# Patient Record
Sex: Female | Born: 1970 | Race: Black or African American | Hispanic: No | Marital: Married | Smoking: Never smoker
Health system: Southern US, Community
[De-identification: ages and names within clinical notes are randomized; demographics above are authoritative.]

## PROBLEM LIST (undated history)

## (undated) DIAGNOSIS — K589 Irritable bowel syndrome without diarrhea: Secondary | ICD-10-CM

## (undated) DIAGNOSIS — M329 Systemic lupus erythematosus, unspecified: Secondary | ICD-10-CM

## (undated) DIAGNOSIS — R51 Headache: Secondary | ICD-10-CM

## (undated) DIAGNOSIS — R531 Weakness: Secondary | ICD-10-CM

## (undated) DIAGNOSIS — F419 Anxiety disorder, unspecified: Secondary | ICD-10-CM

## (undated) DIAGNOSIS — Z973 Presence of spectacles and contact lenses: Secondary | ICD-10-CM

## (undated) DIAGNOSIS — M1990 Unspecified osteoarthritis, unspecified site: Secondary | ICD-10-CM

## (undated) DIAGNOSIS — K219 Gastro-esophageal reflux disease without esophagitis: Secondary | ICD-10-CM

## (undated) DIAGNOSIS — R7989 Other specified abnormal findings of blood chemistry: Secondary | ICD-10-CM

## (undated) DIAGNOSIS — R011 Cardiac murmur, unspecified: Secondary | ICD-10-CM

## (undated) DIAGNOSIS — M2550 Pain in unspecified joint: Secondary | ICD-10-CM

## (undated) DIAGNOSIS — D509 Iron deficiency anemia, unspecified: Secondary | ICD-10-CM

## (undated) DIAGNOSIS — I493 Ventricular premature depolarization: Secondary | ICD-10-CM

## (undated) DIAGNOSIS — I10 Essential (primary) hypertension: Secondary | ICD-10-CM

## (undated) DIAGNOSIS — Z87442 Personal history of urinary calculi: Secondary | ICD-10-CM

## (undated) HISTORY — DX: Gastro-esophageal reflux disease without esophagitis: K21.9

## (undated) HISTORY — PX: TUBAL LIGATION: SHX77

## (undated) HISTORY — DX: Ventricular premature depolarization: I49.3

## (undated) HISTORY — PX: OVARIAN CYST SURGERY: SHX726

## (undated) HISTORY — DX: Essential (primary) hypertension: I10

## (undated) HISTORY — DX: Unspecified osteoarthritis, unspecified site: M19.90

## (undated) HISTORY — DX: Other specified abnormal findings of blood chemistry: R79.89

## (undated) HISTORY — DX: Headache: R51

## (undated) HISTORY — DX: Anxiety disorder, unspecified: F41.9

## (undated) HISTORY — PX: OTHER SURGICAL HISTORY: SHX169

## (undated) HISTORY — DX: Cardiac murmur, unspecified: R01.1

## (undated) HISTORY — PX: ESOPHAGOGASTRODUODENOSCOPY: SHX1529

---

## 1994-04-04 HISTORY — PX: LAPAROSCOPIC ABDOMINAL EXPLORATION: SHX6249

## 1997-12-16 ENCOUNTER — Other Ambulatory Visit: Admission: RE | Admit: 1997-12-16 | Discharge: 1997-12-16 | Payer: Self-pay | Admitting: Obstetrics and Gynecology

## 1998-07-05 ENCOUNTER — Emergency Department (HOSPITAL_COMMUNITY): Admission: EM | Admit: 1998-07-05 | Discharge: 1998-07-05 | Payer: Self-pay | Admitting: Emergency Medicine

## 1998-08-08 ENCOUNTER — Emergency Department (HOSPITAL_COMMUNITY): Admission: EM | Admit: 1998-08-08 | Discharge: 1998-08-08 | Payer: Self-pay | Admitting: Emergency Medicine

## 1998-08-08 ENCOUNTER — Encounter: Payer: Self-pay | Admitting: Emergency Medicine

## 1998-09-17 ENCOUNTER — Observation Stay (HOSPITAL_COMMUNITY): Admission: AD | Admit: 1998-09-17 | Discharge: 1998-09-18 | Payer: Self-pay | Admitting: Obstetrics and Gynecology

## 1998-09-25 ENCOUNTER — Encounter: Payer: Self-pay | Admitting: Obstetrics and Gynecology

## 1998-09-25 ENCOUNTER — Inpatient Hospital Stay (HOSPITAL_COMMUNITY): Admission: AD | Admit: 1998-09-25 | Discharge: 1998-09-25 | Payer: Self-pay | Admitting: Obstetrics & Gynecology

## 1998-10-01 ENCOUNTER — Inpatient Hospital Stay (HOSPITAL_COMMUNITY): Admission: AD | Admit: 1998-10-01 | Discharge: 1998-10-01 | Payer: Self-pay | Admitting: Obstetrics & Gynecology

## 1998-10-02 ENCOUNTER — Inpatient Hospital Stay (HOSPITAL_COMMUNITY): Admission: AD | Admit: 1998-10-02 | Discharge: 1998-10-02 | Payer: Self-pay | Admitting: Obstetrics and Gynecology

## 1998-10-12 ENCOUNTER — Inpatient Hospital Stay (HOSPITAL_COMMUNITY): Admission: AD | Admit: 1998-10-12 | Discharge: 1998-10-18 | Payer: Self-pay | Admitting: Obstetrics & Gynecology

## 1999-02-04 ENCOUNTER — Inpatient Hospital Stay (HOSPITAL_COMMUNITY): Admission: AD | Admit: 1999-02-04 | Discharge: 1999-02-04 | Payer: Self-pay | Admitting: Obstetrics and Gynecology

## 1999-03-19 ENCOUNTER — Inpatient Hospital Stay (HOSPITAL_COMMUNITY): Admission: AD | Admit: 1999-03-19 | Discharge: 1999-03-22 | Payer: Self-pay | Admitting: Obstetrics and Gynecology

## 1999-03-23 ENCOUNTER — Encounter: Admission: RE | Admit: 1999-03-23 | Discharge: 1999-06-21 | Payer: Self-pay | Admitting: Obstetrics and Gynecology

## 1999-04-22 ENCOUNTER — Other Ambulatory Visit: Admission: RE | Admit: 1999-04-22 | Discharge: 1999-04-22 | Payer: Self-pay | Admitting: Obstetrics and Gynecology

## 1999-05-13 ENCOUNTER — Inpatient Hospital Stay (HOSPITAL_COMMUNITY): Admission: AD | Admit: 1999-05-13 | Discharge: 1999-05-13 | Payer: Self-pay | Admitting: Obstetrics and Gynecology

## 1999-05-14 ENCOUNTER — Encounter (HOSPITAL_COMMUNITY): Admission: RE | Admit: 1999-05-14 | Discharge: 1999-08-12 | Payer: Self-pay | Admitting: Obstetrics and Gynecology

## 1999-07-22 ENCOUNTER — Encounter: Admission: RE | Admit: 1999-07-22 | Discharge: 1999-10-15 | Payer: Self-pay | Admitting: Obstetrics and Gynecology

## 2000-06-15 ENCOUNTER — Other Ambulatory Visit: Admission: RE | Admit: 2000-06-15 | Discharge: 2000-06-15 | Payer: Self-pay | Admitting: Obstetrics and Gynecology

## 2001-07-02 ENCOUNTER — Other Ambulatory Visit: Admission: RE | Admit: 2001-07-02 | Discharge: 2001-07-02 | Payer: Self-pay | Admitting: Obstetrics and Gynecology

## 2001-08-13 ENCOUNTER — Encounter: Admission: RE | Admit: 2001-08-13 | Discharge: 2001-08-13 | Payer: Self-pay | Admitting: Urology

## 2001-08-13 ENCOUNTER — Encounter: Payer: Self-pay | Admitting: Urology

## 2001-10-26 ENCOUNTER — Encounter: Payer: Self-pay | Admitting: Obstetrics and Gynecology

## 2001-10-26 ENCOUNTER — Inpatient Hospital Stay (HOSPITAL_COMMUNITY): Admission: AD | Admit: 2001-10-26 | Discharge: 2001-10-26 | Payer: Self-pay | Admitting: Obstetrics and Gynecology

## 2001-10-29 ENCOUNTER — Inpatient Hospital Stay (HOSPITAL_COMMUNITY): Admission: AD | Admit: 2001-10-29 | Discharge: 2001-10-29 | Payer: Self-pay | Admitting: Obstetrics and Gynecology

## 2001-11-05 ENCOUNTER — Encounter: Payer: Self-pay | Admitting: Obstetrics and Gynecology

## 2001-11-06 ENCOUNTER — Inpatient Hospital Stay (HOSPITAL_COMMUNITY): Admission: AD | Admit: 2001-11-06 | Discharge: 2001-11-09 | Payer: Self-pay | Admitting: Obstetrics and Gynecology

## 2001-11-27 ENCOUNTER — Observation Stay (HOSPITAL_COMMUNITY): Admission: AD | Admit: 2001-11-27 | Discharge: 2001-11-27 | Payer: Self-pay | Admitting: Obstetrics and Gynecology

## 2001-11-30 ENCOUNTER — Observation Stay (HOSPITAL_COMMUNITY): Admission: AD | Admit: 2001-11-30 | Discharge: 2001-12-01 | Payer: Self-pay | Admitting: Obstetrics and Gynecology

## 2001-12-11 ENCOUNTER — Inpatient Hospital Stay (HOSPITAL_COMMUNITY): Admission: AD | Admit: 2001-12-11 | Discharge: 2001-12-17 | Payer: Self-pay | Admitting: Obstetrics and Gynecology

## 2002-04-01 ENCOUNTER — Ambulatory Visit (HOSPITAL_COMMUNITY): Admission: RE | Admit: 2002-04-01 | Discharge: 2002-04-01 | Payer: Self-pay | Admitting: Obstetrics and Gynecology

## 2002-04-01 ENCOUNTER — Encounter: Payer: Self-pay | Admitting: Obstetrics and Gynecology

## 2002-04-19 ENCOUNTER — Encounter: Payer: Self-pay | Admitting: Obstetrics and Gynecology

## 2002-04-19 ENCOUNTER — Inpatient Hospital Stay (HOSPITAL_COMMUNITY): Admission: AD | Admit: 2002-04-19 | Discharge: 2002-04-19 | Payer: Self-pay | Admitting: Obstetrics and Gynecology

## 2002-04-20 ENCOUNTER — Inpatient Hospital Stay (HOSPITAL_COMMUNITY): Admission: AD | Admit: 2002-04-20 | Discharge: 2002-04-20 | Payer: Self-pay | Admitting: Obstetrics and Gynecology

## 2002-04-26 ENCOUNTER — Inpatient Hospital Stay (HOSPITAL_COMMUNITY): Admission: AD | Admit: 2002-04-26 | Discharge: 2002-04-26 | Payer: Self-pay | Admitting: Obstetrics and Gynecology

## 2002-04-26 ENCOUNTER — Encounter: Payer: Self-pay | Admitting: Obstetrics and Gynecology

## 2002-04-28 ENCOUNTER — Encounter: Payer: Self-pay | Admitting: Obstetrics and Gynecology

## 2002-04-28 ENCOUNTER — Inpatient Hospital Stay (HOSPITAL_COMMUNITY): Admission: AD | Admit: 2002-04-28 | Discharge: 2002-04-28 | Payer: Self-pay | Admitting: Obstetrics and Gynecology

## 2002-05-03 ENCOUNTER — Inpatient Hospital Stay (HOSPITAL_COMMUNITY): Admission: AD | Admit: 2002-05-03 | Discharge: 2002-05-04 | Payer: Self-pay | Admitting: Obstetrics and Gynecology

## 2002-05-03 ENCOUNTER — Encounter: Payer: Self-pay | Admitting: Obstetrics and Gynecology

## 2002-05-04 ENCOUNTER — Encounter: Payer: Self-pay | Admitting: Obstetrics and Gynecology

## 2002-05-21 ENCOUNTER — Inpatient Hospital Stay (HOSPITAL_COMMUNITY): Admission: AD | Admit: 2002-05-21 | Discharge: 2002-05-21 | Payer: Self-pay | Admitting: Obstetrics and Gynecology

## 2002-05-29 ENCOUNTER — Inpatient Hospital Stay (HOSPITAL_COMMUNITY): Admission: AD | Admit: 2002-05-29 | Discharge: 2002-05-29 | Payer: Self-pay | Admitting: Obstetrics and Gynecology

## 2002-05-31 ENCOUNTER — Inpatient Hospital Stay (HOSPITAL_COMMUNITY): Admission: AD | Admit: 2002-05-31 | Discharge: 2002-06-03 | Payer: Self-pay | Admitting: Obstetrics and Gynecology

## 2002-06-01 ENCOUNTER — Encounter (INDEPENDENT_AMBULATORY_CARE_PROVIDER_SITE_OTHER): Payer: Self-pay

## 2002-06-04 ENCOUNTER — Encounter: Admission: RE | Admit: 2002-06-04 | Discharge: 2002-07-04 | Payer: Self-pay | Admitting: Obstetrics and Gynecology

## 2002-06-05 ENCOUNTER — Inpatient Hospital Stay (HOSPITAL_COMMUNITY): Admission: AD | Admit: 2002-06-05 | Discharge: 2002-06-05 | Payer: Self-pay | Admitting: Obstetrics and Gynecology

## 2002-07-05 ENCOUNTER — Encounter: Admission: RE | Admit: 2002-07-05 | Discharge: 2002-08-04 | Payer: Self-pay | Admitting: Obstetrics and Gynecology

## 2002-08-02 ENCOUNTER — Other Ambulatory Visit: Admission: RE | Admit: 2002-08-02 | Discharge: 2002-08-02 | Payer: Self-pay | Admitting: Obstetrics and Gynecology

## 2002-09-04 ENCOUNTER — Encounter: Admission: RE | Admit: 2002-09-04 | Discharge: 2002-10-04 | Payer: Self-pay | Admitting: Obstetrics and Gynecology

## 2003-10-13 ENCOUNTER — Ambulatory Visit (HOSPITAL_COMMUNITY): Admission: RE | Admit: 2003-10-13 | Discharge: 2003-10-13 | Payer: Self-pay | Admitting: Obstetrics and Gynecology

## 2003-10-13 ENCOUNTER — Encounter (INDEPENDENT_AMBULATORY_CARE_PROVIDER_SITE_OTHER): Payer: Self-pay | Admitting: Specialist

## 2004-02-09 ENCOUNTER — Other Ambulatory Visit: Admission: RE | Admit: 2004-02-09 | Discharge: 2004-02-09 | Payer: Self-pay | Admitting: Obstetrics and Gynecology

## 2006-01-19 ENCOUNTER — Other Ambulatory Visit: Admission: RE | Admit: 2006-01-19 | Discharge: 2006-01-19 | Payer: Self-pay | Admitting: Obstetrics and Gynecology

## 2006-02-27 ENCOUNTER — Encounter: Admission: RE | Admit: 2006-02-27 | Discharge: 2006-02-27 | Payer: Self-pay | Admitting: Internal Medicine

## 2006-03-14 ENCOUNTER — Ambulatory Visit (HOSPITAL_COMMUNITY): Admission: RE | Admit: 2006-03-14 | Discharge: 2006-03-14 | Payer: Self-pay | Admitting: Gastroenterology

## 2006-04-10 ENCOUNTER — Ambulatory Visit (HOSPITAL_COMMUNITY): Admission: RE | Admit: 2006-04-10 | Discharge: 2006-04-10 | Payer: Self-pay | Admitting: Gastroenterology

## 2006-04-10 ENCOUNTER — Encounter (INDEPENDENT_AMBULATORY_CARE_PROVIDER_SITE_OTHER): Payer: Self-pay | Admitting: *Deleted

## 2007-03-20 ENCOUNTER — Emergency Department (HOSPITAL_COMMUNITY): Admission: EM | Admit: 2007-03-20 | Discharge: 2007-03-20 | Payer: Self-pay | Admitting: Emergency Medicine

## 2007-03-25 ENCOUNTER — Emergency Department (HOSPITAL_COMMUNITY): Admission: EM | Admit: 2007-03-25 | Discharge: 2007-03-25 | Payer: Self-pay | Admitting: Emergency Medicine

## 2007-08-28 ENCOUNTER — Encounter: Payer: Self-pay | Admitting: Family Medicine

## 2007-09-11 ENCOUNTER — Encounter: Payer: Self-pay | Admitting: Family Medicine

## 2007-09-14 ENCOUNTER — Encounter: Payer: Self-pay | Admitting: Family Medicine

## 2007-09-19 ENCOUNTER — Encounter: Payer: Self-pay | Admitting: Family Medicine

## 2007-10-02 ENCOUNTER — Ambulatory Visit: Payer: Self-pay | Admitting: Family Medicine

## 2007-10-02 DIAGNOSIS — R002 Palpitations: Secondary | ICD-10-CM | POA: Insufficient documentation

## 2007-10-02 DIAGNOSIS — N840 Polyp of corpus uteri: Secondary | ICD-10-CM | POA: Insufficient documentation

## 2007-10-02 DIAGNOSIS — M1990 Unspecified osteoarthritis, unspecified site: Secondary | ICD-10-CM | POA: Insufficient documentation

## 2007-10-02 DIAGNOSIS — Z8679 Personal history of other diseases of the circulatory system: Secondary | ICD-10-CM | POA: Insufficient documentation

## 2007-10-02 DIAGNOSIS — K219 Gastro-esophageal reflux disease without esophagitis: Secondary | ICD-10-CM | POA: Insufficient documentation

## 2007-10-02 DIAGNOSIS — Z87442 Personal history of urinary calculi: Secondary | ICD-10-CM | POA: Insufficient documentation

## 2007-10-02 DIAGNOSIS — G249 Dystonia, unspecified: Secondary | ICD-10-CM | POA: Insufficient documentation

## 2007-10-02 DIAGNOSIS — D649 Anemia, unspecified: Secondary | ICD-10-CM | POA: Insufficient documentation

## 2007-10-02 DIAGNOSIS — K828 Other specified diseases of gallbladder: Secondary | ICD-10-CM | POA: Insufficient documentation

## 2007-10-02 DIAGNOSIS — R51 Headache: Secondary | ICD-10-CM | POA: Insufficient documentation

## 2007-10-02 DIAGNOSIS — I10 Essential (primary) hypertension: Secondary | ICD-10-CM | POA: Insufficient documentation

## 2007-10-02 DIAGNOSIS — R519 Headache, unspecified: Secondary | ICD-10-CM | POA: Insufficient documentation

## 2007-10-16 ENCOUNTER — Ambulatory Visit: Payer: Self-pay | Admitting: Family Medicine

## 2007-12-25 ENCOUNTER — Ambulatory Visit: Payer: Self-pay | Admitting: Family Medicine

## 2007-12-25 DIAGNOSIS — F39 Unspecified mood [affective] disorder: Secondary | ICD-10-CM | POA: Insufficient documentation

## 2007-12-25 DIAGNOSIS — M79609 Pain in unspecified limb: Secondary | ICD-10-CM | POA: Insufficient documentation

## 2007-12-27 ENCOUNTER — Encounter: Payer: Self-pay | Admitting: Family Medicine

## 2008-01-05 ENCOUNTER — Emergency Department (HOSPITAL_COMMUNITY): Admission: EM | Admit: 2008-01-05 | Discharge: 2008-01-06 | Payer: Self-pay | Admitting: Emergency Medicine

## 2008-01-07 ENCOUNTER — Telehealth: Payer: Self-pay | Admitting: Family Medicine

## 2008-01-08 ENCOUNTER — Ambulatory Visit: Payer: Self-pay | Admitting: Family Medicine

## 2008-01-08 DIAGNOSIS — J069 Acute upper respiratory infection, unspecified: Secondary | ICD-10-CM | POA: Insufficient documentation

## 2008-01-08 DIAGNOSIS — R079 Chest pain, unspecified: Secondary | ICD-10-CM | POA: Insufficient documentation

## 2008-02-01 ENCOUNTER — Ambulatory Visit: Payer: Self-pay | Admitting: Family Medicine

## 2008-02-01 DIAGNOSIS — M940 Chondrocostal junction syndrome [Tietze]: Secondary | ICD-10-CM | POA: Insufficient documentation

## 2008-07-01 ENCOUNTER — Ambulatory Visit: Payer: Self-pay | Admitting: Family Medicine

## 2008-07-01 DIAGNOSIS — M545 Low back pain, unspecified: Secondary | ICD-10-CM | POA: Insufficient documentation

## 2008-07-01 LAB — CONVERTED CEMR LAB
Blood in Urine, dipstick: NEGATIVE
Specific Gravity, Urine: 1.02
pH: 7

## 2009-03-10 ENCOUNTER — Ambulatory Visit: Payer: Self-pay | Admitting: Family Medicine

## 2009-03-10 DIAGNOSIS — K589 Irritable bowel syndrome without diarrhea: Secondary | ICD-10-CM | POA: Insufficient documentation

## 2009-03-24 ENCOUNTER — Encounter: Admission: RE | Admit: 2009-03-24 | Discharge: 2009-03-24 | Payer: Self-pay | Admitting: Family Medicine

## 2009-04-23 ENCOUNTER — Ambulatory Visit: Payer: Self-pay | Admitting: Family Medicine

## 2009-04-28 LAB — CONVERTED CEMR LAB
ALT: 14 units/L (ref 0–35)
Basophils Absolute: 0 10*3/uL (ref 0.0–0.1)
Creatinine, Ser: 0.6 mg/dL (ref 0.4–1.2)
GFR calc non Af Amer: 143.59 mL/min (ref >60)
HDL: 49.8 mg/dL (ref >39.00)
Hemoglobin: 11.6 g/dL — ABNORMAL LOW (ref 12.0–15.0)
Lymphocytes Relative: 33.3 % (ref 12.0–46.0)
MCHC: 33 g/dL (ref 30.0–36.0)
MCV: 96.1 fL (ref 78.0–100.0)
Monocytes Absolute: 0.4 10*3/uL (ref 0.1–1.0)
Monocytes Relative: 7.8 % (ref 3.0–12.0)
Neutro Abs: 3.3 10*3/uL (ref 1.4–7.7)
Neutrophils Relative %: 58.1 % (ref 43.0–77.0)
RBC: 3.66 M/uL — ABNORMAL LOW (ref 3.87–5.11)
RDW: 11.8 % (ref 11.5–14.6)
TSH: 1.83 microintl units/mL (ref 0.35–5.50)
Total CHOL/HDL Ratio: 3
Total Protein: 7.7 g/dL (ref 6.0–8.3)
VLDL: 7.4 mg/dL (ref 0.0–40.0)

## 2009-07-06 ENCOUNTER — Encounter: Admission: RE | Admit: 2009-07-06 | Discharge: 2009-07-06 | Payer: Self-pay | Admitting: Obstetrics and Gynecology

## 2009-08-07 ENCOUNTER — Telehealth: Payer: Self-pay | Admitting: Family Medicine

## 2009-10-13 ENCOUNTER — Ambulatory Visit: Payer: Self-pay | Admitting: Family Medicine

## 2009-10-13 DIAGNOSIS — R1013 Epigastric pain: Secondary | ICD-10-CM | POA: Insufficient documentation

## 2009-10-14 ENCOUNTER — Telehealth (INDEPENDENT_AMBULATORY_CARE_PROVIDER_SITE_OTHER): Payer: Self-pay | Admitting: *Deleted

## 2010-01-04 ENCOUNTER — Telehealth: Payer: Self-pay | Admitting: Family Medicine

## 2010-01-19 ENCOUNTER — Telehealth: Payer: Self-pay | Admitting: Family Medicine

## 2010-01-20 ENCOUNTER — Ambulatory Visit: Payer: Self-pay | Admitting: Family Medicine

## 2010-01-20 DIAGNOSIS — T783XXA Angioneurotic edema, initial encounter: Secondary | ICD-10-CM | POA: Insufficient documentation

## 2010-01-22 LAB — CONVERTED CEMR LAB
ALT: 11 units/L (ref 0–35)
AST: 15 units/L (ref 0–37)
Albumin: 3.8 g/dL (ref 3.5–5.2)
BUN: 14 mg/dL (ref 6–23)
Basophils Absolute: 0 10*3/uL (ref 0.0–0.1)
Chloride: 107 meq/L (ref 96–112)
Eosinophils Absolute: 0 10*3/uL (ref 0.0–0.7)
GFR calc non Af Amer: 132.77 mL/min (ref >60)
Glucose, Bld: 76 mg/dL (ref 70–99)
HCT: 36.8 % (ref 36.0–46.0)
Hemoglobin: 12.5 g/dL (ref 12.0–15.0)
Lymphs Abs: 1.4 10*3/uL (ref 0.7–4.0)
MCHC: 33.8 g/dL (ref 30.0–36.0)
Monocytes Absolute: 0.5 10*3/uL (ref 0.1–1.0)
Platelets: 206 10*3/uL (ref 150.0–400.0)
Potassium: 4 meq/L (ref 3.5–5.1)
RDW: 13.1 % (ref 11.5–14.6)
Sodium: 141 meq/L (ref 135–145)
Total Bilirubin: 0.4 mg/dL (ref 0.3–1.2)

## 2010-02-11 ENCOUNTER — Ambulatory Visit: Payer: Self-pay | Admitting: Family Medicine

## 2010-02-12 ENCOUNTER — Telehealth (INDEPENDENT_AMBULATORY_CARE_PROVIDER_SITE_OTHER): Payer: Self-pay | Admitting: *Deleted

## 2010-02-12 ENCOUNTER — Telehealth: Payer: Self-pay | Admitting: Family Medicine

## 2010-02-15 ENCOUNTER — Ambulatory Visit: Payer: Self-pay

## 2010-02-15 ENCOUNTER — Encounter: Payer: Self-pay | Admitting: Family Medicine

## 2010-02-15 ENCOUNTER — Encounter: Payer: Self-pay | Admitting: Cardiovascular Disease

## 2010-02-17 ENCOUNTER — Telehealth (INDEPENDENT_AMBULATORY_CARE_PROVIDER_SITE_OTHER): Payer: Self-pay | Admitting: *Deleted

## 2010-02-18 ENCOUNTER — Telehealth: Payer: Self-pay | Admitting: Family Medicine

## 2010-02-19 ENCOUNTER — Ambulatory Visit: Payer: Self-pay | Admitting: Family Medicine

## 2010-02-23 ENCOUNTER — Encounter: Payer: Self-pay | Admitting: Family Medicine

## 2010-04-25 ENCOUNTER — Encounter: Payer: Self-pay | Admitting: Family Medicine

## 2010-05-02 LAB — CONVERTED CEMR LAB
CO2: 30 meq/L (ref 19–32)
Creatinine, Ser: 0.7 mg/dL (ref 0.4–1.2)
Eosinophils Absolute: 0 10*3/uL (ref 0.0–0.7)
Lymphocytes Relative: 25.6 % (ref 12.0–46.0)
MCHC: 33.7 g/dL (ref 30.0–36.0)
MCV: 97.4 fL (ref 78.0–100.0)
Monocytes Absolute: 0.5 10*3/uL (ref 0.1–1.0)
Monocytes Relative: 8.8 % (ref 3.0–12.0)
Neutro Abs: 4 10*3/uL (ref 1.4–7.7)
Neutrophils Relative %: 64.6 % (ref 43.0–77.0)
Platelets: 218 10*3/uL (ref 150.0–400.0)
WBC: 6.2 10*3/uL (ref 4.5–10.5)

## 2010-05-06 NOTE — Assessment & Plan Note (Signed)
Summary: nausea/dm   Vital Signs:  Patient profile:   40 year old female Weight:      135 pounds Temp:     98.5 degrees F oral BP sitting:   90 / 60  (left arm) Cuff size:   regular  Vitals Entered By: Raechel Ache, RN (October 13, 2009 10:42 AM) CC: C/o nausea, diarrhea and feeling sluggish since Saturday.   History of Present Illness: Here for one week of intermittent epigastric burning pains, loose stools, and nausea without vomitting. No fever. She has a hx of GERD and has Protonix, but she only takes this once or twice a month.   Allergies: 1)  ! Demerol 2)  ! Reglan 3)  ! Codeine 4)  ! Darvocet 5)  ! Percocet 6)  ! Imitrex (Sumatriptan Succinate)  Past History:  Past Medical History: Reviewed history from 04/23/2009 and no changes required. Anemia-NOS GERD Headache Hypertension Nephrolithiasis, hx of Osteoarthritis Seizure disorder, had some after she was treated with Demerol prior to surgery 10 yrs ago, none since Polyps in uterus & gallbladder Heart murmur as a child, resolved palpitations, PVC's  sees Dr. Esperanza Richters  for gyn exams IBS  Past Surgical History: Reviewed history from 10/02/2007 and no changes required. 2 ovarian cysts removed at age 65 exploratory laparoscopy for abdominal pain, diagnosed with IBS EGD for GERD  Review of Systems  The patient denies anorexia, fever, weight loss, weight gain, vision loss, decreased hearing, hoarseness, chest pain, syncope, dyspnea on exertion, peripheral edema, prolonged cough, headaches, hemoptysis, melena, hematochezia, severe indigestion/heartburn, hematuria, incontinence, genital sores, muscle weakness, suspicious skin lesions, transient blindness, difficulty walking, depression, unusual weight change, abnormal bleeding, enlarged lymph nodes, angioedema, breast masses, and testicular masses.    Physical Exam  General:  Well-developed,well-nourished,in no acute distress; alert,appropriate and  cooperative throughout examination Neck:  No deformities, masses, or tenderness noted. Lungs:  Normal respiratory effort, chest expands symmetrically. Lungs are clear to auscultation, no crackles or wheezes. Heart:  Normal rate and regular rhythm. S1 and S2 normal without gallop, murmur, click, rub or other extra sounds. Abdomen:  Bowel sounds positive,abdomen soft and non-tender without masses, organomegaly or hernias noted.   Impression & Recommendations:  Problem # 1:  EPIGASTRIC PAIN (ICD-789.06)  Complete Medication List: 1)  Protonix 40 Mg Tbec (Pantoprazole sodium) .... Once daily 2)  Inderal La 80 Mg Cp24 (Propranolol hcl) .... Once daily 3)  Mirena 20 Mcg/24hr Iud (Levonorgestrel) 4)  Miralax Powd (Polyethylene glycol 3350) .... Once daily 5)  Klor-con 10 10 Meq Cr-tabs (Potassium chloride) .Marland Kitchen.. 1 by mouth once daily 6)  Benicar Hct 40-25 Mg Tabs (Olmesartan medoxomil-hctz) .Marland Kitchen.. 1 once daily 7)  Promethazine Hcl 25 Mg Tabs (Promethazine hcl) .Marland Kitchen.. 1 q 4 hours as needed nausea  Patient Instructions: 1)  This is consistent with gastritis. Advised her to take Protonix every day. 2)  Please schedule a follow-up appointment as needed .  Prescriptions: PROMETHAZINE HCL 25 MG TABS (PROMETHAZINE HCL) 1 q 4 hours as needed nausea  #30 x 2   Entered and Authorized by:   Nelwyn Salisbury MD   Signed by:   Nelwyn Salisbury MD on 10/13/2009   Method used:   Electronically to        [facility] [address] (retail)       [phone]       [place], [place]  [address]  Ph: [phone]       Fax: [phone]   RxID:   [id number]

## 2010-05-06 NOTE — Progress Notes (Signed)
Summary: REQ FOR GENERIC RX (BENICAR/HCTZ  and  PROTONIX)  Phone Note Call from Patient   Caller:   Patient   [phone] Complaint: Urinary/GYN Problems Summary of Call: Pt called to speak with RN or Dr Clent Ridges.... Pt adv that she needs to have a Rx for generic med (for benicar/hctz med) and Rx for generic med (Protonix).... Pt had to switch to [employer] and they will not cover brand name.... Pt req that Rx for generic med be sent to:  [facility].   Follow-up for Phone Call        okay, switch to Omeprazole 40 mg a day and Losartan HCT 100/25 once daily . Call in one year of each  Follow-up by: Nelwyn Salisbury MD,  January 05, 2010 8:39 AM    New/Updated Medications: OMEPRAZOLE 40 MG CPDR (OMEPRAZOLE) 1 daily LOSARTAN POTASSIUM-HCTZ 100-12.5 MG TABS (LOSARTAN POTASSIUM-HCTZ) 1 daily Prescriptions: LOSARTAN POTASSIUM-HCTZ 100-12.5 MG TABS (LOSARTAN POTASSIUM-HCTZ) 1 daily  #30 x 11   Entered by:   Josph Macho RMA   Authorized by:   Nelwyn Salisbury MD   Signed by:   Josph Macho RMA on 01/05/2010   Method used:   Electronically to        [facility] [address] (retail)       [phone]       [place], [place]  [address]       Ph: [phone]       Fax: [phone]   RxID:   [id number] OMEPRAZOLE 40 MG CPDR (OMEPRAZOLE) 1 daily  #30 x 11   Entered by:   Josph Macho RMA   Authorized by:   Nelwyn Salisbury MD   Signed by:   Josph Macho RMA on 01/05/2010   Method used:   Electronically to        [facility] [address] (retail)       [phone]       [place], [place]  [address]       Ph: [phone]       Fax: [phone]   RxID:   [id number]  Left message on vm stating that meds where changed to generic and sent to [facility] [address]

## 2010-05-06 NOTE — Progress Notes (Signed)
Summary: having reaction to cozaar  Phone Note Call from Patient Call back at Home Phone [phone]   Caller: [facility] mail Reason for Call: Talk to Nurse Summary of Call: bp meds were changed to cozaar. she is still having a lot of swelling and muscle cramps and constipation. Her lips are splitting on the top. Please advise. Initial call taken by: Warnell Forester,  January 19, 2010 12:47 PM  Follow-up for Phone Call        she needs an OV to discuss this  Follow-up by: Nelwyn Salisbury MD,  January 19, 2010 1:18 PM  Additional Follow-up for Phone Call Additional follow up Details #1::        pt will come in tomorrow @ 8:45 am, as indicated above. Additional Follow-up by: Warnell Forester,  January 19, 2010 2:52 PM

## 2010-05-06 NOTE — Progress Notes (Signed)
Summary: concerned about BP readings  Phone Note Call from Patient Call back at Home Phone [phone]   Caller: Patient Call For: Gina Summary of Call: Please call pt regarding her BP readings.......she had to come home from work today due to low readings after med change. Does not know any readings. Initial call taken by: [facility] CMA AAMA,  February 18, 2010 9:50 AM  Follow-up for Phone Call        spoke with pt no BP readings  to report  states on metoprolol 2 tabs am and 2 tabs pm  states got hot sweaty and felt dizzy. pt home now and states she is still dizzy lying or standing. Also, stated she is still on Norvasc 5mg  . pt requesting anxiety med.  call to [facility] [address].   SEE ALLERGY LIST Follow-up by: Pura Spice, RN,  February 18, 2010 10:11 AM  Additional Follow-up for Phone Call Additional follow up Details #1::        I need to see her before we add any new meds to her regimen. make an appt. for tomorrow if possible  Additional Follow-up by: Nelwyn Salisbury MD,  February 18, 2010 1:24 PM    Additional Follow-up for Phone Call Additional follow up Details #2::    spoke with pt appt for this am at 10:30 with Dr  Clent Ridges Follow-up by: Pura Spice, RN,  February 19, 2010 8:29 AM

## 2010-05-06 NOTE — Procedures (Signed)
Summary: summary report  summary report   Imported By: Mirna Mires 02/17/2010 09:58:50  _____________________________________________________________________  External Attachment:    Type:   Image     Comment:   External Document

## 2010-05-06 NOTE — Procedures (Signed)
Summary: Summary Report  Summary Report   Imported By: Erle Crocker 02/24/2010 15:47:18  _____________________________________________________________________  External Attachment:    Type:   Image     Comment:   External Document

## 2010-05-06 NOTE — Assessment & Plan Note (Signed)
Summary: discuss bp meds per dr fry//ccm   Vital Signs:  Patient profile:   40 year old female Weight:      147 pounds O2 Sat:      98 % Temp:     99 degrees F Pulse rate:   78 / minute BP sitting:   132 / 92  (left arm)  Vitals Entered By: Pura Spice, RN (January 20, 2010 8:56 AM) CC: bp not working alot cramps lips swelling and cracking c/o fatigue   History of Present Illness: Here for side effects to Losartan HCT over the past 3 weeks including swelling in the hands, feet, and face. She has had diffuse muscle cramps also.   Allergies: 1)  ! Demerol 2)  ! Reglan 3)  ! Codeine 4)  ! Darvocet 5)  ! Percocet 6)  ! Imitrex (Sumatriptan Succinate) 7)  ! * Losartan  Past History:  Past Medical History: Reviewed history from 04/23/2009 and no changes required. Anemia-NOS GERD Headache Hypertension Nephrolithiasis, hx of Osteoarthritis Seizure disorder, had some after she was treated with Demerol prior to surgery 10 yrs ago, none since Polyps in uterus & gallbladder Heart murmur as a child, resolved palpitations, PVC's  sees Dr. Esperanza Richters  for gyn exams IBS  Review of Systems  The patient denies anorexia, fever, weight loss, weight gain, vision loss, decreased hearing, hoarseness, chest pain, syncope, dyspnea on exertion, prolonged cough, headaches, hemoptysis, abdominal pain, melena, hematochezia, severe indigestion/heartburn, hematuria, incontinence, genital sores, muscle weakness, suspicious skin lesions, transient blindness, difficulty walking, depression, unusual weight change, abnormal bleeding, enlarged lymph nodes, angioedema, breast masses, and testicular masses.    Physical Exam  General:  Well-developed,well-nourished,in no acute distress; alert,appropriate and cooperative throughout examination Mouth:  slight edema of the lips Neck:  No deformities, masses, or tenderness noted. Lungs:  Normal respiratory effort, chest expands symmetrically. Lungs  are clear to auscultation, no crackles or wheezes. Heart:  Normal rate and regular rhythm. S1 and S2 normal without gallop, murmur, click, rub or other extra sounds. Extremities:  trace left pedal edema and trace right pedal edema.     Impression & Recommendations:  Problem # 1:  HYPERTENSION (ICD-401.9)  The following medications were removed from the medication list:    Inderal La 80 Mg Cp24 (Propranolol hcl) ..... Once daily    Losartan Potassium-hctz 100-12.5 Mg Tabs (Losartan potassium-hctz) .Marland Kitchen... 1 daily Her updated medication list for this problem includes:    Furosemide 40 Mg Tabs (Furosemide) ..... Once daily    Amlodipine Besylate 5 Mg Tabs (Amlodipine besylate) ..... 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 1 daily 2)  Mirena 20 Mcg/24hr Iud (Levonorgestrel) 3)  Miralax Powd (Polyethylene glycol 3350) .... Once daily 4)  Klor-con 10 10 Meq Cr-tabs (Potassium chloride) .Marland Kitchen.. 1 by mouth once daily 5)  Promethazine Hcl 25 Mg Tabs (Promethazine hcl) .Marland Kitchen.. 1 q 4 hours as needed nausea 6)  Furosemide 40 Mg Tabs (Furosemide) .... Once daily 7)  Amlodipine Besylate 5 Mg Tabs (Amlodipine besylate) .... Once daily  Patient Instructions: 1)  Switch to Amlodipine and Lasix. get labs today 2)  Please schedule a follow-up appointment in 2 weeks.  Prescriptions: AMLODIPINE BESYLATE 5 MG TABS (AMLODIPINE BESYLATE) once daily  #30 x 5  Entered and Authorized by:   Nelwyn Salisbury MD   Signed by:   Nelwyn Salisbury MD on 01/20/2010   Method used:   Electronically to        [facility] [address] (retail)       [phone]       [place], [place]  [address]       Ph: [phone]       Fax: [phone]   RxID:   [id number] FUROSEMIDE 40 MG TABS (FUROSEMIDE) once daily  #30 x 5   Entered and Authorized by:   Nelwyn Salisbury MD   Signed by:   Nelwyn Salisbury MD on 01/20/2010   Method used:   Electronically to        [facility] [address] (retail)       [phone]       [place], [place]  [address]       Ph: [phone]       Fax: [phone]   RxID:   [id number]    Orders Added: 1)  Venipuncture [30160] 2)  TLB-BMP (Basic Metabolic Panel-BMET) [80048-METABOL] 3)  TLB-CBC Platelet - w/Differential [85025-CBCD] 4)  TLB-Hepatic/Liver Function Pnl [80076-HEPATIC] 5)  TLB-TSH (Thyroid Stimulating Hormone) [84443-TSH] 6)  Est. Patient Level IV [109[address]]

## 2010-05-06 NOTE — Progress Notes (Signed)
Summary: FYI  Phone Note Call from Patient Call back at [facility] Phone [phone]   Summary of Call: Patient is calling wandering about blood work? Please advise?  Follow-up for Phone Call        All labs were normal, including the one for clotting.  Please see if she's feeling any better on the metoprolol, and ask how many she has taken. Follow-up by: Michell Heinrich M.D.,  February 12, 2010 11:59 AM  Additional Follow-up for Phone Call Additional follow up Details #1::        Pt informed. Pt states she has taken and feels better than yesterday. Pt states she only has a little SOB today. Additional Follow-up by: Josph Macho RMA,  February 12, 2010 12:02 PM

## 2010-05-06 NOTE — Progress Notes (Signed)
Summary: Holter monitor  Phone Note Outgoing Call Call back at Home Phone [phone]   Action Taken: Appt scheduled Summary of Call: Pt has appt for Monday 02/15/10 for holter monitor Initial call taken by: Marcos Eke,  February 12, 2010 1:37 PM

## 2010-05-06 NOTE — Progress Notes (Signed)
Summary: out of work note  Phone Note Call from Patient Call back at [employer] [phone]   Summary of Call: Also had to stay out of work today & wants to come by in am to pickup note for out of work.  Please call me if possible.  Want to try to go back to work tomorrow.   Initial call taken by: Rudy Jew, RN,  October 14, 2009 5:04 PM  Follow-up for Phone Call        written. Follow-up by: Raechel Ache, RN,  October 14, 2009 5:12 PM

## 2010-05-06 NOTE — Progress Notes (Signed)
Summary: HCTZ ADDED TO BENICAR  Phone Note Call from Patient Call back at Home Phone [phone]   Caller: Patient Call For: Nelwyn Salisbury MD Summary of Call: PT NEEDS HCTZ ADDED TO [facility] DUE TO SWELLING AGAIN CALL INTO [facility] [address] Initial call taken by: Heron Sabins,  Aug 07, 2009 12:01 PM  Follow-up for Phone Call        Rx Called In Follow-up by: Nelwyn Salisbury MD,  Aug 07, 2009 3:49 PM    New/Updated Medications: BENICAR HCT 40-25 MG TABS (OLMESARTAN MEDOXOMIL-HCTZ) 1 once daily Prescriptions: BENICAR HCT 40-25 MG TABS (OLMESARTAN MEDOXOMIL-HCTZ) 1 once daily  #30 x 11   Entered by:   Raechel Ache, RN   Authorized by:   Nelwyn Salisbury MD   Signed by:   Raechel Ache, RN on 08/07/2009   Method used:   Electronically to        [facility] [address] (retail)       [phone]       [place], [place]  [address]       Ph: [phone]       Fax: [phone]   RxID:   [id number]

## 2010-05-06 NOTE — Assessment & Plan Note (Signed)
Summary: palpitations/dm   Vital Signs:  Patient profile:   40 year old female Height:      61.5 inches (156.21 cm) Weight:      147 pounds (66.82 kg) BMI:     27.42 O2 Sat:      99 % on Room air Temp:     98.5 degrees F (36.94 degrees C) oral Pulse rate:   109 / minute BP sitting:   132 / 88  (left arm) Cuff size:   regular  Vitals Entered By: Josph Macho RMA (February 11, 2010 10:34 AM)  O2 Flow:  Room air CC: Palpitations X1 week, SOB, pressure in chest/ CF Is Patient Diabetic? No   History of Present Illness: 40 y/o AAF with long history of palpitations presents with worsening of these palpitations over the last 1 wk.  Usually has avg of one episode of palpitations per day, but has had several per day for a week, then the last two days she has had continuous feeling of palpitations/rapid heart beat, breathlessness, some pressure over left side of chest intermittently, mild nausea, flushing.  Worse when lying supine and when walking. Anxiety is not triggering this.  She drinks no caffeinated drinks, takes no otc decongestants. ROS: no cough, no ST, no fever, no abd pain, no vomiting or diarrhea or rash. Some left lower leg pain anteriorly.  No asymmetric leg swelling.  Not a smoker or drinker. Has had some cardiac w/u for palpitations and CP in distant past per her report.  To her knowledge, no abnormalities were found. Of note, she is NOT on mirena anymore, otherwise med list is accurate.  She has had a BTL. No FH of clotting disorder.  Current Medications (verified): 1)  Omeprazole 40 Mg Cpdr (Omeprazole) .Marland Kitchen.. 1 Daily 2)  Mirena 20 Mcg/24hr Iud (Levonorgestrel) 3)  Miralax  Powd (Polyethylene Glycol 3350) .... Once Daily 4)  Klor-Con 10 10 Meq Cr-Tabs (Potassium Chloride) .Marland Kitchen.. 1 By Mouth Once Daily 5)  Promethazine Hcl 25 Mg Tabs (Promethazine Hcl) .Marland Kitchen.. 1 Q 4 Hours As Needed Nausea 6)  Furosemide 40 Mg Tabs (Furosemide) .... Once Daily 7)  Amlodipine Besylate 5 Mg Tabs  (Amlodipine Besylate) .... Once Daily  Allergies (verified): 1)  ! Demerol 2)  ! Reglan 3)  ! Codeine 4)  ! Darvocet 5)  ! Percocet 6)  ! Imitrex (Sumatriptan Succinate) 7)  ! * Losartan  Past History:  Family History: Last updated: 04/23/2009 Family History of Arthritis Family History High cholesterol Family History Hypertension (including her daughter) Family History of Stroke M 1st degree relative <50  Risk Factors: Exercise: no (10/02/2007)  Past medical, surgical, family and social histories (including risk factors) reviewed, and no changes noted (except as noted below).  Past Medical History: Reviewed history from 04/23/2009 and no changes required. Anemia-NOS GERD Headache Hypertension Nephrolithiasis, hx of Osteoarthritis Seizure disorder, had some after she was treated with Demerol prior to surgery 10 yrs ago, none since Polyps in uterus & gallbladder Heart murmur as a child, resolved palpitations, PVC's  sees Dr. Esperanza Richters  for gyn exams IBS  Past Surgical History: Reviewed history from 10/02/2007 and no changes required. 2 ovarian cysts removed at age 43 exploratory laparoscopy for abdominal pain, diagnosed with IBS EGD for GERD  Family History: Reviewed history from 04/23/2009 and no changes required. Family History of Arthritis Family History High cholesterol Family History Hypertension (including her daughter) Family History of Stroke M 1st degree relative <50  Social History: Reviewed  history from 10/02/2007 and no changes required. Married Never Smoked Alcohol use-no Drug use-no Regular exercise-no Occupation: Charity fundraiser  Review of Systems       see HPI  Physical Exam  General:  VS: noted, all normal. Gen: Alert, mildly anxious appearing but NAD, oriented x 4. HEENT: Scalp without lesions or hair loss.  Ears: EACs clear, normal epithelium.  TMs with good light reflex and landmarks bilaterally.  Eyes: no injection, icteris, swelling,  or exudate.  EOMI, PERRLA. Nose: no drainage or turbinate edema/swelling.  No inection or focal lesion.  Mouth: lips without lesion/swelling.  Oral mucosa pink and moist.  Dentition intact and without obvious caries or gingival swelling.  Oropharynx without erythema, exudate, or swelling.  Neck: supple.  No lymphadenopathy, thyromegaly, or mass. Chest: symmetric expansion, with nonlabored respirations.  Clear and equal breath sounds in all lung fields.   CV: Regular rhythm, tachy to 120s, no m/r/g.  Peripheral pulses 2+/symmetric. EXT: no clubbing, cyanosis, or edema.      Impression & Recommendations:  Problem # 1:  PALPITATIONS (ICD-785.1) Assessment Deteriorated Could be symptomatic sinus tachycardia, but need to further evaluate for dysrhythmia given severity/acute worsening. Will do 24h holter, check lytes, TSH, and d-dimer. Start metoprolol 50mg  once daily, may take an additional tab daily as needed for worsening palp's. Recheck in 1 wk.     Orders: T-D-Dimer Fibrin Derivatives Quantitive [id number]) TLB-BMP (Basic Metabolic Panel-BMET) (80048-METABOL) TLB-CBC Platelet - w/Differential (85025-CBCD) TLB-TSH (Thyroid Stimulating Hormone) (84443-TSH) Holter (Holter) EKG w/ Interpretation (93000) Venipuncture (32355) Specimen Handling (73220)  Complete Medication List: 1)  Omeprazole 40 Mg Cpdr (Omeprazole) .Marland Kitchen.. 1 daily 2)  Miralax Powd (Polyethylene glycol 3350) .... Once daily 3)  Klor-con 10 10 Meq Cr-tabs (Potassium chloride) .Marland Kitchen.. 1 by mouth once daily 4)  Promethazine Hcl 25 Mg Tabs (Promethazine hcl) .Marland Kitchen.. 1 q 4 hours as needed nausea 5)  Furosemide 40 Mg Tabs (Furosemide) .... Once daily 6)  Amlodipine Besylate 5 Mg Tabs (Amlodipine besylate) .... Once daily 7)  Metoprolol Tartrate 50 Mg Tabs (Metoprolol tartrate) .Marland Kitchen.. 1 tab by mouth once daily, may take an additional tab once daily as needed for palpitations.  Patient Instructions: 1)  Please schedule a follow-up  appointment in 1 week. Prescriptions: METOPROLOL TARTRATE 50 MG TABS (METOPROLOL TARTRATE) 1 tab by mouth once daily, may take an additional tab once daily as needed for palpitations.  #60 x 1   Entered and Authorized by:   Michell Heinrich M.D.   Signed by:   Michell Heinrich M.D. on 02/11/2010   Method used:   Electronically to        [facility] [address] (retail)       [phone]       [place], [place]  [address]       Ph: [phone]       Fax: [phone]   RxID:   [id number]    Orders Added: 1)  T-D-Dimer Fibrin Derivatives Quantitive [[id number]] 2)  TLB-BMP (Basic Metabolic Panel-BMET) [80048-METABOL] 3)  TLB-CBC Platelet - w/Differential [85025-CBCD] 4)  TLB-TSH (Thyroid Stimulating Hormone) [84443-TSH] 5)  Holter [Holter] 6)  EKG w/ Interpretation [93000] 7)  Venipuncture [36415] 8)  Specimen Handling [99000] 9)  Est. Patient Level IV [96789]

## 2010-05-06 NOTE — Letter (Signed)
Summary: Attending Physician Statement Monia Pouch  Attending Physician Statement Monia Pouch   Imported By: Maryln Gottron 03/03/2010 11:24:20  _____________________________________________________________________  External Attachment:    Type:   Image     Comment:   External Document

## 2010-05-06 NOTE — Assessment & Plan Note (Signed)
Summary: cpx/no pap/pt will come in fasting/njr   Vital Signs:  Patient profile:   40 year old female Height:      61.5 inches Weight:      137 pounds BMI:     25.56 Temp:     98.0 degrees F oral Pulse rate:   117 / minute BP sitting:   122 / 84  (left arm) Cuff size:   regular  Vitals Entered By: Alfred Levins, CMA (April 23, 2009 10:13 AM) CC: cpx, fasting, no pap   History of Present Illness: 40 yr old female for cpx. She has several issues to disucss. First we saw her last month for IBS with predominant constipation. An abdominal US at that time was normal. Since starting on Miralax, she feels much better and her BMs are more regular. She also tells me that for severla months she often feels very tired, her mouth stays dry, and she does not seem to urinate as much as she used to. Her BP is stable and often a bit too low. She still has heavy periods, and she and Dr. Vickey Sages are considering doing a hysterectomy.   Current Medications (verified): 1)  Protonix 40 Mg  Tbec (Pantoprazole Sodium) .... Once Daily 2)  Benicar Hct 40-25 Mg  Tabs (Olmesartan Medoxomil-Hctz) .... Once Daily 3)  Inderal La 80 Mg  Cp24 (Propranolol Hcl) .... Once Daily 4)  Mirena 20 Mcg/24hr Iud (Levonorgestrel) 5)  Miralax  Powd (Polyethylene Glycol 3350) .... Once Daily  Allergies (verified): 1)  ! Demerol 2)  ! Reglan 3)  ! Codeine 4)  ! Darvocet 5)  ! Percocet 6)  ! Imitrex (Sumatriptan Succinate)  Past History:  Past Medical History: Anemia-NOS GERD Headache Hypertension Nephrolithiasis, hx of Osteoarthritis Seizure disorder, had some after she was treated with Demerol prior to surgery 10 yrs ago, none since Polyps in uterus & gallbladder Heart murmur as a child, resolved palpitations, PVC's  sees Dr. Esperanza Richters  for gyn exams IBS  Past Surgical History: Reviewed history from 10/02/2007 and no changes required. 2 ovarian cysts removed at age 51 exploratory laparoscopy for  abdominal pain, diagnosed with IBS EGD for GERD  Family History: Reviewed history from 10/02/2007 and no changes required. Family History of Arthritis Family History High cholesterol Family History Hypertension (including her daughter) Family History of Stroke M 1st degree relative <50  Social History: Reviewed history from 10/02/2007 and no changes required. Married Never Smoked Alcohol use-no Drug use-no Regular exercise-no Occupation: Charity fundraiser  Review of Systems  The patient denies anorexia, fever, weight loss, weight gain, vision loss, decreased hearing, hoarseness, chest pain, syncope, dyspnea on exertion, peripheral edema, prolonged cough, headaches, hemoptysis, abdominal pain, melena, hematochezia, severe indigestion/heartburn, hematuria, incontinence, genital sores, muscle weakness, suspicious skin lesions, transient blindness, difficulty walking, depression, unusual weight change, abnormal bleeding, enlarged lymph nodes, angioedema, breast masses, and testicular masses.    Physical Exam  General:  Well-developed,well-nourished,in no acute distress; alert,appropriate and cooperative throughout examination Head:  Normocephalic and atraumatic without obvious abnormalities. No apparent alopecia or balding. Eyes:  No corneal or conjunctival inflammation noted. EOMI. Perrla. Funduscopic exam benign, without hemorrhages, exudates or papilledema. Vision grossly normal. Ears:  External ear exam shows no significant lesions or deformities.  Otoscopic examination reveals clear canals, tympanic membranes are intact bilaterally without bulging, retraction, inflammation or discharge. Hearing is grossly normal bilaterally. Nose:  External nasal examination shows no deformity or inflammation. Nasal mucosa are pink and moist without lesions or exudates. Mouth:  Oral mucosa and oropharynx without lesions or exudates.  Teeth in good repair. Neck:  No deformities, masses, or tenderness noted. Chest  Wall:  No deformities, masses, or tenderness noted. Lungs:  Normal respiratory effort, chest expands symmetrically. Lungs are clear to auscultation, no crackles or wheezes. Heart:  Normal rate and regular rhythm. S1 and S2 normal without gallop, murmur, click, rub or other extra sounds. Abdomen:  Bowel sounds positive,abdomen soft and non-tender without masses, organomegaly or hernias noted. Msk:  No deformity or scoliosis noted of thoracic or lumbar spine.   Pulses:  R and L carotid,radial,femoral,dorsalis pedis and posterior tibial pulses are full and equal bilaterally Extremities:  No clubbing, cyanosis, edema, or deformity noted with normal full range of motion of all joints.   Neurologic:  No cranial nerve deficits noted. Station and gait are normal. Plantar reflexes are down-going bilaterally. DTRs are symmetrical throughout. Sensory, motor and coordinative functions appear intact. Skin:  Intact without suspicious lesions or rashes Cervical Nodes:  No lymphadenopathy noted Axillary Nodes:  No palpable lymphadenopathy Inguinal Nodes:  No significant adenopathy Psych:  Cognition and judgment appear intact. Alert and cooperative with normal attention span and concentration. No apparent delusions, illusions, hallucinations   Impression & Recommendations:  Problem # 1:  HEALTH MAINTENANCE EXAM (ICD-V70.0)  Orders: UA Dipstick w/o Micro (automated)  (81003) Venipuncture (40981) TLB-Lipid Panel (80061-LIPID) TLB-BMP (Basic Metabolic Panel-BMET) (80048-METABOL) TLB-CBC Platelet - w/Differential (85025-CBCD) TLB-Hepatic/Liver Function Pnl (80076-HEPATIC) TLB-TSH (Thyroid Stimulating Hormone) (84443-TSH)  Complete Medication List: 1)  Protonix 40 Mg Tbec (Pantoprazole sodium) .... Once daily 2)  Inderal La 80 Mg Cp24 (Propranolol hcl) .... Once daily 3)  Mirena 20 Mcg/24hr Iud (Levonorgestrel) 4)  Miralax Powd (Polyethylene glycol 3350) .... Once daily 5)  Benicar 40 Mg Tabs (Olmesartan  medoxomil) .... Once daily  Patient Instructions: 1)  Get labs. She seems to be a bit dehydrated, so we will take the diuretic portion out of her BP meds.  Prescriptions: BENICAR 40 MG TABS (OLMESARTAN MEDOXOMIL) once daily  #30 x 11   Entered and Authorized by:   Nelwyn Salisbury MD   Signed by:   Nelwyn Salisbury MD on 04/23/2009   Method used:   Electronically to        [facility] [address] (retail)       [phone]       [place], [place]  [address]       Ph: [phone]       Fax: [phone]   RxID:   [id number]   Appended Document: cpx/no pap/pt will come in fasting/njr  Laboratory Results   Urine Tests    Routine Urinalysis   Color: yellow Appearance: Clear Glucose: negative   (Normal Range: Negative) Bilirubin: 1+   (Normal Range: Negative) Ketone: 2+   (Normal Range: Negative) Spec. Gravity: 1.025   (Normal Range: 1.003-1.035) Blood: 2+   (Normal Range: Negative) pH: 5.5   (Normal Range: 5.0-8.0) Protein: 1+   (Normal Range: Negative) Urobilinogen: 1.0   (Normal Range: 0-1) Nitrite: negative   (Normal Range: Negative) Leukocyte Esterace: negative   (Normal Range: Negative)    Comments: Rita Ohara  April 23, 2009 11:18 AM

## 2010-05-06 NOTE — Progress Notes (Signed)
Summary: Holter Monitor  Phone Note Other Incoming   Summary of Call: Please call and tell her that the holter monitor she wore showed lots of premature beats occurring, but no prolonged period of abnormal rhythm. She is on the right med for this--metoprolol--and she should be taking 2 tabs in the am and 2 tabs in the pm.  If this dosing does not help her feel MUCH better, then we should try a different med.  She should have f/u visit scheduled within the next couple of weeks for this (if not already done). Initial call taken by: Michell Heinrich M.D.,  February 17, 2010 12:30 PM  Follow-up for Phone Call        Patient informed. Follow-up by: Josph Macho RMA,  February 17, 2010 4:35 PM

## 2010-05-06 NOTE — Assessment & Plan Note (Signed)
Summary: bp issues/gh   Vital Signs:  Patient profile:   40 year old female Weight:      150 pounds O2 Sat:      95 % Temp:     98.6 degrees F Pulse rate:   67 / minute BP sitting:   130 / 80  (left arm)  Vitals Entered By: Pura Spice, RN (February 19, 2010 10:47 AM) CC: bp issues dizzy still get sob had holtor mointor and is on metoropol 2 am 2 pm in addition to amlopidine    History of Present Illness: Here to follow up HTN and palpitations. She was recently seen for palpitations which were shown on a Holter monitor to be simple PVCs. She was given Metoprolol to take in addition to her Lasix and Norvasc. her palpitations improved, but she began to feel weak and lightheaded, and her BP began to drop at home. For the past 2 days she has taken the Lasix and Metoprolol but has not taken the Norvasc, and now she feels back to normal. Her BP is stable and she feels fine.   Allergies: 1)  ! Demerol 2)  ! Reglan 3)  ! Codeine 4)  ! Darvocet 5)  ! Percocet 6)  ! Imitrex (Sumatriptan Succinate) 7)  ! * Losartan  Past History:  Past Medical History: Reviewed history from 04/23/2009 and no changes required. Anemia-NOS GERD Headache Hypertension Nephrolithiasis, hx of Osteoarthritis Seizure disorder, had some after she was treated with Demerol prior to surgery 10 yrs ago, none since Polyps in uterus & gallbladder Heart murmur as a child, resolved palpitations, PVC's  sees Dr. Esperanza Richters  for gyn exams IBS  Review of Systems  The patient denies anorexia, fever, weight loss, weight gain, vision loss, decreased hearing, hoarseness, chest pain, syncope, dyspnea on exertion, peripheral edema, prolonged cough, headaches, hemoptysis, abdominal pain, melena, hematochezia, severe indigestion/heartburn, hematuria, incontinence, genital sores, muscle weakness, suspicious skin lesions, transient blindness, difficulty walking, depression, unusual weight change, abnormal bleeding,  enlarged lymph nodes, angioedema, breast masses, and testicular masses.    Physical Exam  General:  Well-developed,well-nourished,in no acute distress; alert,appropriate and cooperative throughout examination Neck:  No deformities, masses, or tenderness noted. Lungs:  Normal respiratory effort, chest expands symmetrically. Lungs are clear to auscultation, no crackles or wheezes. Heart:  Normal rate and regular rhythm. S1 and S2 normal without gallop, murmur, click, rub or other extra sounds.   Impression & Recommendations:  Problem # 1:  PALPITATIONS (ICD-785.1)  The following medications were removed from the medication list:    Metoprolol Tartrate 50 Mg Tabs (Metoprolol tartrate) .Marland Kitchen... 1 tab by mouth once daily, may take an additional tab once daily as needed for palpitations. Her updated medication list for this problem includes:    Metoprolol Tartrate 100 Mg Tabs (Metoprolol tartrate) .Marland Kitchen..Marland Kitchen Two times a day  Problem # 2:  HYPERTENSION (ICD-401.9)  The following medications were removed from the medication list:    Amlodipine Besylate 5 Mg Tabs (Amlodipine besylate) ..... Once daily    Metoprolol Tartrate 50 Mg Tabs (Metoprolol tartrate) .Marland Kitchen... 1 tab by mouth once daily, may take an additional tab once daily as needed for palpitations. Her updated medication list for this problem includes:    Furosemide 40 Mg Tabs (Furosemide) ..... Once daily    Metoprolol Tartrate 100 Mg Tabs (Metoprolol tartrate) .Marland Kitchen..Marland Kitchen Two times a day  Complete Medication List: 1)  Omeprazole 40 Mg Cpdr (Omeprazole) .Marland Kitchen.. 1 daily 2)  Miralax Powd (Polyethylene glycol  3350) .... Once daily 3)  Klor-con 10 10 Meq Cr-tabs (Potassium chloride) .Marland Kitchen.. 1 by mouth once daily 4)  Promethazine Hcl 25 Mg Tabs (Promethazine hcl) .Marland Kitchen.. 1 q 4 hours as needed nausea 5)  Furosemide 40 Mg Tabs (Furosemide) .... Once daily 6)  Metoprolol Tartrate 100 Mg Tabs (Metoprolol tartrate) .... Two times a day 7)  Ativan 1 Mg Tabs  (Lorazepam) .... Three times a day as needed anxiety  Patient Instructions: 1)  Stay on Lasix and Metoprolol and stay off Norvasc.  2)  Please schedule a follow-up appointment in 3 months .  Prescriptions: ATIVAN 1 MG TABS (LORAZEPAM) three times a day as needed anxiety  #60 x 5   Entered and Authorized by:   Nelwyn Salisbury MD   Signed by:   Nelwyn Salisbury MD on 02/19/2010   Method used:   Print then Give to Patient   RxID:   [id number] METOPROLOL TARTRATE 100 MG TABS (METOPROLOL TARTRATE) two times a day  #60 x 11   Entered and Authorized by:   Nelwyn Salisbury MD   Signed by:   Nelwyn Salisbury MD on 02/19/2010   Method used:   Electronically to        [facility] [address] (retail)       [phone]       [place], [place]  [address]       Ph: [phone]       Fax: [phone]   RxID:   [id number]    Orders Added: 1)  Est. Patient Level IV [95638]

## 2010-05-16 ENCOUNTER — Other Ambulatory Visit: Payer: Self-pay | Admitting: Family Medicine

## 2010-07-12 ENCOUNTER — Ambulatory Visit (INDEPENDENT_AMBULATORY_CARE_PROVIDER_SITE_OTHER): Payer: BLUE CROSS/BLUE SHIELD | Admitting: Family Medicine

## 2010-07-12 ENCOUNTER — Encounter: Payer: Self-pay | Admitting: Family Medicine

## 2010-07-12 VITALS — BP 142/96 | HR 102 | Temp 98.7°F

## 2010-07-12 DIAGNOSIS — J40 Bronchitis, not specified as acute or chronic: Secondary | ICD-10-CM

## 2010-07-12 MED ORDER — AZITHROMYCIN 250 MG PO TABS: ORAL_TABLET | ORAL | Status: AC

## 2010-07-12 MED ORDER — HYDROCODONE-HOMATROPINE 5-1.5 MG/5ML PO SYRP: 5.0000 mL | ORAL_SOLUTION | ORAL | Status: AC | PRN

## 2010-07-12 NOTE — Progress Notes (Signed)
  Subjective:    Patient ID: Amy Barry, female    DOB: 12/15/1970, 40 y.o.   MRN: [id number]  HPI Here for one week of sinus pressure, PND, ST, and a dry cough. No fever.    Review of Systems  Constitutional: Negative.   HENT: Positive for congestion, postnasal drip and sinus pressure.   Eyes: Negative.   Respiratory: Positive for cough.        Objective:   Physical Exam  Constitutional: She appears well-developed and well-nourished.  HENT:  Head: Normocephalic and atraumatic.  Right Ear: External ear normal.  Left Ear: External ear normal.  Nose: Nose normal.  Mouth/Throat: Oropharynx is clear and moist. No oropharyngeal exudate.  Eyes: Conjunctivae are normal. Pupils are equal, round, and reactive to light.  Neck: Normal range of motion. Neck supple.  Pulmonary/Chest: Effort normal and breath sounds normal. No respiratory distress. She has no wheezes. She has no rales. She exhibits no tenderness.  Lymphadenopathy:    She has no cervical adenopathy.          Assessment & Plan:  Off work today and tomorrow

## 2010-08-17 NOTE — Assessment & Plan Note (Signed)
[facility]                                 ON-CALL NOTE   Amy Barry, Amy Barry                        MRN:          [id number]  DATE:01/06/2008                            DOB:          04-Nov-1970    HISTORY:  The patient is a patient of Dr. Clent Ridges.  She is feeling quite  weak and lightheaded now.  She does have a history of arrhythmia per her  report.  Unfortunately, Centricity is down now.  She does take  propranolol for what she describes as an irregular heart beat.  She does  also take 2 other blood pressure medications.  She states she feels as  if her heart is having palpitations currently and she is feeling quite  weak.   PLAN:  The patient is at home with her mother-in-law currently.  They do  live on [address] right near [facility] and I instructed them  to go there immediately for evaluation.  The patient agreed.  She was  not in any acute distress on the telephone and indicated she would go  there as soon as we got off the phone.     Juleen China, MD    STC/MedQ  DD: 01/06/2008  DT: 01/06/2008  Job #: [id number]

## 2010-08-20 NOTE — H&P (Signed)
NAME:  Amy Barry, Amy Barry                           ACCOUNT NO.:  [id number]   MEDICAL RECORD NO.:  [id number]                   PATIENT TYPE:  MAT   LOCATION:  [facility]                                 [facility]ACILITY:  [facility]   PHYSICIAN:  Concha Pyo. Duplantis, C.N.M.        DATE OF BIRTH:  03/10/1971   DATE OF PROCEDURE:  DATE OF DISCHARGE:                      STAT - MUST CHANGE TO CORRECT WORK TYPE   HISTORY OF PRESENT ILLNESS:  The patient is a 40 year old married black  female, gravida 3, para 1-0-1-1, at approximately seven weeks gestation with  hyperemesis.  She was seen earlier today at [facility] office  and was noted to have persistent nausea and vomiting that has failed to  respond to Phenergan, Reglan, and Zofran.  She was also noted to be  distraught and depressed at that time secondary to her recurring hyperemesis  as she has had severe hyperemesis with a previous pregnancy, and she reports  that she has had thoughts of either terminating the pregnancy and/or harming  herself.  She was then sent to Maternity Admissions at [facility] for  IV fluid initiation and ACT team consult.  She reports that this is an  unplanned pregnancy that occurred when she discontinued Ortho Evra secondary  to having elevated blood pressure by her primary physician.  Her pregnancy  has been followed at [facility] and has been complicated by:  1. Hyperemesis, for which she has received IV fluids already on July 25 and     October 29, 2001, and has tried Phenergan, Reglan, and Zofran without     success.  2. She has been recently diagnosed with a sinus infection, but she has been     unable to tolerate the prescription.  3. She has a history of migraines, none currently.  4. History of hypertension.  Is unable to take Aldomet currently.  5. History of kidney stones in the past.  6. Multiple allergies to medications.  They include DEMEROL, DARVOCET,     CODEINE, and IMITREX.  She  also reports a possible reaction to PHRENILIN     or PREDNISONE.  She took both of them together once for a migraine and     had extreme drowsiness and stopped breathing but has not taken the     medications separately since then.  She does report having taken     prednisone prior to that time without difficulty.   GYNECOLOGIC HISTORY:  She is gravida 3, para 1-0-1-1.  She had a miscarriage  in 1996, no complications.  In decreased 2002, she delivered a viable female  infant who weighed 5 pounds 14 ounces at [redacted] weeks gestation following an 8-  hour labor.  She was induced at that time secondary to extreme discomfort  and a very low vertex, 3+ station, 80% effaced.  She had no complications,  actually, with this delivery, though she reports significant complications  throughout her whole pregnancy  including placenta previa that resolved,  multiple hospitalizations for hyperemesis throughout that entire pregnancy.  She also reports a history of mild dysplasia, had a colposcopy, and Paps  were subsequently normal.  She has had a diagnostic laparoscopy to remove  two cysts from her right ovary when she was 13.  She reports that she is  positive for group B strep.   PAST MEDICAL HISTORY:  1. History of hypertension for which she has been on Aldomet until she was     unable to keep the medication down.  2. History of migraine headaches.  3. History of GERD and irritable bowel syndrome for which she has never     actually been seen by a physician.  4. She reports that she is difficult to wake up after surgery.  5. She also reports a history of seizures as a child.   PAST SURGICAL HISTORY:  1. Diagnostic laparoscopy when she had the cyst on her ovary.  2. Irritable bowel syndrome in 1999.   FAMILY HISTORY:  Significant for maternal uncle with MI.  Maternal three  uncles with hypertension.  Grandparents with hypertension.  Mother with  aneurysm.  Paternal grandmother with varicose veins.  A  sister with anemia.  A cousin with thyroid disease.  Paternal grandmother with rheumatoid  arthritis.  Maternal grandmother with leukemia.  Paternal grandmother with  stroke.  Sister with childhood epilepsy.   GENETIC HISTORY:  Essentially negative, although the patient reports a heart  murmur when she was born that closed up later.   SOCIAL HISTORY:  She is married to Garnetta Buddy who is involved and  supportive.  Also, her mother or aunt is involved and supportive.  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  Her husband also works full-time.  They  deny any illicit drug use, alcohol, or smoking with this pregnancy.   PRENATAL LABORATORY DATA:  Her prenatal laboratories were just drawn today  and, in addition, a TSH and a complete metabolic were also collected.  A  urinalysis shows today trace ketones.   PHYSICAL EXAMINATION:  VITAL SIGNS:  Stable.  She is afebrile.  CHEST:  Clear.  HEART:  Regular rhythm and rate.  BREASTS:  Soft and nontender.  ABDOMEN:  Soft and nontender also.  EXTREMITIES:  Within normal limits.  PELVIC:  Deferred at this time.   LABORATORY DATA:  As previously mentioned, her urinalysis shows negative  except for trace ketones.   She had an ultrasound on October 26, 2001, which showed an intrauterine  pregnancy at 6 weeks 1 day, with fetal heart tones.   ASSESSMENT:  1. Intrauterine pregnancy at approximately seven weeks.  2. Hyperemesis gravidarum.  3. Depression.   PLAN:  Per consult with Dr. Dierdre Forth, to admit for 23-hour  observation.  ACT team referral.  Gallbladder ultrasound.  GI consult.  IV  fluids and prednisone taper when tolerating p.o. medications.                                               Concha Pyo. Duplantis, C.N.M.    SJD/MEDQ  D:  11/05/2001  T:  11/05/2001  Job:  [id number]

## 2010-08-20 NOTE — Op Note (Signed)
NAME:  ALINDA, EGOLF                           ACCOUNT NO.:  [id number]   MEDICAL RECORD NO.:  [id number]                   PATIENT TYPE:  AMB   LOCATION:  [facility]                                  FACILITY:  [facility]   PHYSICIAN:  Crist Fat. Rivard, M.D.              DATE OF BIRTH:  Dec 17, 1970   DATE OF PROCEDURE:  10/13/2003  DATE OF DISCHARGE:                                 OPERATIVE REPORT   PREOPERATIVE DIAGNOSIS:  Metrorrhagia with endometrial polyp.   POSTOPERATIVE DIAGNOSIS:  Metrorrhagia with endometrial polyp.   ANESTHESIA:  General anesthesia.   PROCEDURE:  Hysteroscopy with resection of endometrial polyp and D&C.   SURGEON:  Crist Fat. Rivard, M.D.   ESTIMATED BLOOD LOSS:  Minimal.   DESCRIPTION OF PROCEDURE:  After being informed of the planned procedure  with possible complications including bleeding, infection, uterine  perforation requiring laparoscopy, laparotomy with possibility of intra-  abdominal organ injury, informed consent is obtained.  The patient is taken  to OR #2, given general anesthesia with laryngeal mask.  She is placed in  the lithotomy position, prepped and draped in sterile fashion and her  bladder is emptied with in and out Foley catheter.  Gynecologic examination  reveals an anteverted uterus, normal in size, two normal adnexae.  A  weighted speculum is inserted.  Anterior lip of the cervix is grasped with a  tenaculum forceps and we proceed with a paracervical block using 20 mL of  Marcaine 0.25 for postoperative pain control.   The uterus is sounded at 9 cm and cervix is dilated using Hegar dilators at  #31.  The operative hysteroscope is easily inserted and with Sorbitol 3% we  proceed with uterine perfusion at a maximum pressure of 100 mmHg.  This  allows [name] to see the entire endometrial cavity with the two tubal ostia and  to identify a 1 cm polyp in the lower third of the uterine body anterior  wall.  This is easily resected using the rectoscope  and sent separately.  Hysteroscope is then removed and we proceed with sharp curette to the  curettage of the endometrial cavity removing all endometrial lining which  appears normal.  At the end of the procedure, there is no active bleeding.  Instruments are removed.  Instrument and sponge counts are complete x2.  The  estimated blood loss is minimal.  The procedure is well tolerated by the  patient who is taken to the recovery room in a well and stable condition.  Water deficit is evaluated at 150 mL which appears to be above what I would  have estimated at about 75 mL.  Crist Fat Rivard, M.D.    SAR/MEDQ  D:  10/13/2003  T:  10/13/2003  Job:  [id number]

## 2010-08-20 NOTE — H&P (Signed)
NAME:  Amy Barry, Amy Barry                           ACCOUNT NO.:  [id number]   MEDICAL RECORD NO.:  [id number]                   PATIENT TYPE:  INP   LOCATION:  9152                                 FACILITY:  [facility]   PHYSICIAN:  Osborn Coho, M.D.                DATE OF BIRTH:  08-06-1970   DATE OF ADMISSION:  05/03/2002  DATE OF DISCHARGE:                                HISTORY & PHYSICAL   HISTORY OF PRESENT ILLNESS:  This is a 40 year old, gravida 3, para 0-1-1-1,  at 33-1/7 weeks, who presents for oligohydramnios which was seen on the  office ultrasound today.  No history of contractions, bleeding, or leaking.  The pregnancy has been followed by Dois Davenport A. Rivard, M.D., and remarkable  for:  1.  History of hypertension.  2.  Hyperemesis.  3.  GERD.  4.  History  of previa, which has now resolved.  5.  History of preterm delivery.  6.  History of migraines.  7.  History of kidney stones.  8.  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  9.  Severe depression for which she is not  currently taking her prescribed medications.   OBSTETRICAL HISTORY:  Remarkable for a missed AB with a D&E in 1996.  A  vaginal delivery in 2000 of a female infant at [redacted] weeks gestation weighing 5  pounds 14 ounces.  The patient was induced due to low station.  The patient  experienced a hemorrhage after delivery.   PAST MEDICAL HISTORY:  Remarkable for:  1. Hyperemesis in previous pregnancy and current pregnancy.  2. History of placenta previa with previous pregnancy.  3. History of postpartum depression.  4. History of abnormal Pap smear in the past.  5. History of ovarian cyst.  6. History of Trichomonas 15 years ago.  7. Childhood varicella.  8. History of hypertension for which she takes no medications.  9. History of irritable bowel syndrome and gastric reflux disease.  10.      History of kidney stones at age 48.  84.      History of seizures.  12.      History of migraine headaches.  13.       History of a heart murmur.  14.      She also experienced dystonic reaction to Reglan during this     pregnancy.   GENETIC HISTORY:  Remarkable for a patient who was born with a heart murmur  and a hole in her heart which closed up.  Daughter born with a heart murmur.  Father of the baby died from leukemia.   SOCIAL HISTORY:  The patient is married to Garnetta Buddy, who is involved and  supportive.  She does not report a religious affiliation.  She denies any  alcohol, tobacco, or drug use.   ALLERGIES:  DEMEROL, DARVOCET, PREDNISONE, CODEINE, IMITREX, and PHRENILIN.  OBJECTIVE DATA:  VITAL SIGNS:  Stable.  Afebrile.  HEENT:  Within normal limits.  NECK:  Thyroid normal.  Not enlarged.  CHEST:  Clear to auscultation.  HEART:  Regular rate and rhythm.  ABDOMEN:  Gravid at 32 cm.  Vertex to Leopold's.  EFM shows a reassuring  fetal heart rate with no uterine contractions.  PELVIC:  Cervix exam was deferred.   Ultrasound here at the hospital today showed a single pregnancy, heart rate  147, positive fetal movement, no fetal breathing seen, cephalic  presentation, posterior placenta which was grade 2, AFI 9.3, and cervix 2.2  cm long.  Uterine artery Dopplers of 2.8 and MCA 2.3.   ASSESSMENT:  1. Intrauterine pregnancy at 33-1/7 weeks.  2. Oligohydramnios.  3. Normal Dopplers.   PLAN:  1. Admit to hospital per Dois Davenport A. Rivard, M.D., and Osborn Coho, M.D.  2. IV hydration.  3. Bed rest.  4. Continuous fetal monitoring.  5. Repeat AFI and Dopplers on May 05, 2002.  6. Further orders to follow.     Marie L. Williams, C.N.M.                 Osborn Coho, M.D.    MLW/MEDQ  D:  05/03/2002  T:  05/03/2002  Job:  [id number]

## 2010-08-20 NOTE — Op Note (Signed)
NAME:  Amy Barry, Amy Barry                           ACCOUNT NO.:  [id number]   MEDICAL RECORD NO.:  [id number]                   PATIENT TYPE:  INP   LOCATION:  9120                                 [facility]ILITY[facility]   PHYSICIAN:  Osborn Coho, M.D.                DATE OF BIRTH:  [birth year]   DATE OF PROCEDURE:  06/01/2002  DATE OF DISCHARGE:                                 OPERATIVE REPORT   PREOPERATIVE DIAGNOSIS:  Desires permanent sterilization.   POSTOPERATIVE DIAGNOSIS:  Desires permanent sterilization.   PROCEDURE:  Postpartum bilateral tubal ligation.   ATTENDING:  Osborn Coho, M.D.   ANESTHESIA:  Epidural.   FLUIDS:  800 mL.   ESTIMATED BLOOD LOSS:  Minimal (less than 10 mL).   COMPLICATIONS:  None.   PATHOLOGY:  Portions of bilateral fallopian tubes sent.   FINDINGS:  Normal bilateral fallopian tubes carried to their fimbriated end.   PROCEDURE:  The patient was taken to the operating room after the risks,  benefits, and alternatives were discussed with the patient.  The patient  verbalized understanding and consent reaffirmed.  The patient was given a  surgical level via the epidural and in the dorsal supine position and  prepped and draped in a normal sterile fashion.  An approximately 10 mm  incision was made at the umbilicus and carried down to the underlying layer  of fascia and peritoneum, which was entered sharply with a scalpel.  The  right fallopian tube was identified and was carried out to its fimbriated  end.  0 plain ties were used to ligate the right fallopian tube in the  midportion after being carried out to its fimbriated end x2.  The  portion  of the tube was excised and handed off to be sent to pathology.  The  remaining portions of the fallopian tube were cauterized with the Bovie.  Attention was turned to the left fallopian tube, which was carried out to  its fimbriated end and grasped in the midportion with the Babcock.  0 plain  ties were  used to ligate the tube x2 in the midportion.  The tube was then  excised and handed off to be sent to pathology.  The tube was excised close  to the suture, and the suture was noted to be loose.  The fallopian tubes  were grasped.  The remaining portions of the fallopian tube were grasped  individually, and the mesosalpinx was grasped as well.  A tie was placed  around the three pedicles.  Another 0 plain tie was placed just beneath this  one.  Hemostasis was noted.  The remaining portions of the fallopian tube  were cauterized with the Bovie.  The tube was returned to the intra-  abdominal cavity.  The fascia was repaired with 0 Vicryl in a running  fashion.  The skin was closed with 4-0 Monocryl using a subcuticular  stitch.  Sponge, lap, and needle counts were correct.  The patient tolerated the  procedure well and was returned to the recovery room in a stable condition.                                               Osborn Coho, M.D.    AR/MEDQ  D:  06/01/2002  T:  06/01/2002  Job:  [id number]

## 2010-08-20 NOTE — H&P (Signed)
NAME:  Amy Barry, Amy Barry                           ACCOUNT NO.:  [id number]   MEDICAL RECORD NO.:  [id number]                   PATIENT TYPE:  OBV   LOCATION:  9308                                 [facility]   PHYSICIAN:  Naima A. Dillard, M.D.              DATE OF BIRTH:  1970/06/20   DATE OF ADMISSION:  11/30/2001  DATE OF DISCHARGE:                                HISTORY & PHYSICAL   HISTORY OF PRESENT ILLNESS:  The patient is a 40 year old married black  female, gravida 3, para 1-0-1-1 at [redacted] weeks gestation by LMP and early  ultrasound.  She presents for fourth evaluation at [facility] for  hyperemesis.  She presented to the office for her routine OB visit this  morning, and was found to have 2+ ketones and reports being unable to  tolerate anything p.o. for the last two days.  She had her last dose of IV  fluids on November 27, 2001 and felt better at that time and able to go home.  However, she continues to feel unable to tolerate anything at this point,  and finds no relief from Reglan or the p.r.n. Zofran that she takes.  She,  however, does not take Zofran regularly because she says that it makes her  drowsy.  She was admitted approximately 2-3 weeks ago and received a  prednisone taper, and had some benefit from that.  She also has a history of  situational depression in recent months, subsequent to her child's health  and other issues.  As a result of her situational issues, she is also seeing  a Veterinary surgeon.   OBSTETRICAL/GYNECOLOGIC HISTORY:  She is a gravid 3, para 1-0-1-1 who has  one living child, who is approximately two years old.  She delivered with no  complications, but did have severe hyperemesis with that pregnancy also and  was induced at approximately 36 weeks.  She has had one miscarriage.   ALLERGIES:  PERCOCET, DARVOCET, IMITREX, DEMEROL, CODEINE, PREDNISONE  (though the prednisone was not given to her by itself, and she thinks she  was really  allergic to something else that she was taking at the same time.  She has subsequently had prednisone with no problems.).   GENERAL MEDICAL HISTORY:  Essentially noncontributory.   FAMILY HISTORY:  Noncontributory.   SOCIAL HISTORY:  She is married and reports that her family is involved and  supportive.   CURRENT MEDICATIONS:  1. Protonix 40 mg p.o. q.d.  2. Reglan 10 mg p.o. q.6h.  3. Zofran p.o. 4 mg p.o. q.6h. p.r.n.  She last took those both yesterday at 6 p.m.   PAST SURGICAL HISTORY:  1. Laparoscopy for ovarian cyst removal.  2. History of irritable bowel syndrome.   PHYSICAL EXAMINATION:  VITAL SIGNS:  Temperature 98.5, pulse 86,  respirations 16, blood pressure 122/72.  CHEST:  Clear.  BREASTS:  Soft and nontender.  ABDOMEN:  Occasional abdominal cramps but is otherwise soft and nontender.  Fetal heart tones are in the 150's.  EXTREMITIES:  Within normal limits.   LABORATORY DATA:  Urinalysis -- 2+ ketones at the office earlier today.   ASSESSMENT:  1. Intrauterine pregnancy at approximately [redacted] weeks gestation.  2. Hyperemesis gravidarum.  3. Situational depression.   PLAN:  Admit her to 23 hr observation to allow placement of the Zofran pump  and to see how she tolerates fluids prior to her discharge to home.      Concha Pyo. Duplantis, C.N.M.              Naima A. Normand Sloop, M.D.    SJD/MEDQ  D:  11/30/2001  T:  12/01/2001  Job:  [id number]

## 2010-08-20 NOTE — Discharge Summary (Signed)
NAME:  Amy Barry, Amy Barry                           ACCOUNT NO.:  [id number]   MEDICAL RECORD NO.:  [id number]                   PATIENT TYPE:  INP   LOCATION:  9309                                 [facility]   PHYSICIAN:  Naima A. Dillard, M.D.              DATE OF BIRTH:  11-14-1970   DATE OF ADMISSION:  12/10/2001  DATE OF DISCHARGE:  12/17/2001                                 DISCHARGE SUMMARY   ADMISSION DIAGNOSES:  1. Intrauterine pregnancy at 82 and 5/7 weeks.  2. Recurrent hyperemesis with moderate dehydration.  3. Situational depression.   DISCHARGE DIAGNOSES:  1. Intrauterine pregnancy at 62 and 5/7 weeks.  2. Improved hyperemesis.  3. Situational depression.   PROCEDURE:  None.   HOSPITAL COURSE:  The patient is a 40 year old married black female gravida  3, para 1-0-1-1 at 13 and 5/7 weeks on admission who presented complaining  of being unable to keep anything down for the last several days.  She was  started on IV fluids and felt some better the following day, but  subsequently on hospital day two felt that it was getting worse again.  She  had not tolerated any even liquids since admission.  She was on the Reglan  pump at home and was then suggested to continue with the Reglan pump to add  Zofran ODT and to continue on Protonix.  She continued with this modality  for the next two days with minimal improvement.  She had some diarrhea on  December 13, 2001 and continued nausea and vomiting.  A stool culture was  collected and other electrolytes were collected which were normal.  The  stool culture, however, is still pending.  On hospital day four she was  still not feeling any better and continued to vomit every time she moved.  It was then realized that she had never been started on Protonix since her  admission and had not kept any of her Medrol taper down from the 11th.  It  was then recommended that she become n.p.o., that she be started on all her  medications IV, to give her Medrol IV, Protonix IV, and continue her Reglan  pump.  She was also given multivitamins through her IV of D5-1/2 normal  saline.  By the subsequent day of December 15, 2001 she was feeling much  better.  Actuall had felt hungry and had a smile on her face for the first  time in this hospitalization.  She was able to keep a very bland diet down  and she was recommended to discontinue her IV and to start her medications  p.o. to see if she could manage taking them p.o. and be able to go home.  She kept her medications and food down late on the day of December 16, 2001  and throughout the day on December 17, 2001 and now desires to be  discharged  to home.  Since she is tolerating a bland diet, liquids, and her  medications, it is deemed that she is able to go home today.   DISCHARGE INSTRUCTIONS:  She is to call with recurrence of nausea and  vomiting, vaginal bleeding, fever, or other problems.   DISCHARGE MEDICATIONS:  1. Medrol taper.  2. Reglan pump.  3. Zofran ODT.  4. Protonix 40 mg p.o. q.d.  5. Motrin as needed for cramping.   DISCHARGE LABORATORIES:  Sodium 133, potassium 3.8, albumin 2.9, AST 29, ALT  less than 19.  Her urine was completely negative.   DISCHARGE FOLLOWUP:  In one week at [facility].  She is to call  to make that appointment with Dr. Estanislado Pandy who she has elected to be her  primary physician unless she is improved sufficiently to become a midwife  patient.     Concha Pyo. Duplantis, C.N.M.              Naima A. Normand Sloop, M.D.    SJD/MEDQ  D:  12/17/2001  T:  12/17/2001  Job:  [id number]

## 2010-08-20 NOTE — Discharge Summary (Signed)
   NAME:  Amy Barry, Amy Barry                           ACCOUNT NO.:  [id number]   MEDICAL RECORD NO.:  [id number]                   PATIENT TYPE:  INP   LOCATION:  9152                                 [facility]   PHYSICIAN:  Osborn Coho, M.D.                DATE OF BIRTH:  Mar 30, 1971   DATE OF ADMISSION:  05/03/2002  DATE OF DISCHARGE:  05/04/2002                                 DISCHARGE SUMMARY   ADMISSION DIAGNOSES:  Oligohydramnios with nonreactive nonstress test.   DISCHARGE DIAGNOSES:  Oligohydramnios and biophysical profile initially 8/8,  and actually right 8/8 of C/C.   HOSPITAL COURSE:  The patient was admitted on May 03, 2002.  She is a 40-  year-old gravida 3, para 1 at 33-1/7 weeks.  She presented to the office for  NST, which was found to be nonreactive.  A biophysical profile was performed  that showed oligohydramnios in the office.  The patient was admitted for  observation, IV hydration and Dopplers on ultrasound with follow-up  biophysical profile the following day.   The patient's Dopplers were normal, and the AFI was 9.3 cm (greater than 5th  percentile).  The following day the patient's biophysical profile was 8/8.   The patient was doing well, was normotensive and fetal heart rate tracing  was reactive.  The patient was discharged to home in good condition.  During  hospitalization the patient had some occasional irregular contractions,  which were controlled with terbutaline.   DISCHARGE MEDICATIONS:  Terbutaline 2.5 mg p.o. at home.   DISCHARGE INSTRUCTIONS:  Limited activity, increased fluid intake and fetal  TIC counts.   FOLLOW UP:  The patient was scheduled to follow up in the office within one  week.                                                Osborn Coho, M.D.    AR/MEDQ  D:  07/15/2002  T:  07/15/2002  Job:  [id number]

## 2010-08-20 NOTE — Discharge Summary (Signed)
   NAME:  Amy Barry, Amy Barry                           ACCOUNT NO.:  [id number]   MEDICAL RECORD NO.:  [id number]                   PATIENT TYPE:  OBV   LOCATION:  9309                                 [facility]   PHYSICIAN:  Crist Fat. Rivard, M.D.              DATE OF BIRTH:  [birth year]   DATE OF ADMISSION:  12/10/2001  DATE OF DISCHARGE:                                 DISCHARGE SUMMARY   No dictation for this job.                                               Crist Fat Rivard, M.D.    SAR/MEDQ  D:  12/16/2001  T:  12/16/2001  Job:  [id number]

## 2010-08-20 NOTE — H&P (Signed)
NAME:  Amy Barry, Amy Barry                           ACCOUNT NO.:  [id number]   MEDICAL RECORD NO.:  [id number]                   PATIENT TYPE:  INP   LOCATION:  9163                                 [facility]   PHYSICIAN:  Osborn Coho, M.D.                DATE OF BIRTH:  Aug 23, 1970   DATE OF ADMISSION:  05/31/2002  DATE OF DISCHARGE:                                HISTORY & PHYSICAL   HISTORY OF PRESENT ILLNESS:  The patient is a 40 year old married black  female, gravida 3, para 0-1-1-1, at [redacted] weeks gestation, who is admitted for  induction of labor secondary to oligohydramnios and favorable cervix. She  denies any nausea, vomiting, headaches, or visual disturbances.  She does  report positive fetal movement. She denies any leaking or vaginal bleeding.  Her pregnancy has been [facility] at [facility] by the M.D. service  and has been complicated by a history of chronic hypertension, a history of  severe hyperemesis throughout her early pregnancy, a history of depression,  a history of kidney stones, and a history of GERD and irritable bowel  syndrome.  This particular pregnancy has also been complicated by preterm  labor for which she has received betamethasone in January of 2004.  She is  Group B Strep negative.   PAST OBSTETRICAL HISTORY:  She is a gravida 3, para 0-1-1-1. She had a  miscarriage in 1996 with no complications. In December of 2000 she delivered  a viable female infant who weighed 5 pounds 14 ounces at [redacted] weeks gestation  following an 8-hour labor. She was induced with that pregnancy secondary to  carrying the baby so low and having significant pressure as a result.  She  did have a postpartum hemorrhage, but otherwise did well.  She also reports  a history of placenta previa with her previous pregnancy.  She has been on  Ortho-Evra. She has had a history of abnormal Pap. She had ovarian cyst.   ALLERGIES:  DEMEROL, DARVOCET, CODEINE, IMITREX,  PHRENILIN.   PAST MEDICAL HISTORY:  She reports having had the usual childhood diseases.  She has a history of chronic hypertension, but has not required medications  with this pregnancy. She has a history of irritable bowel syndrome and  gastric reflux for which she has been on medications throughout this  pregnancy.   PAST SURGICAL HISTORY:  Her only surgical history involves a diagnostic  laparoscopy for ovarian cysts and irritable bowel syndrome.  She reports  that she was difficult to wake up following surgery.  She also reports that  she has a heart murmur with a hole in her heart that closed up by itself.   FAMILY HISTORY:  Significant for maternal uncle with MI, maternal uncle with  hypertension, mother with aneurysm, paternal grandmother with varicose  veins, sister with anemia, cousin with thyroid disease, paternal grandmother  with rheumatoid arthritis, sister  with epilepsy.  The patient reports a  history of seizures, but has not had any issues during this pregnancy.   PRENATAL LABORATORY DATA:  Blood type is A positive, antibody screen is  negative, sickle cell trait is negative, syphilis is nonreactive, rubella  immune, hepatitis B surface antigen negative, GC and Chlamydia both  negative, Pap is within normal limits.  Her one-hour Glucola was 105 and  maternal serum alpha fetoprotein was within normal range. Her 36-week beta  Strep was negative.  AFI today was 6.5, on February 24, her AFI was 10.6.   SOCIAL HISTORY:  She is married to [employer]. He is employed full-time at  [employer]. She is employed full-time as an Charity fundraiser. They deny any illicit drug  use, alcohol, or smoking throughout this pregnancy.   PHYSICAL EXAMINATION:  VITAL SIGNS:  Stable.  She is afebrile.  HEENT:  Grossly within normal limits.  HEART:  Regular rhythm and rate.  CHEST:  Clear.  BREASTS:  Soft and nontender.  ABDOMEN: Gravid with irregular mild uterine contractions.  Her fetal heart  rate  is reassuring.  PELVIC:  3, 90%, and vertex at -1 on February 24, per Hal Morales,  M.D.  EXTREMITIES: Within normal limits.   ASSESSMENT:  1. Intrauterine pregnancy at term.  2. Oligohydramnios.  3. Favorable cervix.  4. Negative Group B Strep.  5. History of chronic hypertension.   PLAN:  Per Dr. Su Hilt, is to admit for induction of labor with Pitocin and  the patient is in agreement.     Concha Pyo. Duplantis, C.N.M.              Osborn Coho, M.D.    SJD/MEDQ  D:  05/31/2002  T:  05/31/2002  Job:  [id number]

## 2010-08-20 NOTE — Discharge Summary (Signed)
NAME:  Amy Barry, Amy Barry                           ACCOUNT NO.:  [id number]   MEDICAL RECORD NO.:  [id number]                   PATIENT TYPE:  INP   LOCATION:  9135                                 [facility]   PHYSICIAN:  Janine Limbo, M.D.            DATE OF BIRTH:  1970-11-13   DATE OF ADMISSION:  11/05/2001  DATE OF DISCHARGE:  11/09/2001                                 DISCHARGE SUMMARY   ADMISSION DIAGNOSES:  1. Intrauterine pregnancy at approximately seven weeks gestation.  2. Hyperemesis.  3. Situational depression.  4. Sinusitis.   PROCEDURES THIS ADMISSION:  None.   HOSPITAL COURSE:  The patient is a 40 year old married black female gravida  3 para 1-0-1-1 at approximately seven weeks gestation with hyperemesis over  the last couple of weeks that had failed Phenergan, Zofran, and Reglan p.o.  She had also had two hospitalizations for IV fluids prior to her admission  on November 05, 2001.  She was very distraught and depressed on her evaluation  on November 05, 2001 and had a psych consult done during her hospitalization.  She has been offered Prozac during her hospitalization and has declined it  at this time.  She was initiated on IV fluid therapy.  She was given IV  Zofran, IV Reglan, and started on IV antibiotics also for her sinusitis, for  which she was able to keep down the p.o. antibiotic.  She also had a  gallbladder ultrasound which was negative and was started on a steroid taper  on the evening of November 06, 2001 and continued on that throughout her  hospitalization.  She continued to be fairly nauseated and had vomiting on  and off on the first two hospital days, but by August 7 she was tolerating  some p.o.'s and was tolerating some p.o. medications.  By November 09, 2001 she  was desirous of going home.  She had kept down a small amount of food  throughout the day and has kept down liquids, and reports that she, though  still nauseous, wants to go home.  She  is tolerating her p.o. medications  also.   DISCHARGE INSTRUCTIONS:  She is to call if she has resumption of vomiting at  least 5-6 times in a day.  She is to call for any bleeding or cramping or  other issues.  She is also to call for any depressive symptoms, suicidal  ideations, or homicidal ideations.   DISCHARGE MEDICATIONS:  1. Zofran 4 mg p.o. q.8h. p.r.n.  2. Reglan 10 mg p.o. q.8h.  3. Protonix 40 mg p.o. q.d.  4. Steroid taper per protocol continuing from day #3.  5. Augmentin 875 mg p.o. b.i.d. for six more days.   She continues to decline Prozac at this time.   DISCHARGE LABORATORY DATA:  Hemoglobin 11.6, WBC count 9.5, platelets 252.  Sodium 132, potassium 3.6.  Sickle cell trait was  negative.  TSH 0.69.  Hepatitis B surface antigen negative.  Blood type A positive.  RPR  nonreactive.  Rubella immune.    FOLLOW-UP:  She has an appointment with Areta Haber, a P.A. with the  psych office on Monday at 4:30 p.m. and she is to follow up at [facility] in one week or p.r.n.      Concha Pyo. Duplantis, C.N.M.              Janine Limbo, M.D.    SJD/MEDQ  D:  11/09/2001  T:  11/13/2001  Job:  [id number]

## 2010-08-20 NOTE — Discharge Summary (Signed)
NAME:  Amy Barry, Amy Barry                           ACCOUNT NO.:  [id number]   MEDICAL RECORD NO.:  [id number]                   PATIENT TYPE:  INP   LOCATION:  9120                                 FACILITY:  [facility]   PHYSICIAN:  Crist Fat. Rivard, M.D.              DATE OF BIRTH:  05/13/1970   DATE OF ADMISSION:  05/31/2002  DATE OF DISCHARGE:                                 DISCHARGE SUMMARY   ADMISSION DIAGNOSES:  1. Intrauterine pregnancy at term.  2. Oligohydramnios.  3. History of chronic hypertension.  4. Favorable cervix.  5. Negative group B strep.  6. Multiparity.  7. Desires bilateral tubal ligation for sterilization.   DISCHARGE DIAGNOSES:  1. Intrauterine pregnancy at term.  2. Oligohydramnios.  3. History of chronic hypertension.  4. Favorable cervix.  5. Negative group B strep.  6. Multiparity.  7. Desires bilateral tubal ligation for sterilization.  8. Status post normal spontaneous vaginal delivery.  9. Status post post-partum hemorrhage.  10.      Status post bilateral tubal ligation for sterilization.  11.      Breast feeding.   PROCEDURE:  1. Normal spontaneous vaginal delivery of a viable female infant named     Amy Barry who had Apgar's of 7 and 8 and weighed 5 pounds and 15 ounces and     was attended in delivery by Dr. Osborn Coho on June 01, 2002. The     patient did have postpartum hemorrhage for which she received Hemabate     times one and it resolved.  2. Bilateral tubal ligation for sterilization of June 01, 2002 also by     Dr. Osborn Coho.   HOSPITAL COURSE:  Amy Barry is a 40 year old married white female, Gravida  III, Para I, 0/1/1 at 37 and 2/7th weeks, admitted for induction of labor  secondary to oligohydramnios and favorable cervix. She was started on  Pitocin. She received an epidural and had artificial rupture of membranes.  She continued to progress nicely in labor to completely dilate at around  2:00 a.m. and delivered  around 4:47 a.m. a viable female infant named Amy Barry  who had Apgar's of 7 and 8 and weighed 5 pounds and 16 ounces. She was  attended in delivery by Dr. Osborn Coho. Please see the delivery note for  details. The patient also desired bilateral tubal ligation for sterilization  and underwent the same on the day of delivery, attended by Dr. Molly Barry.  Please see the office notes for details. Postoperatively and post-partum,  the patient has done well. She is ambulating, voiding, and eating without  difficulty. She is breast feeding without difficulty. Her vital signs were  stable and she has remained afebrile immediately post delivery. She is  deemed ready for discharge today. Additional instructions are as per the  [facility] and Gynecologic Services Handout.    DISCHARGE MEDICATIONS:  1. Motrin  600 mg by mouth every six hours as needed pain.  2. Tylox 1-2 by mouth every four to six hours as needed for pain.  3. Prenatal vitamins daily.  4. Ferrous sulfate twice a day.   LABORATORY DATA:  On discharge, hemoglobin 8.6. WBC count 12.4 and platelets  of 227,000. Discharge follow-up will be in six weeks at [facility] and Gynecologic Services or as needed.     Concha Pyo. Duplantis, C.N.M.              Crist Fat Rivard, M.D.    SJD/MEDQ  D:  06/03/2002  T:  06/03/2002  Job:  [id number]

## 2010-08-20 NOTE — H&P (Signed)
NAME:  Amy Barry, Amy Barry                           ACCOUNT NO.:  [id number]   MEDICAL RECORD NO.:  [id number]                   PATIENT TYPE:  OBV   LOCATION:  9309                                 FACILITY:  [facility]   PHYSICIAN:  Renaldo Reel. Emilee Hero, C.N.M.             DATE OF BIRTH:  [birth year]   DATE OF ADMISSION:  12/10/2001  DATE OF DISCHARGE:                                HISTORY & PHYSICAL   HISTORY OF PRESENT ILLNESS:  The patient is a 40 year old, gravida 3, para 1-  0-1-1, at 12-5/7 weeks who presented from the office for IV fluids secondary  to hyperemesis, she had lost 3 pounds in the last 3 days and had been unable  to keep food and fluids down.  She is currently on a Zofran pump but the  patient wants to stop it secondary to not helping.  She has also been on  Reglan p.o. and Protonix.  She had a methylprednisolone course in early  August without significant benefit over time.  She is currently being  followed by [facility] for Zofran pump.  They have suggested a Reglan pump  secondary to maxing out on the Zofran dosing.  The patient also has had a  psych consult on November 06, 2001 as an inpatient secondary to severe  depression.  She currently is not on any antidepressants.  She also  complains of sporadic facial tics which were occurring prior to pregnancy.   COMPLICATIONS OF PREGNANCY:  1. Hyperemesis currently and with past pregnancy, last pregnancy it     continued to 16 weeks with a failure of all prescriptions, she finally     had droperidol at 16 weeks without resolution.  2. Multiple medication allergies and sensitivities to DARVOCET, PERCOCET,     IMITREX, DEMEROL, CODEINE, and PREDNISONE.  Nausea and vomiting and     seizures occurred when she took those medications in the past.  3. History of Bells palsy in March 2000.  4. History of IBS and GERD.  5. History of seizures 1994 with unknown etiology.  6. Depression.   PRENATAL LABORATORIES:  Sickle cell screen is  negative.  TSH was within  normal limits at 0.690.  Hepatitis B surface antigen negative.  RPR was  nonreactive.  Rubella was immune.  That was all the blood that was able to  be obtained at the time.  She did have negative GC and Chlamydia cultures in  July.  She just had her new OB visit over the last week or so.   HISTORY OF PRESENT PREGNANCY:  The patient entered care at approximately [redacted]  weeks gestation with repetitive episodes of hyperemesis.  She has now had 6  MAU visits since late July.  She has been on Phenergan, Zofran, Reglan, the  Zofran pump, a methylprednisolone regimen without significant benefit.  She  also had a several-day hospitalization from November 05, 2001 for 2-3  days  secondary to initiation of the methylprednisolone course as well as a psych  consult with Dr. Jeanie Sewer secondary to severe depression and suggestion of  suicidal ideation.  The patient feels like she is not suicidal at this point  but still has significant depression secondary to severe hyperemesis.  She  has had a Zofran pump for the last 1-2 weeks which the patient feels like is  not helping at all at this point.   MEDICAL HISTORY:  1. The patient has a history of ovarian cysts.  2. Irritable bowel syndrome.  3. Gastroesophageal reflux disease.  4. History of seizures in 1994 of questionable etiology.  5. History of Bells palsy in March 2000.  6. History of depression.  7. Multiple medication allergies.   OBSTETRICAL HISTORY:  The patient had a previous vaginal delivery 1-2 years  ago with which she had significant hyperemesis during that pregnancy again  refractive to most measures.  She had droperidol given as a trial treatment  at 16 weeks, this did seem to abate the issue.   SOCIAL HISTORY:  The patient is married.  Her husband is not currently  present with her but husband supportive.  She has been employed as a Engineer, civil (consulting)  at the [employer] but has not been able to  work  through the majority of her pregnancy secondary to severe hyperemesis.  She  does have one living child.  The patient does not smoke, drink, or use  drugs.   PHYSICAL EXAMINATION:  VITAL SIGNS: Stable, the patient is afebrile.  HEENT: Within normal limits.  LUNGS: Bilateral breath sounds are clear.  HEART: Regular rate and rhythm without murmur.  BREASTS: Soft and nontender.  ABDOMEN: Fundal height is approximately 12-13 weeks.  There is slight  tenderness secondary to muscular soreness from vomiting.  Fetal heart rate  is in the 160s by Doppler.  PELVIC: Deferred.  EXTREMITIES: Within normal limits.  NEUROLOGICAL: There is no obvious facial tics at present.  BACK: There is negative CVA tenderness noted.   LABORATORY DATA:  Clean catch urine today showed specific gravity of 1.020,  15 of ketones, urobilinogen greater than 8 and small leukocyte esterase.  She had a normal TSH in August.   IMPRESSION:  1. Intrauterine pregnancy at 12-5/7 weeks.  2. Hyperemesis refractory to usual measures.  3. Depression.   PLAN:  1. Admit to the [facility] for 23-hour observation per     consult with Dr. Silverio Lay as attending physician.  2. Continue IV fluid hydration after the second bag of multivitamin with D-5     LR with remaining bags LR at 200 cc/hr.  3. Reglan 5 mg IV q.8h.  4. Continue Zofran pump at present to allow for dual therapy with Reglan and     Zofran.  5. Barbee Shropshire will be consulted for a visit in the morning to determine     appropriateness of a Reglan pump.  This has already been done and they     will see the patient on December 11, 2001.  6. Per Dr. Estanislado Pandy, may need to consider another methylprednisolone regimen.                                               Renaldo Reel Emilee Hero, C.N.M.    VLL/MEDQ  D:  12/10/2001  T:  12/11/2001  Job:  [id number]

## 2010-10-04 ENCOUNTER — Other Ambulatory Visit: Payer: Self-pay | Admitting: Family Medicine

## 2010-10-31 ENCOUNTER — Other Ambulatory Visit: Payer: Self-pay | Admitting: Family Medicine

## 2010-11-18 ENCOUNTER — Ambulatory Visit (INDEPENDENT_AMBULATORY_CARE_PROVIDER_SITE_OTHER): Payer: BLUE CROSS/BLUE SHIELD | Admitting: Family Medicine

## 2010-11-18 ENCOUNTER — Encounter: Payer: Self-pay | Admitting: Family Medicine

## 2010-11-18 VITALS — BP 102/78 | HR 64 | Temp 98.0°F | Wt 156.0 lb

## 2010-11-18 DIAGNOSIS — G4730 Sleep apnea, unspecified: Secondary | ICD-10-CM

## 2010-11-18 DIAGNOSIS — R5381 Other malaise: Secondary | ICD-10-CM

## 2010-11-18 DIAGNOSIS — R5383 Other fatigue: Secondary | ICD-10-CM

## 2010-11-18 DIAGNOSIS — I10 Essential (primary) hypertension: Secondary | ICD-10-CM

## 2010-11-18 LAB — CBC WITH DIFFERENTIAL/PLATELET
Basophils Absolute: 0 10*3/uL (ref 0.0–0.1)
HCT: 38.2 % (ref 36.0–46.0)
Hemoglobin: 12.7 g/dL (ref 12.0–15.0)
Lymphocytes Relative: 25 % (ref 12.0–46.0)
Monocytes Absolute: 0.5 10*3/uL (ref 0.1–1.0)
Platelets: 210 10*3/uL (ref 150.0–400.0)

## 2010-11-18 LAB — BASIC METABOLIC PANEL
BUN: 13 mg/dL (ref 6–23)
Chloride: 101 mEq/L (ref 96–112)
Creatinine, Ser: 0.7 mg/dL (ref 0.4–1.2)
GFR: 115.4 mL/min (ref >60.00)
Glucose, Bld: 99 mg/dL (ref 70–99)

## 2010-11-18 MED ORDER — FUROSEMIDE 40 MG PO TABS: 20.0000 mg | ORAL_TABLET | Freq: Every day | ORAL | Status: DC

## 2010-11-18 NOTE — Progress Notes (Signed)
  Subjective:    Patient ID: Amy Barry, female    DOB: [birth year], 40 y.o.   MRN: [id number]  HPI Here to complain of constant fatigue and sleepiness. She thinks she sleeps well at night, but her husband has told her for years that she snores a lot and is very restless all night. She has trouble focusing at work, and she fights sleepiness all day.    Review of Systems  Constitutional: Positive for fatigue.  Respiratory: Negative.   Cardiovascular: Negative.        Objective:   Physical Exam  Constitutional: She appears well-developed and well-nourished.  HENT:       She has a very narrow posterior OP   Neck: No thyromegaly present.  Cardiovascular: Normal rate, regular rhythm, normal heart sounds and intact distal pulses.   Pulmonary/Chest: Effort normal and breath sounds normal.  Musculoskeletal: She exhibits no edema.          Assessment & Plan:  I suspect she has obstructive sleep apnea. We will decrease her Lasix to 20 mg a day. Get labs today. Refer for a sleep study

## 2010-11-23 ENCOUNTER — Telehealth: Payer: Self-pay

## 2010-11-23 NOTE — Telephone Encounter (Signed)
Labs mailed

## 2010-11-23 NOTE — Telephone Encounter (Signed)
Message copied by Beverely Low on Tue Nov 23, 2010  1:19 PM ------      Message from: Gershon Crane A      Created: Tue Nov 23, 2010  5:31 AM       normal

## 2010-12-03 ENCOUNTER — Institutional Professional Consult (permissible substitution): Payer: BLUE CROSS/BLUE SHIELD | Admitting: Pulmonary Disease

## 2010-12-07 ENCOUNTER — Encounter: Payer: Self-pay | Admitting: Pulmonary Disease

## 2010-12-08 ENCOUNTER — Ambulatory Visit (INDEPENDENT_AMBULATORY_CARE_PROVIDER_SITE_OTHER): Payer: BLUE CROSS/BLUE SHIELD | Admitting: Pulmonary Disease

## 2010-12-08 ENCOUNTER — Encounter: Payer: Self-pay | Admitting: Pulmonary Disease

## 2010-12-08 VITALS — BP 140/82 | HR 71 | Temp 98.2°F | Ht 61.5 in | Wt 157.2 lb

## 2010-12-08 DIAGNOSIS — G4710 Hypersomnia, unspecified: Secondary | ICD-10-CM

## 2010-12-08 NOTE — Assessment & Plan Note (Signed)
The patient notes significant daytime hypersomnia for at least the last 10 years, but has been worsening over the last few years.  She is clearly having sleep disruption at night for unknown reasons, but does have a history that is somewhat suggestive of sleep-disordered breathing.  She also has a history suggestive of RLS, and has insomnia issues as well.  She admits that her husband snores loudly, but doesn't feel it is a cause of sleep disruption.  At this point, I think she will need a sleep study to evaluate for various sleep disorders.  If nothing is found, may need to consider a neurologic evaluation as well.

## 2010-12-08 NOTE — Progress Notes (Signed)
Subjective:    Patient ID: Amy Barry, female    DOB: September 21, 1970, 40 y.o.   MRN: [id number]  HPI The patient is a 40 year old female who I've been asked to see for daytime hypersomnia.  This has been going on for about 10 years, but has been a lot worse over the last 2 years.  She's been noted to have snoring, and also admits to choking arousals at times.  Her husband has not really seen an abnormal breathing pattern during sleep.  The patient states that she has frequent awakenings at night, and sometimes will take her 1-2 hours to fall back asleep.  She does not feel rested upon arising in the mornings, and notes significant daytime sleepiness while at work.  She can easily fall asleep while working at a computer in the late morning and early afternoon.  She also falls asleep very easily while watching TV or movies in the evening.  She also has sleep pressure while driving short or long distances.  The patient states that her weight is up 20 pounds over the last 2 years, and her Epworth score today is abnormal at 20.  The patient denies any obvious kicking during the night, but does have to move her legs while trying to go to sleep at night.  She feels they are uncomfortable.  The patient denies any environmental issues in the bedroom, but does admit that her husband is a loud snorer.  She denies any known history of head trauma, but does have a history of palpitations which have disrupted sleep in the past.  This has been much improved since being on medication.  Sleep Questionnaire: What time do you typically go to bed?( Between what hours) 10pm How long does it take you to fall asleep? usually quickly How many times during the night do you wake up? 3 What time do you get out of bed to start your day? 0600 Do you drive or operate heavy machinery in your occupation? No How much has your weight changed (up or down) over the past two years? (In pounds) 20 lb (9.072 kg) Have you ever had a sleep study  before? No Do you currently use CPAP? No Do you wear oxygen at any time? No     Review of Systems  Constitutional: Negative for fever and unexpected weight change.  HENT: Positive for ear pain, congestion, sore throat and sinus pressure. Negative for nosebleeds, rhinorrhea, sneezing, trouble swallowing, dental problem and postnasal drip.   Eyes: Negative for redness and itching.  Respiratory: Positive for cough and shortness of breath. Negative for chest tightness and wheezing.   Cardiovascular: Positive for palpitations. Negative for leg swelling.  Gastrointestinal: Positive for nausea and vomiting.  Genitourinary: Negative for dysuria.  Musculoskeletal: Positive for joint swelling.  Skin: Negative for rash.  Neurological: Positive for headaches.  Hematological: Does not bruise/bleed easily.  Psychiatric/Behavioral: Negative for dysphoric mood. The patient is not nervous/anxious.        Objective:   Physical Exam Constitutional:  Well developed, no acute distress  HENT:  Nares patent without discharge, but large turbinates  Oropharynx without exudate, palate and uvula are moderately elongated.   Eyes:  Perrla, eomi, no scleral icterus  Neck:  No JVD, no TMG  Cardiovascular:  Normal rate, regular rhythm, no rubs or gallops.  No murmurs        Intact distal pulses  Pulmonary :  Normal breath sounds, no stridor or respiratory distress   No rales,  rhonchi, or wheezing  Abdominal:  Soft, nondistended, bowel sounds present.  No tenderness noted.   Musculoskeletal:  No lower extremity edema noted.  Lymph Nodes:  No cervical lymphadenopathy noted  Skin:  No cyanosis noted  Neurologic:  Alert, appropriate, moves all 4 extremities without obvious deficit.         Assessment & Plan:

## 2010-12-08 NOTE — Patient Instructions (Signed)
Will schedule for a sleep study, and arrange followup once results are available.   

## 2010-12-30 ENCOUNTER — Encounter (HOSPITAL_BASED_OUTPATIENT_CLINIC_OR_DEPARTMENT_OTHER): Payer: BLUE CROSS/BLUE SHIELD

## 2011-01-03 LAB — DIFFERENTIAL
Eosinophils Absolute: 0
Eosinophils Relative: 1
Neutrophils Relative %: 77

## 2011-01-03 LAB — POCT I-STAT, CHEM 8
BUN: 8
Calcium, Ion: 1.15
Chloride: 103
Glucose, Bld: 100 — ABNORMAL HIGH
Hemoglobin: 12.9
Potassium: 3.2 — ABNORMAL LOW
Sodium: 140
TCO2: 24

## 2011-01-03 LAB — POCT CARDIAC MARKERS
Myoglobin, poc: 40.5
Myoglobin, poc: 58.6
Troponin i, poc: <0.05

## 2011-01-03 LAB — CBC
HCT: 38
Hemoglobin: 13
MCHC: 34.2
MCV: 94.9
WBC: 8.6

## 2011-01-07 LAB — CBC
HCT: 37.8
Hemoglobin: 13.1
MCHC: 34.5
MCV: 93.5
Platelets: 220
RBC: 4.05
RDW: 12.7
WBC: 7.7

## 2011-01-07 LAB — TSH: TSH: 1.028

## 2011-01-07 LAB — BASIC METABOLIC PANEL
BUN: 8
CO2: 24
Creatinine, Ser: 0.6
GFR calc non Af Amer: >60
Glucose, Bld: 82
Potassium: 4.6

## 2011-01-07 LAB — POCT CARDIAC MARKERS
CKMB, poc: <1 — ABNORMAL LOW
Myoglobin, poc: 36.3
Operator id: 198171
Troponin i, poc: <0.05

## 2011-01-11 ENCOUNTER — Other Ambulatory Visit: Payer: Self-pay | Admitting: Family Medicine

## 2011-01-13 ENCOUNTER — Encounter (HOSPITAL_BASED_OUTPATIENT_CLINIC_OR_DEPARTMENT_OTHER): Payer: BLUE CROSS/BLUE SHIELD

## 2011-03-19 ENCOUNTER — Other Ambulatory Visit: Payer: Self-pay | Admitting: Family Medicine

## 2011-04-15 ENCOUNTER — Telehealth: Payer: Self-pay | Admitting: Pulmonary Disease

## 2011-04-15 DIAGNOSIS — G4710 Hypersomnia, unspecified: Secondary | ICD-10-CM

## 2011-04-15 NOTE — Telephone Encounter (Signed)
I spoke with pt and she states she never got to have ehr sleep study done bc her child was sick. When she tried to reschedule she was told they needed a new order. Is it okay to send new order or do you want to see pt again since she was last seen 12/2009. Please advise Dr. Shelle Iron, thanks

## 2011-04-15 NOTE — Telephone Encounter (Signed)
I saw her 2012!  Ok to send order for sleep study.   [place] is the code.

## 2011-04-18 NOTE — Telephone Encounter (Signed)
Order has been placed and pt is aware. Nothing further was needed 

## 2011-05-02 ENCOUNTER — Ambulatory Visit (HOSPITAL_BASED_OUTPATIENT_CLINIC_OR_DEPARTMENT_OTHER): Payer: BC Managed Care – PPO | Attending: Pulmonary Disease | Admitting: General Practice

## 2011-05-02 VITALS — Ht 61.0 in | Wt 143.0 lb

## 2011-05-02 DIAGNOSIS — G4733 Obstructive sleep apnea (adult) (pediatric): Secondary | ICD-10-CM

## 2011-05-02 DIAGNOSIS — G4710 Hypersomnia, unspecified: Secondary | ICD-10-CM | POA: Insufficient documentation

## 2011-05-10 DIAGNOSIS — G4730 Sleep apnea, unspecified: Secondary | ICD-10-CM

## 2011-05-10 DIAGNOSIS — M62838 Other muscle spasm: Secondary | ICD-10-CM

## 2011-05-10 DIAGNOSIS — G4710 Hypersomnia, unspecified: Secondary | ICD-10-CM

## 2011-05-11 ENCOUNTER — Telehealth: Payer: Self-pay | Admitting: *Deleted

## 2011-05-11 NOTE — Telephone Encounter (Signed)
Per KC, pt needs ov to discuss sleep study results. Called and spoke with pt.  Pt scheduled to see [facility] tomorrow 05/12/11 at 11:00 am

## 2011-05-11 NOTE — Procedures (Signed)
Amy Barry, Amy Barry                 ACCOUNT NO.:  [id number]  MEDICAL RECORD NO.:  [id number]          PATIENT TYPE:  OUT  LOCATION:  SLEEP CENTER                 FACILITY:  [facility]  PHYSICIAN:  Barbaraann Share, MD,FCCPDATE OF BIRTH:  [birth year]  DATE OF STUDY:  05/02/2011                           NOCTURNAL POLYSOMNOGRAM  REFERRING PHYSICIAN:  Barbaraann Share, MD,FCCP  INDICATION FOR STUDY:  Hypersomnia with sleep apnea.  EPWORTH SCORE:  18.  SLEEP ARCHITECTURE:  The patient had a total sleep time of 258 minutes with minimal slow-wave sleep and only 77 minutes of REM.  REM onset latency was very rapid at 2 minutes, and sleep onset was normal at 2.5 minutes.  Sleep efficiency was moderately reduced at 71%.  RESPIRATORY DATA:  The patient was found to have 2 central apneas and only 2 obstructive hypopneas, giving her an apnea/hypopnea index of 0.9 events per hour.  The events occurred primarily in the supine position. There was moderate snoring noted throughout.  OXYGEN DATA:  There was O2 desaturation as low as 95% with the patient's obstructive events.  CARDIAC DATA:  No clinically significant arrhythmias were noted.  MOVEMENT/PARASOMNIA:  The patient had small numbers of leg jerks with no significant sleep disruption.  IMPRESSION/RECOMMENDATION: 1. Small numbers of obstructive events which do not meet the AHI     criteria for the obstructive sleep apnea syndrome. 2. The patient had no obvious arrhythmia, abnormal behaviors, or     movement disorder of sleep which contributes to sleep     disruption/arousals. 3. The patient was noted to have very rapid onset of REM at the     initiation of the sleep study, and when combined     with her history of hypersomnia, this raises the question of     possible narcolepsy.  Clinical correlation is suggested.     Barbaraann Share, MD,FCCP Diplomate, American Board of Sleep Medicine Electronically Signed    KMC/MEDQ  D:   05/10/2011 19:14:23  T:  05/11/2011 07:12:00  Job:  [id number]

## 2011-05-12 ENCOUNTER — Ambulatory Visit (INDEPENDENT_AMBULATORY_CARE_PROVIDER_SITE_OTHER): Payer: BC Managed Care – PPO | Admitting: Pulmonary Disease

## 2011-05-12 ENCOUNTER — Encounter: Payer: Self-pay | Admitting: Pulmonary Disease

## 2011-05-12 VITALS — BP 128/74 | HR 79 | Temp 98.0°F | Ht 61.5 in | Wt 155.8 lb

## 2011-05-12 DIAGNOSIS — G4710 Hypersomnia, unspecified: Secondary | ICD-10-CM

## 2011-05-12 NOTE — Progress Notes (Signed)
  Subjective:    Patient ID: Amy Barry, female    DOB: December 18, 1970, 41 y.o.   MRN: [id number]  HPI The patient comes in today for follow up after her recent sleep study for daytime hypersomnia.  She was found to have small numbers of apneas and hypopneas, but none that were clinically significant.  She had no findings to explain her daytime hypersomnia.  She did have very rapid REM onset.  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   Review of Systems  Constitutional: Negative for fever and unexpected weight change.  HENT: Negative for ear pain, nosebleeds, congestion, sore throat, rhinorrhea, sneezing, trouble swallowing, dental problem, postnasal drip and sinus pressure.   Eyes: Negative for redness and itching.  Respiratory: Positive for cough and chest tightness. Negative for shortness of breath and wheezing.   Cardiovascular: Negative for palpitations and leg swelling.  Gastrointestinal: Negative for nausea and vomiting.  Genitourinary: Negative for dysuria.  Musculoskeletal: Negative for joint swelling.  Skin: Negative for rash.  Neurological: Negative for headaches.  Hematological: Does not bruise/bleed easily.  Psychiatric/Behavioral: Negative for dysphoric mood. The patient is not nervous/anxious.        Objective:   Physical Exam Well developed female in no acute distress Nose without purulence or discharge noted Chest totally clear Cardiac exam with regular rate and rhythm Lower extremities without edema, no cyanosis Awake, but does appear mildly sleepy, moves all 4 extremities.       Assessment & Plan:

## 2011-05-12 NOTE — Assessment & Plan Note (Signed)
The patient did not have any significant sleep disordered breathing on her sleep study, or any obvious explanation for daytime sleepiness.  She did have rapid REM onset, and it is unclear whether this could be related to chronic sleep depravation or possibly narcolepsy.  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  I would like to have her fill out sleep log for the next 2 weeks, and then schedule for a MSLT.

## 2011-05-12 NOTE — Patient Instructions (Addendum)
Please fill out sleep logs for next 2 weeks, and BRING to your sleep study upcoming. Will schedule for MSLT, a daytime sleep study.  Make sure you get a good nights sleep for the days leading up to your daytime study. Do not take any antidepressants or ativan leading up to your study Will arrange followup once study is done.

## 2011-05-25 ENCOUNTER — Encounter (HOSPITAL_BASED_OUTPATIENT_CLINIC_OR_DEPARTMENT_OTHER): Payer: BC Managed Care – PPO

## 2011-05-31 ENCOUNTER — Ambulatory Visit (HOSPITAL_BASED_OUTPATIENT_CLINIC_OR_DEPARTMENT_OTHER): Payer: BC Managed Care – PPO | Attending: Pulmonary Disease | Admitting: Radiology

## 2011-05-31 DIAGNOSIS — G4710 Hypersomnia, unspecified: Secondary | ICD-10-CM

## 2011-06-21 DIAGNOSIS — G4710 Hypersomnia, unspecified: Secondary | ICD-10-CM

## 2011-06-22 ENCOUNTER — Telehealth: Payer: Self-pay | Admitting: *Deleted

## 2011-06-22 NOTE — Procedures (Signed)
Amy Barry, Amy Barry                 ACCOUNT NO.:  [id number]  MEDICAL RECORD NO.:  [id number]          PATIENT TYPE:  OUT  LOCATION:  SLEEP CENTER                 [facility]ACILITY[facility]  [facility]  PHYSICIAN:  Barbaraann Share, MD,FCCPDATE OF BIRTH:  [birth year] 10, 1971  DATE OF STUDY:  06/21/2011                         MULTIPLE SLEEP LATENCY TEST  REFERRING PHYSICIAN:  Barbaraann Share, MD,FCCP  REFERRING PHYSICIAN:  Barbaraann Share, MD,FCCP  INDICATION FOR STUDY:  Hypersomnia with unremarkable nocturnal polysomnogram.  EPWORTH SLEEPINESS SCORE:  12.    NAP 1:  Sleep onset latency of 9 minutes.  REM latency N/A.  NAP 2:  Sleep onset latency of 9 minutes.  REM latency N/A.  NAP 3:  Sleep onset latency of 20 minutes.  REM latency N/A.  NAP 4:  Sleep onset latency 10.5 minutes.  REM latency N/A.  NAP 5:  Sleep onset latency 18 minutes.  REM latency N/A.   MEAN SLEEP LATENCY:  13.3 minutes.  NUMBER OF REM EPISODES:0  COMMENTS: 1. The patient underwent nocturnal polysomnography recently, but did     not show any evidence for sleep disordered breathing or other cause     for daytime hypersomnia. 2. The patient did provide sleep logs for the 2 weeks leading up to     her MSLT and appeared to have adequate quantity of sleep. 3. It should be noted the patient's sleep in NAP #3 was disrupted due     to noise in the hallway.  IMPRESSIONS-RECOMMENDATIONS:  No evidence for objective abnormal daytime sleepiness by MSLT, with a mean sleep latency of 13.3 minutes and no REM period noted.  Clinical correlation is suggested.     Barbaraann Share, MD,FCCP Diplomate, American Board of Sleep Medicine    KMC/MEDQ  D:  06/21/2011 10:45:41  T:  06/21/2011 22:08:16  Job:  [id number]

## 2011-06-22 NOTE — Telephone Encounter (Signed)
Per KC, pt needs ov with him to discuss MSLT results.  LMOM for pt TCB

## 2011-06-24 NOTE — Telephone Encounter (Signed)
Pt called back. Pt scheduled to see [facility] 07/06/11 at 9:45 am.  Pt requested this day as she recently started a new job and did not want appt to conflict with work.

## 2011-06-24 NOTE — Telephone Encounter (Signed)
LMOM for pt TCB 

## 2011-07-06 ENCOUNTER — Ambulatory Visit (INDEPENDENT_AMBULATORY_CARE_PROVIDER_SITE_OTHER): Payer: BC Managed Care – PPO | Admitting: Pulmonary Disease

## 2011-07-06 ENCOUNTER — Encounter: Payer: Self-pay | Admitting: Pulmonary Disease

## 2011-07-06 VITALS — BP 122/80 | HR 72 | Temp 98.5°F | Ht 61.0 in | Wt 151.8 lb

## 2011-07-06 DIAGNOSIS — G4710 Hypersomnia, unspecified: Secondary | ICD-10-CM

## 2011-07-06 NOTE — Progress Notes (Signed)
  Subjective:    Patient ID: Amy Barry, female    DOB: January 26, 1971, 41 y.o.   MRN: [id number]  HPI The patient comes in today for followup after her recent multiple sleep latency test.  She was found to have a mean sleep latency of 13.3 minutes, and no REM periods noted.  I have reviewed the study with her in detail, and answered all of her questions.  I did notice from her sleep diaries that she is going to bed very early, having awakenings during the night, and is starting her day very early as well.   Review of Systems  Constitutional: Negative for fever and unexpected weight change.  HENT: Positive for congestion, rhinorrhea, sneezing and postnasal drip. Negative for ear pain, nosebleeds, sore throat, trouble swallowing, dental problem and sinus pressure.   Eyes: Negative for redness and itching.  Respiratory: Positive for cough. Negative for chest tightness, shortness of breath and wheezing.   Cardiovascular: Positive for palpitations and leg swelling.  Gastrointestinal: Negative for nausea and vomiting.  Genitourinary: Negative for dysuria.  Musculoskeletal: Negative for joint swelling.  Skin: Negative for rash.  Neurological: Negative for headaches.  Hematological: Does not bruise/bleed easily.  Psychiatric/Behavioral: Negative for dysphoric mood. The patient is not nervous/anxious.        Objective:   Physical Exam Well-developed female in no acute distress Nose without purulence or discharge noted Lower extremities without edema, no cyanosis Alert and oriented, moves all 4 extremities.       Assessment & Plan:

## 2011-07-06 NOTE — Assessment & Plan Note (Signed)
The patient has had an MS LT that was not consistent with abnormal objective daytime sleepiness or narcolepsy.  Her sleep diaries did showed that she was going to bed extremely early, having awakenings during the night, and awakening very early in the morning the following day.  I would like her to try and establish a more normalized sleep schedule, but if this does not help her daytime sleepiness, would consider whether there are medical issues that are contributing to her symptoms.

## 2011-07-06 NOTE — Patient Instructions (Signed)
Try to not go to bed until 10pm at the earliest for the next 3 weeks, and try to sleep until 6am.  No napping during the day. If you continue to have your daytime sleepiness, would recommend that your primary check for medical causes of this (i.e. Hormonal issue, occult autoimmune disease, inflammatory conditions, etc.) Please give me some feedback in 3 weeks with how things are going.

## 2011-10-24 ENCOUNTER — Ambulatory Visit (INDEPENDENT_AMBULATORY_CARE_PROVIDER_SITE_OTHER): Payer: BC Managed Care – PPO | Admitting: Family Medicine

## 2011-10-24 ENCOUNTER — Encounter: Payer: Self-pay | Admitting: Family Medicine

## 2011-10-24 ENCOUNTER — Ambulatory Visit: Payer: BC Managed Care – PPO | Admitting: Family Medicine

## 2011-10-24 VITALS — BP 112/84 | HR 84 | Temp 98.8°F | Wt 145.0 lb

## 2011-10-24 DIAGNOSIS — M542 Cervicalgia: Secondary | ICD-10-CM

## 2011-10-24 DIAGNOSIS — R5381 Other malaise: Secondary | ICD-10-CM

## 2011-10-24 DIAGNOSIS — R531 Weakness: Secondary | ICD-10-CM

## 2011-10-24 LAB — BASIC METABOLIC PANEL
Calcium: 9.4 mg/dL (ref 8.4–10.5)
GFR: 71.18 mL/min (ref >60.00)
Glucose, Bld: 93 mg/dL (ref 70–99)
Potassium: 4.2 mEq/L (ref 3.5–5.1)
Sodium: 138 mEq/L (ref 135–145)

## 2011-10-24 LAB — CBC WITH DIFFERENTIAL/PLATELET
Basophils Relative: 0.5 % (ref 0.0–3.0)
Eosinophils Absolute: 0.1 10*3/uL (ref 0.0–0.7)
HCT: 37.3 % (ref 36.0–46.0)
Hemoglobin: 12.2 g/dL (ref 12.0–15.0)
Lymphocytes Relative: 31.4 % (ref 12.0–46.0)
Lymphs Abs: 1.8 10*3/uL (ref 0.7–4.0)
MCHC: 32.7 g/dL (ref 30.0–36.0)
Monocytes Relative: 9.4 % (ref 3.0–12.0)
Neutro Abs: 3.3 10*3/uL (ref 1.4–7.7)
RBC: 3.9 Mil/uL (ref 3.87–5.11)
RDW: 13.1 % (ref 11.5–14.6)

## 2011-10-24 LAB — HEPATIC FUNCTION PANEL
AST: 17 U/L (ref 0–37)
Albumin: 4.2 g/dL (ref 3.5–5.2)
Alkaline Phosphatase: 43 U/L (ref 39–117)
Total Bilirubin: 0.6 mg/dL (ref 0.3–1.2)

## 2011-10-24 NOTE — Progress Notes (Signed)
  Subjective:    Patient ID: Amy Barry, female    DOB: [birth year], 41 y.o.   MRN: [id number]  HPI Here for 2 reasons. First she has had worsening pains in the right side of her neck for 3 years , but these have gotten worse the past month. She gets sharp pains that radiate through the right shoulder and down the right arm. The arm gets weak, it tingles, and it feels numb. She has started to drop objects occasionally. She is right handed. Also she has experienced generalized muscle weakness for a month or so.    Review of Systems  HENT: Positive for neck pain and neck stiffness.   Respiratory: Negative.   Cardiovascular: Negative.   Musculoskeletal: Positive for myalgias and arthralgias. Negative for back pain.  Neurological: Positive for weakness and numbness. Negative for dizziness, tremors, seizures, syncope, facial asymmetry, speech difficulty, light-headedness and headaches.       Objective:   Physical Exam  Constitutional: She is oriented to person, place, and time. She appears well-developed and well-nourished.  Cardiovascular: Normal rate, regular rhythm, normal heart sounds and intact distal pulses.   Pulmonary/Chest: Effort normal and breath sounds normal.  Musculoskeletal:       Tender over the right side of the neck and into the right trapezius. Full ROM of the neck and right shoulder.   Neurological: She is alert and oriented to person, place, and time. She has normal reflexes. No cranial nerve deficit. She exhibits normal muscle tone. Coordination normal.          Assessment & Plan:  She has some pinched nerves in the neck, so we will set up an MRI of the cervical spine to evaluate. Use heat and Motrin prn. As for the muscle weakness we will get labs today

## 2011-10-25 NOTE — Progress Notes (Signed)
Quick Note:  I left voice message with normal results. ______ 

## 2011-10-27 ENCOUNTER — Ambulatory Visit
Admission: RE | Admit: 2011-10-27 | Discharge: 2011-10-27 | Disposition: A | Discharge status: Home / Self Care | Payer: BC Managed Care – PPO | Source: Ambulatory Visit | Attending: Family Medicine | Admitting: Family Medicine

## 2011-10-27 DIAGNOSIS — M542 Cervicalgia: Secondary | ICD-10-CM

## 2011-10-27 MED ORDER — GADOBENATE DIMEGLUMINE 529 MG/ML IV SOLN
15.0000 mL | Freq: Once | INTRAVENOUS | Status: AC | PRN
  Administered 2011-10-27: 15 mL via INTRAVENOUS

## 2011-11-02 ENCOUNTER — Telehealth: Payer: Self-pay | Admitting: Family Medicine

## 2011-11-02 DIAGNOSIS — M542 Cervicalgia: Secondary | ICD-10-CM

## 2011-11-02 NOTE — Telephone Encounter (Signed)
Caller: Talayla/Patient; PCP: Nelwyn Salisbury.; CB#: [phone]; ; ; Call regarding Requesting Results of MRI;   Patient calling requesting results of MRI of neck completed 10/27/11. RN reviewed Epic, EHR. Results are available in Epic.  PLEASE RETURN CALL TO PATIENT AT [phone] WITH MRI RESULTS.

## 2011-11-03 NOTE — Progress Notes (Signed)
Quick Note:  I spoke with pt ______ 

## 2011-11-03 NOTE — Telephone Encounter (Signed)
I spoke with pt  

## 2011-11-03 NOTE — Telephone Encounter (Signed)
See the MRI report. I am sending her to PT

## 2011-11-08 ENCOUNTER — Ambulatory Visit: Payer: BC Managed Care – PPO | Attending: Family Medicine

## 2011-11-08 DIAGNOSIS — IMO0001 Reserved for inherently not codable concepts without codable children: Secondary | ICD-10-CM | POA: Insufficient documentation

## 2011-11-08 DIAGNOSIS — M25519 Pain in unspecified shoulder: Secondary | ICD-10-CM | POA: Insufficient documentation

## 2011-11-08 DIAGNOSIS — M542 Cervicalgia: Secondary | ICD-10-CM | POA: Insufficient documentation

## 2011-11-08 DIAGNOSIS — R5381 Other malaise: Secondary | ICD-10-CM | POA: Insufficient documentation

## 2011-11-08 DIAGNOSIS — R293 Abnormal posture: Secondary | ICD-10-CM | POA: Insufficient documentation

## 2011-11-10 ENCOUNTER — Ambulatory Visit: Payer: BC Managed Care – PPO | Admitting: Physical Therapy

## 2011-11-15 ENCOUNTER — Ambulatory Visit: Payer: BC Managed Care – PPO

## 2011-11-17 ENCOUNTER — Ambulatory Visit: Payer: BC Managed Care – PPO | Admitting: Physical Therapy

## 2011-11-22 ENCOUNTER — Ambulatory Visit: Payer: BC Managed Care – PPO | Admitting: Physical Therapy

## 2011-11-24 ENCOUNTER — Encounter: Payer: BC Managed Care – PPO | Admitting: Physical Therapy

## 2011-11-29 ENCOUNTER — Ambulatory Visit: Payer: BC Managed Care – PPO | Admitting: Physical Therapy

## 2011-11-29 ENCOUNTER — Ambulatory Visit: Payer: BC Managed Care – PPO

## 2011-12-01 ENCOUNTER — Ambulatory Visit: Payer: BC Managed Care – PPO

## 2011-12-06 ENCOUNTER — Ambulatory Visit: Payer: BC Managed Care – PPO | Attending: Family Medicine

## 2011-12-06 DIAGNOSIS — R5381 Other malaise: Secondary | ICD-10-CM | POA: Insufficient documentation

## 2011-12-06 DIAGNOSIS — R293 Abnormal posture: Secondary | ICD-10-CM | POA: Insufficient documentation

## 2011-12-06 DIAGNOSIS — M542 Cervicalgia: Secondary | ICD-10-CM | POA: Insufficient documentation

## 2011-12-06 DIAGNOSIS — M25519 Pain in unspecified shoulder: Secondary | ICD-10-CM | POA: Insufficient documentation

## 2011-12-06 DIAGNOSIS — IMO0001 Reserved for inherently not codable concepts without codable children: Secondary | ICD-10-CM | POA: Insufficient documentation

## 2011-12-08 ENCOUNTER — Ambulatory Visit: Payer: BC Managed Care – PPO

## 2012-05-14 ENCOUNTER — Other Ambulatory Visit (INDEPENDENT_AMBULATORY_CARE_PROVIDER_SITE_OTHER): Payer: BC Managed Care – PPO

## 2012-05-14 DIAGNOSIS — Z0000 Encounter for general adult medical examination without abnormal findings: Secondary | ICD-10-CM

## 2012-05-14 LAB — BASIC METABOLIC PANEL
BUN: 13 mg/dL (ref 6–23)
Chloride: 105 mEq/L (ref 96–112)
Creatinine, Ser: 0.6 mg/dL (ref 0.4–1.2)
Glucose, Bld: 83 mg/dL (ref 70–99)
Potassium: 3.8 mEq/L (ref 3.5–5.1)

## 2012-05-14 LAB — HEPATIC FUNCTION PANEL
ALT: 15 U/L (ref 0–35)
Total Bilirubin: 0.5 mg/dL (ref 0.3–1.2)

## 2012-05-14 LAB — CBC WITH DIFFERENTIAL/PLATELET
Basophils Absolute: 0 10*3/uL (ref 0.0–0.1)
Eosinophils Absolute: 0 10*3/uL (ref 0.0–0.7)
HCT: 36.2 % (ref 36.0–46.0)
Lymphs Abs: 1.4 10*3/uL (ref 0.7–4.0)
MCHC: 33.5 g/dL (ref 30.0–36.0)
MCV: 93 fl (ref 78.0–100.0)
Monocytes Absolute: 0.6 10*3/uL (ref 0.1–1.0)
Neutrophils Relative %: 64 % (ref 43.0–77.0)
Platelets: 229 10*3/uL (ref 150.0–400.0)
RDW: 13.2 % (ref 11.5–14.6)

## 2012-05-14 LAB — POCT URINALYSIS DIPSTICK
Bilirubin, UA: NEGATIVE
Glucose, UA: NEGATIVE
Leukocytes, UA: NEGATIVE
Nitrite, UA: NEGATIVE
pH, UA: 6.5

## 2012-05-14 LAB — LIPID PANEL
Cholesterol: 167 mg/dL (ref 0–200)
HDL: 60.1 mg/dL (ref >39.00)

## 2012-05-14 LAB — TSH: TSH: 2.04 u[IU]/mL (ref 0.35–5.50)

## 2012-05-21 ENCOUNTER — Ambulatory Visit (INDEPENDENT_AMBULATORY_CARE_PROVIDER_SITE_OTHER): Payer: BC Managed Care – PPO | Admitting: Family Medicine

## 2012-05-21 ENCOUNTER — Encounter: Payer: Self-pay | Admitting: Family Medicine

## 2012-05-21 VITALS — BP 134/82 | HR 89 | Temp 98.7°F | Ht 62.5 in | Wt 144.0 lb

## 2012-05-21 DIAGNOSIS — Z0000 Encounter for general adult medical examination without abnormal findings: Secondary | ICD-10-CM

## 2012-05-21 MED ORDER — METOPROLOL TARTRATE 100 MG PO TABS: 100.0000 mg | ORAL_TABLET | Freq: Two times a day (BID) | ORAL | Status: DC

## 2012-05-21 MED ORDER — OMEPRAZOLE 40 MG PO CPDR: 40.0000 mg | DELAYED_RELEASE_CAPSULE | Freq: Every day | ORAL | Status: DC

## 2012-05-21 MED ORDER — POTASSIUM CHLORIDE ER 10 MEQ PO TBCR: 10.0000 meq | EXTENDED_RELEASE_TABLET | Freq: Two times a day (BID) | ORAL | Status: DC

## 2012-05-21 MED ORDER — FUROSEMIDE 40 MG PO TABS: 20.0000 mg | ORAL_TABLET | Freq: Every day | ORAL | Status: DC

## 2012-05-21 NOTE — Progress Notes (Signed)
  Subjective:    Patient ID: Amy Barry, female    DOB: 20-Aug-1970, 42 y.o.   MRN: [id number]  HPI 42 yr old female for a cpx. She feels well except for occasional pain in the right knee. This feels like it is beneath the kneecap. No trauma hx. No swelling. She does not exercise but is on her feet a lot on her job.    Review of Systems  Constitutional: Negative.   HENT: Negative.   Eyes: Negative.   Respiratory: Negative.   Cardiovascular: Negative.   Gastrointestinal: Negative.   Genitourinary: Negative for dysuria, urgency, frequency, hematuria, flank pain, decreased urine volume, enuresis, difficulty urinating, pelvic pain and dyspareunia.  Musculoskeletal: Negative.   Skin: Negative.   Neurological: Negative.   Psychiatric/Behavioral: Negative.        Objective:   Physical Exam  Constitutional: She is oriented to person, place, and time. She appears well-developed and well-nourished. No distress.  HENT:  Head: Normocephalic and atraumatic.  Right Ear: External ear normal.  Left Ear: External ear normal.  Nose: Nose normal.  Mouth/Throat: Oropharynx is clear and moist. No oropharyngeal exudate.  Eyes: Conjunctivae and EOM are normal. Pupils are equal, round, and reactive to light. No scleral icterus.  Neck: Normal range of motion. Neck supple. No JVD present. No thyromegaly present.  Cardiovascular: Normal rate, regular rhythm, normal heart sounds and intact distal pulses.  Exam reveals no gallop and no friction rub.   Her soft 2/6 SM over the aortic area is stable   Pulmonary/Chest: Effort normal and breath sounds normal. No respiratory distress. She has no wheezes. She has no rales. She exhibits no tenderness.  Abdominal: Soft. Bowel sounds are normal. She exhibits no distension and no mass. There is no tenderness. There is no rebound and no guarding.  Musculoskeletal: Normal range of motion. She exhibits no edema and no tenderness.  Lymphadenopathy:    She has no  cervical adenopathy.  Neurological: She is alert and oriented to person, place, and time. She has normal reflexes. No cranial nerve deficit. She exhibits normal muscle tone. Coordination normal.  Skin: Skin is warm and dry. No rash noted. No erythema.  Psychiatric: She has a normal mood and affect. Her behavior is normal. Judgment and thought content normal.          Assessment & Plan:  Well exam. Try Aleve for the knee pain. Losing a little weight should help. Recheck prn

## 2012-06-04 ENCOUNTER — Ambulatory Visit (INDEPENDENT_AMBULATORY_CARE_PROVIDER_SITE_OTHER): Payer: BC Managed Care – PPO | Admitting: Family Medicine

## 2012-06-04 ENCOUNTER — Encounter: Payer: Self-pay | Admitting: Family Medicine

## 2012-06-04 VITALS — BP 160/98 | HR 86 | Temp 98.1°F | Wt 142.0 lb

## 2012-06-04 DIAGNOSIS — I10 Essential (primary) hypertension: Secondary | ICD-10-CM

## 2012-06-04 MED ORDER — AMLODIPINE BESYLATE 10 MG PO TABS: 10.0000 mg | ORAL_TABLET | Freq: Every day | ORAL | Status: DC

## 2012-06-04 MED ORDER — IBUPROFEN 800 MG PO TABS: 800.0000 mg | ORAL_TABLET | Freq: Three times a day (TID) | ORAL | Status: DC | PRN

## 2012-06-04 NOTE — Progress Notes (Signed)
  Subjective:    Patient ID: Amy Barry, female    DOB: [birth year], 42 y.o.   MRN: [id number]  HPI Here for elevated BPs. She was seen here last month for a cpx and her BP was fine. However at home in the past few weeks she has had spells of feeling flush in the face, of pressure in her head, and of SOB. Her BP yesterday at church was 186/104, and then last night at home it was 168/102. Today she feels a little better.    Review of Systems  Constitutional: Negative.   Respiratory: Positive for shortness of breath. Negative for cough, chest tightness and wheezing.   Cardiovascular: Negative.        Objective:   Physical Exam  Constitutional: She is oriented to person, place, and time. She appears well-developed and well-nourished. No distress.  Neck: No thyromegaly present.  Cardiovascular: Normal rate, regular rhythm, normal heart sounds and intact distal pulses.   Pulmonary/Chest: Effort normal and breath sounds normal.  Lymphadenopathy:    She has no cervical adenopathy.  Neurological: She is alert and oriented to person, place, and time.          Assessment & Plan:  We will add Amlodipine to her current meds. Recheck in 3 weeks. Out of work today and tomorrow.

## 2012-06-19 ENCOUNTER — Telehealth: Payer: Self-pay | Admitting: Family Medicine

## 2012-06-19 ENCOUNTER — Emergency Department (HOSPITAL_COMMUNITY): Payer: BC Managed Care – PPO

## 2012-06-19 ENCOUNTER — Ambulatory Visit: Payer: Self-pay | Admitting: Family Medicine

## 2012-06-19 ENCOUNTER — Emergency Department (HOSPITAL_COMMUNITY)
Admission: EM | Admit: 2012-06-19 | Discharge: 2012-06-19 | Disposition: A | Discharge status: Home / Self Care | Payer: BC Managed Care – PPO | Attending: Emergency Medicine | Admitting: Emergency Medicine

## 2012-06-19 ENCOUNTER — Encounter (HOSPITAL_COMMUNITY): Payer: Self-pay | Admitting: *Deleted

## 2012-06-19 DIAGNOSIS — Z8669 Personal history of other diseases of the nervous system and sense organs: Secondary | ICD-10-CM | POA: Insufficient documentation

## 2012-06-19 DIAGNOSIS — Z862 Personal history of diseases of the blood and blood-forming organs and certain disorders involving the immune mechanism: Secondary | ICD-10-CM | POA: Insufficient documentation

## 2012-06-19 DIAGNOSIS — K219 Gastro-esophageal reflux disease without esophagitis: Secondary | ICD-10-CM | POA: Insufficient documentation

## 2012-06-19 DIAGNOSIS — R011 Cardiac murmur, unspecified: Secondary | ICD-10-CM | POA: Insufficient documentation

## 2012-06-19 DIAGNOSIS — Z79899 Other long term (current) drug therapy: Secondary | ICD-10-CM | POA: Insufficient documentation

## 2012-06-19 DIAGNOSIS — R51 Headache: Secondary | ICD-10-CM

## 2012-06-19 DIAGNOSIS — Z8739 Personal history of other diseases of the musculoskeletal system and connective tissue: Secondary | ICD-10-CM | POA: Insufficient documentation

## 2012-06-19 DIAGNOSIS — R42 Dizziness and giddiness: Secondary | ICD-10-CM | POA: Insufficient documentation

## 2012-06-19 DIAGNOSIS — H539 Unspecified visual disturbance: Secondary | ICD-10-CM | POA: Insufficient documentation

## 2012-06-19 DIAGNOSIS — Z8679 Personal history of other diseases of the circulatory system: Secondary | ICD-10-CM | POA: Insufficient documentation

## 2012-06-19 DIAGNOSIS — I10 Essential (primary) hypertension: Secondary | ICD-10-CM | POA: Insufficient documentation

## 2012-06-19 DIAGNOSIS — R110 Nausea: Secondary | ICD-10-CM | POA: Insufficient documentation

## 2012-06-19 LAB — URINALYSIS, ROUTINE W REFLEX MICROSCOPIC
Nitrite: NEGATIVE
Specific Gravity, Urine: 1.034 — ABNORMAL HIGH (ref 1.005–1.030)
Urobilinogen, UA: 1 mg/dL (ref 0.0–1.0)

## 2012-06-19 LAB — URINE MICROSCOPIC-ADD ON

## 2012-06-19 LAB — BASIC METABOLIC PANEL
GFR calc Af Amer: >90 mL/min (ref >90)
GFR calc non Af Amer: >90 mL/min (ref >90)
Potassium: 3.7 mEq/L (ref 3.5–5.1)
Sodium: 139 mEq/L (ref 135–145)

## 2012-06-19 LAB — CBC
Hemoglobin: 12.3 g/dL (ref 12.0–15.0)
MCHC: 33.8 g/dL (ref 30.0–36.0)
Platelets: 280 10*3/uL (ref 150–400)
RDW: 11.9 % (ref 11.5–15.5)

## 2012-06-19 MED ORDER — IOHEXOL 350 MG/ML SOLN
100.0000 mL | Freq: Once | INTRAVENOUS | Status: AC | PRN
  Administered 2012-06-19: 80 mL via INTRAVENOUS

## 2012-06-19 MED ORDER — SODIUM CHLORIDE 0.9 % IV BOLUS (SEPSIS)
1000.0000 mL | Freq: Once | INTRAVENOUS | Status: AC
  Administered 2012-06-19: 1000 mL via INTRAVENOUS

## 2012-06-19 MED ORDER — METOCLOPRAMIDE HCL 5 MG/ML IJ SOLN
20.0000 mg | Freq: Once | INTRAVENOUS | Status: AC
  Administered 2012-06-19: 20 mg via INTRAVENOUS
  Filled 2012-06-19: qty 4

## 2012-06-19 MED ORDER — DEXAMETHASONE SODIUM PHOSPHATE 10 MG/ML IJ SOLN
10.0000 mg | Freq: Once | INTRAMUSCULAR | Status: AC
  Administered 2012-06-19: 10 mg via INTRAVENOUS
  Filled 2012-06-19: qty 1

## 2012-06-19 MED ORDER — KETOROLAC TROMETHAMINE 30 MG/ML IJ SOLN
30.0000 mg | Freq: Once | INTRAMUSCULAR | Status: AC
  Administered 2012-06-19: 30 mg via INTRAVENOUS
  Filled 2012-06-19: qty 1

## 2012-06-19 MED ORDER — DIPHENHYDRAMINE HCL 50 MG/ML IJ SOLN
25.0000 mg | Freq: Once | INTRAMUSCULAR | Status: AC
  Administered 2012-06-19: 25 mg via INTRAVENOUS
  Filled 2012-06-19: qty 1

## 2012-06-19 NOTE — Telephone Encounter (Signed)
Notified patient of Dr. Claris Che recommendation for her to go to ED immediately.  Patient voiced her understanding and will go to ED now.

## 2012-06-19 NOTE — ED Notes (Signed)
Pt c/o HA and sharp shooting pains in left side of head. Pt reports family hx of anyuersm.

## 2012-06-19 NOTE — ED Provider Notes (Signed)
History     CSN: [id number]  Arrival date & time 06/19/12  1536   First MD Initiated Contact with Patient 06/19/12 1634      Chief Complaint  Patient presents with  . Headache    (Consider location/radiation/quality/duration/timing/severity/associated sxs/prior treatment) HPI  The patient pw HA.  This episode began ~3d pta, while changing clothes.  This episode is similar but more severe than two recent HA.  Since onset there has been severe pain in L-hemi-occiput w radiation to L face / forehead.  The pain is "burning" / sharp and there is associated dysesthesia, but no sensory loss in the distribution.  There is also dysequilibrium and nausea.  No emesis, no diarrhea, no confusion.  There is "blurry" vision, but no loss of vision on the L. No Hx of chronic HA, nor migraines.    +Hx of aneurism in family.  Past Medical History  Diagnosis Date  . Anemia   . GERD (gastroesophageal reflux disease)   . Headache   . Hypertension   . Osteoarthritis   . Seizure disorder   . Heart murmur   . PVC (premature ventricular contraction)     Past Surgical History  Procedure Laterality Date  . 2 ovarian cysts      age 42  . Esophagogastroduodenoscopy      for GERD  . Tubal ligation      Family History  Problem Relation Age of Onset  . Arthritis    . Hyperlipidemia    . Hypertension Daughter   . Stroke    . Leukemia Maternal Grandmother   . Asthma Sister   . Heart disease Maternal Grandmother     History  Substance Use Topics  . Smoking status: Never Smoker   . Smokeless tobacco: Never Used  . Alcohol Use: No    OB History   Grav Para Term Preterm Abortions TAB SAB Ect Mult Living                  Review of Systems  Constitutional:       Per HPI, otherwise negative  HENT:       Per HPI, otherwise negative  Eyes: Positive for visual disturbance.  Respiratory:       Per HPI, otherwise negative  Cardiovascular:       Per HPI, otherwise negative   Gastrointestinal: Positive for nausea. Negative for vomiting and abdominal pain.  Endocrine:       Negative aside from HPI  Genitourinary:       Neg aside from HPI   Musculoskeletal:       Per HPI, otherwise negative  Skin: Negative.   Neurological: Positive for dizziness and headaches. Negative for tremors, seizures, syncope, facial asymmetry, speech difficulty, weakness, light-headedness and numbness.    Allergies  Meperidine hcl; Metoclopramide hcl; Oxycodone-acetaminophen; Propoxyphene-acetaminophen; Sumatriptan; and Codeine  Home Medications   Current Outpatient Rx  Name  Route  Sig  Dispense  Refill  . amLODipine (NORVASC) 10 MG tablet   Oral   Take 10 mg by mouth every morning.         . furosemide (LASIX) 40 MG tablet   Oral   Take 20 mg by mouth every morning.         Marland Kitchen ibuprofen (ADVIL,MOTRIN) 800 MG tablet   Oral   Take 800 mg by mouth every 8 (eight) hours as needed for pain (or headache).         . metoprolol (LOPRESSOR) 100 MG tablet  Oral   Take 100 mg by mouth 2 (two) times daily.         . Multiple Vitamin (MULTIVITAMIN WITH MINERALS) TABS   Oral   Take 1 tablet by mouth every morning.         Marland Kitchen omeprazole (PRILOSEC) 40 MG capsule   Oral   Take 40 mg by mouth daily as needed (for acid reflux).          . potassium chloride (K-DUR) 10 MEQ tablet   Oral   Take 1 tablet (10 mEq total) by mouth 2 (two) times daily.   60 tablet   11     BP 138/83  Pulse 85  Temp(Src) 99 F (37.2 C) (Oral)  Resp 16  SpO2 100%  LMP 06/19/2012  Physical Exam  Nursing note and vitals reviewed. Constitutional: She is oriented to person, place, and time. She appears well-developed and well-nourished. No distress.  HENT:  Head: Normocephalic and atraumatic.  Eyes: Conjunctivae and EOM are normal.  Cardiovascular: Normal rate and regular rhythm.   Pulmonary/Chest: Effort normal and breath sounds normal. No stridor. No respiratory distress.   Abdominal: She exhibits no distension.  Musculoskeletal: She exhibits no edema.  Neurological: She is alert and oriented to person, place, and time. She displays no atrophy and no tremor. No cranial nerve deficit or sensory deficit. She exhibits normal muscle tone. She displays no seizure activity.  Minor dysmetria on L  Skin: Skin is warm and dry.  Psychiatric: She has a normal mood and affect.    ED Course  Procedures (including critical care time)  Labs Reviewed  CBC  BASIC METABOLIC PANEL  URINALYSIS, ROUTINE W REFLEX MICROSCOPIC   No results found.   No diagnosis found.  7:06 PM Patient resting HA resolved.  Results d/w the patient and her friend  MDM  Patient presents with concerns of headache, with new neurologic complaints.  No other complaints are largely subjective, given her history of hypertension, and family history of aneurysms, the patient had angiography as well as plain CT.  Results were largely reassuring.  The patient's pain improved entirely.  She was discharged in stable condition with instructions to follow up with her primary care physician for additional management of her blood pressure and headache.        Gerhard Munch, MD 06/19/12 [phone]

## 2012-06-19 NOTE — Telephone Encounter (Signed)
Patient Information:  Caller Name: Diahann  Phone: [phone]  Patient: Amy Barry  Gender: Female  DOB: Nov 03, 1970  Age: 42 Years  PCP: Gershon Crane [facility])  Pregnant: No  Office Follow Up:  Does the office need to follow up with this patient?: Yes  Instructions For The Office: Review appt time with Dr. Clent Ridges Delories Heinz is aware)  RN Note:  Talked with Tim Lair in office and reported patient symptoms. Scheduled today 06/19/12 at 15:00 with Dr.Fry pending approval. Patient is aware of office time and in agreement. Care instructions given along with call back parameters. Understanding expressed.  Symptoms  Reason For Call & Symptoms: Patient states she is having pain in her head. Location in center in the back of her head on the left side that radiates to the front. Ongoing x4 days.   She reports that she does have HTN and her medication was changed 06/04/12 and is currently on Norvasc, Lopressor and Lasix. B/p yesterday was 133/104.  Occasionally she will have facial burning on left side, facial tenderness and blurred vision  Reviewed Health History In EMR: Yes  Reviewed Medications In EMR: Yes  Reviewed Allergies In EMR: Yes  Reviewed Surgeries / Procedures: No  Date of Onset of Symptoms: 06/15/2012  Treatments Tried: Ibuprofen 800mg  every 8 hours  Treatments Tried Worked: No OB / GYN:  LMP: 06/19/2012  Guideline(s) Used:  Headache  Disposition Per Guideline:   Go to ED Now (or to Office with PCP Approval)  Reason For Disposition Reached:   Severe headache, states "worst headache" of life  Advice Given:  Migraine Medication:   If your doctor has prescribed specific medication for your migraine, take it as directed as soon as the migraine starts.  Rest:   Lie down in a dark, quiet place and try to relax. Close your eyes and imagine your entire body relaxing.  Rest:   Lie down in a dark, quiet place and try to relax. Close your eyes and imagine your entire body  relaxing.  Apply Cold to the Area:   Apply a cold wet washcloth or cold pack to the forehead for 20 minutes.  Call Back If:  Headache lasts longer than 24 hours  You become worse.  Patient Will Follow Care Advice:  YES  Appointment Scheduled:  06/19/2012 15:00:00 Appointment Scheduled Provider:  Gershon Crane [facility])

## 2012-06-27 ENCOUNTER — Ambulatory Visit (INDEPENDENT_AMBULATORY_CARE_PROVIDER_SITE_OTHER): Payer: BC Managed Care – PPO | Admitting: Family Medicine

## 2012-06-27 ENCOUNTER — Ambulatory Visit: Payer: BC Managed Care – PPO | Admitting: Family Medicine

## 2012-06-27 ENCOUNTER — Encounter: Payer: Self-pay | Admitting: Family Medicine

## 2012-06-27 VITALS — BP 114/72 | HR 82 | Temp 99.4°F | Wt 142.0 lb

## 2012-06-27 DIAGNOSIS — M542 Cervicalgia: Secondary | ICD-10-CM

## 2012-06-27 DIAGNOSIS — G8929 Other chronic pain: Secondary | ICD-10-CM

## 2012-06-27 DIAGNOSIS — R51 Headache: Secondary | ICD-10-CM

## 2012-06-27 MED ORDER — METHYLPREDNISOLONE 4 MG PO KIT: PACK | ORAL | Status: AC

## 2012-06-27 NOTE — Progress Notes (Signed)
  Subjective:    Patient ID: Amy Barry, female    DOB: 1970/04/08, 42 y.o.   MRN: [id number]  HPI Here for continued HAs which are centered in the left posterior head and left neck. These are constant but they wax and wane. They can be severe at times. She was seen in the ER on 06-19-12 and had a normal CT angiogram of the head. We had gotten an MRI of her cervical spine last July and this showed only minor disc disease. She has tried PT with no effect. No other neurologic deficits. Her BP has been well controlled.    Review of Systems  Constitutional: Negative.   HENT: Positive for neck pain and neck stiffness.   Eyes: Negative.   Neurological: Positive for headaches. Negative for dizziness, tremors, seizures, syncope, facial asymmetry, speech difficulty, weakness, light-headedness and numbness.       Objective:   Physical Exam  Constitutional: She is oriented to person, place, and time. She appears well-developed and well-nourished. No distress.  HENT:  Head: Normocephalic and atraumatic.  Right Ear: External ear normal.  Left Ear: External ear normal.  Nose: Nose normal.  Mouth/Throat: Oropharynx is clear and moist.  Eyes: Conjunctivae and EOM are normal. Pupils are equal, round, and reactive to light.  Neck: No thyromegaly present.  She is very tender in the left posterior neck around the C2 to C4 area with spasm and reduced ROM.   Lymphadenopathy:    She has no cervical adenopathy.  Neurological: She is alert and oriented to person, place, and time. No cranial nerve deficit.          Assessment & Plan:  Chronic tension HAs related to neck pain. She may possibly benefit from steroid injections or nerve blocks. We will refer to Physical Med Rehab to evaluate. Given some oral steroids today

## 2012-07-12 ENCOUNTER — Encounter: Payer: Self-pay | Admitting: Physical Medicine & Rehabilitation

## 2012-08-06 ENCOUNTER — Ambulatory Visit (HOSPITAL_BASED_OUTPATIENT_CLINIC_OR_DEPARTMENT_OTHER): Payer: BC Managed Care – PPO | Admitting: Physical Medicine & Rehabilitation

## 2012-08-06 ENCOUNTER — Encounter: Payer: Self-pay | Admitting: Physical Medicine & Rehabilitation

## 2012-08-06 ENCOUNTER — Encounter: Payer: BC Managed Care – PPO | Attending: Physical Medicine & Rehabilitation

## 2012-08-06 VITALS — BP 129/86 | HR 90 | Resp 14 | Ht 61.0 in | Wt 145.0 lb

## 2012-08-06 DIAGNOSIS — Z79899 Other long term (current) drug therapy: Secondary | ICD-10-CM | POA: Insufficient documentation

## 2012-08-06 DIAGNOSIS — IMO0001 Reserved for inherently not codable concepts without codable children: Secondary | ICD-10-CM | POA: Insufficient documentation

## 2012-08-06 DIAGNOSIS — M7918 Myalgia, other site: Secondary | ICD-10-CM

## 2012-08-06 DIAGNOSIS — Z5181 Encounter for therapeutic drug level monitoring: Secondary | ICD-10-CM

## 2012-08-06 DIAGNOSIS — R209 Unspecified disturbances of skin sensation: Secondary | ICD-10-CM | POA: Insufficient documentation

## 2012-08-06 DIAGNOSIS — I10 Essential (primary) hypertension: Secondary | ICD-10-CM | POA: Insufficient documentation

## 2012-08-06 DIAGNOSIS — R51 Headache: Secondary | ICD-10-CM | POA: Insufficient documentation

## 2012-08-06 DIAGNOSIS — M1990 Unspecified osteoarthritis, unspecified site: Secondary | ICD-10-CM | POA: Insufficient documentation

## 2012-08-06 DIAGNOSIS — M542 Cervicalgia: Secondary | ICD-10-CM | POA: Insufficient documentation

## 2012-08-06 DIAGNOSIS — K219 Gastro-esophageal reflux disease without esophagitis: Secondary | ICD-10-CM | POA: Insufficient documentation

## 2012-08-06 MED ORDER — CYCLOBENZAPRINE HCL 5 MG PO TABS: 5.0000 mg | ORAL_TABLET | Freq: Every day | ORAL | Status: DC

## 2012-08-06 MED ORDER — TRAMADOL HCL 50 MG PO TABS: 50.0000 mg | ORAL_TABLET | Freq: Three times a day (TID) | ORAL | Status: DC | PRN

## 2012-08-06 NOTE — Progress Notes (Signed)
Subjective:    Patient ID: Amy Barry, female    DOB: 06-02-1970, 42 y.o.   MRN: [id number]  HPI One year history of insidious onset neck pain with headaches and right arm pain. Also gets some left-sided headache pain. Right arm numb as well as weak.All fingers feel numb, arm feels hot. Drops objects. Has gone through physical therapy. Has tried TENS unit, ice, massage, traction, arm and shoulder exercises. Therapy completed late summer 2013. MRI of the cervical spine in July 2013 showed minimal midline disc protrusion C4-5 C5-6 which did not cause any nerve impingement. No significant facet arthropathy noted. Underwent CT angiogram of the head in March of 2014 which was normal. No hx of carpal tunnel  No fevers or weight loss, no bowel or bladder issues Pain Inventory Average Pain 10 Pain Right Now 4 My pain is sharp, tingling and aching  In the last 24 hours, has pain interfered with the following? General activity 4 Relation with others 4 Enjoyment of life 4 What TIME of day is your pain at its worst? daytime Sleep (in general) Fair  Pain is worse with: some activites Pain improves with: rest, heat/ice and medication Relief from Meds: 3  Mobility walk without assistance ability to climb steps?  yes do you drive?  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  no  Physicians involved in your care Any changes since last visit?  no   Family History  Problem Relation Age of Onset  . Arthritis    . Hyperlipidemia    . Hypertension Daughter   . Stroke    . Leukemia Maternal Grandmother   . Asthma Sister   . Heart disease Maternal Grandmother    History   Social History  . Marital Status: Married    Spouse Name: N/A    Number of Children: N/A  . Years of Education: N/A   Occupational History  . RN Community education officer   Social History Main Topics  . Smoking  status: Never Smoker   . Smokeless tobacco: Never Used  . Alcohol Use: No  . Drug Use: No  . Sexually Active: None   Other Topics Concern  . None   Social History Narrative  . None   Past Surgical History  Procedure Laterality Date  . 2 ovarian cysts      age 58  . Esophagogastroduodenoscopy      for GERD  . Tubal ligation     Past Medical History  Diagnosis Date  . Anemia   . GERD (gastroesophageal reflux disease)   . Headache   . Hypertension   . Osteoarthritis   . Seizure disorder   . Heart murmur   . PVC (premature ventricular contraction)    BP 129/86  Pulse 90  Resp 14  Ht 5\' 1"  (1.549 m)  Wt 145 lb (65.772 kg)  BMI 27.41 kg/m2  SpO2 100%    Review of Systems  HENT: Positive for neck pain.   Gastrointestinal: Positive for nausea.  Neurological: Positive for numbness and headaches.       Tingling  All other systems reviewed and are negative.       Objective:   Physical Exam  Nursing note and vitals reviewed. Constitutional: She appears well-developed and well-nourished.  HENT:  Head: Normocephalic and atraumatic.  Eyes: Conjunctivae and EOM are normal. Pupils are equal, round, and reactive to  light.  Neck: Normal range of motion.  Musculoskeletal:       Cervical back: She exhibits tenderness. She exhibits no bony tenderness.  Tenderness to palpation right greater than left trapezius, right medial scapula border, right infraspinatus  Neurological: She is alert. She has normal strength. A sensory deficit is present. Coordination and gait normal.  Reflex Scores:      Tricep reflexes are 3+ on the right side and 3+ on the left side.      Bicep reflexes are 3+ on the right side and 3+ on the left side.      Brachioradialis reflexes are 3+ on the right side and 3+ on the left side.      Patellar reflexes are 3+ on the right side and 3+ on the left side.      Achilles reflexes are 2+ on the right side and 2+ on the left side. R 2nd and 3rd finger pp  deficit  Psychiatric: She has a normal mood and affect.          Assessment & Plan:  1. Myofascial pain syndrome. I reviewed her cervical MRI does not appear to have any compressive lesions or significant disc degeneration. It is possible that she may have cervical facet mediated pain without much evidence of cervical facet arthropathy. Will start out with tramadol 50 3 times a day with Flexeril at night. Trigger point injections as this is not helpful. 2. Right hand paresthesias she gives a history of repetitive use. 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 Her cakes are very labor intensive. Will check for carpal tunnel with EMG and NCV We discussed general fitness as well as specific strengthening of the upper back muscles. I recommended theraband exercise wraparound a knob and squeeze her scapula together 2 or 3 sets of 10 every day or every other day.

## 2012-08-06 NOTE — Patient Instructions (Signed)
Purchase a theraband rapid around door knob and pullback trying to pinch her shoulder blades together 10 repetitions 2 or 3 sets do this either everyday or every other day.

## 2012-08-30 ENCOUNTER — Ambulatory Visit: Payer: BC Managed Care – PPO | Admitting: Physical Medicine & Rehabilitation

## 2012-09-03 ENCOUNTER — Telehealth: Payer: Self-pay | Admitting: Family Medicine

## 2012-09-03 NOTE — Telephone Encounter (Signed)
Either she could see [name] tomorrow morning or she could go to Urgent Care tonight

## 2012-09-03 NOTE — Telephone Encounter (Signed)
Patient Information:  Caller Name: Quanasia  Phone: [phone]  Patient: Amy Barry, Amy Barry  Gender: Female  DOB: 08-14-1970  Age: 42 Years  PCP: Gershon Crane [facility])  Pregnant: No  Office Follow Up:  Does the office need to follow up with this patient?: Yes  Instructions For The Office: Please call ASAP regarding wrok in appointment for 09/03/12.  RN Note:  BTL.  Reports intermittent, mild, lower abdominal pain rated 4-5/10 for past week. No appointments remain for 09/03/12.  Message sent to office staff for call back.   Symptoms  Reason For Call & Symptoms: Vaginal discharge and odor with possible UTI with  urgency, darker color, urine odor and  low backache. Continues to have light spotting 2 weeks after menses began.  Reviewed Health History In EMR: Yes  Reviewed Medications In EMR: Yes  Reviewed Allergies In EMR: Yes  Reviewed Surgeries / Procedures: Yes  Date of Onset of Symptoms: 08/22/2012 OB / GYN:  LMP: 08/20/2012  Guideline(s) Used:  Vaginal Discharge  Disposition Per Guideline:   See Today in Office  Reason For Disposition Reached:   Mild lower abdominal pain comes and goes (cramps) that lasts > 24 hours  Advice Given:  Call Back If:  You become worse.  Patient Will Follow Care Advice:  YES

## 2012-09-03 NOTE — Telephone Encounter (Signed)
I spoke with pt and she is going to come in on 09/04/12 for here.

## 2012-09-04 ENCOUNTER — Ambulatory Visit (INDEPENDENT_AMBULATORY_CARE_PROVIDER_SITE_OTHER): Payer: BC Managed Care – PPO | Admitting: Family Medicine

## 2012-09-04 ENCOUNTER — Encounter: Payer: Self-pay | Admitting: Family Medicine

## 2012-09-04 VITALS — BP 160/80 | HR 95 | Temp 98.6°F | Wt 146.0 lb

## 2012-09-04 DIAGNOSIS — N760 Acute vaginitis: Secondary | ICD-10-CM

## 2012-09-04 DIAGNOSIS — B9689 Other specified bacterial agents as the cause of diseases classified elsewhere: Secondary | ICD-10-CM

## 2012-09-04 DIAGNOSIS — N898 Other specified noninflammatory disorders of vagina: Secondary | ICD-10-CM

## 2012-09-04 DIAGNOSIS — A499 Bacterial infection, unspecified: Secondary | ICD-10-CM

## 2012-09-04 LAB — POCT URINALYSIS DIPSTICK
Nitrite, UA: NEGATIVE
Spec Grav, UA: 1.025
Urobilinogen, UA: 0.2
pH, UA: 7

## 2012-09-04 MED ORDER — METRONIDAZOLE 500 MG PO TABS: 500.0000 mg | ORAL_TABLET | Freq: Three times a day (TID) | ORAL | Status: DC

## 2012-09-04 NOTE — Progress Notes (Signed)
  Subjective:    Patient ID: Amy Barry, female    DOB: January 21, 1971, 42 y.o.   MRN: [id number]  HPI Here for 3 weeks of a clear vaginal DC which has a foul fishy odor. Some mild lower abdominal cramps. No fever. No UTI symptoms.    Review of Systems  Constitutional: Negative.   Gastrointestinal: Negative.   Genitourinary: Positive for vaginal discharge. Negative for dysuria, frequency, hematuria and flank pain.       Objective:   Physical Exam  Constitutional: She appears well-developed and well-nourished.  Abdominal: Soft. Bowel sounds are normal. She exhibits no distension and no mass. There is no tenderness. There is no rebound and no guarding.          Assessment & Plan:  Recheck prn

## 2012-09-14 ENCOUNTER — Telehealth: Payer: Self-pay | Admitting: Family Medicine

## 2012-09-14 ENCOUNTER — Encounter: Payer: Self-pay | Admitting: Family Medicine

## 2012-09-14 ENCOUNTER — Ambulatory Visit (INDEPENDENT_AMBULATORY_CARE_PROVIDER_SITE_OTHER): Payer: BC Managed Care – PPO | Admitting: Family Medicine

## 2012-09-14 VITALS — BP 140/70 | HR 94 | Temp 98.1°F

## 2012-09-14 DIAGNOSIS — A499 Bacterial infection, unspecified: Secondary | ICD-10-CM

## 2012-09-14 DIAGNOSIS — N760 Acute vaginitis: Secondary | ICD-10-CM

## 2012-09-14 DIAGNOSIS — B9689 Other specified bacterial agents as the cause of diseases classified elsewhere: Secondary | ICD-10-CM

## 2012-09-14 NOTE — Telephone Encounter (Signed)
Patient Information:  Caller Name: Burkley  Phone: [phone]  Patient: Amy Barry, Amy Barry  Gender: Female  DOB: Apr 01, 1971  Age: 42 Years  PCP: Gershon Crane [facility])  Pregnant: No  Office Follow Up:  Does the office need to follow up with this patient?: No  Instructions For The Office: N/A  RN Note:  Patient states she was seen in the office 09/04/12 for vaginal discharge. States she was diagnosed with bacterial vaginosis and prescribed Flagyl. Patient states she developed nausea after starting Flagyl. Patient states she completed Flagyl on 09/12/12 but nausea has persisted and increased 09/14/12. No vomiting. Afebrile. No diarrhea. Denies urinary sx. Patient states she continues to have vaginal discharge when wiping. States vaginal discharge continues to have a foul odor. Patient states she does not think she can tolerate taking Flagyl again due to nausea. Patient states she developed lower abdominal cramping 09/14/12. States she has had intermittent lower abodminal pain  24 hours. Care advice given per guidelines. Patient advised clear fluids/bland foods. Call back parameters reviewed. Patient verbalizes understanding.  Symptoms  Reason For Call & Symptoms: Vaginal Discharge/Nausea  Reviewed Health History In EMR: Yes  Reviewed Medications In EMR: Yes  Reviewed Allergies In EMR: Yes  Reviewed Surgeries / Procedures: Yes  Date of Onset of Symptoms: 08/14/2012  Treatments Tried: Flagyl  Treatments Tried Worked: No OB / GYN:  LMP: 09/04/2012  Guideline(s) Used:  Vaginal Discharge  Disposition Per Guideline:   See Today in Office  Reason For Disposition Reached:   Mild lower abdominal pain comes and goes (cramps) that lasts > 24 hours  Advice Given:  Call Back If:  You become worse.  Patient Will Follow Care Advice:  YES  Appointment Scheduled:  09/14/2012 13:45:00 Appointment Scheduled Provider:  Gershon Crane [facility])

## 2012-09-14 NOTE — Progress Notes (Signed)
  Subjective:    Patient ID: Amy Barry, female    DOB: 06/14/1970, 42 y.o.   MRN: [id number]  HPI Here to ask advice about her recent treatment for bacterial vaginitis. She came in last week for a foul smelling discharge and was given 7 days of Metronidazole. All these symptoms have resolved but she asks whether she could tell if this was gone or not. She started her menses earlier this week and has had normal bleeding. Today she feels fine otherwise. The Metronidazole caused her to be nauseated.    Review of Systems  Constitutional: Negative.   Genitourinary: Negative.        Objective:   Physical Exam  Constitutional: She appears well-developed and well-nourished.  Abdominal: Soft. Bowel sounds are normal. She exhibits no distension and no mass. There is no tenderness. There is no rebound and no guarding.          Assessment & Plan:  Her BV seems to be gone. If she starts to have a DC again, we would consider treating with a vaginal gel rather than orally.

## 2012-09-20 ENCOUNTER — Other Ambulatory Visit: Payer: Self-pay | Admitting: Family Medicine

## 2013-04-18 ENCOUNTER — Encounter: Payer: Self-pay | Admitting: Family Medicine

## 2013-04-18 ENCOUNTER — Ambulatory Visit (INDEPENDENT_AMBULATORY_CARE_PROVIDER_SITE_OTHER): Payer: BC Managed Care – PPO | Admitting: Family Medicine

## 2013-04-18 VITALS — BP 122/80 | HR 115 | Temp 100.0°F | Wt 154.0 lb

## 2013-04-18 DIAGNOSIS — J329 Chronic sinusitis, unspecified: Secondary | ICD-10-CM

## 2013-04-18 DIAGNOSIS — J069 Acute upper respiratory infection, unspecified: Secondary | ICD-10-CM

## 2013-04-18 DIAGNOSIS — R509 Fever, unspecified: Secondary | ICD-10-CM

## 2013-04-18 LAB — POCT INFLUENZA A/B
INFLUENZA A, POC: NEGATIVE
INFLUENZA B, POC: NEGATIVE

## 2013-04-18 MED ORDER — AZITHROMYCIN 250 MG PO TABS: ORAL_TABLET | ORAL | Status: DC

## 2013-04-18 NOTE — Addendum Note (Signed)
Addended by: Colleen Can on: 04/18/2013 04:47 PM   Modules accepted: Orders

## 2013-04-18 NOTE — Progress Notes (Signed)
Chief Complaint  Patient presents with  . Cough    congestion, fever, body aches, chills since Tuesday     HPI:  -started:2 days ago -symptoms:see above and sinus pain, fever high of 102 -denies:fever, SOB, NVD, tooth pain -has tried: northing -sick contacts/travel/risks: denies Ebola risks, boss had similar symptoms   ROS: See pertinent positives and negatives per HPI.  Past Medical History  Diagnosis Date  . Anemia   . GERD (gastroesophageal reflux disease)   . Headache(784.0)   . Hypertension   . Osteoarthritis   . Seizure disorder   . Heart murmur   . PVC (premature ventricular contraction)     Past Surgical History  Procedure Laterality Date  . 2 ovarian cysts      age 43  . Esophagogastroduodenoscopy      for GERD  . Tubal ligation      Family History  Problem Relation Age of Onset  . Arthritis    . Hyperlipidemia    . Hypertension Daughter   . Stroke    . Leukemia Maternal Grandmother   . Asthma Sister   . Heart disease Maternal Grandmother     History   Social History  . Marital Status: Married    Spouse Name: N/A    Number of Children: N/A  . Years of Education: N/A   Occupational History  . RN Airline pilot   Social History Main Topics  . Smoking status: Never Smoker   . Smokeless tobacco: Never Used  . Alcohol Use: No  . Drug Use: No  . Sexual Activity: None   Other Topics Concern  . None   Social History Narrative  . None    Current outpatient prescriptions:amLODipine (NORVASC) 10 MG tablet, Take 10 mg by mouth every morning., Disp: , Rfl: ;  cyclobenzaprine (FLEXERIL) 5 MG tablet, Take 1 tablet (5 mg total) by mouth at bedtime., Disp: 30 tablet, Rfl: 0;  furosemide (LASIX) 40 MG tablet, Take 20 mg by mouth every morning., Disp: , Rfl: ;  ibuprofen (ADVIL,MOTRIN) 800 MG tablet, Take 800 mg by mouth every 8 (eight) hours as needed for pain (or headache)., Disp: , Rfl:  KLOR-CON M10 10 MEQ tablet, TAKE ONE TABLET BY MOUTH EVERY DAY, Disp:  30 tablet, Rfl: 0;  metoprolol (LOPRESSOR) 100 MG tablet, Take 100 mg by mouth 2 (two) times daily., Disp: , Rfl: ;  metroNIDAZOLE (FLAGYL) 500 MG tablet, Take 1 tablet (500 mg total) by mouth 3 (three) times daily., Disp: 21 tablet, Rfl: 0;  Multiple Vitamin (MULTIVITAMIN WITH MINERALS) TABS, Take 1 tablet by mouth every morning., Disp: , Rfl:  omeprazole (PRILOSEC) 40 MG capsule, Take 40 mg by mouth daily as needed (for acid reflux). , Disp: , Rfl: ;  potassium chloride (K-DUR) 10 MEQ tablet, Take 1 tablet (10 mEq total) by mouth 2 (two) times daily., Disp: 60 tablet, Rfl: 11;  traMADol (ULTRAM) 50 MG tablet, Take 1 tablet (50 mg total) by mouth every 8 (eight) hours as needed for pain., Disp: 90 tablet, Rfl: 1 azithromycin (ZITHROMAX) 250 MG tablet, 2 tabs first day then 1 tab daily for 4 more days, Disp: 6 tablet, Rfl: 0  EXAM:  Filed Vitals:   04/18/13 1426  BP: 122/80  Pulse: 115  Temp: 100 F (37.8 C)  O2 95  Body mass index is 29.11 kg/(m^2).  GENERAL: vitals reviewed and listed above, alert, oriented, appears well hydrated and in no acute distress  HEENT: atraumatic, conjunttiva clear, no obvious abnormalities  on inspection of external nose and ears, normal appearance of ear canals and TMs, clear nasal congestion, mild post oropharyngeal erythema with PND, no tonsillar edema or exudate, no sinus TTP  NECK: no obvious masses on inspection  LUNGS: clear to auscultation bilaterally, no wheezes, rales or rhonchi, good air movement  CV: HRRR, no peripheral edema  MS: moves all extremities without noticeable abnormality  PSYCH: pleasant and cooperative, no obvious depression or anxiety  ASSESSMENT AND PLAN:  Discussed the following assessment and plan:  Acute upper respiratory infections of unspecified site  Sinusitis - Plan: azithromycin (ZITHROMAX) 250 MG tablet  -given HPI and exam findings today, a serious infection or illness is unlikely. We discussed potential  etiologies, with VURI being most likely, but with low grade temp today and sinus pain will tx with abx in case bacterial -advise could be influenzae despite the neg flu test, but no risks for complications and after discussion she would not wish to do tamiflu -We discussed treatment side effects, likely course, antibiotic misuse, transmission, and signs of developing a serious illness. -of course, we advised to return or notify a doctor immediately if symptoms worsen or persist or new concerns arise.    Patient Instructions  Sinusitis Sinusitis is redness, soreness, and swelling (inflammation) of the paranasal sinuses. Paranasal sinuses are air pockets within the bones of your face (beneath the eyes, the middle of the forehead, or above the eyes). In healthy paranasal sinuses, mucus is able to drain out, and air is able to circulate through them by way of your nose. However, when your paranasal sinuses are inflamed, mucus and air can become trapped. This can allow bacteria and other germs to grow and cause infection. Sinusitis can develop quickly and last only a short time (acute) or continue over a long period (chronic). Sinusitis that lasts for more than 12 weeks is considered chronic.  CAUSES  Causes of sinusitis include:  Allergies.  Structural abnormalities, such as displacement of the cartilage that separates your nostrils (deviated septum), which can decrease the air flow through your nose and sinuses and affect sinus drainage.  Functional abnormalities, such as when the small hairs (cilia) that line your sinuses and help remove mucus do not work properly or are not present. SYMPTOMS  Symptoms of acute and chronic sinusitis are the same. The primary symptoms are pain and pressure around the affected sinuses. Other symptoms include:  Upper toothache.  Earache.  Headache.  Bad breath.  Decreased sense of smell and taste.  A cough, which worsens when you are lying  flat.  Fatigue.  Fever.  Thick drainage from your nose, which often is green and may contain pus (purulent).  Swelling and warmth over the affected sinuses. DIAGNOSIS  Your caregiver will perform a physical exam. During the exam, your caregiver may:  Look in your nose for signs of abnormal growths in your nostrils (nasal polyps).  Tap over the affected sinus to check for signs of infection.  View the inside of your sinuses (endoscopy) with a special imaging device with a light attached (endoscope), which is inserted into your sinuses. If your caregiver suspects that you have chronic sinusitis, one or more of the following tests may be recommended:  Allergy tests.  Nasal culture A sample of mucus is taken from your nose and sent to a lab and screened for bacteria.  Nasal cytology A sample of mucus is taken from your nose and examined by your caregiver to determine if your sinusitis is  related to an allergy. TREATMENT  Most cases of acute sinusitis are related to a viral infection and will resolve on their own within 10 days. Sometimes medicines are prescribed to help relieve symptoms (pain medicine, decongestants, nasal steroid sprays, or saline sprays).  However, for sinusitis related to a bacterial infection, your caregiver will prescribe antibiotic medicines. These are medicines that will help kill the bacteria causing the infection.  Rarely, sinusitis is caused by a fungal infection. In theses cases, your caregiver will prescribe antifungal medicine. For some cases of chronic sinusitis, surgery is needed. Generally, these are cases in which sinusitis recurs more than 3 times per year, despite other treatments. HOME CARE INSTRUCTIONS   Drink plenty of water. Water helps thin the mucus so your sinuses can drain more easily.  Use a humidifier.  Inhale steam 3 to 4 times a day (for example, sit in the bathroom with the shower running).  Apply a warm, moist washcloth to your face 3  to 4 times a day, or as directed by your caregiver.  Use saline nasal sprays to help moisten and clean your sinuses.  Take over-the-counter or prescription medicines for pain, discomfort, or fever only as directed by your caregiver. SEEK IMMEDIATE MEDICAL CARE IF:  You have increasing pain or severe headaches.  You have nausea, vomiting, or drowsiness.  You have swelling around your face.  You have vision problems.  You have a stiff neck.  You have difficulty breathing. MAKE SURE YOU:   Understand these instructions.  Will watch your condition.  Will get help right away if you are not doing well or get worse. Document Released: 03/21/2005 Document Revised: 06/13/2011 Document Reviewed: 04/05/2011 [facility] Patient Information 2014 [place], [place], [place]

## 2013-04-18 NOTE — Progress Notes (Signed)
Pre visit review using our clinic review tool, if applicable. No additional management support is needed unless otherwise documented below in the visit note. 

## 2013-04-18 NOTE — Patient Instructions (Signed)

## 2013-04-22 ENCOUNTER — Telehealth: Payer: Self-pay | Admitting: Family Medicine

## 2013-04-22 MED ORDER — PROMETHAZINE HCL 25 MG PO TABS: 25.0000 mg | ORAL_TABLET | ORAL | Status: DC | PRN

## 2013-04-22 NOTE — Telephone Encounter (Signed)
We can see her tomorrow. In the meantime for nausea call in Phenergan 25 mg q 4 hours prn, #60 with no rf

## 2013-04-22 NOTE — Telephone Encounter (Signed)
Patient Information:  Caller Name: Ayda  Phone: [phone]  Patient: Amy Barry, Amy Barry  Gender: Female  DOB: 10-03-1970  Age: 43 Years  PCP: Alysia Penna [facility])  Pregnant: No  Office Follow Up:  Does the office need to follow up with this patient?: Yes  Instructions For The Office: Appt's with Dr. Sarajane Jews are full, can she be worked in or scheduled with another provider?  RN Note:  Appt's for Dr. Sarajane Jews are full, will send note via epic for possible work in or schedule with another provider per md preference.  Symptoms  Reason For Call & Symptoms: Seen in office 04/18/13 for flu like s/s;  Neg for Flu, placed on abx for her wheezing;  Started with runny nose, congestion, fever, nauseated, coughing;  04/22/13 doesn't feel feverish, still feels very weak, nauseated,  a little runny nose but not as bad, still congested;  Notices wheezing only if she is laying down;  No sob or pain in her chest, does feel like she is breathing a little heavier.  Her biggest complaint today is the weakness and nausea;  States drinking very little, urinating 1-2 x's daily the last couple of days;  Reviewed Health History In EMR: Yes  Reviewed Medications In EMR: Yes  Reviewed Allergies In EMR: Yes  Reviewed Surgeries / Procedures: Yes  Date of Onset of Symptoms: 04/16/2013  Treatments Tried: Abx, fluids, Advil last at 0700  Treatments Tried Worked: Yes  Any Fever: Yes  Fever Taken: Oral  Fever Time Of Reading: 12:35:00  Fever Last Reading: 99.1 OB / GYN:  LMP: 04/18/2013  Guideline(s) Used:  Vomiting  Nausea  Disposition Per Guideline:   See Today in Office  Reason For Disposition Reached:   Fever present > 3 days (72 hours)  Advice Given:  Clear Liquids:  Sip water or a rehydration drink (e.g., Gatorade or Powerade).  Other options: 1/2 strength flat lemon-lime soda or ginger ale.  After 4 hours without vomiting, increase the amount.  For Non-stop Vomiting, Try Sleeping:  Try to go to sleep  (Reason: sleep often empties the stomach and relieves the need to vomit).  When you awaken, resume drinking liquids. Water works best initially.  Contagiousness:  You can return to work or school after vomiting and fever are gone.  Expected Course:  Vomiting from viral gastritis usually stops in 12 to 48 hours.  People with mild dehydration can usually treat themselves at home, by drinking more liquids.  People with moderate to severe dehydration may need medical care. signs of this include very dry mouth, dizziness, weakness, and decreased urination.  Call Back If:  Signs of dehydration occur  You become worse.  Patient Will Follow Care Advice:  YES

## 2013-04-22 NOTE — Telephone Encounter (Signed)
I sent script e-scribe and I spoke with pt. She is going to schedule for 04/23/13 to see Dr. Sarajane Jews.

## 2013-04-23 ENCOUNTER — Ambulatory Visit (INDEPENDENT_AMBULATORY_CARE_PROVIDER_SITE_OTHER): Payer: BC Managed Care – PPO | Admitting: Family Medicine

## 2013-04-23 ENCOUNTER — Encounter: Payer: Self-pay | Admitting: Family Medicine

## 2013-04-23 VITALS — BP 140/80 | HR 94 | Temp 98.3°F | Ht 61.0 in | Wt 149.0 lb

## 2013-04-23 DIAGNOSIS — J111 Influenza due to unidentified influenza virus with other respiratory manifestations: Secondary | ICD-10-CM

## 2013-04-23 MED ORDER — HYDROCODONE-HOMATROPINE 5-1.5 MG/5ML PO SYRP: 5.0000 mL | ORAL_SOLUTION | ORAL | Status: DC | PRN

## 2013-04-23 NOTE — Progress Notes (Signed)
Pre visit review using our clinic review tool, if applicable. No additional management support is needed unless otherwise documented below in the visit note. 

## 2013-04-23 NOTE — Progress Notes (Signed)
   Subjective:    Patient ID: Amy Barry, female    DOB: 1970/09/18, 43 y.o.   MRN: [id number]  HPI Here for follow up. She became ill about a week ago with body aches, fever, and sinus pressure. She was seen on 04-18-13 and was diagnosed with a sinusitis. She was given a Zpack and she finished this. Now her sinus sx are better and the fever is gone, but she is still weak, achy, and nauseated. She has a dry cough. Drinking fluids and taking Alka Seltzer Cold and Cough.    Review of Systems  Constitutional: Positive for fatigue. Negative for fever.  HENT: Positive for congestion. Negative for postnasal drip and sinus pressure.   Eyes: Negative.   Respiratory: Positive for cough.        Objective:   Physical Exam  Constitutional: She appears well-developed and well-nourished.  Ill appearing   HENT:  Right Ear: External ear normal.  Left Ear: External ear normal.  Nose: Nose normal.  Mouth/Throat: Oropharynx is clear and moist.  Eyes: Conjunctivae are normal.  Pulmonary/Chest: Effort normal and breath sounds normal.  Lymphadenopathy:    She has no cervical adenopathy.          Assessment & Plan:  This is resolving influenza. The sinusitis seems to be gone. She will rest, drink fluids. Written out of work from 04-18-13 until 04-29-13.

## 2013-06-14 ENCOUNTER — Other Ambulatory Visit: Payer: Self-pay | Admitting: Family Medicine

## 2013-10-01 ENCOUNTER — Other Ambulatory Visit (INDEPENDENT_AMBULATORY_CARE_PROVIDER_SITE_OTHER): Payer: BC Managed Care – PPO

## 2013-10-01 DIAGNOSIS — Z0000 Encounter for general adult medical examination without abnormal findings: Secondary | ICD-10-CM

## 2013-10-01 LAB — POCT URINALYSIS DIPSTICK
BILIRUBIN UA: NEGATIVE
Glucose, UA: NEGATIVE
Leukocytes, UA: NEGATIVE
Nitrite, UA: NEGATIVE
PH UA: 5.5
Urobilinogen, UA: 0.2

## 2013-10-01 LAB — LIPID PANEL
CHOL/HDL RATIO: 3
Cholesterol: 180 mg/dL (ref 0–200)
HDL: 69.9 mg/dL (ref >39.00)
LDL CALC: 99 mg/dL (ref 0–99)
NONHDL: 110.1
Triglycerides: 57 mg/dL (ref 0.0–149.0)
VLDL: 11.4 mg/dL (ref 0.0–40.0)

## 2013-10-01 LAB — HEPATIC FUNCTION PANEL
ALT: 25 U/L (ref 0–35)
AST: 23 U/L (ref 0–37)
Albumin: 4 g/dL (ref 3.5–5.2)
Alkaline Phosphatase: 45 U/L (ref 39–117)
Bilirubin, Direct: 0 mg/dL (ref 0.0–0.3)
Total Bilirubin: 0.6 mg/dL (ref 0.2–1.2)
Total Protein: 7.7 g/dL (ref 6.0–8.3)

## 2013-10-01 LAB — BASIC METABOLIC PANEL
BUN: 11 mg/dL (ref 6–23)
CO2: 27 mEq/L (ref 19–32)
Calcium: 9.2 mg/dL (ref 8.4–10.5)
Chloride: 104 mEq/L (ref 96–112)
Creatinine, Ser: 0.6 mg/dL (ref 0.4–1.2)
GFR: 135.22 mL/min (ref >60.00)
Glucose, Bld: 88 mg/dL (ref 70–99)
POTASSIUM: 3.9 meq/L (ref 3.5–5.1)
Sodium: 138 mEq/L (ref 135–145)

## 2013-10-01 LAB — CBC WITH DIFFERENTIAL/PLATELET
BASOS PCT: 0.3 % (ref 0.0–3.0)
Basophils Absolute: 0 10*3/uL (ref 0.0–0.1)
EOS PCT: 0.3 % (ref 0.0–5.0)
Eosinophils Absolute: 0 10*3/uL (ref 0.0–0.7)
HEMATOCRIT: 34.7 % — AB (ref 36.0–46.0)
Hemoglobin: 11.5 g/dL — ABNORMAL LOW (ref 12.0–15.0)
LYMPHS ABS: 1.5 10*3/uL (ref 0.7–4.0)
Lymphocytes Relative: 26.8 % (ref 12.0–46.0)
MCHC: 33.2 g/dL (ref 30.0–36.0)
MCV: 92.9 fl (ref 78.0–100.0)
MONOS PCT: 8.3 % (ref 3.0–12.0)
Monocytes Absolute: 0.5 10*3/uL (ref 0.1–1.0)
NEUTROS PCT: 64.3 % (ref 43.0–77.0)
Neutro Abs: 3.7 10*3/uL (ref 1.4–7.7)
PLATELETS: 219 10*3/uL (ref 150.0–400.0)
RBC: 3.73 Mil/uL — AB (ref 3.87–5.11)
RDW: 13.8 % (ref 11.5–15.5)
WBC: 5.7 10*3/uL (ref 4.0–10.5)

## 2013-10-01 LAB — TSH: TSH: 0.47 u[IU]/mL (ref 0.35–4.50)

## 2013-10-09 ENCOUNTER — Encounter: Payer: Self-pay | Admitting: Family Medicine

## 2013-10-09 ENCOUNTER — Ambulatory Visit (INDEPENDENT_AMBULATORY_CARE_PROVIDER_SITE_OTHER): Payer: BC Managed Care – PPO | Admitting: Family Medicine

## 2013-10-09 VITALS — BP 131/87 | HR 82 | Temp 99.2°F | Ht 61.0 in | Wt 154.0 lb

## 2013-10-09 DIAGNOSIS — Z0000 Encounter for general adult medical examination without abnormal findings: Secondary | ICD-10-CM

## 2013-10-09 MED ORDER — POTASSIUM CHLORIDE CRYS ER 10 MEQ PO TBCR: EXTENDED_RELEASE_TABLET | ORAL | Status: DC

## 2013-10-09 MED ORDER — METOPROLOL TARTRATE 100 MG PO TABS: 100.0000 mg | ORAL_TABLET | Freq: Two times a day (BID) | ORAL | Status: DC

## 2013-10-09 MED ORDER — AMLODIPINE BESYLATE 10 MG PO TABS: 10.0000 mg | ORAL_TABLET | Freq: Every morning | ORAL | Status: DC

## 2013-10-09 MED ORDER — IBUPROFEN 800 MG PO TABS: 800.0000 mg | ORAL_TABLET | Freq: Three times a day (TID) | ORAL | Status: DC | PRN

## 2013-10-09 MED ORDER — FUROSEMIDE 20 MG PO TABS: 20.0000 mg | ORAL_TABLET | Freq: Every day | ORAL | Status: DC

## 2013-10-09 MED ORDER — OMEPRAZOLE 40 MG PO CPDR: 40.0000 mg | DELAYED_RELEASE_CAPSULE | Freq: Every day | ORAL | Status: DC | PRN

## 2013-10-09 NOTE — Progress Notes (Signed)
   Subjective:    Patient ID: Amy Barry, female    DOB: [birth year], 43 y.o.   MRN: [id number]  HPI 43 yr old female for a cpx. She feels well.    Review of Systems  Constitutional: Negative.   HENT: Negative.   Eyes: Negative.   Respiratory: Negative.   Cardiovascular: Negative.   Gastrointestinal: Negative.   Genitourinary: Negative for dysuria, urgency, frequency, hematuria, flank pain, decreased urine volume, enuresis, difficulty urinating, pelvic pain and dyspareunia.  Musculoskeletal: Negative.   Skin: Negative.   Neurological: Negative.   Psychiatric/Behavioral: Negative.        Objective:   Physical Exam  Constitutional: She appears well-developed and well-nourished. No distress.  HENT:  Head: Normocephalic and atraumatic.  Right Ear: External ear normal.  Left Ear: External ear normal.  Nose: Nose normal.  Mouth/Throat: Oropharynx is clear and moist. No oropharyngeal exudate.  Eyes: Conjunctivae and EOM are normal. Pupils are equal, round, and reactive to light. Right eye exhibits no discharge. Left eye exhibits no discharge. No scleral icterus.  Neck: Normal range of motion. Neck supple. No JVD present. No thyromegaly present.  Cardiovascular: Normal rate, regular rhythm, normal heart sounds and intact distal pulses.  Exam reveals no gallop and no friction rub.   No murmur heard. Pulmonary/Chest: Effort normal and breath sounds normal. No stridor. No respiratory distress. She has no wheezes. She has no rales. She exhibits no tenderness.  Abdominal: Soft. Normal appearance and bowel sounds are normal. She exhibits no distension, no abdominal bruit, no ascites and no mass. There is no hepatosplenomegaly. There is no tenderness. There is no rigidity, no rebound and no guarding. No hernia.  Genitourinary: Rectum normal, vagina normal and uterus normal. No breast swelling, tenderness, discharge or bleeding. Cervix exhibits no motion tenderness, no discharge and no  friability. Right adnexum displays no mass, no tenderness and no fullness. Left adnexum displays no mass, no tenderness and no fullness. No erythema, tenderness or bleeding around the vagina. No vaginal discharge found.  Musculoskeletal: Normal range of motion. She exhibits no edema and no tenderness.  Lymphadenopathy:    She has no cervical adenopathy.  Neurological: She is alert. She has normal reflexes. No cranial nerve deficit. She exhibits normal muscle tone. Coordination normal.  Skin: Skin is warm and dry. No rash noted. She is not diaphoretic. No erythema. No pallor.  Psychiatric: She has a normal mood and affect. Her behavior is normal. Judgment and thought content normal.          Assessment & Plan:  Well exam.

## 2013-10-09 NOTE — Progress Notes (Signed)
Pre visit review using our clinic review tool, if applicable. No additional management support is needed unless otherwise documented below in the visit note. 

## 2013-11-05 ENCOUNTER — Encounter: Payer: Self-pay | Admitting: Family Medicine

## 2013-11-05 ENCOUNTER — Ambulatory Visit (INDEPENDENT_AMBULATORY_CARE_PROVIDER_SITE_OTHER): Payer: BC Managed Care – PPO | Admitting: Family Medicine

## 2013-11-05 VITALS — BP 142/74 | HR 84 | Temp 98.8°F | Ht 61.0 in | Wt 153.0 lb

## 2013-11-05 DIAGNOSIS — M545 Low back pain, unspecified: Secondary | ICD-10-CM

## 2013-11-05 LAB — POCT URINALYSIS DIPSTICK
BILIRUBIN UA: NEGATIVE
Glucose, UA: NEGATIVE
Ketones, UA: NEGATIVE
Leukocytes, UA: NEGATIVE
NITRITE UA: NEGATIVE
PH UA: 6
Spec Grav, UA: 1.025
UROBILINOGEN UA: 0.2

## 2013-11-05 MED ORDER — DICLOFENAC SODIUM 75 MG PO TBEC: 75.0000 mg | DELAYED_RELEASE_TABLET | Freq: Two times a day (BID) | ORAL | Status: DC

## 2013-11-05 MED ORDER — CYCLOBENZAPRINE HCL 10 MG PO TABS: 10.0000 mg | ORAL_TABLET | Freq: Three times a day (TID) | ORAL | Status: DC | PRN

## 2013-11-05 NOTE — Progress Notes (Signed)
Pre visit review using our clinic review tool, if applicable. No additional management support is needed unless otherwise documented below in the visit note. 

## 2013-11-05 NOTE — Addendum Note (Signed)
Addended by: Aggie Hacker A on: 11/05/2013 12:26 PM   Modules accepted: Orders

## 2013-11-05 NOTE — Progress Notes (Signed)
   Subjective:    Patient ID: Amy Barry, female    DOB: 08/03/1970, 43 y.o.   MRN: [id number]  HPI Here for 4 days of stiffness and pain in the lower back, and the pain radiates down the backs of both thighs. No recent trauma. Heat and Ibuprofen help a little.    Review of Systems  Constitutional: Negative.   Gastrointestinal: Negative.   Genitourinary: Negative.   Musculoskeletal: Positive for back pain.       Objective:   Physical Exam  Constitutional:  In pain, walks slowly   Musculoskeletal:  Tender over the lower back with spasm and reduced ROM           Assessment & Plan:  Rest, heat. Add Diclofenac and Flexeril prn. Recheck prn

## 2014-01-21 ENCOUNTER — Encounter: Payer: Self-pay | Admitting: Family Medicine

## 2014-01-21 ENCOUNTER — Ambulatory Visit (INDEPENDENT_AMBULATORY_CARE_PROVIDER_SITE_OTHER): Payer: 59 | Admitting: Family Medicine

## 2014-01-21 VITALS — BP 127/74 | HR 87 | Temp 99.1°F | Ht 61.0 in | Wt 156.0 lb

## 2014-01-21 DIAGNOSIS — J0100 Acute maxillary sinusitis, unspecified: Secondary | ICD-10-CM

## 2014-01-21 MED ORDER — AZITHROMYCIN 250 MG PO TABS: ORAL_TABLET | ORAL | Status: DC

## 2014-01-21 MED ORDER — PROMETHAZINE HCL 25 MG PO TABS: 25.0000 mg | ORAL_TABLET | ORAL | Status: DC | PRN

## 2014-01-21 NOTE — Progress Notes (Signed)
   Subjective:    Patient ID: Amy Barry, female    DOB: 1970/04/14, 43 y.o.   MRN: [id number]  HPI Here for 3 days of fever, aches, HA, sinus pressure, and nausea. Slight cough only. Drinking fluids   Review of Systems  Constitutional: Positive for fever.  HENT: Positive for congestion, postnasal drip and sinus pressure.   Eyes: Negative.   Respiratory: Positive for cough.        Objective:   Physical Exam  Constitutional: She appears well-developed and well-nourished.  HENT:  Right Ear: External ear normal.  Left Ear: External ear normal.  Nose: Nose normal.  Mouth/Throat: Oropharynx is clear and moist.  Eyes: Conjunctivae are normal.  Pulmonary/Chest: Effort normal and breath sounds normal.  Lymphadenopathy:    She has no cervical adenopathy.          Assessment & Plan:  Written out of work for yesterday and today.

## 2014-01-21 NOTE — Progress Notes (Signed)
Pre visit review using our clinic review tool, if applicable. No additional management support is needed unless otherwise documented below in the visit note. 

## 2014-07-30 ENCOUNTER — Ambulatory Visit (INDEPENDENT_AMBULATORY_CARE_PROVIDER_SITE_OTHER): Payer: 59 | Admitting: Family Medicine

## 2014-07-30 ENCOUNTER — Encounter: Payer: Self-pay | Admitting: Family Medicine

## 2014-07-30 VITALS — BP 159/88 | HR 84 | Temp 98.6°F | Ht 61.0 in | Wt 158.0 lb

## 2014-07-30 DIAGNOSIS — M609 Myositis, unspecified: Secondary | ICD-10-CM

## 2014-07-30 DIAGNOSIS — M791 Myalgia: Secondary | ICD-10-CM | POA: Diagnosis not present

## 2014-07-30 DIAGNOSIS — IMO0001 Reserved for inherently not codable concepts without codable children: Secondary | ICD-10-CM

## 2014-07-30 DIAGNOSIS — I10 Essential (primary) hypertension: Secondary | ICD-10-CM

## 2014-07-30 LAB — HEPATIC FUNCTION PANEL
ALT: 10 U/L (ref 0–35)
AST: 47 U/L — ABNORMAL HIGH (ref 0–37)
Albumin: 4 g/dL (ref 3.5–5.2)
Alkaline Phosphatase: 53 U/L (ref 39–117)
BILIRUBIN TOTAL: 0.3 mg/dL (ref 0.2–1.2)
Bilirubin, Direct: 0.1 mg/dL (ref 0.0–0.3)
Total Protein: 7.3 g/dL (ref 6.0–8.3)

## 2014-07-30 LAB — BASIC METABOLIC PANEL
BUN: 10 mg/dL (ref 6–23)
CHLORIDE: 106 meq/L (ref 96–112)
CO2: 28 mEq/L (ref 19–32)
CREATININE: 1.12 mg/dL (ref 0.40–1.20)
Calcium: 9.3 mg/dL (ref 8.4–10.5)
GFR: 68.07 mL/min (ref >60.00)
GLUCOSE: 91 mg/dL (ref 70–99)
POTASSIUM: 3.4 meq/L — AB (ref 3.5–5.1)
Sodium: 139 mEq/L (ref 135–145)

## 2014-07-30 LAB — CBC WITH DIFFERENTIAL/PLATELET
Basophils Absolute: 0 10*3/uL (ref 0.0–0.1)
Basophils Relative: 0.3 % (ref 0.0–3.0)
Eosinophils Absolute: 0 10*3/uL (ref 0.0–0.7)
Eosinophils Relative: 0.4 % (ref 0.0–5.0)
HCT: 33.7 % — ABNORMAL LOW (ref 36.0–46.0)
HEMOGLOBIN: 11.3 g/dL — AB (ref 12.0–15.0)
LYMPHS PCT: 32.4 % (ref 12.0–46.0)
Lymphs Abs: 1.6 10*3/uL (ref 0.7–4.0)
MCHC: 33.6 g/dL (ref 30.0–36.0)
MCV: 88.2 fl (ref 78.0–100.0)
Monocytes Absolute: 0.4 10*3/uL (ref 0.1–1.0)
Monocytes Relative: 7.7 % (ref 3.0–12.0)
NEUTROS ABS: 2.9 10*3/uL (ref 1.4–7.7)
Neutrophils Relative %: 59.2 % (ref 43.0–77.0)
Platelets: 248 10*3/uL (ref 150.0–400.0)
RBC: 3.82 Mil/uL — ABNORMAL LOW (ref 3.87–5.11)
RDW: 14.1 % (ref 11.5–15.5)
WBC: 4.8 10*3/uL (ref 4.0–10.5)

## 2014-07-30 LAB — CK: Total CK: 119 U/L (ref 7–177)

## 2014-07-30 LAB — TSH: TSH: 1.75 u[IU]/mL (ref 0.35–4.50)

## 2014-07-30 LAB — SEDIMENTATION RATE: Sed Rate: 54 mm/hr — ABNORMAL HIGH (ref 0–22)

## 2014-07-30 MED ORDER — FUROSEMIDE 40 MG PO TABS: 40.0000 mg | ORAL_TABLET | Freq: Every day | ORAL | Status: DC

## 2014-07-30 NOTE — Progress Notes (Signed)
   Subjective:    Patient ID: Amy Barry, female    DOB: Jan 13, 1971, 44 y.o.   MRN: [id number]  HPI Here for several things. First about one month ago she developed some swelling over the body, including the hands and feet. Her muscles have started aching and sometimes they can be fairly painful. She has felt very tired and she gets SOB at times. Her BP has gone up the 774F or 423T systolic.    Review of Systems  Constitutional: Positive for fatigue.  Respiratory: Positive for shortness of breath. Negative for cough and wheezing.   Cardiovascular: Positive for leg swelling. Negative for chest pain and palpitations.  Gastrointestinal: Negative.   Endocrine: Negative.   Musculoskeletal: Positive for myalgias. Negative for joint swelling and arthralgias.  Neurological: Negative.        Objective:   Physical Exam  Constitutional: She is oriented to person, place, and time. She appears well-developed and well-nourished.  Neck: No thyromegaly present.  Cardiovascular: Normal rate, regular rhythm, normal heart sounds and intact distal pulses.   Pulmonary/Chest: Effort normal and breath sounds normal.  Musculoskeletal: Normal range of motion. She exhibits no tenderness.  1+ edema in the feet   Lymphadenopathy:    She has no cervical adenopathy.  Neurological: She is alert and oriented to person, place, and time.          Assessment & Plan:  She has some increased edema so we will increase the Lasix to 40 mg daily. Hopefully this will lower the BP a bit. Get labs today to investigate all this.

## 2014-07-30 NOTE — Progress Notes (Signed)
Pre visit review using our clinic review tool, if applicable. No additional management support is needed unless otherwise documented below in the visit note. 

## 2014-07-31 NOTE — Addendum Note (Signed)
Addended by: Aggie Hacker A on: 07/31/2014 01:27 PM   Modules accepted: Medications

## 2014-12-29 ENCOUNTER — Ambulatory Visit (INDEPENDENT_AMBULATORY_CARE_PROVIDER_SITE_OTHER): Payer: PRIVATE HEALTH INSURANCE | Admitting: Family Medicine

## 2014-12-29 ENCOUNTER — Encounter: Payer: Self-pay | Admitting: Family Medicine

## 2014-12-29 ENCOUNTER — Other Ambulatory Visit: Payer: Self-pay | Admitting: Family Medicine

## 2014-12-29 VITALS — BP 164/92 | HR 88 | Temp 98.7°F | Ht 61.0 in | Wt 155.0 lb

## 2014-12-29 DIAGNOSIS — M609 Myositis, unspecified: Secondary | ICD-10-CM

## 2014-12-29 DIAGNOSIS — M791 Myalgia: Secondary | ICD-10-CM

## 2014-12-29 DIAGNOSIS — IMO0001 Reserved for inherently not codable concepts without codable children: Secondary | ICD-10-CM

## 2014-12-29 LAB — CBC WITH DIFFERENTIAL/PLATELET
BASOS PCT: 0.3 % (ref 0.0–3.0)
Basophils Absolute: 0 10*3/uL (ref 0.0–0.1)
EOS PCT: 0.4 % (ref 0.0–5.0)
Eosinophils Absolute: 0 10*3/uL (ref 0.0–0.7)
HCT: 35.8 % — ABNORMAL LOW (ref 36.0–46.0)
Hemoglobin: 11.7 g/dL — ABNORMAL LOW (ref 12.0–15.0)
LYMPHS ABS: 1.7 10*3/uL (ref 0.7–4.0)
Lymphocytes Relative: 26.9 % (ref 12.0–46.0)
MCHC: 32.6 g/dL (ref 30.0–36.0)
MCV: 89.6 fl (ref 78.0–100.0)
MONOS PCT: 9 % (ref 3.0–12.0)
Monocytes Absolute: 0.6 10*3/uL (ref 0.1–1.0)
NEUTROS ABS: 4 10*3/uL (ref 1.4–7.7)
NEUTROS PCT: 63.4 % (ref 43.0–77.0)
PLATELETS: 241 10*3/uL (ref 150.0–400.0)
RBC: 3.99 Mil/uL (ref 3.87–5.11)
RDW: 15.6 % — AB (ref 11.5–15.5)
WBC: 6.3 10*3/uL (ref 4.0–10.5)

## 2014-12-29 LAB — BASIC METABOLIC PANEL
BUN: 12 mg/dL (ref 6–23)
CALCIUM: 9.3 mg/dL (ref 8.4–10.5)
CO2: 27 mEq/L (ref 19–32)
CREATININE: 0.64 mg/dL (ref 0.40–1.20)
Chloride: 104 mEq/L (ref 96–112)
GFR: 129.6 mL/min (ref >60.00)
Glucose, Bld: 86 mg/dL (ref 70–99)
Potassium: 4.5 mEq/L (ref 3.5–5.1)
Sodium: 140 mEq/L (ref 135–145)

## 2014-12-29 LAB — SEDIMENTATION RATE: SED RATE: 69 mm/h — AB (ref 0–22)

## 2014-12-29 MED ORDER — PREDNISONE 10 MG PO TABS: ORAL_TABLET | ORAL | Status: DC

## 2014-12-29 MED ORDER — AZITHROMYCIN 250 MG PO TABS: ORAL_TABLET | ORAL | Status: DC

## 2014-12-29 NOTE — Progress Notes (Signed)
   Subjective:    Patient ID: Amy Barry, female    DOB: 11-24-1970, 44 y.o.   MRN: [id number]  HPI Here for continued symptoms of stiffness and pain over her body, sometimes involving joints and sometimes muscles. She has good days and bad days. Sometimes her feet swell but generally her joints do not show much swelling. No fevers. She was here for this in April and her ESR that day was elevated to 54. No other lab abnormalities were seen. She has tried Ibuprofen and Diclofenac with no real improvement.    Review of Systems  Constitutional: Negative.   Respiratory: Negative.   Cardiovascular: Negative.  Negative for chest pain and palpitations.  Musculoskeletal: Positive for myalgias, back pain, arthralgias and neck pain. Negative for joint swelling.  Skin: Negative.        Objective:   Physical Exam  Constitutional: She appears well-developed and well-nourished.  Neck: No thyromegaly present.  Cardiovascular: Normal rate, regular rhythm, normal heart sounds and intact distal pulses.   Pulmonary/Chest: Effort normal and breath sounds normal.  Musculoskeletal: Normal range of motion. She exhibits no edema or tenderness.  Lymphadenopathy:    She has no cervical adenopathy.          Assessment & Plan:  Myalgias of uncertain etiology. Try a prednisone taper. Refer to Rheumatology. Recheck labs today

## 2014-12-29 NOTE — Progress Notes (Signed)
Pre visit review using our clinic review tool, if applicable. No additional management support is needed unless otherwise documented below in the visit note. 

## 2014-12-31 NOTE — Telephone Encounter (Signed)
Can you clarify potassium script, once or twice a day?

## 2015-04-08 ENCOUNTER — Telehealth: Payer: Self-pay | Admitting: Family Medicine

## 2015-04-08 NOTE — Telephone Encounter (Signed)
Pt has been sch

## 2015-04-08 NOTE — Telephone Encounter (Signed)
Pt has uri and would like appt tomorrow. Can I use sda slot?

## 2015-04-08 NOTE — Telephone Encounter (Signed)
Per Dr. Sarajane Jews -  Same day appt for tomorrow would be fine. Thanks!

## 2015-04-08 NOTE — Telephone Encounter (Signed)
lmom for pt to call back

## 2015-04-09 ENCOUNTER — Encounter: Payer: Self-pay | Admitting: Family Medicine

## 2015-04-09 ENCOUNTER — Ambulatory Visit (INDEPENDENT_AMBULATORY_CARE_PROVIDER_SITE_OTHER)
Admission: RE | Admit: 2015-04-09 | Discharge: 2015-04-09 | Disposition: A | Discharge status: Home / Self Care | Payer: 59 | Source: Ambulatory Visit | Attending: Family Medicine | Admitting: Family Medicine

## 2015-04-09 ENCOUNTER — Ambulatory Visit (INDEPENDENT_AMBULATORY_CARE_PROVIDER_SITE_OTHER): Payer: 59 | Admitting: Family Medicine

## 2015-04-09 VITALS — BP 159/105 | HR 112 | Temp 99.0°F | Ht 61.0 in | Wt 160.0 lb

## 2015-04-09 DIAGNOSIS — J189 Pneumonia, unspecified organism: Secondary | ICD-10-CM

## 2015-04-09 MED ORDER — AMOXICILLIN-POT CLAVULANATE 875-125 MG PO TABS: 1.0000 | ORAL_TABLET | Freq: Two times a day (BID) | ORAL | Status: DC

## 2015-04-09 MED ORDER — CEFTRIAXONE SODIUM 1 G IJ SOLR
1.0000 g | Freq: Once | INTRAMUSCULAR | Status: AC
  Administered 2015-04-09: 1 g via INTRAMUSCULAR

## 2015-04-09 MED ORDER — HYDROCODONE-HOMATROPINE 5-1.5 MG/5ML PO SYRP: 5.0000 mL | ORAL_SOLUTION | ORAL | Status: DC | PRN

## 2015-04-09 NOTE — Addendum Note (Signed)
Addended by: Colleen Can on: 04/09/2015 11:34 AM   Modules accepted: Orders

## 2015-04-09 NOTE — Progress Notes (Signed)
   Subjective:    Patient ID: Amy Barry, female    DOB: [birth year], 45 y.o.   MRN: [id number]  HPI Here for one week of URI symptoms that began with stuffy head and PND. Then 3 days ago she developed chest congestion and a cough producing yellow sputum. She has had fevers to 101 degrees. Then last night she developed SOB and sharp pleuritic pains in the left side of her chest. No NVD.    Review of Systems  Constitutional: Positive for fever.  HENT: Positive for congestion and postnasal drip.   Eyes: Negative.   Respiratory: Positive for cough, chest tightness and shortness of breath. Negative for wheezing.   Cardiovascular: Positive for chest pain. Negative for palpitations and leg swelling.       Objective:   Physical Exam  Constitutional:  Ill appearing, taking shallow rapid breaths   HENT:  Right Ear: External ear normal.  Left Ear: External ear normal.  Nose: Nose normal.  Mouth/Throat: Oropharynx is clear and moist.  Neck: No thyromegaly present.  Cardiovascular: Normal rate, regular rhythm, normal heart sounds and intact distal pulses.   Pulmonary/Chest: She has no wheezes.  The right lung is clear. The left middle and lower lobes have loud rales and reduced air flow  Lymphadenopathy:    She has no cervical adenopathy.          Assessment & Plan:  Pneumonia. Given a shot of Rocephin to be followed by oral Augmentin. Drink fluids. Get a CXR today.

## 2015-04-09 NOTE — Progress Notes (Signed)
Pre visit review using our clinic review tool, if applicable. No additional management support is needed unless otherwise documented below in the visit note. 

## 2015-04-17 ENCOUNTER — Encounter: Payer: Self-pay | Admitting: Family Medicine

## 2015-04-17 ENCOUNTER — Ambulatory Visit (INDEPENDENT_AMBULATORY_CARE_PROVIDER_SITE_OTHER): Payer: 59 | Admitting: Family Medicine

## 2015-04-17 VITALS — BP 131/83 | HR 82 | Temp 99.1°F | Ht 61.0 in | Wt 162.0 lb

## 2015-04-17 DIAGNOSIS — J189 Pneumonia, unspecified organism: Secondary | ICD-10-CM

## 2015-04-17 NOTE — Progress Notes (Signed)
   Subjective:    Patient ID: Amy Barry, female    DOB: [birth year], 45 y.o.   MRN: [id number]  HPI Here to recheck a left sided pneumonia. She was seen here one week ago with a pleuritic cough and rales on the left side on exam. She was diagnosed with a pneumonia, though her CXR that day was clear. She was given a shot of Rocephin and was started on Augmentin. She now feels better, she is no longer SOB, and the pleuritic cough went away. She still has a tiny fever and she is fatigued. She went back to work 2 days this week but then stayed home after that. Her intake of food and fluids has picked back up to normal.    Review of Systems  Constitutional: Positive for fever and fatigue. Negative for chills and diaphoresis.  HENT: Negative.   Eyes: Negative.   Respiratory: Positive for cough. Negative for shortness of breath and wheezing.   Cardiovascular: Negative.        Objective:   Physical Exam  Constitutional: She appears well-developed and well-nourished.  HENT:  Right Ear: External ear normal.  Left Ear: External ear normal.  Nose: Nose normal.  Mouth/Throat: Oropharynx is clear and moist.  Eyes: Conjunctivae are normal.  Neck: No thyromegaly present.  Cardiovascular: Normal rate, regular rhythm, normal heart sounds and intact distal pulses.   Pulmonary/Chest: Effort normal and breath sounds normal. No respiratory distress. She has no wheezes. She has no rales.  Lymphadenopathy:    She has no cervical adenopathy.          Assessment & Plan:  The pneumonia seems to be resolving, though a minimal fever is present. She will rest and finsh out the antibiotics this weekend. She was written out of work 04-15-15 through today. Recheck prn

## 2015-04-17 NOTE — Progress Notes (Signed)
Pre visit review using our clinic review tool, if applicable. No additional management support is needed unless otherwise documented below in the visit note. 

## 2015-08-21 ENCOUNTER — Encounter: Payer: Self-pay | Admitting: Family Medicine

## 2015-08-21 ENCOUNTER — Ambulatory Visit (INDEPENDENT_AMBULATORY_CARE_PROVIDER_SITE_OTHER): Payer: 59 | Admitting: Family Medicine

## 2015-08-21 VITALS — BP 138/92 | HR 90 | Temp 98.6°F | Resp 18 | Ht 61.0 in | Wt 163.0 lb

## 2015-08-21 DIAGNOSIS — M79622 Pain in left upper arm: Secondary | ICD-10-CM

## 2015-08-21 DIAGNOSIS — M25522 Pain in left elbow: Secondary | ICD-10-CM

## 2015-08-21 MED ORDER — PREDNISONE 10 MG PO TABS: ORAL_TABLET | ORAL | Status: DC

## 2015-08-21 MED ORDER — FUROSEMIDE 40 MG PO TABS: 40.0000 mg | ORAL_TABLET | Freq: Every day | ORAL | Status: DC

## 2015-08-21 MED ORDER — VALACYCLOVIR HCL 1 G PO TABS: 1000.0000 mg | ORAL_TABLET | Freq: Three times a day (TID) | ORAL | Status: DC

## 2015-08-21 NOTE — Progress Notes (Signed)
   Subjective:    Patient ID: [name], female    DOB: Aug 30, 1970, 45 y.o.   MRN: [id number]  HPI Here for 4 days of an intense burning pain in the left neck, left shoulder and upper arm, and left upper chest. No recent trauma. No rashes. Ibuprofen and Icy Hot do not help. No neck stiffness or pain.    Review of Systems  Constitutional: Negative.   HENT: Negative.   Respiratory: Negative.   Cardiovascular: Negative.   Skin: Negative.        Objective:   Physical Exam  Constitutional: She is oriented to person, place, and time. She appears well-developed and well-nourished.  Cardiovascular: Normal rate, regular rhythm, normal heart sounds and intact distal pulses.   Pulmonary/Chest: Effort normal and breath sounds normal.  Musculoskeletal:  Her neck is not tender ans has full ROM   Neurological: She is alert and oriented to person, place, and time.  Skin: No rash noted. No erythema.          Assessment & Plan:  Possible early shingles. Start on a Prednisone taper. I gave her a rx for Valtrex which she will fill only if a rash appears. She will look for patches of red blisters.                               Laurey Morale, MD

## 2015-08-24 ENCOUNTER — Telehealth: Payer: Self-pay | Admitting: Family Medicine

## 2015-08-24 MED ORDER — TRAMADOL HCL 50 MG PO TABS: 100.0000 mg | ORAL_TABLET | Freq: Four times a day (QID) | ORAL | Status: DC | PRN

## 2015-08-24 NOTE — Telephone Encounter (Signed)
Pt calling back to follow up on her possible shingles dx on Friday. Prednisone is not helping.  Pt has not started valtrex. No blisters have started. Pt still has burning in arm, and pt is really uncomfortable. Pt stayed home from work. Pt has tried to stay still. Nothing for pain has worked.  [facility] [place] [employer]

## 2015-08-24 NOTE — Telephone Encounter (Signed)
Call in Tramadol 50 mg to take 2 tabs every 6 hours prn pain, #60 with one rf 

## 2015-08-24 NOTE — Telephone Encounter (Signed)
Pt would like to know if she should start the valtrex??

## 2015-08-24 NOTE — Telephone Encounter (Signed)
Per Dr. Sarajane Jews, yes pt should start on the Valrex. I spoke with pt and called in script.

## 2015-09-04 ENCOUNTER — Ambulatory Visit (INDEPENDENT_AMBULATORY_CARE_PROVIDER_SITE_OTHER): Payer: 59 | Admitting: Family Medicine

## 2015-09-04 ENCOUNTER — Encounter: Payer: Self-pay | Admitting: Family Medicine

## 2015-09-04 VITALS — BP 146/90 | HR 82 | Temp 98.2°F | Ht 61.0 in | Wt 168.2 lb

## 2015-09-04 DIAGNOSIS — M542 Cervicalgia: Secondary | ICD-10-CM | POA: Diagnosis not present

## 2015-09-04 MED ORDER — OXYCODONE-ACETAMINOPHEN 10-325 MG PO TABS: 1.0000 | ORAL_TABLET | Freq: Four times a day (QID) | ORAL | Status: DC | PRN

## 2015-09-04 NOTE — Progress Notes (Signed)
   Subjective:    Patient ID: [name], female    DOB: Aug 04, 1970, 45 y.o.   MRN: [id number]  HPI Here for worsening burning severe pains in the left neck and left chest and left upper arm and left shoulder. She was here on 08-21-15 and we felt this could have represented early shingles. She took a course of Prednisone and of Valtrex with no benefit at all. She have never seen a rash in this area. She tried Tramadol also with no relief. Since then the pain has worsened and it is constant. It shoots all the way down the left arm now and the arm and hand tingle and go numb at times.    Review of Systems  Constitutional: Negative.   Respiratory: Negative.   Cardiovascular: Negative.   Musculoskeletal: Positive for neck pain.  Skin: Negative for rash.  Neurological: Positive for numbness. Negative for weakness.       Objective:   Physical Exam  Constitutional:  In a lot of pain, she feels better splinting the left arm up against her chest  Cardiovascular: Normal rate, regular rhythm, normal heart sounds and intact distal pulses.   Pulmonary/Chest: Effort normal and breath sounds normal.  Musculoskeletal:  She is very tender over the C5 to C7 vertebrae and just to the left of these areas. There is spasm in there left upper back and upper trapezius. ROM of  the neck is limited by pain  Skin: No rash noted.          Assessment & Plan:  Neck pain which now seems to be from a pinched cervical nerve. I advised her to get a sling at her pharmacy and to support the left arm in the sling. She is off work this weekend. Given Percocet 10-325 for pain relief. We will set up a cervical spine MRI next week.  Laurey Morale, MD

## 2015-09-04 NOTE — Progress Notes (Signed)
Pre visit review using our clinic review tool, if applicable. No additional management support is needed unless otherwise documented below in the visit note. 

## 2015-09-12 ENCOUNTER — Ambulatory Visit
Admission: RE | Admit: 2015-09-12 | Discharge: 2015-09-12 | Disposition: A | Discharge status: Home / Self Care | Payer: 59 | Source: Ambulatory Visit | Attending: Family Medicine | Admitting: Family Medicine

## 2015-09-12 DIAGNOSIS — M542 Cervicalgia: Secondary | ICD-10-CM

## 2015-09-14 ENCOUNTER — Telehealth: Payer: Self-pay | Admitting: Family Medicine

## 2015-09-14 NOTE — Telephone Encounter (Signed)
Pt would like mri results

## 2015-09-15 NOTE — Telephone Encounter (Signed)
Tell her the MRI showed a significant herniated disc at the lower level of her cervical spine which is putting pressure on the nerve. I will refer her to Neurosurgery ASAP.

## 2015-09-16 ENCOUNTER — Emergency Department (HOSPITAL_BASED_OUTPATIENT_CLINIC_OR_DEPARTMENT_OTHER)
Admission: EM | Admit: 2015-09-16 | Discharge: 2015-09-17 | Disposition: A | Discharge status: Home / Self Care | Payer: 59 | Attending: Emergency Medicine | Admitting: Emergency Medicine

## 2015-09-16 ENCOUNTER — Encounter (HOSPITAL_BASED_OUTPATIENT_CLINIC_OR_DEPARTMENT_OTHER): Payer: Self-pay

## 2015-09-16 ENCOUNTER — Emergency Department (HOSPITAL_BASED_OUTPATIENT_CLINIC_OR_DEPARTMENT_OTHER): Payer: 59

## 2015-09-16 DIAGNOSIS — Z79899 Other long term (current) drug therapy: Secondary | ICD-10-CM | POA: Diagnosis not present

## 2015-09-16 DIAGNOSIS — I10 Essential (primary) hypertension: Secondary | ICD-10-CM | POA: Insufficient documentation

## 2015-09-16 DIAGNOSIS — M5412 Radiculopathy, cervical region: Secondary | ICD-10-CM

## 2015-09-16 DIAGNOSIS — J45909 Unspecified asthma, uncomplicated: Secondary | ICD-10-CM | POA: Diagnosis not present

## 2015-09-16 DIAGNOSIS — R079 Chest pain, unspecified: Secondary | ICD-10-CM | POA: Diagnosis present

## 2015-09-16 LAB — TROPONIN I: Troponin I: <0.03 ng/mL (ref <0.031)

## 2015-09-16 MED ORDER — GABAPENTIN 600 MG PO TABS
300.0000 mg | ORAL_TABLET | Freq: Once | ORAL | Status: AC
  Administered 2015-09-16: 300 mg via ORAL
  Filled 2015-09-16: qty 1

## 2015-09-16 MED ORDER — CYCLOBENZAPRINE HCL 5 MG PO TABS
5.0000 mg | ORAL_TABLET | Freq: Once | ORAL | Status: AC
  Administered 2015-09-16: 5 mg via ORAL
  Filled 2015-09-16: qty 1

## 2015-09-16 NOTE — Telephone Encounter (Signed)
Spoke with patient about results and routed her message to Dr. Sarajane Jews.

## 2015-09-16 NOTE — ED Notes (Signed)
IV attempt x1. Blood collected and sent to lab. Patient denies any needs at this time.

## 2015-09-16 NOTE — Progress Notes (Unsigned)
Submitted PA through [employer]eds for: oxyCODONE-acetaminophen (PERCOCET) 10-325 MG tablet.

## 2015-09-16 NOTE — ED Notes (Signed)
Pt c/o left to central chest pain for the last thirty minutes with pain shooting to her left shoulder blade.  Pt has hx of herniated discs in neck is also c/o left arm numbness for several days.

## 2015-09-17 MED ORDER — CYCLOBENZAPRINE HCL 5 MG PO TABS: 5.0000 mg | ORAL_TABLET | Freq: Once | ORAL | Status: DC

## 2015-09-17 MED ORDER — GABAPENTIN 600 MG PO TABS: 300.0000 mg | ORAL_TABLET | Freq: Once | ORAL | Status: DC

## 2015-09-17 NOTE — Discharge Instructions (Signed)
Follow-up with your primary care provider within 2 days to be reevaluated. Call [facility] tomorrow to be seen by Dr. Lynann Bologna tomorrow or Friday. Take Flexeril as prescribed. Titrate up the gabapentin, take two 300 mg doses tomorrow, take three 300 mg doses Friday and continue with three 300mg  doses Saturday and Sunday.   Return to emergency department if your pain worsens, you lose the ability to use your arm, or any other unusual symptoms.  Cervical Radiculopathy Cervical radiculopathy happens when a nerve in the neck (cervical nerve) is pinched or bruised. This condition can develop because of an injury or as part of the normal aging process. Pressure on the cervical nerves can cause pain or numbness that runs from the neck all the way down into the arm and fingers. Usually, this condition gets better with rest. Treatment may be needed if the condition does not improve.  CAUSES This condition may be caused by:  Injury.  Slipped (herniated) disk.  Muscle tightness in the neck because of overuse.  Arthritis.  Breakdown or degeneration in the bones and joints of the spine (spondylosis) due to aging.  Bone spurs that may develop near the cervical nerves. SYMPTOMS Symptoms of this condition include:  Pain that runs from the neck to the arm and hand. The pain can be severe or irritating. It may be worse when the neck is moved.  Numbness or weakness in the affected arm and hand. DIAGNOSIS This condition may be diagnosed based on symptoms, medical history, and a physical exam. You may also have tests, including:  X-rays.  CT scan.  MRI.  Electromyogram (EMG).  Nerve conduction tests. TREATMENT In many cases, treatment is not needed for this condition. With rest, the condition usually gets better over time. If treatment is needed, options may include:  Wearing a soft neck collar for short periods of time.  Physical therapy to strengthen your neck muscles.  Medicines,  such as NSAIDs, oral corticosteroids, or spinal injections.  Surgery. This may be needed if other treatments do not help. Various types of surgery may be done depending on the cause of your problems. HOME CARE INSTRUCTIONS Managing Pain  Take over-the-counter and prescription medicines only as told by your health care provider.  If directed, apply ice to the affected area.  Put ice in a plastic bag.  Place a towel between your skin and the bag.  Leave the ice on for 20 minutes, 2-3 times per day.  If ice does not help, you can try using heat. Take a warm shower or warm bath, or use a heat pack as told by your health care provider.  Try a gentle neck and shoulder massage to help relieve symptoms. Activity  Rest as needed. Follow instructions from your health care provider about any restrictions on activities.  Do stretching and strengthening exercises as told by your health care provider or physical therapist. General Instructions  If you were given a soft collar, wear it as told by your health care provider.  Use a flat pillow when you sleep.  Keep all follow-up visits as told by your health care provider. This is important. SEEK MEDICAL CARE IF:  Your condition does not improve with treatment. SEEK IMMEDIATE MEDICAL CARE IF:  Your pain gets much worse and cannot be controlled with medicines.  You have weakness or numbness in your hand, arm, face, or leg.  You have a high fever.  You have a stiff, rigid neck.  You lose control of your  bowels or your bladder (have incontinence).  You have trouble with walking, balance, or speaking.   This information is not intended to replace advice given to you by your health care provider. Make sure you discuss any questions you have with your health care provider.   Document Released: 12/14/2000 Document Revised: 12/10/2014 Document Reviewed: 05/15/2014 Elsevier Interactive Patient Education [employer].

## 2015-09-17 NOTE — ED Provider Notes (Signed)
CSN: [id number]     Arrival date & time 09/16/15  2039 History   First MD Initiated Contact with Patient 09/16/15 2132     Chief Complaint  Patient presents with  . Chest Pain     (Consider location/radiation/quality/duration/timing/severity/associated sxs/prior Treatment) HPI   Patient is a 45 year old female with a history of HTN, osteoarthritis who presents the ED with left arm pain, neck pain for one month. She said the pain in her left arm is constant, burning, 10/10 that also exists and her left axilla and left breast. She states associated weakness of her left arm and hand. Patient has been seen by her PCP for these symptoms. Three days ago she had an MRI which revealed "Cervical spondylosis and degenerative disc disease. PCP states her symptoms are related to this MRI finding. She states she called her PCP today and she is to have a referral to a spine surgeon. She states she can no longer take the pain and wanted some relief. She has tried Percocet, Motrin, Tylenol, Aleve, tramadol, TENS unit without relief. She denies shortness of breath, dizziness, headache, blurred vision, diaphoresis, abdominal pain, fever, chills, nausea and vomiting.  Past Medical History  Diagnosis Date  . Anemia   . GERD (gastroesophageal reflux disease)   . Headache(784.0)   . Hypertension   . Osteoarthritis   . Seizure disorder ([place])   . Heart murmur   . PVC (premature ventricular contraction)    Past Surgical History  Procedure Laterality Date  . 2 ovarian cysts      age 50  . Esophagogastroduodenoscopy      for GERD  . Tubal ligation     Family History  Problem Relation Age of Onset  . Arthritis    . Hyperlipidemia    . Hypertension Daughter   . Stroke    . Leukemia Maternal Grandmother   . Asthma Sister   . Heart disease Maternal Grandmother    Social History  Substance Use Topics  . Smoking status: Never Smoker   . Smokeless tobacco: Never Used  . Alcohol Use: No   OB History     No data available     Review of Systems  Constitutional: Negative for fever and diaphoresis.  Respiratory: Negative for cough, chest tightness and shortness of breath.   Cardiovascular: Positive for chest pain.  Gastrointestinal: Negative for nausea and vomiting.  Musculoskeletal: Positive for myalgias and neck pain. Negative for back pain.      Allergies  Meperidine hcl; Metoclopramide hcl; Oxycodone-acetaminophen; Propoxyphene n-acetaminophen; Sumatriptan; and Codeine  Home Medications   Prior to Admission medications   Medication Sig Start Date End Date Taking? Authorizing Provider  amLODipine (NORVASC) 10 MG tablet TAKE ONE TABLET BY MOUTH EVERY MORNING 12/31/14   Laurey Morale, MD  cyclobenzaprine (FLEXERIL) 5 MG tablet Take 1 tablet (5 mg total) by mouth once. 09/17/15   Kalman Drape, PA  furosemide (LASIX) 40 MG tablet Take 1 tablet (40 mg total) by mouth daily. 08/21/15   Laurey Morale, MD  gabapentin (NEURONTIN) 600 MG tablet Take 0.5 tablets (300 mg total) by mouth once. Take 1/2 tablet twice tomorrow, take 1/2 tablet 3 times on Friday and each day after that. 09/17/15   Kalman Drape, PA  ibuprofen (ADVIL,MOTRIN) 800 MG tablet Take 1 tablet (800 mg total) by mouth every 8 (eight) hours as needed for moderate pain. 10/09/13   Laurey Morale, MD  metoprolol (LOPRESSOR) 100 MG tablet TAKE ONE TABLET  BY MOUTH TWICE DAILY 12/31/14   Laurey Morale, MD  Multiple Vitamin (MULTIVITAMIN WITH MINERALS) TABS Take 1 tablet by mouth every morning.    Historical Provider, MD  omeprazole (PRILOSEC) 40 MG capsule TAKE ONE CAPSULE BY MOUTH ONCE DAILY AS NEEDED FOR ACID REFLUX 12/31/14   Laurey Morale, MD  oxyCODONE-acetaminophen (PERCOCET) 10-325 MG tablet Take 1 tablet by mouth every 6 (six) hours as needed for pain. 09/04/15   Laurey Morale, MD  potassium chloride (K-DUR) 10 MEQ tablet TAKE ONE TABLET BY MOUTH ONCE DAILY 12/31/14   Laurey Morale, MD  promethazine (PHENERGAN) 25 MG tablet Take 1  tablet (25 mg total) by mouth every 4 (four) hours as needed for nausea or vomiting. 01/21/14   Laurey Morale, MD  traMADol (ULTRAM) 50 MG tablet Take 2 tablets (100 mg total) by mouth every 6 (six) hours as needed. 08/24/15   Laurey Morale, MD   BP 157/98 mmHg  Pulse 85  Temp(Src) 98.7 F (37.1 C) (Oral)  Resp 19  Ht 5\' 1"  (1.549 m)  Wt 74.39 kg  BMI 31.00 kg/m2  SpO2 100%  LMP 08/18/2015 Physical Exam  Constitutional: She appears well-developed and well-nourished. No distress.  HENT:  Head: Normocephalic and atraumatic.  Eyes: Conjunctivae are normal.  Neck:  Examination revealed no step-offs, no deformities, full AROM, TTP 2 cervical spine which reproduced pain and left breast, exquisitely tender to palpation of left trapezius and compression of the left trapezius reproduced pain in left arm and axilla,   Cardiovascular: Normal rate, regular rhythm and normal heart sounds.  Exam reveals no gallop and no friction rub.   No murmur heard. Pulmonary/Chest: Effort normal and breath sounds normal. No respiratory distress. She has no wheezes. She has no rales.  Abdominal: Soft. There is no tenderness.  Musculoskeletal:  Examination of bilateral upper extremities revealed no deformities, no edema, no ecchymosis, full AROM, decreased sensation in left upper extremity, strength 5/5 of right upper extremities and strength 4/5 of left upper extremities, neurovascularly intact distally.  Neurological: She is alert. Coordination normal.  Skin: Skin is warm and dry. No rash noted. She is not diaphoretic.  Psychiatric: She has a normal mood and affect. Her behavior is normal.    ED Course  Procedures (including critical care time) Labs Review Labs Reviewed  TROPONIN I    Imaging Review Dg Chest 2 View  09/16/2015  CLINICAL DATA:  Left chest pain for 1 hour.  Left scapula pain. EXAM: CHEST  2 VIEW COMPARISON:  04/19/2015 FINDINGS: Slightly low lung volumes on the frontal view. Otherwise, the  lungs are clear. Heart and mediastinum are within normal limits. The trachea is midline. No acute bone abnormality. IMPRESSION: No active cardiopulmonary disease. Electronically Signed   By: Markus Daft M.D.   On: 09/16/2015 22:36   I have personally reviewed and evaluated these images and lab results as part of my medical decision-making.   EKG Interpretation   Date/Time:  Wednesday September 16 2015 20:45:25 EDT Ventricular Rate:  92 PR Interval:  154 QRS Duration: 66 QT Interval:  336 QTC Calculation: 415 R Axis:   61 Text Interpretation:  Normal sinus rhythm Normal ECG agree. no change  Confirmed by Johnney Killian, MD, Jeannie Done [phone]) on 09/16/2015 9:22:42 PM      MDM   Final diagnoses:  Radiculopathy, cervical   Patient with chronic pain and numbness in her left upper extremity. Symptoms likely 2/2 cervical radiculopathy. MRI on 09/12/15 revealed  Cervical spondylosis and degenerative disc disease, progressive compared to the prior exam, with moderate to prominent impingement at C6-7 ; moderate impingement at C3-4 and C4-5; and mild impingement at C5-6, as detailed above. The dominant finding with left eccentricity is the left foraminal disc protrusion at C6-7 causing left foraminal impingement. Due to patient's recent imaging and chronic condition I did not feel there was need for further imaging at this time. Patient's chest pain was reproducible upon palpation of the cervical spine. Do not feel that her chest pain was cardiopulmonary in etiology. EKG with normal sinus rhythm, chest x-ray without acute abnormality, and troponin negative.  Patient is being followed by her PCP for this. I started the patient on gabapentin and Flexeril. Gave patient contact information for [facility], Dr. Lynann Bologna, to call him tomorrow to be seen either tomorrow or Friday.  Instructed the patient to follow up with her primary care provider by Friday. I discussed strict return precautions with the patient.  She expressed understanding to the discharge instructions.    Kalman Drape, PA 09/17/15 0142  Charlesetta Shanks, MD 09/20/15 2328

## 2015-09-17 NOTE — ED Notes (Signed)
Patient A&Ox4 and in NAD at discharge. Patient leaves with husband and denies any other needs at this time. Patient ambulatory at discharge.

## 2015-09-17 NOTE — ED Notes (Signed)
Pa  at bedside. 

## 2015-09-24 ENCOUNTER — Other Ambulatory Visit: Payer: Self-pay | Admitting: Orthopedic Surgery

## 2015-09-29 ENCOUNTER — Encounter (HOSPITAL_COMMUNITY)
Admission: RE | Admit: 2015-09-29 | Discharge: 2015-09-29 | Disposition: A | Discharge status: Home / Self Care | Payer: 59 | Source: Ambulatory Visit | Attending: Orthopedic Surgery | Admitting: Orthopedic Surgery

## 2015-09-29 ENCOUNTER — Encounter (HOSPITAL_COMMUNITY): Payer: Self-pay

## 2015-09-29 DIAGNOSIS — M79602 Pain in left arm: Secondary | ICD-10-CM | POA: Insufficient documentation

## 2015-09-29 DIAGNOSIS — Z01812 Encounter for preprocedural laboratory examination: Secondary | ICD-10-CM | POA: Diagnosis not present

## 2015-09-29 HISTORY — DX: Personal history of urinary calculi: Z87.442

## 2015-09-29 LAB — CBC WITH DIFFERENTIAL/PLATELET
BASOS PCT: 0 %
Basophils Absolute: 0 10*3/uL (ref 0.0–0.1)
EOS ABS: 0 10*3/uL (ref 0.0–0.7)
EOS PCT: 0 %
HCT: 37.2 % (ref 36.0–46.0)
Hemoglobin: 12.1 g/dL (ref 12.0–15.0)
LYMPHS ABS: 1.4 10*3/uL (ref 0.7–4.0)
Lymphocytes Relative: 20 %
MCH: 28 pg (ref 26.0–34.0)
MCHC: 32.5 g/dL (ref 30.0–36.0)
MCV: 86.1 fL (ref 78.0–100.0)
MONO ABS: 0.4 10*3/uL (ref 0.1–1.0)
MONOS PCT: 6 %
Neutro Abs: 5 10*3/uL (ref 1.7–7.7)
Neutrophils Relative %: 74 %
PLATELETS: 342 10*3/uL (ref 150–400)
RBC: 4.32 MIL/uL (ref 3.87–5.11)
RDW: 14.5 % (ref 11.5–15.5)
WBC: 6.9 10*3/uL (ref 4.0–10.5)

## 2015-09-29 LAB — COMPREHENSIVE METABOLIC PANEL
ALBUMIN: 3.8 g/dL (ref 3.5–5.0)
ALK PHOS: 60 U/L (ref 38–126)
ALT: 15 U/L (ref 14–54)
ANION GAP: 9 (ref 5–15)
AST: 21 U/L (ref 15–41)
BUN: 10 mg/dL (ref 6–20)
CALCIUM: 10.1 mg/dL (ref 8.9–10.3)
CHLORIDE: 105 mmol/L (ref 101–111)
CO2: 25 mmol/L (ref 22–32)
Creatinine, Ser: 0.65 mg/dL (ref 0.44–1.00)
GFR calc non Af Amer: >60 mL/min (ref >60)
Glucose, Bld: 93 mg/dL (ref 65–99)
POTASSIUM: 3.6 mmol/L (ref 3.5–5.1)
SODIUM: 139 mmol/L (ref 135–145)
Total Bilirubin: 0.3 mg/dL (ref 0.3–1.2)
Total Protein: 7.7 g/dL (ref 6.5–8.1)

## 2015-09-29 LAB — SURGICAL PCR SCREEN
MRSA, PCR: NEGATIVE
STAPHYLOCOCCUS AUREUS: NEGATIVE

## 2015-09-29 LAB — PROTIME-INR
INR: 1.25 (ref 0.00–1.49)
Prothrombin Time: 15.8 seconds — ABNORMAL HIGH (ref 11.6–15.2)

## 2015-09-29 LAB — APTT: aPTT: 28 seconds (ref 24–37)

## 2015-09-29 LAB — HCG, SERUM, QUALITATIVE: Preg, Serum: NEGATIVE

## 2015-09-29 MED ORDER — CEFAZOLIN SODIUM-DEXTROSE 2-4 GM/100ML-% IV SOLN: 2.0000 g | INTRAVENOUS | Status: AC

## 2015-09-29 NOTE — Pre-Procedure Instructions (Addendum)
Amy Barry  09/29/2015      [facility] [address], [place] [address] Phone: [phone] Fax: [phone]    Your procedure is scheduled on 09/30/15.  Report to [facility] Admitting at 800 A.M.  Call this number if you have problems the morning of surgery:  [phone]   Remember:  Do not eat food or drink liquids after midnight.  Take these medicines the morning of surgery with A SIP OF WATER amlodipine(norvasc),metoprolol,omeprazole,oxycodone if needed  STOP all herbel meds, nsaids (aleve,naproxen,advil,ibuprofen) NOW including vitamins, aspirin,aspercreme   Do not wear jewelry, make-up or nail polish.  Do not wear lotions, powders, or perfumes.  You may wear deoderant.  Do not shave 48 hours prior to surgery.  Men may shave face and neck.  Do not bring valuables to the hospital.  [facility] is not responsible for any belongings or valuables.  Contacts, dentures or bridgework may not be worn into surgery.  Leave your suitcase in the car.  After surgery it may be brought to your room.  For patients admitted to the hospital, discharge time will be determined by your treatment team.  Patients discharged the day of surgery will not be allowed to drive home.   Name and phone number of your driver:    Special instructions:   Special Instructions: [facility] - Preparing for Surgery  Before surgery, you can play an important role.  Because skin is not sterile, your skin needs to be as free of germs as possible.  You can reduce the number of germs on you skin by washing with CHG (chlorahexidine gluconate) soap before surgery.  CHG is an antiseptic cleaner which kills germs and bonds with the skin to continue killing germs even after washing.  Please DO NOT use if you have an allergy to CHG or antibacterial soaps.  If your skin becomes reddened/irritated stop using the CHG and inform your nurse  when you arrive at Short Stay.  Do not shave (including legs and underarms) for at least 48 hours prior to the first CHG shower.  You may shave your face.  Please follow these instructions carefully:   1.  Shower with CHG Soap the night before surgery and the morning of Surgery.  2.  If you choose to wash your hair, wash your hair first as usual with your normal shampoo.  3.  After you shampoo, rinse your hair and body thoroughly to remove the Shampoo.  4.  Use CHG as you would any other liquid soap.  You can apply chg directly  to the skin and wash gently with scrungie or a clean washcloth.  5.  Apply the CHG Soap to your body ONLY FROM THE NECK DOWN.  Do not use on open wounds or open sores.  Avoid contact with your eyes ears, mouth and genitals (private parts).  Wash genitals (private parts)       with your normal soap.  6.  Wash thoroughly, paying special attention to the area where your surgery will be performed.  7.  Thoroughly rinse your body with warm water from the neck down.  8.  DO NOT shower/wash with your normal soap after using and rinsing off the CHG Soap.  9.  Pat yourself dry with a clean towel.            10.  Wear clean pajamas.  11.  Place clean sheets on your bed the night of your first shower and do not sleep with pets.  Day of Surgery  Do not apply any lotions/deodorants the morning of surgery.  Please wear clean clothes to the hospital/surgery center.  Please read over the following fact sheets that you were given. Pain Booklet, Coughing and Deep Breathing, MRSA Information and Surgical Site Infection Prevention

## 2015-10-07 ENCOUNTER — Encounter (HOSPITAL_COMMUNITY): Admission: RE | Payer: Self-pay | Source: Ambulatory Visit

## 2015-10-07 ENCOUNTER — Ambulatory Visit (HOSPITAL_COMMUNITY): Admission: RE | Admit: 2015-10-07 | Payer: 59 | Source: Ambulatory Visit | Admitting: Orthopedic Surgery

## 2015-10-07 SURGERY — ANTERIOR CERVICAL DECOMPRESSION/DISCECTOMY FUSION 1 LEVEL
Anesthesia: General

## 2015-10-22 ENCOUNTER — Encounter (HOSPITAL_COMMUNITY): Payer: Self-pay

## 2015-10-22 ENCOUNTER — Other Ambulatory Visit: Payer: Self-pay | Admitting: Neurological Surgery

## 2015-10-22 NOTE — Progress Notes (Signed)
Call to [facility] & requested surgical orders.

## 2015-10-22 NOTE — Progress Notes (Signed)
Pt. Followed by S. Sharlene Motts for PCP, denies cardiac testing in the past. Instructed on proper meds to take PREOP.  Pt. Has been holding pain meds. & NSAIDS preop.

## 2015-10-23 ENCOUNTER — Ambulatory Visit (HOSPITAL_COMMUNITY)
Admission: RE | Admit: 2015-10-23 | Discharge: 2015-10-24 | Disposition: A | Discharge status: Home / Self Care | Payer: 59 | Source: Ambulatory Visit | Attending: Neurological Surgery | Admitting: Neurological Surgery

## 2015-10-23 ENCOUNTER — Encounter (HOSPITAL_COMMUNITY): Payer: Self-pay | Admitting: Neurological Surgery

## 2015-10-23 ENCOUNTER — Ambulatory Visit (HOSPITAL_COMMUNITY): Payer: 59 | Admitting: Certified Registered"

## 2015-10-23 ENCOUNTER — Ambulatory Visit (HOSPITAL_COMMUNITY): Payer: 59

## 2015-10-23 ENCOUNTER — Encounter (HOSPITAL_COMMUNITY)
Admission: RE | Disposition: A | Discharge status: Home / Self Care | Payer: Self-pay | Source: Ambulatory Visit | Attending: Neurological Surgery

## 2015-10-23 DIAGNOSIS — K219 Gastro-esophageal reflux disease without esophagitis: Secondary | ICD-10-CM | POA: Insufficient documentation

## 2015-10-23 DIAGNOSIS — Z419 Encounter for procedure for purposes other than remedying health state, unspecified: Secondary | ICD-10-CM

## 2015-10-23 DIAGNOSIS — Z981 Arthrodesis status: Secondary | ICD-10-CM

## 2015-10-23 DIAGNOSIS — M50123 Cervical disc disorder at C6-C7 level with radiculopathy: Secondary | ICD-10-CM | POA: Diagnosis not present

## 2015-10-23 DIAGNOSIS — I10 Essential (primary) hypertension: Secondary | ICD-10-CM | POA: Insufficient documentation

## 2015-10-23 HISTORY — DX: Irritable bowel syndrome, unspecified: K58.9

## 2015-10-23 HISTORY — PX: CERVICAL DISC ARTHROPLASTY: SHX587

## 2015-10-23 LAB — BASIC METABOLIC PANEL
Anion gap: 9 (ref 5–15)
BUN: 12 mg/dL (ref 6–20)
CHLORIDE: 108 mmol/L (ref 101–111)
CO2: 22 mmol/L (ref 22–32)
CREATININE: 0.61 mg/dL (ref 0.44–1.00)
Calcium: 9.3 mg/dL (ref 8.9–10.3)
GFR calc Af Amer: >60 mL/min (ref >60)
GFR calc non Af Amer: >60 mL/min (ref >60)
Glucose, Bld: 78 mg/dL (ref 65–99)
POTASSIUM: 3.5 mmol/L (ref 3.5–5.1)
SODIUM: 139 mmol/L (ref 135–145)

## 2015-10-23 LAB — HCG, SERUM, QUALITATIVE: Preg, Serum: NEGATIVE

## 2015-10-23 LAB — CBC WITH DIFFERENTIAL/PLATELET
Basophils Absolute: 0 10*3/uL (ref 0.0–0.1)
Basophils Relative: 0 %
EOS ABS: 0 10*3/uL (ref 0.0–0.7)
EOS PCT: 1 %
HCT: 35 % — ABNORMAL LOW (ref 36.0–46.0)
HEMOGLOBIN: 11.3 g/dL — AB (ref 12.0–15.0)
LYMPHS ABS: 1.7 10*3/uL (ref 0.7–4.0)
LYMPHS PCT: 26 %
MCH: 28.3 pg (ref 26.0–34.0)
MCHC: 32.3 g/dL (ref 30.0–36.0)
MCV: 87.7 fL (ref 78.0–100.0)
MONOS PCT: 8 %
Monocytes Absolute: 0.5 10*3/uL (ref 0.1–1.0)
NEUTROS PCT: 65 %
Neutro Abs: 4.3 10*3/uL (ref 1.7–7.7)
Platelets: 283 10*3/uL (ref 150–400)
RBC: 3.99 MIL/uL (ref 3.87–5.11)
RDW: 15.1 % (ref 11.5–15.5)
WBC: 6.6 10*3/uL (ref 4.0–10.5)

## 2015-10-23 LAB — PROTIME-INR
INR: 1.12 (ref 0.00–1.49)
Prothrombin Time: 14.6 seconds (ref 11.6–15.2)

## 2015-10-23 SURGERY — CERVICAL ANTERIOR DISC ARTHROPLASTY
Anesthesia: General

## 2015-10-23 MED ORDER — CHLORHEXIDINE GLUCONATE CLOTH 2 % EX PADS: 6.0000 | MEDICATED_PAD | Freq: Once | CUTANEOUS | Status: DC

## 2015-10-23 MED ORDER — LIDOCAINE 2% (20 MG/ML) 5 ML SYRINGE
INTRAMUSCULAR | Status: AC
  Filled 2015-10-23: qty 5

## 2015-10-23 MED ORDER — SUCCINYLCHOLINE CHLORIDE 200 MG/10ML IV SOSY
PREFILLED_SYRINGE | INTRAVENOUS | Status: AC
  Filled 2015-10-23: qty 10

## 2015-10-23 MED ORDER — ROCURONIUM BROMIDE 50 MG/5ML IV SOLN
INTRAVENOUS | Status: AC
  Filled 2015-10-23: qty 1

## 2015-10-23 MED ORDER — ONDANSETRON HCL 4 MG/2ML IJ SOLN
INTRAMUSCULAR | Status: AC
  Filled 2015-10-23: qty 2

## 2015-10-23 MED ORDER — METHOCARBAMOL 500 MG PO TABS: 500.0000 mg | ORAL_TABLET | Freq: Four times a day (QID) | ORAL | Status: DC | PRN

## 2015-10-23 MED ORDER — PHENYLEPHRINE 40 MCG/ML (10ML) SYRINGE FOR IV PUSH (FOR BLOOD PRESSURE SUPPORT)
PREFILLED_SYRINGE | INTRAVENOUS | Status: AC
  Filled 2015-10-23: qty 10

## 2015-10-23 MED ORDER — FENTANYL CITRATE (PF) 250 MCG/5ML IJ SOLN
INTRAMUSCULAR | Status: AC
  Filled 2015-10-23: qty 5

## 2015-10-23 MED ORDER — MIDAZOLAM HCL 2 MG/2ML IJ SOLN
INTRAMUSCULAR | Status: DC | PRN
  Administered 2015-10-23: 2 mg via INTRAVENOUS

## 2015-10-23 MED ORDER — PHENOL 1.4 % MT LIQD
1.0000 | OROMUCOSAL | Status: DC | PRN
  Filled 2015-10-23: qty 177

## 2015-10-23 MED ORDER — PANTOPRAZOLE SODIUM 40 MG PO TBEC: 40.0000 mg | DELAYED_RELEASE_TABLET | Freq: Every day | ORAL | Status: DC

## 2015-10-23 MED ORDER — MORPHINE SULFATE (PF) 2 MG/ML IV SOLN
1.0000 mg | INTRAVENOUS | Status: DC | PRN
  Administered 2015-10-23: 2 mg via INTRAVENOUS
  Filled 2015-10-23: qty 1

## 2015-10-23 MED ORDER — PHENYLEPHRINE 40 MCG/ML (10ML) SYRINGE FOR IV PUSH (FOR BLOOD PRESSURE SUPPORT)
PREFILLED_SYRINGE | INTRAVENOUS | Status: DC | PRN
  Administered 2015-10-23: 80 ug via INTRAVENOUS
  Administered 2015-10-23: 40 ug via INTRAVENOUS

## 2015-10-23 MED ORDER — SODIUM CHLORIDE 0.9% FLUSH: 3.0000 mL | Freq: Two times a day (BID) | INTRAVENOUS | Status: DC

## 2015-10-23 MED ORDER — METOPROLOL TARTRATE 25 MG PO TABS
100.0000 mg | ORAL_TABLET | Freq: Two times a day (BID) | ORAL | Status: DC
  Administered 2015-10-23: 100 mg via ORAL
  Filled 2015-10-23: qty 4

## 2015-10-23 MED ORDER — AMLODIPINE BESYLATE 10 MG PO TABS: 10.0000 mg | ORAL_TABLET | Freq: Every morning | ORAL | Status: DC

## 2015-10-23 MED ORDER — CEFAZOLIN SODIUM-DEXTROSE 2-4 GM/100ML-% IV SOLN
2.0000 g | INTRAVENOUS | Status: AC
  Administered 2015-10-23: 2 g via INTRAVENOUS
  Filled 2015-10-23: qty 100

## 2015-10-23 MED ORDER — ACETAMINOPHEN 325 MG PO TABS: 650.0000 mg | ORAL_TABLET | ORAL | Status: DC | PRN

## 2015-10-23 MED ORDER — MENTHOL 3 MG MT LOZG: 1.0000 | LOZENGE | OROMUCOSAL | Status: DC | PRN

## 2015-10-23 MED ORDER — PROPOFOL 10 MG/ML IV BOLUS
INTRAVENOUS | Status: AC
  Filled 2015-10-23: qty 20

## 2015-10-23 MED ORDER — METHOCARBAMOL 1000 MG/10ML IJ SOLN
500.0000 mg | Freq: Four times a day (QID) | INTRAMUSCULAR | Status: DC | PRN
  Filled 2015-10-23: qty 5

## 2015-10-23 MED ORDER — DEXAMETHASONE SODIUM PHOSPHATE 10 MG/ML IJ SOLN
INTRAMUSCULAR | Status: AC
  Filled 2015-10-23: qty 1

## 2015-10-23 MED ORDER — PROMETHAZINE HCL 25 MG/ML IJ SOLN: 6.2500 mg | INTRAMUSCULAR | Status: DC | PRN

## 2015-10-23 MED ORDER — FUROSEMIDE 40 MG PO TABS: 40.0000 mg | ORAL_TABLET | Freq: Every day | ORAL | Status: DC

## 2015-10-23 MED ORDER — OXYCODONE-ACETAMINOPHEN 10-325 MG PO TABS: 1.0000 | ORAL_TABLET | Freq: Four times a day (QID) | ORAL | Status: DC | PRN

## 2015-10-23 MED ORDER — CEFAZOLIN IN D5W 1 GM/50ML IV SOLN
1.0000 g | Freq: Three times a day (TID) | INTRAVENOUS | Status: DC
  Administered 2015-10-23: 1 g via INTRAVENOUS
  Filled 2015-10-23: qty 50

## 2015-10-23 MED ORDER — ACETAMINOPHEN 10 MG/ML IV SOLN
INTRAVENOUS | Status: AC
  Administered 2015-10-23: 1000 mg via INTRAVENOUS
  Filled 2015-10-23: qty 100

## 2015-10-23 MED ORDER — SCOPOLAMINE 1 MG/3DAYS TD PT72
MEDICATED_PATCH | TRANSDERMAL | Status: AC
  Administered 2015-10-23: 1 via TRANSDERMAL
  Filled 2015-10-23: qty 1

## 2015-10-23 MED ORDER — FENTANYL CITRATE (PF) 100 MCG/2ML IJ SOLN
INTRAMUSCULAR | Status: AC
  Administered 2015-10-23: 50 ug via INTRAVENOUS
  Filled 2015-10-23: qty 2

## 2015-10-23 MED ORDER — MIDAZOLAM HCL 2 MG/2ML IJ SOLN
INTRAMUSCULAR | Status: AC
  Filled 2015-10-23: qty 2

## 2015-10-23 MED ORDER — DEXAMETHASONE SODIUM PHOSPHATE 10 MG/ML IJ SOLN
10.0000 mg | INTRAMUSCULAR | Status: AC
  Administered 2015-10-23: 10 mg via INTRAVENOUS
  Filled 2015-10-23: qty 1

## 2015-10-23 MED ORDER — 0.9 % SODIUM CHLORIDE (POUR BTL) OPTIME
TOPICAL | Status: DC | PRN
  Administered 2015-10-23: 1000 mL

## 2015-10-23 MED ORDER — ONDANSETRON HCL 4 MG/2ML IJ SOLN: 4.0000 mg | INTRAMUSCULAR | Status: DC | PRN

## 2015-10-23 MED ORDER — SODIUM CHLORIDE 0.9% FLUSH: 3.0000 mL | INTRAVENOUS | Status: DC | PRN

## 2015-10-23 MED ORDER — POTASSIUM CHLORIDE IN NACL 20-0.9 MEQ/L-% IV SOLN
INTRAVENOUS | Status: DC
  Filled 2015-10-23 (×2): qty 1000

## 2015-10-23 MED ORDER — PROPOFOL 10 MG/ML IV BOLUS
INTRAVENOUS | Status: DC | PRN
  Administered 2015-10-23: 170 mg via INTRAVENOUS

## 2015-10-23 MED ORDER — ROCURONIUM BROMIDE 100 MG/10ML IV SOLN
INTRAVENOUS | Status: DC | PRN
  Administered 2015-10-23: 40 mg via INTRAVENOUS

## 2015-10-23 MED ORDER — FENTANYL CITRATE (PF) 250 MCG/5ML IJ SOLN
INTRAMUSCULAR | Status: DC | PRN
  Administered 2015-10-23: 100 ug via INTRAVENOUS
  Administered 2015-10-23: 50 ug via INTRAVENOUS
  Administered 2015-10-23: 100 ug via INTRAVENOUS
  Administered 2015-10-23: 50 ug via INTRAVENOUS

## 2015-10-23 MED ORDER — FENTANYL CITRATE (PF) 250 MCG/5ML IJ SOLN
INTRAMUSCULAR | Status: AC
  Filled 2015-10-23: qty 10

## 2015-10-23 MED ORDER — LACTATED RINGERS IV SOLN
INTRAVENOUS | Status: DC
  Administered 2015-10-23 (×3): via INTRAVENOUS

## 2015-10-23 MED ORDER — ACETAMINOPHEN 650 MG RE SUPP: 650.0000 mg | RECTAL | Status: DC | PRN

## 2015-10-23 MED ORDER — THROMBIN 5000 UNITS EX SOLR
OROMUCOSAL | Status: DC | PRN
  Administered 2015-10-23: 13:00:00 via TOPICAL

## 2015-10-23 MED ORDER — LIDOCAINE 2% (20 MG/ML) 5 ML SYRINGE
INTRAMUSCULAR | Status: DC | PRN
  Administered 2015-10-23: 80 mg via INTRAVENOUS

## 2015-10-23 MED ORDER — SODIUM CHLORIDE 0.9 % IR SOLN
Status: DC | PRN
  Administered 2015-10-23: 13:00:00

## 2015-10-23 MED ORDER — THROMBIN 20000 UNITS EX SOLR
CUTANEOUS | Status: DC | PRN
  Administered 2015-10-23: 13:00:00 via TOPICAL

## 2015-10-23 MED ORDER — LIDOCAINE HCL 4 % MT SOLN
OROMUCOSAL | Status: DC | PRN
  Administered 2015-10-23: 4 mL via TOPICAL

## 2015-10-23 MED ORDER — ONDANSETRON HCL 4 MG/2ML IJ SOLN
INTRAMUSCULAR | Status: DC | PRN
  Administered 2015-10-23: 4 mg via INTRAVENOUS

## 2015-10-23 MED ORDER — SUGAMMADEX SODIUM 200 MG/2ML IV SOLN
INTRAVENOUS | Status: DC | PRN
  Administered 2015-10-23: 150 mg via INTRAVENOUS

## 2015-10-23 MED ORDER — FENTANYL CITRATE (PF) 100 MCG/2ML IJ SOLN
25.0000 ug | INTRAMUSCULAR | Status: DC | PRN
  Administered 2015-10-23 (×2): 50 ug via INTRAVENOUS

## 2015-10-23 MED ORDER — OXYCODONE-ACETAMINOPHEN 5-325 MG PO TABS: 1.0000 | ORAL_TABLET | ORAL | Status: DC | PRN

## 2015-10-23 SURGICAL SUPPLY — 48 items
APL SKNCLS STERI-STRIP NONHPOA (GAUZE/BANDAGES/DRESSINGS) ×1
BAG DECANTER FOR FLEXI CONT (MISCELLANEOUS) ×2 IMPLANT
BENZOIN TINCTURE PRP APPL 2/3 (GAUZE/BANDAGES/DRESSINGS) ×2 IMPLANT
BIT DRILL PRESTIGE (BIT) ×2 IMPLANT
BUR MATCHSTICK NEURO 3.0 LAGG (BURR) ×2 IMPLANT
CANISTER SUCT 3000ML PPV (MISCELLANEOUS) ×2 IMPLANT
DISC CERVICAL PRESTIGE 6X16 (Orthopedic Implant) ×2 IMPLANT
DRAPE C-ARM 42X72 X-RAY (DRAPES) ×4 IMPLANT
DRAPE LAPAROTOMY 100X72 PEDS (DRAPES) ×2 IMPLANT
DRAPE MICROSCOPE LEICA (MISCELLANEOUS) ×2 IMPLANT
DRAPE POUCH INSTRU U-SHP 10X18 (DRAPES) ×2 IMPLANT
DRSG OPSITE POSTOP 3X4 (GAUZE/BANDAGES/DRESSINGS) ×2 IMPLANT
DURAPREP 6ML APPLICATOR 50/CS (WOUND CARE) ×2 IMPLANT
ELECT COATED BLADE 2.86 ST (ELECTRODE) ×2 IMPLANT
ELECT REM PT RETURN 9FT ADLT (ELECTROSURGICAL) ×2
ELECTRODE REM PT RTRN 9FT ADLT (ELECTROSURGICAL) ×1 IMPLANT
GAUZE SPONGE 4X4 16PLY XRAY LF (GAUZE/BANDAGES/DRESSINGS) IMPLANT
GLOVE BIO SURGEON STRL SZ8 (GLOVE) ×2 IMPLANT
GLOVE BIOGEL PI IND STRL 6.5 (GLOVE) ×1 IMPLANT
GLOVE BIOGEL PI INDICATOR 6.5 (GLOVE) ×1
GLOVE ECLIPSE 6.5 STRL STRAW (GLOVE) ×2 IMPLANT
GLOVE INDICATOR 6.5 STRL GRN (GLOVE) ×2 IMPLANT
GLOVE INDICATOR 7.5 STRL GRN (GLOVE) ×2 IMPLANT
GLOVE SURG SS PI 7.0 STRL IVOR (GLOVE) ×2 IMPLANT
GOWN STRL REUS W/ TWL LRG LVL3 (GOWN DISPOSABLE) ×2 IMPLANT
GOWN STRL REUS W/ TWL XL LVL3 (GOWN DISPOSABLE) ×2 IMPLANT
GOWN STRL REUS W/TWL 2XL LVL3 (GOWN DISPOSABLE) IMPLANT
GOWN STRL REUS W/TWL LRG LVL3 (GOWN DISPOSABLE) ×2
GOWN STRL REUS W/TWL XL LVL3 (GOWN DISPOSABLE) ×2
HALTER HD/CHIN CERV TRACTION D (MISCELLANEOUS) IMPLANT
HEMOSTAT POWDER KIT SURGIFOAM (HEMOSTASIS) ×2 IMPLANT
KIT BASIN OR (CUSTOM PROCEDURE TRAY) ×2 IMPLANT
KIT ROOM TURNOVER OR (KITS) ×2 IMPLANT
NEEDLE HYPO 25X1 1.5 SAFETY (NEEDLE) ×2 IMPLANT
NEEDLE SPNL 20GX3.5 QUINCKE YW (NEEDLE) ×2 IMPLANT
NS IRRIG 1000ML POUR BTL (IV SOLUTION) ×2 IMPLANT
PACK LAMINECTOMY NEURO (CUSTOM PROCEDURE TRAY) ×2 IMPLANT
PAD ARMBOARD 7.5X6 YLW CONV (MISCELLANEOUS) ×2 IMPLANT
PIN DISTRACTION 14MM (PIN) ×4 IMPLANT
RUBBERBAND STERILE (MISCELLANEOUS) ×4 IMPLANT
SPONGE INTESTINAL PEANUT (DISPOSABLE) ×2 IMPLANT
SPONGE SURGIFOAM ABS GEL 100 (HEMOSTASIS) ×2 IMPLANT
STRIP CLOSURE SKIN 1/2X4 (GAUZE/BANDAGES/DRESSINGS) ×2 IMPLANT
SUT VIC AB 3-0 SH 8-18 (SUTURE) ×2 IMPLANT
TOWEL OR 17X24 6PK STRL BLUE (TOWEL DISPOSABLE) ×2 IMPLANT
TOWEL OR 17X26 10 PK STRL BLUE (TOWEL DISPOSABLE) ×2 IMPLANT
TRAP SPECIMEN MUCOUS 40CC (MISCELLANEOUS) ×2 IMPLANT
WATER STERILE IRR 1000ML POUR (IV SOLUTION) ×2 IMPLANT

## 2015-10-23 NOTE — Anesthesia Postprocedure Evaluation (Signed)
Anesthesia Post Note  Patient: Amy Barry  Procedure(s) Performed: Procedure(s) (LRB): Cervical Disc Arthroplasty Cervical six-seven (N/A)  Patient location during evaluation: PACU Anesthesia Type: General Level of consciousness: awake Pain management: pain level controlled Vital Signs Assessment: post-procedure vital signs reviewed and stable Respiratory status: spontaneous breathing Cardiovascular status: stable Anesthetic complications: no    Last Vitals:  Filed Vitals:   10/23/15 1514 10/23/15 1515  BP:  135/79  Pulse:  99  Temp: 36.3 C   Resp:  58    Last Pain:  Filed Vitals:   10/23/15 1518  PainSc: 7                  EDWARDS,Alsha Meland

## 2015-10-23 NOTE — H&P (Signed)
Subjective:   Patient is a 45 y.o. female admitted for Cervical disc herniation. The patient first presented to me with complaints of neck pain and shooting pains in the arm(s). Onset of symptoms was several weeks ago. The pain is described as aching and occurs all day. The pain is rated intense, and is located  In the neck and radiates to the arm. The symptoms have been progressive. Symptoms are exacerbated by extending head backwards, and are relieved by none.  Previous work up includes MRI of cervical spine, results: disc bulge at C6-C7 left.  Past Medical History  Diagnosis Date  . Anemia   . GERD (gastroesophageal reflux disease)   . Headache(784.0)   . Hypertension   . Osteoarthritis   . Seizure disorder ([place])     denied seizures- has dyskinesia  . PVC (premature ventricular contraction)   . Dysrhythmia     pvc's holter monitor started lopressor- no return visit  . Heart murmur     child no echo  . History of kidney stones   . Sleep concern 2013    toldf that she doesn 't have apnea but she stays sleeping a lot  . Pneumonia     January- tx with IV antibiotics  for pneumonia in 04/2015   . Neuromuscular disorder ([place])   . IBS (irritable bowel syndrome)     Past Surgical History  Procedure Laterality Date  . 2 ovarian cysts      age 84  . Esophagogastroduodenoscopy      for GERD  . Tubal ligation    . Laparoscopic abdominal exploration      dx ibs    Allergies  Allergen Reactions  . Metoclopramide Hcl Other (See Comments)    Shakiness   . Sumatriptan Other (See Comments)    High blood pressure  . Tramadol Nausea Only  . Codeine Palpitations  . Meperidine Hcl Nausea And Vomiting  . Oxycodone-Acetaminophen Other (See Comments)    Drowsy  . Propoxyphene N-Acetaminophen Nausea And Vomiting    Social History  Substance Use Topics  . Smoking status: Never Smoker   . Smokeless tobacco: Never Used  . Alcohol Use: No    Family History  Problem Relation Age of  Onset  . Arthritis    . Hyperlipidemia    . Hypertension Daughter   . Stroke    . Leukemia Maternal Grandmother   . Asthma Sister   . Heart disease Maternal Grandmother    Prior to Admission medications   Medication Sig Start Date End Date Taking? Authorizing Provider  acetaminophen (TYLENOL) 500 MG tablet Take 500-1,000 mg by mouth every 6 (six) hours as needed for moderate pain or headache.   Yes Historical Provider, MD  amLODipine (NORVASC) 10 MG tablet TAKE ONE TABLET BY MOUTH EVERY MORNING Patient taking differently: TAKE 10 MG BY MOUTH EVERY MORNING 12/31/14  Yes Laurey Morale, MD  cyclobenzaprine (FLEXERIL) 5 MG tablet Take 1 tablet (5 mg total) by mouth once. Patient taking differently: Take 5 mg by mouth daily.  09/17/15  Yes Kalman Drape, PA  furosemide (LASIX) 40 MG tablet Take 1 tablet (40 mg total) by mouth daily. 08/21/15  Yes Laurey Morale, MD  metoprolol (LOPRESSOR) 100 MG tablet TAKE ONE TABLET BY MOUTH TWICE DAILY Patient taking differently: TAKE 100 MG BY MOUTH TWICE DAILY 12/31/14  Yes Laurey Morale, MD  Multiple Vitamin (MULTIVITAMIN WITH MINERALS) TABS Take 1 tablet by mouth every morning.   Yes Historical  Provider, MD  omeprazole (PRILOSEC) 40 MG capsule TAKE ONE CAPSULE BY MOUTH ONCE DAILY AS NEEDED FOR ACID REFLUX Patient taking differently: TAKE 40 MG BY MOUTH ONCE DAILY AS NEEDED FOR ACID REFLUX 12/31/14  Yes Laurey Morale, MD  oxyCODONE-acetaminophen (PERCOCET) 10-325 MG tablet Take 1 tablet by mouth every 6 (six) hours as needed for pain. 09/04/15  Yes Laurey Morale, MD  potassium chloride (K-DUR) 10 MEQ tablet TAKE ONE TABLET BY MOUTH ONCE DAILY Patient taking differently: TAKE 10 MEQ BY MOUTH ONCE DAILY 12/31/14  Yes Laurey Morale, MD  gabapentin (NEURONTIN) 600 MG tablet Take 0.5 tablets (300 mg total) by mouth once. Take 1/2 tablet twice tomorrow, take 1/2 tablet 3 times on Friday and each day after that. Patient not taking: Reported on 10/20/2015 09/17/15    Kalman Drape, PA  ibuprofen (ADVIL,MOTRIN) 800 MG tablet Take 1 tablet (800 mg total) by mouth every 8 (eight) hours as needed for moderate pain. 10/09/13   Laurey Morale, MD  Menthol, Topical Analgesic, (BIOFREEZE EX) Apply 1 application topically as needed (for muscle pain).    Historical Provider, MD  Menthol, Topical Analgesic, (ICY HOT EX) Apply 1 application topically as needed (for muscle pain).    Historical Provider, MD  promethazine (PHENERGAN) 25 MG tablet Take 1 tablet (25 mg total) by mouth every 4 (four) hours as needed for nausea or vomiting. 01/21/14   Laurey Morale, MD  traMADol (ULTRAM) 50 MG tablet Take 2 tablets (100 mg total) by mouth every 6 (six) hours as needed. Patient taking differently: Take 100 mg by mouth every 6 (six) hours as needed for moderate pain.  08/24/15   Laurey Morale, MD     Review of Systems  Positive ROS: neg  All other systems have been reviewed and were otherwise negative with the exception of those mentioned in the HPI and as above.  Objective: Vital signs in last 24 hours: Temp:  [98.9 F (37.2 C)] 98.9 F (37.2 C) (07/21 1127) Pulse Rate:  [86] 86 (07/21 1127) Resp:  [16] 16 (07/21 1127) BP: (147)/(99) 147/99 mmHg (07/21 1127) SpO2:  [100 %] 100 % (07/21 1127) Weight:  [73.936 kg (163 lb)] 73.936 kg (163 lb) (07/21 1127)  General Appearance: Alert, cooperative, no distress, appears stated age Head: Normocephalic, without obvious abnormality, atraumatic Eyes: PERRL, conjunctiva/corneas clear, EOM's intact      Neck: Supple, symmetrical, trachea midline, Back: Symmetric, no curvature, ROM normal, no CVA tenderness Lungs:  respirations unlabored Heart: Regular rate and rhythm Abdomen: Soft, non-tender Extremities: Extremities normal, atraumatic, no cyanosis or edema Pulses: 2+ and symmetric all extremities Skin: Skin color, texture, turgor normal, no rashes or lesions  NEUROLOGIC:  Mental status: Alert and oriented x4, no aphasia,  good attention span, fund of knowledge and memory  Motor Exam - grossly normal Sensory Exam - grossly normal Reflexes: 1+ Coordination - grossly normal Gait - grossly normal Balance - grossly normal Cranial Nerves: I: smell Not tested  II: visual acuity  OS: nl    OD: nl  II: visual fields Full to confrontation  II: pupils Equal, round, reactive to light  III,VII: ptosis None  III,IV,VI: extraocular muscles  Full ROM  V: mastication Normal  V: facial light touch sensation  Normal  V,VII: corneal reflex  Present  VII: facial muscle function - upper  Normal  VII: facial muscle function - lower Normal  VIII: hearing Not tested  IX: soft palate elevation  Normal  IX,X: gag reflex Present  XI: trapezius strength  5/5  XI: sternocleidomastoid strength 5/5  XI: neck flexion strength  5/5  XII: tongue strength  Normal    Data Review Lab Results  Component Value Date   WBC 6.6 10/23/2015   HGB 11.3* 10/23/2015   HCT 35.0* 10/23/2015   MCV 87.7 10/23/2015   PLT 283 10/23/2015   Lab Results  Component Value Date   NA 139 10/23/2015   K 3.5 10/23/2015   CL 108 10/23/2015   CO2 22 10/23/2015   BUN 12 10/23/2015   CREATININE 0.61 10/23/2015   GLUCOSE 78 10/23/2015   Lab Results  Component Value Date   INR 1.12 10/23/2015    Assessment:   Cervical neck pain with herniated nucleus pulposus/ 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 Patient has failed conservative therapy. Planned surgery : Cervical disc arthroplasty C6-7  Plan:   I explained the condition and procedure to the patient and answered any questions.  Patient wishes to proceed with procedure as planned. Understands risks/ benefits/ and expected or typical outcomes.  Bonne Whack S 10/23/2015 1:08 PM

## 2015-10-23 NOTE — Anesthesia Preprocedure Evaluation (Addendum)
Anesthesia Evaluation  Patient identified by MRN, date of birth, ID band  Reviewed: Allergy & Precautions, NPO status , Patient's Chart, lab work & pertinent test results  Airway Mallampati: II  TM Distance: >3 FB Neck ROM: Full    Dental   Pulmonary pneumonia,    breath sounds clear to auscultation       Cardiovascular hypertension, + dysrhythmias + Valvular Problems/Murmurs  Rhythm:Regular Rate:Normal     Neuro/Psych    GI/Hepatic Neg liver ROS, GERD  ,  Endo/Other  negative endocrine ROS  Renal/GU negative Renal ROS     Musculoskeletal   Abdominal   Peds  Hematology   Anesthesia Other Findings   Reproductive/Obstetrics                            Anesthesia Physical Anesthesia Plan  ASA: III  Anesthesia Plan: General   Post-op Pain Management:    Induction: Intravenous  Airway Management Planned:   Additional Equipment:   Intra-op Plan:   Post-operative Plan: Extubation in OR  Informed Consent: I have reviewed the patients History and Physical, chart, labs and discussed the procedure including the risks, benefits and alternatives for the proposed anesthesia with the patient or authorized representative who has indicated his/her understanding and acceptance.   Dental advisory given  Plan Discussed with: CRNA and Anesthesiologist  Anesthesia Plan Comments:         Anesthesia Quick Evaluation

## 2015-10-23 NOTE — Discharge Summary (Signed)
Date of Admission: 10/23/2015  Date of Discharge: 10/23/2015  Admission Diagnosis: HNP C6-7  Discharge Diagnosis: Same  Procedure Performed: C6-7 arthroplasty  Attending: Eustace Moore, MD  Hospital Course:  The patient was admitted for the above listed operation and had an uncomplicated post-operative course.  They were discharged in stable condition.  Follow up: 3 weeks    Medication List    TAKE these medications        acetaminophen 500 MG tablet  Commonly known as:  TYLENOL  Take 500-1,000 mg by mouth every 6 (six) hours as needed for moderate pain or headache.     amLODipine 10 MG tablet  Commonly known as:  NORVASC  TAKE ONE TABLET BY MOUTH EVERY MORNING     BIOFREEZE EX  Apply 1 application topically as needed (for muscle pain).     cyclobenzaprine 5 MG tablet  Commonly known as:  FLEXERIL  Take 1 tablet (5 mg total) by mouth once.     furosemide 40 MG tablet  Commonly known as:  LASIX  Take 1 tablet (40 mg total) by mouth daily.     gabapentin 600 MG tablet  Commonly known as:  NEURONTIN  Take 0.5 tablets (300 mg total) by mouth once. Take 1/2 tablet twice tomorrow, take 1/2 tablet 3 times on Friday and each day after that.     ibuprofen 800 MG tablet  Commonly known as:  ADVIL,MOTRIN  Take 1 tablet (800 mg total) by mouth every 8 (eight) hours as needed for moderate pain.     ICY HOT EX  Apply 1 application topically as needed (for muscle pain).     metoprolol 100 MG tablet  Commonly known as:  LOPRESSOR  TAKE ONE TABLET BY MOUTH TWICE DAILY     multivitamin with minerals Tabs tablet  Take 1 tablet by mouth every morning.     omeprazole 40 MG capsule  Commonly known as:  PRILOSEC  TAKE ONE CAPSULE BY MOUTH ONCE DAILY AS NEEDED FOR ACID REFLUX     oxyCODONE-acetaminophen 10-325 MG tablet  Commonly known as:  PERCOCET  Take 1 tablet by mouth every 6 (six) hours as needed for pain.     potassium chloride 10 MEQ tablet  Commonly known as:   K-DUR  TAKE ONE TABLET BY MOUTH ONCE DAILY     promethazine 25 MG tablet  Commonly known as:  PHENERGAN  Take 1 tablet (25 mg total) by mouth every 4 (four) hours as needed for nausea or vomiting.     traMADol 50 MG tablet  Commonly known as:  ULTRAM  Take 2 tablets (100 mg total) by mouth every 6 (six) hours as needed.

## 2015-10-23 NOTE — Progress Notes (Signed)
Orthopedic Tech Progress Note Patient Details:  Amy Barry 07/04/1970 [id number]  Ortho Devices Type of Ortho Device: Soft collar Ortho Device/Splint Interventions: Ordered, Application   Karolee Stamps 10/23/2015, 10:42 PM

## 2015-10-23 NOTE — Transfer of Care (Signed)
Immediate Anesthesia Transfer of Care Note  Patient: EVALINA CREED  Procedure(s) Performed: Procedure(s): Cervical Disc Arthroplasty Cervical six-seven (N/A)  Patient Location: PACU  Anesthesia Type:General  Level of Consciousness: awake, alert  and oriented  Airway & Oxygen Therapy: Patient Spontanous Breathing and Patient connected to nasal cannula oxygen  Post-op Assessment: Report given to RN, Post -op Vital signs reviewed and stable and Patient moving all extremities  Post vital signs: Reviewed and stable  Last Vitals:  Filed Vitals:   10/23/15 1514 10/23/15 1515  BP:  135/79  Pulse:  99  Temp: 36.3 C   Resp:  58    Last Pain:  Filed Vitals:   10/23/15 1518  PainSc: 7       Patients Stated Pain Goal: 2 ([id number])  Complications: No apparent anesthesia complications

## 2015-10-23 NOTE — Anesthesia Procedure Notes (Signed)
Procedure Name: Intubation Date/Time: 10/23/2015 1:29 PM Performed by: Trixie Deis A Pre-anesthesia Checklist: Patient identified, Emergency Drugs available, Suction available and Patient being monitored Patient Re-evaluated:Patient Re-evaluated prior to inductionOxygen Delivery Method: Circle System Utilized Preoxygenation: Pre-oxygenation with 100% oxygen Intubation Type: IV induction Ventilation: Mask ventilation without difficulty Laryngoscope Size: Mac and 3 Grade View: Grade I Tube type: Oral Tube size: 7.0 mm Number of attempts: 1 Airway Equipment and Method: Stylet and Oral airway Placement Confirmation: ETT inserted through vocal cords under direct vision,  positive ETCO2 and breath sounds checked- equal and bilateral Tube secured with: Tape Dental Injury: Teeth and Oropharynx as per pre-operative assessment

## 2015-10-23 NOTE — Op Note (Signed)
10/23/2015  3:50 PM  PATIENT:  Amy Barry  45 y.o. 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 Decompressive anterior cervical discectomy C6-7, 2. Cervical disc arthroplasty C6-7 with a prestige LP device, 3. Microdissection  SURGEON:  Sherley Bounds, MD  ASSISTANTS: ditty  ANESTHESIA:   General  EBL: 50 ml  Total I/O In: 1200 [I.V.:1200] Out: 50 [Blood:50]  BLOOD ADMINISTERED:none  DRAINS: none  SPECIMEN:  No Specimen  INDICATION FOR PROCEDURE: This patient presented with a left C7 radiculopathy. MRI showed a left foraminal disc herniation. She tried medical management and injection therapy without relief. Recommended cervical disc arthroplasty. Patient understood the risks, benefits, and alternatives and potential outcomes and wished to proceed.  PROCEDURE DETAILS: Patient was brought to the operating room placed under general endotracheal anesthesia. Patient was placed in the supine position on the operating room table. The neck was prepped with Duraprep and draped in a sterile fashion.   Three cc of local anesthesia was injected and a transverse incision was made on the right side of the neck.  Dissection was carried down thru the subcutaneous tissue and the platysma was  elevated, opened, and undermined with Metzenbaum scissors.  Dissection was then carried out thru an avascular plane leaving the sternocleidomastoid carotid artery and jugular vein laterally and the trachea and esophagus medially. The ventral aspect of the vertebral column was identified and a localizing x-ray was taken. The C6-7 level was identified. The longus colli muscles were then elevated and the retractor was placed. The annulus was incised and the disc space entered. Discectomy was performed with micro-curettes and pituitary rongeurs. I then used the high-speed drill to drill the endplates down to the level of  the posterior longitudinal ligament. The drill shavings were saved in a mucous trap for later arthrodesis. The operating microscope was draped and brought into the field provided additional magnification, illumination and visualization. Discectomy was continued posteriorly thru the disc space. Posterior longitudinal ligament was opened with a nerve hook, and then removed along with disc herniation and osteophytes, decompressing the spinal canal and thecal sac. We then continued to remove osteophytic overgrowth and disc material decompressing the neural foramina and exiting nerve roots bilaterally. The scope was angled up and down to help decompress and undercut the vertebral bodies. Once the decompression was completed we could pass a nerve hook circumferentially to assure adequate decompression in the midline and in the neural foramina. So by both visualization and palpation we felt we had an adequate decompression of the neural elements. I then used the trials to determine the correct size of the device. The 6 x 16 mm trial fit the best. I then drilled holes through a drill guide and then used the cutting chisel to prepare the tracks for the prestige LP device. The disc space was dried with Surgifoam. We then placed the device utilizing lateral fluoroscopy. The wound was irrigated with bacitracin solution, checked for hemostasis which was established and confirmed. Once meticulous hemostasis was achieved, we then proceeded with closure. The platysma was closed with interrupted 3-0 undyed Vicryl suture, the subcuticular layer was closed with interrupted 3-0 undyed Vicryl suture. The skin edges were approximated with steristrips. The drapes were removed. A sterile dressing was applied. The patient was then awakened from general anesthesia and transferred to the recovery room in stable condition. At the end of the procedure all sponge, needle and instrument counts were  correct.   PLAN OF CARE: Admit for overnight  observation  PATIENT DISPOSITION:  PACU - hemodynamically stable.   Delay start of Pharmacological VTE agent (>24hrs) due to surgical blood loss or risk of bleeding:  yes

## 2015-10-24 DIAGNOSIS — M50123 Cervical disc disorder at C6-C7 level with radiculopathy: Secondary | ICD-10-CM | POA: Diagnosis not present

## 2015-10-24 NOTE — Progress Notes (Signed)
Discharged instructions/education/Rx given to patient with husband at bedside and they both verbalized understanding, Patient very anxious to go home,  the writer tried to convince patient to stay until morning but patient insisted of going home and wanted to rest on her bed as verbalized by patient. Patient voiding well, walked in hallway with minimal assist and tolerated her food intake with no problem. Incision is dry, no swelling, no redness, no drainage noted. Patient is alert and steady and not groggy anymore. Discharged patient via wheelchair.

## 2015-10-26 ENCOUNTER — Encounter (HOSPITAL_COMMUNITY): Payer: Self-pay | Admitting: Neurological Surgery

## 2016-02-19 ENCOUNTER — Encounter: Payer: Self-pay | Admitting: Adult Health

## 2016-02-19 ENCOUNTER — Ambulatory Visit (INDEPENDENT_AMBULATORY_CARE_PROVIDER_SITE_OTHER): Payer: 59 | Admitting: Adult Health

## 2016-02-19 VITALS — BP 160/100 | HR 90 | Temp 98.9°F | Ht 61.5 in | Wt 167.0 lb

## 2016-02-19 DIAGNOSIS — R110 Nausea: Secondary | ICD-10-CM

## 2016-02-19 DIAGNOSIS — J0141 Acute recurrent pansinusitis: Secondary | ICD-10-CM

## 2016-02-19 MED ORDER — ONDANSETRON HCL 4 MG/2ML IJ SOLN
4.0000 mg | Freq: Once | INTRAMUSCULAR | Status: AC
  Administered 2016-02-19: 4 mg via INTRAMUSCULAR

## 2016-02-19 MED ORDER — ONDANSETRON 4 MG PO TBDP: 4.0000 mg | ORAL_TABLET | Freq: Once | ORAL | Status: DC

## 2016-02-19 MED ORDER — ONDANSETRON HCL 4 MG PO TABS: 4.0000 mg | ORAL_TABLET | Freq: Three times a day (TID) | ORAL | 0 refills | Status: DC | PRN

## 2016-02-19 MED ORDER — DOXYCYCLINE HYCLATE 100 MG PO CAPS: 100.0000 mg | ORAL_CAPSULE | Freq: Two times a day (BID) | ORAL | 0 refills | Status: DC

## 2016-02-19 NOTE — Patient Instructions (Signed)
It was great meeting you today.   I have sent in a prescription for Zofran - use this for nausea as well as Doxycycline- take this as needed for the sinus infection.   Please start using Flonase as well    Follow up if no improvement   General Recommendations:    Please drink plenty of fluids.  Get plenty of rest   Sleep in humidified air  Use saline nasal sprays  Netti pot   OTC Medications:  Decongestants - helps relieve congestion   Flonase (generic fluticasone) or Nasacort (generic triamcinolone) - please make sure to use the "cross-over" technique at a 45 degree angle towards the opposite eye as opposed to straight up the nasal passageway.   Sudafed (generic pseudoephedrine - Note this is the one that is available behind the pharmacy counter); Products with phenylephrine (-PE) may also be used but is often not as effective as pseudoephedrine.   If you have HIGH BLOOD PRESSURE - Coricidin HBP; AVOID any product that is -D as this contains pseudoephedrine which may increase your blood pressure.  Afrin (oxymetazoline) every 6-8 hours for up to 3 days.   Allergies - helps relieve runny nose, itchy eyes and sneezing   Claritin (generic loratidine), Allegra (fexofenidine), or Zyrtec (generic cyrterizine) for runny nose. These medications should not cause drowsiness.  Note - Benadryl (generic diphenhydramine) may be used however may cause drowsiness  Cough -   Delsym or Robitussin (generic dextromethorphan)  Expectorants - helps loosen mucus to ease removal   Mucinex (generic guaifenesin) as directed on the package.  Headaches / General Aches   Tylenol (generic acetaminophen) - DO NOT EXCEED 3 grams (3,000 mg) in a 24 hour time period  Advil/Motrin (generic ibuprofen)   Sore Throat -   Salt water gargle   Chloraseptic (generic benzocaine) spray or lozenges / Sucrets (generic dyclonine)    Sinusitis Sinusitis is redness, soreness, and inflammation of  the paranasal sinuses. Paranasal sinuses are air pockets within the bones of your face (beneath the eyes, the middle of the forehead, or above the eyes). In healthy paranasal sinuses, mucus is able to drain out, and air is able to circulate through them by way of your nose. However, when your paranasal sinuses are inflamed, mucus and air can become trapped. This can allow bacteria and other germs to grow and cause infection. Sinusitis can develop quickly and last only a short time (acute) or continue over a long period (chronic). Sinusitis that lasts for more than 12 weeks is considered chronic.  CAUSES  Causes of sinusitis include:  Allergies.  Structural abnormalities, such as displacement of the cartilage that separates your nostrils (deviated septum), which can decrease the air flow through your nose and sinuses and affect sinus drainage.  Functional abnormalities, such as when the small hairs (cilia) that line your sinuses and help remove mucus do not work properly or are not present. SIGNS AND SYMPTOMS  Symptoms of acute and chronic sinusitis are the same. The primary symptoms are pain and pressure around the affected sinuses. Other symptoms include:  Upper toothache.  Earache.  Headache.  Bad breath.  Decreased sense of smell and taste.  A cough, which worsens when you are lying flat.  Fatigue.  Fever.  Thick drainage from your nose, which often is green and may contain pus (purulent).  Swelling and warmth over the affected sinuses. DIAGNOSIS  Your health care provider will perform a physical exam. During the exam, your health care provider  may:  Look in your nose for signs of abnormal growths in your nostrils (nasal polyps).  Tap over the affected sinus to check for signs of infection.  View the inside of your sinuses (endoscopy) using an imaging device that has a light attached (endoscope). If your health care provider suspects that you have chronic sinusitis, one or  more of the following tests may be recommended:  Allergy tests.  Nasal culture. A sample of mucus is taken from your nose, sent to a lab, and screened for bacteria.  Nasal cytology. A sample of mucus is taken from your nose and examined by your health care provider to determine if your sinusitis is related to an allergy. TREATMENT  Most cases of acute sinusitis are related to a viral infection and will resolve on their own within 10 days. Sometimes medicines are prescribed to help relieve symptoms (pain medicine, decongestants, nasal steroid sprays, or saline sprays).  However, for sinusitis related to a bacterial infection, your health care provider will prescribe antibiotic medicines. These are medicines that will help kill the bacteria causing the infection.  Rarely, sinusitis is caused by a fungal infection. In theses cases, your health care provider will prescribe antifungal medicine. For some cases of chronic sinusitis, surgery is needed. Generally, these are cases in which sinusitis recurs more than 3 times per year, despite other treatments. HOME CARE INSTRUCTIONS   Drink plenty of water. Water helps thin the mucus so your sinuses can drain more easily.  Use a humidifier.  Inhale steam 3 to 4 times a day (for example, sit in the bathroom with the shower running).  Apply a warm, moist washcloth to your face 3 to 4 times a day, or as directed by your health care provider.  Use saline nasal sprays to help moisten and clean your sinuses.  Take medicines only as directed by your health care provider.  If you were prescribed either an antibiotic or antifungal medicine, finish it all even if you start to feel better. SEEK IMMEDIATE MEDICAL CARE IF:  You have increasing pain or severe headaches.  You have nausea, vomiting, or drowsiness.  You have swelling around your face.  You have vision problems.  You have a stiff neck.  You have difficulty breathing. MAKE SURE YOU:    Understand these instructions.  Will watch your condition.  Will get help right away if you are not doing well or get worse. Document Released: 03/21/2005 Document Revised: 08/05/2013 Document Reviewed: 04/05/2011 [facility] Patient Information 2015 [place], [place]. This information is not intended to replace advice given to you by your health care provider. Make sure you discuss any questions you have with your health care provider.

## 2016-02-19 NOTE — Progress Notes (Signed)
Subjective:    Patient ID: Amy Barry, female    DOB: 07-21-1970, 45 y.o.   MRN: [id number]  URI   This is a new problem. Episode onset: 3 days  The problem has been rapidly improving. The maximum temperature recorded prior to her arrival was 100.4 - 100.9 F. The fever has been present for 1 to 2 days. Associated symptoms include congestion, coughing (productive ), headaches, nausea, rhinorrhea, sinus pain, a sore throat and vomiting (2 days ago ). Pertinent negatives include no abdominal pain, diarrhea, ear pain or wheezing. She has tried decongestant (Vicks cold ) for the symptoms. The treatment provided no relief.      Review of Systems  Constitutional: Positive for activity change, appetite change, fatigue and fever. Negative for chills and diaphoresis.  HENT: Positive for congestion, postnasal drip, rhinorrhea, sinus pain, sinus pressure and sore throat. Negative for ear discharge and ear pain.   Eyes: Negative.   Respiratory: Positive for cough (productive ). Negative for chest tightness, shortness of breath and wheezing.   Gastrointestinal: Positive for nausea and vomiting (2 days ago ). Negative for abdominal pain, constipation and diarrhea.  Genitourinary: Negative.   Musculoskeletal: Negative.   Skin: Negative.   Allergic/Immunologic: Negative.   Neurological: Positive for headaches.  Psychiatric/Behavioral: Negative.   All other systems reviewed and are negative.  Past Medical History:  Diagnosis Date  . Anemia   . Dysrhythmia    pvc's holter monitor started lopressor- no return visit  . GERD (gastroesophageal reflux disease)   . Headache(784.0)   . Heart murmur    child no echo  . History of kidney stones   . Hypertension   . IBS (irritable bowel syndrome)   . Neuromuscular disorder ([place])   . Osteoarthritis   . Pneumonia    January- tx with IV antibiotics  for pneumonia in 04/2015   . PVC (premature ventricular contraction)   . Seizure disorder ([place])    denied  seizures- has dyskinesia  . Sleep concern 2013   toldf that she doesn 't have apnea but she stays sleeping a lot    Social History   Social History  . Marital status: Married    Spouse name: N/A  . Number of children: N/A  . Years of education: N/A   Occupational History  . RN [employer]   Social History Main Topics  . Smoking status: Never Smoker  . Smokeless tobacco: Never Used  . Alcohol use No  . Drug use: No  . Sexual activity: Not on file   Other Topics Concern  . Not on file   Social History Narrative  . No narrative on file    Past Surgical History:  Procedure Laterality Date  . 2 ovarian cysts     age 39  . CERVICAL DISC ARTHROPLASTY N/A 10/23/2015   Procedure: Cervical Disc Arthroplasty Cervical six-seven;  Surgeon: Eustace Moore, MD;  Location: [place] [facility];  Service: Neurosurgery;  Laterality: N/A;  . ESOPHAGOGASTRODUODENOSCOPY     for GERD  . LAPAROSCOPIC ABDOMINAL EXPLORATION     dx ibs  . TUBAL LIGATION      Family History  Problem Relation Age of Onset  . Arthritis    . Hyperlipidemia    . Hypertension Daughter   . Stroke    . Leukemia Maternal Grandmother   . Asthma Sister   . Heart disease Maternal Grandmother     Allergies  Allergen Reactions  . Metoclopramide Hcl Other (See  Comments)    Shakiness   . Sumatriptan Other (See Comments)    High blood pressure  . Tramadol Nausea Only  . Codeine Palpitations  . Meperidine Hcl Nausea And Vomiting  . Oxycodone-Acetaminophen Other (See Comments)    Drowsy  . Propoxyphene N-Acetaminophen Nausea And Vomiting    Current Outpatient Prescriptions on File Prior to Visit  Medication Sig Dispense Refill  . acetaminophen (TYLENOL) 500 MG tablet Take 500-1,000 mg by mouth every 6 (six) hours as needed for moderate pain or headache.    Marland Kitchen amLODipine (NORVASC) 10 MG tablet TAKE ONE TABLET BY MOUTH EVERY MORNING (Patient taking differently: TAKE 10 MG BY MOUTH EVERY MORNING) 90 tablet 3  .  furosemide (LASIX) 40 MG tablet Take 1 tablet (40 mg total) by mouth daily. 90 tablet 3  . ibuprofen (ADVIL,MOTRIN) 800 MG tablet Take 1 tablet (800 mg total) by mouth every 8 (eight) hours as needed for moderate pain. 180 tablet 3  . metoprolol (LOPRESSOR) 100 MG tablet TAKE ONE TABLET BY MOUTH TWICE DAILY (Patient taking differently: TAKE 100 MG BY MOUTH TWICE DAILY) 180 tablet 3  . Multiple Vitamin (MULTIVITAMIN WITH MINERALS) TABS Take 1 tablet by mouth every morning.    Marland Kitchen omeprazole (PRILOSEC) 40 MG capsule TAKE ONE CAPSULE BY MOUTH ONCE DAILY AS NEEDED FOR ACID REFLUX (Patient taking differently: TAKE 40 MG BY MOUTH ONCE DAILY AS NEEDED FOR ACID REFLUX) 90 capsule 3  . potassium chloride (K-DUR) 10 MEQ tablet TAKE ONE TABLET BY MOUTH ONCE DAILY (Patient taking differently: TAKE 10 MEQ BY MOUTH ONCE DAILY) 90 tablet 3   No current facility-administered medications on file prior to visit.     BP (!) 160/100 (BP Location: Left Arm, Patient Position: Sitting, Cuff Size: Normal)   Pulse 90   Temp 98.9 F (37.2 C) (Oral)   Ht 5' 1.5" (1.562 m)   Wt 167 lb (75.8 kg)   SpO2 99%   BMI 31.04 kg/m       Objective:   Physical Exam  Constitutional: She appears well-developed and well-nourished. No distress.  HENT:  Head: Normocephalic and atraumatic.  Right Ear: Hearing, tympanic membrane, external ear and ear canal normal.  Left Ear: Hearing, tympanic membrane, external ear and ear canal normal.  Nose: Mucosal edema and rhinorrhea present. Right sinus exhibits maxillary sinus tenderness and frontal sinus tenderness. Left sinus exhibits maxillary sinus tenderness and frontal sinus tenderness.  Mouth/Throat: Uvula is midline and mucous membranes are normal. Oropharyngeal exudate present. No posterior oropharyngeal edema, posterior oropharyngeal erythema or tonsillar abscesses.  Eyes: Conjunctivae and EOM are normal. Pupils are equal, round, and reactive to light. Right eye exhibits no  discharge. Left eye exhibits no discharge. No scleral icterus.  Neck: Normal range of motion. Neck supple. Thyromegaly present.  Cardiovascular: Normal rate, regular rhythm, normal heart sounds and intact distal pulses.  Exam reveals no gallop and no friction rub.   No murmur heard. Pulmonary/Chest: Effort normal and breath sounds normal. No respiratory distress. She has no wheezes. She has no rales. She exhibits no tenderness.  Abdominal:  Unable to perform due to nausea  Lymphadenopathy:    She has no cervical adenopathy.  Neurological: She is alert.  Skin: Skin is warm and dry. No rash noted. She is not diaphoretic. No erythema. No pallor.  Psychiatric: She has a normal mood and affect. Her behavior is normal. Judgment and thought content normal.  Nursing note and vitals reviewed.     Assessment & Plan:  1. Acute recurrent pansinusitis - Lungs clear, no concern for pneumonia at this time.  - will treat due to symptoms with doxycycline.  - doxycycline (VIBRAMYCIN) 100 MG capsule; Take 1 capsule (100 mg total) by mouth 2 (two) times daily.  Dispense: 14 capsule; Refill: 0 - Add flonase - Stay hydrated and rest - Follow up if no improvement or if symptoms worsen    2. Nausea without vomiting  - ondansetron (ZOFRAN) 4 MG tablet; Take 1 tablet (4 mg total) by mouth every 8 (eight) hours as needed for nausea or vomiting.  Dispense: 20 tablet; Refill: 0 - ondansetron (ZOFRAN) injection 4 mg; Inject 2 mLs (4 mg total) into the muscle once.  Dorothyann Peng, NP

## 2016-02-19 NOTE — Progress Notes (Signed)
Pre visit review using our clinic review tool, if applicable. No additional management support is needed unless otherwise documented below in the visit note. 

## 2016-05-09 ENCOUNTER — Ambulatory Visit (INDEPENDENT_AMBULATORY_CARE_PROVIDER_SITE_OTHER): Payer: 59 | Admitting: Family Medicine

## 2016-05-09 ENCOUNTER — Encounter: Payer: Self-pay | Admitting: Family Medicine

## 2016-05-09 VITALS — BP 142/97 | HR 83 | Temp 98.3°F | Ht 61.5 in | Wt 166.0 lb

## 2016-05-09 DIAGNOSIS — R079 Chest pain, unspecified: Secondary | ICD-10-CM

## 2016-05-09 DIAGNOSIS — R1013 Epigastric pain: Secondary | ICD-10-CM

## 2016-05-09 LAB — CBC WITH DIFFERENTIAL/PLATELET
BASOS PCT: 0.2 % (ref 0.0–3.0)
Basophils Absolute: 0 10*3/uL (ref 0.0–0.1)
EOS ABS: 0 10*3/uL (ref 0.0–0.7)
EOS PCT: 0.4 % (ref 0.0–5.0)
HCT: 36.1 % (ref 36.0–46.0)
HEMOGLOBIN: 11.8 g/dL — AB (ref 12.0–15.0)
LYMPHS ABS: 1.7 10*3/uL (ref 0.7–4.0)
Lymphocytes Relative: 25.9 % (ref 12.0–46.0)
MCHC: 32.7 g/dL (ref 30.0–36.0)
MCV: 87.7 fl (ref 78.0–100.0)
MONO ABS: 0.5 10*3/uL (ref 0.1–1.0)
Monocytes Relative: 8.1 % (ref 3.0–12.0)
NEUTROS PCT: 65.4 % (ref 43.0–77.0)
Neutro Abs: 4.4 10*3/uL (ref 1.4–7.7)
Platelets: 294 10*3/uL (ref 150.0–400.0)
RBC: 4.11 Mil/uL (ref 3.87–5.11)
RDW: 14.9 % (ref 11.5–15.5)
WBC: 6.7 10*3/uL (ref 4.0–10.5)

## 2016-05-09 LAB — HEPATIC FUNCTION PANEL
ALT: 7 U/L (ref 0–35)
AST: 12 U/L (ref 0–37)
Albumin: 4.1 g/dL (ref 3.5–5.2)
Alkaline Phosphatase: 64 U/L (ref 39–117)
Bilirubin, Direct: 0.1 mg/dL (ref 0.0–0.3)
TOTAL PROTEIN: 7.5 g/dL (ref 6.0–8.3)
Total Bilirubin: 0.3 mg/dL (ref 0.2–1.2)

## 2016-05-09 LAB — BASIC METABOLIC PANEL
BUN: 9 mg/dL (ref 6–23)
CO2: 28 meq/L (ref 19–32)
CREATININE: 0.62 mg/dL (ref 0.40–1.20)
Calcium: 9.3 mg/dL (ref 8.4–10.5)
Chloride: 104 mEq/L (ref 96–112)
GFR: 133.61 mL/min (ref >60.00)
Glucose, Bld: 82 mg/dL (ref 70–99)
POTASSIUM: 3.5 meq/L (ref 3.5–5.1)
Sodium: 140 mEq/L (ref 135–145)

## 2016-05-09 LAB — TSH: TSH: 1.53 u[IU]/mL (ref 0.35–4.50)

## 2016-05-09 LAB — LIPASE: LIPASE: 16 U/L (ref 11.0–59.0)

## 2016-05-09 LAB — AMYLASE: AMYLASE: 46 U/L (ref 27–131)

## 2016-05-09 MED ORDER — OMEPRAZOLE 40 MG PO CPDR: 40.0000 mg | DELAYED_RELEASE_CAPSULE | Freq: Two times a day (BID) | ORAL | 3 refills | Status: DC

## 2016-05-09 NOTE — Progress Notes (Signed)
   Subjective:    Patient ID: Amy Barry, female    DOB: Dec 31, 1970, 46 y.o.   MRN: [id number]  HPI Here for 3 straight days of epigastric pain which sometimes radiates up into the chest. This has been continuous although it waxes and wanes. No SOB or fever or coughing. She has a long hx of GERD but this feels different from that. She still takes Prilosec every day. She is nauseated but has not vomited. Eating food makes the pain worse.    Review of Systems  Constitutional: Negative.   Respiratory: Negative.   Cardiovascular: Positive for chest pain. Negative for palpitations and leg swelling.  Gastrointestinal: Positive for abdominal pain and nausea. Negative for abdominal distention, anal bleeding, blood in stool, constipation, diarrhea, rectal pain and vomiting.  Neurological: Negative.        Objective:   Physical Exam  Constitutional: She is oriented to person, place, and time. She appears well-developed and well-nourished. No distress.  Neck: No thyromegaly present.  Cardiovascular: Normal rate, regular rhythm and intact distal pulses.   Soft 2/6 SM. Her EKG is within normal limits   Pulmonary/Chest: Effort normal and breath sounds normal. No respiratory distress. She has no wheezes. She has no rales. She exhibits no tenderness.  Abdominal: Soft. Bowel sounds are normal. She exhibits no distension and no mass. There is no rebound and no guarding.  Tender in the epigastrium   Lymphadenopathy:    She has no cervical adenopathy.  Neurological: She is alert and oriented to person, place, and time.          Assessment & Plan:  Epigastric pain, suspicious for a hiatal hernia. She will stay on Prilosec for now. Get labs and set up an [name] soon. Refer to GI. Alysia Penna, MD

## 2016-05-09 NOTE — Progress Notes (Signed)
Pre visit review using our clinic review tool, if applicable. No additional management support is needed unless otherwise documented below in the visit note. 

## 2016-05-10 ENCOUNTER — Encounter: Payer: Self-pay | Admitting: Internal Medicine

## 2016-05-16 ENCOUNTER — Ambulatory Visit
Admission: RE | Admit: 2016-05-16 | Discharge: 2016-05-16 | Disposition: A | Discharge status: Home / Self Care | Payer: 59 | Source: Ambulatory Visit | Attending: Family Medicine | Admitting: Family Medicine

## 2016-05-16 DIAGNOSIS — R1013 Epigastric pain: Secondary | ICD-10-CM | POA: Diagnosis not present

## 2016-06-06 DIAGNOSIS — I10 Essential (primary) hypertension: Secondary | ICD-10-CM | POA: Diagnosis not present

## 2016-06-06 DIAGNOSIS — R5383 Other fatigue: Secondary | ICD-10-CM | POA: Diagnosis not present

## 2016-06-06 DIAGNOSIS — Z0000 Encounter for general adult medical examination without abnormal findings: Secondary | ICD-10-CM | POA: Diagnosis not present

## 2016-06-13 ENCOUNTER — Ambulatory Visit (INDEPENDENT_AMBULATORY_CARE_PROVIDER_SITE_OTHER): Payer: 59 | Admitting: Internal Medicine

## 2016-06-13 ENCOUNTER — Encounter: Payer: Self-pay | Admitting: Internal Medicine

## 2016-06-13 VITALS — BP 112/80 | HR 76 | Ht 61.5 in | Wt 165.8 lb

## 2016-06-13 DIAGNOSIS — R1310 Dysphagia, unspecified: Secondary | ICD-10-CM

## 2016-06-13 DIAGNOSIS — R1013 Epigastric pain: Secondary | ICD-10-CM

## 2016-06-13 NOTE — Progress Notes (Signed)
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 Sarajane Jews with chief complaints of difficulty swallowing, chest pain, and epigastric discomfort. The patient was in her usual state of health until approximately 6 weeks ago when she developed severe lower chest and epigastric pain with consumption of liquids or solids. No history of dysphagia prior. She does have a history of GERD for which she had been taking PPI on demand. Seen by Dr. Sarajane Jews 05/09/2016. Encounter reviewed. I'll work that day including CBC, comprehensive metabolic panel, amylase, and lipase were unremarkable. Abdominal ultrasound performed 05/16/2016 revealed stable (since 2010) small gallbladder polyp. No other abnormalities. She is sent today for follow-up. Patient reports that her severe pain with swallowing has resolved. However she does described a low-grade constant discomfort which is not affected by anything in particular. Shows feel that the more regular use of PPI has helped. Patient takes number of medications. As well she had been on doxycycline previously for respiratory complaints. Not clear if she took any of her medications just prior to the development of symptoms. She states that she does take her medications with plenty of water. She also takes potassium supplement. She does have history of more chronic nausea for which she has Zofran. GI review of systems is otherwise negative.  REVIEW OF SYSTEMS:  All non-GI ROS negative except for fatigue, visual change, muscle pains, heart murmur, shortness of breath  Past Medical History:  Diagnosis Date  . Anemia   . Dysrhythmia    pvc's holter monitor started lopressor- no return visit  . GERD (gastroesophageal reflux disease)   . Headache(784.0)   . Heart murmur    child no echo  . History of kidney stones   . Hypertension   . IBS (irritable bowel syndrome)   .  Neuromuscular disorder ([place])   . Osteoarthritis   . Pneumonia    January- tx with IV antibiotics  for pneumonia in 04/2015   . PVC (premature ventricular contraction)   . Seizure disorder ([place])    denied seizures- has dyskinesia  . Sleep concern 2013   toldf that she doesn 't have apnea but she stays sleeping a lot    Past Surgical History:  Procedure Laterality Date  . 2 ovarian cysts     age 20  . CERVICAL DISC ARTHROPLASTY N/A 10/23/2015   Procedure: Cervical Disc Arthroplasty Cervical six-seven;  Surgeon: Eustace Moore, MD;  Location: [facility];  Service: Neurosurgery;  Laterality: N/A;  . ESOPHAGOGASTRODUODENOSCOPY     for GERD  . LAPAROSCOPIC ABDOMINAL EXPLORATION     dx ibs  . TUBAL LIGATION      Social History TRICHELLE LEHAN  reports that she has never smoked. She has never used smokeless tobacco. She reports that she does not drink alcohol or use drugs.  family history includes Asthma in her sister; Heart disease in her maternal grandmother; Hypertension in her daughter; Leukemia in her maternal grandmother.  Allergies  Allergen Reactions  . Metoclopramide Hcl Other (See Comments)    Shakiness   . Sumatriptan Other (See Comments)    High blood pressure  . Tramadol Nausea Only  . Codeine Palpitations  . Meperidine Hcl Nausea And Vomiting  . Oxycodone-Acetaminophen Other (See Comments)    Drowsy  . Propoxyphene N-Acetaminophen Nausea And Vomiting       PHYSICAL EXAMINATION: Vital signs: BP  112/80   Pulse 76   Ht 5' 1.5" (1.562 m)   Wt 165 lb 12.8 oz (75.2 kg)   LMP 06/02/2016   BMI 30.82 kg/m   Constitutional: generally well-appearing, no acute distress Psychiatric: alert and oriented x3, cooperative Eyes: extraocular movements intact, anicteric, conjunctiva pink Mouth: oral pharynx moist, no lesions Neck: supple no lymphadenopathy Cardiovascular: heart regular rate and rhythm, systolic murmur present Lungs: clear to auscultation  bilaterally Abdomen: soft, nontender, nondistended, no obvious ascites, no peritoneal signs, normal bowel sounds, no organomegaly Rectal: Omitted Extremities: no clubbing cyanosis or lower extremity edema bilaterally Skin: no lesions on visible extremities Neuro: No focal deficits. Cranial nerves intact  ASSESSMENT:  #1. Odynophagia/dysphagia. By history most consistent with pill esophagitis. Improving #2. History of GERD. Symptoms On Regular Dose of PPI  PLAN:  1. Continue PPI 2. Schedule diagnostic upper endoscopy.The nature of the procedure, as well as the risks, benefits, and alternatives were carefully and thoroughly reviewed with the patient. Ample time for discussion and questions allowed. The patient understood, was satisfied, and agreed to proceed. 3. Take pills with plenty of water  A copy of this consultation note has been sent to Dr. Sarajane Jews

## 2016-06-13 NOTE — Patient Instructions (Signed)
You have been scheduled for an endoscopy. Please follow written instructions given to you at your visit today. If you use inhalers (even only as needed), please bring them with you on the day of your procedure. Your physician has requested that you go to www.startemmi.com and enter the access code given to you at your visit today. This web site gives a general overview about your procedure. However, you should still follow specific instructions given to you by our office regarding your preparation for the procedure.   Per Dr. Henrene Pastor, stay on your PPI

## 2016-06-23 ENCOUNTER — Other Ambulatory Visit: Payer: Self-pay | Admitting: Obstetrics and Gynecology

## 2016-06-23 ENCOUNTER — Other Ambulatory Visit (HOSPITAL_COMMUNITY)
Admission: RE | Admit: 2016-06-23 | Discharge: 2016-06-23 | Disposition: A | Discharge status: Home / Self Care | Payer: 59 | Source: Ambulatory Visit | Attending: Obstetrics and Gynecology | Admitting: Obstetrics and Gynecology

## 2016-06-23 DIAGNOSIS — N6310 Unspecified lump in the right breast, unspecified quadrant: Secondary | ICD-10-CM

## 2016-06-23 DIAGNOSIS — Z1151 Encounter for screening for human papillomavirus (HPV): Secondary | ICD-10-CM | POA: Insufficient documentation

## 2016-06-23 DIAGNOSIS — N921 Excessive and frequent menstruation with irregular cycle: Secondary | ICD-10-CM | POA: Diagnosis not present

## 2016-06-23 DIAGNOSIS — Z113 Encounter for screening for infections with a predominantly sexual mode of transmission: Secondary | ICD-10-CM | POA: Diagnosis present

## 2016-06-23 DIAGNOSIS — R102 Pelvic and perineal pain: Secondary | ICD-10-CM | POA: Diagnosis not present

## 2016-06-23 DIAGNOSIS — R928 Other abnormal and inconclusive findings on diagnostic imaging of breast: Secondary | ICD-10-CM

## 2016-06-23 DIAGNOSIS — Z01419 Encounter for gynecological examination (general) (routine) without abnormal findings: Secondary | ICD-10-CM | POA: Insufficient documentation

## 2016-06-27 LAB — CYTOLOGY - PAP
Chlamydia: NEGATIVE
DIAGNOSIS: NEGATIVE
HPV (WINDOPATH): NOT DETECTED
Neisseria Gonorrhea: NEGATIVE

## 2016-06-29 ENCOUNTER — Ambulatory Visit (AMBULATORY_SURGERY_CENTER): Payer: 59 | Admitting: Internal Medicine

## 2016-06-29 ENCOUNTER — Encounter: Payer: Self-pay | Admitting: Internal Medicine

## 2016-06-29 VITALS — BP 97/63 | HR 64 | Temp 98.0°F | Resp 15 | Ht 61.5 in | Wt 165.0 lb

## 2016-06-29 DIAGNOSIS — K219 Gastro-esophageal reflux disease without esophagitis: Secondary | ICD-10-CM

## 2016-06-29 DIAGNOSIS — R1013 Epigastric pain: Secondary | ICD-10-CM | POA: Diagnosis not present

## 2016-06-29 DIAGNOSIS — R1310 Dysphagia, unspecified: Secondary | ICD-10-CM

## 2016-06-29 MED ORDER — SODIUM CHLORIDE 0.9 % IV SOLN: 500.0000 mL | INTRAVENOUS | Status: DC

## 2016-06-29 NOTE — Progress Notes (Signed)
A and O x3. Report to RN. Tolerated MAC anesthesia well.Teeth unchanged after procedure.

## 2016-06-29 NOTE — Op Note (Signed)
[place] Patient Name: Amy Barry Procedure Date: 06/29/2016 2:03 PM MRN: [id number] Endoscopist: Docia Chuck. Henrene Pastor , MD Age: 46 Referring MD:  Date of Birth: 1970-11-24 Gender: Female Account #: [id number] Procedure:                Upper GI endoscopy Indications:              Odynophagia, Esophageal reflux Medicines:                Monitored Anesthesia Care Procedure:                Pre-Anesthesia Assessment:                           - Prior to the procedure, a History and Physical                            was performed, and patient medications and                            allergies were reviewed. The patient's tolerance of                            previous anesthesia was also reviewed. The risks                            and benefits of the procedure and the sedation                            options and risks were discussed with the patient.                            All questions were answered, and informed consent                            was obtained. Prior Anticoagulants: The patient has                            taken no previous anticoagulant or antiplatelet                            agents. ASA Grade Assessment: II - A patient with                            mild systemic disease. After reviewing the risks                            and benefits, the patient was deemed in                            satisfactory condition to undergo the procedure.                           After obtaining informed consent, the endoscope was  passed under direct vision. Throughout the                            procedure, the patient's blood pressure, pulse, and                            oxygen saturations were monitored continuously. The                            Endoscope was introduced through the mouth, and                            advanced to the second part of duodenum. The upper                            GI endoscopy was accomplished  without difficulty.                            The patient tolerated the procedure well. Scope In: Scope Out: Findings:                 The esophagus was normal.                           The stomach was normal.                           The examined duodenum was normal.                           The cardia and gastric fundus were normal on                            retroflexion. Complications:            No immediate complications. Estimated Blood Loss:     Estimated blood loss: none. Impression:               - Normal esophagus.                           - Normal stomach.                           - Normal examined duodenum.                           - No specimens collected. Recommendation:           - Patient has a contact number available for                            emergencies. The signs and symptoms of potential                            delayed complications were discussed with the  patient. Return to normal activities tomorrow.                            Written discharge instructions were provided to the                            patient.                           - Resume previous diet.                           - Continue present medications.                           - GI follow up prn Docia Chuck. Henrene Pastor, MD 06/29/2016 2:21:58 PM This report has been signed electronically.

## 2016-06-29 NOTE — Patient Instructions (Signed)
YOU HAD AN ENDOSCOPIC PROCEDURE TODAY: Refer to the procedure report and other information in the discharge instructions given to you for any specific questions about what was found during the examination. If this information does not answer your questions, please call [place] office at [phone] to clarify.   YOU SHOULD EXPECT: Some feelings of bloating in the abdomen. Passage of more gas than usual. Walking can help get rid of the air that was put into your GI tract during the procedure and reduce the bloating. If you had a lower endoscopy (such as a colonoscopy or flexible sigmoidoscopy) you may notice spotting of blood in your stool or on the toilet paper. Some abdominal soreness may be present for a day or two, also.  DIET: Your first meal following the procedure should be a light meal and then it is ok to progress to your normal diet. A half-sandwich or bowl of soup is an example of a good first meal. Heavy or fried foods are harder to digest and may make you feel nauseous or bloated. Drink plenty of fluids but you should avoid alcoholic beverages for 24 hours. If you had a esophageal dilation, please see attached instructions for diet.    ACTIVITY: Your care partner should take you home directly after the procedure. You should plan to take it easy, moving slowly for the rest of the day. You can resume normal activity the day after the procedure however YOU SHOULD NOT DRIVE, use power tools, machinery or perform tasks that involve climbing or major physical exertion for 24 hours (because of the sedation medicines used during the test).   SYMPTOMS TO REPORT IMMEDIATELY: A gastroenterologist can be reached at any hour. Please call [phone]  for any of the following symptoms:   Following upper endoscopy (EGD, EUS, ERCP, esophageal dilation) Vomiting of blood or coffee ground material  New, significant abdominal pain  New, significant chest pain or pain under the shoulder blades  Painful or  persistently difficult swallowing  New shortness of breath  Black, tarry-looking or red, bloody stools  FOLLOW UP:  If any biopsies were taken you will be contacted by phone or by letter within the next 1-3 weeks. Call [phone]  if you have not heard about the biopsies in 3 weeks.  Please also call with any specific questions about appointments or follow up tests.  

## 2016-06-30 ENCOUNTER — Telehealth: Payer: Self-pay | Admitting: *Deleted

## 2016-06-30 NOTE — Telephone Encounter (Signed)
  Follow up Call-  Call back number 06/29/2016  Post procedure Call Back phone  # [phone]  Permission to leave phone message Yes  Some recent data might be hidden     Patient questions:  Do you have a fever, pain , or abdominal swelling? No. Pain Score  0 *  Have you tolerated food without any problems? Yes.    Have you been able to return to your normal activities? Yes.    Do you have any questions about your discharge instructions: Diet   No. Medications  No. Follow up visit  No.  Do you have questions or concerns about your Care? No.  Actions: * If pain score is 4 or above: No action needed, pain <4. Patient stating the right sie  Of her mouth is a little swollen with bruising on right corner. Advised to apply ice and call if needed.

## 2016-07-11 DIAGNOSIS — R102 Pelvic and perineal pain: Secondary | ICD-10-CM | POA: Diagnosis not present

## 2016-07-11 DIAGNOSIS — N921 Excessive and frequent menstruation with irregular cycle: Secondary | ICD-10-CM | POA: Diagnosis not present

## 2016-07-21 DIAGNOSIS — R5383 Other fatigue: Secondary | ICD-10-CM | POA: Diagnosis not present

## 2016-07-21 DIAGNOSIS — E559 Vitamin D deficiency, unspecified: Secondary | ICD-10-CM | POA: Diagnosis not present

## 2016-07-21 DIAGNOSIS — M791 Myalgia: Secondary | ICD-10-CM | POA: Diagnosis not present

## 2016-07-21 DIAGNOSIS — D509 Iron deficiency anemia, unspecified: Secondary | ICD-10-CM | POA: Diagnosis not present

## 2016-08-01 ENCOUNTER — Other Ambulatory Visit: Payer: Self-pay | Admitting: Obstetrics and Gynecology

## 2016-08-01 DIAGNOSIS — N921 Excessive and frequent menstruation with irregular cycle: Secondary | ICD-10-CM | POA: Diagnosis not present

## 2016-08-03 ENCOUNTER — Ambulatory Visit (HOSPITAL_COMMUNITY)
Admission: RE | Admit: 2016-08-03 | Discharge: 2016-08-03 | Disposition: A | Discharge status: Home / Self Care | Payer: 59 | Source: Ambulatory Visit | Attending: Neurology | Admitting: Neurology

## 2016-08-03 ENCOUNTER — Encounter: Payer: Self-pay | Admitting: Neurology

## 2016-08-03 ENCOUNTER — Telehealth: Payer: Self-pay | Admitting: Neurology

## 2016-08-03 ENCOUNTER — Encounter (INDEPENDENT_AMBULATORY_CARE_PROVIDER_SITE_OTHER): Payer: Self-pay

## 2016-08-03 ENCOUNTER — Ambulatory Visit (INDEPENDENT_AMBULATORY_CARE_PROVIDER_SITE_OTHER): Payer: 59 | Admitting: Neurology

## 2016-08-03 ENCOUNTER — Emergency Department (HOSPITAL_COMMUNITY)
Admission: EM | Admit: 2016-08-03 | Discharge: 2016-08-04 | Disposition: A | Discharge status: Home / Self Care | Payer: 59 | Attending: Emergency Medicine | Admitting: Emergency Medicine

## 2016-08-03 VITALS — BP 143/91 | HR 94 | Ht 61.5 in | Wt 162.0 lb

## 2016-08-03 DIAGNOSIS — M50221 Other cervical disc displacement at C4-C5 level: Secondary | ICD-10-CM

## 2016-08-03 DIAGNOSIS — M4802 Spinal stenosis, cervical region: Secondary | ICD-10-CM

## 2016-08-03 DIAGNOSIS — I10 Essential (primary) hypertension: Secondary | ICD-10-CM | POA: Diagnosis not present

## 2016-08-03 DIAGNOSIS — R200 Anesthesia of skin: Secondary | ICD-10-CM | POA: Diagnosis not present

## 2016-08-03 DIAGNOSIS — R531 Weakness: Secondary | ICD-10-CM

## 2016-08-03 DIAGNOSIS — R937 Abnormal findings on diagnostic imaging of other parts of musculoskeletal system: Secondary | ICD-10-CM | POA: Diagnosis not present

## 2016-08-03 DIAGNOSIS — R269 Unspecified abnormalities of gait and mobility: Secondary | ICD-10-CM | POA: Insufficient documentation

## 2016-08-03 DIAGNOSIS — G959 Disease of spinal cord, unspecified: Secondary | ICD-10-CM

## 2016-08-03 DIAGNOSIS — R202 Paresthesia of skin: Secondary | ICD-10-CM | POA: Insufficient documentation

## 2016-08-03 DIAGNOSIS — M50222 Other cervical disc displacement at C5-C6 level: Secondary | ICD-10-CM | POA: Diagnosis not present

## 2016-08-03 DIAGNOSIS — Z79899 Other long term (current) drug therapy: Secondary | ICD-10-CM | POA: Insufficient documentation

## 2016-08-03 DIAGNOSIS — Z981 Arthrodesis status: Secondary | ICD-10-CM

## 2016-08-03 DIAGNOSIS — M542 Cervicalgia: Secondary | ICD-10-CM

## 2016-08-03 DIAGNOSIS — R5382 Chronic fatigue, unspecified: Secondary | ICD-10-CM | POA: Insufficient documentation

## 2016-08-03 NOTE — Telephone Encounter (Signed)
I received phone call from neuroradiologist Dr. Jeannine Boga concerning her MRI cervical spine report,  There was a question of posterior migration of C6-7 implant with apparent moderate canal stenosis with cord flattening.  But evaluation of the exact localization of the implant is difficulty due to artifact.  CT cervical spine is suggested for exact anatomic localization.  I have called patient and her husband, send them back to [facility] emergency room for stat CT cervical spine, order was placed.  I also talked with neurosurgeon on call Dr. Saintclair Halsted to review her films; and ED on call Dr. Ellender Hose to inform patient is coming to ED for stat CT cervical and neurosurgical evaluation.  MRI cervical: IMPRESSION: 1. Status post arthrodesis at C6-7 with question of posterior migration of the C6-7 implant as above. Exact localization of the implant is somewhat difficult on this exam due to susceptibility artifact. Apparent mild to moderate canal stenosis with mild cord flattening without definite cord signal changes. Correlation with CT suggested for exact anatomic localization. 2. 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 Critical Value/emergent results were called by telephone at the time of interpretation on 08/03/2016 at 10:38 pm to Dr. Marcial Pacas , who verbally acknowledged these results.   Electronically Signed   By: Jeannine Boga M.D.   On: 08/03/2016 22:54

## 2016-08-03 NOTE — Progress Notes (Signed)
PATIENT: Amy Barry DOB: 12/15/1970  Chief Complaint  Patient presents with  . Extremity Weakness    She is here with her husband, Amy Barry.  She started having extremity weakness 1-2 months ago that has progressively worsened.  She is also having numbness and tingling throughout her body.  Reports her fatigue is to the point of causing her difficulty completing her daily ADLs.  She has mild, temporary relief with ten days of Prednisone.  Marland Kitchen PCP    Amy Prose, MD (Amy Drivers, PA - same office)     HISTORICAL  Amy Barry 46 years old right-handed female, accompanied by her husband, seen in refer by her primary care physician Amy Barry for evaluation of gait abnormality, initial evaluation was on Aug 03 2016.  I reviewed and summarized the referring note, she had a history of hypertension, acid reflux disease, anemia, I also reviewed operation record by Dr. 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  I personally reviewed MRI cervical in June 2017, cervical spondylosis, degenerative disc disease, moderate to prominent impingement at C6-7, left side disc protrusion, causing left foraminal impingement.  CT head without contrast in 2014, there was no acute abnormality.  She reported gradual onset gait abnormality since February 2018, initial symptoms were extreme fatigue, she also complains of right leg numbness weakness, right leg give out underneath her while ambulating, she was initially evaluated by her primary care physician in March 2018, had extensive laboratory evaluations, was put on vitamin D supplement, iron supplement without improving her symptoms.  Early April 2018, she noticed  right first 3 toes numbness, quickly spreading to whole right lower extremity in 2- 3 days, then followed by right arm weakness, she also complains of heaviness throughout her body, left leg weakness, bilateral upper extremity weakness, has mild residual left arm numbness should which she attributed to previous cervical radiculopathy, she also noticed mild right chin numbness,  She was diagnosed with increased intracranial pressure, mild change in her peripheral visual field, she denies dysarthria, no dysphagia, she denies incontinence, has chronic urinary urgency there was no significant change.  I reviewed the laboratory evaluation in April 2018, vitamin D level was 30 6.5, low iron 41, iron saturation 12, hemoglobin 12 point 1, CMV IgG antibody was elevated 5.9, ESR was elevated at 42, negative ANA, C-reactive protein was normal 4.3, EB virus antibody IgG was elevated more than 600, EB virus early antigen antibody was also elevated to 11 point 1, EB virus nuclear antigen antibody IgG was elevated 250, parvovirus B19 IgG antibody was elevated 5.2, vitamin B12 was normal 256, normal TSH 1.86, LDL was mildly elevated 114, cholesterol was 180, normal CMP, with creatinine of 0.64, UA showed no significant abnormality  REVIEW OF SYSTEMS: Full 14 system review of systems performed and notable only for fatigue,  ALLERGIES: Allergies  Allergen Reactions  . Metoclopramide Hcl Other (See Comments)    Shakiness   . Sumatriptan Other (See Comments)    High blood pressure  . Demerol [Meperidine] Nausea And Vomiting  . Tramadol Nausea Only  . Codeine Palpitations  . Meperidine Hcl Nausea And Vomiting  . Oxycodone-Acetaminophen Other (See Comments)    Drowsy  . Propoxyphene N-Acetaminophen Nausea  And Vomiting    HOME MEDICATIONS: Current Outpatient Prescriptions  Medication Sig Dispense Refill  . amLODipine (NORVASC) 10 MG tablet TAKE ONE TABLET BY MOUTH EVERY MORNING (Patient taking differently: TAKE  10 MG BY MOUTH EVERY MORNING) 90 tablet 3  . Cholecalciferol (VITAMIN D3 PO) Take 4,000 Units by mouth daily.     Marland Kitchen FERROCITE 324 MG TABS tablet Take 1 tablet by mouth daily.  5  . furosemide (LASIX) 40 MG tablet Take 1 tablet (40 mg total) by mouth daily. 90 tablet 3  . metoprolol (LOPRESSOR) 100 MG tablet TAKE ONE TABLET BY MOUTH TWICE DAILY (Patient taking differently: TAKE 100 MG BY MOUTH TWICE DAILY) 180 tablet 3  . Multiple Vitamin (MULTIVITAMIN WITH MINERALS) TABS Take 1 tablet by mouth every morning.    Marland Kitchen omeprazole (PRILOSEC) 40 MG capsule Take 1 capsule (40 mg total) by mouth 2 (two) times daily. 60 capsule 3  . ondansetron (ZOFRAN) 4 MG tablet Take 1 tablet (4 mg total) by mouth every 8 (eight) hours as needed for nausea or vomiting. 20 tablet 0  . potassium chloride (K-DUR) 10 MEQ tablet TAKE ONE TABLET BY MOUTH ONCE DAILY (Patient taking differently: TAKE 10 MEQ BY MOUTH ONCE DAILY) 90 tablet 3   No current facility-administered medications for this visit.     PAST MEDICAL HISTORY: Past Medical History:  Diagnosis Date  . Anemia   . Dysrhythmia    pvc's holter monitor started lopressor- no return visit  . GERD (gastroesophageal reflux disease)   . Headache(784.0)   . Heart murmur    child no echo  . History of kidney stones   . Hypertension   . IBS (irritable bowel syndrome)   . Low vitamin D level   . Lower extremity weakness   . Neuromuscular disorder ([place])   . Numbness and tingling   . Osteoarthritis   . Pneumonia    January- tx with IV antibiotics  for pneumonia in 04/2015   . PVC (premature ventricular contraction)   . Dyskinesia following Demerol treatment while we    . Sleep concern 2013   toldf that she doesn 't have apnea but she stays sleeping a lot  . Upper extremity weakness     PAST SURGICAL HISTORY: Past Surgical History:  Procedure Laterality Date  . 2 ovarian cysts     age 75  . CERVICAL DISC ARTHROPLASTY N/A 10/23/2015   Procedure: Cervical  Disc Arthroplasty Cervical six-seven;  Surgeon: Amy Moore, MD;  Location: [facility];  Service: Neurosurgery;  Laterality: N/A;  . ESOPHAGOGASTRODUODENOSCOPY     for GERD  . LAPAROSCOPIC ABDOMINAL EXPLORATION     dx ibs  . TUBAL LIGATION      FAMILY HISTORY: Family History  Problem Relation Age of Onset  . Arthritis    . Hyperlipidemia    . Hypertension Daughter   . Colon polyps Daughter 9    hermatoma oversized polyps-benign  . Stroke    . Leukemia Maternal Grandmother   . Heart disease Maternal Grandmother   . Asthma Sister   . Hypertension Mother   . Atrial fibrillation Mother   . Sarcoidosis Mother   . Healthy Father   . Colon cancer Neg Hx     SOCIAL HISTORY:  Social History   Social History  . Marital status: Married    Spouse name: N/A  . Number of children: 2  . Years of education: College   Occupational History  . [employer]  Social History Main Topics  . Smoking status: Never Smoker  . Smokeless tobacco: Never Used  . Alcohol use No  . Drug use: No  . Sexual activity: Not on file   Other Topics Concern  . Not on file   Social History Narrative   Lives at home with husband.   Right-handed.   Occasional caffeine use.     PHYSICAL EXAM   Vitals:   08/03/16 0953  BP: (!) 143/91  Pulse: 94  Weight: 162 lb (73.5 kg)  Height: 5' 1.5" (1.562 m)    Not recorded      Body mass index is 30.11 kg/m.  PHYSICAL EXAMNIATION:  Gen: NAD, conversant, well nourised, obese, well groomed                     Cardiovascular: Regular rate rhythm, no peripheral edema, warm, nontender. Eyes: Conjunctivae clear without exudates or hemorrhage Neck: Supple, no carotid bruits. Pulmonary: Clear to auscultation bilaterally   NEUROLOGICAL EXAM:Depressed looking middle-age female in wheelchair  MENTAL STATUS: Speech:    Speech is normal; fluent and spontaneous with normal comprehension.  Cognition:     Orientation to time, place and  person     Normal recent and remote memory     Normal Attention span and concentration     Normal Language, naming, repeating,spontaneous speech     Fund of knowledge   CRANIAL NERVES: CN II: Visual fields are full to confrontation. Fundoscopic exam is normal with sharp discs and no vascular changes. Pupils are round equal and briskly reactive to light. CN III, IV, VI: extraocular movement are normal. No ptosis. CN V: Facial sensation is intact to pinprick in all 3 divisions bilaterally. Corneal responses are intact.  CN VII: Face is symmetric with normal eye closure and smile. CN VIII: Hearing is normal to rubbing fingers CN IX, X: Palate elevates symmetrically. Phonation is normal. CN XI: Head turning and shoulder shrug are intact CN XII: Tongue is midline with normal movements and no atrophy.  MOTOR: There is no pronator drift of out-stretched arms. Muscle bulk and tone are normal.  I was not able to appreciate significant muscle weakness on examinations  REFLEXES: Reflexes  are 3 and symmetric at the biceps, triceps, knees, and ankles. Plantar responses are flexor.  SENSORY: Intact to light touch, pinprick, positional sensation and vibratory sensation are intact in fingers and toes.  COORDINATION: Rapid alternating movements and fine finger movements are intact. There is no dysmetria on finger-to-nose and heel-knee-shin.    GAIT/STANCE: She needs help to get up from seated position, wide based, unsteady,   DIAGNOSTIC DATA (LABS, IMAGING, TESTING) - I reviewed patient records, labs, notes, testing and imaging myself where available.   ASSESSMENT AND PLAN  MARGARETANN ABATE is a 46 y.o. female   subacute onset bilateral lower extremity and upper extremity weakness, paresthesia,  Profound gait abnormality on examinations, hyperreflexia  Localized to central nervous system, left hemisphere versus cervical spine  Proceed with MRI of the brain, cervical spine stat  We will see  patient after MRIs   Marcial Pacas, M.D. Ph.D.  [facility] [address], [place], [place] [address] Ph: [phone] Fax: [phone]  CC: Amy Prose, MD

## 2016-08-04 ENCOUNTER — Emergency Department (HOSPITAL_COMMUNITY): Payer: 59

## 2016-08-04 ENCOUNTER — Telehealth: Payer: Self-pay | Admitting: Neurology

## 2016-08-04 ENCOUNTER — Encounter (HOSPITAL_COMMUNITY): Payer: Self-pay

## 2016-08-04 DIAGNOSIS — M50222 Other cervical disc displacement at C5-C6 level: Secondary | ICD-10-CM | POA: Diagnosis not present

## 2016-08-04 DIAGNOSIS — R269 Unspecified abnormalities of gait and mobility: Secondary | ICD-10-CM

## 2016-08-04 LAB — BASIC METABOLIC PANEL
Anion gap: 9 (ref 5–15)
BUN: 11 mg/dL (ref 6–20)
CHLORIDE: 106 mmol/L (ref 101–111)
CO2: 24 mmol/L (ref 22–32)
Calcium: 9 mg/dL (ref 8.9–10.3)
Creatinine, Ser: 0.85 mg/dL (ref 0.44–1.00)
GFR calc non Af Amer: >60 mL/min (ref >60)
Glucose, Bld: 107 mg/dL — ABNORMAL HIGH (ref 65–99)
Potassium: 3.7 mmol/L (ref 3.5–5.1)
Sodium: 139 mmol/L (ref 135–145)

## 2016-08-04 LAB — CBC
HEMATOCRIT: 36.7 % (ref 36.0–46.0)
HEMOGLOBIN: 11.9 g/dL — AB (ref 12.0–15.0)
MCH: 29.6 pg (ref 26.0–34.0)
MCHC: 32.4 g/dL (ref 30.0–36.0)
MCV: 91.3 fL (ref 78.0–100.0)
Platelets: 310 10*3/uL (ref 150–400)
RBC: 4.02 MIL/uL (ref 3.87–5.11)
RDW: 15.3 % (ref 11.5–15.5)
WBC: 9.8 10*3/uL (ref 4.0–10.5)

## 2016-08-04 MED ORDER — MORPHINE SULFATE (PF) 4 MG/ML IV SOLN
4.0000 mg | Freq: Once | INTRAVENOUS | Status: AC
  Administered 2016-08-04: 4 mg via INTRAVENOUS
  Filled 2016-08-04: qty 1

## 2016-08-04 NOTE — ED Triage Notes (Signed)
Pt was sent here from [facility] to have a CT scan, pt had MRI done at [facility], having R sided pain and headache.

## 2016-08-04 NOTE — Telephone Encounter (Signed)
Patient was discharged from the hospital and has a pending follow up with [name] on 08/05/16.

## 2016-08-04 NOTE — Telephone Encounter (Signed)
Please call patient, give her a follow-up appointment,

## 2016-08-04 NOTE — Discharge Instructions (Signed)
Please read and follow all provided instructions.  Your diagnoses today include:  1. Cervical spine pain   2. Abnormal MRI, cervical spine    Tests performed today include: Vital signs. See below for your results today.   Medications prescribed:  Take as prescribed   Home care instructions:  Follow any educational materials contained in this packet.  Follow-up instructions: Please follow-up with your neurologst for further evaluation of symptoms and treatment   Return instructions:  Please return to the Emergency Department if you do not get better, if you get worse, or new symptoms OR  - Fever (temperature greater than 101.34F)  - Bleeding that does not stop with holding pressure to the area    -Severe pain (please note that you may be more sore the day after your accident)  - Chest Pain  - Difficulty breathing  - Severe nausea or vomiting  - Inability to tolerate food and liquids  - Passing out  - Skin becoming red around your wounds  - Change in mental status (confusion or lethargy)  - New numbness or weakness    Please return if you have any other emergent concerns.  Additional Information:  Your vital signs today were: BP 136/79    Pulse 77    Temp 98.8 F (37.1 C) (Oral)    Resp 16    SpO2 99%  If your blood pressure (BP) was elevated above 135/85 this visit, please have this repeated by your doctor within one month. --------------

## 2016-08-04 NOTE — ED Provider Notes (Signed)
[facility] DEPT Provider Note   CSN: [id number] Arrival date & time: 08/03/16  2357     History   Chief Complaint No chief complaint on file.   HPI Amy Barry is a 46 y.o. female.  HPI  46 y.o. female with a hx of HTN, presents to the Emergency Department today per neurologst request ([name]) due to findings from MRI study done today. Noted residual left arm numbness from previous cervical radiculopathy. Pt states that she has been getting progressively weaker that past week on her BLE as well as right arm. Notes seeing her neurologist for the same and having MRI brain and C spine done. MRI report of cervical spine questioned posterior migration of C6-C7 implant with apparent moderate canal stenosis with cord flattening. Requested ED evaluation with CT Cervical spine to show exact anatomic localization. Pt denies CP/SOB/ABD pain. No fevers. No URi symptoms. Does endorse headache on right side intermittently. No nuchal rigidity, but does endorse cervical spine discomfort. Of note, pt had cervical spine disc arthroplasty by Dr. Ronnald Ramp on 10-23-15 and has had no complications since then. No other symptoms noted.   MRI cervical: IMPRESSION: 1. Status post arthrodesis at C6-7 with question of posterior migration of the C6-7 implant as above. Exact localization of the implant is somewhat difficult on this exam due to susceptibility artifact. Apparent mild to moderate canal stenosis with mild cord flattening without definite cord signal changes. Correlation with CT suggested for exact anatomic localization. 2. 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 Critical Value/emergent results were called by telephone at the time of interpretation on 08/03/2016 at 10:38 pm to Dr. Marcial Pacas , who verbally acknowledged these results  Past Medical History:  Diagnosis  Date  . Anemia   . Dysrhythmia    pvc's holter monitor started lopressor- no return visit  . GERD (gastroesophageal reflux disease)   . Headache(784.0)   . Heart murmur    child no echo  . History of kidney stones   . Hypertension   . IBS (irritable bowel syndrome)   . Low vitamin D level   . Lower extremity weakness   . Neuromuscular disorder ([place])   . Numbness and tingling   . Osteoarthritis   . Pneumonia    January- tx with IV antibiotics  for pneumonia in 04/2015   . PVC (premature ventricular contraction)   . Seizure disorder ([place])    denied seizures- has dyskinesia  . Sleep concern 2013   toldf that she doesn 't have apnea but she stays sleeping a lot  . Upper extremity weakness     Patient Active Problem List   Diagnosis Date Noted  . Gait abnormality 08/03/2016  . Weakness 08/03/2016  . Paresthesia 08/03/2016  . S/P cervical spinal fusion 10/23/2015  . Hypersomnia 12/08/2010  . 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  . EPIGASTRIC PAIN 10/13/2009  . IRRITABLE BOWEL SYNDROME 03/10/2009  . BACK PAIN, LUMBAR 07/01/2008  . COSTOCHONDRITIS 02/01/2008  . VIRAL URI 01/08/2008  . CHEST PAIN 01/08/2008  . 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 ARM PAIN 12/25/2007  . ANEMIA-NOS 10/02/2007  . Essential hypertension 10/02/2007  . GERD 10/02/2007  . POLYP, GALLBLADDER 10/02/2007  . UTERINE POLYP 10/02/2007  . OSTEOARTHRITIS 10/02/2007  . Dyskinesia 10/02/2007  . HEADACHE 10/02/2007  . PALPITATIONS 10/02/2007  . CARDIAC MURMUR, HX OF 10/02/2007  . NEPHROLITHIASIS, HX  OF 10/02/2007    Past Surgical History:  Procedure Laterality Date  . 2 ovarian cysts     age 3  . CERVICAL DISC ARTHROPLASTY N/A 10/23/2015   Procedure: Cervical Disc Arthroplasty Cervical six-seven;  Surgeon: Eustace Moore, MD;  Location: [facility];  Service: Neurosurgery;  Laterality: N/A;  . ESOPHAGOGASTRODUODENOSCOPY     for GERD  . LAPAROSCOPIC ABDOMINAL EXPLORATION     dx ibs  .  TUBAL LIGATION      OB History    No data available       Home Medications    Prior to Admission medications   Medication Sig Start Date End Date Taking? Authorizing Provider  amLODipine (NORVASC) 10 MG tablet TAKE ONE TABLET BY MOUTH EVERY MORNING Patient taking differently: TAKE 10 MG BY MOUTH EVERY MORNING 12/31/14   Laurey Morale, MD  Cholecalciferol (VITAMIN D3 PO) Take 4,000 Units by mouth daily.     Historical Provider, MD  FERROCITE 324 MG TABS tablet Take 1 tablet by mouth daily. 06/08/16   Historical Provider, MD  furosemide (LASIX) 40 MG tablet Take 1 tablet (40 mg total) by mouth daily. 08/21/15   Laurey Morale, MD  metoprolol (LOPRESSOR) 100 MG tablet TAKE ONE TABLET BY MOUTH TWICE DAILY Patient taking differently: TAKE 100 MG BY MOUTH TWICE DAILY 12/31/14   Laurey Morale, MD  Multiple Vitamin (MULTIVITAMIN WITH MINERALS) TABS Take 1 tablet by mouth every morning.    Historical Provider, MD  omeprazole (PRILOSEC) 40 MG capsule Take 1 capsule (40 mg total) by mouth 2 (two) times daily. 05/09/16   Laurey Morale, MD  ondansetron (ZOFRAN) 4 MG tablet Take 1 tablet (4 mg total) by mouth every 8 (eight) hours as needed for nausea or vomiting. 02/19/16   Dorothyann Peng, NP  potassium chloride (K-DUR) 10 MEQ tablet TAKE ONE TABLET BY MOUTH ONCE DAILY Patient taking differently: TAKE 10 MEQ BY MOUTH ONCE DAILY 12/31/14   Laurey Morale, MD    Family History Family History  Problem Relation Age of Onset  . Arthritis    . Hyperlipidemia    . Hypertension Daughter   . Colon polyps Daughter 9    hermatoma oversized polyps-benign  . Stroke    . Leukemia Maternal Grandmother   . Heart disease Maternal Grandmother   . Asthma Sister   . Hypertension Mother   . Atrial fibrillation Mother   . Sarcoidosis Mother   . Healthy Father   . Colon cancer Neg Hx     Social History Social History  Substance Use Topics  . Smoking status: Never Smoker  . Smokeless tobacco: Never Used  .  Alcohol use No     Allergies   Metoclopramide hcl; Sumatriptan; Demerol [meperidine]; Tramadol; Codeine; Meperidine hcl; Oxycodone-acetaminophen; and Propoxyphene n-acetaminophen   Review of Systems Review of Systems ROS reviewed and all are negative for acute change except as noted in the HPI.  Physical Exam Updated Vital Signs There were no vitals taken for this visit.  Physical Exam  Constitutional: She is oriented to person, place, and time. Vital signs are normal. She appears well-developed and well-nourished.  HENT:  Head: Normocephalic and atraumatic.  Right Ear: Hearing normal.  Left Ear: Hearing normal.  C spine TTP along C6. No palpable or visible deformities. ROM intact.   Eyes: Conjunctivae and EOM are normal. Pupils are equal, round, and reactive to light.  Neck: Normal range of motion. Neck supple.  Cardiovascular: Normal rate,  regular rhythm, normal heart sounds and intact distal pulses.   Pulmonary/Chest: Effort normal and breath sounds normal.  Abdominal: Soft. Bowel sounds are normal.  Musculoskeletal: Normal range of motion.  Neurological: She is alert and oriented to person, place, and time. She has normal strength. No cranial nerve deficit.  BUE/BLE motor/grip strengths equal. BLE sensation to dull/sharp L>R.  Skin: Skin is warm and dry.  Psychiatric: She has a normal mood and affect. Her speech is normal and behavior is normal. Thought content normal.  Nursing note and vitals reviewed.    ED Treatments / Results  Labs (all labs ordered are listed, but only abnormal results are displayed) Labs Reviewed  CBC - Abnormal; Notable for the following:       Result Value   Hemoglobin 11.9 (*)    All other components within normal limits  BASIC METABOLIC PANEL - Abnormal; Notable for the following:    Glucose, Bld 107 (*)    All other components within normal limits    EKG  EKG Interpretation None       Radiology Ct Cervical Spine Wo  Contrast  Result Date: 08/04/2016 CLINICAL DATA:  Follow-up examination for arm previous MRI, possible displacement of vertebral arthrodesis. EXAM: CT CERVICAL SPINE WITHOUT CONTRAST TECHNIQUE: Multidetector CT imaging of the cervical spine was performed without intravenous contrast. Multiplanar CT image reconstructions were also generated. COMPARISON:  Comparison made with previous MRI from 08/03/2016. FINDINGS: Alignment: Straightening with slight reversal of the normal cervical lordosis. No listhesis or malalignment. Skull base and vertebrae: Skullbase intact. Normal C1-2 articulations are well preserved. Dens is intact. Vertebral body heights are well maintained. No evidence for acute or chronic fracture. Patient is status post arthrodesis at C6-7. On current exam, the hardware appears fairly well positioned, with only minimal 1-2 mm posterior extension outside the confines of the parent C6-7 interspace. The spinal canal this level it is widely patent measuring approximately 11 mm in AP diameter. No complication identified. Soft tissues and spinal canal: Paraspinous soft tissues demonstrate no acute abnormality. No abnormal prevertebral edema. Spinal canal within normal limits. Disc levels: Multifocal disc protrusions at C3-4, C4-5, and C5-6, better seen and characterized on recent MRI. Upper chest: Visualized upper chest within normal limits. Visualized lung apices are clear. No apical pneumothorax. Other: No other significant finding. IMPRESSION: 1. No acute abnormality within the cervical spine. 2. C6-7 arthrodesis in place, and appears well positioned on this exam, with minimal 2 mm extension posterior to the parent C6-7 interspace. No other complication identified. 3. Multilevel disc protrusions at C3-4 through C5-6, better evaluated on recent MRI. Electronically Signed   By: Jeannine Boga M.D.   On: 08/04/2016 02:23   Mr Brain Wo Contrast  Result Date: 08/03/2016 CLINICAL DATA:  46 y/o F;  numbness and weakness in both legs for 3-4 weeks. EXAM: MRI HEAD WITHOUT CONTRAST TECHNIQUE: Multiplanar, multiecho pulse sequences of the brain and surrounding structures were obtained without intravenous contrast. COMPARISON:  None. FINDINGS: Brain: No acute infarction, hemorrhage, hydrocephalus, extra-axial collection or mass lesion. Vascular: Normal flow voids. Skull and upper cervical spine: Normal marrow signal. Sinuses/Orbits: Negative. Other: None. IMPRESSION: No acute intracranial abnormality.  Unremarkable MRI of the brain. Electronically Signed   By: Kristine Garbe M.D.   On: 08/03/2016 22:18   Mr Cervical Spine Wo Contrast  Result Date: 08/03/2016 CLINICAL DATA:  Initial evaluation for numbness in weakness in both legs for 3-4 weeks. No known trauma or injury. EXAM: MRI CERVICAL SPINE WITHOUT  CONTRAST TECHNIQUE: Multiplanar, multisequence MR imaging of the cervical spine was performed. No intravenous contrast was administered. COMPARISON:  Comparison made with prior MRI from 09/12/2015. FINDINGS: Alignment: Straightening with mild reversal of the normal cervical lordosis. Trace anterolisthesis of C4 on C5, with trace retrolisthesis of C5 on C6. Vertebrae: Vertebral body heights are maintained. No evidence for acute or chronic fracture. Signal intensity within the vertebral body bone marrow is within normal limits. No focal osseous lesion. No abnormal marrow edema. Patient's status posts arthrodesis at C6-7. Susceptibility artifact from the implant obscures the C6-7 interspace. The implant appears to be somewhat displaced posteriorly, possibly protruding into the spinal canal (series 4, image 8). Exact localization limited by susceptibility artifact. There is flattening of the cervical spinal cord with moderate canal stenosis. Thecal sac measures approximately 7 mm in AP diameter. Cord: Signal intensity within the cervical spinal cord is within normal limits. Posterior Fossa, vertebral  arteries, paraspinal tissues: Visualized brain and posterior fossa are within normal limits. Craniocervical junction normal. Paraspinous and prevertebral soft tissues within normal limits. Normal intravascular flow voids present within the vertebral arteries bilaterally. Disc levels: C2-C3: Small central disc protrusion indents the ventral thecal sac without significant canal or foraminal stenosis, stable. C3-C4: Right paracentral disc protrusion indents the right ventral thecal sac. Protruding disc abuts the right ventral aspect of the cervical spinal cord. Mild cord flattening without cord signal changes. Moderate canal stenosis, relatively stable. No significant foraminal encroachment. C4-C5: Right central disc protrusion indents the right ventral thecal sac. Protruding disc abuts the right ventral cervical spinal cord. Mild cord flattening without cord signal changes. Moderate narrowing of the central canal. Changes relatively similar to previous. No significant foraminal encroachment. C5-C6: Small central disc protrusion indents the ventral thecal sac, slightly increased in size from previous. Protruding disc abuts the right ventral thecal sac. Secondary mild cord flattening without cord signal changes. Mild to moderate central canal stenosis. No significant foraminal encroachment. C6-C7: Status post arthrodesis. Again, there is question of posterior extrusion of the implant, which may be displaced posteriorly towards the ventral thecal sac. Exact localization limited by susceptibility artifact. Secondary mild to moderate canal narrowing with the thecal sac measuring approximately 7 mm in AP diameter. Mild flattening of the cervical spinal cord without cord signal changes. C7-T1:  Unremarkable. Visualized upper thoracic spine within normal limits. IMPRESSION: 1. Status post arthrodesis at C6-7 with question of posterior migration of the C6-7 implant as above. Exact localization of the implant is somewhat  difficult on this exam due to susceptibility artifact. Apparent mild to moderate canal stenosis with mild cord flattening without definite cord signal changes. Correlation with CT suggested for exact anatomic localization. 2. 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 Critical Value/emergent results were called by telephone at the time of interpretation on 08/03/2016 at 10:38 pm to Dr. Marcial Pacas , who verbally acknowledged these results. Electronically Signed   By: Jeannine Boga M.D.   On: 08/03/2016 22:54    Procedures Procedures (including critical care time)  Medications Ordered in ED Medications - No data to display   Initial Impression / Assessment and Plan / ED Course  I have reviewed the triage vital signs and the nursing notes.  Pertinent labs & imaging results that were available during my care of the patient were reviewed by me and considered in my medical decision making (see chart for  details).  Final Clinical Impressions(s) / ED Diagnoses  {I have reviewed and evaluated the relevant laboratory values. {I have reviewed and evaluated the relevant imaging studies.  {I have reviewed the relevant previous healthcare records.  {I obtained HPI from historian. {Patient discussed with supervising physician.  ED Course:  Assessment: Pt is a 46 y.o. female who presents with from neurologist for Cervical spine CT. Abnormality found on MR spine with questionable posterior migration of C6-C7 implant with apparent moderate canal stenosis with cord flattening. Pt notes hx of progressive BLE and right upper extremity weakness with increasing gait abnormalities. On exam, pt in NAD. Nontoxic/nonseptic appearing. VSS. Afebrile. Lungs CTA. Heart RRR. Abdomen nontender soft. BUE/BLE exam otherwise unremarkable except for sharp/dull sensation L>R. Labs obtained. CT Cervical  Spine without acute abnormalities. Will DC patient home with follow up to Neurology for further evaluation. Pt has appointment on Friday. At time of discharge, Patient is in no acute distress. Vital Signs are stable. Patient is able to ambulate. Patient able to tolerate PO.   Disposition/Plan:  DC Home Additional Verbal discharge instructions given and discussed with patient.  Pt Instructed to f/u with neurology in the next week for evaluation and treatment of symptoms. Return precautions given Pt acknowledges and agrees with plan  Supervising Physician Daleen Bo, MD  Final diagnoses:  Cervical spine pain  Abnormal MRI, cervical spine    New Prescriptions New Prescriptions   No medications on file     Shary Decamp, PA-C 08/04/16 0259    Daleen Bo, MD 08/04/16 [phone]

## 2016-08-05 ENCOUNTER — Ambulatory Visit (INDEPENDENT_AMBULATORY_CARE_PROVIDER_SITE_OTHER): Payer: 59 | Admitting: Neurology

## 2016-08-05 ENCOUNTER — Encounter: Payer: Self-pay | Admitting: Neurology

## 2016-08-05 VITALS — BP 149/87 | HR 105

## 2016-08-05 DIAGNOSIS — R531 Weakness: Secondary | ICD-10-CM

## 2016-08-05 DIAGNOSIS — R269 Unspecified abnormalities of gait and mobility: Secondary | ICD-10-CM

## 2016-08-05 DIAGNOSIS — R202 Paresthesia of skin: Secondary | ICD-10-CM | POA: Diagnosis not present

## 2016-08-05 DIAGNOSIS — Z981 Arthrodesis status: Secondary | ICD-10-CM

## 2016-08-05 NOTE — Progress Notes (Signed)
PATIENT: Amy Barry DOB: 10/22/1970  Chief Complaint  Patient presents with  . Extremity Weakness    She is here with her husband, [name], to discuss her MRI and CT results.     HISTORICAL  Amy Barry 46 years old right-handed female, accompanied by her husband, seen in refer by her primary care physician Donald Prose for evaluation of gait abnormality, initial evaluation was on Aug 03 2016.  I reviewed and summarized the referring note, she had a history of hypertension, acid reflux disease, anemia, I also reviewed operation record by Dr. 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  I personally reviewed MRI cervical in June 2017, cervical spondylosis, degenerative disc disease, moderate to prominent impingement at C6-7, left side disc protrusion, causing left foraminal impingement.  CT head without contrast in 2014, there was no acute abnormality.  She reported gradual onset gait abnormality since February 2018, initial symptoms were extreme fatigue, she also complains of right leg numbness weakness, right leg give out underneath her while ambulating, she was initially evaluated by her primary care physician in March 2018, had extensive laboratory evaluations, was put on vitamin D supplement, iron supplement without improving her symptoms.  Early April 2018, she noticed right first 3 toes numbness, quickly spreading to whole right lower extremity in 2- 3 days, then followed by right arm weakness, she also complains of heaviness throughout her body, left leg weakness, bilateral upper extremity weakness, has mild residual left arm numbness should which she attributed to previous cervical  radiculopathy, she also noticed mild right chin numbness,  She was diagnosed with increased intracranial pressure, mild change in her peripheral visual field, she denies dysarthria, no dysphagia, she denies incontinence, has chronic urinary urgency there was no significant change.  I reviewed the laboratory evaluation in April 2018, vitamin D level was 30 6.5, low iron 41, iron saturation 12, hemoglobin 12 point 1, CMV IgG antibody was elevated 5.9, ESR was elevated at 42, negative ANA, C-reactive protein was normal 4.3, EB virus antibody IgG was elevated more than 600, EB virus early antigen antibody was also elevated to 11 point 1, EB virus nuclear antigen antibody IgG was elevated 250, parvovirus B19 IgG antibody was elevated 5.2, vitamin B12 was normal 256, normal TSH 1.86, LDL was mildly elevated 114, cholesterol was 180, normal CMP, with creatinine of 0.64, UA showed no significant abnormality  Update Aug 05 2016: Accompanied by her husband at today's clinical visit,  we have reviewed MRI brain on Aug 03 2016, there was no significant abnormality. MRI of cervical spine showed C6-7 status post arthrodeses, CT cervical spine showed arthrodeses in place, well positioned. With minimum 2 mm extension posterior to the parents C6-7 interspace,    Patient continued to complains of worsening symptoms, this includes worsening gait abnormality, she also reported one episode of urinary urgency this morning. She also noticed bilateral upper extremity weakness, difficulty holding her head up, he denies bulbar symptoms no visual change,  I was able to talk with her previous neurosurgeon Dr. Sherley Bounds, who has already reviewed the film, per Dr. Ronnald Ramp, there was no significant findings on MRI cervical to explain her profound symptoms. She will have appointment with Dr. Shanon Brow  Jones in next few days.  REVIEW OF SYSTEMS: Full 14 system review of systems performed  saidand notable only for As  above,  ALLERGIES: Allergies  Allergen Reactions  . Metoclopramide Hcl Other (See Comments)    Shakiness   . Sumatriptan Other (See Comments)    High blood pressure  . Demerol [Meperidine] Nausea And Vomiting  . Tramadol Nausea Only  . Codeine Palpitations  . Meperidine Hcl Nausea And Vomiting  . Oxycodone-Acetaminophen Other (See Comments)    Drowsy  . Propoxyphene N-Acetaminophen Nausea And Vomiting    HOME MEDICATIONS: Current Outpatient Prescriptions  Medication Sig Dispense Refill  . amLODipine (NORVASC) 10 MG tablet TAKE ONE TABLET BY MOUTH EVERY MORNING (Patient taking differently: TAKE 10 MG BY MOUTH EVERY MORNING) 90 tablet 3  . Cholecalciferol (VITAMIN D3 PO) Take 4,000 Units by mouth daily.     Marland Kitchen FERROCITE 324 MG TABS tablet Take 1 tablet by mouth daily.  5  . furosemide (LASIX) 40 MG tablet Take 1 tablet (40 mg total) by mouth daily. 90 tablet 3  . metoprolol (LOPRESSOR) 100 MG tablet TAKE ONE TABLET BY MOUTH TWICE DAILY (Patient taking differently: TAKE 100 MG BY MOUTH TWICE DAILY) 180 tablet 3  . Multiple Vitamin (MULTIVITAMIN WITH MINERALS) TABS Take 1 tablet by mouth every morning.    Marland Kitchen omeprazole (PRILOSEC) 40 MG capsule Take 1 capsule (40 mg total) by mouth 2 (two) times daily. 60 capsule 3  . ondansetron (ZOFRAN) 4 MG tablet Take 1 tablet (4 mg total) by mouth every 8 (eight) hours as needed for nausea or vomiting. 20 tablet 0  . potassium chloride (K-DUR) 10 MEQ tablet TAKE ONE TABLET BY MOUTH ONCE DAILY (Patient taking differently: TAKE 10 MEQ BY MOUTH ONCE DAILY) 90 tablet 3   No current facility-administered medications for this visit.     PAST MEDICAL HISTORY: Past Medical History:  Diagnosis Date  . Anemia   . Dysrhythmia    pvc's holter monitor started lopressor- no return visit  . GERD (gastroesophageal reflux disease)   . Headache(784.0)   . Heart murmur    child no echo  . History of kidney stones   . Hypertension   . IBS (irritable bowel  syndrome)   . Low vitamin D level   . Lower extremity weakness   . Neuromuscular disorder ([place])   . Numbness and tingling   . Osteoarthritis   . Pneumonia    January- tx with IV antibiotics  for pneumonia in 04/2015   . PVC (premature ventricular contraction)   . Dyskinesia following Demerol treatment while we    . Sleep concern 2013   toldf that she doesn 't have apnea but she stays sleeping a lot  . Upper extremity weakness     PAST SURGICAL HISTORY: Past Surgical History:  Procedure Laterality Date  . 2 ovarian cysts     age 64  . CERVICAL DISC ARTHROPLASTY N/A 10/23/2015   Procedure: Cervical Disc Arthroplasty Cervical six-seven;  Surgeon: Eustace Moore, MD;  Location: [place] [facility];  Service: Neurosurgery;  Laterality: N/A;  . ESOPHAGOGASTRODUODENOSCOPY     for GERD  . LAPAROSCOPIC ABDOMINAL EXPLORATION     dx ibs  . TUBAL LIGATION      FAMILY HISTORY: Family History  Problem Relation Age of Onset  . Arthritis    . Hyperlipidemia    . Hypertension Daughter   . Colon polyps Daughter 9    hermatoma oversized polyps-benign  . Stroke    .  Leukemia Maternal Grandmother   . Heart disease Maternal Grandmother   . Asthma Sister   . Hypertension Mother   . Atrial fibrillation Mother   . Sarcoidosis Mother   . Healthy Father   . Colon cancer Neg Hx     SOCIAL HISTORY:  Social History   Social History  . Marital status: Married    Spouse name: N/A  . Number of children: 2  . Years of education: College   Occupational History  . RN [employer]   Social History Main Topics  . Smoking status: Never Smoker  . Smokeless tobacco: Never Used  . Alcohol use No  . Drug use: No  . Sexual activity: Not on file   Other Topics Concern  . Not on file   Social History Narrative   Lives at home with husband.   Right-handed.   Occasional caffeine use.     PHYSICAL EXAM   Vitals:   08/05/16 0855  BP: (!) 149/87  Pulse: (!) 105    Not recorded       There is no height or weight on file to calculate BMI.  PHYSICAL EXAMNIATION:  Gen: NAD, conversant, well nourised, obese, well groomed                     Cardiovascular: Regular rate rhythm, no peripheral edema, warm, nontender. Eyes: Conjunctivae clear without exudates or hemorrhage Neck: Supple, no carotid bruits. Pulmonary: Clear to auscultation bilaterally   NEUROLOGICAL EXAM:Depressed looking middle-age female in wheelchair  MENTAL STATUS: Speech:    Speech is normal; fluent and spontaneous with normal comprehension.  Cognition:     Orientation to time, place and person     Normal recent and remote memory     Normal Attention span and concentration     Normal Language, naming, repeating,spontaneous speech     Fund of knowledge   CRANIAL NERVES: CN II: Visual fields are full to confrontation. Fundoscopic exam is normal with sharp discs and no vascular changes. Pupils are round equal and briskly reactive to light. CN III, IV, VI: extraocular movement are normal. No ptosis. CN V: Facial sensation is intact to pinprick in all 3 divisions bilaterally. Corneal responses are intact.  CN VII: Face is symmetric with normal eye closure and smile. CN VIII: Hearing is normal to rubbing fingers CN IX, X: Palate elevates symmetrically. Phonation is normal. CN XI: Head turning and shoulder shrug are intact CN XII: Tongue is midline with normal movements and no atrophy.  MOTOR: There is no pronator drift of out-stretched arms. Muscle bulk and tone are normal.  I was not able to appreciate significant muscle weakness on examinations  REFLEXES: Reflexes  are 3 and symmetric at the biceps, triceps, knees, and ankles. Plantar responses are flexor.  SENSORY: Intact to light touch, pinprick, positional sensation and vibratory sensation are intact in fingers and toes.  COORDINATION: Rapid alternating movements and fine finger movements are intact. There is no dysmetria on  finger-to-nose and heel-knee-shin.    GAIT/STANCE: She needs help to get up from seated position, she was able to ambulate holding on her husband, she was able to maintain balance in awkward posture, tends to hold bilateral feet at the lateral edge, with plantar flexion.    DIAGNOSTIC DATA (LABS, IMAGING, TESTING) - I reviewed patient records, labs, notes, testing and imaging myself where available.   ASSESSMENT AND PLAN  QUINTAVIA ROGSTAD is a 46 y.o. female   subacute onset  bilateral lower extremity and upper extremity weakness, paresthesia,  hyperreflexia, previous evidence of C6-7 anterior decompression arthrodesis,   variable effort on examinations,  Potential localization to thoracic spine. Also need to rule out bilateral lumbar sacral radiculopathy.  Proceed with MRI of thoracic, MRI lumbar, EMG nerve conduction study  Laboratory evaluations to rule out treatable etiology  Referred to previous neurosurgeon Dr. Sherley Bounds for reevaluation.    Marcial Pacas, M.D. Ph.D.  [facility] [address], [place], [place] [address] Ph: [phone] Fax: [phone]  CC: Donald Prose, MD

## 2016-08-08 DIAGNOSIS — R531 Weakness: Secondary | ICD-10-CM | POA: Diagnosis not present

## 2016-08-08 DIAGNOSIS — R29898 Other symptoms and signs involving the musculoskeletal system: Secondary | ICD-10-CM | POA: Diagnosis not present

## 2016-08-08 DIAGNOSIS — M542 Cervicalgia: Secondary | ICD-10-CM | POA: Diagnosis not present

## 2016-08-08 DIAGNOSIS — I10 Essential (primary) hypertension: Secondary | ICD-10-CM | POA: Diagnosis not present

## 2016-08-08 DIAGNOSIS — K219 Gastro-esophageal reflux disease without esophagitis: Secondary | ICD-10-CM | POA: Diagnosis not present

## 2016-08-08 DIAGNOSIS — H5710 Ocular pain, unspecified eye: Secondary | ICD-10-CM | POA: Diagnosis not present

## 2016-08-08 DIAGNOSIS — M6281 Muscle weakness (generalized): Secondary | ICD-10-CM | POA: Diagnosis not present

## 2016-08-08 DIAGNOSIS — H538 Other visual disturbances: Secondary | ICD-10-CM | POA: Diagnosis not present

## 2016-08-09 ENCOUNTER — Telehealth: Payer: Self-pay | Admitting: *Deleted

## 2016-08-09 ENCOUNTER — Encounter: Payer: Self-pay | Admitting: Neurology

## 2016-08-09 DIAGNOSIS — R29898 Other symptoms and signs involving the musculoskeletal system: Secondary | ICD-10-CM | POA: Diagnosis not present

## 2016-08-09 DIAGNOSIS — D1809 Hemangioma of other sites: Secondary | ICD-10-CM | POA: Diagnosis not present

## 2016-08-09 DIAGNOSIS — I10 Essential (primary) hypertension: Secondary | ICD-10-CM | POA: Diagnosis not present

## 2016-08-09 DIAGNOSIS — M6281 Muscle weakness (generalized): Secondary | ICD-10-CM | POA: Diagnosis not present

## 2016-08-09 DIAGNOSIS — K219 Gastro-esophageal reflux disease without esophagitis: Secondary | ICD-10-CM | POA: Diagnosis not present

## 2016-08-09 NOTE — Telephone Encounter (Signed)
No showed NCV/EMG appointment. 

## 2016-08-10 ENCOUNTER — Ambulatory Visit (HOSPITAL_COMMUNITY): Admission: RE | Admit: 2016-08-10 | Payer: 59 | Source: Ambulatory Visit

## 2016-08-10 ENCOUNTER — Ambulatory Visit (HOSPITAL_COMMUNITY): Payer: 59

## 2016-08-10 ENCOUNTER — Encounter: Payer: Self-pay | Admitting: Neurology

## 2016-08-10 DIAGNOSIS — K219 Gastro-esophageal reflux disease without esophagitis: Secondary | ICD-10-CM | POA: Diagnosis not present

## 2016-08-10 DIAGNOSIS — R531 Weakness: Secondary | ICD-10-CM | POA: Diagnosis not present

## 2016-08-10 DIAGNOSIS — I10 Essential (primary) hypertension: Secondary | ICD-10-CM | POA: Diagnosis not present

## 2016-08-11 DIAGNOSIS — K219 Gastro-esophageal reflux disease without esophagitis: Secondary | ICD-10-CM | POA: Diagnosis not present

## 2016-08-11 DIAGNOSIS — I10 Essential (primary) hypertension: Secondary | ICD-10-CM | POA: Diagnosis not present

## 2016-08-11 DIAGNOSIS — R531 Weakness: Secondary | ICD-10-CM | POA: Diagnosis not present

## 2016-08-12 DIAGNOSIS — R531 Weakness: Secondary | ICD-10-CM | POA: Diagnosis not present

## 2016-08-12 DIAGNOSIS — I10 Essential (primary) hypertension: Secondary | ICD-10-CM | POA: Diagnosis not present

## 2016-08-12 DIAGNOSIS — K219 Gastro-esophageal reflux disease without esophagitis: Secondary | ICD-10-CM | POA: Diagnosis not present

## 2016-08-13 DIAGNOSIS — K219 Gastro-esophageal reflux disease without esophagitis: Secondary | ICD-10-CM | POA: Diagnosis not present

## 2016-08-13 DIAGNOSIS — I10 Essential (primary) hypertension: Secondary | ICD-10-CM | POA: Diagnosis not present

## 2016-08-13 DIAGNOSIS — R531 Weakness: Secondary | ICD-10-CM | POA: Diagnosis not present

## 2016-08-14 DIAGNOSIS — K219 Gastro-esophageal reflux disease without esophagitis: Secondary | ICD-10-CM | POA: Diagnosis not present

## 2016-08-14 DIAGNOSIS — I10 Essential (primary) hypertension: Secondary | ICD-10-CM | POA: Diagnosis not present

## 2016-08-14 DIAGNOSIS — R531 Weakness: Secondary | ICD-10-CM | POA: Diagnosis not present

## 2016-08-15 DIAGNOSIS — I10 Essential (primary) hypertension: Secondary | ICD-10-CM | POA: Diagnosis not present

## 2016-08-15 DIAGNOSIS — R531 Weakness: Secondary | ICD-10-CM | POA: Diagnosis not present

## 2016-08-15 DIAGNOSIS — K219 Gastro-esophageal reflux disease without esophagitis: Secondary | ICD-10-CM | POA: Diagnosis not present

## 2016-08-16 DIAGNOSIS — K219 Gastro-esophageal reflux disease without esophagitis: Secondary | ICD-10-CM | POA: Diagnosis not present

## 2016-08-16 DIAGNOSIS — I10 Essential (primary) hypertension: Secondary | ICD-10-CM | POA: Diagnosis not present

## 2016-08-16 DIAGNOSIS — R29898 Other symptoms and signs involving the musculoskeletal system: Secondary | ICD-10-CM | POA: Diagnosis not present

## 2016-08-16 LAB — C-REACTIVE PROTEIN: CRP: 4.4 mg/L (ref 0.0–4.9)

## 2016-08-16 LAB — ACETYLCHOLINE RECEPTOR, BINDING: AChR Binding Ab, Serum: <0.03 nmol/L (ref 0.00–0.24)

## 2016-08-16 LAB — CK: CK TOTAL: 54 U/L (ref 24–173)

## 2016-08-16 LAB — RPR: RPR Ser Ql: NONREACTIVE

## 2016-08-16 LAB — HIV ANTIBODY (ROUTINE TESTING W REFLEX): HIV Screen 4th Generation wRfx: NONREACTIVE

## 2016-08-16 LAB — VITAMIN B12: Vitamin B-12: 359 pg/mL (ref 232–1245)

## 2016-08-16 LAB — ACETYLCHOLINE RECEPTOR, MODULATING

## 2016-08-16 LAB — FOLATE: FOLATE: 11.2 ng/mL (ref >3.0)

## 2016-08-16 LAB — ANA W/REFLEX IF POSITIVE: Anti Nuclear Antibody(ANA): NEGATIVE

## 2016-08-16 LAB — FERRITIN: Ferritin: 16 ng/mL (ref 15–150)

## 2016-08-16 LAB — COPPER, SERUM: Copper: 109 ug/dL (ref 72–166)

## 2016-08-16 LAB — SEDIMENTATION RATE: SED RATE: 50 mm/h — AB (ref 0–32)

## 2016-08-17 DIAGNOSIS — R531 Weakness: Secondary | ICD-10-CM | POA: Diagnosis not present

## 2016-08-17 DIAGNOSIS — K219 Gastro-esophageal reflux disease without esophagitis: Secondary | ICD-10-CM | POA: Diagnosis not present

## 2016-08-17 DIAGNOSIS — I10 Essential (primary) hypertension: Secondary | ICD-10-CM | POA: Diagnosis not present

## 2016-08-17 DIAGNOSIS — R29898 Other symptoms and signs involving the musculoskeletal system: Secondary | ICD-10-CM | POA: Diagnosis not present

## 2016-08-19 DIAGNOSIS — I10 Essential (primary) hypertension: Secondary | ICD-10-CM | POA: Diagnosis not present

## 2016-08-19 DIAGNOSIS — M6281 Muscle weakness (generalized): Secondary | ICD-10-CM | POA: Diagnosis not present

## 2016-08-19 DIAGNOSIS — R531 Weakness: Secondary | ICD-10-CM | POA: Diagnosis not present

## 2016-08-22 ENCOUNTER — Ambulatory Visit: Payer: 59 | Admitting: Neurology

## 2016-08-22 ENCOUNTER — Telehealth: Payer: Self-pay | Admitting: *Deleted

## 2016-08-22 DIAGNOSIS — I10 Essential (primary) hypertension: Secondary | ICD-10-CM | POA: Diagnosis not present

## 2016-08-22 DIAGNOSIS — R531 Weakness: Secondary | ICD-10-CM | POA: Diagnosis not present

## 2016-08-22 DIAGNOSIS — M6281 Muscle weakness (generalized): Secondary | ICD-10-CM | POA: Diagnosis not present

## 2016-08-22 NOTE — Telephone Encounter (Signed)
No show - called morning of appt to cancel.

## 2016-08-23 ENCOUNTER — Encounter: Payer: Self-pay | Admitting: Neurology

## 2016-08-23 DIAGNOSIS — M6281 Muscle weakness (generalized): Secondary | ICD-10-CM | POA: Diagnosis not present

## 2016-08-23 DIAGNOSIS — I10 Essential (primary) hypertension: Secondary | ICD-10-CM | POA: Diagnosis not present

## 2016-08-23 DIAGNOSIS — R531 Weakness: Secondary | ICD-10-CM | POA: Diagnosis not present

## 2016-08-25 DIAGNOSIS — I10 Essential (primary) hypertension: Secondary | ICD-10-CM | POA: Diagnosis not present

## 2016-08-25 DIAGNOSIS — M6281 Muscle weakness (generalized): Secondary | ICD-10-CM | POA: Diagnosis not present

## 2016-08-25 DIAGNOSIS — R531 Weakness: Secondary | ICD-10-CM | POA: Diagnosis not present

## 2016-08-26 DIAGNOSIS — M6281 Muscle weakness (generalized): Secondary | ICD-10-CM | POA: Diagnosis not present

## 2016-08-26 DIAGNOSIS — R531 Weakness: Secondary | ICD-10-CM | POA: Diagnosis not present

## 2016-08-26 DIAGNOSIS — I10 Essential (primary) hypertension: Secondary | ICD-10-CM | POA: Diagnosis not present

## 2016-08-31 DIAGNOSIS — I10 Essential (primary) hypertension: Secondary | ICD-10-CM | POA: Diagnosis not present

## 2016-08-31 DIAGNOSIS — M6281 Muscle weakness (generalized): Secondary | ICD-10-CM | POA: Diagnosis not present

## 2016-08-31 DIAGNOSIS — R531 Weakness: Secondary | ICD-10-CM | POA: Diagnosis not present

## 2016-09-01 DIAGNOSIS — M6281 Muscle weakness (generalized): Secondary | ICD-10-CM | POA: Diagnosis not present

## 2016-09-01 DIAGNOSIS — R531 Weakness: Secondary | ICD-10-CM | POA: Diagnosis not present

## 2016-09-01 DIAGNOSIS — I10 Essential (primary) hypertension: Secondary | ICD-10-CM | POA: Diagnosis not present

## 2016-09-09 ENCOUNTER — Ambulatory Visit (INDEPENDENT_AMBULATORY_CARE_PROVIDER_SITE_OTHER): Payer: 59 | Admitting: Neurology

## 2016-09-09 DIAGNOSIS — R531 Weakness: Secondary | ICD-10-CM | POA: Diagnosis not present

## 2016-09-09 DIAGNOSIS — Z981 Arthrodesis status: Secondary | ICD-10-CM

## 2016-09-09 DIAGNOSIS — R269 Unspecified abnormalities of gait and mobility: Secondary | ICD-10-CM | POA: Diagnosis not present

## 2016-09-09 DIAGNOSIS — R202 Paresthesia of skin: Secondary | ICD-10-CM

## 2016-09-09 DIAGNOSIS — R899 Unspecified abnormal finding in specimens from other organs, systems and tissues: Secondary | ICD-10-CM | POA: Diagnosis not present

## 2016-09-09 MED ORDER — DULOXETINE HCL 60 MG PO CPEP: 60.0000 mg | ORAL_CAPSULE | Freq: Every day | ORAL | 11 refills | Status: DC

## 2016-09-09 NOTE — Procedures (Signed)
Full Name: Amy Barry Gender: Female MRN #: [id number] Date of Birth: 10-01-2070    Visit Date: 09/09/16 09:04 Age: 46 Years 70 Months Old Examining Physician: Marcial Pacas, MD  Referring Physician: Krista Blue. MD History: 46 years old female, presenting with progressive worsening gait abnormality, variable effort on examinations  Summary of the test: Nerve conduction study: Right superficial peroneal, sural, median, ulnar, radial sensory responses were normal.  Right peroneal to EDB, tibial, median and ulnar motor responses were normal.  Electromyography: Selected needle examination of right upper and lower extremity muscles, right cervical, and lumbar sacral paraspinals were normal.   Conclusion: This is a normal study. There is no electrodiagnostic evidence of large fiber peripheral neuropathy, inflammatory myopathy, right cervical radiculopathy, or right lumbosacral radiculopathy.  ------------------------------- Marcial Pacas, M.D.  [facility] [place], [place] [address] Tel: [phone] Fax: [phone]        [facility]    Nerve / Sites Muscle Latency Ref. Amplitude Ref. Rel Amp Segments Distance Velocity Ref. Area    ms ms mV mV %  cm m/s m/s mVms  R Median - APB     Wrist APB 3.2 ?4.4 9.3 ?4.0 100 Wrist - APB 7   24.6     Upper arm APB 6.6  9.1  97.5 Upper arm - Wrist 20 60 ?49 24.4  R Ulnar - ADM     Wrist ADM 2.9 ?3.3 11.7 ?6.0 100 Wrist - ADM 7   39.8     B.Elbow ADM 5.9  11.7  100 B.Elbow - Wrist 18 60 ?49 40.5     A.Elbow ADM 7.8  10.7  90.9 A.Elbow - B.Elbow 12 62 ?49 38.4         A.Elbow - Wrist      R Peroneal - EDB     Ankle EDB 4.4 ?6.5 7.8 ?2.0 100 Ankle - EDB 9   22.9     Fib head EDB 10.8  6.8  86.9 Fib head - Ankle 29 45 ?44 19.5     Pop fossa EDB 12.5  7.5  111 Pop fossa - Fib head 8 48 ?44 23.3         Pop fossa - Ankle      R Tibial - AH     Ankle AH 4.7 ?5.8 12.9 ?4.0 100 Ankle - AH 9   41.5     Pop fossa AH 12.5  12.3   95.8 Pop fossa - Ankle 35 45 ?41 40.9             SNC    Nerve / Sites Rec. Site Peak Lat Ref.  Amp Ref. Segments Distance    ms ms V V  cm  R Radial - Anatomical snuff box (Forearm)     Forearm Wrist 2.4 ?2.9 24 ?15 Forearm - Wrist 10  R Sural - Ankle (Calf)     Calf Ankle 3.6 ?4.4 11 ?6 Calf - Ankle 14  R Superficial peroneal - Ankle     Lat leg Ankle 4.2 ?4.4 8 ?6 Lat leg - Ankle 14  R Median - Orthodromic (Dig II, Mid palm)     Dig II Wrist 2.9 ?3.4 30 ?10 Dig II - Wrist 13  R Ulnar - Orthodromic, (Dig V, Mid palm)     Dig V Wrist 2.5 ?3.1 20 ?5 Dig V - Wrist 11  F  Wave    Nerve F Lat Ref.   ms ms  R Tibial - AH 43.9 ?56.0  R Ulnar - ADM 24.9 ?32.0         H Reflex    Nerve H Lat Lat Hmax   ms ms   Left Right Ref. Left Right Ref.  Tibial - S[place]us 27.9 27.6 ?35.0 27.9 27.6 ?35.0         EMG full       EMG Summary Table    Spontaneous MUAP Recruitment  Muscle IA Fib PSW Fasc Other Amp Dur. Poly Pattern  R. Tibialis anterior Normal None None None _______ Normal Normal Normal Normal  R. Gastrocnemius (Medial head) Normal None None None _______ Normal Normal Normal Normal  R. Vastus lateralis Normal None None None _______ Normal Normal Normal Normal  R. Pronator teres Normal None None None _______ Normal Normal Normal Normal  R. First dorsal interosseous Normal None None None _______ Normal Normal Normal Normal  R. Deltoid Normal None None None _______ Normal Normal Normal Normal  R. Biceps brachii Normal None None None _______ Normal Normal Normal Normal  R. Cervical paraspinals Normal None None None _______ Normal Normal Normal Normal  R. Lumbar paraspinals (mid) Normal None None None _______ Normal Normal Normal Normal  R. Lumbar paraspinals (low) Normal None None None _______ Normal Normal Normal Normal

## 2016-09-09 NOTE — Progress Notes (Signed)
PATIENT: Amy Barry DOB: Aug 21, 1970  No chief complaint on file.    HISTORICAL  Amy Barry 46 years old right-handed female, accompanied by her husband, seen in refer by her primary care physician Donald Prose for evaluation of gait abnormality, initial evaluation was on Aug 03 2016.  I reviewed and summarized the referring note, she had a history of hypertension, acid reflux disease, anemia, I also reviewed operation record by Dr. 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  I personally reviewed MRI cervical in June 2017, cervical spondylosis, degenerative disc disease, moderate to prominent impingement at C6-7, left side disc protrusion, causing left foraminal impingement.  CT head without contrast in 2014, there was no acute abnormality.  She reported gradual onset gait abnormality since February 2018, initial symptoms were extreme fatigue, she also complains of right leg numbness weakness, right leg give out underneath her while ambulating, she was initially evaluated by her primary care physician in March 2018, had extensive laboratory evaluations, was put on vitamin D supplement, iron supplement without improving her symptoms.  Early April 2018, she noticed right first 3 toes numbness, quickly spreading to whole right lower extremity in 2- 3 days, then followed by right arm weakness, she also complains of heaviness throughout her body, left leg weakness, bilateral upper extremity weakness, has mild residual left arm numbness should which she attributed to previous cervical radiculopathy, she also noticed mild right chin numbness,  She was diagnosed with increased intracranial pressure,  mild change in her peripheral visual field, she denies dysarthria, no dysphagia, she denies incontinence, has chronic urinary urgency there was no significant change.  I reviewed the laboratory evaluation in April 2018, vitamin D level was 30 6.5, low iron 41, iron saturation 12, hemoglobin 12 point 1, CMV IgG antibody was elevated 5.9, ESR was elevated at 42, negative ANA, C-reactive protein was normal 4.3, EB virus antibody IgG was elevated more than 600, EB virus early antigen antibody was also elevated to 11 point 1, EB virus nuclear antigen antibody IgG was elevated 250, parvovirus B19 IgG antibody was elevated 5.2, vitamin B12 was normal 256, normal TSH 1.86, LDL was mildly elevated 114, cholesterol was 180, normal CMP, with creatinine of 0.64, UA showed no significant abnormality  Update Aug 05 2016: Accompanied by her husband at today's clinical visit,  we have reviewed MRI brain on Aug 03 2016, there was no significant abnormality. MRI of cervical spine showed C6-7 status post arthrodeses, CT cervical spine showed arthrodeses in place, well positioned. With minimum 2 mm extension posterior to the parents C6-7 interspace,    Patient continued to complains of worsening symptoms, this includes worsening gait abnormality, she also reported one episode of urinary urgency this morning. She also noticed bilateral upper extremity weakness, difficulty holding her head up, he denies bulbar symptoms no visual change,  I was able to talk with her previous neurosurgeon Dr. Sherley Bounds, who has already reviewed the film, per Dr. Ronnald Ramp, there was no significant findings on MRI cervical to explain her profound symptoms. She will have appointment with Dr. Sherley Bounds in next few days.  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  Electrodiagnostic study today is normal, in specific, there is no evidence of large fiber peripheral neuropathy, right lumbosacral radiculopathy, right cervical radiculopathy, or inflammatory myopathy.  She continue complains of significant gait abnormality, stated that she could not sit up by herself, she clearly showed variable effort awkward posturing during examinations,  REVIEW OF SYSTEMS: Full 14 system review of systems performed  saidand notable only for As above,  ALLERGIES: Allergies  Allergen Reactions  . Metoclopramide Hcl Other (See Comments)    Shakiness   . Sumatriptan Other (See Comments)    High blood pressure  . Demerol [Meperidine] Nausea And Vomiting  . Tramadol Nausea Only  . Codeine Palpitations  . Meperidine Hcl Nausea And Vomiting  . Oxycodone-Acetaminophen Other (See Comments)    Drowsy  . Propoxyphene N-Acetaminophen Nausea And Vomiting    HOME MEDICATIONS: Current Outpatient Prescriptions  Medication Sig Dispense Refill  . amLODipine (NORVASC) 10 MG tablet TAKE ONE TABLET BY MOUTH EVERY MORNING (Patient taking differently: TAKE 10 MG BY MOUTH EVERY MORNING) 90 tablet 3  . Cholecalciferol (VITAMIN D3 PO) Take 4,000 Units by mouth daily.     Marland Kitchen FERROCITE 324 MG TABS tablet Take 1 tablet by mouth daily.  5  . furosemide (LASIX) 40 MG tablet Take 1 tablet (40 mg  total) by mouth daily. 90 tablet 3  . metoprolol (LOPRESSOR) 100 MG tablet TAKE ONE TABLET BY MOUTH TWICE DAILY (Patient taking differently: TAKE 100 MG BY MOUTH TWICE DAILY) 180 tablet 3  . Multiple Vitamin (MULTIVITAMIN WITH MINERALS) TABS Take 1 tablet by mouth every morning.    Marland Kitchen omeprazole (PRILOSEC) 40 MG capsule Take 1 capsule (40 mg total) by mouth 2 (two) times daily. 60 capsule 3  . ondansetron (ZOFRAN) 4 MG tablet Take 1 tablet (4 mg total) by mouth every 8 (eight) hours as needed for nausea or vomiting. 20 tablet 0  . potassium chloride (K-DUR) 10 MEQ tablet TAKE ONE TABLET BY MOUTH ONCE DAILY (Patient taking differently: TAKE 10 MEQ BY MOUTH ONCE DAILY) 90 tablet 3   No current facility-administered medications for this visit.     PAST MEDICAL HISTORY: Past Medical History:  Diagnosis Date  . Anemia   . Dysrhythmia    pvc's holter monitor started lopressor- no return visit  . GERD (gastroesophageal reflux disease)   . Headache(784.0)   . Heart murmur    child no echo  . History of kidney stones   . Hypertension   . IBS (irritable bowel syndrome)   . Low vitamin D level   . Lower extremity weakness   . Neuromuscular disorder ([place])   . Numbness and tingling   . Osteoarthritis   . Pneumonia    January- tx with IV antibiotics  for pneumonia in 04/2015   . PVC (premature ventricular contraction)   . Dyskinesia  following Demerol treatment while we    . Sleep concern 2013   toldf that she doesn 't have apnea but she stays sleeping a lot  . Upper extremity weakness     PAST SURGICAL HISTORY: Past Surgical History:  Procedure Laterality Date  . 2 ovarian cysts     age 66  . CERVICAL DISC ARTHROPLASTY N/A 10/23/2015   Procedure: Cervical Disc Arthroplasty Cervical six-seven;  Surgeon: Eustace Moore, MD;  Location: [facility];  Service: Neurosurgery;  Laterality: N/A;  . ESOPHAGOGASTRODUODENOSCOPY     for GERD  . LAPAROSCOPIC ABDOMINAL EXPLORATION     dx ibs  . TUBAL  LIGATION      FAMILY HISTORY: Family History  Problem Relation Age of Onset  . Arthritis Unknown   . Hyperlipidemia Unknown   . Hypertension Daughter   . Colon polyps Daughter 9       hermatoma oversized polyps-benign  . Stroke Unknown   . Leukemia Maternal Grandmother   . Heart disease Maternal Grandmother   . Asthma Sister   . Hypertension Mother   . Atrial fibrillation Mother   . Sarcoidosis Mother   . Healthy Father   . Colon cancer Neg Hx     SOCIAL HISTORY:  Social History   Social History  . Marital status: Married    Spouse name: N/A  . Number of children: 2  . Years of education: College   Occupational History  . RN [employer]   Social History Main Topics  . Smoking status: Never Smoker  . Smokeless tobacco: Never Used  . Alcohol use No  . Drug use: No  . Sexual activity: Not on file   Other Topics Concern  . Not on file   Social History Narrative   Lives at home with husband.   Right-handed.   Occasional caffeine use.     PHYSICAL EXAM   There were no vitals filed for this visit.  Not recorded      There is no height or weight on file to calculate BMI.  PHYSICAL EXAMNIATION:  Gen: NAD, conversant, well nourised, obese, well groomed                     Cardiovascular: Regular rate rhythm, no peripheral edema, warm, nontender. Eyes: Conjunctivae clear without exudates or hemorrhage Neck: Supple, no carotid bruits. Pulmonary: Clear to auscultation bilaterally   NEUROLOGICAL EXAM:Depressed looking middle-age female in wheelchair  MENTAL STATUS: patient is tearful during today's interview Speech:    Speech is normal; fluent and spontaneous with normal comprehension.  Cognition:     Orientation to time, place and person     Normal recent and remote memory     Normal Attention span and concentration     Normal Language, naming, repeating,spontaneous speech     Fund of knowledge   CRANIAL NERVES: CN II: Visual fields are full  to confrontation. Fundoscopic exam is normal with sharp discs and no vascular changes. Pupils are round equal and briskly reactive to light. CN III, IV, VI: extraocular movement are normal. No ptosis. CN V: Facial sensation is intact to pinprick in all 3 divisions bilaterally. Corneal responses are intact.  CN VII: Face is symmetric with normal eye closure and smile. CN VIII: Hearing is normal to rubbing fingers CN IX, X: Palate elevates symmetrically. Phonation is normal. CN XI: Head turning and shoulder shrug are intact CN XII: Tongue is midline with normal movements and no atrophy.  MOTOR:  Variable effort on exam  REFLEXES: Reflexes  are 3 and symmetric at the biceps, triceps, knees, and ankles. Plantar responses are flexor.  SENSORY: Intact to light touch, pinprick, positional sensation and vibratory sensation are intact in fingers and toes.  COORDINATION: Rapid alternating movements and fine finger movements are intact. There is no dysmetria on finger-to-nose and heel-knee-shin.    GAIT/STANCE:     DIAGNOSTIC DATA (LABS, IMAGING, TESTING) - I reviewed patient records, labs, notes, testing and imaging myself where available.   ASSESSMENT AND PLAN  Amy Barry is a 46 y.o. female   Subacute onset bilateral lower extremity and upper extremity weakness, paresthesia since Feb 2018  Hyperreflexia could due to previous evidence of C6-7 anterior decompression arthrodesis,   variable effort on examinations,  Extensive evaluation detailed above fail to demonstrate etiology, Abnormal laboratory evaluations  Elevated ESR, we will repeat laboratory evaluations, Deep body achy pain, Lower extremity Pain  MRI lumbar  Proceed with Cymbalta 60 mg daily,   I will call her result, if there is any abnormalities on evaluations, will expand diagnostic workup then.  There was no neurological expectoration for her significant functional limitations based on current evaluations, she is to  continue follow-up with her primary care physician    Marcial Pacas, M.D. Ph.D.  [facility] [address], [place], [place] [address] Ph: [phone] Fax: [phone]  CC: Donald Prose, MD

## 2016-09-10 LAB — CK: Total CK: 61 U/L (ref 24–173)

## 2016-09-10 LAB — C-REACTIVE PROTEIN: CRP: 4.8 mg/L (ref 0.0–4.9)

## 2016-09-10 LAB — SEDIMENTATION RATE: SED RATE: 29 mm/h (ref 0–32)

## 2016-09-14 ENCOUNTER — Telehealth: Payer: Self-pay | Admitting: *Deleted

## 2016-09-14 NOTE — Telephone Encounter (Signed)
Called and LVM for patient about normal labs per Dr Krista Blue note. Gave [facility] phone number if she has further questions or concerns.

## 2016-09-14 NOTE — Telephone Encounter (Signed)
-----   Message from Marcial Pacas, MD sent at 09/12/2016 10:12 AM EDT ----- Please call patient for normal laboratory result

## 2016-09-15 ENCOUNTER — Encounter: Payer: Self-pay | Admitting: Neurology

## 2016-09-19 ENCOUNTER — Ambulatory Visit: Payer: 59 | Attending: Family Medicine | Admitting: Physical Therapy

## 2016-09-19 ENCOUNTER — Encounter: Payer: Self-pay | Admitting: Physical Therapy

## 2016-09-19 DIAGNOSIS — Z9181 History of falling: Secondary | ICD-10-CM

## 2016-09-19 DIAGNOSIS — R2681 Unsteadiness on feet: Secondary | ICD-10-CM | POA: Diagnosis not present

## 2016-09-19 DIAGNOSIS — M6281 Muscle weakness (generalized): Secondary | ICD-10-CM

## 2016-09-19 DIAGNOSIS — R2689 Other abnormalities of gait and mobility: Secondary | ICD-10-CM | POA: Diagnosis present

## 2016-09-19 DIAGNOSIS — R29818 Other symptoms and signs involving the nervous system: Secondary | ICD-10-CM | POA: Diagnosis present

## 2016-09-19 NOTE — Therapy (Signed)
[facility] [address] [place], [place], [address] Phone: [phone]   Fax:  [phone]  Physical Therapy Evaluation  Patient Details  Name: Amy Barry MRN: [id number] Date of Birth: 03-Jul-1970 Referring Provider: Donald Prose MD   Encounter Date: 09/19/2016      PT End of Session - 09/19/16 1548    Visit Number 1   Number of Visits 20   Date for PT Re-Evaluation 11/25/16   Authorization Type [employer] - Visit Number 1   Authorization - Number of Visits 20   PT Start Time 1400   PT Stop Time 0867   PT Time Calculation (min) 45 min   Equipment Utilized During Treatment Gait belt   Activity Tolerance Patient tolerated treatment well   Behavior During Therapy WFL for tasks assessed/performed      Past Medical History:  Diagnosis Date  . Anemia   . Dysrhythmia    pvc's holter monitor started lopressor- no return visit  . GERD (gastroesophageal reflux disease)   . Headache(784.0)   . Heart murmur    child no echo  . History of kidney stones   . Hypertension   . IBS (irritable bowel syndrome)   . Low vitamin D level   . Lower extremity weakness   . Neuromuscular disorder ([place])   . Numbness and tingling   . Osteoarthritis   . Pneumonia    January- tx with IV antibiotics  for pneumonia in 04/2015   . PVC (premature ventricular contraction)   . Seizure disorder ([place])    denied seizures- has dyskinesia  . Sleep concern 2013   toldf that she doesn 't have apnea but she stays sleeping a lot  . Upper extremity weakness     Past Surgical History:  Procedure Laterality Date  . 2 ovarian cysts     age 46  . CERVICAL DISC ARTHROPLASTY N/A 10/23/2015   Procedure: Cervical Disc Arthroplasty Cervical six-seven;  Surgeon: Eustace Moore, MD;  Location: [facility];  Service: Neurosurgery;  Laterality: N/A;  . ESOPHAGOGASTRODUODENOSCOPY     for GERD  . LAPAROSCOPIC ABDOMINAL EXPLORATION      dx ibs  . TUBAL LIGATION      There were no vitals filed for this visit.       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 Patient reports she noted her RLE would involuntarily internally rotate + flex intermittently back in March 2018, but would return to baseline between episodes of increased tone. This became progressively worse on the RLE, and then the same pattern occurred on LLE. Patient reports this progression became worse, and has now resulted in weakness in BLEs. Patient was hospitalized at [facility] (08/08/2016-08/17/2016), and during this hospitalization period the weakness spread to BUEs and her neck and head control. Patient reports a normal baseline of mild LUE numbness. Patient's husband reports that she has large movements of BLEs and BUEs in her sleep.    Patient is accompained by: Family member  husband - [name]   Limitations Sitting;Lifting;Standing;Walking;House hold activities   How long can you sit comfortably? 2 hrs prior to onset of pain    Patient Stated Goals sitting upright, walking, driving, cooking, cleaning, return to work, walk the dog    Currently in Pain?  Yes   Pain Score 5   as high as 8/10; as low as 3-4/10   Pain Location Leg   Pain Orientation Right;Left   Pain Descriptors / Indicators Aching;Other (Comment);Heaviness  stinging    Pain Type Chronic pain   Pain Onset More than a month ago   Pain Frequency Constant   Aggravating Factors  sitting up in chair for several hours; lying flat (no turning)    Pain Relieving Factors medication; side lying    Multiple Pain Sites Yes   Pain Score 4  constantly    Pain Location Arm   Pain Orientation Right;Left   Pain Descriptors / Indicators Aching   Pain Type Chronic pain   Pain Onset More than a month ago   Pain Frequency Constant    Aggravating Factors  exercising (using Theraband in activities/exercises); rolling w/c on carpet    Pain Relieving Factors medication             [facility] PT Assessment - 09/19/16 1400      Assessment   Medical Diagnosis Diffuse weakness and functional neurological disorder    Referring Provider Donald Prose MD    Onset Date/Surgical Date 09/15/16  PT referral    Prior Therapy [facility]      Precautions   Precautions Fall     Balance Screen   Has the patient fallen in the past 6 months Yes   How many times? 1  from sitting EOB <> stand in Feb. 2018; "legs gave out"    Has the patient had a decrease in activity level because of a fear of falling?  Yes   Is the patient reluctant to leave their home because of a fear of falling?  Yes     [place] Private residence   Living Arrangements Spouse/significant other;Children  daughters 47 & 46 years old    Type of [place] Access Level entry  small threshold   Home Layout Two level   Alternate Level Stairs-Number of Steps 14  7 steps + platform + 7 steps; half bath downstairs    Alternate Level Stairs-Rails Right;Left  R on first 7 steps; L on second set of 7 steps   [place] - 2 wheels;[place] - 4 wheels;Wheelchair - manual;[place] - standard;Bedside commode   Additional Comments Driveway is an incline. Walkway from driveway to front door is level. All bedrooms and FULL bathrooms are upstairs.      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 Completes job duties from a computer/desk/telephone.   Leisure walk the dog Risk manager);      Cognition   Overall Cognitive Status Within Functional Limits for tasks assessed     Observation/Other Assessments   Focus on Therapeutic Outcomes (FOTO)  27   Neuro Quality of Life  Lower Extremity section = 20.3     Posture/Postural Control    Posture/Postural Control Postural limitations   Postural Limitations Rounded Shoulders;Forward head   Posture Comments Patient requires trunk support via the w/c or mod A from PT to maintain upright, seated posture      Tone   Assessment Location Right Lower Extremity;Left Lower Extremity     ROM / Strength   AROM / PROM / Strength Strength     Strength   Overall Strength Deficits   Strength Assessment Site Hip;Knee;Ankle   Right/Left  Hip Right;Left   Right Hip Flexion 2/5   Right Hip Extension 2/5   Right Hip ABduction 2-/5   Right Hip ADduction 2/5  very effortful    Left Hip Flexion 2-/5   Left Hip Extension 2/5   Left Hip ABduction 2-/5   Left Hip ADduction 2/5   Right/Left Knee Right;Left   Right Knee Flexion 1/5   Right Knee Extension 3-/5   Left Knee Flexion 1/5   Left Knee Extension 3-/5   Right/Left Ankle Right;Left   Right Ankle Dorsiflexion 2-/5   Left Ankle Dorsiflexion 2-/5     Transfers   Transfers Squat Pivot Transfers;Supine to Sit;Sit to Supine   Squat Pivot Transfers 2: Max assist   Supine to Sit 3: Mod assist;With upper extremity assist;From bed   Supine to Sit Details (indicate cue type and reason) --   Sit to Supine 3: Mod assist;With upper extremity assist;To bed;Uncontrolled descent   Comments Patient lacks trunk control to maintain upright posture without support.     Balance   Balance Assessed Yes     Static Sitting Balance   Static Sitting - Balance Support Bilateral upper extremity supported;Feet supported   Static Sitting - Level of Assistance 3: Mod assist     Dynamic Sitting Balance   Dynamic Sitting - Balance Support Bilateral upper extremity supported;Feet supported   Dynamic Sitting - Level of Assistance 3: Mod assist   Dynamic Sitting - Balance Activities Head control activities     RLE Tone   RLE Tone Hypertonic     LLE Tone   LLE Tone Hypertonic            Objective measurements completed on examination: See above  findings.                  PT Education - 09/19/16 1546    Education provided Yes   Education Details plan of care    Person(s) Educated Patient   Methods Explanation   Comprehension Verbalized understanding          PT Short Term Goals - 09/19/16 1610      PT SHORT TERM GOAL #1   Title Patient will verbalize understanding and return demonstration for initial HEP to improve LE strength and balance. (TARGET DATE: 10/21/2016)    Time 4   Period Weeks   Status New     PT SHORT TERM GOAL #2   Title Patient will be able to safely maintain upright posture sitting edge of bed with min A for 1 minute to indicate improvement in trunk control. (TARGET DATE: 10/21/2016)    Time 4   Period Weeks   Status New     PT SHORT TERM GOAL #3   Title Patient will demonstrate ability to transfer from w/c to mat with min A to indicate improvement in functional mobility and a decrease in her risk of falling. (TARGET DATE: 10/21/2016)    Time 4   Period Weeks   Status New     PT SHORT TERM GOAL #4   Title Patient will demonstrate ability to maintain static standing for 30s with BUE support on RW and min assist to indicate a decrease in risk of falling. (TARGET DATE: 10/21/2016)    Time 4   Period Weeks   Status New           PT Long Term Goals - 09/19/16 1601      PT LONG TERM GOAL #1   Title Patient will  verbalize understanding and return demonstration for ongoing HEP. (TARGET DATE: 11/25/2016)    Time 10   Period Weeks   Status New     PT LONG TERM GOAL #2   Title Patient will demonstrate ability to safely maintain trunk control sitting balance static and reaches 6" modified indepedent to indicate a decrease in her risk of falling. (TARGET DATE: 11/25/2016)    Time 10   Period Weeks   Status New     PT LONG TERM GOAL #3   Title Patient will be able to transfer w/c to mat modified independent to indicate a decrease in her risk of falling during transfers. (TARGET DATE:  11/25/2016)    Time 10   Period Weeks   Status New     PT LONG TERM GOAL #4   Title Patient will demonstrate ability to climb stairs (1 rail) and min A to indicate a decreae in caregiver burden and a decrease in her risk of falling when climbing the stairs to get to the bedroom and bathroom at home. (TARGET DATE: 11/25/2016)    Time 10   Period Weeks   Status New     PT LONG TERM GOAL #5   Title Patient will demonstrate ability to ambulate 19ft with LRAD and min A to indicate improvement in ability to ambulate household distances with assistance. (TARGET DATE: 11/25/2016)    Time 10   Period Weeks   Status New     Additional Long Term Goals   Additional Long Term Goals Yes     PT LONG TERM GOAL #6   Title Patient will improvem FOTO score by >/= 10% to indicate improvement in self-perceived functional mobility. (TARGET DATE: 11/25/2016)    Time 10   Period Weeks   Status New                Plan - 09/19/16 1550    Clinical Impression Statement Patient is a 46 year old female presenting to [facility] for PT evaluation due to a functional neurological disorder resulting in BLE diffuse weakness with a progressive increase in LE weakness starting in May 2018. The following deficits were noted during the patient's exam: lack of trunk, postural or head control requiring her to have support in order to maintain an upright position placing her at an increased risk for falling, severe weakness in BLEs placing her at an increased risk for falling, increased BLE tone placing her at an increased risk for falling, and the patient's reported fear of falling coupled with a history of a fall indicate she is at an increased risk for future falls. Patient needs further evaluation of standing & gait as patient and husband report performing at home with his assistance and time during evaluation did not permit formal evaluation. Patient would benefit from skilled PT to address these impairments and functional  limitations to maximize functional mobility independence and reduce falls risk.    History and Personal Factors relevant to plan of care: HTN, cervical spondylosis, C6-7 L side disc protrusion causing L foraminal impingement, anemia, functional neurological disorder, anemia     Clinical Presentation Unstable   Clinical Presentation due to: progressive functional neurological disorder resulting in diffuse BLE weakness with unknown etiology   Clinical Decision Making High   Rehab Potential Good   Clinical Impairments Affecting Rehab Potential HTN, cervical spondylosis, C6-7 L side disc protrusion causing L foraminal impingement, anemia, functional neurological disorder, anemia     PT Frequency 2x / week  PT Duration Other (comment)  10 weeks    PT Treatment/Interventions ADLs/Self Care Home Management;Electrical Stimulation;Neuromuscular re-education;Balance training;Therapeutic exercise;Therapeutic activities;Functional mobility training;Stair training;Gait training;DME Instruction;Patient/family education;Energy conservation   PT Next Visit Plan Assess standing & gait, initiate HEP for LE stretching and strengthening; work on seated trunk control and transfers    Consulted and Agree with Plan of Care Patient;Family member/caregiver   Family Member Consulted husband - [name]       Patient will benefit from skilled therapeutic intervention in order to improve the following deficits and impairments:  Decreased activity tolerance, Decreased balance, Decreased mobility, Decreased knowledge of use of DME, Decreased endurance, Decreased strength, Impaired flexibility, Impaired tone  Visit Diagnosis: Unsteadiness on feet  Muscle weakness (generalized)  History of falling  Other abnormalities of gait and mobility  Other symptoms and signs involving the nervous system     Problem List Patient Active Problem List   Diagnosis Date Noted  . Abnormal laboratory test 09/09/2016  . Gait  abnormality 08/03/2016  . Weakness 08/03/2016  . Paresthesia 08/03/2016  . S/P cervical spinal fusion 10/23/2015  . Hypersomnia 12/08/2010  . 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  . EPIGASTRIC PAIN 10/13/2009  . IRRITABLE BOWEL SYNDROME 03/10/2009  . BACK PAIN, LUMBAR 07/01/2008  . COSTOCHONDRITIS 02/01/2008  . VIRAL URI 01/08/2008  . CHEST PAIN 01/08/2008  . 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 ARM PAIN 12/25/2007  . ANEMIA-NOS 10/02/2007  . Essential hypertension 10/02/2007  . GERD 10/02/2007  . POLYP, GALLBLADDER 10/02/2007  . UTERINE POLYP 10/02/2007  . OSTEOARTHRITIS 10/02/2007  . Dyskinesia 10/02/2007  . HEADACHE 10/02/2007  . PALPITATIONS 10/02/2007  . CARDIAC MURMUR, HX OF 10/02/2007  . NEPHROLITHIASIS, HX OF 10/02/2007   Arelia Sneddon, SPT 09/19/2016, 4:18 PM  Jamey Reas, PT, DPT  09/19/2016, 10:41 PM  [facility] [address] [place], [place], [address] Phone: [phone]   Fax:  [phone]  Name: Amy Barry MRN: [id number] Date of Birth: 28-Apr-1970

## 2016-09-20 ENCOUNTER — Telehealth: Payer: Self-pay | Admitting: Physical Therapy

## 2016-09-20 NOTE — Telephone Encounter (Signed)
Dr. Gari Crown, Amy Barry was evaluated by Jamey Reas, PT, DPT on 09/19/2016.  The patient would benefit from occupational therapy evaluation for assessment of BUEs and independence in ADLs.   If you agree, please place an order in [facility] workque in [facility] or fax the order to [phone]. Thank you, Amy Barry, SPT   Jamey Reas PT, DPT  Phone: [phone]

## 2016-09-22 ENCOUNTER — Ambulatory Visit: Payer: 59 | Admitting: Physical Therapy

## 2016-09-22 ENCOUNTER — Encounter: Payer: Self-pay | Admitting: Physical Therapy

## 2016-09-22 DIAGNOSIS — R29818 Other symptoms and signs involving the nervous system: Secondary | ICD-10-CM

## 2016-09-22 DIAGNOSIS — R2681 Unsteadiness on feet: Secondary | ICD-10-CM

## 2016-09-22 DIAGNOSIS — M6281 Muscle weakness (generalized): Secondary | ICD-10-CM

## 2016-09-22 DIAGNOSIS — R2689 Other abnormalities of gait and mobility: Secondary | ICD-10-CM

## 2016-09-22 DIAGNOSIS — Z9181 History of falling: Secondary | ICD-10-CM

## 2016-09-22 NOTE — Therapy (Signed)
[facility] [address] [place], [place], [address] Phone: [phone]   Fax:  [phone]  Physical Therapy Treatment  Patient Details  Name: Amy Barry MRN: [id number] Date of Birth: [birth year] Referring Provider: Donald Prose MD   Encounter Date: 09/22/2016      PT End of Session - 09/22/16 1559    Visit Number 2   Number of Visits 20   Date for PT Re-Evaluation 11/25/16   Authorization Type [employer]    Authorization - Visit Number 2   Authorization - Number of Visits 20   PT Start Time 4081   PT Stop Time 1530   PT Time Calculation (min) 43 min   Equipment Utilized During Treatment Gait belt   Activity Tolerance Patient tolerated treatment well   Behavior During Therapy WFL for tasks assessed/performed      Past Medical History:  Diagnosis Date  . Anemia   . Dysrhythmia    pvc's holter monitor started lopressor- no return visit  . GERD (gastroesophageal reflux disease)   . Headache(784.0)   . Heart murmur    child no echo  . History of kidney stones   . Hypertension   . IBS (irritable bowel syndrome)   . Low vitamin D level   . Lower extremity weakness   . Neuromuscular disorder ([place])   . Numbness and tingling   . Osteoarthritis   . Pneumonia    January- tx with IV antibiotics  for pneumonia in 04/2015   . PVC (premature ventricular contraction)   . Seizure disorder ([place])    denied seizures- has dyskinesia  . Sleep concern 2013   toldf that she doesn 't have apnea but she stays sleeping a lot  . Upper extremity weakness     Past Surgical History:  Procedure Laterality Date  . 2 ovarian cysts     age 46  . CERVICAL DISC ARTHROPLASTY N/A 10/23/2015   Procedure: Cervical Disc Arthroplasty Cervical six-seven;  Surgeon: Eustace Moore, MD;  Location: [facility];  Service: Neurosurgery;  Laterality: N/A;  . ESOPHAGOGASTRODUODENOSCOPY     for GERD  . LAPAROSCOPIC ABDOMINAL EXPLORATION      dx ibs  . TUBAL LIGATION      There were no vitals filed for this visit.      Subjective Assessment - 09/22/16 1451    Subjective Patient reports she feels her neck is "flimsy" today relative to earlier in the week. She reports this is somewhat typical. Patient denies any falls.    Patient is accompained by: Family member  daughters    Limitations Sitting;Lifting;Standing;Walking;House hold activities   How long can you sit comfortably? 2 hrs prior to onset of pain    Patient Stated Goals sitting upright, walking, driving, cooking, cleaning, return to work, walk the dog    Currently in Pain? Yes   Pain Score 4    Pain Location Leg   Pain Orientation Right;Left   Pain Descriptors / Indicators Aching;Heaviness   Pain Type Chronic pain   Pain Onset More than a month ago   Pain Onset More than a month ago                         [facility] Adult PT Treatment/Exercise - 09/22/16 0245      Transfers   Transfers Sit to Stand;Stand to Sit   Sit to Stand 3: Mod assist;With upper extremity assist;From bed;From chair/3-in-1  Sit to Stand Details Verbal cues for sequencing;Verbal cues for precautions/safety;Verbal cues for safe use of DME/AE   Stand to Sit 3: Mod assist;With upper extremity assist;To bed;To chair/3-in-1   Stand to Sit Details (indicate cue type and reason) Verbal cues for sequencing;Verbal cues for technique;Verbal cues for safe use of DME/AE   Squat Pivot Transfers --   Supine to Sit --   Sit to Supine --   Comments --     Ambulation/Gait   Ambulation/Gait Yes   Ambulation/Gait Assistance 2: Max assist;1: +2 Total assist   Ambulation/Gait Assistance Details PT required to provide verbal cueing for RW management/placement and foot placement. Due to poor coordination and motor recruitment, patient will hold 3-4 seconds in single limb stance during gait.    Ambulation Distance (Feet) 8 Feet   Assistive device Rolling Tickner   Gait Pattern Ataxic   ambulates on supinated R ankle    Ambulation Surface Level;Indoor     Posture/Postural Control   Posture/Postural Control Postural limitations   Postural Limitations Rounded Shoulders;Forward head   Posture Comments Patient requires trunk support via the w/c or mod A from PT to maintain upright, seated posture      Neuro Re-ed    Neuro Re-ed Details  Seated edge of mat with back support on physioball and PT sitting on physioball providing trunk and head support: patient rolled computer table (on wheels) fwd/backwards with BUEs supported on table. Performed unilateral arm raises, then progressed to B arm raises from supported position on table. Performed 10 reps of each activity. Performed foot taps on foam bubbles from this same seated position. Required SPT to move LE to tap bubble first, then progressed to cueing patient to "gently" help in foot tap. Performed 5 taps bilaterally.                   PT Short Term Goals - 09/19/16 1610      PT SHORT TERM GOAL #1   Title Patient will verbalize understanding and return demonstration for initial HEP to improve LE strength and balance. (TARGET DATE: 10/21/2016)    Time 4   Period Weeks   Status New     PT SHORT TERM GOAL #2   Title Patient will be able to safely maintain upright posture sitting edge of bed with min A for 1 minute to indicate improvement in trunk control. (TARGET DATE: 10/21/2016)    Time 4   Period Weeks   Status New     PT SHORT TERM GOAL #3   Title Patient will demonstrate ability to transfer from w/c to mat with min A to indicate improvement in functional mobility and a decrease in her risk of falling. (TARGET DATE: 10/21/2016)    Time 4   Period Weeks   Status New     PT SHORT TERM GOAL #4   Title Patient will demonstrate ability to maintain static standing for 30s with BUE support on RW and min assist to indicate a decrease in risk of falling. (TARGET DATE: 10/21/2016)    Time 4   Period Weeks   Status  New           PT Long Term Goals - 09/19/16 1601      PT LONG TERM GOAL #1   Title Patient will verbalize understanding and return demonstration for ongoing HEP. (TARGET DATE: 11/25/2016)    Time 10   Period Weeks   Status New     PT LONG TERM GOAL #  2   Title Patient will demonstrate ability to safely maintain trunk control sitting balance static and reaches 6" modified indepedent to indicate a decrease in her risk of falling. (TARGET DATE: 11/25/2016)    Time 10   Period Weeks   Status New     PT LONG TERM GOAL #3   Title Patient will be able to transfer w/c to mat modified independent to indicate a decrease in her risk of falling during transfers. (TARGET DATE: 11/25/2016)    Time 10   Period Weeks   Status New     PT LONG TERM GOAL #4   Title Patient will demonstrate ability to climb stairs (1 rail) and min A to indicate a decreae in caregiver burden and a decrease in her risk of falling when climbing the stairs to get to the bedroom and bathroom at home. (TARGET DATE: 11/25/2016)    Time 10   Period Weeks   Status New     PT LONG TERM GOAL #5   Title Patient will demonstrate ability to ambulate 54ft with LRAD and min A to indicate improvement in ability to ambulate household distances with assistance. (TARGET DATE: 11/25/2016)    Time 10   Period Weeks   Status New     Additional Long Term Goals   Additional Long Term Goals Yes     PT LONG TERM GOAL #6   Title Patient will improvem FOTO score by >/= 10% to indicate improvement in self-perceived functional mobility. (TARGET DATE: 11/25/2016)    Time 10   Period Weeks   Status New               Plan - 09/22/16 1605    Clinical Impression Statement Today's skilled PT session focused on assessing patient's standing balance and gait and advancing patient's sitting balance activities fostering head and trunk control during dynamic upper and lower extremity exercises. Patient demonstrates an inability to selectively  recruit muscle activation, and benefitted from the PT passively moving the limb through the desired motion/activity prior to having the patient perform the task. Patient also benefitted from cueing to "gently" help PT perform the activity to decrease patient's motor response. Patient required multiple rest breaks due to fatigue. Patient will benefit from continued skilled PT to address functional mobility deficits.    Rehab Potential Good   Clinical Impairments Affecting Rehab Potential HTN, cervical spondylosis, C6-7 L side disc protrusion causing L foraminal impingement, anemia, functional neurological disorder, anemia     PT Frequency 2x / week   PT Duration Other (comment)  10 weeks    PT Treatment/Interventions ADLs/Self Care Home Management;Electrical Stimulation;Neuromuscular re-education;Balance training;Therapeutic exercise;Therapeutic activities;Functional mobility training;Stair training;Gait training;DME Instruction;Patient/family education;Energy conservation   PT Next Visit Plan supine PNF, seated trunk control exercises with dynamic UE or LE motion with emphasis on coordination and proper motor recruitment; progress gait training if +2 assistance is available and try ankle support (R ankle) to limit supination (try platform Struve?), utilize graded movement to foster proper motor control   Consulted and Agree with Plan of Care Patient;Family member/caregiver   Family Member Consulted daughters       Patient will benefit from skilled therapeutic intervention in order to improve the following deficits and impairments:  Decreased activity tolerance, Decreased balance, Decreased mobility, Decreased knowledge of use of DME, Decreased endurance, Decreased strength, Impaired flexibility, Impaired tone  Visit Diagnosis: Unsteadiness on feet  Muscle weakness (generalized)  History of falling  Other abnormalities of gait and mobility  Other symptoms and signs involving the nervous  system     Problem List Patient Active Problem List   Diagnosis Date Noted  . Abnormal laboratory test 09/09/2016  . Gait abnormality 08/03/2016  . Weakness 08/03/2016  . Paresthesia 08/03/2016  . S/P cervical spinal fusion 10/23/2015  . Hypersomnia 12/08/2010  . 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  . EPIGASTRIC PAIN 10/13/2009  . IRRITABLE BOWEL SYNDROME 03/10/2009  . BACK PAIN, LUMBAR 07/01/2008  . COSTOCHONDRITIS 02/01/2008  . VIRAL URI 01/08/2008  . CHEST PAIN 01/08/2008  . 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 ARM PAIN 12/25/2007  . ANEMIA-NOS 10/02/2007  . Essential hypertension 10/02/2007  . GERD 10/02/2007  . POLYP, GALLBLADDER 10/02/2007  . UTERINE POLYP 10/02/2007  . OSTEOARTHRITIS 10/02/2007  . Dyskinesia 10/02/2007  . HEADACHE 10/02/2007  . PALPITATIONS 10/02/2007  . CARDIAC MURMUR, HX OF 10/02/2007  . NEPHROLITHIASIS, HX OF 10/02/2007    Arelia Sneddon, SPT  09/22/2016, 4:27 PM  [facility] [address] [place], [place], [address] Phone: [phone]   Fax:  [phone]  Name: TAL NEER MRN: [id number] Date of Birth: 1970-10-05

## 2016-09-27 ENCOUNTER — Encounter: Payer: Self-pay | Admitting: Physical Therapy

## 2016-09-27 ENCOUNTER — Ambulatory Visit: Payer: 59 | Admitting: Physical Therapy

## 2016-09-27 DIAGNOSIS — R2681 Unsteadiness on feet: Secondary | ICD-10-CM

## 2016-09-27 DIAGNOSIS — Z9181 History of falling: Secondary | ICD-10-CM

## 2016-09-27 DIAGNOSIS — R2689 Other abnormalities of gait and mobility: Secondary | ICD-10-CM

## 2016-09-27 DIAGNOSIS — R29818 Other symptoms and signs involving the nervous system: Secondary | ICD-10-CM

## 2016-09-27 DIAGNOSIS — M6281 Muscle weakness (generalized): Secondary | ICD-10-CM

## 2016-09-27 NOTE — Therapy (Signed)
[facility] [address] [place], [place], [address] Phone: [phone]   Fax:  [phone]  Physical Therapy Treatment  Patient Details  Name: Amy Barry MRN: [id number] Date of Birth: 1970-10-19 Referring Provider: Donald Prose MD   Encounter Date: 09/27/2016      PT End of Session - 09/27/16 1644    Visit Number 3   Number of Visits 20   Date for PT Re-Evaluation 11/25/16   Authorization Type [employer]    Authorization - Visit Number 3   Authorization - Number of Visits 20   PT Start Time 1448   PT Stop Time 1615   PT Time Calculation (min) 45 min   Activity Tolerance Patient tolerated treatment well   Behavior During Therapy [facility] for tasks assessed/performed      Past Medical History:  Diagnosis Date  . Anemia   . Dysrhythmia    pvc's holter monitor started lopressor- no return visit  . GERD (gastroesophageal reflux disease)   . Headache(784.0)   . Heart murmur    child no echo  . History of kidney stones   . Hypertension   . IBS (irritable bowel syndrome)   . Low vitamin D level   . Lower extremity weakness   . Neuromuscular disorder ([place])   . Numbness and tingling   . Osteoarthritis   . Pneumonia    January- tx with IV antibiotics  for pneumonia in 04/2015   . PVC (premature ventricular contraction)   . Seizure disorder ([place])    denied seizures- has dyskinesia  . Sleep concern 2013   toldf that she doesn 't have apnea but she stays sleeping a lot  . Upper extremity weakness     Past Surgical History:  Procedure Laterality Date  . 2 ovarian cysts     age 48  . CERVICAL DISC ARTHROPLASTY N/A 10/23/2015   Procedure: Cervical Disc Arthroplasty Cervical six-seven;  Surgeon: Eustace Moore, MD;  Location: [facility];  Service: Neurosurgery;  Laterality: N/A;  . ESOPHAGOGASTRODUODENOSCOPY     for GERD  . LAPAROSCOPIC ABDOMINAL EXPLORATION     dx ibs  . TUBAL LIGATION      There were  no vitals filed for this visit.      Subjective Assessment - 09/27/16 1531    Subjective Pt demonstrates improved head and UE control today, less dyskinesia.  Reporting increased pain in bilat LE today-weather related-throbbing   Patient is accompained by: Family member   Limitations Sitting;Lifting;Standing;Walking;House hold activities   How long can you sit comfortably? 2 hrs prior to onset of pain    Patient Stated Goals sitting upright, walking, driving, cooking, cleaning, return to work, walk the dog    Currently in Pain? Yes   Pain Score 5    Pain Location Leg   Pain Orientation Right;Left   Pain Descriptors / Indicators Throbbing   Pain Type Chronic pain   Pain Onset Today   Aggravating Factors  lying down causes more throbbing                         [facility] Adult PT Treatment/Exercise - 09/27/16 1638      Neuro Re-ed    Neuro Re-ed Details  Applied air cast to R ankle to improve stability during standing/transfers.  In // bars performed NMR with focus on WB through bilat UE and LE and performing gait forwards<>retro x 8', alternating LE taps  to 2" step open chain, taps to 2" step with 10lb weight on stance LE for increased proprioceptive input, WB and stance control and then transitioning to foot sliding 6lb weight forwards and backwards x 3 reps each side to maintain contact and proprioceptive input/pressure while advancing LE.  Throughout session pt required max tactile cues for weight shifting and proximal stability through trunk and pelvis, trunk and LE control due to extreme ataxia during open chain and closed chain movement.  Required multiple seated rest breaks due to fatigue in UE and LE.                PT Education - 09/27/16 1642    Education provided Yes   Education Details air cast for ankle stability   Person(s) Educated Patient   Methods Explanation   Comprehension Verbalized understanding          PT Short Term Goals - 09/19/16  1610      PT SHORT TERM GOAL #1   Title Patient will verbalize understanding and return demonstration for initial HEP to improve LE strength and balance. (TARGET DATE: 10/21/2016)    Time 4   Period Weeks   Status New     PT SHORT TERM GOAL #2   Title Patient will be able to safely maintain upright posture sitting edge of bed with min A for 1 minute to indicate improvement in trunk control. (TARGET DATE: 10/21/2016)    Time 4   Period Weeks   Status New     PT SHORT TERM GOAL #3   Title Patient will demonstrate ability to transfer from w/c to mat with min A to indicate improvement in functional mobility and a decrease in her risk of falling. (TARGET DATE: 10/21/2016)    Time 4   Period Weeks   Status New     PT SHORT TERM GOAL #4   Title Patient will demonstrate ability to maintain static standing for 30s with BUE support on RW and min assist to indicate a decrease in risk of falling. (TARGET DATE: 10/21/2016)    Time 4   Period Weeks   Status New           PT Long Term Goals - 09/19/16 1601      PT LONG TERM GOAL #1   Title Patient will verbalize understanding and return demonstration for ongoing HEP. (TARGET DATE: 11/25/2016)    Time 10   Period Weeks   Status New     PT LONG TERM GOAL #2   Title Patient will demonstrate ability to safely maintain trunk control sitting balance static and reaches 6" modified indepedent to indicate a decrease in her risk of falling. (TARGET DATE: 11/25/2016)    Time 10   Period Weeks   Status New     PT LONG TERM GOAL #3   Title Patient will be able to transfer w/c to mat modified independent to indicate a decrease in her risk of falling during transfers. (TARGET DATE: 11/25/2016)    Time 10   Period Weeks   Status New     PT LONG TERM GOAL #4   Title Patient will demonstrate ability to climb stairs (1 rail) and min A to indicate a decreae in caregiver burden and a decrease in her risk of falling when climbing the stairs to get to the  bedroom and bathroom at home. (TARGET DATE: 11/25/2016)    Time 10   Period Weeks   Status New  PT LONG TERM GOAL #5   Title Patient will demonstrate ability to ambulate 75ft with LRAD and min A to indicate improvement in ability to ambulate household distances with assistance. (TARGET DATE: 11/25/2016)    Time 10   Period Weeks   Status New     Additional Long Term Goals   Additional Long Term Goals Yes     PT LONG TERM GOAL #6   Title Patient will improvem FOTO score by >/= 10% to indicate improvement in self-perceived functional mobility. (TARGET DATE: 11/25/2016)    Time 10   Period Weeks   Status New               Plan - 09/27/16 1645    Clinical Impression Statement Pt demonstrating improved head, trunk and UE control today; pt able to wheel self from waiting area to treatment area.  Attempted NMR in standing in // bars for increased UE stability beginning with tapping and then transitioning to closed chain sliding with ankle weights to provide increased proprioceptive input and stability.  Pt continues to present with very ataxic trunk and LE movements-will continue to address and progress towards LTG.     Rehab Potential Good   Clinical Impairments Affecting Rehab Potential HTN, cervical spondylosis, C6-7 L side disc protrusion causing L foraminal impingement, anemia, functional neurological disorder, anemia     PT Frequency 2x / week   PT Duration Other (comment)  10 weeks    PT Treatment/Interventions ADLs/Self Care Home Management;Electrical Stimulation;Neuromuscular re-education;Balance training;Therapeutic exercise;Therapeutic activities;Functional mobility training;Stair training;Gait training;DME Instruction;Patient/family education;Energy conservation   PT Next Visit Plan use air cast for R ankle support; supine PNF, seated trunk control exercises with dynamic UE or LE motion with emphasis on coordination and proper motor recruitment; progress gait training if +2  assistance is available and try ankle support (R ankle) to limit supination (try platform Walkins?), utilize graded movement to foster proper motor control.  May have to use weights on extremeties and all 4 extremeties in closed chain for increased proprioception and control   Recommended Other Services OT referral; air cast for R ankle stability in standing   Consulted and Agree with Plan of Care Patient;Family member/caregiver   Family Member Consulted daughters       Patient will benefit from skilled therapeutic intervention in order to improve the following deficits and impairments:  Decreased activity tolerance, Decreased balance, Decreased mobility, Decreased knowledge of use of DME, Decreased endurance, Decreased strength, Impaired flexibility, Impaired tone  Visit Diagnosis: Unsteadiness on feet  Muscle weakness (generalized)  History of falling  Other abnormalities of gait and mobility  Other symptoms and signs involving the nervous system     Problem List Patient Active Problem List   Diagnosis Date Noted  . Abnormal laboratory test 09/09/2016  . Gait abnormality 08/03/2016  . Weakness 08/03/2016  . Paresthesia 08/03/2016  . S/P cervical spinal fusion 10/23/2015  . Hypersomnia 12/08/2010  . 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  . EPIGASTRIC PAIN 10/13/2009  . IRRITABLE BOWEL SYNDROME 03/10/2009  . BACK PAIN, LUMBAR 07/01/2008  . COSTOCHONDRITIS 02/01/2008  . VIRAL URI 01/08/2008  . CHEST PAIN 01/08/2008  . 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 ARM PAIN 12/25/2007  . ANEMIA-NOS 10/02/2007  . Essential hypertension 10/02/2007  . GERD 10/02/2007  . POLYP, GALLBLADDER 10/02/2007  . UTERINE POLYP 10/02/2007  . OSTEOARTHRITIS 10/02/2007  . Dyskinesia 10/02/2007  . HEADACHE 10/02/2007  . PALPITATIONS 10/02/2007  . CARDIAC MURMUR, HX OF 10/02/2007  .  NEPHROLITHIASIS, HX OF 10/02/2007   Raylene Everts, PT, DPT 09/27/16    4:50 PM    [facility] [address] [place], [place], [address] Phone: [phone]   Fax:  [phone]  Name: Amy Barry MRN: [id number] Date of Birth: 1971/01/14

## 2016-09-30 ENCOUNTER — Encounter: Payer: Self-pay | Admitting: Physical Therapy

## 2016-09-30 ENCOUNTER — Ambulatory Visit: Payer: 59 | Admitting: Physical Therapy

## 2016-09-30 DIAGNOSIS — R29818 Other symptoms and signs involving the nervous system: Secondary | ICD-10-CM

## 2016-09-30 DIAGNOSIS — R2681 Unsteadiness on feet: Secondary | ICD-10-CM | POA: Diagnosis not present

## 2016-09-30 DIAGNOSIS — M6281 Muscle weakness (generalized): Secondary | ICD-10-CM

## 2016-09-30 DIAGNOSIS — R2689 Other abnormalities of gait and mobility: Secondary | ICD-10-CM

## 2016-09-30 DIAGNOSIS — Z9181 History of falling: Secondary | ICD-10-CM

## 2016-09-30 NOTE — Therapy (Signed)
[facility] [address] [place], [place], [address] Phone: [phone]   Fax:  [phone]  Physical Therapy Treatment  Patient Details  Name: Amy Barry MRN: [id number] Date of Birth: [birth year] Referring Provider: Donald Prose MD   Encounter Date: 09/30/2016      PT End of Session - 09/30/16 2115    Visit Number 4   Number of Visits 20   Date for PT Re-Evaluation 11/25/16   Authorization Type [facility] - Visit Number 4   Authorization - Number of Visits 20   PT Start Time 0277   PT Stop Time 1620   PT Time Calculation (min) 43 min   Activity Tolerance Patient limited by fatigue   Behavior During Therapy [facility] for tasks assessed/performed      Past Medical History:  Diagnosis Date  . Anemia   . Dysrhythmia    pvc's holter monitor started lopressor- no return visit  . GERD (gastroesophageal reflux disease)   . Headache(784.0)   . Heart murmur    child no echo  . History of kidney stones   . Hypertension   . IBS (irritable bowel syndrome)   . Low vitamin D level   . Lower extremity weakness   . Neuromuscular disorder ([place])   . Numbness and tingling   . Osteoarthritis   . Pneumonia    January- tx with IV antibiotics  for pneumonia in 04/2015   . PVC (premature ventricular contraction)   . Seizure disorder ([place])    denied seizures- has dyskinesia  . Sleep concern 2013   toldf that she doesn 't have apnea but she stays sleeping a lot  . Upper extremity weakness     Past Surgical History:  Procedure Laterality Date  . 2 ovarian cysts     age 50  . CERVICAL DISC ARTHROPLASTY N/A 10/23/2015   Procedure: Cervical Disc Arthroplasty Cervical six-seven;  Surgeon: Eustace Moore, MD;  Location: [facility];  Service: Neurosurgery;  Laterality: N/A;  . ESOPHAGOGASTRODUODENOSCOPY     for GERD  . LAPAROSCOPIC ABDOMINAL EXPLORATION     dx ibs  . TUBAL LIGATION      There were no  vitals filed for this visit.      Subjective Assessment - 09/30/16 1539    Subjective No issues to report, everything is about the same.  Is having pain in shoulders and neck from trying to keep neck straight.   Patient is accompained by: Family member   Limitations Sitting;Lifting;Standing;Walking;House hold activities   How long can you sit comfortably? 2 hrs prior to onset of pain    Patient Stated Goals sitting upright, walking, driving, cooking, cleaning, return to work, walk the dog    Currently in Pain? Yes   Pain Score 4    Pain Location Neck   Pain Orientation Right;Left   Pain Descriptors / Indicators Sore;Aching   Pain Type Acute pain                         [facility] Adult PT Treatment/Exercise - 09/30/16 2101      Ambulation/Gait   Ambulation/Gait Yes   Ambulation/Gait Assistance 2: Max assist  w/c follow   Ambulation/Gait Assistance Details Performed gait with UP [place] to provide pt with increased UE support for increased trunk and proximal stability to improve motor control of LE; performed with air cast on R ankle for support.  First  walk pt only able to ambulate 25' due to significant LE ataxia, stomping and decreased motor control.  Placed 5lb ankle weights on each LE for increased proprioceptive input and to assist with increased control.  Second walk pt able to ambulate 39' but continued to require therapist assistance to stabilize pelvis and clear feet during swing phase.  Pt demonstrated decreased ankle instability with air cast and decreased stomping with weights donned.  Pt also required verbal and tactile cues to maintain upright trunk while performing gait.     Ambulation Distance (Feet) 25 Feet  + 50 feet second repetition   Assistive device Eva [place];Other (Comment)  UP [place] with forearm support   Gait Pattern Ataxic   Ambulation Surface Level;Indoor     Self-Care   Self-Care Other Self-Care Comments   Other Self-Care Comments  educated  husband on use of AIR CAST for R ankle stability and how to purchase.  Also discussed with pt and husband beginning to take away some support of w/c gradually to allow increased use of postural mm; advised to remove headrest x 1 hour starting tomorrow and gradually increase by 15-30 min each day until not using headrest at all when up in w/c.  Discussed transitioning to regular manual w/c without high back recliner; pt and husband agreeable to plan.       Neuro Re-ed    Neuro Re-ed Details  NMR in tall kneeling with UE support on bench performing alternating UE flexion/lifts x 10, bilat UE lifts x 10, tall kneeling mini squats and lateral weight shifting for proximal mm activation and stability.                  PT Education - 09/30/16 2114    Education provided Yes   Education Details how to purchase air cast for R foot; progression of removing head rest from w/c >> regular manual w/c to increase trunk activation when sitting in w/c   Person(s) Educated Patient;Spouse   Methods Explanation;Handout   Comprehension Verbalized understanding          PT Short Term Goals - 09/19/16 1610      PT SHORT TERM GOAL #1   Title Patient will verbalize understanding and return demonstration for initial HEP to improve LE strength and balance. (TARGET DATE: 10/21/2016)    Time 4   Period Weeks   Status New     PT SHORT TERM GOAL #2   Title Patient will be able to safely maintain upright posture sitting edge of bed with min A for 1 minute to indicate improvement in trunk control. (TARGET DATE: 10/21/2016)    Time 4   Period Weeks   Status New     PT SHORT TERM GOAL #3   Title Patient will demonstrate ability to transfer from w/c to mat with min A to indicate improvement in functional mobility and a decrease in her risk of falling. (TARGET DATE: 10/21/2016)    Time 4   Period Weeks   Status New     PT SHORT TERM GOAL #4   Title Patient will demonstrate ability to maintain static standing  for 30s with BUE support on RW and min assist to indicate a decrease in risk of falling. (TARGET DATE: 10/21/2016)    Time 4   Period Weeks   Status New           PT Long Term Goals - 09/19/16 1601      PT LONG TERM GOAL #1  Title Patient will verbalize understanding and return demonstration for ongoing HEP. (TARGET DATE: 11/25/2016)    Time 10   Period Weeks   Status New     PT LONG TERM GOAL #2   Title Patient will demonstrate ability to safely maintain trunk control sitting balance static and reaches 6" modified indepedent to indicate a decrease in her risk of falling. (TARGET DATE: 11/25/2016)    Time 10   Period Weeks   Status New     PT LONG TERM GOAL #3   Title Patient will be able to transfer w/c to mat modified independent to indicate a decrease in her risk of falling during transfers. (TARGET DATE: 11/25/2016)    Time 10   Period Weeks   Status New     PT LONG TERM GOAL #4   Title Patient will demonstrate ability to climb stairs (1 rail) and min A to indicate a decreae in caregiver burden and a decrease in her risk of falling when climbing the stairs to get to the bedroom and bathroom at home. (TARGET DATE: 11/25/2016)    Time 10   Period Weeks   Status New     PT LONG TERM GOAL #5   Title Patient will demonstrate ability to ambulate 79ft with LRAD and min A to indicate improvement in ability to ambulate household distances with assistance. (TARGET DATE: 11/25/2016)    Time 10   Period Weeks   Status New     Additional Long Term Goals   Additional Long Term Goals Yes     PT LONG TERM GOAL #6   Title Patient will improvem FOTO score by >/= 10% to indicate improvement in self-perceived functional mobility. (TARGET DATE: 11/25/2016)    Time [place] - 09/30/16 2116    Clinical Impression Statement Treatment session today with focus on NMR in tall kneeling position for increased proximal stability activation, trunk  control and weight shifting.  Initiated gait training with UP [place] with forearm support for increased upright trunk, trunk control to assist with motor control of stepping sequence. Continued to require use of weights to assist with control of ataxic LE.  Pt pleased with walking but very fatigued at the end of the session.  Will continue to progress towards LTG.   Rehab Potential Good   Clinical Impairments Affecting Rehab Potential HTN, cervical spondylosis, C6-7 L side disc protrusion causing L foraminal impingement, anemia, functional neurological disorder, anemia     PT Frequency 2x / week   PT Duration Other (comment)  10 weeks    PT Treatment/Interventions ADLs/Self Care Home Management;Electrical Stimulation;Neuromuscular re-education;Balance training;Therapeutic exercise;Therapeutic activities;Functional mobility training;Stair training;Gait training;DME Instruction;Patient/family education;Energy conservation   PT Next Visit Plan Make sure they have been removing head rest at home to increased head control; next will be changing to regular manual w/c; use air cast for R ankle support; supine PNF, NMR for trunk control with dynamic UE and LE movement; coordination; progress gait training with UP [place]-have w/c follow, utilize graded movement to foster proper motor control.  May have to use weights on extremeties and all 4 extremeties in closed chain for increased proprioception and control   Recommended Other Services OT referral has been requested; awaiting entry by Dr. Allie Bossier and Agree with Plan of Care Patient;Family member/caregiver   Family Member Consulted husband  Patient will benefit from skilled therapeutic intervention in order to improve the following deficits and impairments:  Decreased activity tolerance, Decreased balance, Decreased mobility, Decreased knowledge of use of DME, Decreased endurance, Decreased strength, Impaired flexibility, Impaired tone  Visit  Diagnosis: Unsteadiness on feet  Muscle weakness (generalized)  History of falling  Other abnormalities of gait and mobility  Other symptoms and signs involving the nervous system     Problem List Patient Active Problem List   Diagnosis Date Noted  . Abnormal laboratory test 09/09/2016  . Gait abnormality 08/03/2016  . Weakness 08/03/2016  . Paresthesia 08/03/2016  . S/P cervical spinal fusion 10/23/2015  . Hypersomnia 12/08/2010  . 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  . EPIGASTRIC PAIN 10/13/2009  . IRRITABLE BOWEL SYNDROME 03/10/2009  . BACK PAIN, LUMBAR 07/01/2008  . COSTOCHONDRITIS 02/01/2008  . VIRAL URI 01/08/2008  . CHEST PAIN 01/08/2008  . 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 ARM PAIN 12/25/2007  . ANEMIA-NOS 10/02/2007  . Essential hypertension 10/02/2007  . GERD 10/02/2007  . POLYP, GALLBLADDER 10/02/2007  . UTERINE POLYP 10/02/2007  . OSTEOARTHRITIS 10/02/2007  . Dyskinesia 10/02/2007  . HEADACHE 10/02/2007  . PALPITATIONS 10/02/2007  . CARDIAC MURMUR, HX OF 10/02/2007  . NEPHROLITHIASIS, HX OF 10/02/2007    Raylene Everts, PT, DPT 09/30/16    9:23 PM    [facility] [address] [place], [place], [address] Phone: [phone]   Fax:  [phone]  Name: ARMANDA FORAND MRN: [id number] Date of Birth: 01/07/1971

## 2016-10-03 ENCOUNTER — Ambulatory Visit: Payer: 59 | Attending: Family Medicine | Admitting: Physical Therapy

## 2016-10-03 ENCOUNTER — Encounter: Payer: Self-pay | Admitting: Physical Therapy

## 2016-10-03 DIAGNOSIS — R29818 Other symptoms and signs involving the nervous system: Secondary | ICD-10-CM | POA: Insufficient documentation

## 2016-10-03 DIAGNOSIS — M6281 Muscle weakness (generalized): Secondary | ICD-10-CM

## 2016-10-03 DIAGNOSIS — R2681 Unsteadiness on feet: Secondary | ICD-10-CM | POA: Diagnosis not present

## 2016-10-03 DIAGNOSIS — R2689 Other abnormalities of gait and mobility: Secondary | ICD-10-CM | POA: Diagnosis present

## 2016-10-03 DIAGNOSIS — Z9181 History of falling: Secondary | ICD-10-CM | POA: Insufficient documentation

## 2016-10-03 NOTE — Therapy (Signed)
[facility] [address] [place], [place], [address] Phone: [phone]   Fax:  [phone]  Physical Therapy Treatment  Patient Details  Name: Amy Barry MRN: [id number] Date of Birth: [birth year] Referring Provider: Donald Prose MD   Encounter Date: 10/03/2016      PT End of Session - 10/03/16 2129    Visit Number 5   Number of Visits 20   Date for PT Re-Evaluation 11/25/16   Authorization Type [facility] - Visit Number 4   Authorization - Number of Visits 20   PT Start Time 1401   PT Stop Time 1446   PT Time Calculation (min) 45 min   Equipment Utilized During Treatment Gait belt;Other (comment)  Rt air cast   Activity Tolerance Patient limited by fatigue   Behavior During Therapy [facility] for tasks assessed/performed      Past Medical History:  Diagnosis Date  . Anemia   . Dysrhythmia    pvc's holter monitor started lopressor- no return visit  . GERD (gastroesophageal reflux disease)   . Headache(784.0)   . Heart murmur    child no echo  . History of kidney stones   . Hypertension   . IBS (irritable bowel syndrome)   . Low vitamin D level   . Lower extremity weakness   . Neuromuscular disorder ([place])   . Numbness and tingling   . Osteoarthritis   . Pneumonia    January- tx with IV antibiotics  for pneumonia in 04/2015   . PVC (premature ventricular contraction)   . Seizure disorder ([place])    denied seizures- has dyskinesia  . Sleep concern 2013   toldf that she doesn 't have apnea but she stays sleeping a lot  . Upper extremity weakness     Past Surgical History:  Procedure Laterality Date  . 2 ovarian cysts     age 16  . CERVICAL DISC ARTHROPLASTY N/A 10/23/2015   Procedure: Cervical Disc Arthroplasty Cervical six-seven;  Surgeon: Eustace Moore, MD;  Location: [facility];  Service: Neurosurgery;  Laterality: N/A;  . ESOPHAGOGASTRODUODENOSCOPY     for GERD  . LAPAROSCOPIC  ABDOMINAL EXPLORATION     dx ibs  . TUBAL LIGATION      There were no vitals filed for this visit.      Subjective Assessment - 10/03/16 1400    Subjective (P)  No new issues. Did take the headrest off for at least 2 hours on Sat and 45 minutes on Sunday.    Patient is accompained by: (P)  Family member   Limitations (P)  Sitting;Lifting;Standing;Walking;House hold activities   How long can you sit comfortably? (P)  2 hrs prior to onset of pain    Patient Stated Goals (P)  sitting upright, walking, driving, cooking, cleaning, return to work, walk the dog    Currently in Pain? (P)  Yes   Pain Score (P)  5    Pain Location (P)  Back   Pain Descriptors / Indicators (P)  Aching;Sore   Pain Type (P)  Acute pain                         [facility] Adult PT Treatment/Exercise - 10/03/16 0001      Bed Mobility   Bed Mobility Rolling Right;Rolling Left;Right Sidelying to Sit;Sit to Sidelying Right   Rolling Right 4: Min assist   Rolling Right Details (indicate cue  type and reason) educated in use of head/neck flexion, using bil UEs to create momentum; required assist to keep LE flexed   Rolling Left 4: Min assist   Rolling Left Details (indicate cue type and reason) educated in use of head/neck flexion, using bil UEs to create momentum; required assist to keep LE flexed   Right Sidelying to Sit 3: Mod assist;HOB flat   Sit to Sidelying Right 4: Min assist     Transfers   Transfers Sit to Stand;Stand to Sit;Stand Pivot Transfers   Sit to Stand 4: Min assist;With upper extremity assist   Sit to Stand Details (indicate cue type and reason) vc for sequence   Stand to Sit 4: Min assist;3: Mod assist   Stand to Sit Details incr assist with fatigue   Stand Pivot Transfers 3: Mod assist   Stand Pivot Transfer Details (indicate cue type and reason) for weight shift to allow moving feet to pivot     Ambulation/Gait   Ambulation/Gait Assistance 2: Max assist  +2 to follow with w/c    Ambulation/Gait Assistance Details Performed gait with UP [place] to provide pt with increased UE support for increased trunk and proximal stability to improve motor control of LE; performed with air cast on R ankle for support. Placed 5lb ankle weights on RLE only. Assist to control [place], weight shift for RLe advancement   Ambulation Distance (Feet) 25 Feet   Assistive device Other (Comment)  Upwalker   Gait Pattern Ataxic   Ambulation Surface Level;Indoor     Balance   Balance Assessed Yes     Static Sitting Balance   Static Sitting - Balance Support Bilateral upper extremity supported;Feet unsupported   Static Sitting - Level of Assistance 5: Stand by assistance   Static Sitting - Comment/# of Minutes 3 minutes with donning rt air cast     Exercises   Exercises Knee/Hip     Knee/Hip Exercises: Supine   Bridges AAROM;Strengthening;Both   Bridges Limitations pt holding ~6" ball between her knees, assist to control LE's with pt able to lift one hip at a time towards bridge, able to slightly lift and scoot hips lt or rt in supine     Knee/Hip Exercises: Sidelying   Other Sidelying Knee/Hip Exercises knee extension and flexion in gravity-eliminated position AAROM                PT Education - 10/03/16 2129    Education provided Yes   Education Details continue to work on removing headrest from w/c   [employer]) Educated Patient;Child(ren)   Methods Explanation   Comprehension Verbalized understanding          PT Short Term Goals - 09/19/16 1610      PT SHORT TERM GOAL #1   Title Patient will verbalize understanding and return demonstration for initial HEP to improve LE strength and balance. (TARGET DATE: 10/21/2016)    Time 4   Period Weeks   Status New     PT SHORT TERM GOAL #2   Title Patient will be able to safely maintain upright posture sitting edge of bed with min A for 1 minute to indicate improvement in trunk control. (TARGET DATE: 10/21/2016)    Time 4    Period Weeks   Status New     PT SHORT TERM GOAL #3   Title Patient will demonstrate ability to transfer from w/c to mat with min A to indicate improvement in functional mobility and a decrease in her  risk of falling. (TARGET DATE: 10/21/2016)    Time 4   Period Weeks   Status New     PT SHORT TERM GOAL #4   Title Patient will demonstrate ability to maintain static standing for 30s with BUE support on RW and min assist to indicate a decrease in risk of falling. (TARGET DATE: 10/21/2016)    Time 4   Period Weeks   Status New           PT Long Term Goals - 09/19/16 1601      PT LONG TERM GOAL #1   Title Patient will verbalize understanding and return demonstration for ongoing HEP. (TARGET DATE: 11/25/2016)    Time 10   Period Weeks   Status New     PT LONG TERM GOAL #2   Title Patient will demonstrate ability to safely maintain trunk control sitting balance static and reaches 6" modified indepedent to indicate a decrease in her risk of falling. (TARGET DATE: 11/25/2016)    Time 10   Period Weeks   Status New     PT LONG TERM GOAL #3   Title Patient will be able to transfer w/c to mat modified independent to indicate a decrease in her risk of falling during transfers. (TARGET DATE: 11/25/2016)    Time 10   Period Weeks   Status New     PT LONG TERM GOAL #4   Title Patient will demonstrate ability to climb stairs (1 rail) and min A to indicate a decreae in caregiver burden and a decrease in her risk of falling when climbing the stairs to get to the bedroom and bathroom at home. (TARGET DATE: 11/25/2016)    Time 10   Period Weeks   Status New     PT LONG TERM GOAL #5   Title Patient will demonstrate ability to ambulate 41ft with LRAD and min A to indicate improvement in ability to ambulate household distances with assistance. (TARGET DATE: 11/25/2016)    Time 10   Period Weeks   Status New     Additional Long Term Goals   Additional Long Term Goals Yes     PT LONG TERM  GOAL #6   Title Patient will improvem FOTO score by >/= 10% to indicate improvement in self-perceived functional mobility. (TARGET DATE: 11/25/2016)    Time [place] - 10/03/16 2131    Clinical Impression Statement Session focused on bed mobility, strengthening (gravity eliminated, AAROM), sitting balance, transfers, and ambulation with Upwalker. Patient continues with ataxic movements of Legs and trunk (very little UE ataxia noted). Patient in good spirits and pleased with her progress. Will continue to benefit from PT.    Rehab Potential Good   Clinical Impairments Affecting Rehab Potential HTN, cervical spondylosis, C6-7 L side disc protrusion causing L foraminal impingement, anemia, functional neurological disorder, anemia     PT Frequency 2x / week   PT Duration Other (comment)  10 weeks    PT Treatment/Interventions ADLs/Self Care Home Management;Electrical Stimulation;Neuromuscular re-education;Balance training;Therapeutic exercise;Therapeutic activities;Functional mobility training;Stair training;Gait training;DME Instruction;Patient/family education;Energy conservation   PT Next Visit Plan Make sure they have been removing head rest at home to increased head control; next will be changing to regular manual w/c; use air cast for R ankle support; supine PNF, NMR for trunk control with dynamic UE and LE movement; coordination; progress gait training  with UP [place]-have w/c follow, utilize graded movement to foster proper motor control.  May have to use weights on extremeties and all 4 extremeties in closed chain for increased proprioception and control   Consulted and Agree with Plan of Care Patient;Family member/caregiver   Family Member Consulted daughter      Patient will benefit from skilled therapeutic intervention in order to improve the following deficits and impairments:  Decreased activity tolerance, Decreased balance, Decreased  mobility, Decreased knowledge of use of DME, Decreased endurance, Decreased strength, Impaired flexibility, Impaired tone  Visit Diagnosis: Unsteadiness on feet  Muscle weakness (generalized)  Other abnormalities of gait and mobility  Other symptoms and signs involving the nervous system     Problem List Patient Active Problem List   Diagnosis Date Noted  . Abnormal laboratory test 09/09/2016  . Gait abnormality 08/03/2016  . Weakness 08/03/2016  . Paresthesia 08/03/2016  . S/P cervical spinal fusion 10/23/2015  . Hypersomnia 12/08/2010  . 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  . EPIGASTRIC PAIN 10/13/2009  . IRRITABLE BOWEL SYNDROME 03/10/2009  . BACK PAIN, LUMBAR 07/01/2008  . COSTOCHONDRITIS 02/01/2008  . VIRAL URI 01/08/2008  . CHEST PAIN 01/08/2008  . 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 ARM PAIN 12/25/2007  . ANEMIA-NOS 10/02/2007  . Essential hypertension 10/02/2007  . GERD 10/02/2007  . POLYP, GALLBLADDER 10/02/2007  . UTERINE POLYP 10/02/2007  . OSTEOARTHRITIS 10/02/2007  . Dyskinesia 10/02/2007  . HEADACHE 10/02/2007  . PALPITATIONS 10/02/2007  . CARDIAC MURMUR, HX OF 10/02/2007  . NEPHROLITHIASIS, HX OF 10/02/2007    Amy Barry, PT 10/03/2016, 9:35 PM  [facility] [address] [place], [place], [address] Phone: [phone]   Fax:  [phone]  Name: Amy Barry MRN: [id number] Date of Birth: 07-30-1970

## 2016-10-06 ENCOUNTER — Ambulatory Visit
Admission: RE | Admit: 2016-10-06 | Discharge: 2016-10-06 | Disposition: A | Discharge status: Home / Self Care | Payer: 59 | Source: Ambulatory Visit | Attending: Neurology | Admitting: Neurology

## 2016-10-06 DIAGNOSIS — R899 Unspecified abnormal finding in specimens from other organs, systems and tissues: Secondary | ICD-10-CM

## 2016-10-06 DIAGNOSIS — R531 Weakness: Secondary | ICD-10-CM

## 2016-10-06 DIAGNOSIS — R269 Unspecified abnormalities of gait and mobility: Secondary | ICD-10-CM

## 2016-10-06 DIAGNOSIS — M47816 Spondylosis without myelopathy or radiculopathy, lumbar region: Secondary | ICD-10-CM | POA: Diagnosis not present

## 2016-10-06 DIAGNOSIS — Z981 Arthrodesis status: Secondary | ICD-10-CM

## 2016-10-07 ENCOUNTER — Encounter: Payer: Self-pay | Admitting: Physical Therapy

## 2016-10-07 ENCOUNTER — Ambulatory Visit: Payer: 59 | Admitting: Physical Therapy

## 2016-10-07 DIAGNOSIS — R2681 Unsteadiness on feet: Secondary | ICD-10-CM | POA: Diagnosis not present

## 2016-10-07 DIAGNOSIS — R29818 Other symptoms and signs involving the nervous system: Secondary | ICD-10-CM

## 2016-10-07 DIAGNOSIS — R2689 Other abnormalities of gait and mobility: Secondary | ICD-10-CM

## 2016-10-07 NOTE — Therapy (Signed)
[facility] [address] [place], [place], [address] Phone: [phone]   Fax:  [phone]  Physical Therapy Treatment  Patient Details  Name: Amy Barry MRN: [id number] Date of Birth: [birth year] Referring Provider: Donald Prose MD   Encounter Date: 10/07/2016      PT End of Session - 10/07/16 1649    Visit Number 6   Number of Visits 20   Date for PT Re-Evaluation 11/25/16   Authorization Type [place] - Visit Number 6   Authorization - Number of Visits 20   PT Start Time 1550   PT Stop Time 9983   PT Time Calculation (min) 25 min   Equipment Utilized During Treatment Gait belt;Other (comment)  Rt air cast   Activity Tolerance Patient tolerated treatment well   Behavior During Therapy WFL for tasks assessed/performed      Past Medical History:  Diagnosis Date  . Anemia   . Dysrhythmia    pvc's holter monitor started lopressor- no return visit  . GERD (gastroesophageal reflux disease)   . Headache(784.0)   . Heart murmur    child no echo  . History of kidney stones   . Hypertension   . IBS (irritable bowel syndrome)   . Low vitamin D level   . Lower extremity weakness   . Neuromuscular disorder ([place])   . Numbness and tingling   . Osteoarthritis   . Pneumonia    January- tx with IV antibiotics  for pneumonia in 04/2015   . PVC (premature ventricular contraction)   . Seizure disorder ([place])    denied seizures- has dyskinesia  . Sleep concern 2013   toldf that she doesn 't have apnea but she stays sleeping a lot  . Upper extremity weakness     Past Surgical History:  Procedure Laterality Date  . 2 ovarian cysts     age 55  . CERVICAL DISC ARTHROPLASTY N/A 10/23/2015   Procedure: Cervical Disc Arthroplasty Cervical six-seven;  Surgeon: Eustace Moore, MD;  Location: [facility];  Service: Neurosurgery;  Laterality: N/A;  . ESOPHAGOGASTRODUODENOSCOPY     for GERD  .  LAPAROSCOPIC ABDOMINAL EXPLORATION     dx ibs  . TUBAL LIGATION      There were no vitals filed for this visit.      Subjective Assessment - 10/07/16 1551    Subjective No new issues. Bed mobility is getting easier. Hurt all day yesterday without headrest.    Patient is accompained by: Family member   Limitations Sitting;Lifting;Standing;Walking;House hold activities   How long can you sit comfortably? 2 hrs prior to onset of pain    Patient Stated Goals sitting upright, walking, driving, cooking, cleaning, return to work, walk the dog    Currently in Pain? Yes   Pain Score 6    Pain Location Neck   Pain Orientation Right   Pain Descriptors / Indicators Aching   Pain Type Acute pain   Pain Onset In the past 7 days   Pain Frequency Constant                         [facility] Adult PT Treatment/Exercise - 10/07/16 1636      Transfers   Transfers Sit to Stand;Stand to Sit   Sit to Stand 4: Min assist;With upper extremity assist;From elevated surface  20" w/c with additional cushion (~22")   Sit to Stand Details (indicate  cue type and reason) vc for sequence; incr time for pt to get legs set in position (to get legs to stop moving/stomping)   Stand to Sit 4: Min guard;To elevated surface;With upper extremity assist     Ambulation/Gait   Ambulation/Gait Assistance 3: Mod assist;2: Max assist  +2 for w/c follow   Ambulation/Gait Assistance Details with vc for upright posture, assist to incr forward motion/speed of [place]; front wheels of [place] locked to not swivel   Ambulation Distance (Feet) 50 Feet  x3   Assistive device Other (Comment)  upwalker   Gait Pattern Ataxic   Ambulation Surface Level;Indoor     Chief Technology Officer Yes   Wheelchair Assistance 6: Modified independent (Device/Increase time)   Environmental health practitioner Both upper extremities  feet cannot reach the floor    Wheelchair Parts Management Supervision/cueing  unlocked  footrests with cues; independent with brakes   Distance 100   Comments current chair 16"wide x 18"deep x 20"high; discussed with pt/husband need to call equipment company and trade out w/c for one that fits her (rec [phone])                PT Education - 10/07/16 1648    Education provided Yes   Education Details place full length mirror at end of area where she walks with husband's assist to allow looking up and able to see foot placement; see w/c section re: recommendations   Person(s) Educated Patient;Spouse   Methods Explanation   Comprehension Verbalized understanding          PT Short Term Goals - 09/19/16 1610      PT SHORT TERM GOAL #1   Title Patient will verbalize understanding and return demonstration for initial HEP to improve LE strength and balance. (TARGET DATE: 10/21/2016)    Time 4   Period Weeks   Status New     PT SHORT TERM GOAL #2   Title Patient will be able to safely maintain upright posture sitting edge of bed with min A for 1 minute to indicate improvement in trunk control. (TARGET DATE: 10/21/2016)    Time 4   Period Weeks   Status New     PT SHORT TERM GOAL #3   Title Patient will demonstrate ability to transfer from w/c to mat with min A to indicate improvement in functional mobility and a decrease in her risk of falling. (TARGET DATE: 10/21/2016)    Time 4   Period Weeks   Status New     PT SHORT TERM GOAL #4   Title Patient will demonstrate ability to maintain static standing for 30s with BUE support on RW and min assist to indicate a decrease in risk of falling. (TARGET DATE: 10/21/2016)    Time 4   Period Weeks   Status New           PT Long Term Goals - 09/19/16 1601      PT LONG TERM GOAL #1   Title Patient will verbalize understanding and return demonstration for ongoing HEP. (TARGET DATE: 11/25/2016)    Time 10   Period Weeks   Status New     PT LONG TERM GOAL #2   Title Patient will demonstrate ability to safely  maintain trunk control sitting balance static and reaches 6" modified indepedent to indicate a decrease in her risk of falling. (TARGET DATE: 11/25/2016)    Time 10   Period Weeks   Status New  PT LONG TERM GOAL #3   Title Patient will be able to transfer w/c to mat modified independent to indicate a decrease in her risk of falling during transfers. (TARGET DATE: 11/25/2016)    Time 10   Period Weeks   Status New     PT LONG TERM GOAL #4   Title Patient will demonstrate ability to climb stairs (1 rail) and min A to indicate a decreae in caregiver burden and a decrease in her risk of falling when climbing the stairs to get to the bedroom and bathroom at home. (TARGET DATE: 11/25/2016)    Time 10   Period Weeks   Status New     PT LONG TERM GOAL #5   Title Patient will demonstrate ability to ambulate 63ft with LRAD and min A to indicate improvement in ability to ambulate household distances with assistance. (TARGET DATE: 11/25/2016)    Time 10   Period Weeks   Status New     Additional Long Term Goals   Additional Long Term Goals Yes     PT LONG TERM GOAL #6   Title Patient will improvem FOTO score by >/= 10% to indicate improvement in self-perceived functional mobility. (TARGET DATE: 11/25/2016)    Time 10   Period Weeks   Status New               Plan - 10/07/16 1651    Clinical Impression Statement Patient arrived 20 minutes late for session. Focused on transfers and gait training with Upwalker. Patient's control and fluidity of movements improved with PT pushing [place] to assist her to incr velocity. Continues with RLE ataxia worse than LLE. 2nd person for safety to follow with w/c.    Rehab Potential Good   Clinical Impairments Affecting Rehab Potential HTN, cervical spondylosis, C6-7 L side disc protrusion causing L foraminal impingement, anemia, functional neurological disorder, anemia     PT Frequency 2x / week   PT Duration Other (comment)  10 weeks    PT  Treatment/Interventions ADLs/Self Care Home Management;Electrical Stimulation;Neuromuscular re-education;Balance training;Therapeutic exercise;Therapeutic activities;Functional mobility training;Stair training;Gait training;DME Instruction;Patient/family education;Energy conservation   PT Next Visit Plan See if they have made progress on different w/c; use air cast for R ankle support; coordination; progress gait training with UP [place] vs reg RW-have w/c follow, utilize graded movement to foster proper motor control.  May have to use weights on extremeties and all 4 extremeties in closed chain for increased proprioception and control?try quadruped   Consulted and Agree with Plan of Care Patient;Family member/caregiver   Family Member Consulted husband      Patient will benefit from skilled therapeutic intervention in order to improve the following deficits and impairments:  Decreased activity tolerance, Decreased balance, Decreased mobility, Decreased knowledge of use of DME, Decreased endurance, Decreased strength, Impaired flexibility, Impaired tone  Visit Diagnosis: Unsteadiness on feet  Other abnormalities of gait and mobility  Other symptoms and signs involving the nervous system     Problem List Patient Active Problem List   Diagnosis Date Noted  . Abnormal laboratory test 09/09/2016  . Gait abnormality 08/03/2016  . Weakness 08/03/2016  . Paresthesia 08/03/2016  . S/P cervical spinal fusion 10/23/2015  . Hypersomnia 12/08/2010  . 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  . EPIGASTRIC PAIN 10/13/2009  . IRRITABLE BOWEL SYNDROME 03/10/2009  . BACK PAIN, LUMBAR 07/01/2008  . COSTOCHONDRITIS 02/01/2008  . VIRAL URI 01/08/2008  . CHEST PAIN 01/08/2008  . 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  ARM PAIN 12/25/2007  . ANEMIA-NOS 10/02/2007  . Essential hypertension 10/02/2007  . GERD 10/02/2007  . POLYP, GALLBLADDER 10/02/2007  . UTERINE POLYP 10/02/2007  .  OSTEOARTHRITIS 10/02/2007  . Dyskinesia 10/02/2007  . HEADACHE 10/02/2007  . PALPITATIONS 10/02/2007  . CARDIAC MURMUR, HX OF 10/02/2007  . NEPHROLITHIASIS, HX OF 10/02/2007    Rexanne Mano, PT 10/07/2016, 4:58 PM  [facility] [address] [place], [place], [address] Phone: [phone]   Fax:  [phone]  Name: Amy Barry MRN: [id number] Date of Birth: 07/23/1970

## 2016-10-10 ENCOUNTER — Ambulatory Visit: Payer: 59 | Admitting: Adult Health

## 2016-10-10 NOTE — Progress Notes (Signed)
Lumbar spine MRI without contrast showed mild degenerative changes throughout the lumbar spine, most prominent at L5-S1, where there are bilateral nerve root canal narrowing which could cause pinched nerve type changes and Sx. It may be worthwhile discussing this with her neurosurgeon, Dr. Sherley Bounds as well. Overall, no serious findings, no urgent surgical referral required. Please notify patient. Amy Age, MD, PhD [facility])

## 2016-10-11 ENCOUNTER — Ambulatory Visit: Payer: 59 | Admitting: Physical Therapy

## 2016-10-11 ENCOUNTER — Encounter: Payer: Self-pay | Admitting: Physical Therapy

## 2016-10-11 DIAGNOSIS — R2681 Unsteadiness on feet: Secondary | ICD-10-CM

## 2016-10-11 DIAGNOSIS — M6281 Muscle weakness (generalized): Secondary | ICD-10-CM

## 2016-10-11 DIAGNOSIS — R2689 Other abnormalities of gait and mobility: Secondary | ICD-10-CM

## 2016-10-11 NOTE — Therapy (Signed)
[facility] [address] [place], [place], [address] Phone: [phone]   Fax:  [phone]  Physical Therapy Treatment  Patient Details  Name: Amy Barry MRN: [id number] Date of Birth: 11-Oct-1970 Referring Provider: Donald Prose MD   Encounter Date: 10/11/2016      PT End of Session - 10/11/16 1550    Visit Number 7   Number of Visits 20   Date for PT Re-Evaluation 11/25/16   Authorization Type [place] - Visit Number 7   Authorization - Number of Visits 20   PT Start Time 2409   PT Stop Time 1550   PT Time Calculation (min) 63 min   Equipment Utilized During Treatment Gait belt;Other (comment)  Rt air cast   Activity Tolerance Patient tolerated treatment well   Behavior During Therapy WFL for tasks assessed/performed      Past Medical History:  Diagnosis Date  . Anemia   . Dysrhythmia    pvc's holter monitor started lopressor- no return visit  . GERD (gastroesophageal reflux disease)   . Headache(784.0)   . Heart murmur    child no echo  . History of kidney stones   . Hypertension   . IBS (irritable bowel syndrome)   . Low vitamin D level   . Lower extremity weakness   . Neuromuscular disorder ([place])   . Numbness and tingling   . Osteoarthritis   . Pneumonia    January- tx with IV antibiotics  for pneumonia in 04/2015   . PVC (premature ventricular contraction)   . Seizure disorder ([place])    denied seizures- has dyskinesia  . Sleep concern 2013   toldf that she doesn 't have apnea but she stays sleeping a lot  . Upper extremity weakness     Past Surgical History:  Procedure Laterality Date  . 2 ovarian cysts     age 12  . CERVICAL DISC ARTHROPLASTY N/A 10/23/2015   Procedure: Cervical Disc Arthroplasty Cervical six-seven;  Surgeon: Eustace Moore, MD;  Location: [facility];  Service: Neurosurgery;  Laterality: N/A;  . ESOPHAGOGASTRODUODENOSCOPY     for GERD  .  LAPAROSCOPIC ABDOMINAL EXPLORATION     dx ibs  . TUBAL LIGATION      There were no vitals filed for this visit.      Subjective Assessment - 10/11/16 1450    Subjective Patient reports that she comes downstairs at approximately 10a-11a, and stays downstairs in her wheelchair until bedtime. Patient presents to PT in new wheelchair without head control. Patient denies any changes or falls.    Patient is accompained by: Family member   Limitations Sitting;Lifting;Standing;Walking;House hold activities   How long can you sit comfortably? 2 hrs prior to onset of pain    Patient Stated Goals sitting upright, walking, driving, cooking, cleaning, return to work, walk the dog    Currently in Pain? Yes   Pain Score 6    Pain Location Leg  "from the waist down"    Pain Orientation Right;Left   Pain Descriptors / Indicators Aching   Pain Onset In the past 7 days   Aggravating Factors  pain is worse at night    Pain Score 6   Pain Location Neck   Pain Descriptors / Indicators Aching   Pain Frequency Constant                         [facility]  Adult PT Treatment/Exercise - 10/11/16 1445      Transfers   Transfers Sit to Stand;Stand to Sit   Sit to Stand 4: Min assist;With upper extremity assist;From elevated surface   Sit to Stand Details (indicate cue type and reason) Requires cueing for forward (first), then upward weight shift    Stand to Sit 4: Min guard;To elevated surface;With upper extremity assist   Stand to Sit Details Requires cueing to "bow" during descent to reach for armrest    Squat Pivot Transfers 3: Mod assist;From elevated surface;With upper extremity assistance   Squat Pivot Transfer Details (indicate cue type and reason) Transferred from w/c <> NuStep seat (elevated surface) requiring step-by-step cueing for hand and foot placement and weight shift (emphasis on fwd weight shift).     Ambulation/Gait   Ambulation/Gait Yes   Ambulation/Gait Assistance 3: Mod  assist;2: Max assist  +2 for w/c follow   Ambulation/Gait Assistance Details Requires mod A - max A (2 person assist with PT) or use of body weight support  system/Pacer. Requires cueing for weight shift and sequencing of stepping. Utilized R aircast throughout all ambulation activities.   Ambulation Distance (Feet) 35 Feet  25 with Upwalker; 16 with pacer; 35 with body weight support   Assistive device Other (Comment)  upwalker; aircast on R ankle    Gait Pattern Ataxic   Ambulation Surface Level;Indoor     Diplomatic Services operational officer --   Environmental health practitioner --   Wheelchair Parts Management --   Distance --     Therapeutic Activites    Therapeutic Activities Other Therapeutic Activities   Other Therapeutic Activities PT demonstrated sidelying positioning with pillows for sleeping to help decrease pain that patient reports is high at night.      Neuro Re-ed    Neuro Re-ed Details  Performed seated trunk control exercises on stool. Performed fwd/backward lean followed by a return to upright posture/sitting on stool. Requires mod A - max A. Performed fwd reaching for large ball. Progressed to fwd reaching and trunk rotation to retrieve ball (performed to both sides). Required mod A - max A for trunk/head support. Performed static standing balance with BUE support + trunk support via Pacer or body weight support system. Requires cueing for posture and equal weight bearing in BLEs.     Knee/Hip Exercises: Aerobic   Nustep Requires tactile cueing for head righting. Set arms of NuStep at 7, seat at 6, resistance at 3. Performed exercise for 5 minutes.                PT Education - 10/11/16 1549    Education provided Yes   Education Details positioning to decrease pain when sleeping in sidelying position    Person(s) Educated Patient;Child(ren)   Methods Explanation;Demonstration   Comprehension Verbalized understanding          PT  Short Term Goals - 09/19/16 1610      PT SHORT TERM GOAL #1   Title Patient will verbalize understanding and return demonstration for initial HEP to improve LE strength and balance. (TARGET DATE: 10/21/2016)    Time 4   Period Weeks   Status New     PT SHORT TERM GOAL #2   Title Patient will be able to safely maintain upright posture sitting edge of bed with min A for 1 minute to indicate improvement in trunk control. (TARGET DATE: 10/21/2016)    Time 4   Period [place]  Status New     PT SHORT TERM GOAL #3   Title Patient will demonstrate ability to transfer from w/c to mat with min A to indicate improvement in functional mobility and a decrease in her risk of falling. (TARGET DATE: 10/21/2016)    Time 4   Period Weeks   Status New     PT SHORT TERM GOAL #4   Title Patient will demonstrate ability to maintain static standing for 30s with BUE support on RW and min assist to indicate a decrease in risk of falling. (TARGET DATE: 10/21/2016)    Time 4   Period Weeks   Status New           PT Long Term Goals - 09/19/16 1601      PT LONG TERM GOAL #1   Title Patient will verbalize understanding and return demonstration for ongoing HEP. (TARGET DATE: 11/25/2016)    Time 10   Period Weeks   Status New     PT LONG TERM GOAL #2   Title Patient will demonstrate ability to safely maintain trunk control sitting balance static and reaches 6" modified indepedent to indicate a decrease in her risk of falling. (TARGET DATE: 11/25/2016)    Time 10   Period Weeks   Status New     PT LONG TERM GOAL #3   Title Patient will be able to transfer w/c to mat modified independent to indicate a decrease in her risk of falling during transfers. (TARGET DATE: 11/25/2016)    Time 10   Period Weeks   Status New     PT LONG TERM GOAL #4   Title Patient will demonstrate ability to climb stairs (1 rail) and min A to indicate a decreae in caregiver burden and a decrease in her risk of falling when climbing  the stairs to get to the bedroom and bathroom at home. (TARGET DATE: 11/25/2016)    Time 10   Period Weeks   Status New     PT LONG TERM GOAL #5   Title Patient will demonstrate ability to ambulate 8ft with LRAD and min A to indicate improvement in ability to ambulate household distances with assistance. (TARGET DATE: 11/25/2016)    Time 10   Period Weeks   Status New     Additional Long Term Goals   Additional Long Term Goals Yes     PT LONG TERM GOAL #6   Title Patient will improvem FOTO score by >/= 10% to indicate improvement in self-perceived functional mobility. (TARGET DATE: 11/25/2016)    Time 10   Period Weeks   Status New               Plan - 10/11/16 1556    Clinical Impression Statement Today's skilled PT session focused on progressing patient's ambulation by trying both the Pacer and body weight support system in addition to the [place], which has been used in previous sessions. PT noted improvement in R ankle control when ambulating with the Pacer, but patient strongly pushed posteriorly making forward advancement in gait challenging. PT progressed patient's balance activities including trunk control performed on a stool and static standing with BUE support. Patient is making progress towards goals, and will benefit from continued skilled PT to address functional mobility deficits.    Rehab Potential Good   Clinical Impairments Affecting Rehab Potential HTN, cervical spondylosis, C6-7 L side disc protrusion causing L foraminal impingement, anemia, functional neurological disorder, anemia     PT Frequency 2x /  week   PT Duration Other (comment)  10 weeks    PT Treatment/Interventions ADLs/Self Care Home Management;Electrical Stimulation;Neuromuscular re-education;Balance training;Therapeutic exercise;Therapeutic activities;Functional mobility training;Stair training;Gait training;DME Instruction;Patient/family education;Energy conservation   PT Next Visit Plan  progress gait training with w/c follow; use air cast for R ankle support (inflate aircast - check if patient found straw for inflation); progress balance activities with emphasis on trunk control to maintain upright posture; utilize graded movement to foster proper motor control.  May have to use weights on extremeties and all 4 extremeties in closed chain for increased proprioception and control?try quadruped   Consulted and Agree with Plan of Care Patient;Family member/caregiver   Family Member Consulted daughter       Patient will benefit from skilled therapeutic intervention in order to improve the following deficits and impairments:  Decreased activity tolerance, Decreased balance, Decreased mobility, Decreased knowledge of use of DME, Decreased endurance, Decreased strength, Impaired flexibility, Impaired tone  Visit Diagnosis: Unsteadiness on feet  Other abnormalities of gait and mobility  Muscle weakness (generalized)     Problem List Patient Active Problem List   Diagnosis Date Noted  . Abnormal laboratory test 09/09/2016  . Gait abnormality 08/03/2016  . Weakness 08/03/2016  . Paresthesia 08/03/2016  . S/P cervical spinal fusion 10/23/2015  . Hypersomnia 12/08/2010  . 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  . EPIGASTRIC PAIN 10/13/2009  . IRRITABLE BOWEL SYNDROME 03/10/2009  . BACK PAIN, LUMBAR 07/01/2008  . COSTOCHONDRITIS 02/01/2008  . VIRAL URI 01/08/2008  . CHEST PAIN 01/08/2008  . 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 ARM PAIN 12/25/2007  . ANEMIA-NOS 10/02/2007  . Essential hypertension 10/02/2007  . GERD 10/02/2007  . POLYP, GALLBLADDER 10/02/2007  . UTERINE POLYP 10/02/2007  . OSTEOARTHRITIS 10/02/2007  . Dyskinesia 10/02/2007  . HEADACHE 10/02/2007  . PALPITATIONS 10/02/2007  . CARDIAC MURMUR, HX OF 10/02/2007  . NEPHROLITHIASIS, HX OF 10/02/2007    Arelia Sneddon, SPT  10/11/2016, 4:00 PM  [facility] [address] [place], [place], [address] Phone: [phone]   Fax:  [phone]  Name: ZYANYA GLAZA MRN: [id number] Date of Birth: 1970/11/09

## 2016-10-14 ENCOUNTER — Ambulatory Visit: Payer: 59 | Admitting: Physical Therapy

## 2016-10-14 ENCOUNTER — Encounter: Payer: Self-pay | Admitting: Physical Therapy

## 2016-10-14 DIAGNOSIS — R2681 Unsteadiness on feet: Secondary | ICD-10-CM | POA: Diagnosis not present

## 2016-10-14 DIAGNOSIS — R29818 Other symptoms and signs involving the nervous system: Secondary | ICD-10-CM

## 2016-10-14 DIAGNOSIS — M6281 Muscle weakness (generalized): Secondary | ICD-10-CM

## 2016-10-14 DIAGNOSIS — R2689 Other abnormalities of gait and mobility: Secondary | ICD-10-CM

## 2016-10-14 NOTE — Therapy (Signed)
[facility] [address] [place], [place], [address] Phone: [phone]   Fax:  [phone]  Physical Therapy Treatment  Patient Details  Name: Amy Barry MRN: [id number] Date of Birth: 1970-04-15 Referring Provider: Donald Prose MD   Encounter Date: 10/14/2016      PT End of Session - 10/14/16 1638    Visit Number 8   Number of Visits 20   Date for PT Re-Evaluation 11/25/16   Authorization Type [place] - Visit Number 8   Authorization - Number of Visits 20   PT Start Time 3716   PT Stop Time 9678   PT Time Calculation (min) 58 min   Equipment Utilized During Treatment Gait belt;Other (comment)  Rt air cast   Activity Tolerance Patient tolerated treatment well   Behavior During Therapy WFL for tasks assessed/performed      Past Medical History:  Diagnosis Date  . Anemia   . Dysrhythmia    pvc's holter monitor started lopressor- no return visit  . GERD (gastroesophageal reflux disease)   . Headache(784.0)   . Heart murmur    child no echo  . History of kidney stones   . Hypertension   . IBS (irritable bowel syndrome)   . Low vitamin D level   . Lower extremity weakness   . Neuromuscular disorder ([place])   . Numbness and tingling   . Osteoarthritis   . Pneumonia    January- tx with IV antibiotics  for pneumonia in 04/2015   . PVC (premature ventricular contraction)   . Seizure disorder ([place])    denied seizures- has dyskinesia  . Sleep concern 2013   toldf that she doesn 't have apnea but she stays sleeping a lot  . Upper extremity weakness     Past Surgical History:  Procedure Laterality Date  . 2 ovarian cysts     age 58  . CERVICAL DISC ARTHROPLASTY N/A 10/23/2015   Procedure: Cervical Disc Arthroplasty Cervical six-seven;  Surgeon: Eustace Moore, MD;  Location: [facility];  Service: Neurosurgery;  Laterality: N/A;  . ESOPHAGOGASTRODUODENOSCOPY     for GERD  .  LAPAROSCOPIC ABDOMINAL EXPLORATION     dx ibs  . TUBAL LIGATION      There were no vitals filed for this visit.      Subjective Assessment - 10/14/16 1147    Subjective Came in with her new w/c, propelling herself. At home working on standing and transfers. Wants to be able to transfer onto her master toilet (w/c fits into bathroom, toilet on her right, just past a double sink cabinet).    Patient is accompained by: Family member   Limitations Sitting;Lifting;Standing;Walking;House hold activities   How long can you sit comfortably? 2 hrs prior to onset of pain    Patient Stated Goals sitting upright, walking, driving, cooking, cleaning, return to work, walk the dog    Currently in Pain? Yes   Pain Score 4    Pain Location Leg   Pain Orientation Right;Left   Pain Descriptors / Indicators Aching   Pain Type Acute pain   Pain Onset More than a month ago   Pain Frequency Constant                         [facility] Adult PT Treatment/Exercise - 10/14/16 1618      Bed Mobility   Bed Mobility Rolling Right;Rolling Left;Right Sidelying to  Sit;Sit to Supine;Sitting - Scoot to [employer] of Bed   Rolling Right 6: Modified independent (Device/Increase time)   Rolling Left 4: Min assist   Rolling Left Details (indicate cue type and reason) assist to rt pelvis to complete task, pt using head and bil UEs for momentum   Right Sidelying to Sit 4: Min guard   Sitting - Scoot to [employer] of Bed 4: Min guard   Sit to Supine 4: Min assist   Sit to Supine - Details (indicate cue type and reason) mat table raised to simulate home bed height (which is tall for pt); pt able to hike lt hip up onto bed, but very near edge as she begins to lie down and assist to bring legs onto bed     Transfers   Transfers Squat Pivot Transfers   Squat Pivot Transfers 4: Min guard;4: Min assist;With upper extremity assistance;With armrests   Squat Pivot Transfer Details (indicate cue type and reason) to/from 3 n 1,  wheelchair, and elevated bed; pt able to complete transfer to 3 n1 x4 with last 2 transfers with no physical assist or vc (pt pushes off surface and reaches aross to far armrest as she pivots); w/c to bed x 2 with min assist due to height of bed and pt with difficulty getting hips securely onto bed and then difficulty getting feet safely to floor without pelvis leaving bed prematurely     Ambulation/Gait   Ambulation/Gait Assistance 3: Mod assist   Ambulation/Gait Assistance Details husband demonstrated how he assists patient at home (he stands behind her with one hand under each  arm (he is very tall and basically has his elbows locked at his sides at 90 degrees   Ambulation Distance (Feet) 50 Feet   Assistive device Other (Comment)  husband see details   Gait Pattern Ataxic   Ambulation Surface Level;Indoor     Static Sitting Balance   Static Sitting - Balance Support Bilateral upper extremity supported;Feet unsupported   Static Sitting - Level of Assistance 5: Stand by assistance   Static Sitting - Comment/# of Minutes at elevated EOB (simulated height of her bed); educated on maintaining hip flexion with shoulders above pelvis or slightly forward to "anchor" her pelvis to the surface and to avoid hip extension as this could cause her to slide off EOB                PT Education - 10/14/16 1635    Education provided Yes   Education Details use of 3n1 over toilet to give her hand-holds for incr independence with transfers; problem-solved transfer in 1/2 bath (w/c does not fit through door); pt inquiring re: tub bench for incr independence with shower, however has "garden tub" with shower and when shown height of tub bench, she agreed her tub was too tall for it to work   [employer]) Educated Patient   Methods Explanation;Demonstration   Comprehension Verbalized understanding;Returned demonstration          PT Short Term Goals - 09/19/16 1610      PT SHORT TERM GOAL #1   Title  Patient will verbalize understanding and return demonstration for initial HEP to improve LE strength and balance. (TARGET DATE: 10/21/2016)    Time 4   Period Weeks   Status New     PT SHORT TERM GOAL #2   Title Patient will be able to safely maintain upright posture sitting edge of bed with min A for 1 minute to indicate  improvement in trunk control. (TARGET DATE: 10/21/2016)    Time 4   Period Weeks   Status New     PT SHORT TERM GOAL #3   Title Patient will demonstrate ability to transfer from w/c to mat with min A to indicate improvement in functional mobility and a decrease in her risk of falling. (TARGET DATE: 10/21/2016)    Time 4   Period Weeks   Status New     PT SHORT TERM GOAL #4   Title Patient will demonstrate ability to maintain static standing for 30s with BUE support on RW and min assist to indicate a decrease in risk of falling. (TARGET DATE: 10/21/2016)    Time 4   Period Weeks   Status New           PT Long Term Goals - 09/19/16 1601      PT LONG TERM GOAL #1   Title Patient will verbalize understanding and return demonstration for ongoing HEP. (TARGET DATE: 11/25/2016)    Time 10   Period Weeks   Status New     PT LONG TERM GOAL #2   Title Patient will demonstrate ability to safely maintain trunk control sitting balance static and reaches 6" modified indepedent to indicate a decrease in her risk of falling. (TARGET DATE: 11/25/2016)    Time 10   Period Weeks   Status New     PT LONG TERM GOAL #3   Title Patient will be able to transfer w/c to mat modified independent to indicate a decrease in her risk of falling during transfers. (TARGET DATE: 11/25/2016)    Time 10   Period Weeks   Status New     PT LONG TERM GOAL #4   Title Patient will demonstrate ability to climb stairs (1 rail) and min A to indicate a decreae in caregiver burden and a decrease in her risk of falling when climbing the stairs to get to the bedroom and bathroom at home. (TARGET DATE:  11/25/2016)    Time 10   Period Weeks   Status New     PT LONG TERM GOAL #5   Title Patient will demonstrate ability to ambulate 43ft with LRAD and min A to indicate improvement in ability to ambulate household distances with assistance. (TARGET DATE: 11/25/2016)    Time 10   Period Weeks   Status New     Additional Long Term Goals   Additional Long Term Goals Yes     PT LONG TERM GOAL #6   Title Patient will improvem FOTO score by >/= 10% to indicate improvement in self-perceived functional mobility. (TARGET DATE: 11/25/2016)    Time 10   Period Weeks   Status New               Plan - 10/14/16 1640    Clinical Impression Statement Skilled session focused on transfers to/from wheelchair, 3n1, and bed. Patient demonstrating better control of legs and trunk with "staying lower" during squat-pivot transfer and able to complete to 3n1 with closeguarding only. Patient continues to progress toward goals and will continue to benefit from PT.    Rehab Potential Good   Clinical Impairments Affecting Rehab Potential HTN, cervical spondylosis, C6-7 L side disc protrusion causing L foraminal impingement, anemia, functional neurological disorder, anemia     PT Frequency 2x / week   PT Duration Other (comment)  10 weeks    PT Treatment/Interventions ADLs/Self Care Home Management;Electrical Stimulation;Neuromuscular re-education;Balance training;Therapeutic exercise;Therapeutic activities;Functional mobility  training;Stair training;Gait training;DME Instruction;Patient/family education;Energy conservation   PT Next Visit Plan ? followup with MD re: OT referral; progress gait training with w/c follow; use air cast for R ankle support; progress balance activities with emphasis on trunk control to maintain upright posture; utilize graded movement to foster proper motor control.  May have to use weights on extremeties and all 4 extremeties in closed chain for increased proprioception and control ?try  quadruped   Recommended Other Services still no OT order from Dr. Allie Bossier and Agree with Plan of Care Patient;Family member/caregiver   Family Member Consulted husband      Patient will benefit from skilled therapeutic intervention in order to improve the following deficits and impairments:  Decreased activity tolerance, Decreased balance, Decreased mobility, Decreased knowledge of use of DME, Decreased endurance, Decreased strength, Impaired flexibility, Impaired tone  Visit Diagnosis: Other abnormalities of gait and mobility  Other symptoms and signs involving the nervous system  Muscle weakness (generalized)     Problem List Patient Active Problem List   Diagnosis Date Noted  . Abnormal laboratory test 09/09/2016  . Gait abnormality 08/03/2016  . Weakness 08/03/2016  . Paresthesia 08/03/2016  . S/P cervical spinal fusion 10/23/2015  . Hypersomnia 12/08/2010  . 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  . EPIGASTRIC PAIN 10/13/2009  . IRRITABLE BOWEL SYNDROME 03/10/2009  . BACK PAIN, LUMBAR 07/01/2008  . COSTOCHONDRITIS 02/01/2008  . VIRAL URI 01/08/2008  . CHEST PAIN 01/08/2008  . 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 ARM PAIN 12/25/2007  . ANEMIA-NOS 10/02/2007  . Essential hypertension 10/02/2007  . GERD 10/02/2007  . POLYP, GALLBLADDER 10/02/2007  . UTERINE POLYP 10/02/2007  . OSTEOARTHRITIS 10/02/2007  . Dyskinesia 10/02/2007  . HEADACHE 10/02/2007  . PALPITATIONS 10/02/2007  . CARDIAC MURMUR, HX OF 10/02/2007  . NEPHROLITHIASIS, HX OF 10/02/2007    Rexanne Mano, PT 10/14/2016, 4:49 PM  [facility] [address] [place], [place], [address] Phone: [phone]   Fax:  [phone]  Name: Amy Barry MRN: [id number] Date of Birth: 02-17-1971

## 2016-10-18 ENCOUNTER — Ambulatory Visit: Payer: 59 | Admitting: Physical Therapy

## 2016-10-18 ENCOUNTER — Encounter: Payer: Self-pay | Admitting: Physical Therapy

## 2016-10-18 DIAGNOSIS — R2681 Unsteadiness on feet: Secondary | ICD-10-CM | POA: Diagnosis not present

## 2016-10-18 DIAGNOSIS — R29818 Other symptoms and signs involving the nervous system: Secondary | ICD-10-CM

## 2016-10-18 DIAGNOSIS — R2689 Other abnormalities of gait and mobility: Secondary | ICD-10-CM

## 2016-10-18 NOTE — Therapy (Signed)
[facility] [address] [place], [place], [address] Phone: [phone]   Fax:  [phone]  Physical Therapy Treatment  Patient Details  Name: Amy Barry MRN: [id number] Date of Birth: Aug 22, 1970 Referring Provider: Donald Prose MD   Encounter Date: 10/18/2016      PT End of Session - 10/18/16 2001    Visit Number 9   Number of Visits 20   Date for PT Re-Evaluation 11/25/16   Authorization Type [place] - Visit Number 9   Authorization - Number of Visits 20   PT Start Time 4235   PT Stop Time 1620   PT Time Calculation (min) 40 min   Equipment Utilized During Treatment Gait belt;Other (comment)  Rt air cast   Activity Tolerance Patient tolerated treatment well   Behavior During Therapy WFL for tasks assessed/performed      Past Medical History:  Diagnosis Date  . Anemia   . Dysrhythmia    pvc's holter monitor started lopressor- no return visit  . GERD (gastroesophageal reflux disease)   . Headache(784.0)   . Heart murmur    child no echo  . History of kidney stones   . Hypertension   . IBS (irritable bowel syndrome)   . Low vitamin D level   . Lower extremity weakness   . Neuromuscular disorder ([place])   . Numbness and tingling   . Osteoarthritis   . Pneumonia    January- tx with IV antibiotics  for pneumonia in 04/2015   . PVC (premature ventricular contraction)   . Seizure disorder ([place])    denied seizures- has dyskinesia  . Sleep concern 2013   toldf that she doesn 't have apnea but she stays sleeping a lot  . Upper extremity weakness     Past Surgical History:  Procedure Laterality Date  . 2 ovarian cysts     age 66  . CERVICAL DISC ARTHROPLASTY N/A 10/23/2015   Procedure: Cervical Disc Arthroplasty Cervical six-seven;  Surgeon: Eustace Moore, MD;  Location: [facility];  Service: Neurosurgery;  Laterality: N/A;  . ESOPHAGOGASTRODUODENOSCOPY     for GERD  .  LAPAROSCOPIC ABDOMINAL EXPLORATION     dx ibs  . TUBAL LIGATION      There were no vitals filed for this visit.      Subjective Assessment - 10/18/16 1941    Subjective Reports she has been able to go to the bathroom independently at home (after practicing,problem-solving safe technique last session). Reports she is able to get OOB to her w/c independently, however continues to require assist to get into her bed (tall bed). Reports she has been able to climb flight of stairs to bedroom with husband's supervision and descends steps with his assist. She has begun cooking (from w/c). 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 States onset of her symptoms was a few weeks after end of football season.    Patient is accompained by: Family member   Limitations Sitting;Lifting;Standing;Walking;House hold activities   How long can you sit comfortably? 2 hrs prior to onset of pain    Patient Stated Goals sitting upright, walking, driving, cooking, cleaning, return to work, walk the dog    Currently in Pain? --  none reported   Pain Onset More than a month ago  [facility] Adult PT Treatment/Exercise - 10/18/16 1948      Bed Mobility   Bed Mobility Rolling Right;Rolling Left;Right Sidelying to Sit;Sitting - Scoot to [employer] of Bed   Rolling Right 6: Modified independent (Device/Increase time)   Rolling Left 4: Min assist   Rolling Left Details (indicate cue type and reason) assist to fully roll pelvis    Right Sidelying to Sit 4: Min guard   Sitting - Scoot to [employer] of Bed 4: Min guard     Transfers   Transfers Sit to Stand;Stand to Sit   Sit to Stand 4: Min guard;4: Min assist;With upper extremity assist   Sit to Stand Details (indicate cue type and reason) closeguarding for safety, especially when transitioning hands from surface  to surface   Stand to Sit 4: Min guard;With upper extremity assist;Without upper extremity assist   Stand to Sit Details at times very fatigued and "can't reach back" to control descent;    Comments one transfer sit to stand from w/c to place hands on mat table and progress to bring knees onto table into quadruped (with min assist);      Ambulation/Gait   Ambulation/Gait Assistance 4: Min assist;3: Mod assist   Ambulation/Gait Assistance Details minguard to min assist most of the time, however when trunk thrusts backwards, she requires assist to prevent fall; focused on trying to take small, quiet steps with pt able to control step height and no longer slamming feet to the floor (stomping); continues with ataxic, undulating movements of trunk and RLE > LLE   Ambulation Distance (Feet) 15 Feet  25   Assistive device Rolling [place]  weighted by 5# ankle weight on each side   Gait Pattern Ataxic   Ambulation Surface Level     Neuromuscular Re-education  Patient assisted into quadruped with min-mod assist (+1 standby for safety). Patient with difficulty maintaining UE extension with frequent collapsing at elbows (Of note, when walking with [place] she maintained elbow extension to support her body weight).   Assisted to tall-kneeling with short bench in front of pt for UE support and min assist at pelvis/hips. Patient initially with very ataxic-looking torso, however with cues to keep her shoulders over her hips and lighter UE weigth-bearing, she was able to maintain tall kneeling with light min assist to Rt posterior pelvis. Patient cued to look out window across the room and to name animals going from A to Z. She maintained tall kneeling for ~5 minutes total with excellent head and trunk control. Lowered to "child's pose" to rest x 3 minutes and then up to tall-kneeling with UEs on bench. Pt able to side-step 2 feet to her left and then right with occasional min assist at pelvis and 2nd person  anchoring bench to prevent tipping.   Returned to sidelying to rest (see Bed mobility)           PT Education - 10/18/16 2000    Education provided Yes   Education Details proper use of RW (she had tried at home and continued to lose balance posteriorly and "[place] didn't help at all")   Person(s) Educated Patient   Methods Explanation   Comprehension Verbalized understanding;Returned demonstration;Tactile cues required;Need further instruction          PT Short Term Goals - 09/19/16 1610      PT SHORT TERM GOAL #1   Title Patient will verbalize understanding and return demonstration for initial HEP to improve LE strength and balance. (TARGET DATE: 10/21/2016)  Time 4   Period Weeks   Status New     PT SHORT TERM GOAL #2   Title Patient will be able to safely maintain upright posture sitting edge of bed with min A for 1 minute to indicate improvement in trunk control. (TARGET DATE: 10/21/2016)    Time 4   Period Weeks   Status New     PT SHORT TERM GOAL #3   Title Patient will demonstrate ability to transfer from w/c to mat with min A to indicate improvement in functional mobility and a decrease in her risk of falling. (TARGET DATE: 10/21/2016)    Time 4   Period Weeks   Status New     PT SHORT TERM GOAL #4   Title Patient will demonstrate ability to maintain static standing for 30s with BUE support on RW and min assist to indicate a decrease in risk of falling. (TARGET DATE: 10/21/2016)    Time 4   Period Weeks   Status New           PT Long Term Goals - 09/19/16 1601      PT LONG TERM GOAL #1   Title Patient will verbalize understanding and return demonstration for ongoing HEP. (TARGET DATE: 11/25/2016)    Time 10   Period Weeks   Status New     PT LONG TERM GOAL #2   Title Patient will demonstrate ability to safely maintain trunk control sitting balance static and reaches 6" modified indepedent to indicate a decrease in her risk of falling. (TARGET DATE:  11/25/2016)    Time 10   Period Weeks   Status New     PT LONG TERM GOAL #3   Title Patient will be able to transfer w/c to mat modified independent to indicate a decrease in her risk of falling during transfers. (TARGET DATE: 11/25/2016)    Time 10   Period Weeks   Status New     PT LONG TERM GOAL #4   Title Patient will demonstrate ability to climb stairs (1 rail) and min A to indicate a decreae in caregiver burden and a decrease in her risk of falling when climbing the stairs to get to the bedroom and bathroom at home. (TARGET DATE: 11/25/2016)    Time 10   Period Weeks   Status New     PT LONG TERM GOAL #5   Title Patient will demonstrate ability to ambulate 48ft with LRAD and min A to indicate improvement in ability to ambulate household distances with assistance. (TARGET DATE: 11/25/2016)    Time 10   Period Weeks   Status New     Additional Long Term Goals   Additional Long Term Goals Yes     PT LONG TERM GOAL #6   Title Patient will improvem FOTO score by >/= 10% to indicate improvement in self-perceived functional mobility. (TARGET DATE: 11/25/2016)    Time 10   Period Weeks   Status New               Plan - 10/18/16 2002    Clinical Impression Statement Skilled session focused on activities to promote trunk/core stability with attempted carryover to gait training (although torso remained "ataxic" with gait, it was improved). Focused on using less effort to ambulate, trying to reduce UE weight-bearing/support and to make smaller amplitude of movements (for improved control). She continues to make good progress and will continue to benefit from PT. Noted patient will be up to visit  10 on her next visit and has a 10 visit limit per her insurance. Will plan to discuss how to schedule for these remaining visits (?continue BID vs decrease to QD).    Rehab Potential Good   Clinical Impairments Affecting Rehab Potential HTN, cervical spondylosis, C6-7 L side disc protrusion  causing L foraminal impingement, anemia, functional neurological disorder, anemia     PT Frequency 2x / week   PT Duration Other (comment)  10 weeks    PT Treatment/Interventions ADLs/Self Care Home Management;Electrical Stimulation;Neuromuscular re-education;Balance training;Therapeutic exercise;Therapeutic activities;Functional mobility training;Stair training;Gait training;DME Instruction;Patient/family education;Energy conservation   PT Next Visit Plan discuss plan for remaining 10 visits (?continue bid, qd, save any visits???); progress gait training with w/c follow and RW weighted down (encouraging smaller, quieter steps seemed successful); use air cast for R ankle support; progress balance activities with emphasis on trunk control to maintain upright posture (? repeat tall kneeling); utilize graded movement to foster proper motor control.  work extremities in closed chain for increased proprioception and control?try quadruped   Consulted and Agree with Plan of Care Patient;Family member/caregiver   Family Member Consulted daughter      Patient will benefit from skilled therapeutic intervention in order to improve the following deficits and impairments:  Decreased activity tolerance, Decreased balance, Decreased mobility, Decreased knowledge of use of DME, Decreased endurance, Decreased strength, Impaired flexibility, Impaired tone  Visit Diagnosis: Other abnormalities of gait and mobility  Other symptoms and signs involving the nervous system  Unsteadiness on feet     Problem List Patient Active Problem List   Diagnosis Date Noted  . Abnormal laboratory test 09/09/2016  . Gait abnormality 08/03/2016  . Weakness 08/03/2016  . Paresthesia 08/03/2016  . S/P cervical spinal fusion 10/23/2015  . Hypersomnia 12/08/2010  . 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  . EPIGASTRIC PAIN 10/13/2009  . IRRITABLE BOWEL SYNDROME 03/10/2009  . BACK PAIN, LUMBAR 07/01/2008  . COSTOCHONDRITIS  02/01/2008  . VIRAL URI 01/08/2008  . CHEST PAIN 01/08/2008  . 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 ARM PAIN 12/25/2007  . ANEMIA-NOS 10/02/2007  . Essential hypertension 10/02/2007  . GERD 10/02/2007  . POLYP, GALLBLADDER 10/02/2007  . UTERINE POLYP 10/02/2007  . OSTEOARTHRITIS 10/02/2007  . Dyskinesia 10/02/2007  . HEADACHE 10/02/2007  . PALPITATIONS 10/02/2007  . CARDIAC MURMUR, HX OF 10/02/2007  . NEPHROLITHIASIS, HX OF 10/02/2007    Rexanne Mano, PT 10/18/2016, 8:18 PM  [facility] [address] [place], [place], [address] Phone: [phone]   Fax:  [phone]  Name: IVELIZ GARAY MRN: [id number] Date of Birth: [birth year]

## 2016-10-21 ENCOUNTER — Encounter: Payer: Self-pay | Admitting: Physical Therapy

## 2016-10-21 ENCOUNTER — Ambulatory Visit: Payer: 59 | Admitting: Physical Therapy

## 2016-10-21 DIAGNOSIS — R2681 Unsteadiness on feet: Secondary | ICD-10-CM

## 2016-10-21 DIAGNOSIS — R29818 Other symptoms and signs involving the nervous system: Secondary | ICD-10-CM

## 2016-10-21 NOTE — Patient Instructions (Addendum)
Hamstring Stretch (Sitting)    Sitting, extend one leg and place hands on same thigh for support. Keeping torso straight, lean forward, sliding hands down leg, until a stretch is felt in back of thigh. Hold ___30_ seconds. Repeat with other leg. Repeat 2 times each session. Do 2-3 sessions per day.  Copyright  V[employer]. All rights reserved.   Bracing With Bridging (Hook-Lying)    Put green band around your thighs, tighten pelvic floor and abdominals and hold. Lift bottom. Count to 3 while breathing! Repeat __10_ times.   Copyright  VHI. All rights reserved.

## 2016-10-22 NOTE — Therapy (Signed)
[facility] [address] [place], [place], [address] Phone: [phone]   Fax:  [phone]  Physical Therapy Treatment  Patient Details  Name: Amy Barry MRN: [id number] Date of Birth: 08/21/1970 Referring Provider: Donald Prose MD   Encounter Date: 10/21/2016      PT End of Session - 10/22/16 1245    Visit Number 10   Number of Visits 20   Date for PT Re-Evaluation 11/25/16   Authorization Type [place] - Visit Number 10   Authorization - Number of Visits 20   PT Start Time 3846   PT Stop Time 1235   PT Time Calculation (min) 48 min   Equipment Utilized During Treatment Other (comment)  Rt air cast   Activity Tolerance Patient tolerated treatment well   Behavior During Therapy WFL for tasks assessed/performed      Past Medical History:  Diagnosis Date  . Anemia   . Dysrhythmia    pvc's holter monitor started lopressor- no return visit  . GERD (gastroesophageal reflux disease)   . Headache(784.0)   . Heart murmur    child no echo  . History of kidney stones   . Hypertension   . IBS (irritable bowel syndrome)   . Low vitamin D level   . Lower extremity weakness   . Neuromuscular disorder ([employer])   . Numbness and tingling   . Osteoarthritis   . Pneumonia    January- tx with IV antibiotics  for pneumonia in 04/2015   . PVC (premature ventricular contraction)   . Seizure disorder ([place])    denied seizures- has dyskinesia  . Sleep concern 2013   toldf that she doesn 't have apnea but she stays sleeping a lot  . Upper extremity weakness     Past Surgical History:  Procedure Laterality Date  . 2 ovarian cysts     age 22  . CERVICAL DISC ARTHROPLASTY N/A 10/23/2015   Procedure: Cervical Disc Arthroplasty Cervical six-seven;  Surgeon: Eustace Moore, MD;  Location: [facility];  Service: Neurosurgery;  Laterality: N/A;  . ESOPHAGOGASTRODUODENOSCOPY     for GERD  . LAPAROSCOPIC  ABDOMINAL EXPLORATION     dx ibs  . TUBAL LIGATION      There were no vitals filed for this visit.      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 Has told herself she would get fondant out and start trying and she hasn't yet.    Patient is accompained by: (P)  Family member   Limitations (P)  Sitting;Lifting;Standing;Walking;House hold activities   How long can you sit comfortably? (P)  2 hrs prior to onset of pain    Patient Stated Goals (P)  sitting upright, walking, driving, cooking, cleaning, return to work, walk the dog    Currently in Pain? (P)  Yes   Pain Score (P)  6    Pain Location (P)  Finger (Comment which one)  fingertips   Pain Orientation (P)  Right;Left   Pain Descriptors / Indicators (P)  Numbness   Pain Type (P)  Acute pain   Pain Onset (P)  More than a month ago                         [facility] Adult PT Treatment/Exercise - 10/22/16 0001      Bed Mobility  Bed Mobility Rolling Right;Rolling Left;Right Sidelying to Sit;Sitting - Scoot to Edge of Bed   Rolling Right 6: Modified independent (Device/Increase time)   Rolling Right Details (indicate cue type and reason) incr time and effort with use of edge of mat to pull   Rolling Left 5: Supervision   Right Sidelying to Sit 4: Min guard   Sitting - Scoot to [employer] of Bed 4: Catering manager Transfers 4: Min guard;With upper extremity assistance   Squat Pivot Transfer Details (indicate cue type and reason) to/from w/c to elevated mat table to simulate home bed height     Knee/Hip Exercises: Stretches   Active Hamstring Stretch Both;2 reps;30 seconds   Active Hamstring Stretch Limitations seated with foot on stool   Passive Hamstring Stretch Both;1 rep;30 seconds   Passive Hamstring Stretch Limitations supine   Hip Flexor Stretch Both;1 rep;30 seconds   Hip Flexor Stretch  Limitations knees to chest, extend one leg     Knee/Hip Exercises: Aerobic   Other Aerobic seated in w/c sci-fit L1.0, 5 min, legs only     Knee/Hip Exercises: Supine   Bridges AAROM;Strengthening;Both;4 sets  5reps each; last two with green band to help stabilize LEs                 PT Education - 10/22/16 1243    Education provided Yes   Education Details additions to HEP; encouraged her to attempt cake-making with lower expectations but has to start somewhere (i.e. just kneading fondant) and can be helpful with finger/nerve pain   Person(s) Educated Patient   Methods Explanation;Demonstration;Handout;Verbal cues   Comprehension Verbalized understanding;Returned demonstration;Need further instruction          PT Short Term Goals - 10/22/16 1248      PT SHORT TERM GOAL #1   Title Patient will verbalize understanding and return demonstration for initial HEP to improve LE strength and balance. (TARGET DATE: 10/21/2016)    Time 4   Period Weeks   Status Achieved     PT SHORT TERM GOAL #2   Title Patient will be able to safely maintain upright posture sitting edge of bed with min A for 1 minute to indicate improvement in trunk control. (TARGET DATE: 10/21/2016)    Time 4   Period Weeks   Status Achieved     PT SHORT TERM GOAL #3   Title Patient will demonstrate ability to transfer from w/c to mat with min A to indicate improvement in functional mobility and a decrease in her risk of falling. (TARGET DATE: 10/21/2016)    Time 4   Period Weeks   Status Achieved     PT SHORT TERM GOAL #4   Title Patient will demonstrate ability to maintain static standing for 30s with BUE support on RW and min assist to indicate a decrease in risk of falling. (TARGET DATE: 10/21/2016)    Time 4   Period Weeks   Status Achieved           PT Long Term Goals - 09/19/16 1601      PT LONG TERM GOAL #1   Title Patient will verbalize understanding and return demonstration for ongoing  HEP. (TARGET DATE: 11/25/2016)    Time 10   Period Weeks   Status New     PT LONG TERM GOAL #2   Title Patient will demonstrate ability to safely maintain trunk control sitting  balance static and reaches 6" modified indepedent to indicate a decrease in her risk of falling. (TARGET DATE: 11/25/2016)    Time 10   Period Weeks   Status New     PT LONG TERM GOAL #3   Title Patient will be able to transfer w/c to mat modified independent to indicate a decrease in her risk of falling during transfers. (TARGET DATE: 11/25/2016)    Time 10   Period Weeks   Status New     PT LONG TERM GOAL #4   Title Patient will demonstrate ability to climb stairs (1 rail) and min A to indicate a decreae in caregiver burden and a decrease in her risk of falling when climbing the stairs to get to the bedroom and bathroom at home. (TARGET DATE: 11/25/2016)    Time 10   Period Weeks   Status New     PT LONG TERM GOAL #5   Title Patient will demonstrate ability to ambulate 56ft with LRAD and min A to indicate improvement in ability to ambulate household distances with assistance. (TARGET DATE: 11/25/2016)    Time 10   Period Weeks   Status New     Additional Long Term Goals   Additional Long Term Goals Yes     PT LONG TERM GOAL #6   Title Patient will improvem FOTO score by >/= 10% to indicate improvement in self-perceived functional mobility. (TARGET DATE: 11/25/2016)    Time 10   Period Weeks   Status New               Plan - 10/22/16 1249    Clinical Impression Statement Initial portion of session spent discussing pt's goals, including her desire to improve UE/hand use to begin decorating cakes. STGs assessed with pt meeting 4 of 4 goals. Continue to focus on increasing coordination, postural control, and flexibility. Will need to have discussion re: frequency of remaining 10 visits (20 visit limit per insurance) and ?OT consult.    Rehab Potential Good   Clinical Impairments Affecting Rehab  Potential HTN, cervical spondylosis, C6-7 L side disc protrusion causing L foraminal impingement, anemia, functional neurological disorder, anemia     PT Frequency 2x / week   PT Duration Other (comment)  10 weeks    PT Treatment/Interventions ADLs/Self Care Home Management;Electrical Stimulation;Neuromuscular re-education;Balance training;Therapeutic exercise;Therapeutic activities;Functional mobility training;Stair training;Gait training;DME Instruction;Patient/family education;Energy conservation   PT Next Visit Plan discuss plan for remaining 10 visits (?continue bid, qd, save any visits???); progress gait training with w/c follow and RW weighted down (encouraging smaller, quieter steps seemed successful); use air cast for R ankle support; progress balance activities with emphasis on trunk control to maintain upright posture (? repeat tall kneeling); utilize graded movement to foster proper motor control.  work extremities in closed chain for increased proprioception and control?try quadruped   Recommended Other Services OT evaluation   Consulted and Agree with Plan of Care Patient;Family member/caregiver   Family Member Consulted husband      Patient will benefit from skilled therapeutic intervention in order to improve the following deficits and impairments:  Decreased activity tolerance, Decreased balance, Decreased mobility, Decreased knowledge of use of DME, Decreased endurance, Decreased strength, Impaired flexibility, Impaired tone  Visit Diagnosis: Other symptoms and signs involving the nervous system  Unsteadiness on feet     Problem List Patient Active Problem List   Diagnosis Date Noted  . Abnormal laboratory test 09/09/2016  . Gait abnormality 08/03/2016  . Weakness 08/03/2016  .  Paresthesia 08/03/2016  . S/P cervical spinal fusion 10/23/2015  . Hypersomnia 12/08/2010  . 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  . EPIGASTRIC PAIN 10/13/2009  . IRRITABLE BOWEL SYNDROME  03/10/2009  . BACK PAIN, LUMBAR 07/01/2008  . COSTOCHONDRITIS 02/01/2008  . VIRAL URI 01/08/2008  . CHEST PAIN 01/08/2008  . 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 ARM PAIN 12/25/2007  . ANEMIA-NOS 10/02/2007  . Essential hypertension 10/02/2007  . GERD 10/02/2007  . POLYP, GALLBLADDER 10/02/2007  . UTERINE POLYP 10/02/2007  . OSTEOARTHRITIS 10/02/2007  . Dyskinesia 10/02/2007  . HEADACHE 10/02/2007  . PALPITATIONS 10/02/2007  . CARDIAC MURMUR, HX OF 10/02/2007  . NEPHROLITHIASIS, HX OF 10/02/2007    Rexanne Mano, PT 10/22/2016, 12:54 PM  [facility] [address] [place], [place], [address] Phone: [phone]   Fax:  [phone]  Name: Amy Barry MRN: [id number] Date of Birth: January 16, 1971

## 2016-10-24 ENCOUNTER — Encounter: Payer: Self-pay | Admitting: Physical Therapy

## 2016-10-24 ENCOUNTER — Ambulatory Visit: Payer: 59 | Admitting: Physical Therapy

## 2016-10-24 DIAGNOSIS — R2689 Other abnormalities of gait and mobility: Secondary | ICD-10-CM

## 2016-10-24 DIAGNOSIS — R2681 Unsteadiness on feet: Secondary | ICD-10-CM

## 2016-10-24 DIAGNOSIS — M6281 Muscle weakness (generalized): Secondary | ICD-10-CM

## 2016-10-24 DIAGNOSIS — Z9181 History of falling: Secondary | ICD-10-CM

## 2016-10-24 DIAGNOSIS — R29818 Other symptoms and signs involving the nervous system: Secondary | ICD-10-CM

## 2016-10-24 NOTE — Therapy (Signed)
[facility] [address] [place], [place], [address] Phone: [phone]   Fax:  [phone]  Physical Therapy Treatment  Patient Details  Name: Amy Barry MRN: [id number] Date of Birth: Aug 10, 1970 Referring Provider: Donald Prose MD   Encounter Date: 10/24/2016      PT End of Session - 10/24/16 1718    Visit Number 11   Number of Visits 20   Date for PT Re-Evaluation 11/25/16   Authorization Type [place] - Visit Number 11   Authorization - Number of Visits 20   PT Start Time 3295   PT Stop Time 1620   PT Time Calculation (min) 43 min   Equipment Utilized During Treatment Other (comment)  Rt air cast   Activity Tolerance Patient tolerated treatment well   Behavior During Therapy WFL for tasks assessed/performed      Past Medical History:  Diagnosis Date  . Anemia   . Dysrhythmia    pvc's holter monitor started lopressor- no return visit  . GERD (gastroesophageal reflux disease)   . Headache(784.0)   . Heart murmur    child no echo  . History of kidney stones   . Hypertension   . IBS (irritable bowel syndrome)   . Low vitamin D level   . Lower extremity weakness   . Neuromuscular disorder ([place])   . Numbness and tingling   . Osteoarthritis   . Pneumonia    January- tx with IV antibiotics  for pneumonia in 04/2015   . PVC (premature ventricular contraction)   . Seizure disorder ([place])    denied seizures- has dyskinesia  . Sleep concern 2013   toldf that she doesn 't have apnea but she stays sleeping a lot  . Upper extremity weakness     Past Surgical History:  Procedure Laterality Date  . 2 ovarian cysts     age 6  . CERVICAL DISC ARTHROPLASTY N/A 10/23/2015   Procedure: Cervical Disc Arthroplasty Cervical six-seven;  Surgeon: Eustace Moore, MD;  Location: [facility];  Service: Neurosurgery;  Laterality: N/A;  . ESOPHAGOGASTRODUODENOSCOPY     for GERD  . LAPAROSCOPIC  ABDOMINAL EXPLORATION     dx ibs  . TUBAL LIGATION      There were no vitals filed for this visit.      Subjective Assessment - 10/24/16 1542    Subjective Pt arrives in regular manual w/c with improved head control.  Worked on and made intricate cake topper out of fondant; reports it took her all day due to finger pain.  Has been work on walking up stairs and walking at home with family assistance.     Patient is accompained by: Family member   Limitations Sitting;Lifting;Standing;Walking;House hold activities   How long can you sit comfortably? 2 hrs prior to onset of pain    Patient Stated Goals sitting upright, walking, driving, cooking, cleaning, return to work, walk the dog    Currently in Pain? Yes   Pain Score 5    Pain Location Finger (Comment which one)   Pain Descriptors / Indicators Sore                         [facility] Adult PT Treatment/Exercise - 10/24/16 1711      Transfers   Transfers Squat Pivot Transfers   Sit to Stand 4: Min Art therapist Transfers 5: Supervision   Squat Pivot  Transfer Details (indicate cue type and reason) w/c > mat    Supine to Sit 3: Mod assist   Sit to Supine 3: Mod assist     Ambulation/Gait   Ambulation/Gait Assistance 3: Mod assist   Ambulation/Gait Assistance Details Mod A for proximal stability, cues for more upright posture and weight shifting for full WB through each LE prior to advancing contralateral LE; decreased episodes of LE buckling/giving out and decreased stomping noted today; pt was noted to be ER RLE during swing phase   Ambulation Distance (Feet) 100 Feet   Assistive device [place]  weighted with 6lb on each side   Gait Pattern Step-through pattern;Step-to pattern;Ataxic;Lateral hip instability;Trunk flexed   Ambulation Surface Level   Stairs Yes   Stairs Assistance 3: Mod assist   Stairs Assistance Details (indicate cue type and reason) Pt ascended and descended with LLE leading with  assistance to for weight shifting   Stair Management Technique Two rails;Step to pattern;Backwards;Forwards   Number of Stairs 4   Height of Stairs 6     Knee/Hip Exercises: Stretches   Passive Hamstring Stretch Right;Left;1 rep;30 seconds   Passive Hamstring Stretch Limitations supine   Piriformis Stretch Right;Left;1 rep;30 seconds   Gastroc Stretch Right;Left;1 rep;30 seconds   Other Knee/Hip Stretches supine passive hip ADD and medial HS stretch to R and LLE x 30 seconds each     Knee/Hip Exercises: Aerobic   Stepper SCI Fit seated stepper with LE only at resistance 2.0 x 4 minutes to focus on reciprocal LE movement, motor control, coordination and strengthening                PT Education - 10/24/16 1718    Education provided Yes   Education Details stretching for [employer]) Educated Patient;Child(ren)   Methods Explanation;Demonstration   Comprehension Verbalized understanding          PT Short Term Goals - 10/22/16 1248      PT SHORT TERM GOAL #1   Title Patient will verbalize understanding and return demonstration for initial HEP to improve LE strength and balance. (TARGET DATE: 10/21/2016)    Time 4   Period Weeks   Status Achieved     PT SHORT TERM GOAL #2   Title Patient will be able to safely maintain upright posture sitting edge of bed with min A for 1 minute to indicate improvement in trunk control. (TARGET DATE: 10/21/2016)    Time 4   Period Weeks   Status Achieved     PT SHORT TERM GOAL #3   Title Patient will demonstrate ability to transfer from w/c to mat with min A to indicate improvement in functional mobility and a decrease in her risk of falling. (TARGET DATE: 10/21/2016)    Time 4   Period Weeks   Status Achieved     PT SHORT TERM GOAL #4   Title Patient will demonstrate ability to maintain static standing for 30s with BUE support on RW and min assist to indicate a decrease in risk of falling. (TARGET DATE: 10/21/2016)    Time 4    Period Weeks   Status Achieved           PT Long Term Goals - 09/19/16 1601      PT LONG TERM GOAL #1   Title Patient will verbalize understanding and return demonstration for ongoing HEP. (TARGET DATE: 11/25/2016)    Time 10   Period Weeks   Status New  PT LONG TERM GOAL #2   Title Patient will demonstrate ability to safely maintain trunk control sitting balance static and reaches 6" modified indepedent to indicate a decrease in her risk of falling. (TARGET DATE: 11/25/2016)    Time 10   Period Weeks   Status New     PT LONG TERM GOAL #3   Title Patient will be able to transfer w/c to mat modified independent to indicate a decrease in her risk of falling during transfers. (TARGET DATE: 11/25/2016)    Time 10   Period Weeks   Status New     PT LONG TERM GOAL #4   Title Patient will demonstrate ability to climb stairs (1 rail) and min A to indicate a decreae in caregiver burden and a decrease in her risk of falling when climbing the stairs to get to the bedroom and bathroom at home. (TARGET DATE: 11/25/2016)    Time 10   Period Weeks   Status New     PT LONG TERM GOAL #5   Title Patient will demonstrate ability to ambulate 68ft with LRAD and min A to indicate improvement in ability to ambulate household distances with assistance. (TARGET DATE: 11/25/2016)    Time 10   Period Weeks   Status New     Additional Long Term Goals   Additional Long Term Goals Yes     PT LONG TERM GOAL #6   Title Patient will improvem FOTO score by >/= 10% to indicate improvement in self-perceived functional mobility. (TARGET DATE: 11/25/2016)    Time 10   Period Weeks   Status New               Plan - 10/24/16 1719    Clinical Impression Statement Pt demonstrating good motivation to improve: worked on [employer] with fondant this am and presented it to PT-noted to have significant small details in cake topper.  She also reports performing stairs at home with her two daughters  but continues to have to pull herself up the stairs.  Pt's goal is to be able to negotiate stairs with one rail.  Continued to focus on LE strengthening, reciprocal coordination and mm grading, stretches for HEP, gait with weighted RW and stair negotiation with pt demonstrating improved motor planning/control but continues to require mod A for proximal stability and control of RW.  Will continue to address and progress.  Will discuss frequency of visits at next session.   Rehab Potential Good   Clinical Impairments Affecting Rehab Potential HTN, cervical spondylosis, C6-7 L side disc protrusion causing L foraminal impingement, anemia, functional neurological disorder, anemia     PT Frequency 2x / week   PT Duration Other (comment)  10 weeks    PT Treatment/Interventions ADLs/Self Care Home Management;Electrical Stimulation;Neuromuscular re-education;Balance training;Therapeutic exercise;Therapeutic activities;Functional mobility training;Stair training;Gait training;DME Instruction;Patient/family education;Energy conservation   PT Next Visit Plan discuss plan for remaining 10 visits (?continue bid, qd, save any visits???); progress gait training with w/c follow and RW weighted down (encouraging smaller, quieter steps seemed successful); use air cast for R ankle support; progress balance activities with emphasis on trunk control to maintain upright posture (? repeat tall kneeling); utilize graded movement to foster proper motor control.  work extremities in closed chain for increased proprioception and control?try quadruped   Consulted and Agree with Plan of Care Patient;Family member/caregiver   Family Member Consulted husband      Patient will benefit from skilled therapeutic intervention in order to improve  the following deficits and impairments:  Decreased activity tolerance, Decreased balance, Decreased mobility, Decreased knowledge of use of DME, Decreased endurance, Decreased strength, Impaired  flexibility, Impaired tone  Visit Diagnosis: Other symptoms and signs involving the nervous system  Unsteadiness on feet  Other abnormalities of gait and mobility  Muscle weakness (generalized)  History of falling     Problem List Patient Active Problem List   Diagnosis Date Noted  . Abnormal laboratory test 09/09/2016  . Gait abnormality 08/03/2016  . Weakness 08/03/2016  . Paresthesia 08/03/2016  . S/P cervical spinal fusion 10/23/2015  . Hypersomnia 12/08/2010  . 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  . EPIGASTRIC PAIN 10/13/2009  . IRRITABLE BOWEL SYNDROME 03/10/2009  . BACK PAIN, LUMBAR 07/01/2008  . COSTOCHONDRITIS 02/01/2008  . VIRAL URI 01/08/2008  . CHEST PAIN 01/08/2008  . 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 ARM PAIN 12/25/2007  . ANEMIA-NOS 10/02/2007  . Essential hypertension 10/02/2007  . GERD 10/02/2007  . POLYP, GALLBLADDER 10/02/2007  . UTERINE POLYP 10/02/2007  . OSTEOARTHRITIS 10/02/2007  . Dyskinesia 10/02/2007  . HEADACHE 10/02/2007  . PALPITATIONS 10/02/2007  . CARDIAC MURMUR, HX OF 10/02/2007  . NEPHROLITHIASIS, HX OF 10/02/2007    Raylene Everts, PT, DPT 10/24/16    5:25 PM    [facility] [address] [place], [place], [address] Phone: [phone]   Fax:  [phone]  Name: Amy Barry MRN: [id number] Date of Birth: 14-Oct-1970

## 2016-10-28 ENCOUNTER — Ambulatory Visit: Payer: 59 | Admitting: Physical Therapy

## 2016-10-28 ENCOUNTER — Encounter: Payer: Self-pay | Admitting: Physical Therapy

## 2016-10-28 DIAGNOSIS — R2689 Other abnormalities of gait and mobility: Secondary | ICD-10-CM

## 2016-10-28 DIAGNOSIS — R2681 Unsteadiness on feet: Secondary | ICD-10-CM

## 2016-10-28 DIAGNOSIS — R29818 Other symptoms and signs involving the nervous system: Secondary | ICD-10-CM

## 2016-10-28 DIAGNOSIS — Z9181 History of falling: Secondary | ICD-10-CM

## 2016-10-28 DIAGNOSIS — M6281 Muscle weakness (generalized): Secondary | ICD-10-CM

## 2016-10-28 NOTE — Therapy (Signed)
[facility] [address] [place], [place], [address] Phone: [phone]   Fax:  [phone]  Physical Therapy Treatment  Patient Details  Name: Amy Barry MRN: [id number] Date of Birth: 10/16/1970 Referring Provider: Donald Prose MD   Encounter Date: 10/28/2016      PT End of Session - 10/28/16 1311    Visit Number 12   Number of Visits 20   Date for PT Re-Evaluation 11/25/16   Authorization Type [employer]    Authorization - Visit Number 12   Authorization - Number of Visits 20   PT Start Time 1150   PT Stop Time 0626   PT Time Calculation (min) 45 min   Equipment Utilized During Treatment Other (comment)  Rt air cast   Activity Tolerance Patient tolerated treatment well   Behavior During Therapy WFL for tasks assessed/performed      Past Medical History:  Diagnosis Date  . Anemia   . Dysrhythmia    pvc's holter monitor started lopressor- no return visit  . GERD (gastroesophageal reflux disease)   . Headache(784.0)   . Heart murmur    child no echo  . History of kidney stones   . Hypertension   . IBS (irritable bowel syndrome)   . Low vitamin D level   . Lower extremity weakness   . Neuromuscular disorder ([place])   . Numbness and tingling   . Osteoarthritis   . Pneumonia    January- tx with IV antibiotics  for pneumonia in 04/2015   . PVC (premature ventricular contraction)   . Seizure disorder ([place])    denied seizures- has dyskinesia  . Sleep concern 2013   toldf that she doesn 't have apnea but she stays sleeping a lot  . Upper extremity weakness     Past Surgical History:  Procedure Laterality Date  . 2 ovarian cysts     age 76  . CERVICAL DISC ARTHROPLASTY N/A 10/23/2015   Procedure: Cervical Disc Arthroplasty Cervical six-seven;  Surgeon: Eustace Moore, MD;  Location: [facility];  Service: Neurosurgery;  Laterality: N/A;  . ESOPHAGOGASTRODUODENOSCOPY     for GERD  . LAPAROSCOPIC  ABDOMINAL EXPLORATION     dx ibs  . TUBAL LIGATION      There were no vitals filed for this visit.      Subjective Assessment - 10/28/16 1253    Subjective Pt husband reports pt does not get to use the w/c at home; they leave it downstairs and she walks with assistance.  Pt reporting feeling extreme fatigue and malaise today but would like to work on stair negotiation.   Patient is accompained by: Family member   Limitations Sitting;Lifting;Standing;Walking;House hold activities   How long can you sit comfortably? 2 hrs prior to onset of pain    Patient Stated Goals sitting upright, walking, driving, cooking, cleaning, return to work, walk the dog    Currently in Pain? Yes  all over                         [facility] Adult PT Treatment/Exercise - 10/28/16 1255      Ambulation/Gait   Stairs Yes   Stairs Assistance 3: Mod assist   Stairs Assistance Details (indicate cue type and reason) Performed stair negotiation training with pt holding one rail R and then L with therapist supporting pt under elbow and pelvis with verbal, visual and tactile cues for upright posture and  forwards weight shifting to advance COG when ascending and descending due to pt tendency to flex trunk and maintain COG posterior to BOS   Stair Management Technique One rail Right;One rail Left;Step to pattern;Forwards   Number of Stairs 8   Height of Stairs 6     Self-Care   Self-Care Other Self-Care Comments   Other Self-Care Comments  discussed plan for final visits due to 20 visit limit per year and spacing visits out 1x/month and provided pt with information on pro bono clinic to allow extra therapy.  Pt and husband agreeable.  Will set new POC next visit.       Neuro Re-ed    Neuro Re-ed Details  Performed supine motor control exercises to focus on proximal stability and grading of movement during 10 reps single leg heel slides with hip ABD<>ADD focus on control of movement, alternating lower trunk  rotation with focus on activation of abdominals instead of pusing through LE and extension of trunk x 10 reps, and bilat LE bridges with core activation and coordinating bilat UE flexion during bridging x 10 reps with verbal cues for breathing sequence to avoid extra mm tension/activation.                PT Education - 10/28/16 1310    Education provided Yes   Education Details plan for visits, pro bono clinics    Person(s) Educated Patient;Child(ren);Spouse   Methods Explanation   Comprehension Verbalized understanding          PT Short Term Goals - 10/22/16 1248      PT SHORT TERM GOAL #1   Title Patient will verbalize understanding and return demonstration for initial HEP to improve LE strength and balance. (TARGET DATE: 10/21/2016)    Time 4   Period Weeks   Status Achieved     PT SHORT TERM GOAL #2   Title Patient will be able to safely maintain upright posture sitting edge of bed with min A for 1 minute to indicate improvement in trunk control. (TARGET DATE: 10/21/2016)    Time 4   Period Weeks   Status Achieved     PT SHORT TERM GOAL #3   Title Patient will demonstrate ability to transfer from w/c to mat with min A to indicate improvement in functional mobility and a decrease in her risk of falling. (TARGET DATE: 10/21/2016)    Time 4   Period Weeks   Status Achieved     PT SHORT TERM GOAL #4   Title Patient will demonstrate ability to maintain static standing for 30s with BUE support on RW and min assist to indicate a decrease in risk of falling. (TARGET DATE: 10/21/2016)    Time 4   Period Weeks   Status Achieved           PT Long Term Goals - 09/19/16 1601      PT LONG TERM GOAL #1   Title Patient will verbalize understanding and return demonstration for ongoing HEP. (TARGET DATE: 11/25/2016)    Time 10   Period Weeks   Status New     PT LONG TERM GOAL #2   Title Patient will demonstrate ability to safely maintain trunk control sitting balance  static and reaches 6" modified indepedent to indicate a decrease in her risk of falling. (TARGET DATE: 11/25/2016)    Time 10   Period Weeks   Status New     PT LONG TERM GOAL #3   Title Patient will  be able to transfer w/c to mat modified independent to indicate a decrease in her risk of falling during transfers. (TARGET DATE: 11/25/2016)    Time 10   Period Weeks   Status New     PT LONG TERM GOAL #4   Title Patient will demonstrate ability to climb stairs (1 rail) and min A to indicate a decreae in caregiver burden and a decrease in her risk of falling when climbing the stairs to get to the bedroom and bathroom at home. (TARGET DATE: 11/25/2016)    Time 10   Period Weeks   Status New     PT LONG TERM GOAL #5   Title Patient will demonstrate ability to ambulate 1ft with LRAD and min A to indicate improvement in ability to ambulate household distances with assistance. (TARGET DATE: 11/25/2016)    Time 10   Period Weeks   Status New     Additional Long Term Goals   Additional Long Term Goals Yes     PT LONG TERM GOAL #6   Title Patient will improvem FOTO score by >/= 10% to indicate improvement in self-perceived functional mobility. (TARGET DATE: 11/25/2016)    Time 10   Period Weeks   Status New               Plan - 10/28/16 1311    Clinical Impression Statement Treatment session focused on NMR for coordination, motor control, proximal stability and grading of movement as well as stair negotiation with one rail for home negotiation.  Also had long discussion with pt and husband regarding visit limit and best plan for final 8 visits.  Plan to space out visits for 1x/month and supplementing with therapy at pro bono clinic; will also continue to pursue OT.   Rehab Potential Good   Clinical Impairments Affecting Rehab Potential HTN, cervical spondylosis, C6-7 L side disc protrusion causing L foraminal impingement, anemia, functional neurological disorder, anemia     PT Frequency  2x / week   PT Duration Other (comment)   PT Treatment/Interventions ADLs/Self Care Home Management;Electrical Stimulation;Neuromuscular re-education;Balance training;Therapeutic exercise;Therapeutic activities;Functional mobility training;Stair training;Gait training;DME Instruction;Patient/family education;Energy conservation   PT Next Visit Plan change to one time/month for visits; progress stair negotiation and gait training with w/c follow and RW weighted down (encouraging smaller, quieter steps seemed successful); use air cast for R ankle support; progress balance activities with emphasis on trunk control to maintain upright posture (? repeat tall kneeling); utilize graded movement to foster proper motor control.  work extremities in closed chain for increased proprioception and control?try quadruped   Consulted and Agree with Plan of Care Patient;Family member/caregiver   Family Member Consulted husband      Patient will benefit from skilled therapeutic intervention in order to improve the following deficits and impairments:  Decreased activity tolerance, Decreased balance, Decreased mobility, Decreased knowledge of use of DME, Decreased endurance, Decreased strength, Impaired flexibility, Impaired tone  Visit Diagnosis: Other symptoms and signs involving the nervous system  Unsteadiness on feet  Other abnormalities of gait and mobility  Muscle weakness (generalized)  History of falling     Problem List Patient Active Problem List   Diagnosis Date Noted  . Abnormal laboratory test 09/09/2016  . Gait abnormality 08/03/2016  . Weakness 08/03/2016  . Paresthesia 08/03/2016  . S/P cervical spinal fusion 10/23/2015  . Hypersomnia 12/08/2010  . 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  . EPIGASTRIC PAIN 10/13/2009  . IRRITABLE BOWEL SYNDROME 03/10/2009  . BACK PAIN, LUMBAR 07/01/2008  .  COSTOCHONDRITIS 02/01/2008  . VIRAL URI 01/08/2008  . CHEST PAIN 01/08/2008  . OTHER SPECIFIED  EPISODIC MOOD DISORDER 12/25/2007  . ARM PAIN 12/25/2007  . ANEMIA-NOS 10/02/2007  . Essential hypertension 10/02/2007  . GERD 10/02/2007  . POLYP, GALLBLADDER 10/02/2007  . UTERINE POLYP 10/02/2007  . OSTEOARTHRITIS 10/02/2007  . Dyskinesia 10/02/2007  . HEADACHE 10/02/2007  . PALPITATIONS 10/02/2007  . CARDIAC MURMUR, HX OF 10/02/2007  . NEPHROLITHIASIS, HX OF 10/02/2007   Raylene Everts, PT, DPT 10/28/16    1:15 PM    [facility] [address] [place], [place], [address] Phone: [phone]   Fax:  [phone]  Name: ZAKYA HALABI MRN: [id number] Date of Birth: [birth year]

## 2016-11-02 ENCOUNTER — Ambulatory Visit: Payer: 59 | Attending: Family Medicine | Admitting: Physical Therapy

## 2016-11-02 DIAGNOSIS — R2689 Other abnormalities of gait and mobility: Secondary | ICD-10-CM | POA: Diagnosis present

## 2016-11-02 DIAGNOSIS — R2681 Unsteadiness on feet: Secondary | ICD-10-CM

## 2016-11-02 DIAGNOSIS — R260 Ataxic gait: Secondary | ICD-10-CM | POA: Diagnosis present

## 2016-11-02 DIAGNOSIS — M6281 Muscle weakness (generalized): Secondary | ICD-10-CM

## 2016-11-02 DIAGNOSIS — Z9181 History of falling: Secondary | ICD-10-CM | POA: Insufficient documentation

## 2016-11-02 DIAGNOSIS — R279 Unspecified lack of coordination: Secondary | ICD-10-CM | POA: Diagnosis present

## 2016-11-02 DIAGNOSIS — R29818 Other symptoms and signs involving the nervous system: Secondary | ICD-10-CM

## 2016-11-03 ENCOUNTER — Encounter: Payer: Self-pay | Admitting: Physical Therapy

## 2016-11-03 NOTE — Therapy (Signed)
[facility] [address] [place], [place], [address] Phone: [phone]   Fax:  [phone]  Physical Therapy Treatment  Patient Details  Name: Amy Barry MRN: [id number] Date of Birth: Nov 11, 1970 Referring Provider: Donald Prose MD   Encounter Date: 11/02/2016      PT End of Session - 11/02/16 1824    Visit Number 13   Number of Visits 20   Date for PT Re-Evaluation 11/25/16   Authorization Type [place] - Visit Number 13   Authorization - Number of Visits 20   PT Start Time 4536   PT Stop Time 1618   PT Time Calculation (min) 43 min   Equipment Utilized During Treatment Other (comment)  Rt air cast   Activity Tolerance Patient tolerated treatment well   Behavior During Therapy WFL for tasks assessed/performed      Past Medical History:  Diagnosis Date  . Anemia   . Dysrhythmia    pvc's holter monitor started lopressor- no return visit  . GERD (gastroesophageal reflux disease)   . Headache(784.0)   . Heart murmur    child no echo  . History of kidney stones   . Hypertension   . IBS (irritable bowel syndrome)   . Low vitamin D level   . Lower extremity weakness   . Neuromuscular disorder ([place])   . Numbness and tingling   . Osteoarthritis   . Pneumonia    January- tx with IV antibiotics  for pneumonia in 04/2015   . PVC (premature ventricular contraction)   . Seizure disorder ([place])    denied seizures- has dyskinesia  . Sleep concern 2013   toldf that she doesn 't have apnea but she stays sleeping a lot  . Upper extremity weakness     Past Surgical History:  Procedure Laterality Date  . 2 ovarian cysts     age 10  . CERVICAL DISC ARTHROPLASTY N/A 10/23/2015   Procedure: Cervical Disc Arthroplasty Cervical six-seven;  Surgeon: Eustace Moore, MD;  Location: [facility];  Service: Neurosurgery;  Laterality: N/A;  . ESOPHAGOGASTRODUODENOSCOPY     for GERD  . LAPAROSCOPIC  ABDOMINAL EXPLORATION     dx ibs  . TUBAL LIGATION      There were no vitals filed for this visit.      Subjective Assessment - 11/02/16 1535    Subjective Patient reports continuing to walk at home with family. She is hurting more that she is becoming more active. She reports malaise and severe heaviness. She has neurologist 2nd opinion is next week.    Patient is accompained by: Family member   Limitations Sitting;Lifting;Standing;Walking;House hold activities   How long can you sit comfortably? 2 hrs prior to onset of pain    Patient Stated Goals sitting upright, walking, driving, cooking, cleaning, return to work, walk the dog    Currently in Pain? Yes   Pain Score 6    Pain Location Other (Comment)  all over   Pain Orientation Right;Left   Pain Descriptors / Indicators Heaviness;Sore   Pain Type Chronic pain   Pain Onset More than a month ago   Pain Frequency Constant   Aggravating Factors  worse at night & first thing in mornings   Pain Relieving Factors medications; rest in side lying                         [facility] Adult PT  Treatment/Exercise - 11/02/16 1535      Transfers   Transfers Sit to Stand;Stand to Sit   Sit to Stand 4: Min assist;With upper extremity assist;With armrests;From chair/3-in-1  to RW, increased time to enable >pt participation    Stand to Sit 4: Min assist;With upper extremity assist;With armrests;To chair/3-in-1;Uncontrolled descent  from RW     Ambulation/Gait   Ambulation/Gait Yes   Ambulation/Gait Assistance 3: Mod assist   Ambulation Distance (Feet) 70 Feet  70', 30' & 15'   Assistive device Rolling [place]ciau;Other (Comment)  aircast on RLE   Gait Pattern Step-through pattern;Decreased stride length;Right hip hike;Left hip hike;Right steppage;Left steppage;Ataxic;Trunk flexed;Wide base of support   Ambulation Surface Indoor;Level     Self-Care   Self-Care Other Self-Care Comments   Other Self-Care Comments  Discussed  request to convert speech or OT visits to PT to enable >20 visits      Neuro Re-ed    Neuro Re-ed Details  Trunk control: Sitting on 24" stool in parallel bars- flexion with recovery, extension with recovery, right & left rotation.   24" stool to bars-sit to /from stand with UEs on thighs with minA.  Sitting at front edge of w/c without back or armrests support- trunk flexion w/ recovery, trunk extension w/ recovery, sidebend with contralateral UE assist w/ recovery and trunk rotation maintaining trunk over pelvis.      Knee/Hip Exercises: Aerobic   Nustep Level 3 with BUE & BLEs 3 min with verbal cues on fluency & full ROM                  PT Short Term Goals - 10/22/16 1248      PT SHORT TERM GOAL #1   Title Patient will verbalize understanding and return demonstration for initial HEP to improve LE strength and balance. (TARGET DATE: 10/21/2016)    Time 4   Period Weeks   Status Achieved     PT SHORT TERM GOAL #2   Title Patient will be able to safely maintain upright posture sitting edge of bed with min A for 1 minute to indicate improvement in trunk control. (TARGET DATE: 10/21/2016)    Time 4   Period Weeks   Status Achieved     PT SHORT TERM GOAL #3   Title Patient will demonstrate ability to transfer from w/c to mat with min A to indicate improvement in functional mobility and a decrease in her risk of falling. (TARGET DATE: 10/21/2016)    Time 4   Period Weeks   Status Achieved     PT SHORT TERM GOAL #4   Title Patient will demonstrate ability to maintain static standing for 30s with BUE support on RW and min assist to indicate a decrease in risk of falling. (TARGET DATE: 10/21/2016)    Time 4   Period Weeks   Status Achieved           PT Long Term Goals - 11/02/16 1825      PT LONG TERM GOAL #1   Title Patient will verbalize understanding and return demonstration for ongoing HEP. (TARGET DATE: 11/25/2016)    Time 10   Period Weeks   Status On-going      PT LONG TERM GOAL #2   Title Patient will demonstrate ability to safely maintain trunk control sitting balance static and reaches 6" modified indepedent to indicate a decrease in her risk of falling. (TARGET DATE: 11/25/2016)    Time 10   Period Weeks  Status On-going     PT LONG TERM GOAL #3   Title Patient will be able to transfer w/c to mat modified independent to indicate a decrease in her risk of falling during transfers. (TARGET DATE: 11/25/2016)    Time 10   Period Weeks   Status On-going     PT LONG TERM GOAL #4   Title Patient will demonstrate ability to climb stairs (1 rail) and min A to indicate a decreae in caregiver burden and a decrease in her risk of falling when climbing the stairs to get to the bedroom and bathroom at home. (TARGET DATE: 11/25/2016)    Time 10   Period Weeks   Status On-going     PT LONG TERM GOAL #5   Title Patient will demonstrate ability to ambulate 58ft with LRAD and min A to indicate improvement in ability to ambulate household distances with assistance. (TARGET DATE: 11/25/2016)    Time 10   Period Weeks   Status On-going     PT LONG TERM GOAL #6   Title Patient will improvem FOTO score by >/= 10% to indicate improvement in self-perceived functional mobility. (TARGET DATE: 11/25/2016)    Time 10   Period Weeks   Status On-going               Plan - 11/02/16 1825    Clinical Impression Statement Patient was able to ambulate longer distance & less assist than last time this PT worked with her. Patient had improved sitting posture in w/c until PT made note of improved head control. PT will see if some of OT or speech visits can be converted to PT to enable >20 PT visits as she is progressing with skilled care.    Rehab Potential Good   Clinical Impairments Affecting Rehab Potential HTN, cervical spondylosis, C6-7 L side disc protrusion causing L foraminal impingement, anemia, functional neurological disorder, anemia     PT Frequency 2x /  week   PT Duration Other (comment)   PT Treatment/Interventions ADLs/Self Care Home Management;Electrical Stimulation;Neuromuscular re-education;Balance training;Therapeutic exercise;Therapeutic activities;Functional mobility training;Stair training;Gait training;DME Instruction;Patient/family education;Energy conservation   PT Next Visit Plan Continue trunk control without UE support in sitting without back support. progress stair negotiation and gait training with w/c follow and RW weighted down (encouraging smaller, quieter steps seemed successful); use air cast for R ankle support; progress balance activities with emphasis on trunk control to maintain upright posture (? repeat tall kneeling); utilize graded movement to foster proper motor control.  work extremities in closed chain for increased proprioception and control?try quadruped   Consulted and Agree with Plan of Care Patient;Family member/caregiver   Family Member Consulted 2 dtrs      Patient will benefit from skilled therapeutic intervention in order to improve the following deficits and impairments:  Decreased activity tolerance, Decreased balance, Decreased mobility, Decreased knowledge of use of DME, Decreased endurance, Decreased strength, Impaired flexibility, Impaired tone  Visit Diagnosis: Other symptoms and signs involving the nervous system  Unsteadiness on feet  Other abnormalities of gait and mobility  Muscle weakness (generalized)     Problem List Patient Active Problem List   Diagnosis Date Noted  . Abnormal laboratory test 09/09/2016  . Gait abnormality 08/03/2016  . Weakness 08/03/2016  . Paresthesia 08/03/2016  . S/P cervical spinal fusion 10/23/2015  . Hypersomnia 12/08/2010  . 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  . EPIGASTRIC PAIN 10/13/2009  . IRRITABLE BOWEL SYNDROME 03/10/2009  . BACK PAIN, LUMBAR 07/01/2008  . COSTOCHONDRITIS  02/01/2008  . VIRAL URI 01/08/2008  . CHEST PAIN 01/08/2008  . OTHER  SPECIFIED EPISODIC MOOD DISORDER 12/25/2007  . ARM PAIN 12/25/2007  . ANEMIA-NOS 10/02/2007  . Essential hypertension 10/02/2007  . GERD 10/02/2007  . POLYP, GALLBLADDER 10/02/2007  . UTERINE POLYP 10/02/2007  . OSTEOARTHRITIS 10/02/2007  . Dyskinesia 10/02/2007  . HEADACHE 10/02/2007  . PALPITATIONS 10/02/2007  . CARDIAC MURMUR, HX OF 10/02/2007  . NEPHROLITHIASIS, HX OF 10/02/2007    Jamey Reas PT, DPT 11/03/2016, 12:44 PM  [facility] [address] [place], [place], [address] Phone: [phone]   Fax:  [phone]  Name: Amy Barry MRN: [id number] Date of Birth: 09/25/1970

## 2016-11-04 ENCOUNTER — Ambulatory Visit: Payer: 59 | Admitting: Physical Therapy

## 2016-11-07 ENCOUNTER — Ambulatory Visit: Payer: 59 | Admitting: Physical Therapy

## 2016-11-07 ENCOUNTER — Encounter: Payer: Self-pay | Admitting: Physical Therapy

## 2016-11-07 DIAGNOSIS — R29818 Other symptoms and signs involving the nervous system: Secondary | ICD-10-CM

## 2016-11-07 DIAGNOSIS — R2681 Unsteadiness on feet: Secondary | ICD-10-CM

## 2016-11-07 DIAGNOSIS — R2689 Other abnormalities of gait and mobility: Secondary | ICD-10-CM

## 2016-11-07 DIAGNOSIS — M6281 Muscle weakness (generalized): Secondary | ICD-10-CM

## 2016-11-07 NOTE — Therapy (Signed)
[facility] [address] [place], [place], [address] Phone: [phone]   Fax:  [phone]  Physical Therapy Treatment  Patient Details  Name: Amy Barry MRN: [id number] Date of Birth: 09-08-1970 Referring Provider: Donald Prose MD   Encounter Date: 11/07/2016      PT End of Session - 11/07/16 2243    Visit Number 14   Number of Visits 20   Date for PT Re-Evaluation 11/25/16   Authorization Type [employer]    Authorization - Visit Number 14   Authorization - Number of Visits 60   PT Start Time 1532   PT Stop Time 1616   PT Time Calculation (min) 44 min   Equipment Utilized During Treatment Other (comment)  Rt air cast   Activity Tolerance Patient tolerated treatment well   Behavior During Therapy WFL for tasks assessed/performed      Past Medical History:  Diagnosis Date  . Anemia   . Dysrhythmia    pvc's holter monitor started lopressor- no return visit  . GERD (gastroesophageal reflux disease)   . Headache(784.0)   . Heart murmur    child no echo  . History of kidney stones   . Hypertension   . IBS (irritable bowel syndrome)   . Low vitamin D level   . Lower extremity weakness   . Neuromuscular disorder ([place])   . Numbness and tingling   . Osteoarthritis   . Pneumonia    January- tx with IV antibiotics  for pneumonia in 04/2015   . PVC (premature ventricular contraction)   . Seizure disorder ([place])    denied seizures- has dyskinesia  . Sleep concern 2013   toldf that she doesn 't have apnea but she stays sleeping a lot  . Upper extremity weakness     Past Surgical History:  Procedure Laterality Date  . 2 ovarian cysts     age 73  . CERVICAL DISC ARTHROPLASTY N/A 10/23/2015   Procedure: Cervical Disc Arthroplasty Cervical six-seven;  Surgeon: Eustace Moore, MD;  Location: [facility];  Service: Neurosurgery;  Laterality: N/A;  . ESOPHAGOGASTRODUODENOSCOPY     for GERD  . LAPAROSCOPIC  ABDOMINAL EXPLORATION     dx ibs  . TUBAL LIGATION      There were no vitals filed for this visit.      Subjective Assessment - 11/07/16 1535    Subjective Her buttocks & back of thighs are burning. It started on Friday with Sunday the worst. She sees neurologist in 2 days for 2nd opinion.    Patient is accompained by: Family member   Limitations Sitting;Lifting;Standing;Walking;House hold activities   How long can you sit comfortably? 2 hrs prior to onset of pain    Patient Stated Goals sitting upright, walking, driving, cooking, cleaning, return to work, walk the dog    Currently in Pain? Yes   Pain Score 7    Pain Location Buttocks   Pain Orientation Posterior   Pain Descriptors / Indicators Burning;Aching   Pain Type Chronic pain   Pain Onset 1 to 4 weeks ago   Pain Frequency Constant   Aggravating Factors  buttocks & posterior thigh increased 4 days ago.    Pain Relieving Factors medications, rest in side lying                         [facility] Adult PT Treatment/Exercise - 11/07/16 1530      Transfers  Transfers Sit to Stand;Stand to Sit   Sit to Stand 4: Min assist;With upper extremity assist;With armrests;From chair/3-in-1  to RW, increased time to enable >pt participation    Sit to Stand Details Tactile cues for weight shifting;Verbal cues for technique   Stand to Sit 4: Min assist;With upper extremity assist;With armrests;To chair/3-in-1;Uncontrolled descent  from RW   Stand to Sit Details (indicate cue type and reason) Tactile cues for weight shifting;Verbal cues for technique     Ambulation/Gait   Ambulation/Gait Yes   Ambulation/Gait Assistance 3: Mod assist   Ambulation/Gait Assistance Details manual & verbal cues on posture, step length and ataxic control.  Rollator Luce use & safety including brakes & position within.   w/c not following patient.    Ambulation Distance (Feet) 70 Feet  70', 30' & 25'   Assistive device Rolling [place];Other  (Comment);Rollator  aircast on RLE   Gait Pattern Step-through pattern;Decreased stride length;Right hip hike;Left hip hike;Right steppage;Left steppage;Ataxic;Trunk flexed;Wide base of support   Ambulation Surface Indoor;Level   Ramp 3: Mod assist  2nd person to manage std RW   Ramp Details (indicate cue type and reason) verbal, tactile & manual cues on technique including posture, RW control and wt shift.    Curb 2: Max assist  2nd person to manage std RW   Curb Details (indicate cue type and reason) verbal, tactile & manual cues on posture, balance reaction, sequence and sequence     Self-Care   Self-Care --   Other Self-Care Comments  --     Neuro Re-ed    Neuro Re-ed Details  --     Knee/Hip Exercises: Aerobic   Nustep Level 3 with BUE & BLEs 3 min with verbal cues on fluency & full ROM                PT Education - 11/07/16 1530    Education provided Yes   Education Details rollator Bourn safety   Person(s) Educated Patient   Methods Explanation;Demonstration;Verbal cues   Comprehension Verbalized understanding;Returned demonstration;Verbal cues required;Need further instruction          PT Short Term Goals - 10/22/16 1248      PT SHORT TERM GOAL #1   Title Patient will verbalize understanding and return demonstration for initial HEP to improve LE strength and balance. (TARGET DATE: 10/21/2016)    Time 4   Period Weeks   Status Achieved     PT SHORT TERM GOAL #2   Title Patient will be able to safely maintain upright posture sitting edge of bed with min A for 1 minute to indicate improvement in trunk control. (TARGET DATE: 10/21/2016)    Time 4   Period Weeks   Status Achieved     PT SHORT TERM GOAL #3   Title Patient will demonstrate ability to transfer from w/c to mat with min A to indicate improvement in functional mobility and a decrease in her risk of falling. (TARGET DATE: 10/21/2016)    Time 4   Period Weeks   Status Achieved     PT SHORT TERM  GOAL #4   Title Patient will demonstrate ability to maintain static standing for 30s with BUE support on RW and min assist to indicate a decrease in risk of falling. (TARGET DATE: 10/21/2016)    Time 4   Period Weeks   Status Achieved           PT Long Term Goals - 11/02/16 1825  PT LONG TERM GOAL #1   Title Patient will verbalize understanding and return demonstration for ongoing HEP. (TARGET DATE: 11/25/2016)    Time 10   Period Weeks   Status On-going     PT LONG TERM GOAL #2   Title Patient will demonstrate ability to safely maintain trunk control sitting balance static and reaches 6" modified indepedent to indicate a decrease in her risk of falling. (TARGET DATE: 11/25/2016)    Time 10   Period Weeks   Status On-going     PT LONG TERM GOAL #3   Title Patient will be able to transfer w/c to mat modified independent to indicate a decrease in her risk of falling during transfers. (TARGET DATE: 11/25/2016)    Time 10   Period Weeks   Status On-going     PT LONG TERM GOAL #4   Title Patient will demonstrate ability to climb stairs (1 rail) and min A to indicate a decreae in caregiver burden and a decrease in her risk of falling when climbing the stairs to get to the bedroom and bathroom at home. (TARGET DATE: 11/25/2016)    Time 10   Period Weeks   Status On-going     PT LONG TERM GOAL #5   Title Patient will demonstrate ability to ambulate 47ft with LRAD and min A to indicate improvement in ability to ambulate household distances with assistance. (TARGET DATE: 11/25/2016)    Time 10   Period Weeks   Status On-going     PT LONG TERM GOAL #6   Title Patient will improvem FOTO score by >/= 10% to indicate improvement in self-perceived functional mobility. (TARGET DATE: 11/25/2016)    Time 10   Period Weeks   Status On-going               Plan - 11/07/16 2244    Clinical Impression Statement Patient has improved understanding of rollator Sultan use which is type  she reports using upstairs. Negotiation of ramps & curbs with [place] with skilled instruction in technique    Rehab Potential Good   Clinical Impairments Affecting Rehab Potential HTN, cervical spondylosis, C6-7 L side disc protrusion causing L foraminal impingement, anemia, functional neurological disorder, anemia     PT Frequency 2x / week   PT Duration Other (comment)   PT Treatment/Interventions ADLs/Self Care Home Management;Electrical Stimulation;Neuromuscular re-education;Balance training;Therapeutic exercise;Therapeutic activities;Functional mobility training;Stair training;Gait training;DME Instruction;Patient/family education;Energy conservation   PT Next Visit Plan Continue trunk control without UE support in sitting without back support. progress stair negotiation and gait training with w/c follow and RW weighted down (encouraging smaller, quieter steps seemed successful); use air cast for R ankle support; progress balance activities with emphasis on trunk control to maintain upright posture (? repeat tall kneeling); utilize graded movement to foster proper motor control.  work extremities in closed chain for increased proprioception and control?try quadruped   Consulted and Agree with Plan of Care Patient;Family member/caregiver   Family Member Consulted 2 dtrs      Patient will benefit from skilled therapeutic intervention in order to improve the following deficits and impairments:  Decreased activity tolerance, Decreased balance, Decreased mobility, Decreased knowledge of use of DME, Decreased endurance, Decreased strength, Impaired flexibility, Impaired tone  Visit Diagnosis: Other symptoms and signs involving the nervous system  Unsteadiness on feet  Other abnormalities of gait and mobility  Muscle weakness (generalized)     Problem List Patient Active Problem List   Diagnosis Date Noted  .  Abnormal laboratory test 09/09/2016  . Gait abnormality 08/03/2016  . Weakness  08/03/2016  . Paresthesia 08/03/2016  . S/P cervical spinal fusion 10/23/2015  . Hypersomnia 12/08/2010  . 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  . EPIGASTRIC PAIN 10/13/2009  . IRRITABLE BOWEL SYNDROME 03/10/2009  . BACK PAIN, LUMBAR 07/01/2008  . COSTOCHONDRITIS 02/01/2008  . VIRAL URI 01/08/2008  . CHEST PAIN 01/08/2008  . 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 ARM PAIN 12/25/2007  . ANEMIA-NOS 10/02/2007  . Essential hypertension 10/02/2007  . GERD 10/02/2007  . POLYP, GALLBLADDER 10/02/2007  . UTERINE POLYP 10/02/2007  . OSTEOARTHRITIS 10/02/2007  . Dyskinesia 10/02/2007  . HEADACHE 10/02/2007  . PALPITATIONS 10/02/2007  . CARDIAC MURMUR, HX OF 10/02/2007  . NEPHROLITHIASIS, HX OF 10/02/2007    Jamey Reas PT, DPT 11/07/2016, 10:47 PM  [facility] [address] [place], [place], [address] Phone: [phone]   Fax:  [phone]  Name: Amy Barry MRN: [id number] Date of Birth: Nov 13, 1970

## 2016-11-07 NOTE — Patient Instructions (Addendum)
Piriformis Syndrome Piriformis syndrome is a condition that can cause pain and numbness in your buttocks and down the back of your leg. Piriformis syndrome happens when the small muscle that connects the base of your spine to your hip (piriformis muscle) presses on the nerve that runs down the back of your leg (sciatic nerve). The piriformis muscle helps your hip rotate and helps to bring your leg back and out. It also helps shift your weight while you are walking to keep you stable. The sciatic nerve runs under or through the piriformis. Damage to the piriformis muscle can cause spasms that put pressure on the nerve below. This causes pain and discomfort while sitting and moving. The pain may feel as if it begins in the buttock and spreads (radiates) down your hip and thigh. What are the causes? This condition is caused by pressure on the sciatic nerve from the piriformis muscle. The piriformis muscle can get irritated with overuse, especially if other hip muscles are weak and the piriformis has to do extra work. Piriformis syndrome can also occur after an injury, like a fall onto your buttocks. What increases the risk? This condition is more likely to develop in:  Women.  People who sit for long periods of time.  Cyclists.  People who have weak buttocks muscles (gluteal muscles).  What are the signs or symptoms? Pain, tingling, or numbness that starts in the buttock and runs down the back of your leg (sciatica) is the most common symptom of this condition. Your symptoms may:  Get worse the longer you sit.  Get worse when you walk, run, or go up on stairs.  How is this diagnosed? This condition is diagnosed based on your symptoms, medical history, and physical exam. During this exam, your health care provider may move your leg into different positions to check for pain. He or she will also press on the muscles of your hip and buttock to see if that increases your symptoms. You may also have  an X-ray or MRI. How is this treated? Treatment for this condition may include:  Stopping all activities that cause pain or make your condition worse.  Using heat or ice to relieve pain as told by your health care provider.  Taking medicines to reduce pain and swelling.  Taking a muscle relaxer to release the piriformis muscle.  Doing range-of-motion and strengthening exercises (physical therapy) as told by your health care provider.  Massaging the affected area.  Getting an injection of an anti-inflammatory medicine or muscle relaxer to reduce inflammation and muscle tension.  In rare cases, you may need surgery to cut the muscle and release pressure on the nerve if other treatments do not work. Follow these instructions at home:  Take over-the-counter and prescription medicines only as told by your health care provider.  Do not sit for long periods. Get up and walk around every 20 minutes or as often as told by your health care provider.  If directed, apply heat to the affected area as often as told by your health care provider. Use the heat source that your health care provider recommends, such as a moist heat pack or a heating pad. ? Place a towel between your skin and the heat source. ? Leave the heat on for 20-30 minutes. ? Remove the heat if your skin turns bright red. This is especially important if you are unable to feel pain, heat, or cold. You may have a greater risk of getting burned.  If   directed, apply ice to the injured area. ? Put ice in a plastic bag. ? Place a towel between your skin and the bag. ? Leave the ice on for 20 minutes, 2-3 times a day.  Do exercises as told by your health care provider.  Return to your normal activities as told by your health care provider. Ask your health care provider what activities are safe for you.  Keep all follow-up visits as told by your health care provider. This is important. How is this prevented?  Do not sit for  longer than 20 minutes at a time. When you sit, choose padded surfaces.  Warm up and stretch before being active.  Cool down and stretch after being active.  Give your body time to rest between periods of activity.  Make sure to use equipment that fits you.  Maintain physical fitness, including: ? Strength. ? Flexibility. Contact a health care provider if:  Your pain and stiffness continue or get worse.  Your leg or hip becomes weak.  You have changes in your bowel function or bladder function. This information is not intended to replace advice given to you by your health care provider. Make sure you discuss any questions you have with your health care provider.  Hold stretches 20-30 seconds, 2-3 reps on each leg. 2-3 times per day.   Piriformis Stretch - Supine    Pull uninvolved knee across body toward opposite shoulder. Hold slight stretch for ___ seconds. Repeat with involved leg. Repeat ___ times. Do ___ times per day.  Copyright  VHI. All rights reserved.  Piriformis Stretch - Supine    Pull uninvolved knee across body to opposite shoulder. Hold slight stretch for ___ seconds. Repeat with involved leg. Repeat ___ times. Do ___ times per day.  Copyright  VHI. All rights reserved.  Piriformis Stretch    Lying on back, pull right knee toward opposite shoulder. Hold ____ seconds. Repeat ____ times. Do ____ sessions per day.  http://gt2.exer.us/258   Copyright  VHI. All rights reserved.  Piriformis Stretch, Sitting    Sit, one ankle on opposite knee, same-side hand on crossed knee. Push down on knee, keeping spine straight. Lean torso forward, with flat back, until tension is felt in hamstrings and gluteals of crossed-leg side. Hold ___ seconds.  Repeat ___ times per session. Do ___ sessions per day.  Copyright  VHI. All rights reserved.  Piriformis Stretch, Supine    Lie supine, legs bent, feet flat. Raise one bent leg and, grasping ankle with both  hands, pull leg toward opposite shoulder. Hold ___ seconds.  Repeat ___ times per session. Do ___ sessions per day. Perform with other leg straight.  Copyright  VHI. All rights reserved.  Piriformis Stretch, Supine    Lie supine, one ankle crossed onto opposite knee. Holding bottom leg behind knee, gently pull legs toward chest until stretch is felt in buttock of top leg. Hold ___ seconds. For deeper stretch gently push top knee away from body.  Repeat ___ times per session. Do ___ sessions per day.  Copyright  VHI. All rights reserved.  Piriformis Stretch, Supine With Partner PNF    Lie supine, one ankle crossed onto opposite knee. Partner pushes top leg toward chest until tension is felt in hamstring and gluteals of crossed-leg side. While partner holds position, contract hip muscles so knee rotates away from chest. Hold ___ seconds, Release tension. Partner presses leg closer to chest. Hold ___ seconds. Repeat ___ times per session. Do ___  sessions per day.  Copyright  VHI. All rights reserved.   Document Released: 03/21/2005 Document Revised: 11/24/2015 Document Reviewed: 03/03/2015 Elsevier Interactive Patient Education  2018 [employer]. Stretching: Piriformis    Cross left leg over other thigh and place elbow over outside of knee. Gently stretch buttock muscles by pushing bent knee across body. Hold ____ seconds. Repeat ____ times per set. Do ____ sets per session. Do ____ sessions per day.  http://orth.exer.us/651   Copyright  VHI. All rights reserved.  Trunk Rotation (Piriformis Stretch)    Lie on side with top knee pulled up toward chest. Hold knee down with opposite arm and rotate trunk away. Repeat on other side. Hold ____ seconds each side. Repeat ____ times. Do ____ sessions per day.  Copyright  VHI. All rights reserved.

## 2016-11-09 DIAGNOSIS — R269 Unspecified abnormalities of gait and mobility: Secondary | ICD-10-CM | POA: Diagnosis not present

## 2016-11-09 DIAGNOSIS — R252 Cramp and spasm: Secondary | ICD-10-CM | POA: Diagnosis not present

## 2016-11-11 ENCOUNTER — Ambulatory Visit: Payer: 59 | Admitting: Physical Therapy

## 2016-11-11 ENCOUNTER — Encounter: Payer: 59 | Admitting: Occupational Therapy

## 2016-11-11 ENCOUNTER — Ambulatory Visit: Payer: 59 | Admitting: Rehabilitation

## 2016-11-11 ENCOUNTER — Encounter: Payer: Self-pay | Admitting: Rehabilitation

## 2016-11-11 DIAGNOSIS — M6281 Muscle weakness (generalized): Secondary | ICD-10-CM

## 2016-11-11 DIAGNOSIS — R2689 Other abnormalities of gait and mobility: Secondary | ICD-10-CM

## 2016-11-11 DIAGNOSIS — R29818 Other symptoms and signs involving the nervous system: Secondary | ICD-10-CM | POA: Diagnosis not present

## 2016-11-11 DIAGNOSIS — R2681 Unsteadiness on feet: Secondary | ICD-10-CM

## 2016-11-11 NOTE — Therapy (Signed)
[facility] [address] [place], [place], [address] Phone: [phone]   Fax:  [phone]  Physical Therapy Treatment  Patient Details  Name: Amy Barry MRN: [id number] Date of Birth: 08/21/1970 Referring Provider: Donald Prose MD   Encounter Date: 11/11/2016      PT End of Session - 11/11/16 2042    Visit Number 15   Number of Visits 20   Date for PT Re-Evaluation 11/25/16   Authorization Type [place] - Visit Number 15   Authorization - Number of Visits 60   PT Start Time 1616   PT Stop Time 1700   PT Time Calculation (min) 44 min   Equipment Utilized During Treatment Other (comment)  Rt air cast   Activity Tolerance Patient tolerated treatment well   Behavior During Therapy WFL for tasks assessed/performed      Past Medical History:  Diagnosis Date  . Anemia   . Dysrhythmia    pvc's holter monitor started lopressor- no return visit  . GERD (gastroesophageal reflux disease)   . Headache(784.0)   . Heart murmur    child no echo  . History of kidney stones   . Hypertension   . IBS (irritable bowel syndrome)   . Low vitamin D level   . Lower extremity weakness   . Neuromuscular disorder ([place])   . Numbness and tingling   . Osteoarthritis   . Pneumonia    January- tx with IV antibiotics  for pneumonia in 04/2015   . PVC (premature ventricular contraction)   . Seizure disorder ([place])    denied seizures- has dyskinesia  . Sleep concern 2013   toldf that she doesn 't have apnea but she stays sleeping a lot  . Upper extremity weakness     Past Surgical History:  Procedure Laterality Date  . 2 ovarian cysts     age 67  . CERVICAL DISC ARTHROPLASTY N/A 10/23/2015   Procedure: Cervical Disc Arthroplasty Cervical six-seven;  Surgeon: Eustace Moore, MD;  Location: [facility];  Service: Neurosurgery;  Laterality: N/A;  . ESOPHAGOGASTRODUODENOSCOPY     for GERD  . LAPAROSCOPIC  ABDOMINAL EXPLORATION     dx ibs  . TUBAL LIGATION      There were no vitals filed for this visit.      Subjective Assessment - 11/11/16 1621    Subjective Reports getting lumbar puncture done, is waiting for clearance from insurance.     Patient is accompained by: Family member   Limitations Sitting;Lifting;Standing;Walking;House hold activities   How long can you sit comfortably? 2 hrs prior to onset of pain    Patient Stated Goals sitting upright, walking, driving, cooking, cleaning, return to work, walk the dog    Currently in Pain? Yes   Pain Score 7    Pain Location Generalized   Pain Orientation Right   Pain Descriptors / Indicators Aching;Burning   Pain Type Chronic pain   Pain Onset 1 to 4 weeks ago   Pain Frequency Constant            NMR:  Began session working in quadruped position to reduce ataxic like movements and improve proximal and core control and activation.  While in quadruped, had pt alternate UE into extension x 10 reps with PT providing facilitation at trunk for improved core activation as she tends to demo increased lumbar lordosis.  Also provided tactile cues at head/shoulders to decrease overt compensatory movements.  Progressed to quadruped with very slow alternating leg lifts (with knee still in flexion).  Tactile facilitation at pelvis to reduce overt lateral movements and cues for improved UE support and neutral head throughout, but overall did very well.  Transitioned to tall kneeling with UE support on Kaye bench working on elevating UE into air with heavy initially fading to min/mod facilitation at trunk for reduced lumbar lordosis and heavy facilitation at head/shoulders to reduce extension.  Performed x 5 reps on each side.  Performed mini squats in tall kneeling, again with tactile facilitation and head as mentioned above x 7 reps. Ended session seated on green physioball (+2 A to get into position and maintain position throughout).  While on ball  worked on very small controlled movements laterally with hips and circular patterns.  Max cues and assist fading to more min/mod to decrease head/shoulders/UE use.  Pt tolerated very well.                       PT Education - 11/11/16 1623    Education provided Yes   Education Details purpose of NMR tasks   Person(s) Educated Patient   Methods Explanation   Comprehension Verbalized understanding          PT Short Term Goals - 10/22/16 1248      PT SHORT TERM GOAL #1   Title Patient will verbalize understanding and return demonstration for initial HEP to improve LE strength and balance. (TARGET DATE: 10/21/2016)    Time 4   Period Weeks   Status Achieved     PT SHORT TERM GOAL #2   Title Patient will be able to safely maintain upright posture sitting edge of bed with min A for 1 minute to indicate improvement in trunk control. (TARGET DATE: 10/21/2016)    Time 4   Period Weeks   Status Achieved     PT SHORT TERM GOAL #3   Title Patient will demonstrate ability to transfer from w/c to mat with min A to indicate improvement in functional mobility and a decrease in her risk of falling. (TARGET DATE: 10/21/2016)    Time 4   Period Weeks   Status Achieved     PT SHORT TERM GOAL #4   Title Patient will demonstrate ability to maintain static standing for 30s with BUE support on RW and min assist to indicate a decrease in risk of falling. (TARGET DATE: 10/21/2016)    Time 4   Period Weeks   Status Achieved           PT Long Term Goals - 11/02/16 1825      PT LONG TERM GOAL #1   Title Patient will verbalize understanding and return demonstration for ongoing HEP. (TARGET DATE: 11/25/2016)    Time 10   Period Weeks   Status On-going     PT LONG TERM GOAL #2   Title Patient will demonstrate ability to safely maintain trunk control sitting balance static and reaches 6" modified indepedent to indicate a decrease in her risk of falling. (TARGET DATE: 11/25/2016)     Time 10   Period Weeks   Status On-going     PT LONG TERM GOAL #3   Title Patient will be able to transfer w/c to mat modified independent to indicate a decrease in her risk of falling during transfers. (TARGET DATE: 11/25/2016)    Time 10   Period Weeks   Status On-going     PT LONG  TERM GOAL #4   Title Patient will demonstrate ability to climb stairs (1 rail) and min A to indicate a decreae in caregiver burden and a decrease in her risk of falling when climbing the stairs to get to the bedroom and bathroom at home. (TARGET DATE: 11/25/2016)    Time 10   Period Weeks   Status On-going     PT LONG TERM GOAL #5   Title Patient will demonstrate ability to ambulate 35ft with LRAD and min A to indicate improvement in ability to ambulate household distances with assistance. (TARGET DATE: 11/25/2016)    Time 10   Period Weeks   Status On-going     PT LONG TERM GOAL #6   Title Patient will improvem FOTO score by >/= 10% to indicate improvement in self-perceived functional mobility. (TARGET DATE: 11/25/2016)    Time 10   Period Weeks   Status On-going               Plan - 11/11/16 2043    Clinical Impression Statement Skilled session focused on NMR in quadruped, tall kneeling and on physioball in order to address improved proximal and trunk control/core activation.  Pt tolerated very well.  Note that she tends to lead all movements with head/shoulders and needs cues and facilitation to decrease this during all tasks.     Rehab Potential Good   Clinical Impairments Affecting Rehab Potential HTN, cervical spondylosis, C6-7 L side disc protrusion causing L foraminal impingement, anemia, functional neurological disorder, anemia     PT Frequency 2x / week   PT Duration Other (comment)   PT Treatment/Interventions ADLs/Self Care Home Management;Electrical Stimulation;Neuromuscular re-education;Balance training;Therapeutic exercise;Therapeutic activities;Functional mobility training;Stair  training;Gait training;DME Instruction;Patient/family education;Energy conservation   PT Next Visit Plan Continue trunk control without UE support in sitting without back support. progress stair negotiation and gait training with w/c follow and RW weighted down (encouraging smaller, quieter steps seemed successful); use air cast for R ankle support; progress balance activities with emphasis on trunk control to maintain upright posture (? repeat tall kneeling); utilize graded movement to foster proper motor control.  work extremities in closed chain for increased proprioception and control?try quadruped   Consulted and Agree with Plan of Care Patient;Family member/caregiver   Family Member Consulted 2 dtrs      Patient will benefit from skilled therapeutic intervention in order to improve the following deficits and impairments:  Decreased activity tolerance, Decreased balance, Decreased mobility, Decreased knowledge of use of DME, Decreased endurance, Decreased strength, Impaired flexibility, Impaired tone  Visit Diagnosis: Other symptoms and signs involving the nervous system  Unsteadiness on feet  Other abnormalities of gait and mobility  Muscle weakness (generalized)     Problem List Patient Active Problem List   Diagnosis Date Noted  . Abnormal laboratory test 09/09/2016  . Gait abnormality 08/03/2016  . Weakness 08/03/2016  . Paresthesia 08/03/2016  . S/P cervical spinal fusion 10/23/2015  . Hypersomnia 12/08/2010  . 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  . EPIGASTRIC PAIN 10/13/2009  . IRRITABLE BOWEL SYNDROME 03/10/2009  . BACK PAIN, LUMBAR 07/01/2008  . COSTOCHONDRITIS 02/01/2008  . VIRAL URI 01/08/2008  . CHEST PAIN 01/08/2008  . 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 ARM PAIN 12/25/2007  . ANEMIA-NOS 10/02/2007  . Essential hypertension 10/02/2007  . GERD 10/02/2007  . POLYP, GALLBLADDER 10/02/2007  . UTERINE POLYP 10/02/2007  . OSTEOARTHRITIS 10/02/2007   . Dyskinesia 10/02/2007  . HEADACHE 10/02/2007  . PALPITATIONS 10/02/2007  . CARDIAC MURMUR,  HX OF 10/02/2007  . NEPHROLITHIASIS, HX OF 10/02/2007    Cameron Sprang, PT, MPT [facility] [address] [place], [place], [address] Phone: [phone]   Fax:  [phone] 11/11/16, 8:55 PM  Name: Amy Barry MRN: [id number] Date of Birth: 1970-05-02

## 2016-11-14 ENCOUNTER — Encounter: Payer: Self-pay | Admitting: Physical Therapy

## 2016-11-14 ENCOUNTER — Ambulatory Visit: Payer: 59 | Admitting: Physical Therapy

## 2016-11-14 DIAGNOSIS — R2681 Unsteadiness on feet: Secondary | ICD-10-CM

## 2016-11-14 DIAGNOSIS — R2689 Other abnormalities of gait and mobility: Secondary | ICD-10-CM

## 2016-11-14 DIAGNOSIS — R29818 Other symptoms and signs involving the nervous system: Secondary | ICD-10-CM | POA: Diagnosis not present

## 2016-11-14 DIAGNOSIS — M6281 Muscle weakness (generalized): Secondary | ICD-10-CM

## 2016-11-14 NOTE — Therapy (Signed)
[facility] [address] [place], [place], [address] Phone: [phone]   Fax:  [phone]  Physical Therapy Treatment  Patient Details  Name: Amy Barry MRN: [id number] Date of Birth: 1970/10/12 Referring Provider: Donald Prose MD   Encounter Date: 11/14/2016      PT End of Session - 11/14/16 2140    Visit Number 16   Number of Visits 20   Date for PT Re-Evaluation 11/25/16   Authorization Type [place] - Visit Number 16   Authorization - Number of Visits 60   PT Start Time 9169   PT Stop Time 1623   PT Time Calculation (min) 48 min   Equipment Utilized During Treatment Gait belt   Activity Tolerance Patient tolerated treatment well;Patient limited by fatigue   Behavior During Therapy WFL for tasks assessed/performed      Past Medical History:  Diagnosis Date  . Anemia   . Dysrhythmia    pvc's holter monitor started lopressor- no return visit  . GERD (gastroesophageal reflux disease)   . Headache(784.0)   . Heart murmur    child no echo  . History of kidney stones   . Hypertension   . IBS (irritable bowel syndrome)   . Low vitamin D level   . Lower extremity weakness   . Neuromuscular disorder ([place])   . Numbness and tingling   . Osteoarthritis   . Pneumonia    January- tx with IV antibiotics  for pneumonia in 04/2015   . PVC (premature ventricular contraction)   . Seizure disorder ([place])    denied seizures- has dyskinesia  . Sleep concern 2013   toldf that she doesn 't have apnea but she stays sleeping a lot  . Upper extremity weakness     Past Surgical History:  Procedure Laterality Date  . 2 ovarian cysts     age 71  . CERVICAL DISC ARTHROPLASTY N/A 10/23/2015   Procedure: Cervical Disc Arthroplasty Cervical six-seven;  Surgeon: Eustace Moore, MD;  Location: [facility];  Service: Neurosurgery;  Laterality: N/A;  . ESOPHAGOGASTRODUODENOSCOPY     for GERD  .  LAPAROSCOPIC ABDOMINAL EXPLORATION     dx ibs  . TUBAL LIGATION      There were no vitals filed for this visit.      Subjective Assessment - 11/14/16 1539    Subjective Insurance still has not approved lumbar puncture. Her goal is to return to work mid to late Sept. 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 She needs to attend a class for EPIC training 8hrs for 2days.    Limitations Sitting;Lifting;Standing;Walking;House hold activities   How long can you sit comfortably? 2 hrs prior to onset of pain    Patient Stated Goals sitting upright, walking, driving, cooking, cleaning, return to work, walk the dog    Currently in Pain? Yes   Pain Score 5    Pain Location Generalized   Pain Orientation Right;Other (Comment)  all over but more intense on right side of body   Pain Descriptors / Indicators Aching;Burning   Pain Type Chronic pain   Pain Onset More than a month ago   Pain Frequency Constant   Aggravating Factors  reports right breast is more sensitive   Pain Relieving Factors medications, rest in side lying  [facility] Adult PT Treatment/Exercise - 11/14/16 1535      Transfers   Transfers Sit to Stand;Stand to Sit   Sit to Stand 4: Min guard;With upper extremity assist;With armrests;From chair/3-in-1  to RW for stabilization   Sit to Stand Details (indicate cue type and reason) tactile & verbal cues on wt shift & sequence   Stand to Sit 5: Supervision;With upper extremity assist;With armrests;To chair/3-in-1  from RW for stabilization   Stand to Sit Details verbal cues on sequence and controlling descent     Ambulation/Gait   Ambulation/Gait Yes   Ambulation/Gait Assistance 3: Mod assist;4: Min assist   Ambulation/Gait Assistance Details tactile & verbal cues on posture, step control visualizing placement location.    Ambulation Distance (Feet) 75 Feet   Assistive device Rolling [place]  No ankle air cast with  no ankle supination   Ambulation Surface Indoor;Level   Stairs Yes   Stairs Assistance 3: Mod assist   Stairs Assistance Details (indicate cue type and reason) demo, verbal & tactile cues on sequence, turning 45* towards single rail with 2 hand support (simulating home situation), and descending forward facing.    Stair Management Technique One rail Right;One rail Left;Step to pattern;Forwards  2 hands on 1 rail with 45* turn   Number of Stairs 4  1 rep with rt rail & 1 rep with lt rail   Ramp 3: Mod assist  2 people for safety, 2nd person supervision only at RW   Ramp Details (indicate cue type and reason) verbal & tactile cues on technique including RW management, posture & wt shift.    Curb 2: Max assist  1 person modA on body & 1 person to manage RW                PT Education - 11/14/16 1545    Education provided Yes   Education Details sitting posture, positioning computer screen & keyboard for optimal ergonomics;  Theracane for trigger points; returning to work with progressively increasing time, may start 50% with split hours and increase time as tolerated. Trial sitting at computer with timer limiting to 1 hr max initially;  Will be left alone during day when daughters return to school in 2 weeks. Try 1-2 hrs when can return with phone call easier. Do not do stairs without assist.    Person(s) Educated Patient   Methods Explanation;Demonstration;Verbal cues   Comprehension Verbalized understanding;Returned demonstration;Verbal cues required;Need further instruction          PT Short Term Goals - 10/22/16 1248      PT SHORT TERM GOAL #1   Title Patient will verbalize understanding and return demonstration for initial HEP to improve LE strength and balance. (TARGET DATE: 10/21/2016)    Time 4   Period Weeks   Status Achieved     PT SHORT TERM GOAL #2   Title Patient will be able to safely maintain upright posture sitting edge of bed with min A for 1 minute to  indicate improvement in trunk control. (TARGET DATE: 10/21/2016)    Time 4   Period Weeks   Status Achieved     PT SHORT TERM GOAL #3   Title Patient will demonstrate ability to transfer from w/c to mat with min A to indicate improvement in functional mobility and a decrease in her risk of falling. (TARGET DATE: 10/21/2016)    Time 4   Period Weeks   Status Achieved     PT SHORT TERM GOAL #4  Title Patient will demonstrate ability to maintain static standing for 30s with BUE support on RW and min assist to indicate a decrease in risk of falling. (TARGET DATE: 10/21/2016)    Time 4   Period Weeks   Status Achieved           PT Long Term Goals - 11/02/16 1825      PT LONG TERM GOAL #1   Title Patient will verbalize understanding and return demonstration for ongoing HEP. (TARGET DATE: 11/25/2016)    Time 10   Period Weeks   Status On-going     PT LONG TERM GOAL #2   Title Patient will demonstrate ability to safely maintain trunk control sitting balance static and reaches 6" modified indepedent to indicate a decrease in her risk of falling. (TARGET DATE: 11/25/2016)    Time 10   Period Weeks   Status On-going     PT LONG TERM GOAL #3   Title Patient will be able to transfer w/c to mat modified independent to indicate a decrease in her risk of falling during transfers. (TARGET DATE: 11/25/2016)    Time 10   Period Weeks   Status On-going     PT LONG TERM GOAL #4   Title Patient will demonstrate ability to climb stairs (1 rail) and min A to indicate a decreae in caregiver burden and a decrease in her risk of falling when climbing the stairs to get to the bedroom and bathroom at home. (TARGET DATE: 11/25/2016)    Time 10   Period Weeks   Status On-going     PT LONG TERM GOAL #5   Title Patient will demonstrate ability to ambulate 67ft with LRAD and min A to indicate improvement in ability to ambulate household distances with assistance. (TARGET DATE: 11/25/2016)    Time 10    Period Weeks   Status On-going     PT LONG TERM GOAL #6   Title Patient will improvem FOTO score by >/= 10% to indicate improvement in self-perceived functional mobility. (TARGET DATE: 11/25/2016)    Time 10   Period Weeks   Status On-going               Plan - 11/14/16 2142    Clinical Impression Statement Patient verbalized improved understanding for return to work plan with sitting posture & progressing time at computer. Patient progressed gait with less assistance required including stairs & ramp. Patient will probably need additional PT after plan of care ends next week. PT plans to recertify at that time.    Rehab Potential Good   Clinical Impairments Affecting Rehab Potential HTN, cervical spondylosis, C6-7 L side disc protrusion causing L foraminal impingement, anemia, functional neurological disorder, anemia     PT Frequency 2x / week   PT Duration Other (comment)   PT Treatment/Interventions ADLs/Self Care Home Management;Electrical Stimulation;Neuromuscular re-education;Balance training;Therapeutic exercise;Therapeutic activities;Functional mobility training;Stair training;Gait training;DME Instruction;Patient/family education;Energy conservation   PT Next Visit Plan progress gait  & standing balance with RW support; work on neuromuscular control including trunk without back support.    Consulted and Agree with Plan of Care Patient      Patient will benefit from skilled therapeutic intervention in order to improve the following deficits and impairments:  Decreased activity tolerance, Decreased balance, Decreased mobility, Decreased knowledge of use of DME, Decreased endurance, Decreased strength, Impaired flexibility, Impaired tone  Visit Diagnosis: Other symptoms and signs involving the nervous system  Unsteadiness on feet  Other abnormalities of  gait and mobility  Muscle weakness (generalized)     Problem List Patient Active Problem List   Diagnosis Date  Noted  . Abnormal laboratory test 09/09/2016  . Gait abnormality 08/03/2016  . Weakness 08/03/2016  . Paresthesia 08/03/2016  . S/P cervical spinal fusion 10/23/2015  . Hypersomnia 12/08/2010  . 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  . EPIGASTRIC PAIN 10/13/2009  . IRRITABLE BOWEL SYNDROME 03/10/2009  . BACK PAIN, LUMBAR 07/01/2008  . COSTOCHONDRITIS 02/01/2008  . VIRAL URI 01/08/2008  . CHEST PAIN 01/08/2008  . 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 ARM PAIN 12/25/2007  . ANEMIA-NOS 10/02/2007  . Essential hypertension 10/02/2007  . GERD 10/02/2007  . POLYP, GALLBLADDER 10/02/2007  . UTERINE POLYP 10/02/2007  . OSTEOARTHRITIS 10/02/2007  . Dyskinesia 10/02/2007  . HEADACHE 10/02/2007  . PALPITATIONS 10/02/2007  . CARDIAC MURMUR, HX OF 10/02/2007  . NEPHROLITHIASIS, HX OF 10/02/2007    Jamey Reas PT, DPT 11/14/2016, 9:46 PM  [facility] [address] [place], [place], [address] Phone: [phone]   Fax:  [phone]  Name: KAMREE WIENS MRN: [id number] Date of Birth: 08-30-1970

## 2016-11-16 ENCOUNTER — Encounter: Payer: Self-pay | Admitting: Physical Therapy

## 2016-11-16 ENCOUNTER — Ambulatory Visit: Payer: 59 | Admitting: Physical Therapy

## 2016-11-16 DIAGNOSIS — M6281 Muscle weakness (generalized): Secondary | ICD-10-CM

## 2016-11-16 DIAGNOSIS — R2689 Other abnormalities of gait and mobility: Secondary | ICD-10-CM

## 2016-11-16 DIAGNOSIS — R29818 Other symptoms and signs involving the nervous system: Secondary | ICD-10-CM

## 2016-11-16 DIAGNOSIS — R2681 Unsteadiness on feet: Secondary | ICD-10-CM

## 2016-11-18 NOTE — Therapy (Signed)
[facility] [address] [place], [place], [address] Phone: [phone]   Fax:  [phone]  Physical Therapy Treatment  Patient Details  Name: Amy Barry MRN: [id number] Date of Birth: 10/08/1970 Referring Provider: Donald Prose MD   Encounter Date: 11/16/2016     11/16/16 1629  PT Visits / Re-Eval  Visit Number 17  Number of Visits 20  Date for PT Re-Evaluation 11/25/16  Authorization  Authorization Type [place] - Visit Number 17  Authorization - Number of Visits 60  PT Time Calculation  PT Start Time 3716  PT Stop Time 1620  PT Time Calculation (min) 47 min  PT - End of Session  Equipment Utilized During Treatment Gait belt  Activity Tolerance Patient tolerated treatment well;Patient limited by fatigue  Behavior During Therapy [facility] for tasks assessed/performed    Past Medical History:  Diagnosis Date  . Anemia   . Dysrhythmia    pvc's holter monitor started lopressor- no return visit  . GERD (gastroesophageal reflux disease)   . Headache(784.0)   . Heart murmur    child no echo  . History of kidney stones   . Hypertension   . IBS (irritable bowel syndrome)   . Low vitamin D level   . Lower extremity weakness   . Neuromuscular disorder ([place])   . Numbness and tingling   . Osteoarthritis   . Pneumonia    January- tx with IV antibiotics  for pneumonia in 04/2015   . PVC (premature ventricular contraction)   . Seizure disorder ([place])    denied seizures- has dyskinesia  . Sleep concern 2013   toldf that she doesn 't have apnea but she stays sleeping a lot  . Upper extremity weakness     Past Surgical History:  Procedure Laterality Date  . 2 ovarian cysts     age 23  . CERVICAL DISC ARTHROPLASTY N/A 10/23/2015   Procedure: Cervical Disc Arthroplasty Cervical six-seven;  Surgeon: Eustace Moore, MD;  Location: [facility];  Service: Neurosurgery;  Laterality: N/A;  .  ESOPHAGOGASTRODUODENOSCOPY     for GERD  . LAPAROSCOPIC ABDOMINAL EXPLORATION     dx ibs  . TUBAL LIGATION      There were no vitals filed for this visit.     11/16/16 1535  Symptoms/Limitations  Subjective She sat in office doing computer & paper work but lost track of time so went 2 hrs. She remembered PT said 1hr initially.   Patient is accompained by: Family member  Limitations Sitting;Lifting;Standing;Walking;House hold activities  Patient Stated Goals sitting upright, walking, driving, cooking, cleaning, return to work, walk the dog   Pain Assessment  Currently in Pain? Yes  Pain Score 6  Pain Location Generalized  Pain Orientation Right;Left (right >left)  Pain Descriptors / Indicators Aching;Burning  Pain Type Chronic pain  Pain Onset More than a month ago  Pain Frequency Constant  Aggravating Factors  shoulders are hurting more today due to sitting too long yesterday  Pain Relieving Factors medications, hot shower      11/16/16 1535  Transfers  Transfers Sit to Stand;Stand to Sit  Sit to Stand 5: Supervision;With upper extremity assist;With armrests;From chair/3-in-1 (to RW for stabilization)  Stand to Sit 5: Supervision;With upper extremity assist;With armrests;To chair/3-in-1 (from [place] for stabilization)  Ambulation/Gait  Ambulation/Gait Yes  Ambulation/Gait Assistance 4: Min assist  Ambulation/Gait Assistance Details verbal cues on posture with upright trunk looking forward, visualizing step placement  to minimize ataxia.   Ambulation Distance (Feet) 150 Feet  Assistive device Rolling [place] (No ankle air cast with no ankle supination)  Gait Pattern Step-through pattern;Decreased stride length;Right hip hike;Left hip hike;Right steppage;Left steppage;Ataxic;Trunk flexed;Wide base of support  Ambulation Surface Indoor;Level  Stairs Yes  Stairs Assistance 3: Mod assist  Stairs Assistance Details (indicate cue type and reason) verbal cues to alt. lead limb and  wt shift.  Stair Management Technique Forwards;Two rails;Step to pattern (alt lead LE)  Number of Stairs 4  Ramp 4: Min assist (1 person only today)  Ramp Details (indicate cue type and reason) verbal & tactile / manual cues on RW control, posture, step length & wt shift  Curb 3: Mod assist (only 1 person today)  Curb Details (indicate cue type and reason) verbal & manual cues on RW movement, foot position, sequence & wt shift  Neuro Re-ed   Neuro Re-ed Details  Sitting on 55cm therapeutic ball with chairs on each side and PT manually controlling ball from behind, mirror in front for visual input to upright posture:  wt shifts laterally, ant/post and circular,  alt. ankle DF, alt. ankle PF, stepping forward with BUE support and reaching forward with single UE support on chair.                             11/16/16 1630  Plan  Clinical Impression Statement Patient required less assist for gait. She and her daughters appear to understand recommendation to initiate community gait with RW with w/c brought along for rest as needed. Patient appears will need additional PT after plan of care ends next week to maximize function. Patient is improving and PT anticipates further progress with addtional care.   Pt will benefit from skilled therapeutic intervention in order to improve on the following deficits Decreased activity tolerance;Decreased balance;Decreased mobility;Decreased knowledge of use of DME;Decreased endurance;Decreased strength;Impaired flexibility;Impaired tone  Rehab Potential Good  Clinical Impairments Affecting Rehab Potential HTN, cervical spondylosis, C6-7 L side disc protrusion causing L foraminal impingement, anemia, functional neurological disorder, anemia    PT Frequency 2x / week  PT Duration Other (comment)  PT Treatment/Interventions ADLs/Self Care Home Management;Electrical Stimulation;Neuromuscular re-education;Balance training;Therapeutic  exercise;Therapeutic activities;Functional mobility training;Stair training;Gait training;DME Instruction;Patient/family education;Energy conservation  PT Next Visit Plan assess LTGs, update goals as appropriate and recertify;   Consulted and Agree with Plan of Care Patient        PT Short Term Goals - 10/22/16 1248      PT SHORT TERM GOAL #1   Title Patient will verbalize understanding and return demonstration for initial HEP to improve LE strength and balance. (TARGET DATE: 10/21/2016)    Time 4   Period Weeks   Status Achieved     PT SHORT TERM GOAL #2   Title Patient will be able to safely maintain upright posture sitting edge of bed with min A for 1 minute to indicate improvement in trunk control. (TARGET DATE: 10/21/2016)    Time 4   Period Weeks   Status Achieved     PT SHORT TERM GOAL #3   Title Patient will demonstrate ability to transfer from w/c to mat with min A to indicate improvement in functional mobility and a decrease in her risk of falling. (TARGET DATE: 10/21/2016)    Time 4   Period Weeks   Status Achieved     PT SHORT TERM GOAL #4   Title Patient will demonstrate  ability to maintain static standing for 30s with BUE support on RW and min assist to indicate a decrease in risk of falling. (TARGET DATE: 10/21/2016)    Time 4   Period Weeks   Status Achieved           PT Long Term Goals - 11/02/16 1825      PT LONG TERM GOAL #1   Title Patient will verbalize understanding and return demonstration for ongoing HEP. (TARGET DATE: 11/25/2016)    Time 10   Period Weeks   Status On-going     PT LONG TERM GOAL #2   Title Patient will demonstrate ability to safely maintain trunk control sitting balance static and reaches 6" modified indepedent to indicate a decrease in her risk of falling. (TARGET DATE: 11/25/2016)    Time 10   Period Weeks   Status On-going     PT LONG TERM GOAL #3   Title Patient will be able to transfer w/c to mat modified independent to  indicate a decrease in her risk of falling during transfers. (TARGET DATE: 11/25/2016)    Time 10   Period Weeks   Status On-going     PT LONG TERM GOAL #4   Title Patient will demonstrate ability to climb stairs (1 rail) and min A to indicate a decreae in caregiver burden and a decrease in her risk of falling when climbing the stairs to get to the bedroom and bathroom at home. (TARGET DATE: 11/25/2016)    Time 10   Period Weeks   Status On-going     PT LONG TERM GOAL #5   Title Patient will demonstrate ability to ambulate 75ft with LRAD and min A to indicate improvement in ability to ambulate household distances with assistance. (TARGET DATE: 11/25/2016)    Time 10   Period Weeks   Status On-going     PT LONG TERM GOAL #6   Title Patient will improvem FOTO score by >/= 10% to indicate improvement in self-perceived functional mobility. (TARGET DATE: 11/25/2016)    Time 10   Period Weeks   Status On-going             Patient will benefit from skilled therapeutic intervention in order to improve the following deficits and impairments:     Visit Diagnosis: Other symptoms and signs involving the nervous system  Unsteadiness on feet  Other abnormalities of gait and mobility  Muscle weakness (generalized)     Problem List Patient Active Problem List   Diagnosis Date Noted  . Abnormal laboratory test 09/09/2016  . Gait abnormality 08/03/2016  . Weakness 08/03/2016  . Paresthesia 08/03/2016  . S/P cervical spinal fusion 10/23/2015  . Hypersomnia 12/08/2010  . 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  . EPIGASTRIC PAIN 10/13/2009  . IRRITABLE BOWEL SYNDROME 03/10/2009  . BACK PAIN, LUMBAR 07/01/2008  . COSTOCHONDRITIS 02/01/2008  . VIRAL URI 01/08/2008  . CHEST PAIN 01/08/2008  . 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 ARM PAIN 12/25/2007  . ANEMIA-NOS 10/02/2007  . Essential hypertension 10/02/2007  . GERD 10/02/2007  . POLYP, GALLBLADDER 10/02/2007  .  UTERINE POLYP 10/02/2007  . OSTEOARTHRITIS 10/02/2007  . Dyskinesia 10/02/2007  . HEADACHE 10/02/2007  . PALPITATIONS 10/02/2007  . CARDIAC MURMUR, HX OF 10/02/2007  . NEPHROLITHIASIS, HX OF 10/02/2007    Jamey Reas PT, DPT 11/18/2016, 4:28 PM  [facility] [address] [place], [place], [address] Phone: [phone]   Fax:  [phone]  Name: Amy Barry MRN: [id number] Date of Birth: Aug 05, 1970

## 2016-11-21 ENCOUNTER — Encounter: Payer: Self-pay | Admitting: Physical Therapy

## 2016-11-21 ENCOUNTER — Ambulatory Visit: Payer: 59 | Admitting: Physical Therapy

## 2016-11-21 DIAGNOSIS — R29818 Other symptoms and signs involving the nervous system: Secondary | ICD-10-CM | POA: Diagnosis not present

## 2016-11-21 DIAGNOSIS — M6281 Muscle weakness (generalized): Secondary | ICD-10-CM

## 2016-11-21 DIAGNOSIS — R2681 Unsteadiness on feet: Secondary | ICD-10-CM

## 2016-11-21 DIAGNOSIS — R2689 Other abnormalities of gait and mobility: Secondary | ICD-10-CM

## 2016-11-21 NOTE — Therapy (Signed)
[facility] [address] [place], [place], [address] Phone: [phone]   Fax:  [phone]  Physical Therapy Treatment  Patient Details  Name: Amy Barry MRN: [id number] Date of Birth: [birth year] Referring Provider: Donald Prose MD   Encounter Date: 11/21/2016      PT End of Session - 11/21/16 1629    Visit Number 18   Number of Visits 20   Date for PT Re-Evaluation 11/25/16   Authorization Type [employer]    Authorization - Visit Number 18   Authorization - Number of Visits 60   PT Start Time 7517   PT Stop Time 1615   PT Time Calculation (min) 51 min   Activity Tolerance Patient tolerated treatment well;Patient limited by fatigue   Behavior During Therapy [facility] for tasks assessed/performed      Past Medical History:  Diagnosis Date  . Anemia   . Dysrhythmia    pvc's holter monitor started lopressor- no return visit  . GERD (gastroesophageal reflux disease)   . Headache(784.0)   . Heart murmur    child no echo  . History of kidney stones   . Hypertension   . IBS (irritable bowel syndrome)   . Low vitamin D level   . Lower extremity weakness   . Neuromuscular disorder ([place])   . Numbness and tingling   . Osteoarthritis   . Pneumonia    January- tx with IV antibiotics  for pneumonia in 04/2015   . PVC (premature ventricular contraction)   . Seizure disorder ([place])    denied seizures- has dyskinesia  . Sleep concern 2013   toldf that she doesn 't have apnea but she stays sleeping a lot  . Upper extremity weakness     Past Surgical History:  Procedure Laterality Date  . 2 ovarian cysts     age 47  . CERVICAL DISC ARTHROPLASTY N/A 10/23/2015   Procedure: Cervical Disc Arthroplasty Cervical six-seven;  Surgeon: Eustace Moore, MD;  Location: [facility];  Service: Neurosurgery;  Laterality: N/A;  . ESOPHAGOGASTRODUODENOSCOPY     for GERD  . LAPAROSCOPIC ABDOMINAL EXPLORATION     dx ibs  .  TUBAL LIGATION      There were no vitals filed for this visit.      Subjective Assessment - 11/21/16 1537    Subjective Still awaiting call to schedule spinal tap.  Is still doing work in her office x 1 hour/day but they are going to re-arrange her office to make the desk more accessible.  Was able to ambulate in [employer] with shopping cart with husband's assistance.  Still having general aching and fatigue; not sleeping well at night-pain keeps her awake at night.   Patient is accompained by: Family member   Limitations Sitting;Lifting;Standing;Walking;House hold activities   How long can you sit comfortably? 2 hrs prior to onset of pain    Patient Stated Goals sitting upright, walking, driving, cooking, cleaning, return to work, walk the dog                          [facility] Adult PT Treatment/Exercise - 11/21/16 1544      Transfers   Sit to Stand 5: Supervision   Sit to Stand Details (indicate cue type and reason) with UE support on arm rests or RW   Stand Pivot Transfers 4: Min guard   Stand Pivot Transfer Details (indicate cue type and reason) supervision  with support on RW, one "dip" with therapist needing to provide min A for support   Squat Pivot Transfers 6: Modified independent (Device/Increase time)   Squat Pivot Transfer Details (indicate cue type and reason) with UE support of arm rests     Ambulation/Gait   Ambulation/Gait Yes   Ambulation/Gait Assistance 4: Min assist   Ambulation Distance (Feet) 150 Feet   Assistive device Rolling [place];4-wheeled [place]   Gait Pattern Step-through pattern;Right steppage;Left steppage;Right flexed knee in stance;Left flexed knee in stance;Ataxic;Lateral hip instability;Lateral trunk lean to right;Lateral trunk lean to left;Trunk flexed   Ambulation Surface Level;Indoor   Stairs Yes   Stairs Assistance 3: Mod assist   Stairs Assistance Details (indicate cue type and reason) initiated stair negotiation training with  alternating sequence but pt demonstrating increased pulling with UE and continued to require assistance to advance COG forwards; cued pt to bring COG forwards over stance LE before advancing contralateral LE causing pt to return to step to sequence due to increased strain on LE   Stair Management Technique Two rails;Alternating pattern;Step to pattern;Forwards   Number of Stairs 12   Height of Stairs 6     Balance   Balance Assessed Yes     Dynamic Sitting Balance   Reach (Patient is able to reach ___ inches to right, left, forward, back) 10-11 inches laterally to L and R, 11 inches forwards, 6 inches down to floor MOD I                PT Education - 11/21/16 1628    Education provided Yes   Education Details ways to down regulate and calm nervous system to prepare for bedtime (warm bath, low lights, avoiding electronics, etc) to decrease pain and improve rest   Person(s) Educated Patient   Methods Explanation   Comprehension Verbalized understanding          PT Short Term Goals - 10/22/16 1248      PT SHORT TERM GOAL #1   Title Patient will verbalize understanding and return demonstration for initial HEP to improve LE strength and balance. (TARGET DATE: 10/21/2016)    Time 4   Period Weeks   Status Achieved     PT SHORT TERM GOAL #2   Title Patient will be able to safely maintain upright posture sitting edge of bed with min A for 1 minute to indicate improvement in trunk control. (TARGET DATE: 10/21/2016)    Time 4   Period Weeks   Status Achieved     PT SHORT TERM GOAL #3   Title Patient will demonstrate ability to transfer from w/c to mat with min A to indicate improvement in functional mobility and a decrease in her risk of falling. (TARGET DATE: 10/21/2016)    Time 4   Period Weeks   Status Achieved     PT SHORT TERM GOAL #4   Title Patient will demonstrate ability to maintain static standing for 30s with BUE support on RW and min assist to indicate a decrease  in risk of falling. (TARGET DATE: 10/21/2016)    Time 4   Period Weeks   Status Achieved           PT Long Term Goals - 11/21/16 1634      PT LONG TERM GOAL #1   Title Patient will verbalize understanding and return demonstration for ongoing HEP. (TARGET DATE: 11/25/2016)    Status On-going     PT LONG TERM GOAL #2   Title  Patient will demonstrate ability to safely maintain trunk control sitting balance static and reaches 6" modified indepedent to indicate a decrease in her risk of falling. (TARGET DATE: 11/25/2016)    Baseline 10-11 inches laterally, forwards, and 6 in down to floor   Status Achieved     PT LONG TERM GOAL #3   Title Patient will be able to transfer w/c to mat modified independent to indicate a decrease in her risk of falling during transfers. (TARGET DATE: 11/25/2016)    Baseline MOD I squat pivot, supervision-min guard stand pivot with RW   Status Achieved     PT LONG TERM GOAL #4   Title Patient will demonstrate ability to climb stairs (1 rail) and min A to indicate a decrease in caregiver burden and a decrease in her risk of falling when climbing the stairs to get to the bedroom and bathroom at home. (TARGET DATE: 11/25/2016)    Baseline mod A   Status Partially Met     PT LONG TERM GOAL #5   Title Patient will demonstrate ability to ambulate 64f with LRAD and min A to indicate improvement in ability to ambulate household distances with assistance. (TARGET DATE: 11/25/2016)    Baseline 115' min A   Status Achieved     PT LONG TERM GOAL #6   Title Patient will improvem FOTO score by >/= 10% to indicate improvement in self-perceived functional mobility. (TARGET DATE: 11/25/2016)    Status On-going               Plan - 11/21/16 1630    Clinical Impression Statement Initiated assessment of pt progress with re-assessment of LTG including limits of stability when reaching out of BOS from sitting, transfers mat <> chair with and without AD, gait and stair  negotiation.  Pt is demonstrating improved trunk and postural control reaching out of BOS with supervision and one UE support, can transfer squat pivot MOD I and is almost able to transfer stand pivot with RW with supervision only, is ambulating with min A but continues to require mod A for stair negotiation.  Will continue to assess LTG at next visit with assessment of current HEP and adjusting any exercises needed to continue progress.  Today pt was able to leave w/c in waiting area and ambulate to/from treatment area with RW and min A.  Will progress to leaving RW in car and will begin to practice use of rolling AD outside over pavement.     Rehab Potential Good   Clinical Impairments Affecting Rehab Potential HTN, cervical spondylosis, C6-7 L side disc protrusion causing L foraminal impingement, anemia, functional neurological disorder, anemia     PT Frequency 2x / week   PT Duration Other (comment)   PT Treatment/Interventions ADLs/Self Care Home Management;Electrical Stimulation;Neuromuscular re-education;Balance training;Therapeutic exercise;Therapeutic activities;Functional mobility training;Stair training;Gait training;DME Instruction;Patient/family education;Energy conservation   PT Next Visit Plan finish assessing LTG-HEP and recertify for 2x/week x 8 weeks.  need to upgrade STG and LTG-include gait goal outside over pavement with LRAD   Consulted and Agree with Plan of Care Patient   Family Member Consulted 2 dtrs      Patient will benefit from skilled therapeutic intervention in order to improve the following deficits and impairments:  Decreased activity tolerance, Decreased balance, Decreased mobility, Decreased knowledge of use of DME, Decreased endurance, Decreased strength, Impaired flexibility, Impaired tone  Visit Diagnosis: Other symptoms and signs involving the nervous system  Unsteadiness on feet  Other abnormalities of gait  and mobility  Muscle weakness  (generalized)     Problem List Patient Active Problem List   Diagnosis Date Noted  . Abnormal laboratory test 09/09/2016  . Gait abnormality 08/03/2016  . Weakness 08/03/2016  . Paresthesia 08/03/2016  . S/P cervical spinal fusion 10/23/2015  . Hypersomnia 12/08/2010  . 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  . EPIGASTRIC PAIN 10/13/2009  . IRRITABLE BOWEL SYNDROME 03/10/2009  . BACK PAIN, LUMBAR 07/01/2008  . COSTOCHONDRITIS 02/01/2008  . VIRAL URI 01/08/2008  . CHEST PAIN 01/08/2008  . 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 ARM PAIN 12/25/2007  . ANEMIA-NOS 10/02/2007  . Essential hypertension 10/02/2007  . GERD 10/02/2007  . POLYP, GALLBLADDER 10/02/2007  . UTERINE POLYP 10/02/2007  . OSTEOARTHRITIS 10/02/2007  . Dyskinesia 10/02/2007  . HEADACHE 10/02/2007  . PALPITATIONS 10/02/2007  . CARDIAC MURMUR, HX OF 10/02/2007  . NEPHROLITHIASIS, HX OF 10/02/2007   Raylene Everts, PT, DPT 11/21/16    4:37 PM    [facility] [address] [place], [place], [address] Phone: [phone]   Fax:  [phone]  Name: LYNNSIE LINDERS MRN: [id number] Date of Birth: July 17, 1970

## 2016-11-23 ENCOUNTER — Encounter: Payer: Self-pay | Admitting: Physical Therapy

## 2016-11-23 ENCOUNTER — Ambulatory Visit: Payer: 59 | Admitting: Physical Therapy

## 2016-11-23 DIAGNOSIS — R2681 Unsteadiness on feet: Secondary | ICD-10-CM

## 2016-11-23 DIAGNOSIS — R260 Ataxic gait: Secondary | ICD-10-CM

## 2016-11-23 DIAGNOSIS — M6281 Muscle weakness (generalized): Secondary | ICD-10-CM

## 2016-11-23 DIAGNOSIS — R29818 Other symptoms and signs involving the nervous system: Secondary | ICD-10-CM | POA: Diagnosis not present

## 2016-11-23 DIAGNOSIS — Z9181 History of falling: Secondary | ICD-10-CM

## 2016-11-23 DIAGNOSIS — R279 Unspecified lack of coordination: Secondary | ICD-10-CM

## 2016-11-23 DIAGNOSIS — R2689 Other abnormalities of gait and mobility: Secondary | ICD-10-CM

## 2016-11-24 DIAGNOSIS — R252 Cramp and spasm: Secondary | ICD-10-CM | POA: Diagnosis not present

## 2016-11-24 DIAGNOSIS — R269 Unspecified abnormalities of gait and mobility: Secondary | ICD-10-CM | POA: Diagnosis not present

## 2016-11-24 NOTE — Therapy (Signed)
[facility] [address] [place], [place], [address] Phone: [phone]   Fax:  [phone]  Physical Therapy Treatment  Patient Details  Name: Amy Barry MRN: [id number] Date of Birth: [birth year] Referring Provider: Donald Prose MD   Encounter Date: 11/23/2016      PT End of Session - 11/23/16 2053    Visit Number 19   Number of Visits 35   Date for PT Re-Evaluation 01/20/17   Authorization Type [employer]    Authorization - Visit Number 19   Authorization - Number of Visits 60   PT Start Time 6962   PT Stop Time 1616   PT Time Calculation (min) 46 min   Equipment Utilized During Treatment Gait belt   Activity Tolerance Patient tolerated treatment well;Patient limited by fatigue   Behavior During Therapy WFL for tasks assessed/performed      Past Medical History:  Diagnosis Date  . Anemia   . Dysrhythmia    pvc's holter monitor started lopressor- no return visit  . GERD (gastroesophageal reflux disease)   . Headache(784.0)   . Heart murmur    child no echo  . History of kidney stones   . Hypertension   . IBS (irritable bowel syndrome)   . Low vitamin D level   . Lower extremity weakness   . Neuromuscular disorder ([place])   . Numbness and tingling   . Osteoarthritis   . Pneumonia    January- tx with IV antibiotics  for pneumonia in 04/2015   . PVC (premature ventricular contraction)   . Seizure disorder ([place])    denied seizures- has dyskinesia  . Sleep concern 2013   toldf that she doesn 't have apnea but she stays sleeping a lot  . Upper extremity weakness     Past Surgical History:  Procedure Laterality Date  . 2 ovarian cysts     age 46  . CERVICAL DISC ARTHROPLASTY N/A 10/23/2015   Procedure: Cervical Disc Arthroplasty Cervical six-seven;  Surgeon: Eustace Moore, MD;  Location: [facility];  Service: Neurosurgery;  Laterality: N/A;  . ESOPHAGOGASTRODUODENOSCOPY     for GERD  .  LAPAROSCOPIC ABDOMINAL EXPLORATION     dx ibs  . TUBAL LIGATION      There were no vitals filed for this visit.      Subjective Assessment - 11/23/16 1539    Subjective She has spinal tap tomorrow. She walked at [place] with daughter's assist.    Patient is accompained by: Family member   Limitations Sitting;Lifting;Standing;Walking;House hold activities   Patient Stated Goals sitting upright, walking, driving, cooking, cleaning, return to work, walk the dog    Currently in Pain? Yes   Pain Score 6    Pain Location Generalized  more shoulders & arms today   Pain Orientation Right;Left   Pain Descriptors / Indicators Aching;Burning   Pain Type Chronic pain   Pain Onset More than a month ago   Pain Frequency Constant   Aggravating Factors  sitting on rollator Shurley vacuuming.    Pain Relieving Factors medications, hot shower                         [facility] Adult PT Treatment/Exercise - 11/23/16 1530      Transfers   Transfers Sit to Stand;Stand to Sit   Sit to Stand 4: Min assist;4: Min guard;5: Supervision;With upper extremity assist;From chair/3-in-1  from simulated couch  with single armrest to rollator   Sit to Stand Details Visual cues/gestures for sequencing;Verbal cues for technique   Sit to Stand Details (indicate cue type and reason) verbal, demo & tactile cues on technique including wt shift. 3 reps each from chair with armrests, low couch with left armrest and couch with right armrest.    Stand to Sit 4: Min guard   Stand to Sit Details (indicate cue type and reason) Verbal cues for technique;Visual cues/gestures for sequencing;Verbal cues for safe use of DME/AE   Stand to Sit Details verbal, demo & tactile cues on technique including wt shift. 3 reps each from chair with armrests, low couch with left armrest and couch with right armrest.      Ambulation/Gait   Ambulation/Gait Yes   Ambulation/Gait Assistance 4: Min assist    Ambulation/Gait Assistance Details Manual, verbal & visual cues on rollator safety, management, step forward with heel contact over hip flexion and manual assist for hip stability.    Ambulation Distance (Feet) 500 Feet   Assistive device Rollator   Gait Pattern Step-through pattern;Decreased stride length;Right hip hike;Left hip hike;Right steppage;Left steppage;Ataxic;Trunk flexed;Lateral hip instability;Poor foot clearance - left;Poor foot clearance - right   Ambulation Surface Indoor;Level;Outdoor;Paved                  PT Short Term Goals - 11/23/16 2054      PT SHORT TERM GOAL #1   Title Patient verbalizes and demonstrates understanding of updated HEP.    Time 4   Period Weeks   Status New   Target Date 12/23/16     PT SHORT TERM GOAL #2   Title Patient reaches 7" anteriorly and to knee level standing balance with single UE support on AD or counter with supervision.    Time 4   Period Weeks   Status New   Target Date 12/23/16     PT SHORT TERM GOAL #3   Title Patient sit to/from stand from chair without armrests using UEs to [place] with supervision.    Time 4   Period Weeks   Status New   Target Date 12/23/16     PT SHORT TERM GOAL #4   Title Patient ambulates 500' with rollator Stiff with supervision.    Time 4   Period Weeks   Status New   Target Date 12/23/16     PT SHORT TERM GOAL #5   Title Patient negotiates ramps & curbs with rollator [place] with minA.    Time 4   Period Weeks   Status New   Target Date 12/23/16           PT Long Term Goals - 11/23/16 2058      PT LONG TERM GOAL #1   Title Patient will verbalize understanding and return demonstration for ongoing HEP.    Baseline 11/23/2016 progressing with current HEP but needs skilled progression as mobility, balance & strength progress.    Time 8   Period Weeks   Status On-going   Target Date 01/20/17     PT LONG TERM GOAL #2   Title Patient will demonstrate ability to safely  maintain trunk control sitting balance static and reaches 6" modified indepedent to indicate a decrease in her risk of falling. (TARGET DATE: 11/25/2016)    Baseline 10-11 inches laterally, forwards, and 6 in down to floor   Status Achieved     PT LONG TERM GOAL #3   Title Patient will be able to  transfer w/c to mat modified independent to indicate a decrease in her risk of falling during transfers. (TARGET DATE: 11/25/2016)    Baseline MOD I squat pivot, supervision-min guard stand pivot with RW   Status Achieved     PT LONG TERM GOAL #4   Title Patient will demonstrate ability to climb stairs (1 rail) and min A to indicate a decrease in caregiver burden and a decrease in her risk of falling when climbing the stairs to get to the bedroom and bathroom at home. (TARGET DATE: 11/25/2016)    Baseline mod A   Status Partially Met     PT LONG TERM GOAL #5   Title Patient will demonstrate ability to ambulate 68f with LRAD and min A to indicate improvement in ability to ambulate household distances with assistance. (TARGET DATE: 11/25/2016)    Baseline 115' min A   Status Achieved     PT LONG TERM GOAL #6   Title Patient will improvem FOTO score by >/= 10% to indicate improvement in self-perceived functional mobility. (TARGET DATE: 11/25/2016)    Baseline Pt will retake at discharge.   Time 8   Period Weeks   Status On-going   Target Date 01/20/17     PT LONG TERM GOAL #7   Title Patient ambulates 500' with LRAD modified independent.    Time 8   Period Weeks   Status New   Target Date 01/20/17     PT LONG TERM GOAL #8   Title Patient negotiates ramps, curbs & stairs with LRAD modified independent.    Time 8   Period Weeks   Status New   Target Date 01/20/17     PT LONG TERM GOAL  #9   TITLE Berg Balance >36/56    Time 8   Period Weeks   Status New   Target Date 01/20/17               Plan - 11/23/16 2103    Clinical Impression Statement Patient is progressing with  mobility with less assistance and increased distance with gait. Patient improved sit to /from stand from simulated couch with skilled instructions. Patient has improved her mobility and balance but appears to need & would benefit from additional PT to progress her mobility to modified independent level.    Rehab Potential Good   Clinical Impairments Affecting Rehab Potential HTN, cervical spondylosis, C6-7 L side disc protrusion causing L foraminal impingement, anemia, functional neurological disorder, anemia     PT Frequency 2x / week   PT Duration Other (comment)   PT Treatment/Interventions ADLs/Self Care Home Management;Electrical Stimulation;Neuromuscular re-education;Balance training;Therapeutic exercise;Therapeutic activities;Functional mobility training;Stair training;Gait training;DME Instruction;Patient/family education;Energy conservation   PT Next Visit Plan work towards updated STGs including gait outdoors with rollator Wahlstrom.    Consulted and Agree with Plan of Care Patient;Family member/caregiver   Family Member Consulted 2 dtrs      Patient will benefit from skilled therapeutic intervention in order to improve the following deficits and impairments:  Decreased activity tolerance, Decreased balance, Decreased mobility, Decreased knowledge of use of DME, Decreased endurance, Decreased strength, Impaired flexibility, Impaired tone  Visit Diagnosis: Other symptoms and signs involving the nervous system  Unsteadiness on feet  Other abnormalities of gait and mobility  Muscle weakness (generalized)  History of falling  Ataxic gait  Unspecified lack of coordination     Problem List Patient Active Problem List   Diagnosis Date Noted  . Abnormal laboratory test 09/09/2016  . Gait  abnormality 08/03/2016  . Weakness 08/03/2016  . Paresthesia 08/03/2016  . S/P cervical spinal fusion 10/23/2015  . Hypersomnia 12/08/2010  . 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  . EPIGASTRIC PAIN  10/13/2009  . IRRITABLE BOWEL SYNDROME 03/10/2009  . BACK PAIN, LUMBAR 07/01/2008  . COSTOCHONDRITIS 02/01/2008  . VIRAL URI 01/08/2008  . CHEST PAIN 01/08/2008  . 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 ARM PAIN 12/25/2007  . ANEMIA-NOS 10/02/2007  . Essential hypertension 10/02/2007  . GERD 10/02/2007  . POLYP, GALLBLADDER 10/02/2007  . UTERINE POLYP 10/02/2007  . OSTEOARTHRITIS 10/02/2007  . Dyskinesia 10/02/2007  . HEADACHE 10/02/2007  . PALPITATIONS 10/02/2007  . CARDIAC MURMUR, HX OF 10/02/2007  . NEPHROLITHIASIS, HX OF 10/02/2007    Jamey Reas PT, DPT 11/24/2016, 9:07 PM  [facility] [address] [place], [place], [address] Phone: [phone]   Fax:  [phone]  Name: Amy Barry MRN: [id number] Date of Birth: 1970-11-07

## 2016-11-28 ENCOUNTER — Encounter: Payer: Self-pay | Admitting: Physical Therapy

## 2016-11-28 ENCOUNTER — Ambulatory Visit: Payer: 59 | Admitting: Physical Therapy

## 2016-11-28 VITALS — BP 143/86

## 2016-11-28 DIAGNOSIS — R29818 Other symptoms and signs involving the nervous system: Secondary | ICD-10-CM

## 2016-11-28 DIAGNOSIS — R2689 Other abnormalities of gait and mobility: Secondary | ICD-10-CM

## 2016-11-28 DIAGNOSIS — R2681 Unsteadiness on feet: Secondary | ICD-10-CM

## 2016-11-28 DIAGNOSIS — M6281 Muscle weakness (generalized): Secondary | ICD-10-CM

## 2016-11-28 NOTE — Therapy (Signed)
[facility] [address] [place], [place], [address] Phone: [phone]   Fax:  [phone]  Physical Therapy Treatment  Patient Details  Name: Amy Barry MRN: [id number] Date of Birth: 11/15/1970 Referring Provider: Donald Prose MD   Encounter Date: 11/28/2016      PT End of Session - 11/28/16 2321    Visit Number 20   Number of Visits 35   Date for PT Re-Evaluation 01/20/17   Authorization Type [employer]    Authorization - Visit Number 20   Authorization - Number of Visits 60   PT Start Time 6153   PT Stop Time 1615   PT Time Calculation (min) 42 min   Equipment Utilized During Treatment Gait belt   Activity Tolerance Patient tolerated treatment well;Patient limited by fatigue   Behavior During Therapy WFL for tasks assessed/performed      Past Medical History:  Diagnosis Date  . Anemia   . Dysrhythmia    pvc's holter monitor started lopressor- no return visit  . GERD (gastroesophageal reflux disease)   . Headache(784.0)   . Heart murmur    child no echo  . History of kidney stones   . Hypertension   . IBS (irritable bowel syndrome)   . Low vitamin D level   . Lower extremity weakness   . Neuromuscular disorder ([place])   . Numbness and tingling   . Osteoarthritis   . Pneumonia    January- tx with IV antibiotics  for pneumonia in 04/2015   . PVC (premature ventricular contraction)   . Seizure disorder ([place])    denied seizures- has dyskinesia  . Sleep concern 2013   toldf that she doesn 't have apnea but she stays sleeping a lot  . Upper extremity weakness     Past Surgical History:  Procedure Laterality Date  . 2 ovarian cysts     age 66  . CERVICAL DISC ARTHROPLASTY N/A 10/23/2015   Procedure: Cervical Disc Arthroplasty Cervical six-seven;  Surgeon: Eustace Moore, MD;  Location: [facility];  Service: Neurosurgery;  Laterality: N/A;  . ESOPHAGOGASTRODUODENOSCOPY     for GERD  .  LAPAROSCOPIC ABDOMINAL EXPLORATION     dx ibs  . TUBAL LIGATION      Vitals:   11/28/16 1543  BP: (!) 143/86        Subjective Assessment - 11/28/16 1543    Subjective Had spinal tap on Thursday and has had headache since. It is getting better.    Patient is accompained by: Family member   Limitations Sitting;Lifting;Standing;Walking;House hold activities   Patient Stated Goals sitting upright, walking, driving, cooking, cleaning, return to work, walk the dog    Currently in Pain? Yes   Pain Score 5    Pain Location Generalized   Pain Orientation Right;Left   Pain Descriptors / Indicators Aching;Burning   Pain Type Chronic pain   Pain Onset More than a month ago   Pain Frequency Constant   Pain Relieving Factors being overshadowed by headache   Multiple Pain Sites Yes   Pain Score 10   Pain Location Head   Pain Orientation Anterior;Posterior;Other (Comment)  behind eyes, frontal lobe & occipital lobe   Pain Descriptors / Indicators Stabbing;Sharp;Shooting   Pain Type Acute pain   Pain Onset More than a month ago   Pain Frequency Constant   Aggravating Factors  sitting & walking   Pain Relieving Factors standing up & laying down  Gait Training with her rollator [place]: PT met patient outside while exiting her mother-n-law's car. Pt ambulated 71' to scheduling desk with modA & manual cues for pelvic control & wt shift. PT changed handle height to fit patient correctly.  Pt ambulated 50' to PT gym with modA & manual cues as noted.   Pt exited PT session ambulating 300' to parked vehicle with rollator [place]itzenrater with minA 90% of time & modA for LE buckling motion. PT provided pelvic control feedback and verbal cues on terminal stance/ initial contact without marching.   Neuromuscular Re-education: Sitting - using noodle along spine & manual cues on upright posture with mirror for visual input. Compression relieved headache for 60seconds then increased. Traction no changes.   Supine with noodle along spine with tactile cues on stability: alternating shoulder flexion 5 reps, alternating hip flexion 8 reps, bil. UE shoulder flexion with elongation, knee to chest single & double stretch 15 sec 3 reps each, hamstring /heelcord neural tension stretch and trunk rotation stretch.                              PT Short Term Goals - 11/28/16 2322      PT SHORT TERM GOAL #1   Title Patient verbalizes and demonstrates understanding of updated HEP.    Time 4   Period Weeks   Status On-going     PT SHORT TERM GOAL #2   Title Patient reaches 7" anteriorly and to knee level standing balance with single UE support on AD or counter with supervision.    Time 4   Period Weeks   Status On-going     PT SHORT TERM GOAL #3   Title Patient sit to/from stand from chair without armrests using UEs to [place] with supervision.    Time 4   Period Weeks   Status On-going     PT SHORT TERM GOAL #4   Title Patient ambulates 500' with rollator Kai with supervision.    Time 4   Period Weeks   Status On-going     PT SHORT TERM GOAL #5   Title Patient negotiates ramps & curbs with rollator [place] with minA.    Time 4   Period Weeks   Status On-going           PT Long Term Goals - 11/28/16 2322      PT LONG TERM GOAL #1   Title Patient will verbalize understanding and return demonstration for ongoing HEP.    Baseline 11/23/2016 progressing with current HEP but needs skilled progression as mobility, balance & strength progress.    Time 8   Period Weeks   Status On-going     PT LONG TERM GOAL #6   Title Patient will improvem FOTO score by >/= 10% to indicate improvement in self-perceived functional mobility. (TARGET DATE: 11/25/2016)    Baseline Pt will retake at discharge.   Time 8   Period Weeks   Status On-going     PT LONG TERM GOAL #7   Title Patient ambulates 500' with LRAD modified independent.    Time 8   Period Weeks   Status  On-going     PT LONG TERM GOAL #8   Title Patient negotiates ramps, curbs & stairs with LRAD modified independent.    Time 8   Period Weeks   Status On-going     PT LONG TERM GOAL  #9   TITLE  Berg Balance >36/56    Time 8   Period Weeks   Status On-going               Plan - 11/28/16 2323    Clinical Impression Statement Patient has increased pain following spinal tap 4 days ago. PT performed coordination exercises supine and manual stretches which appeared to improve pain. Her gait with rollator Kaman was improved exiting PT compared to entering PT today.    Rehab Potential Good   Clinical Impairments Affecting Rehab Potential HTN, cervical spondylosis, C6-7 L side disc protrusion causing L foraminal impingement, anemia, functional neurological disorder, anemia     PT Frequency 2x / week   PT Duration Other (comment)   PT Treatment/Interventions ADLs/Self Care Home Management;Electrical Stimulation;Neuromuscular re-education;Balance training;Therapeutic exercise;Therapeutic activities;Functional mobility training;Stair training;Gait training;DME Instruction;Patient/family education;Energy conservation   PT Next Visit Plan work towards updated STGs including gait outdoors with rollator [place]ett.    Consulted and Agree with Plan of Care Patient      Patient will benefit from skilled therapeutic intervention in order to improve the following deficits and impairments:  Decreased activity tolerance, Decreased balance, Decreased mobility, Decreased knowledge of use of DME, Decreased endurance, Decreased strength, Impaired flexibility, Impaired tone  Visit Diagnosis: Other symptoms and signs involving the nervous system  Unsteadiness on feet  Other abnormalities of gait and mobility  Muscle weakness (generalized)     Problem List Patient Active Problem List   Diagnosis Date Noted  . Abnormal laboratory test 09/09/2016  . Gait abnormality 08/03/2016  . Weakness  08/03/2016  . Paresthesia 08/03/2016  . S/P cervical spinal fusion 10/23/2015  . Hypersomnia 12/08/2010  . 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  . EPIGASTRIC PAIN 10/13/2009  . IRRITABLE BOWEL SYNDROME 03/10/2009  . BACK PAIN, LUMBAR 07/01/2008  . COSTOCHONDRITIS 02/01/2008  . VIRAL URI 01/08/2008  . CHEST PAIN 01/08/2008  . 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 ARM PAIN 12/25/2007  . ANEMIA-NOS 10/02/2007  . Essential hypertension 10/02/2007  . GERD 10/02/2007  . POLYP, GALLBLADDER 10/02/2007  . UTERINE POLYP 10/02/2007  . OSTEOARTHRITIS 10/02/2007  . Dyskinesia 10/02/2007  . HEADACHE 10/02/2007  . PALPITATIONS 10/02/2007  . CARDIAC MURMUR, HX OF 10/02/2007  . NEPHROLITHIASIS, HX OF 10/02/2007    Jamey Reas PT, DPT 11/28/2016, 11:29 PM  [facility] [address] [place], [place], [address] Phone: [phone]   Fax:  [phone]  Name: Amy Barry MRN: [id number] Date of Birth: 1971/01/22

## 2016-11-30 ENCOUNTER — Encounter: Payer: Self-pay | Admitting: Physical Therapy

## 2016-11-30 ENCOUNTER — Ambulatory Visit: Payer: 59 | Admitting: Physical Therapy

## 2016-11-30 DIAGNOSIS — R29818 Other symptoms and signs involving the nervous system: Secondary | ICD-10-CM | POA: Diagnosis not present

## 2016-11-30 DIAGNOSIS — R2689 Other abnormalities of gait and mobility: Secondary | ICD-10-CM

## 2016-11-30 DIAGNOSIS — M6281 Muscle weakness (generalized): Secondary | ICD-10-CM

## 2016-11-30 DIAGNOSIS — R260 Ataxic gait: Secondary | ICD-10-CM

## 2016-11-30 DIAGNOSIS — R2681 Unsteadiness on feet: Secondary | ICD-10-CM

## 2016-11-30 NOTE — Therapy (Signed)
[facility] [address] [place], [place], [address] Phone: [phone]   Fax:  [phone]  Physical Therapy Treatment  Patient Details  Name: Amy Barry MRN: [id number] Date of Birth: 03/31/1971 Referring Provider: Donald Prose MD   Encounter Date: 11/30/2016      PT End of Session - 11/30/16 2119    Visit Number 21   Number of Visits 35   Date for PT Re-Evaluation 01/20/17   Authorization Type [employer]    Authorization - Visit Number 21   Authorization - Number of Visits 60   PT Start Time 1448   PT Stop Time 1614   PT Time Calculation (min) 44 min   Equipment Utilized During Treatment Gait belt   Activity Tolerance Patient tolerated treatment well;Patient limited by fatigue   Behavior During Therapy WFL for tasks assessed/performed      Past Medical History:  Diagnosis Date  . Anemia   . Dysrhythmia    pvc's holter monitor started lopressor- no return visit  . GERD (gastroesophageal reflux disease)   . Headache(784.0)   . Heart murmur    child no echo  . History of kidney stones   . Hypertension   . IBS (irritable bowel syndrome)   . Low vitamin D level   . Lower extremity weakness   . Neuromuscular disorder ([place])   . Numbness and tingling   . Osteoarthritis   . Pneumonia    January- tx with IV antibiotics  for pneumonia in 04/2015   . PVC (premature ventricular contraction)   . Seizure disorder ([place])    denied seizures- has dyskinesia  . Sleep concern 2013   toldf that she doesn 't have apnea but she stays sleeping a lot  . Upper extremity weakness     Past Surgical History:  Procedure Laterality Date  . 2 ovarian cysts     age 55  . CERVICAL DISC ARTHROPLASTY N/A 10/23/2015   Procedure: Cervical Disc Arthroplasty Cervical six-seven;  Surgeon: Eustace Moore, MD;  Location: [facility];  Service: Neurosurgery;  Laterality: N/A;  . ESOPHAGOGASTRODUODENOSCOPY     for GERD  .  LAPAROSCOPIC ABDOMINAL EXPLORATION     dx ibs  . TUBAL LIGATION      There were no vitals filed for this visit.      Subjective Assessment - 11/30/16 1537    Subjective She is feelling better. She still has a headache. She has not heard from MD about spinal tap.    Patient is accompained by: Family member   Limitations Sitting;Lifting;Standing;Walking;House hold activities   How long can you sit comfortably? 2 hrs prior to onset of pain    Patient Stated Goals sitting upright, walking, driving, cooking, cleaning, return to work, walk the dog    Currently in Pain? Yes   Pain Score 6    Pain Location Generalized   Pain Orientation Right;Left   Pain Descriptors / Indicators Aching;Burning   Pain Type Chronic pain   Pain Onset More than a month ago   Pain Frequency Constant   Multiple Pain Sites Yes   Pain Score 6   Pain Location Head   Pain Orientation Anterior;Posterior  frontal & occipital lobe headache   Pain Descriptors / Indicators Stabbing;Throbbing   Pain Type Acute pain   Pain Onset In the past 7 days   Pain Frequency Constant   Pain Relieving Factors Advil  [facility] Adult PT Treatment/Exercise - 11/30/16 1530      Transfers   Transfers Sit to Stand;Stand to Sit   Sit to Stand 5: Supervision;With upper extremity assist;From chair/3-in-1  from chair with armrests   Sit to Stand Details Visual cues/gestures for sequencing;Verbal cues for technique   Stand to Sit 5: Supervision;With upper extremity assist;With armrests;To chair/3-in-1  from rollator Yiu   Stand to Sit Details (indicate cue type and reason) Verbal cues for technique;Visual cues/gestures for sequencing;Verbal cues for safe use of DME/AE     Ambulation/Gait   Ambulation/Gait Yes   Ambulation/Gait Assistance 4: Min assist   Ambulation/Gait Assistance Details tactile & verbal cues on upright posture, stepping without excessive hip flexion and pelvic stability    Ambulation Distance (Feet) 500 Feet  100' & 500'   Assistive device Rollator   Gait Pattern Step-through pattern;Decreased stride length;Right hip hike;Left hip hike;Right steppage;Left steppage;Ataxic;Trunk flexed;Lateral hip instability;Poor foot clearance - left;Poor foot clearance - right   Ambulation Surface Indoor;Level;Outdoor;Paved   Stairs Yes   Stairs Assistance 4: Min assist   Stairs Assistance Details (indicate cue type and reason) ascend reciprocal & descend step-to alt lead LE. Forward facing for improved muscle control.    Stair Management Technique Two rails;Alternating pattern;Step to pattern;Forwards   Number of Stairs 4  3 reps   Ramp 4: Min assist  rollator Cowher   Ramp Details (indicate cue type and reason) Verbal & tactile cues on rollator control, upright posture and wt shift over stance limb   Curb 4: Min assist  rollator Sheriff   Curb Details (indicate cue type and reason) verbal & tactile cues on rollator control/movement, sequence and step thru.      Self-Care   Self-Care ADL's   ADL's PT demo, instructed in modified stand / sitting on 24" stool for higher position with support and moving pots along counter with towel. Work on Armed forces technical officer decorating at table top sitting on 24" stool starting with small, simple projects.      Neuro Re-ed    Neuro Re-ed Details  sitting balance on chair with feet on floor & no back support: forward pass for 3 minutes slow pace with verbal cues on midline, righting trunk.                   PT Short Term Goals - 11/30/16 2120      PT SHORT TERM GOAL #1   Title Patient verbalizes and demonstrates understanding of updated HEP.    Time 4   Period Weeks   Status On-going   Target Date 12/23/16     PT SHORT TERM GOAL #2   Title Patient reaches 7" anteriorly and to knee level standing balance with single UE support on AD or counter with supervision.    Time 4   Period Weeks   Status On-going   Target Date  12/23/16     PT SHORT TERM GOAL #3   Title Patient sit to/from stand from chair without armrests using UEs to [place] with supervision.    Time 4   Period Weeks   Status On-going   Target Date 12/23/16     PT SHORT TERM GOAL #4   Title Patient ambulates 500' with rollator Vesely with supervision.    Time 4   Period Weeks   Status On-going   Target Date 12/23/16     PT SHORT TERM GOAL #5   Title Patient negotiates ramps & curbs with rollator  Mikels with minA.    Time 4   Period Weeks   Status On-going   Target Date 12/23/16           PT Long Term Goals - 11/28/16 2322      PT LONG TERM GOAL #1   Title Patient will verbalize understanding and return demonstration for ongoing HEP.    Baseline 11/23/2016 progressing with current HEP but needs skilled progression as mobility, balance & strength progress.    Time 8   Period Weeks   Status On-going     PT LONG TERM GOAL #6   Title Patient will improvem FOTO score by >/= 10% to indicate improvement in self-perceived functional mobility. (TARGET DATE: 11/25/2016)    Baseline Pt will retake at discharge.   Time 8   Period Weeks   Status On-going     PT LONG TERM GOAL #7   Title Patient ambulates 500' with LRAD modified independent.    Time 8   Period Weeks   Status On-going     PT LONG TERM GOAL #8   Title Patient negotiates ramps, curbs & stairs with LRAD modified independent.    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  Patient improved ability to negotiate ramps & curbs with rollator Amey with less assistance.    Rehab Potential Good   Clinical Impairments Affecting Rehab  Potential HTN, cervical spondylosis, C6-7 L side disc protrusion causing L foraminal impingement, anemia, functional neurological disorder, anemia     PT Frequency 2x / week   PT Duration Other (comment)   PT Treatment/Interventions ADLs/Self Care Home Management;Electrical Stimulation;Neuromuscular re-education;Balance training;Therapeutic exercise;Therapeutic activities;Functional mobility training;Stair training;Gait training;DME Instruction;Patient/family education;Energy conservation   PT Next Visit Plan work towards updated STGs including gait outdoors with rollator Hudman.    Consulted and Agree with Plan of Care Patient      Patient will benefit from skilled therapeutic intervention in order to improve the following deficits and impairments:  Decreased activity tolerance, Decreased balance, Decreased mobility, Decreased knowledge of use of DME, Decreased endurance, Decreased strength, Impaired flexibility, Impaired tone  Visit Diagnosis: Other symptoms and signs involving the nervous system  Unsteadiness on feet  Other abnormalities of gait and mobility  Muscle weakness (generalized)  Ataxic gait     Problem List Patient Active Problem List   Diagnosis Date Noted  . Abnormal laboratory test 09/09/2016  . Gait abnormality 08/03/2016  . Weakness 08/03/2016  . Paresthesia 08/03/2016  . S/P cervical spinal fusion 10/23/2015  . Hypersomnia 12/08/2010  . 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  . EPIGASTRIC PAIN 10/13/2009  . IRRITABLE BOWEL SYNDROME 03/10/2009  . BACK PAIN, LUMBAR 07/01/2008  . COSTOCHONDRITIS 02/01/2008  . VIRAL URI 01/08/2008  . CHEST PAIN 01/08/2008  . 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 ARM PAIN 12/25/2007  . ANEMIA-NOS 10/02/2007  . Essential hypertension 10/02/2007  . GERD 10/02/2007  . POLYP, GALLBLADDER 10/02/2007  . UTERINE POLYP 10/02/2007  . OSTEOARTHRITIS 10/02/2007  . Dyskinesia 10/02/2007  .  HEADACHE 10/02/2007  .  PALPITATIONS 10/02/2007  . CARDIAC MURMUR, HX OF 10/02/2007  . NEPHROLITHIASIS, HX OF 10/02/2007    Jamey Reas PT, DPT 11/30/2016, 9:24 PM  [facility] [address] [place], [place], [address] Phone: [phone]   Fax:  [phone]  Name: Amy Barry MRN: [id number] Date of Birth: June 04, 1970

## 2016-12-09 ENCOUNTER — Ambulatory Visit: Payer: 59 | Attending: Family Medicine | Admitting: Rehabilitation

## 2016-12-09 ENCOUNTER — Encounter: Payer: Self-pay | Admitting: Rehabilitation

## 2016-12-09 DIAGNOSIS — M6281 Muscle weakness (generalized): Secondary | ICD-10-CM | POA: Diagnosis present

## 2016-12-09 DIAGNOSIS — R2681 Unsteadiness on feet: Secondary | ICD-10-CM

## 2016-12-09 DIAGNOSIS — R29818 Other symptoms and signs involving the nervous system: Secondary | ICD-10-CM

## 2016-12-09 DIAGNOSIS — R2689 Other abnormalities of gait and mobility: Secondary | ICD-10-CM | POA: Diagnosis not present

## 2016-12-09 DIAGNOSIS — R260 Ataxic gait: Secondary | ICD-10-CM

## 2016-12-09 NOTE — Therapy (Signed)
[facility] [address] [place], [place], [address] Phone: [phone]   Fax:  [phone]  Physical Therapy Treatment  Patient Details  Name: Amy Barry MRN: [id number] Date of Birth: 1971/02/28 Referring Provider: Donald Prose MD   Encounter Date: 12/09/2016      PT End of Session - 12/09/16 1624    Visit Number 22   Number of Visits 35   Date for PT Re-Evaluation 01/20/17   Authorization Type [employer]    Authorization - Visit Number 22   Authorization - Number of Visits 60   PT Start Time 6962   PT Stop Time 1702   PT Time Calculation (min) 45 min   Equipment Utilized During Treatment Gait belt   Activity Tolerance Patient tolerated treatment well;Patient limited by fatigue   Behavior During Therapy WFL for tasks assessed/performed      Past Medical History:  Diagnosis Date  . Anemia   . Dysrhythmia    pvc's holter monitor started lopressor- no return visit  . GERD (gastroesophageal reflux disease)   . Headache(784.0)   . Heart murmur    child no echo  . History of kidney stones   . Hypertension   . IBS (irritable bowel syndrome)   . Low vitamin D level   . Lower extremity weakness   . Neuromuscular disorder ([place])   . Numbness and tingling   . Osteoarthritis   . Pneumonia    January- tx with IV antibiotics  for pneumonia in 04/2015   . PVC (premature ventricular contraction)   . Seizure disorder ([place])    denied seizures- has dyskinesia  . Sleep concern 2013   toldf that she doesn 't have apnea but she stays sleeping a lot  . Upper extremity weakness     Past Surgical History:  Procedure Laterality Date  . 2 ovarian cysts     age 53  . CERVICAL DISC ARTHROPLASTY N/A 10/23/2015   Procedure: Cervical Disc Arthroplasty Cervical six-seven;  Surgeon: Eustace Moore, MD;  Location: [facility];  Service: Neurosurgery;  Laterality: N/A;  . ESOPHAGOGASTRODUODENOSCOPY     for GERD  .  LAPAROSCOPIC ABDOMINAL EXPLORATION     dx ibs  . TUBAL LIGATION      There were no vitals filed for this visit.      Subjective Assessment - 12/09/16 1620    Subjective Reports took two steps at home without rollator, family close.  No results from spinal tap.  Has follow up on the 28th.     Patient is accompained by: Family member   Limitations Sitting;Lifting;Standing;Walking;House hold activities   How long can you sit comfortably? 2 hrs prior to onset of pain    Patient Stated Goals sitting upright, walking, driving, cooking, cleaning, return to work, walk the dog    Currently in Pain? Yes   Pain Score 4    Pain Location Generalized   Pain Orientation Right;Left   Pain Descriptors / Indicators Aching;Burning   Pain Type Chronic pain   Pain Onset More than a month ago   Pain Frequency Constant   Aggravating Factors  nothing   Pain Relieving Factors rest               NMR: Worked on improved quality of gait and improved postural control with body weight supported treadmill gait training.  Performed 3 bouts of gait during session x 2-3 mins each at .5 mph with BUE support, no incline.  During first bout, provided assist at pelvis/trunk for improved posture and forward/lateral weight shift over stance LE.  Note that she continues to demonstrate poor coordination in BLEs, esp on RLE, difficult with LE placement and lack of knee control with either hyperextension or buckle (not to the point of full LOB).  Therefore during last two bouts of gait PT provided assist at LLE and therapy tech provided assist at RLE at knee and foot to place foot properly, preventing hyperextension at knee, and providing approximation through LE during stance to increase proprioceptive feedback for improved weight shift.  Pt tolerated better with cues for changing hand placement closer to body to prevent forward flexed posture.  Pt needing seated rest break between each bout.  Ended with standing stepping  task with PT providing assist at both R then L LE in stance mode for correct hip/knee alignment while she advanced/retrostepped opposite LE x 5 reps each.                     PT Education - 12/09/16 2044    Education provided Yes   Education Details purpose of treadmill training   Person(s) Educated Patient   Methods Explanation   Comprehension Verbalized understanding          PT Short Term Goals - 11/30/16 2120      PT SHORT TERM GOAL #1   Title Patient verbalizes and demonstrates understanding of updated HEP.    Time 4   Period Weeks   Status On-going   Target Date 12/23/16     PT SHORT TERM GOAL #2   Title Patient reaches 7" anteriorly and to knee level standing balance with single UE support on AD or counter with supervision.    Time 4   Period Weeks   Status On-going   Target Date 12/23/16     PT SHORT TERM GOAL #3   Title Patient sit to/from stand from chair without armrests using UEs to Muse with supervision.    Time 4   Period Weeks   Status On-going   Target Date 12/23/16     PT SHORT TERM GOAL #4   Title Patient ambulates 500' with rollator Schlesinger with supervision.    Time 4   Period Weeks   Status On-going   Target Date 12/23/16     PT SHORT TERM GOAL #5   Title Patient negotiates ramps & curbs with rollator Cimini with minA.    Time 4   Period Weeks   Status On-going   Target Date 12/23/16           PT Long Term Goals - 11/28/16 2322      PT LONG TERM GOAL #1   Title Patient will verbalize understanding and return demonstration for ongoing HEP.    Baseline 11/23/2016 progressing with current HEP but needs skilled progression as mobility, balance & strength progress.    Time 8   Period Weeks   Status On-going     PT LONG TERM GOAL #6   Title Patient will improvem FOTO score by >/= 10% to indicate improvement in self-perceived functional mobility. (TARGET DATE: 11/25/2016)    Baseline Pt will retake at discharge.   Time 8    Period Weeks   Status On-going     PT LONG TERM GOAL #7   Title Patient ambulates 500' with LRAD modified independent.    Time 8   Period Weeks   Status On-going     PT  LONG TERM GOAL #8   Title Patient negotiates ramps, curbs & stairs with LRAD modified independent.    Time 8   Period Weeks   Status On-going     PT LONG TERM GOAL  #9   TITLE Berg Balance >36/56    Time 8   Period Weeks   Status On-going               Plan - 12/09/16 2046    Clinical Impression Statement Skilled session focused on body weight supported system over treadmill to improve quality of gait and improved postural control.  Pt tolerated well but with moderate fatigue.  Pt needing assist via wheeled on rollator from PT at end of session to conserve energy to ambulate from lobby to car.     Rehab Potential Good   Clinical Impairments Affecting Rehab Potential HTN, cervical spondylosis, C6-7 L side disc protrusion causing L foraminal impingement, anemia, functional neurological disorder, anemia     PT Frequency 2x / week   PT Duration Other (comment)   PT Treatment/Interventions ADLs/Self Care Home Management;Electrical Stimulation;Neuromuscular re-education;Balance training;Therapeutic exercise;Therapeutic activities;Functional mobility training;Stair training;Gait training;DME Instruction;Patient/family education;Energy conservation   PT Next Visit Plan work towards updated STGs including gait outdoors with rollator Holston.    Consulted and Agree with Plan of Care Patient      Patient will benefit from skilled therapeutic intervention in order to improve the following deficits and impairments:  Decreased activity tolerance, Decreased balance, Decreased mobility, Decreased knowledge of use of DME, Decreased endurance, Decreased strength, Impaired flexibility, Impaired tone  Visit Diagnosis: Other symptoms and signs involving the nervous system  Unsteadiness on feet  Other abnormalities of gait  and mobility  Muscle weakness (generalized)  Ataxic gait     Problem List Patient Active Problem List   Diagnosis Date Noted  . Abnormal laboratory test 09/09/2016  . Gait abnormality 08/03/2016  . Weakness 08/03/2016  . Paresthesia 08/03/2016  . S/P cervical spinal fusion 10/23/2015  . Hypersomnia 12/08/2010  . 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  . EPIGASTRIC PAIN 10/13/2009  . IRRITABLE BOWEL SYNDROME 03/10/2009  . BACK PAIN, LUMBAR 07/01/2008  . COSTOCHONDRITIS 02/01/2008  . VIRAL URI 01/08/2008  . CHEST PAIN 01/08/2008  . 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 ARM PAIN 12/25/2007  . ANEMIA-NOS 10/02/2007  . Essential hypertension 10/02/2007  . GERD 10/02/2007  . POLYP, GALLBLADDER 10/02/2007  . UTERINE POLYP 10/02/2007  . OSTEOARTHRITIS 10/02/2007  . Dyskinesia 10/02/2007  . HEADACHE 10/02/2007  . PALPITATIONS 10/02/2007  . CARDIAC MURMUR, HX OF 10/02/2007  . NEPHROLITHIASIS, HX OF 10/02/2007    Cameron Sprang, PT, MPT [facility] [address] [place], [place], [address] Phone: [phone]   Fax:  [phone] 12/09/16, 8:56 PM  Name: Amy Barry MRN: [id number] Date of Birth: 1970/09/29

## 2016-12-12 ENCOUNTER — Encounter: Payer: Self-pay | Admitting: Physical Therapy

## 2016-12-12 ENCOUNTER — Ambulatory Visit: Payer: 59 | Admitting: Physical Therapy

## 2016-12-12 DIAGNOSIS — R29818 Other symptoms and signs involving the nervous system: Secondary | ICD-10-CM | POA: Diagnosis not present

## 2016-12-12 DIAGNOSIS — R2689 Other abnormalities of gait and mobility: Secondary | ICD-10-CM

## 2016-12-12 DIAGNOSIS — M6281 Muscle weakness (generalized): Secondary | ICD-10-CM

## 2016-12-12 DIAGNOSIS — R2681 Unsteadiness on feet: Secondary | ICD-10-CM

## 2016-12-12 DIAGNOSIS — R260 Ataxic gait: Secondary | ICD-10-CM

## 2016-12-12 NOTE — Therapy (Signed)
[facility] [address] [place], [place], [address] Phone: [phone]   Fax:  [phone]  Physical Therapy Treatment  Patient Details  Name: Amy Barry MRN: [id number] Date of Birth: 27-Jun-1970 Referring Provider: Donald Prose MD   Encounter Date: 12/12/2016      PT End of Session - 12/12/16 2243    Visit Number 23   Number of Visits 35   Date for PT Re-Evaluation 01/20/17   Authorization Type [employer]    Authorization - Visit Number 23   Authorization - Number of Visits 60   PT Start Time 0932   PT Stop Time 1616   PT Time Calculation (min) 41 min   Equipment Utilized During Treatment Gait belt   Activity Tolerance Patient tolerated treatment well   Behavior During Therapy WFL for tasks assessed/performed      Past Medical History:  Diagnosis Date  . Anemia   . Dysrhythmia    pvc's holter monitor started lopressor- no return visit  . GERD (gastroesophageal reflux disease)   . Headache(784.0)   . Heart murmur    child no echo  . History of kidney stones   . Hypertension   . IBS (irritable bowel syndrome)   . Low vitamin D level   . Lower extremity weakness   . Neuromuscular disorder ([place])   . Numbness and tingling   . Osteoarthritis   . Pneumonia    January- tx with IV antibiotics  for pneumonia in 04/2015   . PVC (premature ventricular contraction)   . Seizure disorder ([place])    denied seizures- has dyskinesia  . Sleep concern 2013   toldf that she doesn 't have apnea but she stays sleeping a lot  . Upper extremity weakness     Past Surgical History:  Procedure Laterality Date  . 2 ovarian cysts     age 19  . CERVICAL DISC ARTHROPLASTY N/A 10/23/2015   Procedure: Cervical Disc Arthroplasty Cervical six-seven;  Surgeon: Eustace Moore, MD;  Location: [facility];  Service: Neurosurgery;  Laterality: N/A;  . ESOPHAGOGASTRODUODENOSCOPY     for GERD  . LAPAROSCOPIC ABDOMINAL  EXPLORATION     dx ibs  . TUBAL LIGATION      There were no vitals filed for this visit.      Subjective Assessment - 12/12/16 1542    Subjective No falls. Still no news from [facility]. She helped mother-law at church taking money sitting in chair. She sat for 1hr for 4 sets. She was exhausted next day.    Limitations Sitting;Lifting;Standing;Walking;House hold activities   Patient Stated Goals sitting upright, walking, driving, cooking, cleaning, return to work, walk the dog    Currently in Pain? Yes   Pain Score 4    Pain Location Generalized   Pain Orientation Right;Left   Pain Descriptors / Indicators Aching;Burning   Pain Type Chronic pain   Pain Onset More than a month ago   Pain Frequency Constant   Aggravating Factors  nothing    Pain Relieving Factors rest                         [facility] Adult PT Treatment/Exercise - 12/12/16 1535      Transfers   Transfers Sit to Stand;Stand to Sit   Sit to Stand 5: Supervision;With upper extremity assist;From chair/3-in-1;With armrests  from chair with armrests   Sit to Stand Details Visual cues/gestures for sequencing;Verbal  cues for technique   Stand to Sit 5: Supervision;With upper extremity assist;With armrests;To chair/3-in-1  from rollator Bench   Stand to Sit Details (indicate cue type and reason) Verbal cues for technique;Visual cues/gestures for sequencing;Verbal cues for safe use of DME/AE   Stand Pivot Transfers 5: Supervision  turning 180* to sit on rollator Shenk seat     Ambulation/Gait   Ambulation/Gait Yes   Ambulation/Gait Assistance 4: Min assist;3: Mod assist  ModA 2 people without AD, MinA rollator   Ambulation/Gait Assistance Details +2 hand hold assist without AD: manual cues at pelvis for weight shift & stabilization,  With rollator Pearce, verbal & tactile cues on pelvic motion, upright posture & LE step pattern to minimize steppage gait.    Ambulation Distance (Feet) 500 Feet  175' no  AD, 200' & 500' with rollator   Assistive device Rollator;2 person hand held assist   Gait Pattern Step-through pattern;Decreased stride length;Right hip hike;Left hip hike;Right steppage;Left steppage;Ataxic;Trunk flexed;Lateral hip instability;Poor foot clearance - left;Poor foot clearance - right   Ambulation Surface Indoor;Level;Outdoor;Paved   Stairs Yes   Stairs Assistance 4: Min assist   Stairs Assistance Details (indicate cue type and reason) manual, demo & verbal cues on reciprocal technique, LE movement pattern & wt shift   Stair Management Technique Two rails;Alternating pattern;Forwards   Number of Stairs 4  5 reps   Ramp 4: Min assist  rollator Theilen   Ramp Details (indicate cue type and reason) verbal & tactile cues on technique   Curb 4: Min assist  rollator Garrott   Curb Details (indicate cue type and reason) verbal & tactile cues on technique     Self-Care   Self-Care ADL's   ADL's PT verbally reviewed recommendation to use 24" stool for sitting /standing ADLs     Neuro Re-ed    Neuro Re-ed Details  propelling rollator using LEs / hamstrings 40' with manual & verbal cues                  PT Short Term Goals - 11/30/16 2120      PT SHORT TERM GOAL #1   Title Patient verbalizes and demonstrates understanding of updated HEP.    Time 4   Period Weeks   Status On-going   Target Date 12/23/16     PT SHORT TERM GOAL #2   Title Patient reaches 7" anteriorly and to knee level standing balance with single UE support on AD or counter with supervision.    Time 4   Period Weeks   Status On-going   Target Date 12/23/16     PT SHORT TERM GOAL #3   Title Patient sit to/from stand from chair without armrests using UEs to [place] with supervision.    Time 4   Period Weeks   Status On-going   Target Date 12/23/16     PT SHORT TERM GOAL #4   Title Patient ambulates 500' with rollator Masri with supervision.    Time 4   Period Weeks   Status On-going    Target Date 12/23/16     PT SHORT TERM GOAL #5   Title Patient negotiates ramps & curbs with rollator Lupinacci with minA.    Time 4   Period Weeks   Status On-going   Target Date 12/23/16           PT Long Term Goals - 11/28/16 2322      PT LONG TERM GOAL #1   Title Patient will  verbalize understanding and return demonstration for ongoing HEP.    Baseline 11/23/2016 progressing with current HEP but needs skilled progression as mobility, balance & strength progress.    Time 8   Period Weeks   Status On-going     PT LONG TERM GOAL #6   Title Patient will improvem FOTO score by >/= 10% to indicate improvement in self-perceived functional mobility. (TARGET DATE: 11/25/2016)    Baseline Pt will retake at discharge.   Time 8   Period Weeks   Status On-going     PT LONG TERM GOAL #7   Title Patient ambulates 500' with LRAD modified independent.    Time 8   Period Weeks   Status On-going     PT LONG TERM GOAL #8   Title Patient negotiates ramps, curbs & stairs with LRAD modified independent.    Time 8   Period Weeks   Status On-going     PT LONG TERM GOAL  #9   TITLE Berg Balance >36/56    Time 8   Period Weeks   Status On-going               Plan - 12/12/16 2243    Clinical Impression Statement Patient needed less assist for rollator Mascio gait today. She reports increased activities outside of PT but continues to fatigue. Patient appears on target to meet STGs next week.    Rehab Potential Good   Clinical Impairments Affecting Rehab Potential HTN, cervical spondylosis, C6-7 L side disc protrusion causing L foraminal impingement, anemia, functional neurological disorder, anemia     PT Frequency 2x / week   PT Duration Other (comment)   PT Treatment/Interventions ADLs/Self Care Home Management;Electrical Stimulation;Neuromuscular re-education;Balance training;Therapeutic exercise;Therapeutic activities;Functional mobility training;Stair training;Gait training;DME  Instruction;Patient/family education;Energy conservation   PT Next Visit Plan work towards updated STGs including gait outdoors with rollator Coulibaly.    Consulted and Agree with Plan of Care Patient      Patient will benefit from skilled therapeutic intervention in order to improve the following deficits and impairments:  Decreased activity tolerance, Decreased balance, Decreased mobility, Decreased knowledge of use of DME, Decreased endurance, Decreased strength, Impaired flexibility, Impaired tone  Visit Diagnosis: Other symptoms and signs involving the nervous system  Unsteadiness on feet  Other abnormalities of gait and mobility  Muscle weakness (generalized)  Ataxic gait     Problem List Patient Active Problem List   Diagnosis Date Noted  . Abnormal laboratory test 09/09/2016  . Gait abnormality 08/03/2016  . Weakness 08/03/2016  . Paresthesia 08/03/2016  . S/P cervical spinal fusion 10/23/2015  . Hypersomnia 12/08/2010  . 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  . EPIGASTRIC PAIN 10/13/2009  . IRRITABLE BOWEL SYNDROME 03/10/2009  . BACK PAIN, LUMBAR 07/01/2008  . COSTOCHONDRITIS 02/01/2008  . VIRAL URI 01/08/2008  . CHEST PAIN 01/08/2008  . 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 ARM PAIN 12/25/2007  . ANEMIA-NOS 10/02/2007  . Essential hypertension 10/02/2007  . GERD 10/02/2007  . POLYP, GALLBLADDER 10/02/2007  . UTERINE POLYP 10/02/2007  . OSTEOARTHRITIS 10/02/2007  . Dyskinesia 10/02/2007  . HEADACHE 10/02/2007  . PALPITATIONS 10/02/2007  . CARDIAC MURMUR, HX OF 10/02/2007  . NEPHROLITHIASIS, HX OF 10/02/2007    Jamey Reas PT, DPT 12/12/2016, 10:46 PM  [facility] [address] [place], [place], [address] Phone: [phone]   Fax:  [phone]  Name: Amy Barry MRN: [id number] Date of Birth: [birth year]

## 2016-12-14 ENCOUNTER — Encounter: Payer: Self-pay | Admitting: Physical Therapy

## 2016-12-14 ENCOUNTER — Ambulatory Visit: Payer: 59 | Admitting: Physical Therapy

## 2016-12-14 DIAGNOSIS — R2689 Other abnormalities of gait and mobility: Secondary | ICD-10-CM

## 2016-12-14 DIAGNOSIS — R260 Ataxic gait: Secondary | ICD-10-CM

## 2016-12-14 DIAGNOSIS — R29818 Other symptoms and signs involving the nervous system: Secondary | ICD-10-CM

## 2016-12-14 DIAGNOSIS — R2681 Unsteadiness on feet: Secondary | ICD-10-CM

## 2016-12-14 DIAGNOSIS — M6281 Muscle weakness (generalized): Secondary | ICD-10-CM

## 2016-12-15 NOTE — Therapy (Signed)
[facility] [address] [place], [place], [address] Phone: [phone]   Fax:  [phone]  Physical Therapy Treatment  Patient Details  Name: Amy Barry MRN: [id number] Date of Birth: [birth year] Referring Provider: Donald Prose MD   Encounter Date: 12/14/2016      PT End of Session - 12/14/16 1821    Visit Number 24   Number of Visits 35   Date for PT Re-Evaluation 01/20/17   Authorization Type [employer]    Authorization - Visit Number 24   Authorization - Number of Visits 60   PT Start Time 3810   PT Stop Time 1616   PT Time Calculation (min) 46 min   Equipment Utilized During Treatment Gait belt   Activity Tolerance Patient tolerated treatment well   Behavior During Therapy WFL for tasks assessed/performed      Past Medical History:  Diagnosis Date  . Anemia   . Dysrhythmia    pvc's holter monitor started lopressor- no return visit  . GERD (gastroesophageal reflux disease)   . Headache(784.0)   . Heart murmur    child no echo  . History of kidney stones   . Hypertension   . IBS (irritable bowel syndrome)   . Low vitamin D level   . Lower extremity weakness   . Neuromuscular disorder ([place])   . Numbness and tingling   . Osteoarthritis   . Pneumonia    January- tx with IV antibiotics  for pneumonia in 04/2015   . PVC (premature ventricular contraction)   . Seizure disorder ([place])    denied seizures- has dyskinesia  . Sleep concern 2013   toldf that she doesn 't have apnea but she stays sleeping a lot  . Upper extremity weakness     Past Surgical History:  Procedure Laterality Date  . 2 ovarian cysts     age 54  . CERVICAL DISC ARTHROPLASTY N/A 10/23/2015   Procedure: Cervical Disc Arthroplasty Cervical six-seven;  Surgeon: Eustace Moore, MD;  [facility];  Service: Neurosurgery;  Laterality: N/A;  . ESOPHAGOGASTRODUODENOSCOPY     for GERD  . LAPAROSCOPIC ABDOMINAL  EXPLORATION     dx ibs  . TUBAL LIGATION      There were no vitals filed for this visit.      Subjective Assessment - 12/14/16 1538    Subjective No falls. Work is looking at modifications for desk job. She is able to sit at her desk for 1 hr 2x/day.    Limitations Sitting;Lifting;Standing;Walking;House hold activities   How long can you sit comfortably? 2 hrs prior to onset of pain    Patient Stated Goals sitting upright, walking, driving, cooking, cleaning, return to work, walk the dog    Currently in Pain? Yes   Pain Score 4    Pain Location Generalized   Pain Orientation Right;Left   Pain Descriptors / Indicators Aching;Burning   Pain Type Chronic pain   Pain Onset More than a month ago   Pain Frequency Constant   Aggravating Factors  nothing    Pain Relieving Factors rest                         [facility] Adult PT Treatment/Exercise - 12/14/16 1530      Transfers   Transfers Sit to Stand;Stand to Sit   Sit to Stand 5: Supervision;With upper extremity assist;From chair/3-in-1;With armrests  from rolling desk chair with armrests  stabilized against wal   Sit to Stand Details Visual cues/gestures for sequencing;Verbal cues for technique   Sit to Stand Details (indicate cue type and reason) verbal cues on stabilizing rolling chair against wall or sturdy furniture for safety   Stand to Sit 5: Supervision;With upper extremity assist;With armrests;To chair/3-in-1  to rolling desk chair stablized against wall   Stand to Sit Details (indicate cue type and reason) Verbal cues for technique;Visual cues/gestures for sequencing;Verbal cues for safe use of DME/AE   Stand to Sit Details verbal cues on stabilizing rolling chair against wall or sturdy furniture for safety   Stand Pivot Transfers 5: Supervision  turning 180* to sit on rollator [place] seat     Ambulation/Gait   Ambulation/Gait Yes   Ambulation/Gait Assistance 4: Min assist;5: Supervision  SBA first 100' then  MinA due to increased ataxia   Ambulation/Gait Assistance Details verbal, tactile cues on LE motion to decrease steppage gait, pelvic weight shift, upright posture and rollator control    Ambulation Distance (Feet) 500 Feet  500' X 2   Assistive device Rollator   Gait Pattern Step-through pattern;Decreased stride length;Right hip hike;Left hip hike;Right steppage;Left steppage;Ataxic;Trunk flexed;Lateral hip instability;Poor foot clearance - left;Poor foot clearance - right   Ambulation Surface Level;Indoor;Outdoor;Paved   Stairs Yes   Stairs Assistance 4: Min assist   Stairs Assistance Details (indicate cue type and reason) maintaining pelvic orientation forward & ankle motion with descending.    Stair Management Technique Two rails;Alternating pattern;Forwards   Number of Stairs 4  3 reps   Ramp 4: Min assist  rollator Kipper   Ramp Details (indicate cue type and reason) verbal cues on technique   Curb 4: Min assist  rollator Laneve   Curb Details (indicate cue type and reason) verbal cues on technique.      Self-Care   Self-Care ADL's   ADL's Sitting posture at desk using 4" block for BLE support. Armrests supporting elbows with prolonged sitting & performing computer / hand work.  Pt tolerating 1 hr 2x/day & PT recommended increase to 1 hr 3x/day.  Also PT requested to videotape layout of office for problem solving movement in area. Pt verbalized understanding.      Neuro Re-ed    Neuro Re-ed Details  propelling rollator using LEs / hamstrings 40' with manual & verbal cues     Knee/Hip Exercises: Standing   Lateral Step Up Right;Left;Step Height: 6"  4 steps sideways with BUE on single rail; to rt & to lt   Lateral Step Up Limitations manual & verbal cues on LE movement & weight shift.                   PT Short Term Goals - 11/30/16 2120      PT SHORT TERM GOAL #1   Title Patient verbalizes and demonstrates understanding of updated HEP.    Time 4   Period Weeks    Status On-going   Target Date 12/23/16     PT SHORT TERM GOAL #2   Title Patient reaches 7" anteriorly and to knee level standing balance with single UE support on AD or counter with supervision.    Time 4   Period Weeks   Status On-going   Target Date 12/23/16     PT SHORT TERM GOAL #3   Title Patient sit to/from stand from chair without armrests using UEs to [place] with supervision.    Time 4   Period Weeks   Status  On-going   Target Date 12/23/16     PT SHORT TERM GOAL #4   Title Patient ambulates 500' with rollator Grubb with supervision.    Time 4   Period Weeks   Status On-going   Target Date 12/23/16     PT SHORT TERM GOAL #5   Title Patient negotiates ramps & curbs with rollator Gonzales with minA.    Time 4   Period Weeks   Status On-going   Target Date 12/23/16           PT Long Term Goals - 11/28/16 2322      PT LONG TERM GOAL #1   Title Patient will verbalize understanding and return demonstration for ongoing HEP.    Baseline 11/23/2016 progressing with current HEP but needs skilled progression as mobility, balance & strength progress.    Time 8   Period Weeks   Status On-going     PT LONG TERM GOAL #6   Title Patient will improvem FOTO score by >/= 10% to indicate improvement in self-perceived functional mobility. (TARGET DATE: 11/25/2016)    Baseline Pt will retake at discharge.   Time 8   Period Weeks   Status On-going     PT LONG TERM GOAL #7   Title Patient ambulates 500' with LRAD modified independent.    Time 8   Period Weeks   Status On-going     PT LONG TERM GOAL #8   Title Patient negotiates ramps, curbs & stairs with LRAD modified independent.    Time 8   Period Weeks   Status On-going     PT LONG TERM GOAL  #9   TITLE Berg Balance >36/56    Time 8   Period Weeks   Status On-going               Plan - 12/14/16 1822    Clinical Impression Statement Patient has better understanding of use of rolling desk chair &  ergonomics for sitting at her desk. PT recommended increase from 1hr 2x/day to 1 hr 3x/day sitting at desk working on computer.  Patient improved gait with rollator with less manual assist at pelvis to control ataxia.    Rehab Potential Good   Clinical Impairments Affecting Rehab Potential HTN, cervical spondylosis, C6-7 L side disc protrusion causing L foraminal impingement, anemia, functional neurological disorder, anemia     PT Frequency 2x / week   PT Duration Other (comment)   PT Treatment/Interventions ADLs/Self Care Home Management;Electrical Stimulation;Neuromuscular re-education;Balance training;Therapeutic exercise;Therapeutic activities;Functional mobility training;Stair training;Gait training;DME Instruction;Patient/family education;Energy conservation   PT Next Visit Plan assess STGs. gait outdoors with rollator Khamis. neuromuscular activities to improve standing balance & control.    Consulted and Agree with Plan of Care Patient      Patient will benefit from skilled therapeutic intervention in order to improve the following deficits and impairments:  Decreased activity tolerance, Decreased balance, Decreased mobility, Decreased knowledge of use of DME, Decreased endurance, Decreased strength, Impaired flexibility, Impaired tone  Visit Diagnosis: Other symptoms and signs involving the nervous system  Unsteadiness on feet  Other abnormalities of gait and mobility  Muscle weakness (generalized)  Ataxic gait     Problem List Patient Active Problem List   Diagnosis Date Noted  . Abnormal laboratory test 09/09/2016  . Gait abnormality 08/03/2016  . Weakness 08/03/2016  . Paresthesia 08/03/2016  . S/P cervical spinal fusion 10/23/2015  . Hypersomnia 12/08/2010  . 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  . EPIGASTRIC PAIN 10/13/2009  .  IRRITABLE BOWEL SYNDROME 03/10/2009  . BACK PAIN, LUMBAR 07/01/2008  . COSTOCHONDRITIS 02/01/2008  . VIRAL URI 01/08/2008  . CHEST PAIN  01/08/2008  . 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 ARM PAIN 12/25/2007  . ANEMIA-NOS 10/02/2007  . Essential hypertension 10/02/2007  . GERD 10/02/2007  . POLYP, GALLBLADDER 10/02/2007  . UTERINE POLYP 10/02/2007  . OSTEOARTHRITIS 10/02/2007  . Dyskinesia 10/02/2007  . HEADACHE 10/02/2007  . PALPITATIONS 10/02/2007  . CARDIAC MURMUR, HX OF 10/02/2007  . NEPHROLITHIASIS, HX OF 10/02/2007    Jamey Reas PT, DPT 12/15/2016, 11:40 AM  [facility] [address] [place], [place], [address] Phone: [phone]   Fax:  [phone]  Name: Amy Barry MRN: [id number] Date of Birth: 31-Oct-1970

## 2016-12-18 DIAGNOSIS — R531 Weakness: Secondary | ICD-10-CM | POA: Diagnosis not present

## 2016-12-18 DIAGNOSIS — R29898 Other symptoms and signs involving the musculoskeletal system: Secondary | ICD-10-CM | POA: Diagnosis not present

## 2016-12-19 ENCOUNTER — Ambulatory Visit: Payer: 59 | Admitting: Physical Therapy

## 2016-12-21 ENCOUNTER — Ambulatory Visit: Payer: 59 | Admitting: Physical Therapy

## 2016-12-21 ENCOUNTER — Encounter: Payer: Self-pay | Admitting: Physical Therapy

## 2016-12-21 DIAGNOSIS — R260 Ataxic gait: Secondary | ICD-10-CM

## 2016-12-21 DIAGNOSIS — R2689 Other abnormalities of gait and mobility: Secondary | ICD-10-CM

## 2016-12-21 DIAGNOSIS — R2681 Unsteadiness on feet: Secondary | ICD-10-CM

## 2016-12-21 DIAGNOSIS — R29818 Other symptoms and signs involving the nervous system: Secondary | ICD-10-CM | POA: Diagnosis not present

## 2016-12-21 DIAGNOSIS — M6281 Muscle weakness (generalized): Secondary | ICD-10-CM

## 2016-12-22 ENCOUNTER — Ambulatory Visit: Payer: 59 | Admitting: Neurology

## 2016-12-22 NOTE — Therapy (Signed)
[facility] [address] [place], [place], [address] Phone: [phone]   Fax:  [phone]  Physical Therapy Treatment  Patient Details  Name: Amy Barry MRN: [id number] Date of Birth: September 18, 1970 Referring Provider: Donald Prose MD   Encounter Date: 12/21/2016      PT End of Session - 12/21/16 1905    Visit Number 25   Number of Visits 35   Date for PT Re-Evaluation 01/20/17   Authorization Type [employer]    Authorization - Visit Number 25   Authorization - Number of Visits 60   PT Start Time 7035   PT Stop Time 1615   PT Time Calculation (min) 45 min   Equipment Utilized During Treatment Gait belt   Activity Tolerance Patient tolerated treatment well   Behavior During Therapy WFL for tasks assessed/performed      Past Medical History:  Diagnosis Date  . Anemia   . Dysrhythmia    pvc's holter monitor started lopressor- no return visit  . GERD (gastroesophageal reflux disease)   . Headache(784.0)   . Heart murmur    child no echo  . History of kidney stones   . Hypertension   . IBS (irritable bowel syndrome)   . Low vitamin D level   . Lower extremity weakness   . Neuromuscular disorder ([place])   . Numbness and tingling   . Osteoarthritis   . Pneumonia    January- tx with IV antibiotics  for pneumonia in 04/2015   . PVC (premature ventricular contraction)   . Seizure disorder ([place])    denied seizures- has dyskinesia  . Sleep concern 2013   toldf that she doesn 't have apnea but she stays sleeping a lot  . Upper extremity weakness     Past Surgical History:  Procedure Laterality Date  . 2 ovarian cysts     age 43  . CERVICAL DISC ARTHROPLASTY N/A 10/23/2015   Procedure: Cervical Disc Arthroplasty Cervical six-seven;  Surgeon: Eustace Moore, MD;  Location: [facility];  Service: Neurosurgery;  Laterality: N/A;  . ESOPHAGOGASTRODUODENOSCOPY     for GERD  . LAPAROSCOPIC ABDOMINAL  EXPLORATION     dx ibs  . TUBAL LIGATION      There were no vitals filed for this visit.      Subjective Assessment - 12/21/16 1537    Subjective She half fell out of bed and required dtr assist to get up. But did not go to the floor. She has been sitting at desk 3x/day up to 45 min before shoulders hurt.  She brought video of office layout for PT to make recommendations on set-up.    Patient is accompained by: Family member   Limitations Sitting;Lifting;Standing;Walking;House hold activities   Patient Stated Goals sitting upright, walking, driving, cooking, cleaning, return to work, walk the dog    Currently in Pain? Yes   Pain Score 7    Pain Location Other (Comment)  generalized    Pain Orientation Right;Left   Pain Descriptors / Indicators Aching;Burning   Pain Type Chronic pain   Pain Onset More than a month ago   Pain Frequency Constant   Aggravating Factors  unknown   Pain Relieving Factors rest                         [facility] Adult PT Treatment/Exercise - 12/21/16 1911      Transfers   Transfers Sit to [employer]  to Sit   Sit to Stand 5: Supervision;With upper extremity assist;From chair/3-in-1  chair without armrests pushing with UEs   Sit to Stand Details Visual cues/gestures for sequencing;Verbal cues for technique   Stand to Sit 5: Supervision;With upper extremity assist;To chair/3-in-1  chairs without armrests   Stand to Sit Details (indicate cue type and reason) Verbal cues for technique;Visual cues/gestures for sequencing;Verbal cues for safe use of DME/AE     Ambulation/Gait   Ambulation/Gait Yes   Ambulation/Gait Assistance 5: Supervision   Ambulation/Gait Assistance Details verbal cues on upright posture, pelvic lateral weight shift limiting amount to right, LE motion in swing wihout steppage gait.    Ambulation Distance (Feet) 500 Feet  500' X 2   Assistive device Rollator   Gait Pattern Step-through pattern;Decreased stride length;Right  hip hike;Left hip hike;Right steppage;Left steppage;Ataxic;Trunk flexed;Lateral hip instability;Poor foot clearance - left;Poor foot clearance - right   Ambulation Surface Indoor;Level;Outdoor;Paved   Stairs Yes   Stairs Assistance 4: Min guard   Stair Management Technique Two rails;Alternating pattern;Forwards   Number of Stairs 4  3 reps   Ramp 4: Min assist  rollator Lipke   Ramp Details (indicate cue type and reason) verbal cues on posture and wt shift   Curb 4: Min assist  rollator H[place]baugh   Curb Details (indicate cue type and reason) verbal cues on technique and wt shift     Balance   Balance Assessed Yes     Dynamic Standing Balance   Reaching for objects comments: reaches 7" anteriorly and below knee within 6" of floor.   LUE support on rollator to reach with dominant UE   Head turns comments: looking over shoulder initially with cervical only and progressing to upper trunk rotation  contralateral UE support on rollator     Self-Care   Self-Care ADL's   ADL's PT watched pt video of office space which is set-up as L-shape with narrow opening to get behind desk. PT recommended moving dog bed & file cabinet to left of desk so desk can be moved against wall to create larger opening to enter /exit desk. Push rolling desk chair against wall at corner with desk L to stabilize for sit to/from stand.   pt verbalized understanding.      Neuro Re-ed    Neuro Re-ed Details  --     Knee/Hip Exercises: Standing   Lateral Step Up --   Lateral Step Up Limitations --                  PT Short Term Goals - 12/21/16 1834      PT SHORT TERM GOAL #1   Title Patient verbalizes and demonstrates understanding of updated HEP.    Baseline MET 12/21/16   Time 4   Period Weeks   Status Achieved     PT SHORT TERM GOAL #2   Title Patient reaches 7" anteriorly and to knee level standing balance with single UE support on AD or counter with supervision.    Baseline MET 12/21/16   Time  4   Period Weeks   Status Achieved     PT SHORT TERM GOAL #3   Title Patient sit to/from stand from chair without armrests using UEs to [place] with supervision.    Baseline MET 12/21/16   Time 4   Period Weeks   Status Achieved     PT SHORT TERM GOAL #4   Title Patient ambulates 500' with rollator [place] with supervision.  Baseline MET 12/21/16   Time 4   Period Weeks   Status Achieved     PT SHORT TERM GOAL #5   Title Patient negotiates ramps & curbs with rollator [place]es with minA.    Baseline MET 12/21/16   Time 4   Period Weeks   Status Achieved           PT Long Term Goals - 12/21/16 1836      PT LONG TERM GOAL #1   Title Patient will verbalize understanding and return demonstration for ongoing HEP.  (All LTGs Target Date: 01/20/17)   Time 8   Period Weeks   Status On-going   Target Date 01/20/17     PT LONG TERM GOAL #6   Title Patient will improvem FOTO score by >/= 10% to indicate improvement in self-perceived functional mobility. (TARGET DATE: 11/25/2016)    Time 8   Period Weeks   Status On-going   Target Date 01/20/17     PT LONG TERM GOAL #7   Title Patient ambulates 500' with LRAD modified independent.    Time 8   Period Weeks   Status On-going   Target Date 01/20/17     PT LONG TERM GOAL #8   Title Patient negotiates ramps, curbs & stairs with LRAD modified independent.    Time 8   Period Weeks   Status On-going   Target Date 01/20/17     PT LONG TERM GOAL  #9   TITLE Berg Balance >36/56    Time 8   Period Weeks   Status On-going   Target Date 01/20/17               Plan - 12/21/16 1839    Clinical Impression Statement Patient met all STGs today for this 30 day period. She ambulated with less assistance and improved coordination with rollator Mowrer. She verbalized understanding of office modification recommendations.    Rehab Potential Good   Clinical Impairments Affecting Rehab Potential HTN, cervical spondylosis, C6-7 L side  disc protrusion causing L foraminal impingement, anemia, functional neurological disorder, anemia     PT Frequency 2x / week   PT Duration Other (comment)   PT Treatment/Interventions ADLs/Self Care Home Management;Electrical Stimulation;Neuromuscular re-education;Balance training;Therapeutic exercise;Therapeutic activities;Functional mobility training;Stair training;Gait training;DME Instruction;Patient/family education;Energy conservation   PT Next Visit Plan Work towards  [place]. gait outdoors with rollator Fle[place]. neuromuscular activities to improve standing balance & control.    Consulted and Agree with Plan of Care Patient;Family member/caregiver   Family Member Consulted dtr      Patient will benefit from skilled therapeutic intervention in order to improve the following deficits and impairments:  Decreased activity tolerance, Decreased balance, Decreased mobility, Decreased knowledge of use of DME, Decreased endurance, Decreased strength, Impaired flexibility, Impaired tone  Visit Diagnosis: Other symptoms and signs involving the nervous system  Unsteadiness on feet  Other abnormalities of gait and mobility  Muscle weakness (generalized)  Ataxic gait     Problem List Patient Active Problem List   Diagnosis Date Noted  . Abnormal laboratory test 09/09/2016  . Gait abnormality 08/03/2016  . Weakness 08/03/2016  . Paresthesia 08/03/2016  . S/P cervical spinal fusion 10/23/2015  . Hypersomnia 12/08/2010  . 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  . EPIGASTRIC PAIN 10/13/2009  . IRRITABLE BOWEL SYNDROME 03/10/2009  . BACK PAIN, LUMBAR 07/01/2008  . COSTOCHONDRITIS 02/01/2008  . VIRAL URI 01/08/2008  . CHEST PAIN 01/08/2008  . 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 ARM PAIN  12/25/2007  . ANEMIA-NOS 10/02/2007  . Essential hypertension 10/02/2007  . GERD 10/02/2007  . POLYP, GALLBLADDER 10/02/2007  . UTERINE POLYP 10/02/2007  . OSTEOARTHRITIS 10/02/2007  .  Dyskinesia 10/02/2007  . HEADACHE 10/02/2007  . PALPITATIONS 10/02/2007  . CARDIAC MURMUR, HX OF 10/02/2007  . NEPHROLITHIASIS, HX OF 10/02/2007    Jamey Reas PT, DPT 12/22/2016, 2:43 PM  [facility] [address] [place], [place], [address] Phone: [phone]   Fax:  [phone]  Name: Amy Barry MRN: [id number] Date of Birth: 1970/08/10

## 2016-12-26 ENCOUNTER — Ambulatory Visit: Payer: 59 | Admitting: Physical Therapy

## 2016-12-26 ENCOUNTER — Encounter: Payer: Self-pay | Admitting: Physical Therapy

## 2016-12-26 DIAGNOSIS — M6281 Muscle weakness (generalized): Secondary | ICD-10-CM

## 2016-12-26 DIAGNOSIS — R29818 Other symptoms and signs involving the nervous system: Secondary | ICD-10-CM | POA: Diagnosis not present

## 2016-12-26 DIAGNOSIS — R260 Ataxic gait: Secondary | ICD-10-CM

## 2016-12-26 DIAGNOSIS — R2689 Other abnormalities of gait and mobility: Secondary | ICD-10-CM

## 2016-12-26 DIAGNOSIS — R2681 Unsteadiness on feet: Secondary | ICD-10-CM

## 2016-12-26 NOTE — Therapy (Signed)
[facility] [address] [place], [place], [address] Phone: [phone]   Fax:  [phone]  Physical Therapy Treatment  Patient Details  Name: Amy Barry MRN: [id number] Date of Birth: October 29, 1970 Referring Provider: Donald Prose MD   Encounter Date: 12/26/2016      PT End of Session - 12/26/16 2300    Visit Number 26   Number of Visits 35   Date for PT Re-Evaluation 01/20/17   Authorization Type [employer]    Authorization - Visit Number 26   Authorization - Number of Visits 60   PT Start Time 9450   PT Stop Time 1614   PT Time Calculation (min) 44 min   Equipment Utilized During Treatment Gait belt   Activity Tolerance Patient tolerated treatment well   Behavior During Therapy WFL for tasks assessed/performed      Past Medical History:  Diagnosis Date  . Anemia   . Dysrhythmia    pvc's holter monitor started lopressor- no return visit  . GERD (gastroesophageal reflux disease)   . Headache(784.0)   . Heart murmur    child no echo  . History of kidney stones   . Hypertension   . IBS (irritable bowel syndrome)   . Low vitamin D level   . Lower extremity weakness   . Neuromuscular disorder ([place])   . Numbness and tingling   . Osteoarthritis   . Pneumonia    January- tx with IV antibiotics  for pneumonia in 04/2015   . PVC (premature ventricular contraction)   . Seizure disorder ([place])    denied seizures- has dyskinesia  . Sleep concern 2013   toldf that she doesn 't have apnea but she stays sleeping a lot  . Upper extremity weakness     Past Surgical History:  Procedure Laterality Date  . 2 ovarian cysts     age 15  . CERVICAL DISC ARTHROPLASTY N/A 10/23/2015   Procedure: Cervical Disc Arthroplasty Cervical six-seven;  Surgeon: Eustace Moore, MD;  Location: [facility];  Service: Neurosurgery;  Laterality: N/A;  . ESOPHAGOGASTRODUODENOSCOPY     for GERD  . LAPAROSCOPIC ABDOMINAL  EXPLORATION     dx ibs  . TUBAL LIGATION      There were no vitals filed for this visit.      Subjective Assessment - 12/26/16 1537    Subjective She used rollator to walk into church without issues. She rearranged office like PT recommended and it helped.    Limitations Sitting;Lifting;Standing;Walking;House hold activities   How long can you sit comfortably? 2 hrs prior to onset of pain    Patient Stated Goals sitting upright, walking, driving, cooking, cleaning, return to work, walk the dog    Currently in Pain? Yes   Pain Score 5    Pain Location Generalized   Pain Orientation Right;Left   Pain Descriptors / Indicators Aching;Burning   Pain Type Chronic pain   Pain Onset More than a month ago   Pain Frequency Constant   Aggravating Factors  unknown   Pain Relieving Factors rest                         [facility] Adult PT Treatment/Exercise - 12/26/16 1530      Transfers   Transfers Sit to Stand;Stand to Sit   Sit to Stand 5: Supervision;With upper extremity assist;From chair/3-in-1  chair without armrests pushing with UEs   Sit to Stand Details  Visual cues/gestures for sequencing;Verbal cues for technique   Stand to Sit 5: Supervision;With upper extremity assist;To chair/3-in-1  chairs without armrests   Stand to Sit Details (indicate cue type and reason) Verbal cues for technique;Visual cues/gestures for sequencing;Verbal cues for safe use of DME/AE     Ambulation/Gait   Ambulation/Gait Yes   Ambulation/Gait Assistance 5: Supervision   Ambulation/Gait Assistance Details verbal cues on upright posture, LE control and wt shift   Ambulation Distance (Feet) 500 Feet  500' X 2   Assistive device Rollator   Gait Pattern Step-through pattern;Decreased stride length;Right hip hike;Left hip hike;Right steppage;Left steppage;Ataxic;Trunk flexed;Lateral hip instability;Poor foot clearance - left;Poor foot clearance - right   Ambulation Surface  Indoor;Level;Outdoor;Paved     High Level Balance   High Level Balance Activities Side stepping;Braiding;Turns   High Level Balance Comments At counter iwth UE support: demo, verbal & tactile cues for weight shift, control & posture for motions laterally and turning 90* to change directions.      Self-Care   Self-Care ADL's   ADL's Pt shared video of changes to home office that opened office and enables improved access.                   PT Short Term Goals - 12/21/16 1834      PT SHORT TERM GOAL #1   Title Patient verbalizes and demonstrates understanding of updated HEP.    Baseline MET 12/21/16   Time 4   Period Weeks   Status Achieved     PT SHORT TERM GOAL #2   Title Patient reaches 7" anteriorly and to knee level standing balance with single UE support on AD or counter with supervision.    Baseline MET 12/21/16   Time 4   Period Weeks   Status Achieved     PT SHORT TERM GOAL #3   Title Patient sit to/from stand from chair without armrests using UEs to [place] with supervision.    Baseline MET 12/21/16   Time 4   Period Weeks   Status Achieved     PT SHORT TERM GOAL #4   Title Patient ambulates 500' with rollator [place]man with supervision.    Baseline MET 12/21/16   Time 4   Period Weeks   Status Achieved     PT SHORT TERM GOAL #5   Title Patient negotiates ramps & curbs with rollator [place]ikes with minA.    Baseline MET 12/21/16   Time 4   Period Weeks   Status Achieved           PT Long Term Goals - 12/21/16 1836      PT LONG TERM GOAL #1   Title Patient will verbalize understanding and return demonstration for ongoing HEP.  (All LTGs Target Date: 01/20/17)   Time 8   Period Weeks   Status On-going   Target Date 01/20/17     PT LONG TERM GOAL #6   Title Patient will improvem FOTO score by >/= 10% to indicate improvement in self-perceived functional mobility. (TARGET DATE: 11/25/2016)    Time 8   Period Weeks   Status On-going   Target Date  01/20/17     PT LONG TERM GOAL #7   Title Patient ambulates 500' with LRAD modified independent.    Time 8   Period Weeks   Status On-going   Target Date 01/20/17     PT LONG TERM GOAL #8   Title Patient negotiates ramps, curbs & stairs  with LRAD modified independent.    Time 8   Period Weeks   Status On-going   Target Date 01/20/17     PT LONG TERM GOAL  #9   TITLE Berg Balance >36/56    Time 8   Period Weeks   Status On-going   Target Date 01/20/17               Plan - 12/26/16 2302    Clinical Impression Statement Patient improved ability to side step, crossovers and change directions 90* with skilled instructions. Patient's gait improved with skilled instruction in technique.    Rehab Potential Good   Clinical Impairments Affecting Rehab Potential HTN, cervical spondylosis, C6-7 L side disc protrusion causing L foraminal impingement, anemia, functional neurological disorder, anemia     PT Frequency 2x / week   PT Duration Other (comment)   PT Treatment/Interventions ADLs/Self Care Home Management;Electrical Stimulation;Neuromuscular re-education;Balance training;Therapeutic exercise;Therapeutic activities;Functional mobility training;Stair training;Gait training;DME Instruction;Patient/family education;Energy conservation   PT Next Visit Plan Work towards [place]. gait outdoors with rollator [place]. neuromuscular activities to improve standing balance & control.    Consulted and Agree with Plan of Care Patient      Patient will benefit from skilled therapeutic intervention in order to improve the following deficits and impairments:  Decreased activity tolerance, Decreased balance, Decreased mobility, Decreased knowledge of use of DME, Decreased endurance, Decreased strength, Impaired flexibility, Impaired tone  Visit Diagnosis: Other symptoms and signs involving the nervous system  Unsteadiness on feet  Other abnormalities of gait and mobility  Muscle weakness  (generalized)  Ataxic gait     Problem List Patient Active Problem List   Diagnosis Date Noted  . Abnormal laboratory test 09/09/2016  . Gait abnormality 08/03/2016  . Weakness 08/03/2016  . Paresthesia 08/03/2016  . S/P cervical spinal fusion 10/23/2015  . Hypersomnia 12/08/2010  . 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  . EPIGASTRIC PAIN 10/13/2009  . IRRITABLE BOWEL SYNDROME 03/10/2009  . BACK PAIN, LUMBAR 07/01/2008  . COSTOCHONDRITIS 02/01/2008  . VIRAL URI 01/08/2008  . CHEST PAIN 01/08/2008  . 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 ARM PAIN 12/25/2007  . ANEMIA-NOS 10/02/2007  . Essential hypertension 10/02/2007  . GERD 10/02/2007  . POLYP, GALLBLADDER 10/02/2007  . UTERINE POLYP 10/02/2007  . OSTEOARTHRITIS 10/02/2007  . Dyskinesia 10/02/2007  . HEADACHE 10/02/2007  . PALPITATIONS 10/02/2007  . CARDIAC MURMUR, HX OF 10/02/2007  . NEPHROLITHIASIS, HX OF 10/02/2007    Jamey Reas PT, DPT 12/26/2016, 11:05 PM  [facility] [address] [place], [place], [address] Phone: [phone]   Fax:  [phone]  Name: Amy Barry MRN: [id number] Date of Birth: 21-Feb-1971

## 2016-12-28 ENCOUNTER — Encounter: Payer: Self-pay | Admitting: Physical Therapy

## 2016-12-28 ENCOUNTER — Ambulatory Visit: Payer: 59 | Admitting: Physical Therapy

## 2016-12-28 DIAGNOSIS — R260 Ataxic gait: Secondary | ICD-10-CM

## 2016-12-28 DIAGNOSIS — M6281 Muscle weakness (generalized): Secondary | ICD-10-CM

## 2016-12-28 DIAGNOSIS — R2681 Unsteadiness on feet: Secondary | ICD-10-CM

## 2016-12-28 DIAGNOSIS — R29818 Other symptoms and signs involving the nervous system: Secondary | ICD-10-CM

## 2016-12-28 DIAGNOSIS — R2689 Other abnormalities of gait and mobility: Secondary | ICD-10-CM

## 2016-12-28 NOTE — Therapy (Signed)
[facility] [address] [place], [place], [address] Phone: [phone]   Fax:  [phone]  Physical Therapy Treatment  Patient Details  Name: Amy Barry MRN: [id number] Date of Birth: 16-Feb-1971 Referring Provider: Donald Prose MD   Encounter Date: 12/28/2016      PT End of Session - 12/28/16 2258    Visit Number 27   Number of Visits 35   Date for PT Re-Evaluation 01/20/17   Authorization Type [employer]    Authorization - Visit Number 27   Authorization - Number of Visits 60   PT Start Time 1655   PT Stop Time 1620   PT Time Calculation (min) 50 min   Equipment Utilized During Treatment Gait belt   Activity Tolerance Patient tolerated treatment well;Patient limited by fatigue;Patient limited by pain   Behavior During Therapy WFL for tasks assessed/performed      Past Medical History:  Diagnosis Date  . Anemia   . Dysrhythmia    pvc's holter monitor started lopressor- no return visit  . GERD (gastroesophageal reflux disease)   . Headache(784.0)   . Heart murmur    child no echo  . History of kidney stones   . Hypertension   . IBS (irritable bowel syndrome)   . Low vitamin D level   . Lower extremity weakness   . Neuromuscular disorder ([place])   . Numbness and tingling   . Osteoarthritis   . Pneumonia    January- tx with IV antibiotics  for pneumonia in 04/2015   . PVC (premature ventricular contraction)   . Seizure disorder ([place])    denied seizures- has dyskinesia  . Sleep concern 2013   toldf that she doesn 't have apnea but she stays sleeping a lot  . Upper extremity weakness     Past Surgical History:  Procedure Laterality Date  . 2 ovarian cysts     age 22  . CERVICAL DISC ARTHROPLASTY N/A 10/23/2015   Procedure: Cervical Disc Arthroplasty Cervical six-seven;  Surgeon: Eustace Moore, MD;  Location: [facility];  Service: Neurosurgery;  Laterality: N/A;  . ESOPHAGOGASTRODUODENOSCOPY      for GERD  . LAPAROSCOPIC ABDOMINAL EXPLORATION     dx ibs  . TUBAL LIGATION      There were no vitals filed for this visit.      Subjective Assessment - 12/28/16 1539    Subjective No falls. 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 Patient reports her neck began to hurt after sitting in church in chair without armrests leaning forward onto rollator [place].    Limitations Sitting;Lifting;Standing;Walking;House hold activities   How long can you sit comfortably? 3 hrs prior to onset of pain    Patient Stated Goals sitting upright, walking, driving, cooking, cleaning, return to work, walk the dog    Currently in Pain? Yes   Pain Score 5    Pain Location Generalized   Pain Orientation Left;Right   Pain Descriptors / Indicators Aching;Burning   Pain Type Chronic pain   Pain Onset More than a month ago   Pain Frequency Constant   Aggravating Factors  unknown   Pain Relieving Factors rest   Multiple Pain Sites Yes   Pain Score 6   Pain Location Neck   Pain Orientation Right   Pain Descriptors / Indicators Tightness   Pain Type Acute pain   Pain Onset In the past 7 days  Pain Frequency Constant   Aggravating Factors  right rotation     Manual Therapy: Palpation with tightness in posterior right paraspinals and thoracic / scapula trigger points & muscle tightness.  Manual cervical traction with cervical extension & rotation. 1st-4th rib Grade 1 mobilizations. Soft tissue massage along paraspinals C1 to T5 and scapula proximal muscles.  Neuromuscular Re-education  PT demo, instructed in proper sitting posture seated on rollator seat which has armrests.  Pt has shoulder elevation, trunk flexion with increases cervical & upper thoracic flexion with sitting without UE support & feet not supported like at church.  Pt has plans to attend 3 hr long concert at church this weekend. PT instructed in proper posture with option of portable athletic chairs with  armrests as better option than chairs described at [facility] [employer].  Pt return demo & verbalized understanding.   Gait Training: PT demo, instructed in 4 point gait pattern using bilateral straight canes. Patient ambulated 100' with 2 canes with 2 person maxA with manual & verbal cues on sequence, cane position, upright posture and trunk control.  Pt ambulated with rollator [place] 100' into PT gym & 100' at end of session to lobby with supervision.                             PT Education - 12/28/16 1530    Education provided Yes   Education Details sitting posture with armrests to support UE weight & foot supported on 4" block to minimize lordosis & slumping   Person(s) Educated Patient   Methods Explanation;Demonstration   Comprehension Verbalized understanding          PT Short Term Goals - 12/21/16 1834      PT SHORT TERM GOAL #1   Title Patient verbalizes and demonstrates understanding of updated HEP.    Baseline MET 12/21/16   Time 4   Period Weeks   Status Achieved     PT SHORT TERM GOAL #2   Title Patient reaches 7" anteriorly and to knee level standing balance with single UE support on AD or counter with supervision.    Baseline MET 12/21/16   Time 4   Period Weeks   Status Achieved     PT SHORT TERM GOAL #3   Title Patient sit to/from stand from chair without armrests using UEs to [place] with supervision.    Baseline MET 12/21/16   Time 4   Period Weeks   Status Achieved     PT SHORT TERM GOAL #4   Title Patient ambulates 500' with rollator [place] with supervision.    Baseline MET 12/21/16   Time 4   Period Weeks   Status Achieved     PT SHORT TERM GOAL #5   Title Patient negotiates ramps & curbs with rollator [place] with minA.    Baseline MET 12/21/16   Time 4   Period Weeks   Status Achieved           PT Long Term Goals - 12/21/16 1836      PT LONG TERM GOAL #1   Title Patient will verbalize understanding and return  demonstration for ongoing HEP.  (All LTGs Target Date: 01/20/17)   Time 8   Period Weeks   Status On-going   Target Date 01/20/17     PT LONG TERM GOAL #6   Title Patient will improvem FOTO score by >/= 10% to indicate improvement in self-perceived functional  mobility. (TARGET DATE: 11/25/2016)    Time 8   Period Weeks   Status On-going   Target Date 01/20/17     PT LONG TERM GOAL #7   Title Patient ambulates 500' with LRAD modified independent.    Time 8   Period Weeks   Status On-going   Target Date 01/20/17     PT LONG TERM GOAL #8   Title Patient negotiates ramps, curbs & stairs with LRAD modified independent.    Time 8   Period Weeks   Status On-going   Target Date 01/20/17     PT LONG TERM GOAL  #9   TITLE Berg Balance >36/56    Time 8   Period Weeks   Status On-going   Target Date 01/20/17             Patient will benefit from skilled therapeutic intervention in order to improve the following deficits and impairments:     Visit Diagnosis: Other symptoms and signs involving the nervous system  Unsteadiness on feet  Other abnormalities of gait and mobility  Muscle weakness (generalized)  Ataxic gait     Problem List Patient Active Problem List   Diagnosis Date Noted  . Abnormal laboratory test 09/09/2016  . Gait abnormality 08/03/2016  . Weakness 08/03/2016  . Paresthesia 08/03/2016  . S/P cervical spinal fusion 10/23/2015  . Hypersomnia 12/08/2010  . 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  . EPIGASTRIC PAIN 10/13/2009  . IRRITABLE BOWEL SYNDROME 03/10/2009  . BACK PAIN, LUMBAR 07/01/2008  . COSTOCHONDRITIS 02/01/2008  . VIRAL URI 01/08/2008  . CHEST PAIN 01/08/2008  . 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 ARM PAIN 12/25/2007  . ANEMIA-NOS 10/02/2007  . Essential hypertension 10/02/2007  . GERD 10/02/2007  . POLYP, GALLBLADDER 10/02/2007  . UTERINE POLYP 10/02/2007  . OSTEOARTHRITIS 10/02/2007  . Dyskinesia 10/02/2007  .  HEADACHE 10/02/2007  . PALPITATIONS 10/02/2007  . CARDIAC MURMUR, HX OF 10/02/2007  . NEPHROLITHIASIS, HX OF 10/02/2007    Jamey Reas PT, DPT 12/28/2016, 11:22 PM  [facility] [address] [place], [place], [address] Phone: [phone]   Fax:  [phone]  Name: Amy Barry MRN: [id number] Date of Birth: Mar 06, 1971

## 2016-12-30 DIAGNOSIS — R531 Weakness: Secondary | ICD-10-CM | POA: Diagnosis not present

## 2016-12-30 DIAGNOSIS — I10 Essential (primary) hypertension: Secondary | ICD-10-CM | POA: Diagnosis not present

## 2017-01-02 ENCOUNTER — Ambulatory Visit: Payer: 59 | Attending: Family Medicine | Admitting: Physical Therapy

## 2017-01-02 ENCOUNTER — Encounter: Payer: Self-pay | Admitting: Physical Therapy

## 2017-01-02 VITALS — BP 124/82 | HR 86

## 2017-01-02 DIAGNOSIS — R279 Unspecified lack of coordination: Secondary | ICD-10-CM | POA: Diagnosis present

## 2017-01-02 DIAGNOSIS — Z9181 History of falling: Secondary | ICD-10-CM | POA: Insufficient documentation

## 2017-01-02 DIAGNOSIS — M6281 Muscle weakness (generalized): Secondary | ICD-10-CM

## 2017-01-02 DIAGNOSIS — R2689 Other abnormalities of gait and mobility: Secondary | ICD-10-CM | POA: Insufficient documentation

## 2017-01-02 DIAGNOSIS — R29818 Other symptoms and signs involving the nervous system: Secondary | ICD-10-CM | POA: Diagnosis not present

## 2017-01-02 DIAGNOSIS — R2681 Unsteadiness on feet: Secondary | ICD-10-CM | POA: Diagnosis not present

## 2017-01-02 DIAGNOSIS — R260 Ataxic gait: Secondary | ICD-10-CM

## 2017-01-02 NOTE — Therapy (Signed)
[facility] [address] [place], [place], [address] Phone: [phone]   Fax:  [phone]  Physical Therapy Treatment  Patient Details  Name: Amy Barry MRN: [id number] Date of Birth: [birth year] Referring Provider: Donald Prose MD   Encounter Date: 01/02/2017      PT End of Session - 01/02/17 2058    Visit Number 28   Number of Visits 35   Date for PT Re-Evaluation 01/20/17   Authorization Type [employer]    Authorization - Visit Number 28   Authorization - Number of Visits 60   PT Start Time 9937   PT Stop Time 1615   PT Time Calculation (min) 40 min   Equipment Utilized During Treatment Gait belt   Activity Tolerance Patient tolerated treatment well;Patient limited by fatigue;Patient limited by pain   Behavior During Therapy WFL for tasks assessed/performed      Past Medical History:  Diagnosis Date  . Anemia   . Dysrhythmia    pvc's holter monitor started lopressor- no return visit  . GERD (gastroesophageal reflux disease)   . Headache(784.0)   . Heart murmur    child no echo  . History of kidney stones   . Hypertension   . IBS (irritable bowel syndrome)   . Low vitamin D level   . Lower extremity weakness   . Neuromuscular disorder ([place])   . Numbness and tingling   . Osteoarthritis   . Pneumonia    January- tx with IV antibiotics  for pneumonia in 04/2015   . PVC (premature ventricular contraction)   . Seizure disorder ([place])    denied seizures- has dyskinesia  . Sleep concern 2013   toldf that she doesn 't have apnea but she stays sleeping a lot  . Upper extremity weakness     Past Surgical History:  Procedure Laterality Date  . 2 ovarian cysts     age 47  . CERVICAL DISC ARTHROPLASTY N/A 10/23/2015   Procedure: Cervical Disc Arthroplasty Cervical six-seven;  Surgeon: Eustace Moore, MD;  Location: [facility];  Service: Neurosurgery;  Laterality: N/A;  . ESOPHAGOGASTRODUODENOSCOPY      for GERD  . LAPAROSCOPIC ABDOMINAL EXPLORATION     dx ibs  . TUBAL LIGATION      Vitals:   01/02/17 1540  BP: 124/82  Pulse: 86        Subjective Assessment - 01/02/17 1537    Subjective No falls. She saw neurologist, Dr. Ermalene Postin, who says spinal tap is clear. He feels continuing PT is best course. He had her stop BP med on Saturday.    Limitations Sitting;Lifting;Standing;Walking;House hold activities   How long can you sit comfortably? 3 hrs prior to onset of pain    Patient Stated Goals sitting upright, walking, driving, cooking, cleaning, return to work, walk the dog    Currently in Pain? Yes   Pain Score 5    Pain Location Generalized   Pain Orientation Right;Left   Pain Descriptors / Indicators Aching;Burning   Pain Type Chronic pain   Pain Onset More than a month ago   Pain Frequency Constant   Aggravating Factors  unknown   Pain Relieving Factors rest                         [facility] Adult PT Treatment/Exercise - 01/02/17 1535      Transfers   Transfers Sit to Stand;Stand to [employer]  to Stand 5: Supervision;With upper extremity assist;From chair/3-in-1  chair without armrests pushing with UEs   Sit to Stand Details Visual cues/gestures for sequencing;Verbal cues for technique   Stand to Sit 5: Supervision;With upper extremity assist;To chair/3-in-1  chairs without armrests   Stand to Sit Details (indicate cue type and reason) Verbal cues for technique;Visual cues/gestures for sequencing;Verbal cues for safe use of DME/AE     Ambulation/Gait   Ambulation/Gait Yes   Ambulation/Gait Assistance 5: Supervision   Ambulation/Gait Assistance Details verbal cues on upright posture, LE coordination and weight shift.    Ambulation Distance (Feet) 500 Feet  500'   Assistive device Rollator   Gait Pattern Step-through pattern;Decreased stride length;Right hip hike;Left hip hike;Right steppage;Left steppage;Ataxic;Trunk flexed;Lateral hip instability;Poor  foot clearance - left;Poor foot clearance - right   Ambulation Surface Indoor;Level;Outdoor;Paved     High Level Balance   High Level Balance Activities Side stepping;Backward walking;Direction changes   High Level Balance Comments At counter with UE support: demo, verbal & tactile cues for weight shift, control & posture for motions laterally, backward and turning 90* to change directions.      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 If tolerates increase to 4 reps.  Change position from sitting to standing every 20-30 minutes to stretch. Pt verbalized understanding.    Other Therapeutic Activities Sitting on stool propelling with BLEs with tactile & verbal cues on LE movement & engaging hamstrings.                   PT Short Term Goals - 12/21/16 1834      PT SHORT TERM GOAL #1   Title Patient verbalizes and demonstrates understanding of updated HEP.    Baseline MET 12/21/16   Time 4   Period Weeks   Status Achieved     PT SHORT TERM GOAL #2   Title Patient reaches 7" anteriorly and to knee level standing balance with single UE support on AD or counter with supervision.    Baseline MET 12/21/16   Time 4   Period Weeks   Status Achieved     PT SHORT TERM GOAL #3   Title Patient sit to/from stand from chair without armrests using UEs to [place] with supervision.    Baseline MET 12/21/16   Time 4   Period Weeks   Status Achieved     PT SHORT TERM GOAL #4   Title Patient ambulates 500' with rollator [place] with supervision.    Baseline MET 12/21/16   Time 4   Period Weeks   Status Achieved     PT SHORT TERM GOAL #5   Title Patient negotiates ramps & curbs with rollator [place] with minA.    Baseline MET 12/21/16   Time 4   Period Weeks   Status Achieved           PT Long Term Goals - 12/21/16 1836      PT  LONG TERM GOAL #1   Title Patient will verbalize understanding and return demonstration for ongoing HEP.  (All LTGs Target Date: 01/20/17)   Time 8   Period Weeks   Status On-going   Target Date 01/20/17     PT LONG TERM GOAL #6   Title Patient will improvem FOTO score by >/=  10% to indicate improvement in self-perceived functional mobility. (TARGET DATE: 11/25/2016)    Time 8   Period Weeks   Status On-going   Target Date 01/20/17     PT LONG TERM GOAL #7   Title Patient ambulates 500' with LRAD modified independent.    Time 8   Period Weeks   Status On-going   Target Date 01/20/17     PT LONG TERM GOAL #8   Title Patient negotiates ramps, curbs & stairs with LRAD modified independent.    Time 8   Period Weeks   Status On-going   Target Date 01/20/17     PT LONG TERM GOAL  #9   TITLE Berg Balance >36/56    Time 8   Period Weeks   Status On-going   Target Date 01/20/17               Plan - 01/02/17 2058    Clinical Impression Statement Patient appears will need additional PT after current certification period / plan of care ends in 2 weeks to maximize her function. She continues to improve but will need additional care to progress. Patient needs skilled care for movements in different directions (laterally, turning 90*, backwards).    Rehab Potential Good   Clinical Impairments Affecting Rehab Potential HTN, cervical spondylosis, C6-7 L side disc protrusion causing L foraminal impingement, anemia, functional neurological disorder, anemia     PT Frequency 2x / week   PT Duration Other (comment)   PT Treatment/Interventions ADLs/Self Care Home Management;Electrical Stimulation;Neuromuscular re-education;Balance training;Therapeutic exercise;Therapeutic activities;Functional mobility training;Stair training;Gait training;DME Instruction;Patient/family education;Energy conservation   PT Next Visit Plan gait outdoors with rollator [place]. neuromuscular activities to  improve standing balance & control.    Consulted and Agree with Plan of Care Patient      Patient will benefit from skilled therapeutic intervention in order to improve the following deficits and impairments:  Decreased activity tolerance, Decreased balance, Decreased mobility, Decreased knowledge of use of DME, Decreased endurance, Decreased strength, Impaired flexibility, Impaired tone  Visit Diagnosis: Other symptoms and signs involving the nervous system  Unsteadiness on feet  Other abnormalities of gait and mobility  Muscle weakness (generalized)  Ataxic gait     Problem List Patient Active Problem List   Diagnosis Date Noted  . Abnormal laboratory test 09/09/2016  . Gait abnormality 08/03/2016  . Weakness 08/03/2016  . Paresthesia 08/03/2016  . S/P cervical spinal fusion 10/23/2015  . Hypersomnia 12/08/2010  . 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  . EPIGASTRIC PAIN 10/13/2009  . IRRITABLE BOWEL SYNDROME 03/10/2009  . BACK PAIN, LUMBAR 07/01/2008  . COSTOCHONDRITIS 02/01/2008  . VIRAL URI 01/08/2008  . CHEST PAIN 01/08/2008  . 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 ARM PAIN 12/25/2007  . ANEMIA-NOS 10/02/2007  . Essential hypertension 10/02/2007  . GERD 10/02/2007  . POLYP, GALLBLADDER 10/02/2007  . UTERINE POLYP 10/02/2007  . OSTEOARTHRITIS 10/02/2007  . Dyskinesia 10/02/2007  . HEADACHE 10/02/2007  . PALPITATIONS 10/02/2007  . CARDIAC MURMUR, HX OF 10/02/2007  . NEPHROLITHIASIS, HX OF 10/02/2007    Jamey Reas PT, DPT 01/02/2017, 9:02 PM  [facility] [address] [place], [place], [address] Phone: [phone]   Fax:  [phone]  Name: Amy Barry MRN: [id number] Date of Birth: [birth year]

## 2017-01-04 ENCOUNTER — Encounter: Payer: Self-pay | Admitting: Physical Therapy

## 2017-01-04 ENCOUNTER — Ambulatory Visit: Payer: 59 | Admitting: Physical Therapy

## 2017-01-04 DIAGNOSIS — R29818 Other symptoms and signs involving the nervous system: Secondary | ICD-10-CM

## 2017-01-04 DIAGNOSIS — R2681 Unsteadiness on feet: Secondary | ICD-10-CM

## 2017-01-04 DIAGNOSIS — R260 Ataxic gait: Secondary | ICD-10-CM

## 2017-01-04 DIAGNOSIS — R2689 Other abnormalities of gait and mobility: Secondary | ICD-10-CM

## 2017-01-04 DIAGNOSIS — M6281 Muscle weakness (generalized): Secondary | ICD-10-CM

## 2017-01-05 NOTE — Therapy (Signed)
[facility] [address] [place], [place], [address] Phone: [phone]   Fax:  [phone]  Physical Therapy Treatment  Patient Details  Name: Amy Barry MRN: [id number] Date of Birth: 1970/09/21 Referring Provider: Donald Prose MD   Encounter Date: 01/04/2017      PT End of Session - 01/04/17 1749    Visit Number 29   Number of Visits 35   Date for PT Re-Evaluation 01/20/17   Authorization Type [employer]    Authorization - Visit Number 29   Authorization - Number of Visits 60   PT Start Time 7858   PT Stop Time 1617   PT Time Calculation (min) 42 min   Equipment Utilized During Treatment Gait belt   Activity Tolerance Patient tolerated treatment well;Patient limited by fatigue;Patient limited by pain   Behavior During Therapy WFL for tasks assessed/performed      Past Medical History:  Diagnosis Date  . Anemia   . Dysrhythmia    pvc's holter monitor started lopressor- no return visit  . GERD (gastroesophageal reflux disease)   . Headache(784.0)   . Heart murmur    child no echo  . History of kidney stones   . Hypertension   . IBS (irritable bowel syndrome)   . Low vitamin D level   . Lower extremity weakness   . Neuromuscular disorder ([place])   . Numbness and tingling   . Osteoarthritis   . Pneumonia    January- tx with IV antibiotics  for pneumonia in 04/2015   . PVC (premature ventricular contraction)   . Seizure disorder ([place])    denied seizures- has dyskinesia  . Sleep concern 2013   toldf that she doesn 't have apnea but she stays sleeping a lot  . Upper extremity weakness     Past Surgical History:  Procedure Laterality Date  . 2 ovarian cysts     age 60  . CERVICAL DISC ARTHROPLASTY N/A 10/23/2015   Procedure: Cervical Disc Arthroplasty Cervical six-seven;  Surgeon: Eustace Moore, MD;  Location: [facility];  Service: Neurosurgery;  Laterality: N/A;  . ESOPHAGOGASTRODUODENOSCOPY      for GERD  . LAPAROSCOPIC ABDOMINAL EXPLORATION     dx ibs  . TUBAL LIGATION      There were no vitals filed for this visit.      Subjective Assessment - 01/04/17 1534    Subjective She sat at her desk for 2hrs 3 sets for 2 days. She has some upper back / shoulder tenderness (10/10 for 2 hrs after first day, 2/10 after second day).    Limitations Sitting;Lifting;Standing;Walking;House hold activities   How long can you sit comfortably? 3 hrs prior to onset of pain    Patient Stated Goals sitting upright, walking, driving, cooking, cleaning, return to work, walk the dog    Currently in Pain? Yes   Pain Score 4    Pain Location Generalized   Pain Orientation Right;Left   Pain Descriptors / Indicators Aching;Burning   Pain Type Chronic pain   Pain Onset More than a month ago   Pain Frequency Constant   Aggravating Factors  unknown   Pain Relieving Factors warm shower     Gait Training: Pt ambulated from lobby to gym 43' with rollator Garfield. Pt able to carry on conversation without constant focus on gait with supervision /  intermittent cues on posture and LE movement pattern (not hip hiking). Pt ambulated 120' with +2 hand hold  assist with manual/tactile, verbal & visual cues on upright posture/ core control and LE movements.  End of session: Pt ambulated 500' with rollator Vandam with verbal cues on posture, core stability & wt shift.   Neuromuscular Re-ed:  Swiss ball with PT posterior manual /tactile assist, mirror anterior for visual feedback, verbal cues on control, chairbacks laterally:  BUE support rolling pelvis ant/post, laterally, circles; Single UE support reaching forward at shoulder ht; UEs on her thighs: alternate ankle DF                              PT Education - 01/04/17 1534    Education provided Yes   Education Details increase simulated work at desk to [employer] 4 times/day with 1 hr break between 2hr periods & standing stretch q30  minutes.    Person(s) Educated Patient   Methods Explanation;Verbal cues   Comprehension Verbalized understanding;Verbal cues required;Need further instruction          PT Short Term Goals - 12/21/16 1834      PT SHORT TERM GOAL #1   Title Patient verbalizes and demonstrates understanding of updated HEP.    Baseline MET 12/21/16   Time 4   Period Weeks   Status Achieved     PT SHORT TERM GOAL #2   Title Patient reaches 7" anteriorly and to knee level standing balance with single UE support on AD or counter with supervision.    Baseline MET 12/21/16   Time 4   Period Weeks   Status Achieved     PT SHORT TERM GOAL #3   Title Patient sit to/from stand from chair without armrests using UEs to Chronis with supervision.    Baseline MET 12/21/16   Time 4   Period Weeks   Status Achieved     PT SHORT TERM GOAL #4   Title Patient ambulates 500' with rollator Paternostro with supervision.    Baseline MET 12/21/16   Time 4   Period Weeks   Status Achieved     PT SHORT TERM GOAL #5   Title Patient negotiates ramps & curbs with rollator Rainford with minA.    Baseline MET 12/21/16   Time 4   Period Weeks   Status Achieved           PT Long Term Goals - 12/21/16 1836      PT LONG TERM GOAL #1   Title Patient will verbalize understanding and return demonstration for ongoing HEP.  (All LTGs Target Date: 01/20/17)   Time 8   Period Weeks   Status On-going   Target Date 01/20/17     PT LONG TERM GOAL #6   Title Patient will improvem FOTO score by >/= 10% to indicate improvement in self-perceived functional mobility. (TARGET DATE: 11/25/2016)    Time 8   Period Weeks   Status On-going   Target Date 01/20/17     PT LONG TERM GOAL #7   Title Patient ambulates 500' with LRAD modified independent.    Time 8   Period Weeks   Status On-going   Target Date 01/20/17     PT LONG TERM GOAL #8   Title Patient negotiates ramps, curbs & stairs with LRAD modified independent.    Time 8    Period Weeks   Status On-going   Target Date 01/20/17     PT LONG TERM GOAL  #9   TITLE Berg Balance >36/56  Time 8   Period Weeks   Status On-going   Target Date 01/20/17               Plan - 01/04/17 1750    Clinical Impression Statement Patient was able to improve core stability during gait at end of session after skilled activities during the session. Patient reports first time she tried 3 sets 2hr simulated work at her computer she had significant increase pain in upper Trapezuis area. She had her daughter give her a massage which helped. She reports mild pain typical to longer activities the second day. Patient is improving with ability to tolerate modified work if she can have breaks during her shift.    Rehab Potential Good   Clinical Impairments Affecting Rehab Potential HTN, cervical spondylosis, C6-7 L side disc protrusion causing L foraminal impingement, anemia, functional neurological disorder, anemia     PT Frequency 2x / week   PT Duration Other (comment)   PT Treatment/Interventions ADLs/Self Care Home Management;Electrical Stimulation;Neuromuscular re-education;Balance training;Therapeutic exercise;Therapeutic activities;Functional mobility training;Stair training;Gait training;DME Instruction;Patient/family education;Energy conservation   PT Next Visit Plan gait outdoors with rollator Stayer. neuromuscular activities to improve standing balance & control. Progress sitting at desk tolerance for return to work.    Consulted and Agree with Plan of Care Patient      Patient will benefit from skilled therapeutic intervention in order to improve the following deficits and impairments:  Decreased activity tolerance, Decreased balance, Decreased mobility, Decreased knowledge of use of DME, Decreased endurance, Decreased strength, Impaired flexibility, Impaired tone  Visit Diagnosis: Other symptoms and signs involving the nervous system  Unsteadiness on  feet  Other abnormalities of gait and mobility  Muscle weakness (generalized)  Ataxic gait     Problem List Patient Active Problem List   Diagnosis Date Noted  . Abnormal laboratory test 09/09/2016  . Gait abnormality 08/03/2016  . Weakness 08/03/2016  . Paresthesia 08/03/2016  . S/P cervical spinal fusion 10/23/2015  . Hypersomnia 12/08/2010  . 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  . EPIGASTRIC PAIN 10/13/2009  . IRRITABLE BOWEL SYNDROME 03/10/2009  . BACK PAIN, LUMBAR 07/01/2008  . COSTOCHONDRITIS 02/01/2008  . VIRAL URI 01/08/2008  . CHEST PAIN 01/08/2008  . 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 ARM PAIN 12/25/2007  . ANEMIA-NOS 10/02/2007  . Essential hypertension 10/02/2007  . GERD 10/02/2007  . POLYP, GALLBLADDER 10/02/2007  . UTERINE POLYP 10/02/2007  . OSTEOARTHRITIS 10/02/2007  . Dyskinesia 10/02/2007  . HEADACHE 10/02/2007  . PALPITATIONS 10/02/2007  . CARDIAC MURMUR, HX OF 10/02/2007  . NEPHROLITHIASIS, HX OF 10/02/2007    Jamey Reas PT, DPT 01/05/2017, 7:55 AM  [facility] [address] [place], [place], [address] Phone: [phone]   Fax:  [phone]  Name: CELESTINA GIRONDA MRN: [id number] Date of Birth: 1970/08/11

## 2017-01-09 ENCOUNTER — Encounter: Payer: Self-pay | Admitting: Physical Therapy

## 2017-01-09 ENCOUNTER — Ambulatory Visit: Payer: 59 | Admitting: Physical Therapy

## 2017-01-09 DIAGNOSIS — R2681 Unsteadiness on feet: Secondary | ICD-10-CM

## 2017-01-09 DIAGNOSIS — R29818 Other symptoms and signs involving the nervous system: Secondary | ICD-10-CM

## 2017-01-09 DIAGNOSIS — R2689 Other abnormalities of gait and mobility: Secondary | ICD-10-CM

## 2017-01-09 DIAGNOSIS — M6281 Muscle weakness (generalized): Secondary | ICD-10-CM

## 2017-01-09 DIAGNOSIS — R279 Unspecified lack of coordination: Secondary | ICD-10-CM

## 2017-01-09 DIAGNOSIS — R260 Ataxic gait: Secondary | ICD-10-CM

## 2017-01-09 NOTE — Patient Instructions (Addendum)
PT recommends standing for stretch & position change for 3-5 minutes each 30 minutes.  Currently managing simulated desk work 2hrs 4 times /day with 1 hr breaks between. 4 days / wk similar to work week. Starting 10/9: do 3hrs work, 3times /day with 1 hr breaks between sessions. 4 days/ wk similar to work week.  Goal is on 10/22: do 4hrs work, 2 times /day with 2 hrs off between sessions. 4 days/wk similar to work week.  Goal is on 11/5: do 4.5hrs work, 2 times /day with 2 hrs off between sessions. 4 days/wk similar to work week.  Goal is on 11/19: do 5 hrs work, 2 times/day with 2 hrs off between sessions. 4 days/wk similar to work week.  Goal is on 12/3 do 5hrs work, 2 times /day with 1 hr off between sessions. 4 days/wk similar to work week.  After 12/10 begin to progress 10 hrs work above to take 1 hr dinner break during typical time of day / shift.    Sitting Trunk work: Sit in chair with armrests. Sit away from the back with hips at front edge of chair.  1. CONTROL: roll your spine until back is touching the chair back. Sit up straight rolling back. Use hands as little as possible to assist. Focus on trunk doing most of work. Do 10 times.  2. Lean forward to touch toes. Alternate reaching with each hand and opposite hand on your thigh. Do 10 times.  3. Place object like lotion bottle (~7" tall) on floor on each side of you. Hold chair handle with one hand & lean to side /side bend to opposite side. Sit back up straight. Use trunk more than arms. Switch sides. Do 5-10 times on each side.  4. Hands on knees. Look over your shoulder pulling hand on that side to your hip. Don't lean to side. Sit up straight. Repeat to other side. Do 5-10 times on each side.

## 2017-01-10 DIAGNOSIS — I10 Essential (primary) hypertension: Secondary | ICD-10-CM | POA: Diagnosis not present

## 2017-01-10 DIAGNOSIS — K219 Gastro-esophageal reflux disease without esophagitis: Secondary | ICD-10-CM | POA: Diagnosis not present

## 2017-01-10 NOTE — Therapy (Signed)
[facility] [address] [place], [place], [address] Phone: [phone]   Fax:  [phone]  Physical Therapy Treatment  Patient Details  Name: Amy Barry MRN: [id number] Date of Birth: 06-24-1970 Referring Provider: Donald Prose MD   Encounter Date: 01/09/2017      PT End of Session - 01/09/17 1849    Visit Number 30   Number of Visits 35   Date for PT Re-Evaluation 01/20/17   Authorization Type [employer]    Authorization - Visit Number 30   Authorization - Number of Visits 60   PT Start Time 0947   PT Stop Time 1617   PT Time Calculation (min) 47 min   Equipment Utilized During Treatment Gait belt   Activity Tolerance Patient tolerated treatment well;Patient limited by fatigue;Patient limited by pain   Behavior During Therapy WFL for tasks assessed/performed      Past Medical History:  Diagnosis Date  . Anemia   . Dysrhythmia    pvc's holter monitor started lopressor- no return visit  . GERD (gastroesophageal reflux disease)   . Headache(784.0)   . Heart murmur    child no echo  . History of kidney stones   . Hypertension   . IBS (irritable bowel syndrome)   . Low vitamin D level   . Lower extremity weakness   . Neuromuscular disorder ([place])   . Numbness and tingling   . Osteoarthritis   . Pneumonia    January- tx with IV antibiotics  for pneumonia in 04/2015   . PVC (premature ventricular contraction)   . Seizure disorder ([place])    denied seizures- has dyskinesia  . Sleep concern 2013   toldf that she doesn 't have apnea but she stays sleeping a lot  . Upper extremity weakness     Past Surgical History:  Procedure Laterality Date  . 2 ovarian cysts     age 22  . CERVICAL DISC ARTHROPLASTY N/A 10/23/2015   Procedure: Cervical Disc Arthroplasty Cervical six-seven;  Surgeon: Eustace Moore, MD;  Location: [facility];  Service: Neurosurgery;  Laterality: N/A;  . ESOPHAGOGASTRODUODENOSCOPY      for GERD  . LAPAROSCOPIC ABDOMINAL EXPLORATION     dx ibs  . TUBAL LIGATION      There were no vitals filed for this visit.      Subjective Assessment - 01/09/17 1529    Subjective She sat at her desk 2hrs 3sets one day & 4 sets 2 days. She spoke with PCP. She has appt tomorrow. Plans are to return to work under moditifications.    Limitations Sitting;Lifting;Standing;Walking;House hold activities   Patient Stated Goals sitting upright, walking, driving, cooking, cleaning, return to work, walk the dog    Currently in Pain? Yes   Pain Score 6    Pain Location Generalized   Pain Orientation Right;Left   Pain Descriptors / Indicators Aching;Burning   Pain Type Chronic pain   Pain Onset More than a month ago   Pain Frequency Constant   Aggravating Factors  unknown   Pain Relieving Factors warm shower       Self-care:  PT recommends standing for stretch & position change for 3-5 minutes each 30 minutes.  Currently managing simulated desk work 2hrs 4 times /day with 1 hr breaks between. 4 days / wk similar to work week. Starting 10/9: do 3hrs work, 3times /day with 1 hr breaks between sessions. 4 days/ wk similar to work week.  Goal is on 10/22: do 4hrs work, 2 times /day with 2 hrs off between sessions. 4 days/wk similar to work week.  Goal is on 11/5: do 4.5hrs work, 2 times /day with 2 hrs off between sessions. 4 days/wk similar to work week.  Goal is on 11/19: do 5 hrs work, 2 times/day with 2 hrs off between sessions. 4 days/wk similar to work week.  Goal is on 12/3 do 5hrs work, 2 times /day with 1 hr off between sessions. 4 days/wk similar to work week.  After 12/10 begin to progress 10 hrs work above to take 1 hr dinner break during typical time of day / shift.   Neuromuscular Re-ed: Sitting balance with tactile, visual & verbal cues on righting reactions and using trunk > UE muscles.  Sitting Trunk work: Sit in chair with armrests. Sit away from the back with hips at  front edge of chair.  1. CONTROL: roll your spine until back is touching the chair back. Sit up straight rolling back. Use hands as little as possible to assist. Focus on trunk doing most of work. Do 10 times.  2. Lean forward to touch toes. Alternate reaching with each hand and opposite hand on your thigh. Do 10 times.  3. Place object like lotion bottle (~7" tall) on floor on each side of you. Hold chair handle with one hand & lean to side /side bend to opposite side. Sit back up straight. Use trunk more than arms. Switch sides. Do 5-10 times on each side.  4. Hands on knees. Look over your shoulder pulling hand on that side to your hip. Don't lean to side. Sit up straight. Repeat to other side. Do 5-10 times on each side.    Gait Training: Pt ambulated 100' X 2 with rollator Catano with supervision with cues on posture & core control.                         PT Education - 01/09/17 1530    Education provided Yes   Education Details return to work schedule - see pt instruction, HEP sitting trunk    Person(s) Educated Patient   Methods Explanation;Verbal cues;Handout;Demonstration;Tactile cues   Comprehension Verbalized understanding;Returned demonstration;Verbal cues required;Tactile cues required;Need further instruction          PT Short Term Goals - 12/21/16 1834      PT SHORT TERM GOAL #1   Title Patient verbalizes and demonstrates understanding of updated HEP.    Baseline MET 12/21/16   Time 4   Period Weeks   Status Achieved     PT SHORT TERM GOAL #2   Title Patient reaches 7" anteriorly and to knee level standing balance with single UE support on AD or counter with supervision.    Baseline MET 12/21/16   Time 4   Period Weeks   Status Achieved     PT SHORT TERM GOAL #3   Title Patient sit to/from stand from chair without armrests using UEs to Delio with supervision.    Baseline MET 12/21/16   Time 4   Period Weeks   Status Achieved     PT SHORT  TERM GOAL #4   Title Patient ambulates 500' with rollator Mussell with supervision.    Baseline MET 12/21/16   Time 4   Period Weeks   Status Achieved     PT SHORT TERM GOAL #5   Title Patient negotiates ramps & curbs with rollator Yazdani  with minA.    Baseline MET 12/21/16   Time 4   Period Weeks   Status Achieved           PT Long Term Goals - 12/21/16 1836      PT LONG TERM GOAL #1   Title Patient will verbalize understanding and return demonstration for ongoing HEP.  (All LTGs Target Date: 01/20/17)   Time 8   Period Weeks   Status On-going   Target Date 01/20/17     PT LONG TERM GOAL #6   Title Patient will improvem FOTO score by >/= 10% to indicate improvement in self-perceived functional mobility. (TARGET DATE: 11/25/2016)    Time 8   Period Weeks   Status On-going   Target Date 01/20/17     PT LONG TERM GOAL #7   Title Patient ambulates 500' with LRAD modified independent.    Time 8   Period Weeks   Status On-going   Target Date 01/20/17     PT LONG TERM GOAL #8   Title Patient negotiates ramps, curbs & stairs with LRAD modified independent.    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 Patient improved trunk control with skilled instruction & repetition but requires some UE assist to return to upright from all directions.    Rehab Potential Good   Clinical Impairments Affecting Rehab Potential HTN, cervical spondylosis, C6-7 L side disc protrusion causing L foraminal impingement, anemia, functional neurological disorder, anemia     PT Frequency 2x / week   PT Duration Other (comment)   PT Treatment/Interventions ADLs/Self Care Home  Management;Electrical Stimulation;Neuromuscular re-education;Balance training;Therapeutic exercise;Therapeutic activities;Functional mobility training;Stair training;Gait training;DME Instruction;Patient/family education;Energy conservation   PT Next Visit Plan progress trunk control & standing balance; gait with cane and counter.    Consulted and Agree with Plan of Care Patient      Patient will benefit from skilled therapeutic intervention in order to improve the following deficits and impairments:  Decreased activity tolerance, Decreased balance, Decreased mobility, Decreased knowledge of use of DME, Decreased endurance, Decreased strength, Impaired flexibility, Impaired tone  Visit Diagnosis: Other symptoms and signs involving the nervous system  Unsteadiness on feet  Other abnormalities of gait and mobility  Muscle weakness (generalized)  Ataxic gait  Unspecified lack of coordination     Problem List Patient Active Problem List   Diagnosis Date Noted  . Abnormal laboratory test 09/09/2016  . Gait abnormality 08/03/2016  . Weakness 08/03/2016  . Paresthesia 08/03/2016  . S/P cervical spinal fusion 10/23/2015  . Hypersomnia 12/08/2010  . 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  . EPIGASTRIC PAIN 10/13/2009  . IRRITABLE BOWEL SYNDROME 03/10/2009  . BACK PAIN, LUMBAR 07/01/2008  . COSTOCHONDRITIS 02/01/2008  . VIRAL URI 01/08/2008  . CHEST PAIN 01/08/2008  . 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 ARM PAIN 12/25/2007  . ANEMIA-NOS 10/02/2007  . Essential hypertension 10/02/2007  . GERD 10/02/2007  . POLYP, GALLBLADDER 10/02/2007  . UTERINE POLYP 10/02/2007  . OSTEOARTHRITIS 10/02/2007  . Dyskinesia 10/02/2007  .  HEADACHE 10/02/2007  . PALPITATIONS 10/02/2007  . CARDIAC MURMUR, HX OF 10/02/2007  . NEPHROLITHIASIS, HX OF 10/02/2007    Jamey Reas PT, DPT 01/10/2017, 12:27 PM  [facility] [address] [place], [place], [address] Phone: [phone]   Fax:  [phone]  Name: Amy Barry MRN: [id number] Date of Birth: July 23, 1970

## 2017-01-11 ENCOUNTER — Ambulatory Visit: Payer: 59 | Admitting: Physical Therapy

## 2017-01-11 VITALS — BP 134/83 | HR 81

## 2017-01-11 DIAGNOSIS — R2689 Other abnormalities of gait and mobility: Secondary | ICD-10-CM

## 2017-01-11 DIAGNOSIS — R29818 Other symptoms and signs involving the nervous system: Secondary | ICD-10-CM | POA: Diagnosis not present

## 2017-01-11 DIAGNOSIS — R2681 Unsteadiness on feet: Secondary | ICD-10-CM

## 2017-01-11 DIAGNOSIS — R260 Ataxic gait: Secondary | ICD-10-CM

## 2017-01-11 DIAGNOSIS — M6281 Muscle weakness (generalized): Secondary | ICD-10-CM

## 2017-01-11 NOTE — Therapy (Signed)
[facility] [address] [place], [place], [address] Phone: [phone]   Fax:  [phone]  Physical Therapy Treatment  Patient Details  Name: Amy Barry MRN: [id number] Date of Birth: [birth year] Referring Provider: Donald Prose MD   Encounter Date: 01/11/2017      PT End of Session - 01/11/17 1654    Visit Number 31   Number of Visits 35   Date for PT Re-Evaluation 01/20/17   Authorization Type [employer]    Authorization - Visit Number 31   Authorization - Number of Visits 60   PT Start Time 4174   PT Stop Time 1620   PT Time Calculation (min) 50 min   Activity Tolerance Patient tolerated treatment well;Patient limited by fatigue;Patient limited by pain   Behavior During Therapy [facility] for tasks assessed/performed      Past Medical History:  Diagnosis Date  . Anemia   . Dysrhythmia    pvc's holter monitor started lopressor- no return visit  . GERD (gastroesophageal reflux disease)   . Headache(784.0)   . Heart murmur    child no echo  . History of kidney stones   . Hypertension   . IBS (irritable bowel syndrome)   . Low vitamin D level   . Lower extremity weakness   . Neuromuscular disorder ([place])   . Numbness and tingling   . Osteoarthritis   . Pneumonia    January- tx with IV antibiotics  for pneumonia in 04/2015   . PVC (premature ventricular contraction)   . Seizure disorder ([place])    denied seizures- has dyskinesia  . Sleep concern 2013   toldf that she doesn 't have apnea but she stays sleeping a lot  . Upper extremity weakness     Past Surgical History:  Procedure Laterality Date  . 2 ovarian cysts     age 82  . CERVICAL DISC ARTHROPLASTY N/A 10/23/2015   Procedure: Cervical Disc Arthroplasty Cervical six-seven;  Surgeon: Eustace Moore, MD;  Location: [facility];  Service: Neurosurgery;  Laterality: N/A;  . ESOPHAGOGASTRODUODENOSCOPY     for GERD  . LAPAROSCOPIC ABDOMINAL  EXPLORATION     dx ibs  . TUBAL LIGATION      Vitals:   01/11/17 1542 01/11/17 1636  BP: (!) 133/94 134/83  Pulse: 78 81        Subjective Assessment - 01/11/17 1542    Subjective Pt reports pain is up today; only pattern she can find is that it worsens right before her menstrual cycle.  MD may add medication for nerve pain. Still monitoring BP since coming off BP medication.  Fatigue level has not improved.     Patient is accompained by: Family member   Limitations Sitting;Lifting;Standing;Walking;House hold activities   How long can you sit comfortably? 3 hrs prior to onset of pain    Patient Stated Goals sitting upright, walking, driving, cooking, cleaning, return to work, walk the dog    Currently in Pain? Yes   Pain Score 7    Pain Location Generalized   Pain Descriptors / Indicators Burning;Aching   Pain Type Neuropathic pain   Pain Onset In the past 7 days                         [facility] Adult PT Treatment/Exercise - 01/11/17 1547      Ambulation/Gait   Ambulation/Gait Yes   Ambulation/Gait Assistance 5: Supervision;3: Mod assist  Ambulation/Gait Assistance Details Supervision for gait with rollator on level indoor surfaces.  When peforming gait training with cane with quad tip pt required mod A for trunk control, balance and for sequencing   Ambulation Distance (Feet) 57 Feet   Assistive device Rollator;Straight cane   Gait Pattern Step-to pattern;Step-through pattern;Right steppage;Left steppage;Right genu recurvatum;Left genu recurvatum;Ataxic;Lateral hip instability;Trunk flexed   Ambulation Surface Level;Indoor   Pre-Gait Activities Performed pre-gait training with [place] in [place] supported on counter performing 10 reps each: cane and R foot taps to 4" step, cane and R foot to step with L foot performing step to on to step and then progressing to stepping forwards and backwards over step with UE support on counter.  Progressed to gait sequence  training beside counter but without UE support on counter performing 1 lap forwards and backwards with cane and step to gait sequence with Mod A                PT Education - 01/11/17 1653    Education provided Yes   Education Details gait train with cane   Person(s) Educated Patient   Methods Explanation;Demonstration   Comprehension Need further instruction          PT Short Term Goals - 12/21/16 1834      PT SHORT TERM GOAL #1   Title Patient verbalizes and demonstrates understanding of updated HEP.    Baseline MET 12/21/16   Time 4   Period Weeks   Status Achieved     PT SHORT TERM GOAL #2   Title Patient reaches 7" anteriorly and to knee level standing balance with single UE support on AD or counter with supervision.    Baseline MET 12/21/16   Time 4   Period Weeks   Status Achieved     PT SHORT TERM GOAL #3   Title Patient sit to/from stand from chair without armrests using UEs to [place] with supervision.    Baseline MET 12/21/16   Time 4   Period Weeks   Status Achieved     PT SHORT TERM GOAL #4   Title Patient ambulates 500' with rollator [place] with supervision.    Baseline MET 12/21/16   Time 4   Period Weeks   Status Achieved     PT SHORT TERM GOAL #5   Title Patient negotiates ramps & curbs with rollator [place] with minA.    Baseline MET 12/21/16   Time 4   Period Weeks   Status Achieved           PT Long Term Goals - 01/11/17 1659      PT LONG TERM GOAL #1   Title Patient will verbalize understanding and return demonstration for ongoing HEP.  (All LTGs Target Date: 01/20/17)   Time 8   Period Weeks   Status On-going   Target Date 01/20/17     PT LONG TERM GOAL #6   Title Patient will improvem FOTO score by >/= 10% to indicate improvement in self-perceived functional mobility.   Time 8   Period Weeks   Status On-going   Target Date 01/20/17     PT LONG TERM GOAL #7   Title Patient ambulates 500' with LRAD modified independent.     Time 8   Period Weeks   Status On-going   Target Date 01/20/17     PT LONG TERM GOAL #8   Title Patient negotiates ramps, curbs & stairs with LRAD modified independent.  Time 8   Period Weeks   Status On-going   Target Date 01/20/17     PT LONG TERM GOAL  #9   TITLE Berg Balance >36/56    Time 8   Period Weeks   Status On-going   Target Date 01/20/17               Plan - 01/11/17 1654    Clinical Impression Statement Despite increase in pain today pt able to tolerate initiation of gait training with cane.  Performed slow progression from pre-gait training with cane and one UE support on counter >> ambulating beside counter >> ambulating partial distance around treatment gym.  Pt fatigued at end of session but continues to be motivated to progress and continues to be motivated to return to work.   Rehab Potential Good   Clinical Impairments Affecting Rehab Potential HTN, cervical spondylosis, C6-7 L side disc protrusion causing L foraminal impingement, anemia, functional neurological disorder, anemia     PT Frequency 2x / week   PT Duration Other (comment)   PT Treatment/Interventions ADLs/Self Care Home Management;Electrical Stimulation;Neuromuscular re-education;Balance training;Therapeutic exercise;Therapeutic activities;Functional mobility training;Stair training;Gait training;DME Instruction;Patient/family education;Energy conservation   PT Next Visit Plan progress trunk control & standing balance; progress gait with cane   Consulted and Agree with Plan of Care Patient      Patient will benefit from skilled therapeutic intervention in order to improve the following deficits and impairments:  Decreased activity tolerance, Decreased balance, Decreased mobility, Decreased knowledge of use of DME, Decreased endurance, Decreased strength, Impaired flexibility, Impaired tone  Visit Diagnosis: Other symptoms and signs involving the nervous system  Unsteadiness on  feet  Other abnormalities of gait and mobility  Muscle weakness (generalized)  Ataxic gait     Problem List Patient Active Problem List   Diagnosis Date Noted  . Abnormal laboratory test 09/09/2016  . Gait abnormality 08/03/2016  . Weakness 08/03/2016  . Paresthesia 08/03/2016  . S/P cervical spinal fusion 10/23/2015  . Hypersomnia 12/08/2010  . 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  . EPIGASTRIC PAIN 10/13/2009  . IRRITABLE BOWEL SYNDROME 03/10/2009  . BACK PAIN, LUMBAR 07/01/2008  . COSTOCHONDRITIS 02/01/2008  . VIRAL URI 01/08/2008  . CHEST PAIN 01/08/2008  . 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 ARM PAIN 12/25/2007  . ANEMIA-NOS 10/02/2007  . Essential hypertension 10/02/2007  . GERD 10/02/2007  . POLYP, GALLBLADDER 10/02/2007  . UTERINE POLYP 10/02/2007  . OSTEOARTHRITIS 10/02/2007  . Dyskinesia 10/02/2007  . HEADACHE 10/02/2007  . PALPITATIONS 10/02/2007  . CARDIAC MURMUR, HX OF 10/02/2007  . NEPHROLITHIASIS, HX OF 10/02/2007    Raylene Everts, PT, DPT 01/11/17    5:00 PM     [facility] [address] [place], [place], [address] Phone: [phone]   Fax:  [phone]  Name: Amy Barry MRN: [id number] Date of Birth: 07-11-1970

## 2017-01-16 ENCOUNTER — Encounter: Payer: Self-pay | Admitting: Physical Therapy

## 2017-01-16 ENCOUNTER — Ambulatory Visit: Payer: 59 | Admitting: Physical Therapy

## 2017-01-16 DIAGNOSIS — R29818 Other symptoms and signs involving the nervous system: Secondary | ICD-10-CM | POA: Diagnosis not present

## 2017-01-16 DIAGNOSIS — R279 Unspecified lack of coordination: Secondary | ICD-10-CM

## 2017-01-16 DIAGNOSIS — M6281 Muscle weakness (generalized): Secondary | ICD-10-CM

## 2017-01-16 DIAGNOSIS — R260 Ataxic gait: Secondary | ICD-10-CM

## 2017-01-16 DIAGNOSIS — R2689 Other abnormalities of gait and mobility: Secondary | ICD-10-CM

## 2017-01-16 DIAGNOSIS — R2681 Unsteadiness on feet: Secondary | ICD-10-CM

## 2017-01-16 DIAGNOSIS — Z9181 History of falling: Secondary | ICD-10-CM

## 2017-01-16 NOTE — Therapy (Signed)
[facility] [address] [place], [place], [address] Phone: [phone]   Fax:  [phone]  Physical Therapy Treatment  Patient Details  Name: Amy Barry MRN: [id number] Date of Birth: 05/17/1970 Referring Provider: Donald Prose MD   Encounter Date: 01/16/2017      PT End of Session - 01/16/17 1642    Visit Number 32   Number of Visits 35   Date for PT Re-Evaluation 01/20/17   Authorization Type [employer]    Authorization - Visit Number 39   Authorization - Number of Visits 60   PT Start Time 8250   PT Stop Time 1620   PT Time Calculation (min) 47 min   Activity Tolerance Patient tolerated treatment well   Behavior During Therapy [facility] for tasks assessed/performed      Past Medical History:  Diagnosis Date  . Anemia   . Dysrhythmia    pvc's holter monitor started lopressor- no return visit  . GERD (gastroesophageal reflux disease)   . Headache(784.0)   . Heart murmur    child no echo  . History of kidney stones   . Hypertension   . IBS (irritable bowel syndrome)   . Low vitamin D level   . Lower extremity weakness   . Neuromuscular disorder ([place])   . Numbness and tingling   . Osteoarthritis   . Pneumonia    January- tx with IV antibiotics  for pneumonia in 04/2015   . PVC (premature ventricular contraction)   . Seizure disorder ([place])    denied seizures- has dyskinesia  . Sleep concern 2013   toldf that she doesn 't have apnea but she stays sleeping a lot  . Upper extremity weakness     Past Surgical History:  Procedure Laterality Date  . 2 ovarian cysts     age 61  . CERVICAL DISC ARTHROPLASTY N/A 10/23/2015   Procedure: Cervical Disc Arthroplasty Cervical six-seven;  Surgeon: Eustace Moore, MD;  Location: [facility];  Service: Neurosurgery;  Laterality: N/A;  . ESOPHAGOGASTRODUODENOSCOPY     for GERD  . LAPAROSCOPIC ABDOMINAL EXPLORATION     dx ibs  . TUBAL LIGATION      There  were no vitals filed for this visit.      Subjective Assessment - 01/16/17 1539    Subjective Pain is improved today.  Walked around the grocery store with husband and [place] on Friday, rested a lot the rest of the weekend.  Pt to start back on BP medication.   Patient is accompained by: Family member   Limitations Sitting;Lifting;Standing;Walking;House hold activities   How long can you sit comfortably? 3 hrs prior to onset of pain    Patient Stated Goals sitting upright, walking, driving, cooking, cleaning, return to work, walk the dog    Currently in Pain? Yes   Pain Score 4    Pain Location Generalized   Pain Descriptors / Indicators Aching;Burning   Pain Type Chronic pain   Pain Onset More than a month ago            [facility] PT Assessment - 01/16/17 1540      Ambulation/Gait   Ambulation/Gait Yes   Ambulation/Gait Assistance 5: Supervision   Ambulation Distance (Feet) 500 Feet   Assistive device Rollator   Gait Pattern Step-through pattern;Right steppage;Left steppage;Right genu recurvatum;Left genu recurvatum;Ataxic;Lateral hip instability;Trunk flexed   Ambulation Surface Level;Indoor   Stairs Yes   Stairs Assistance 6: Modified independent (Device/Increase time)  Stairs Assistance Details (indicate cue type and reason) pt performs full flight of stairs at home alone, without assistance   Stair Management Technique One rail Right;Step to pattern;Sideways   Number of Stairs 12   Height of Stairs 6   Ramp 5: Supervision   Ramp Details (indicate cue type and reason) with rollator with brakes on to slow momentum   Curb 5: Supervision;4: Min assist   Curb Details (indicate cue type and reason) Supervision overall for balance, no verbal cues for sequence.  Min A only when lifting rollator up onto curb      Standardized Balance Assessment   Standardized Balance Assessment Berg Balance Test     Berg Balance Test   Sit to Stand Able to stand  independently using hands    Standing Unsupported Unable to stand 30 seconds unassisted   Sitting with Back Unsupported but Feet Supported on Floor or Stool Able to sit safely and securely 2 minutes   Stand to Sit Controls descent by using hands   Transfers Able to transfer safely, definite need of hands   Standing Unsupported with Eyes Closed Needs help to keep from falling   Standing Ubsupported with Feet Together Needs help to attain position and unable to hold for 15 seconds   From Standing, Reach Forward with Outstretched Arm Loses balance while trying/requires external support   From Standing Position, Pick up Object from Floor Unable to try/needs assist to keep balance   From Standing Position, Turn to Look Behind Over each Shoulder Needs assist to keep from losing balance and falling   Turn 360 Degrees Needs assistance while turning   Standing Unsupported, Alternately Place Feet on Step/Stool Needs assistance to keep from falling or unable to try   Standing Unsupported, One Foot in [employer] balance while stepping or standing   Standing on One Leg Tries to lift leg/unable to hold 3 seconds but remains standing independently   Total Score 14   Berg comment: 14/56                             PT Education - 01/16/17 1641    Education provided Yes   Education Details balance assessment and falls risk based on BERG score, options for safe endurance training at clubhouse gym   Person(s) Educated Patient   Methods Explanation   Comprehension Verbalized understanding          PT Short Term Goals - 12/21/16 1834      PT SHORT TERM GOAL #1   Title Patient verbalizes and demonstrates understanding of updated HEP.    Baseline MET 12/21/16   Time 4   Period Weeks   Status Achieved     PT SHORT TERM GOAL #2   Title Patient reaches 7" anteriorly and to knee level standing balance with single UE support on AD or counter with supervision.    Baseline MET 12/21/16   Time 4   Period Weeks    Status Achieved     PT SHORT TERM GOAL #3   Title Patient sit to/from stand from chair without armrests using UEs to [place] with supervision.    Baseline MET 12/21/16   Time 4   Period Weeks   Status Achieved     PT SHORT TERM GOAL #4   Title Patient ambulates 500' with rollator [place] with supervision.    Baseline MET 12/21/16   Time 4   Period Weeks  Status Achieved     PT SHORT TERM GOAL #5   Title Patient negotiates ramps & curbs with rollator [place] with minA.    Baseline MET 12/21/16   Time 4   Period Weeks   Status Achieved           PT Long Term Goals - 01/16/17 1647      PT LONG TERM GOAL #1   Title Patient will verbalize understanding and return demonstration for ongoing HEP.  (All LTGs Target Date: 01/20/17)   Time 8   Period Weeks   Status On-going   Target Date 01/20/17     PT LONG TERM GOAL #6   Title Patient will improvem FOTO score by >/= 10% to indicate improvement in self-perceived functional mobility.   Time 8   Period Weeks   Status On-going   Target Date 01/20/17     PT LONG TERM GOAL #7   Title Patient ambulates 500' with LRAD modified independent.    Baseline partially met; requires supervision for longer distances   Time 8   Period Weeks   Status Partially Met     PT LONG TERM GOAL #8   Title Patient negotiates ramps, curbs & stairs with LRAD modified independent.    Baseline MOD I for stair negotiation; supervision for ramp and curb negotiation with rollator   Time 8   Period Weeks   Status Partially Met     PT LONG TERM GOAL  #9   TITLE Berg Balance >36/56    Baseline 14/56-not met but baseline set   Time 8   Period Weeks   Status Not Met               Plan - 01/16/17 1642    Clinical Impression Statement Initiated assessment of pt progress with gait with rollator over level surfaces, inclines and curbs and stair negotiation.  Pt has made significant improvement and is now able to perform stair negotiation MOD I but  continues to require close supervision for gait on uneven surfaces with rollator.  Performed baseline BERG balance assessment with pt needing constant UE support or support from PT to maintain balance during static and dynamic standing balance tasks.  Discussed areas of balance to focus on to progress towards use of a cane.  Pt also interested in performing aerobic exercise for fatigue and weight management; discussed having pt visit community clubhouse to see what machines are available and will determine safest option at a future visit.  Pt is making steady progress; will continue to assess LTG and plan to recertify at next visit.   Rehab Potential Good   Clinical Impairments Affecting Rehab Potential HTN, cervical spondylosis, C6-7 L side disc protrusion causing L foraminal impingement, anemia, functional neurological disorder, anemia     PT Frequency 2x / week   PT Duration Other (comment)   PT Treatment/Interventions ADLs/Self Care Home Management;Electrical Stimulation;Neuromuscular re-education;Balance training;Therapeutic exercise;Therapeutic activities;Functional mobility training;Stair training;Gait training;DME Instruction;Patient/family education;Energy conservation   PT Next Visit Plan finish assessment of LTG (HEP, FOTO); recertify.  Discuss what machines are available at community clubhouse for pt to use for endurance training.  Gait training with cane.  balance with decreased UE support   Consulted and Agree with Plan of Care Patient      Patient will benefit from skilled therapeutic intervention in order to improve the following deficits and impairments:  Decreased activity tolerance, Decreased balance, Decreased mobility, Decreased knowledge of use of DME, Decreased endurance, Decreased strength,  Impaired flexibility, Impaired tone  Visit Diagnosis: Other symptoms and signs involving the nervous system  Unsteadiness on feet  Other abnormalities of gait and mobility  Muscle  weakness (generalized)  Ataxic gait  Unspecified lack of coordination  History of falling     Problem List Patient Active Problem List   Diagnosis Date Noted  . Abnormal laboratory test 09/09/2016  . Gait abnormality 08/03/2016  . Weakness 08/03/2016  . Paresthesia 08/03/2016  . S/P cervical spinal fusion 10/23/2015  . Hypersomnia 12/08/2010  . 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  . EPIGASTRIC PAIN 10/13/2009  . IRRITABLE BOWEL SYNDROME 03/10/2009  . BACK PAIN, LUMBAR 07/01/2008  . COSTOCHONDRITIS 02/01/2008  . VIRAL URI 01/08/2008  . CHEST PAIN 01/08/2008  . 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 ARM PAIN 12/25/2007  . ANEMIA-NOS 10/02/2007  . Essential hypertension 10/02/2007  . GERD 10/02/2007  . POLYP, GALLBLADDER 10/02/2007  . UTERINE POLYP 10/02/2007  . OSTEOARTHRITIS 10/02/2007  . Dyskinesia 10/02/2007  . HEADACHE 10/02/2007  . PALPITATIONS 10/02/2007  . CARDIAC MURMUR, HX OF 10/02/2007  . NEPHROLITHIASIS, HX OF 10/02/2007   Raylene Everts, PT, DPT 01/16/17    4:50 PM    [facility] [address] [place], [place], [address] Phone: [phone]   Fax:  [phone]  Name: DYAMOND TOLOSA MRN: [id number] Date of Birth: 10/07/1970

## 2017-01-17 DIAGNOSIS — R29898 Other symptoms and signs involving the musculoskeletal system: Secondary | ICD-10-CM | POA: Diagnosis not present

## 2017-01-17 DIAGNOSIS — R531 Weakness: Secondary | ICD-10-CM | POA: Diagnosis not present

## 2017-01-18 ENCOUNTER — Encounter: Payer: Self-pay | Admitting: Physical Therapy

## 2017-01-18 ENCOUNTER — Ambulatory Visit: Payer: 59 | Admitting: Physical Therapy

## 2017-01-18 VITALS — BP 142/82 | HR 101

## 2017-01-18 DIAGNOSIS — M6281 Muscle weakness (generalized): Secondary | ICD-10-CM

## 2017-01-18 DIAGNOSIS — R2681 Unsteadiness on feet: Secondary | ICD-10-CM

## 2017-01-18 DIAGNOSIS — R2689 Other abnormalities of gait and mobility: Secondary | ICD-10-CM

## 2017-01-18 DIAGNOSIS — R29818 Other symptoms and signs involving the nervous system: Secondary | ICD-10-CM | POA: Diagnosis not present

## 2017-01-18 DIAGNOSIS — R260 Ataxic gait: Secondary | ICD-10-CM

## 2017-01-19 NOTE — Therapy (Signed)
[facility] [address] [place], [place], [address] Phone: [phone]   Fax:  [phone]  Physical Therapy Treatment  Patient Details  Name: Amy Barry MRN: [id number] Date of Birth: 08/26/1970 Referring Provider: Donald Prose MD   Encounter Date: 01/18/2017      PT End of Session - 01/18/17 1817    Visit Number 33   Number of Visits 45   Date for PT Re-Evaluation 04/14/17   Authorization Type [employer]    Authorization - Visit Number 39   Authorization - Number of Visits 60   PT Start Time 4010   PT Stop Time 1616   PT Time Calculation (min) 42 min   Activity Tolerance Patient tolerated treatment well   Behavior During Therapy WFL for tasks assessed/performed      Past Medical History:  Diagnosis Date  . Anemia   . Dysrhythmia    pvc's holter monitor started lopressor- no return visit  . GERD (gastroesophageal reflux disease)   . Headache(784.0)   . Heart murmur    child no echo  . History of kidney stones   . Hypertension   . IBS (irritable bowel syndrome)   . Low vitamin D level   . Lower extremity weakness   . Neuromuscular disorder ([place])   . Numbness and tingling   . Osteoarthritis   . Pneumonia    January- tx with IV antibiotics  for pneumonia in 04/2015   . PVC (premature ventricular contraction)   . Seizure disorder ([place])    denied seizures- has dyskinesia  . Sleep concern 2013   toldf that she doesn 't have apnea but she stays sleeping a lot  . Upper extremity weakness     Past Surgical History:  Procedure Laterality Date  . 2 ovarian cysts     age 79  . CERVICAL DISC ARTHROPLASTY N/A 10/23/2015   Procedure: Cervical Disc Arthroplasty Cervical six-seven;  Surgeon: Eustace Moore, MD;  Location: [facility];  Service: Neurosurgery;  Laterality: N/A;  . ESOPHAGOGASTRODUODENOSCOPY     for GERD  . LAPAROSCOPIC ABDOMINAL EXPLORATION     dx ibs  . TUBAL LIGATION      Vitals:    01/18/17 1540  BP: (!) 142/82  Pulse: (!) 101        Subjective Assessment - 01/18/17 1541    Subjective She has been sitting at her desk 3hrs 3 times per day to simulate work. 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 Patient reports she feels she has made significant progress with PT but needs more to improve her mobility further.     Limitations Sitting;Lifting;Standing;Walking;House hold activities   Patient Stated Goals sitting upright, walking, driving, cooking, cleaning, return to work, walk the dog    Currently in Pain? Yes   Pain Score 6    Pain Location Generalized   Pain Orientation Right   Pain Descriptors / Indicators Aching;Burning   Pain Type Chronic pain   Pain Onset More than a month ago   Pain Frequency Constant   Aggravating Factors  unknown   Pain Relieving Factors warm shower     Neuromuscular Re-education: Standing with anterior knees against mat table with slight knee flexion and chair on mat for UE support: alternate UE support (RUE & LUE): PT giving manual cues on pelvis for maintaining center of mass over feet, mirror for visual feedback to  upright and verbal cues for maintaining weight over feet both laterally & ant/post & upright posture. Reaching forward 7-10", cervical & thoracic rotation to look over shoulders, moving <1# object from knee level to shoulder level and trunk side bends.   Gait Training:  Parallel bars with LUE support only: mirrors for visual feedback, midline board on floor for step width, verbal cues for upright posture/wt shift and tactile cues for pelvic control. 10' long bars x 20 reps without seated rest for 200' with single UE support.  Progressed to gait with rollator Christiano with light UE support and carryover to above. Pt ambulated 500' indoors & outdoors paved with verbal cues on technique.                               PT Short Term  Goals - 01/18/17 1820      PT SHORT TERM GOAL #1   Title Patient verbalizes and demonstrates understanding of updated HEP. (All STGs Target Date 02/17/17)   Time 4   Period Weeks   Status On-going   Target Date 02/17/17     PT SHORT TERM GOAL #2   Title Patient maintains upright for 1 minute without UE support with supervision.    Time 4   Period Weeks   Status New   Target Date 02/17/17     PT SHORT TERM GOAL #3   Title Patient ambulates 200' with cane with minA.    Time 4   Period Weeks   Status New   Target Date 02/17/17     PT SHORT TERM GOAL #4   Title Patient negotiates ramp & curb with cane with modA.    Time 4   Period Weeks   Status New   Target Date 02/17/17           PT Long Term Goals - 01/16/17 1647      PT LONG TERM GOAL #1   Title Patient will verbalize understanding and return demonstration for ongoing HEP.  (All LTGs Target Date: 01/20/17)   Time 8   Period Weeks   Status On-going   Target Date 01/20/17     PT LONG TERM GOAL #6   Title Patient will improvem FOTO score by >/= 10% to indicate improvement in self-perceived functional mobility.   Time 8   Period Weeks   Status On-going   Target Date 01/20/17     PT LONG TERM GOAL #7   Title Patient ambulates 500' with LRAD modified independent.    Baseline partially met; requires supervision for longer distances   Time 8   Period Weeks   Status Partially Met     PT LONG TERM GOAL #8   Title Patient negotiates ramps, curbs & stairs with LRAD modified independent.    Baseline MOD I for stair negotiation; supervision for ramp and curb negotiation with rollator   Time 8   Period Weeks   Status Partially Met     PT LONG TERM GOAL  #9   TITLE Berg Balance >36/56    Baseline 14/56-not met but baseline set   Time 8   Period Weeks   Status Not Met            PT Long Term Goals - 01/18/17 2221      PT LONG TERM GOAL #1   Title Patient will verbalize understanding and return  demonstration for ongoing HEP.  (All LTGs Target  Date: 04/14/17)   Baseline 01/18/17 progressing PT continues to update as condition improves.    Time 12   Period Weeks   Status On-going   Target Date 04/14/17     PT LONG TERM GOAL #2   Title Patient maintains upright without UE support for 2 minutes.    Time 12   Period Weeks   Status New   Target Date 04/14/17     PT LONG TERM GOAL #3   Title Patient reaches 10", scans environment, picks up object from floor with cane or less support safely modified independent.    Time 12   Period Weeks   Status New   Target Date 04/14/17     PT LONG TERM GOAL #4   Title Patient negotiates stairs with 1 rail reciprocal pattern modified independent.   Time 12   Period Weeks   Status New   Target Date 04/14/17     PT LONG TERM GOAL #5   Title Timed Up & Go with cane or less <25 sec.    Time 12   Period Weeks   Status New   Target Date 04/14/17     PT LONG TERM GOAL #6   Title Patient will improvem FOTO score by >/= 10% to indicate improvement in self-perceived functional mobility.   Time 12   Period Weeks   Status On-going   Target Date 04/14/17     PT LONG TERM GOAL #7   Title Patient ambulates 500' with cane or less modified independent.    Time 12   Period Weeks   Status Revised   Target Date 04/14/17     PT LONG TERM GOAL #8   Title Patient negotiates ramps, curbs & stairs with cane or less modified independent.    Time 12   Period Weeks   Status Revised   Target Date 04/14/17     PT LONG TERM GOAL  #9   TITLE Berg Balance >36/56    Time 12   Period Weeks   Status On-going   Target Date 04/14/17              Plan - 01/18/17 1824    Clinical Impression Statement This patient has progressed her mobility to community with a rollator Sarti including ramps & curbs. She is able to negotiate stairs with rail at her home. 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 She normally works 10hrs with 1 hr dinner break 4 days/wk. Patient appears would benefit from additional Physical Therapy to progress mobility to cane or less support.    Rehab Potential Good   Clinical Impairments Affecting Rehab Potential HTN, cervical spondylosis, C6-7 L side disc protrusion causing L foraminal impingement, anemia, functional neurological disorder, anemia     PT Frequency 1x / week   PT Duration 12 weeks   PT Treatment/Interventions ADLs/Self Care Home Management;Electrical Stimulation;Neuromuscular re-education;Balance training;Therapeutic exercise;Therapeutic activities;Functional mobility training;Stair training;Gait training;DME Instruction;Patient/family education;Energy conservation   PT Next Visit Plan Neuromuscular reeducation for balance & gait with cane or single UE support. Instruct how to perform activities at home as frequency is now 1x/wk.    Consulted and Agree with Plan of Care Patient      Patient will benefit from skilled therapeutic intervention in order to improve the following deficits and impairments:  Decreased activity tolerance, Decreased balance, Decreased mobility, Decreased knowledge of use  of DME, Decreased endurance, Decreased strength, Impaired flexibility, Impaired tone  Visit Diagnosis: Other symptoms and signs involving the nervous system  Unsteadiness on feet  Other abnormalities of gait and mobility  Muscle weakness (generalized)  Ataxic gait     Problem List Patient Active Problem List   Diagnosis Date Noted  . Abnormal laboratory test 09/09/2016  . Gait abnormality 08/03/2016  . Weakness 08/03/2016  . Paresthesia 08/03/2016  . S/P cervical spinal fusion 10/23/2015  . Hypersomnia 12/08/2010  . 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  . EPIGASTRIC PAIN 10/13/2009  . IRRITABLE BOWEL SYNDROME 03/10/2009  . BACK PAIN, LUMBAR 07/01/2008  . COSTOCHONDRITIS 02/01/2008  . VIRAL URI  01/08/2008  . CHEST PAIN 01/08/2008  . 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 ARM PAIN 12/25/2007  . ANEMIA-NOS 10/02/2007  . Essential hypertension 10/02/2007  . GERD 10/02/2007  . POLYP, GALLBLADDER 10/02/2007  . UTERINE POLYP 10/02/2007  . OSTEOARTHRITIS 10/02/2007  . Dyskinesia 10/02/2007  . HEADACHE 10/02/2007  . PALPITATIONS 10/02/2007  . CARDIAC MURMUR, HX OF 10/02/2007  . NEPHROLITHIASIS, HX OF 10/02/2007    Jamey Reas PT, DPT 01/19/2017, 8:44 AM  [facility] [address] [place], [place], [address] Phone: [phone]   Fax:  [phone]  Name: Amy Barry MRN: [id number] Date of Birth: 06-Nov-1970

## 2017-01-23 ENCOUNTER — Ambulatory Visit: Payer: 59 | Admitting: Physical Therapy

## 2017-01-25 ENCOUNTER — Ambulatory Visit: Payer: 59 | Admitting: Physical Therapy

## 2017-01-25 ENCOUNTER — Encounter: Payer: Self-pay | Admitting: Physical Therapy

## 2017-01-25 VITALS — BP 131/82 | HR 85

## 2017-01-25 DIAGNOSIS — R260 Ataxic gait: Secondary | ICD-10-CM

## 2017-01-25 DIAGNOSIS — R279 Unspecified lack of coordination: Secondary | ICD-10-CM

## 2017-01-25 DIAGNOSIS — R2689 Other abnormalities of gait and mobility: Secondary | ICD-10-CM

## 2017-01-25 DIAGNOSIS — R29818 Other symptoms and signs involving the nervous system: Secondary | ICD-10-CM | POA: Diagnosis not present

## 2017-01-25 DIAGNOSIS — M6281 Muscle weakness (generalized): Secondary | ICD-10-CM

## 2017-01-25 DIAGNOSIS — R2681 Unsteadiness on feet: Secondary | ICD-10-CM

## 2017-01-25 DIAGNOSIS — Z9181 History of falling: Secondary | ICD-10-CM

## 2017-01-25 NOTE — Therapy (Signed)
[facility] [address] [place], [place], [address] Phone: [phone]   Fax:  [phone]  Physical Therapy Treatment  Patient Details  Name: AYLANIE CUBILLOS MRN: [id number] Date of Birth: 07/15/1970 Referring Provider: Donald Prose MD   Encounter Date: 01/25/2017      PT End of Session - 01/25/17 1649    Visit Number 34   Number of Visits 45   Date for PT Re-Evaluation 04/14/17   Authorization Type [employer]    Authorization - Visit Number 34   Authorization - Number of Visits 60   PT Start Time 6967   PT Stop Time 1615   PT Time Calculation (min) 40 min   Activity Tolerance Patient limited by fatigue   Behavior During Therapy [facility] for tasks assessed/performed      Past Medical History:  Diagnosis Date  . Anemia   . Dysrhythmia    pvc's holter monitor started lopressor- no return visit  . GERD (gastroesophageal reflux disease)   . Headache(784.0)   . Heart murmur    child no echo  . History of kidney stones   . Hypertension   . IBS (irritable bowel syndrome)   . Low vitamin D level   . Lower extremity weakness   . Neuromuscular disorder ([place])   . Numbness and tingling   . Osteoarthritis   . Pneumonia    January- tx with IV antibiotics  for pneumonia in 04/2015   . PVC (premature ventricular contraction)   . Seizure disorder ([place])    denied seizures- has dyskinesia  . Sleep concern 2013   toldf that she doesn 't have apnea but she stays sleeping a lot  . Upper extremity weakness     Past Surgical History:  Procedure Laterality Date  . 2 ovarian cysts     age 62  . CERVICAL DISC ARTHROPLASTY N/A 10/23/2015   Procedure: Cervical Disc Arthroplasty Cervical six-seven;  Surgeon: Eustace Moore, MD;  Location: [facility];  Service: Neurosurgery;  Laterality: N/A;  . ESOPHAGOGASTRODUODENOSCOPY     for GERD  . LAPAROSCOPIC ABDOMINAL EXPLORATION     dx ibs  . TUBAL LIGATION      Vitals:   01/25/17 1544  BP: 131/82  Pulse: 85        Subjective Assessment - 01/25/17 1546    Subjective Pt reports having a bad virus last week and having to miss her appointment earlier this week.  Pt reports due to virus she has had increased pain, weakness and fatigue.  Continues to experience malaise today but fever has resolved.   Limitations Sitting;Lifting;Standing;Walking;House hold activities   Patient Stated Goals sitting upright, walking, driving, cooking, cleaning, return to work, walk the dog    Pain Score 8    Pain Location Generalized   Pain Descriptors / Indicators Aching;Sore   Pain Type Chronic pain   Pain Onset More than a month ago   Pain Frequency Constant                         [facility] Adult PT Treatment/Exercise - 01/25/17 1552      Knee/Hip Exercises: Aerobic   Nustep Level 4 resistance with bilat UE and LE x 8 min + 4 minutes with LE only (arms at 7, seat at 6, resistance at 4)             Balance Exercises - 01/25/17 1648  Balance Exercises: Standing   Tandem Gait Forward;Upper extremity support;4 reps  // bars with min A to shift weight forwards over stance LE   Other Standing Exercises Braiding to L and R in // bars x 2 reps each with min A and cues to maintain COG forwards over BOS             PT Short Term Goals - 01/18/17 1820      PT SHORT TERM GOAL #1   Title Patient verbalizes and demonstrates understanding of updated HEP. (All STGs Target Date 02/17/17)   Time 4   Period Weeks   Status On-going   Target Date 02/17/17     PT SHORT TERM GOAL #2   Title Patient maintains upright for 1 minute without UE support with supervision.    Time 4   Period Weeks   Status New   Target Date 02/17/17     PT SHORT TERM GOAL #3   Title Patient ambulates 200' with cane with minA.    Time 4   Period Weeks   Status New   Target Date 02/17/17     PT SHORT TERM GOAL #4   Title Patient negotiates ramp & curb with cane with  modA.    Time 4   Period Weeks   Status New   Target Date 02/17/17           PT Long Term Goals - 01/18/17 2221      PT LONG TERM GOAL #1   Title Patient will verbalize understanding and return demonstration for ongoing HEP.  (All LTGs Target Date: 04/14/17)   Baseline 01/18/17 progressing PT continues to update as condition improves.    Time 12   Period Weeks   Status On-going   Target Date 04/14/17     PT LONG TERM GOAL #2   Title Patient maintains upright without UE support for 2 minutes.    Time 12   Period Weeks   Status New   Target Date 04/14/17     PT LONG TERM GOAL #3   Title Patient reaches 10", scans environment, picks up object from floor with cane or less support safely modified independent.    Time 12   Period Weeks   Status New   Target Date 04/14/17     PT LONG TERM GOAL #4   Title Patient negotiates stairs with 1 rail reciprocal pattern modified independent.   Time 12   Period Weeks   Status New   Target Date 04/14/17     PT LONG TERM GOAL #5   Title Timed Up & Go with cane or less <25 sec.    Time 12   Period Weeks   Status New   Target Date 04/14/17     PT LONG TERM GOAL #6   Title Patient will improvem FOTO score by >/= 10% to indicate improvement in self-perceived functional mobility.   Time 12   Period Weeks   Status On-going   Target Date 04/14/17     PT LONG TERM GOAL #7   Title Patient ambulates 500' with cane or less modified independent.    Time 12   Period Weeks   Status Revised   Target Date 04/14/17     PT LONG TERM GOAL #8   Title Patient negotiates ramps, curbs & stairs with cane or less modified independent.    Time 12   Period Weeks   Status Revised   Target Date 04/14/17  PT LONG TERM GOAL  #9   TITLE Berg Balance >36/56    Time 12   Period Weeks   Status On-going   Target Date 04/14/17               Plan - 01/25/17 1649    Clinical Impression Statement Due to significant malaise today pt  engaged in seated endurance on Nustep and then performed dynamic balance and coordination exercises in // bars for increased UE support with multiple sitting rest breaks. Pt is demonstrating improvement with endurance-able to tolerate > resistance for greater length of time on Nustep today.  Pt also noted to have increased ankle stability during tandem gait and braiding without air cast.  Will continue to progress as pt tolerates.   Rehab Potential Good   Clinical Impairments Affecting Rehab Potential HTN, cervical spondylosis, C6-7 L side disc protrusion causing L foraminal impingement, anemia, functional neurological disorder, anemia     PT Frequency 1x / week   PT Duration 12 weeks   PT Treatment/Interventions ADLs/Self Care Home Management;Electrical Stimulation;Neuromuscular re-education;Balance training;Therapeutic exercise;Therapeutic activities;Functional mobility training;Stair training;Gait training;DME Instruction;Patient/family education;Energy conservation   PT Next Visit Plan Neuromuscular reeducation for balance & gait with cane or single UE support. Instruct how to perform activities at home as frequency is now 1x/wk.    Consulted and Agree with Plan of Care Patient      Patient will benefit from skilled therapeutic intervention in order to improve the following deficits and impairments:  Decreased activity tolerance, Decreased balance, Decreased mobility, Decreased knowledge of use of DME, Decreased endurance, Decreased strength, Impaired flexibility, Impaired tone  Visit Diagnosis: Other symptoms and signs involving the nervous system  Unsteadiness on feet  Other abnormalities of gait and mobility  Muscle weakness (generalized)  Ataxic gait  Unspecified lack of coordination  History of falling     Problem List Patient Active Problem List   Diagnosis Date Noted  . Abnormal laboratory test 09/09/2016  . Gait abnormality 08/03/2016  . Weakness 08/03/2016  .  Paresthesia 08/03/2016  . S/P cervical spinal fusion 10/23/2015  . Hypersomnia 12/08/2010  . 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  . EPIGASTRIC PAIN 10/13/2009  . IRRITABLE BOWEL SYNDROME 03/10/2009  . BACK PAIN, LUMBAR 07/01/2008  . COSTOCHONDRITIS 02/01/2008  . VIRAL URI 01/08/2008  . CHEST PAIN 01/08/2008  . 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 ARM PAIN 12/25/2007  . ANEMIA-NOS 10/02/2007  . Essential hypertension 10/02/2007  . GERD 10/02/2007  . POLYP, GALLBLADDER 10/02/2007  . UTERINE POLYP 10/02/2007  . OSTEOARTHRITIS 10/02/2007  . Dyskinesia 10/02/2007  . HEADACHE 10/02/2007  . PALPITATIONS 10/02/2007  . CARDIAC MURMUR, HX OF 10/02/2007  . NEPHROLITHIASIS, HX OF 10/02/2007   Rico Junker, PT, DPT 01/25/17    4:52 PM    [facility] [address] [place], [place], [address] Phone: [phone]   Fax:  [phone]  Name: LENIA HOUSLEY MRN: [id number] Date of Birth: 08/19/1970

## 2017-01-30 ENCOUNTER — Ambulatory Visit: Payer: 59 | Admitting: Physical Therapy

## 2017-01-30 ENCOUNTER — Encounter: Payer: Self-pay | Admitting: Physical Therapy

## 2017-01-30 DIAGNOSIS — R29818 Other symptoms and signs involving the nervous system: Secondary | ICD-10-CM | POA: Diagnosis not present

## 2017-01-30 DIAGNOSIS — M6281 Muscle weakness (generalized): Secondary | ICD-10-CM

## 2017-01-30 DIAGNOSIS — R2689 Other abnormalities of gait and mobility: Secondary | ICD-10-CM

## 2017-01-30 DIAGNOSIS — R2681 Unsteadiness on feet: Secondary | ICD-10-CM

## 2017-01-30 DIAGNOSIS — R260 Ataxic gait: Secondary | ICD-10-CM

## 2017-01-30 DIAGNOSIS — R279 Unspecified lack of coordination: Secondary | ICD-10-CM

## 2017-01-31 NOTE — Therapy (Signed)
[facility] [address] [place], [place], [address] Phone: [phone]   Fax:  [phone]  Physical Therapy Treatment  Patient Details  Name: Amy Barry MRN: [id number] Date of Birth: 10-31-1970 Referring Provider: Donald Prose MD   Encounter Date: 01/30/2017      PT End of Session - 01/30/17 1751    Visit Number 35   Number of Visits 45   Date for PT Re-Evaluation 04/14/17   Authorization Type [employer]    Authorization - Visit Number 35   Authorization - Number of Visits 60   PT Start Time 6962   PT Stop Time 1615   PT Time Calculation (min) 45 min   Equipment Utilized During Treatment Gait belt   Activity Tolerance Patient limited by fatigue   Behavior During Therapy WFL for tasks assessed/performed      Past Medical History:  Diagnosis Date  . Anemia   . Dysrhythmia    pvc's holter monitor started lopressor- no return visit  . GERD (gastroesophageal reflux disease)   . Headache(784.0)   . Heart murmur    child no echo  . History of kidney stones   . Hypertension   . IBS (irritable bowel syndrome)   . Low vitamin D level   . Lower extremity weakness   . Neuromuscular disorder ([place])   . Numbness and tingling   . Osteoarthritis   . Pneumonia    January- tx with IV antibiotics  for pneumonia in 04/2015   . PVC (premature ventricular contraction)   . Seizure disorder ([place])    denied seizures- has dyskinesia  . Sleep concern 2013   toldf that she doesn 't have apnea but she stays sleeping a lot  . Upper extremity weakness     Past Surgical History:  Procedure Laterality Date  . 2 ovarian cysts     age 75  . CERVICAL DISC ARTHROPLASTY N/A 10/23/2015   Procedure: Cervical Disc Arthroplasty Cervical six-seven;  Surgeon: Eustace Moore, MD;  Location: [facility];  Service: Neurosurgery;  Laterality: N/A;  . ESOPHAGOGASTRODUODENOSCOPY     for GERD  . LAPAROSCOPIC ABDOMINAL EXPLORATION      dx ibs  . TUBAL LIGATION      There were no vitals filed for this visit.      Subjective Assessment - 01/30/17 1522    Subjective She is only able to sit 1-2 hrs 1x/day for simulated office then pain increases to high level all over. She is questioning if seeing a rheumatologist that she saw previously years ago is a good idea.    Limitations Sitting;Lifting;Standing;Walking;House hold activities   How long can you sit comfortably? 3 hrs prior to onset of pain    Patient Stated Goals sitting upright, walking, driving, cooking, cleaning, return to work, walk the dog    Currently in Pain? Yes   Pain Score 7   in last 5 days, worst 10/10, best 5/10   Pain Location Generalized  end of day, below knees is most intense   Pain Orientation Right;Left   Pain Descriptors / Indicators Burning;Aching;Throbbing   Pain Type Chronic pain   Pain Onset More than a month ago   Pain Frequency Constant   Aggravating Factors  unknown, sitting, waking up in middle of night     Neuromuscular Re-education: Seated on 65cm ball with chairs laterally and PT manual assist posteriorly, mirror & rollator locked anteriorly. PT alternated activities between UEs & LEs: flexion/  reaching to rollator handle, dorsiflexion, abduction /reaching to chair back laterally, plantarflexion, cervical rotation, cervical flexion/extension, anterior/posterior lumbar rolls, right /left lumbar rolls, small circles CW & CCW. All with manual assist for stabilization. Standing with back, shoulders, head on door frame with rollator anterior: reach overhead single UE & BUEs with 10 sec stretch deep breathing.    Gait Training with rollator Sudd[place] Pt ambulated at beginning of session 100' from lobby to gym with minA with ataxia with bilateral knee flexion that patient is able to stop buckling to floor; advancement with steppage, ataxic movement. She required minA for trunk stabilization. Pt ambulated at end of session 150' from gym to  car parked under awning with minA with gait pattern noted above. No difference noted after above neuromuscular re-education.   Patient reports will continue to work towards returning to simulated desk activities.  She also plans to call rheumatologist for consult appointment.                              PT Short Term Goals - 01/18/17 1820      PT SHORT TERM GOAL #1   Title Patient verbalizes and demonstrates understanding of updated HEP. (All STGs Target Date 02/17/17)   Time 4   Period Weeks   Status On-going   Target Date 02/17/17     PT SHORT TERM GOAL #2   Title Patient maintains upright for 1 minute without UE support with supervision.    Time 4   Period Weeks   Status New   Target Date 02/17/17     PT SHORT TERM GOAL #3   Title Patient ambulates 200' with cane with minA.    Time 4   Period Weeks   Status New   Target Date 02/17/17     PT SHORT TERM GOAL #4   Title Patient negotiates ramp & curb with cane with modA.    Time 4   Period Weeks   Status New   Target Date 02/17/17           PT Long Term Goals - 01/18/17 2221      PT LONG TERM GOAL #1   Title Patient will verbalize understanding and return demonstration for ongoing HEP.  (All LTGs Target Date: 04/14/17)   Baseline 01/18/17 progressing PT continues to update as condition improves.    Time 12   Period Weeks   Status On-going   Target Date 04/14/17     PT LONG TERM GOAL #2   Title Patient maintains upright without UE support for 2 minutes.    Time 12   Period Weeks   Status New   Target Date 04/14/17     PT LONG TERM GOAL #3   Title Patient reaches 10", scans environment, picks up object from floor with cane or less support safely modified independent.    Time 12   Period Weeks   Status New   Target Date 04/14/17     PT LONG TERM GOAL #4   Title Patient negotiates stairs with 1 rail reciprocal pattern modified independent.   Time 12   Period Weeks   Status New    Target Date 04/14/17     PT LONG TERM GOAL #5   Title Timed Up & Go with cane or less <25 sec.    Time 12   Period Weeks   Status New   Target Date 04/14/17  PT LONG TERM GOAL #6   Title Patient will improvem FOTO score by >/= 10% to indicate improvement in self-perceived functional mobility.   Time 12   Period Weeks   Status On-going   Target Date 04/14/17     PT LONG TERM GOAL #7   Title Patient ambulates 500' with cane or less modified independent.    Time 12   Period Weeks   Status Revised   Target Date 04/14/17     PT LONG TERM GOAL #8   Title Patient negotiates ramps, curbs & stairs with cane or less modified independent.    Time 12   Period Weeks   Status Revised   Target Date 04/14/17     PT LONG TERM GOAL  #9   TITLE Berg Balance >36/56    Time 12   Period Weeks   Status On-going   Target Date 04/14/17               Plan - 01/30/17 1752    Clinical Impression Statement Patient continues with increased ataxia since recent viral issue. She was able to improve control of movements in sitting & supported standing. But gait with rollator had less control of LEs & core stability.    Rehab Potential Good   Clinical Impairments Affecting Rehab Potential HTN, cervical spondylosis, C6-7 L side disc protrusion causing L foraminal impingement, anemia, functional neurological disorder, anemia     PT Frequency 1x / week   PT Duration 12 weeks   PT Treatment/Interventions ADLs/Self Care Home Management;Electrical Stimulation;Neuromuscular re-education;Balance training;Therapeutic exercise;Therapeutic activities;Functional mobility training;Stair training;Gait training;DME Instruction;Patient/family education;Energy conservation   PT Next Visit Plan Neuromuscular reeducation for balance & gait with cane or single UE support. Instruct how to perform activities at home as frequency is now 1x/wk.    Consulted and Agree with Plan of Care Patient      Patient will  benefit from skilled therapeutic intervention in order to improve the following deficits and impairments:  Decreased activity tolerance, Decreased balance, Decreased mobility, Decreased knowledge of use of DME, Decreased endurance, Decreased strength, Impaired flexibility, Impaired tone  Visit Diagnosis: Other symptoms and signs involving the nervous system  Unsteadiness on feet  Other abnormalities of gait and mobility  Muscle weakness (generalized)  Ataxic gait  Unspecified lack of coordination     Problem List Patient Active Problem List   Diagnosis Date Noted  . Abnormal laboratory test 09/09/2016  . Gait abnormality 08/03/2016  . Weakness 08/03/2016  . Paresthesia 08/03/2016  . S/P cervical spinal fusion 10/23/2015  . Hypersomnia 12/08/2010  . 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  . EPIGASTRIC PAIN 10/13/2009  . IRRITABLE BOWEL SYNDROME 03/10/2009  . BACK PAIN, LUMBAR 07/01/2008  . COSTOCHONDRITIS 02/01/2008  . VIRAL URI 01/08/2008  . CHEST PAIN 01/08/2008  . 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 ARM PAIN 12/25/2007  . ANEMIA-NOS 10/02/2007  . Essential hypertension 10/02/2007  . GERD 10/02/2007  . POLYP, GALLBLADDER 10/02/2007  . UTERINE POLYP 10/02/2007  . OSTEOARTHRITIS 10/02/2007  . Dyskinesia 10/02/2007  . HEADACHE 10/02/2007  . PALPITATIONS 10/02/2007  . CARDIAC MURMUR, HX OF 10/02/2007  . NEPHROLITHIASIS, HX OF 10/02/2007    Jamey Reas PT, DPT 01/31/2017, 7:55 AM  [facility] [address] [place], [place], [address] Phone: [phone]   Fax:  [phone]  Name: Amy Barry MRN: [id number] Date of Birth: [birth year]

## 2017-02-01 ENCOUNTER — Encounter: Payer: Self-pay | Admitting: Physical Therapy

## 2017-02-01 ENCOUNTER — Ambulatory Visit: Payer: 59 | Admitting: Physical Therapy

## 2017-02-01 DIAGNOSIS — R2689 Other abnormalities of gait and mobility: Secondary | ICD-10-CM

## 2017-02-01 DIAGNOSIS — R29818 Other symptoms and signs involving the nervous system: Secondary | ICD-10-CM | POA: Diagnosis not present

## 2017-02-01 DIAGNOSIS — R260 Ataxic gait: Secondary | ICD-10-CM

## 2017-02-01 DIAGNOSIS — M6281 Muscle weakness (generalized): Secondary | ICD-10-CM

## 2017-02-01 DIAGNOSIS — R2681 Unsteadiness on feet: Secondary | ICD-10-CM

## 2017-02-02 NOTE — Therapy (Signed)
[facility] [address] [place], [place], [address] Phone: [phone]   Fax:  [phone]  Physical Therapy Treatment  Patient Details  Name: Amy Barry MRN: [id number] Date of Birth: [birth year] Referring Provider: Donald Prose MD   Encounter Date: 02/01/2017      PT End of Session - 02/01/17 2023    Visit Number 36   Number of Visits 45   Date for PT Re-Evaluation 04/14/17   Authorization Type [employer]    Authorization - Visit Number 30   Authorization - Number of Visits 60   PT Start Time 0277   PT Stop Time 1610   PT Time Calculation (min) 40 min   Equipment Utilized During Treatment Gait belt   Activity Tolerance Patient limited by fatigue;Patient limited by pain   Behavior During Therapy WFL for tasks assessed/performed      Past Medical History:  Diagnosis Date  . Anemia   . Dysrhythmia    pvc's holter monitor started lopressor- no return visit  . GERD (gastroesophageal reflux disease)   . Headache(784.0)   . Heart murmur    child no echo  . History of kidney stones   . Hypertension   . IBS (irritable bowel syndrome)   . Low vitamin D level   . Lower extremity weakness   . Neuromuscular disorder ([place])   . Numbness and tingling   . Osteoarthritis   . Pneumonia    January- tx with IV antibiotics  for pneumonia in 04/2015   . PVC (premature ventricular contraction)   . Seizure disorder ([place])    denied seizures- has dyskinesia  . Sleep concern 2013   toldf that she doesn 't have apnea but she stays sleeping a lot  . Upper extremity weakness     Past Surgical History:  Procedure Laterality Date  . 2 ovarian cysts     age 46  . CERVICAL DISC ARTHROPLASTY N/A 10/23/2015   Procedure: Cervical Disc Arthroplasty Cervical six-seven;  Surgeon: Eustace Moore, MD;  Location: [facility];  Service: Neurosurgery;  Laterality: N/A;  . ESOPHAGOGASTRODUODENOSCOPY     for GERD  . LAPAROSCOPIC  ABDOMINAL EXPLORATION     dx ibs  . TUBAL LIGATION      There were no vitals filed for this visit.      Subjective Assessment - 02/01/17 1539    Subjective She sat yesterday from 7:30am to 6:00pm with 1 hr to 3hr periods. Her pain was 8-9/10 and took advil last night & hot bath this morning. Pain at lunch today 6/10   Pertinent History     Limitations Sitting;Lifting;Standing;Walking;House hold activities   How long can you sit comfortably? 3 hrs prior to onset of pain    Patient Stated Goals sitting upright, walking, driving, cooking, cleaning, return to work, walk the dog    Currently in Pain? Yes   Pain Score 6    Pain Location Generalized   Pain Orientation Right;Left   Pain Descriptors / Indicators Burning;Aching;Throbbing   Pain Type Chronic pain   Pain Onset More than a month ago   Pain Frequency Constant   Aggravating Factors  unknown, sitting too long       Gait Training: Pt ambulated from outside to gym 150' with rollator [place] with supervision.  Pt ambulated 20' X 2 and 100' X 2 with SBQC LUE & hand hold RUE with modA and verbal, tactile cues on sequence, core stability and weight shift.  Neuromuscular Re-education: Standing with SBQC support LUE and tactile, verbal cues on core stability: turning head to look side to side and up/down, reaching for object 2-4" anteriorly.                           PT Education - 02/01/17 1530    Education provided Yes   Education Details sitting posture working on sitting endurance progressing time   [employer]) Educated Patient   Methods Explanation;Demonstration;Verbal cues   Comprehension Verbalized understanding          PT Short Term Goals - 01/18/17 1820      PT SHORT TERM GOAL #1   Title Patient verbalizes and demonstrates understanding of updated HEP. (All STGs Target Date 02/17/17)   Time 4   Period Weeks   Status On-going   Target Date 02/17/17     PT SHORT TERM GOAL #2   Title Patient  maintains upright for 1 minute without UE support with supervision.    Time 4   Period Weeks   Status New   Target Date 02/17/17     PT SHORT TERM GOAL #3   Title Patient ambulates 200' with cane with minA.    Time 4   Period Weeks   Status New   Target Date 02/17/17     PT SHORT TERM GOAL #4   Title Patient negotiates ramp & curb with cane with modA.    Time 4   Period Weeks   Status New   Target Date 02/17/17           PT Long Term Goals - 01/18/17 2221      PT LONG TERM GOAL #1   Title Patient will verbalize understanding and return demonstration for ongoing HEP.  (All LTGs Target Date: 04/14/17)   Baseline 01/18/17 progressing PT continues to update as condition improves.    Time 12   Period Weeks   Status On-going   Target Date 04/14/17     PT LONG TERM GOAL #2   Title Patient maintains upright without UE support for 2 minutes.    Time 12   Period Weeks   Status New   Target Date 04/14/17     PT LONG TERM GOAL #3   Title Patient reaches 10", scans environment, picks up object from floor with cane or less support safely modified independent.    Time 12   Period Weeks   Status New   Target Date 04/14/17     PT LONG TERM GOAL #4   Title Patient negotiates stairs with 1 rail reciprocal pattern modified independent.   Time 12   Period Weeks   Status New   Target Date 04/14/17     PT LONG TERM GOAL #5   Title Timed Up & Go with cane or less <25 sec.    Time 12   Period Weeks   Status New   Target Date 04/14/17     PT LONG TERM GOAL #6   Title Patient will improvem FOTO score by >/= 10% to indicate improvement in self-perceived functional mobility.   Time 12   Period Weeks   Status On-going   Target Date 04/14/17     PT LONG TERM GOAL #7   Title Patient ambulates 500' with cane or less modified independent.    Time 12   Period Weeks   Status Revised   Target Date 04/14/17     PT LONG  TERM GOAL #8   Title Patient negotiates ramps, curbs &  stairs with cane or less modified independent.    Time 12   Period Weeks   Status Revised   Target Date 04/14/17     PT LONG TERM GOAL  #9   TITLE Berg Balance >36/56    Time 12   Period Weeks   Status On-going   Target Date 04/14/17               Plan - 02/01/17 2024    Clinical Impression Statement PT was able to progress gait to LUE [facility] but requires hand hold assist RUE. She improved LE advancement with verbal & tactile cues on technique.    Rehab Potential Good   Clinical Impairments Affecting Rehab Potential HTN, cervical spondylosis, C6-7 L side disc protrusion causing L foraminal impingement, anemia, functional neurological disorder, anemia     PT Frequency 1x / week   PT Duration 12 weeks   PT Treatment/Interventions ADLs/Self Care Home Management;Electrical Stimulation;Neuromuscular re-education;Balance training;Therapeutic exercise;Therapeutic activities;Functional mobility training;Stair training;Gait training;DME Instruction;Patient/family education;Energy conservation   PT Next Visit Plan Neuromuscular reeducation for balance & gait with cane or single UE support. Instruct how to perform activities at home as frequency is now 1x/wk.    Consulted and Agree with Plan of Care Patient      Patient will benefit from skilled therapeutic intervention in order to improve the following deficits and impairments:  Decreased activity tolerance, Decreased balance, Decreased mobility, Decreased knowledge of use of DME, Decreased endurance, Decreased strength, Impaired flexibility, Impaired tone  Visit Diagnosis: Other symptoms and signs involving the nervous system  Unsteadiness on feet  Other abnormalities of gait and mobility  Muscle weakness (generalized)  Ataxic gait     Problem List Patient Active Problem List   Diagnosis Date Noted  . Abnormal laboratory test 09/09/2016  . Gait abnormality 08/03/2016  . Weakness 08/03/2016  . Paresthesia 08/03/2016  .  S/P cervical spinal fusion 10/23/2015  . Hypersomnia 12/08/2010  . 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  . EPIGASTRIC PAIN 10/13/2009  . IRRITABLE BOWEL SYNDROME 03/10/2009  . BACK PAIN, LUMBAR 07/01/2008  . COSTOCHONDRITIS 02/01/2008  . VIRAL URI 01/08/2008  . CHEST PAIN 01/08/2008  . 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 ARM PAIN 12/25/2007  . ANEMIA-NOS 10/02/2007  . Essential hypertension 10/02/2007  . GERD 10/02/2007  . POLYP, GALLBLADDER 10/02/2007  . UTERINE POLYP 10/02/2007  . OSTEOARTHRITIS 10/02/2007  . Dyskinesia 10/02/2007  . HEADACHE 10/02/2007  . PALPITATIONS 10/02/2007  . CARDIAC MURMUR, HX OF 10/02/2007  . NEPHROLITHIASIS, HX OF 10/02/2007    Jamey Reas PT, DPT 02/02/2017, 12:27 AM  [employer] [address] [place], [place], [address] Phone: [phone]   Fax:  [phone]  Name: KALAYSIA DEMONBREUN MRN: [id number] Date of Birth: 1971-01-17

## 2017-02-13 ENCOUNTER — Encounter: Payer: Self-pay | Admitting: Physical Therapy

## 2017-02-13 ENCOUNTER — Ambulatory Visit: Payer: 59 | Attending: Family Medicine | Admitting: Physical Therapy

## 2017-02-13 DIAGNOSIS — R260 Ataxic gait: Secondary | ICD-10-CM

## 2017-02-13 DIAGNOSIS — Z9181 History of falling: Secondary | ICD-10-CM | POA: Diagnosis present

## 2017-02-13 DIAGNOSIS — R29818 Other symptoms and signs involving the nervous system: Secondary | ICD-10-CM | POA: Diagnosis not present

## 2017-02-13 DIAGNOSIS — R279 Unspecified lack of coordination: Secondary | ICD-10-CM | POA: Diagnosis present

## 2017-02-13 DIAGNOSIS — R2681 Unsteadiness on feet: Secondary | ICD-10-CM | POA: Diagnosis not present

## 2017-02-13 DIAGNOSIS — M6281 Muscle weakness (generalized): Secondary | ICD-10-CM | POA: Insufficient documentation

## 2017-02-13 DIAGNOSIS — R2689 Other abnormalities of gait and mobility: Secondary | ICD-10-CM | POA: Diagnosis not present

## 2017-02-13 NOTE — Therapy (Signed)
[facility] [address] [place], [place], [address] Phone: [phone]   Fax:  [phone]  Physical Therapy Treatment  Patient Details  Name: Amy Barry MRN: [id number] Date of Birth: [birth year] Referring Provider: Donald Prose MD    Encounter Date: 02/13/2017  PT End of Session - 02/13/17 1630    Visit Number  37    Number of Visits  45    Date for PT Re-Evaluation  04/14/17    Authorization Type  [employer]     Authorization - Visit Number  31    Authorization - Number of Visits  60    PT Start Time  6015    PT Stop Time  1530    PT Time Calculation (min)  45 min    Activity Tolerance  Patient limited by fatigue;Patient limited by pain    Behavior During Therapy  [facility] for tasks assessed/performed       Past Medical History:  Diagnosis Date  . Anemia   . Dysrhythmia    pvc's holter monitor started lopressor- no return visit  . GERD (gastroesophageal reflux disease)   . Headache(784.0)   . Heart murmur    child no echo  . History of kidney stones   . Hypertension   . IBS (irritable bowel syndrome)   . Low vitamin D level   . Lower extremity weakness   . Neuromuscular disorder ([place])   . Numbness and tingling   . Osteoarthritis   . Pneumonia    January- tx with IV antibiotics  for pneumonia in 04/2015   . PVC (premature ventricular contraction)   . Seizure disorder ([place])    denied seizures- has dyskinesia  . Sleep concern 2013   toldf that she doesn 't have apnea but she stays sleeping a lot  . Upper extremity weakness     Past Surgical History:  Procedure Laterality Date  . 2 ovarian cysts     age 46  . ESOPHAGOGASTRODUODENOSCOPY     for GERD  . LAPAROSCOPIC ABDOMINAL EXPLORATION     dx ibs  . TUBAL LIGATION      There were no vitals filed for this visit.  Subjective Assessment - 02/13/17 1454    Subjective  Over the weekend sat at wrestling match from 9-3pm and then on Sunday at  church for 3 hours but then had to go home and take a nap.  Tried an anti-depressant for pain and depression for 3 days but it caused her to feel more depressed and "foggy."  Stopped taking it last night.  Feels more clear but pain has returned, 8/10.  Goes on a cruise next week.    Pertinent History       Limitations  Sitting;Lifting;Standing;Walking;House hold activities    How long can you sit comfortably?  3 hrs prior to onset of pain     Patient Stated Goals  sitting upright, walking, driving, cooking, cleaning, return to work, walk the dog     Currently in Pain?  Yes    Pain Score  8     Pain Location  Generalized    Pain Descriptors / Indicators  Aching;Burning;Throbbing    Pain Type  Chronic pain    Pain Onset  More than a month ago                      [facility] Adult PT Treatment/Exercise - 02/13/17 1522      Ambulation/Gait  Ambulation/Gait  Yes    Ambulation/Gait Assistance  4: Min assist    Ambulation/Gait Assistance Details  gait over level surfaces with SPC with therapist providing min HHA on LUE with improved balance and coordination of gait sequence with cane but continues to present with foot slapping and decreased heel strike at terminal swing.  One episode of knee IR and flexion moment with mod A to recover    Ambulation Distance (Feet)  200 Feet    Assistive device  Straight cane    Gait Pattern  Step-through pattern;Right steppage;Left steppage;Right genu recurvatum;Left genu recurvatum;Ataxic;Lateral hip instability;Trunk flexed    Ambulation Surface  Level;Indoor    Ramp  4: Min assist    Ramp Details (indicate cue type and reason)  with SPC    Curb  4: Min assist    Curb Details (indicate cue type and reason)  with SPC      Dynamic Standing Balance   Reaching for objects comments:  Standing x 1 minute without UE support - UE performing small ball toss      Self-Care   Self-Care  Other Self-Care Comments    Other Self-Care Comments   Discussed  recommendations for travel down to [place] [place] for cruise and once on ship.  Pt asking if she should take w/c; discussed with pt use of rollator seat for rest breaks but calling ahead to see if they would be able to let her board before regular guests due to physical limitations and to see if they have a shuttle that will take her up the long ramp onto the boat.  Pt has a goal to be able to walk out in the sand on the beach with her daughters' assistance.  Also discussed getting into the pool to perform some exercises.  Will continue to discuss prior to trip on the 28th.      Neuro Re-ed    Neuro Re-ed Details   foot on half foam roller performing forwards and backward stepping x 3-4 reps each LE to mimic normal heel > toe gait sequence with one UE support and tactile cues for more trunk control             PT Education - 02/13/17 1629    Education provided  Yes    Education Details  STG achievement, travel recommendations, gait with cane    Person(s) Educated  Patient    Methods  Explanation    Comprehension  Verbalized understanding       PT Short Term Goals - 02/13/17 1507      PT SHORT TERM GOAL #1   Title  Patient verbalizes and demonstrates understanding of updated HEP. (All STGs Target Date 02/17/17)    Baseline  Will assess at next visit 11/20    Time  4    Period  Weeks    Status  On-going      PT SHORT TERM GOAL #2   Title  Patient maintains upright for 1 minute without UE support with supervision.     Baseline  intermittent min A for balance x 1 min due to LOB    Time  4    Period  Weeks    Status  Partially Met      PT SHORT TERM GOAL #3   Title  Patient ambulates 200' with cane with minA.     Baseline  MET 11/12 on level surface, min HHA    Time  4    Period  Weeks  Status  Achieved      PT SHORT TERM GOAL #4   Title  Patient negotiates ramp & curb with cane with modA.     Baseline  MET 11/12     Period  Weeks    Status  Achieved        PT Long Term Goals -  01/18/17 2221      PT LONG TERM GOAL #1   Title  Patient will verbalize understanding and return demonstration for ongoing HEP.  (All LTGs Target Date: 04/14/17)    Baseline  01/18/17 progressing PT continues to update as condition improves.     Time  12    Period  Weeks    Status  On-going    Target Date  04/14/17      PT LONG TERM GOAL #2   Title  Patient maintains upright without UE support for 2 minutes.     Time  12    Period  Weeks    Status  New    Target Date  04/14/17      PT LONG TERM GOAL #3   Title  Patient reaches 10", scans environment, picks up object from floor with cane or less support safely modified independent.     Time  12    Period  Weeks    Status  New    Target Date  04/14/17      PT LONG TERM GOAL #4   Title  Patient negotiates stairs with 1 rail reciprocal pattern modified independent.    Time  12    Period  Weeks    Status  New    Target Date  04/14/17      PT LONG TERM GOAL #5   Title  Timed Up & Go with cane or less <25 sec.     Time  12    Period  Weeks    Status  New    Target Date  04/14/17      PT LONG TERM GOAL #6   Title  Patient will improvem FOTO score by >/= 10% to indicate improvement in self-perceived functional mobility.    Time  12    Period  Weeks    Status  On-going    Target Date  04/14/17      PT LONG TERM GOAL #7   Title  Patient ambulates 500' with cane or less modified independent.     Time  12    Period  Weeks    Status  Revised    Target Date  04/14/17      PT LONG TERM GOAL #8   Title  Patient negotiates ramps, curbs & stairs with cane or less modified independent.     Time  12    Period  Weeks    Status  Revised    Target Date  04/14/17      PT LONG TERM GOAL  #9   TITLE  Berg Balance >36/56     Time  12    Period  Weeks    Status  On-going    Target Date  04/14/17            Plan - 02/13/17 1521    Clinical Impression Statement  Treatment session today initiated assessment of STG.  Will  assess STG #1 (HEP) at next session - will review all current exercises and determine if exercises need to be D/C, upgraded or downgraded.  Will also add pool exercises to  do while on cruise.  Pt partially met STG #2 - required intermittent min A for standing balance without UE support x 1 minute but met STG #3 and 4 performing gait with cane over level surfaces, short ramp and curb with min-mod A.  Continued NMR and gait sequence training.  Will continue to address and progress as pt tolerates.    Rehab Potential  Good    Clinical Impairments Affecting Rehab Potential  HTN, cervical spondylosis, C6-7 L side disc protrusion causing L foraminal impingement, anemia, functional neurological disorder, anemia      PT Frequency  1x / week    PT Duration  12 weeks    PT Treatment/Interventions  ADLs/Self Care Home Management;Electrical Stimulation;Neuromuscular re-education;Balance training;Therapeutic exercise;Therapeutic activities;Functional mobility training;Stair training;Gait training;DME Instruction;Patient/family education;Energy conservation    PT Next Visit Plan  Finish STG #1 by reviewing and updating HEP; add pool exercises she can do on cruise.  Gait over level sufaces, ramp, curb, stairs with cane    Consulted and Agree with Plan of Care  Patient       Patient will benefit from skilled therapeutic intervention in order to improve the following deficits and impairments:  Decreased activity tolerance, Decreased balance, Decreased mobility, Decreased knowledge of use of DME, Decreased endurance, Decreased strength, Impaired flexibility, Impaired tone  Visit Diagnosis: Other symptoms and signs involving the nervous system  Unsteadiness on feet  Other abnormalities of gait and mobility  Muscle weakness (generalized)  Ataxic gait  Unspecified lack of coordination  History of falling     Problem List Patient Active Problem List   Diagnosis Date Noted  . Abnormal laboratory test  09/09/2016  . Gait abnormality 08/03/2016  . Weakness 08/03/2016  . Paresthesia 08/03/2016  . S/P cervical spinal fusion 10/23/2015  . Hypersomnia 12/08/2010  . 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  . EPIGASTRIC PAIN 10/13/2009  . IRRITABLE BOWEL SYNDROME 03/10/2009  . BACK PAIN, LUMBAR 07/01/2008  . COSTOCHONDRITIS 02/01/2008  . VIRAL URI 01/08/2008  . CHEST PAIN 01/08/2008  . 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 ARM PAIN 12/25/2007  . ANEMIA-NOS 10/02/2007  . Essential hypertension 10/02/2007  . GERD 10/02/2007  . POLYP, GALLBLADDER 10/02/2007  . UTERINE POLYP 10/02/2007  . OSTEOARTHRITIS 10/02/2007  . Dyskinesia 10/02/2007  . HEADACHE 10/02/2007  . PALPITATIONS 10/02/2007  . CARDIAC MURMUR, HX OF 10/02/2007  . NEPHROLITHIASIS, HX OF 10/02/2007    Rico Junker, PT, DPT 02/13/17    4:36 PM    [facility] [address] [place], [place], [address] Phone: [phone]   Fax:  [phone]  Name: Amy Barry MRN: [id number] Date of Birth: July 11, 1970

## 2017-02-17 DIAGNOSIS — R531 Weakness: Secondary | ICD-10-CM | POA: Diagnosis not present

## 2017-02-17 DIAGNOSIS — R29898 Other symptoms and signs involving the musculoskeletal system: Secondary | ICD-10-CM | POA: Diagnosis not present

## 2017-02-21 ENCOUNTER — Ambulatory Visit: Payer: 59 | Admitting: Physical Therapy

## 2017-02-21 ENCOUNTER — Encounter: Payer: Self-pay | Admitting: Physical Therapy

## 2017-02-21 DIAGNOSIS — R260 Ataxic gait: Secondary | ICD-10-CM

## 2017-02-21 DIAGNOSIS — R2689 Other abnormalities of gait and mobility: Secondary | ICD-10-CM

## 2017-02-21 DIAGNOSIS — R29818 Other symptoms and signs involving the nervous system: Secondary | ICD-10-CM

## 2017-02-21 DIAGNOSIS — R2681 Unsteadiness on feet: Secondary | ICD-10-CM

## 2017-02-21 DIAGNOSIS — M6281 Muscle weakness (generalized): Secondary | ICD-10-CM

## 2017-02-21 NOTE — Patient Instructions (Addendum)
Chin Tuck and Chest Lift Against [place] with back against wall. Pull shoulders back and toward wall while pulling head straight back. Keep jaw and eyes level. Hold ___ seconds. Perform ___ reps.  Copyright  VHI. All rights reserved.  Side Stepping    In vertical position, move sideways by lifting arms and moving one leg out to side. Then pull arms down and legs together. Perform ___ laps leading with left leg. Perform ___ laps leading with right leg.  Copyright  VHI. All rights reserved.  Side Stepping Lunge    Lunge to side, bending hip and knee to 90. Keep other leg straight. Return to stand, bringing straight leg to other leg. Perform ___ laps side stepping right. Perform ___ laps side stepping left.  Copyright  VHI. All rights reserved.  Side Stepping With Semi-Squat and Upper Extremity Abduction    Step to side, opening arms and legs while lowering to semi-squat position keeping heels on ground. Returning to stand, bring arms down, legs together. Perform ___ laps side stepping right. Perform ___ laps side stepping left.  Copyright  VHI. All rights reserved.  March    Lift one knee toward chest. Straighten leg while lifting other knee. Move opposite arm and leg together. March ____ minutes per session. Do ____ sessions per week.  Copyright  VHI. All rights reserved.  Backward Walk    Step backward with one leg. Strike pool bottom with front of foot. Rolling back onto foot, bring other leg backward. Session: Walk ____ minutes. Do ____ sessions per week. Arm movement: Swing, elbows straight (UEP-1) Reverse breaststroke (UEP-8) Figure eight (UEP-6)  Copyright  VHI. All rights reserved.  Arm Cross    Sit or stand, with arms straight out to sides, parallel to floor. Slowly cross arms in front. Slowly return to starting position. Repeat ____ times.  Copyright  VHI. All rights reserved.  Breast Stroke Arms (Walking, Lightly Jogging, or  Standing Still)    Inhaling, straighten arms forward at shoulder level. Exhaling, turn palms outward and pull water back, keeping elbows slightly bent. Stroke ___ minutes. Do ___ times per day.  Copyright  VHI. All rights reserved.  Breaststroke    Straighten arms in front of body at chest level, thumbs down. Pull arms apart and out from midline. Bend elbows and move hands to chest, thumbs down.   Copyright  VHI. All rights reserved.  Crawlstroke    Move one arm up to water level and forward until elbow is almost straight, palm down. At the same time, move other arm down and past body, elbow slightly bent, palm down.   Copyright  VHI. All rights reserved.  Reverse Breaststroke    Straighten arms out from sides at chest level, thumbs up. With arms straight, bring palms together. Bend elbows and move hands to chest, thumbs up.   Copyright  VHI. All rights reserved.

## 2017-02-22 NOTE — Therapy (Signed)
[facility] [address] [place], [place], [address] Phone: [phone]   Fax:  [phone]  Physical Therapy Treatment  Patient Details  Name: Amy Barry MRN: [id number] Date of Birth: [birth year] Referring Provider: Donald Prose MD    Encounter Date: 02/21/2017  PT End of Session - 02/21/17 1838    Visit Number  38    Number of Visits  45    Date for PT Re-Evaluation  04/14/17    Authorization Type  [employer] - Visit Number  69    Authorization - Number of Visits  60    PT Start Time  1450    PT Stop Time  1535    PT Time Calculation (min)  45 min    Activity Tolerance  Patient limited by fatigue;Patient limited by pain    Behavior During Therapy  [facility] for tasks assessed/performed       Past Medical History:  Diagnosis Date  . Anemia   . Dysrhythmia    pvc's holter monitor started lopressor- no return visit  . GERD (gastroesophageal reflux disease)   . Headache(784.0)   . Heart murmur    child no echo  . History of kidney stones   . Hypertension   . IBS (irritable bowel syndrome)   . Low vitamin D level   . Lower extremity weakness   . Neuromuscular disorder ([place])   . Numbness and tingling   . Osteoarthritis   . Pneumonia    January- tx with IV antibiotics  for pneumonia in 04/2015   . PVC (premature ventricular contraction)   . Seizure disorder ([place])    denied seizures- has dyskinesia  . Sleep concern 2013   toldf that she doesn 't have apnea but she stays sleeping a lot  . Upper extremity weakness     Past Surgical History:  Procedure Laterality Date  . 2 ovarian cysts     age 46  . CERVICAL DISC ARTHROPLASTY N/A 10/23/2015   Procedure: Cervical Disc Arthroplasty Cervical six-seven;  Surgeon: Eustace Moore, MD;  Location: [facility];  Service: Neurosurgery;  Laterality: N/A;  . ESOPHAGOGASTRODUODENOSCOPY     for GERD  . LAPAROSCOPIC ABDOMINAL EXPLORATION     dx  ibs  . TUBAL LIGATION      There were no vitals filed for this visit.  Subjective Assessment - 02/21/17 1458    Subjective  She has been sitting at desk for 2-3 hours ~2 x/day. She is going back to back to work on modified level. Family is going on cruise and she wants to get in pool to exercise.     Limitations  Sitting;Lifting;Standing;Walking;House hold activities    How long can you sit comfortably?  3 hrs prior to onset of pain     Patient Stated Goals  sitting upright, walking, driving, cooking, cleaning, return to work, walk the dog     Currently in Pain?  Yes    Pain Score  6     Pain Location  Back thoracic to lumbar    Pain Orientation  Posterior;Lower    Pain Descriptors / Indicators  Aching;Burning;Throbbing    Pain Type  Chronic pain    Pain Onset  More than a month ago    Pain Frequency  Constant      Self-Care: Pt reports she initially is returning work 6 hrs/day with 5-10 minute break each hour 4 days /wk. She plans to  go downstairs to get a drink during breaks.  PT recommended staying on same level as negotiating stairs in <10 minutes could cause fatigue, balance loss with rushing, etc. PT suggested considering a dorm size refrigerator with water bottles & healthy snacks in office. She should use breaks to stand, stretch and only walk shorter distances on same floor of home (works from home with office on 2nd floor). Pt verbalized understanding. She is continuing to work on increasing sitting tolerance using posture & support like elbows on armrests.  PT demo floor transfer using locked rollator pushed against wall to stabilize it. Pt verbalized better understanding.   Therapeutic Exercise: Pt verbalized understanding of updated HEP with balance near support like locked rollator or counter. PT gave handout with demo, verbal cues on exercises that she could perform in pool on vacation. PT demo how a pool lift works to help get in/out of pool and if not present ask if they  have a portable lift. She verbalized understanding.   Gait Training: Pt ambulated 100' X 2 with 2 canes with 4 point pattern to facilitate counter trunk rotation, upright posture & step through pattern.                          PT Short Term Goals - 02/21/17 1841      PT SHORT TERM GOAL #1   Title  Patient verbalizes and demonstrates understanding of updated HEP. (All STGs Target Date 02/17/17)    Baseline  MET 02/21/2017    Time  4    Period  Weeks    Status  Achieved      PT SHORT TERM GOAL #2   Title  Patient maintains upright for 1 minute without UE support with supervision.     Baseline  intermittent min A for balance x 1 min due to LOB    Time  4    Period  Weeks    Status  Partially Met      PT SHORT TERM GOAL #3   Title  Patient ambulates 200' with cane with minA.     Baseline  MET 11/12 on level surface, min HHA    Time  4    Period  Weeks    Status  Achieved      PT SHORT TERM GOAL #4   Title  Patient negotiates ramp & curb with cane with modA.     Baseline  MET 11/12     Period  Weeks    Status  Achieved        PT Long Term Goals - 01/18/17 2221      PT LONG TERM GOAL #1   Title  Patient will verbalize understanding and return demonstration for ongoing HEP.  (All LTGs Target Date: 04/14/17)    Baseline  01/18/17 progressing PT continues to update as condition improves.     Time  12    Period  Weeks    Status  On-going    Target Date  04/14/17      PT LONG TERM GOAL #2   Title  Patient maintains upright without UE support for 2 minutes.     Time  12    Period  Weeks    Status  New    Target Date  04/14/17      PT LONG TERM GOAL #3   Title  Patient reaches 10", scans environment, picks up object from floor with cane or less  support safely modified independent.     Time  12    Period  Weeks    Status  New    Target Date  04/14/17      PT LONG TERM GOAL #4   Title  Patient negotiates stairs with 1 rail reciprocal pattern  modified independent.    Time  12    Period  Weeks    Status  New    Target Date  04/14/17      PT LONG TERM GOAL #5   Title  Timed Up & Go with cane or less <25 sec.     Time  12    Period  Weeks    Status  New    Target Date  04/14/17      PT LONG TERM GOAL #6   Title  Patient will improvem FOTO score by >/= 10% to indicate improvement in self-perceived functional mobility.    Time  12    Period  Weeks    Status  On-going    Target Date  04/14/17      PT LONG TERM GOAL #7   Title  Patient ambulates 500' with cane or less modified independent.     Time  12    Period  Weeks    Status  Revised    Target Date  04/14/17      PT LONG TERM GOAL #8   Title  Patient negotiates ramps, curbs & stairs with cane or less modified independent.     Time  12    Period  Weeks    Status  Revised    Target Date  04/14/17      PT LONG TERM GOAL  #9   TITLE  Berg Balance >36/56     Time  12    Period  Weeks    Status  On-going    Target Date  04/14/17            Plan - 02/21/17 1842    Clinical Impression Statement  Patient reports better understanding of exercises for pool exercises that she can perform on cruise. She verbalizes recommendations for not having to go between floors during breaks at return to work.     Rehab Potential  Good    Clinical Impairments Affecting Rehab Potential  HTN, cervical spondylosis, C6-7 L side disc protrusion causing L foraminal impingement, anemia, functional neurological disorder, anemia      PT Frequency  1x / week    PT Duration  12 weeks    PT Treatment/Interventions  ADLs/Self Care Home Management;Electrical Stimulation;Neuromuscular re-education;Balance training;Therapeutic exercise;Therapeutic activities;Functional mobility training;Stair training;Gait training;DME Instruction;Patient/family education;Energy conservation    PT Next Visit Plan  Gait over level sufaces, ramp, curb, stairs with cane. standing balance with minimal to no UE  support.     Consulted and Agree with Plan of Care  Patient       Patient will benefit from skilled therapeutic intervention in order to improve the following deficits and impairments:  Decreased activity tolerance, Decreased balance, Decreased mobility, Decreased knowledge of use of DME, Decreased endurance, Decreased strength, Impaired flexibility, Impaired tone  Visit Diagnosis: Other symptoms and signs involving the nervous system  Unsteadiness on feet  Other abnormalities of gait and mobility  Muscle weakness (generalized)  Ataxic gait     Problem List Patient Active Problem List   Diagnosis Date Noted  . Abnormal laboratory test 09/09/2016  . Gait abnormality 08/03/2016  . Weakness 08/03/2016  .  Paresthesia 08/03/2016  . S/P cervical spinal fusion 10/23/2015  . Hypersomnia 12/08/2010  . 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  . EPIGASTRIC PAIN 10/13/2009  . IRRITABLE BOWEL SYNDROME 03/10/2009  . BACK PAIN, LUMBAR 07/01/2008  . COSTOCHONDRITIS 02/01/2008  . VIRAL URI 01/08/2008  . CHEST PAIN 01/08/2008  . 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 ARM PAIN 12/25/2007  . ANEMIA-NOS 10/02/2007  . Essential hypertension 10/02/2007  . GERD 10/02/2007  . POLYP, GALLBLADDER 10/02/2007  . UTERINE POLYP 10/02/2007  . OSTEOARTHRITIS 10/02/2007  . Dyskinesia 10/02/2007  . HEADACHE 10/02/2007  . PALPITATIONS 10/02/2007  . CARDIAC MURMUR, HX OF 10/02/2007  . NEPHROLITHIASIS, HX OF 10/02/2007    Jamey Reas PT, DPT 02/22/2017, 3:05 PM  [facility] [address] [place], [place], [address] Phone: [phone]   Fax:  [phone]  Name: EMILIANA BLAIZE MRN: [id number] Date of Birth: [birth year]

## 2017-02-27 ENCOUNTER — Encounter: Payer: Self-pay | Admitting: Physical Therapy

## 2017-02-27 ENCOUNTER — Ambulatory Visit: Payer: 59 | Admitting: Physical Therapy

## 2017-02-27 DIAGNOSIS — R260 Ataxic gait: Secondary | ICD-10-CM

## 2017-02-27 DIAGNOSIS — R29818 Other symptoms and signs involving the nervous system: Secondary | ICD-10-CM | POA: Diagnosis not present

## 2017-02-27 DIAGNOSIS — R2681 Unsteadiness on feet: Secondary | ICD-10-CM

## 2017-02-27 DIAGNOSIS — Z9181 History of falling: Secondary | ICD-10-CM

## 2017-02-27 DIAGNOSIS — R2689 Other abnormalities of gait and mobility: Secondary | ICD-10-CM

## 2017-02-27 DIAGNOSIS — M6281 Muscle weakness (generalized): Secondary | ICD-10-CM

## 2017-02-27 DIAGNOSIS — R279 Unspecified lack of coordination: Secondary | ICD-10-CM

## 2017-02-27 NOTE — Therapy (Signed)
[facility] [address] [place], [place], [address] Phone: [phone]   Fax:  [phone]  Physical Therapy Treatment  Patient Details  Name: Amy Barry MRN: [id number] Date of Birth: 22-Aug-1970 Referring Provider: Donald Prose MD    Encounter Date: 02/27/2017  PT End of Session - 02/27/17 1639    Visit Number  39    Number of Visits  45    Date for PT Re-Evaluation  04/14/17    Authorization Type  [employer]     Authorization - Visit Number  16    Authorization - Number of Visits  60    PT Start Time  1540    PT Stop Time  1620    PT Time Calculation (min)  40 min    Activity Tolerance  Patient tolerated treatment well    Behavior During Therapy  [facility] for tasks assessed/performed       Past Medical History:  Diagnosis Date  . Anemia   . Dysrhythmia    pvc's holter monitor started lopressor- no return visit  . GERD (gastroesophageal reflux disease)   . Headache(784.0)   . Heart murmur    child no echo  . History of kidney stones   . Hypertension   . IBS (irritable bowel syndrome)   . Low vitamin D level   . Lower extremity weakness   . Neuromuscular disorder ([place])   . Numbness and tingling   . Osteoarthritis   . Pneumonia    January- tx with IV antibiotics  for pneumonia in 04/2015   . PVC (premature ventricular contraction)   . Seizure disorder ([place])    denied seizures- has dyskinesia  . Sleep concern 2013   toldf that she doesn 't have apnea but she stays sleeping a lot  . Upper extremity weakness     Past Surgical History:  Procedure Laterality Date  . 2 ovarian cysts     age 20  . CERVICAL DISC ARTHROPLASTY N/A 10/23/2015   Procedure: Cervical Disc Arthroplasty Cervical six-seven;  Surgeon: Eustace Moore, MD;  Location: [facility];  Service: Neurosurgery;  Laterality: N/A;  . ESOPHAGOGASTRODUODENOSCOPY     for GERD  . LAPAROSCOPIC ABDOMINAL EXPLORATION     dx ibs  . TUBAL  LIGATION      There were no vitals filed for this visit.  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  In between activities pt mostly slept.  Moderate pain today.  Leaves Wednesday for cruise.      Limitations  Sitting;Lifting;Standing;Walking;House hold activities    How long can you sit comfortably?  3 hrs prior to onset of pain     Patient Stated Goals  sitting upright, walking, driving, cooking, cleaning, return to work, walk the dog     Pain Score  6     Pain Location  Back    Pain Orientation  Lower    Pain Descriptors / Indicators  Aching    Pain Onset  More than a month ago           [facility] Adult PT Treatment/Exercise - 02/27/17 1550      Ambulation/Gait   Ambulation/Gait  Yes    Ambulation/Gait Assistance  4: Min assist    Ambulation/Gait Assistance Details  with therapist providing trunk control and verbal cues  for heel strike at initial stance to improve knee control and more normal gait sequence.  Decreased genu recurvatum and buckling noted when pt actively engaging ankle DF during gait.      Ambulation Distance (Feet)  115 Feet + 100 feet to waiting area at end of session    Assistive device  Straight cane    Gait Pattern  Step-through pattern;Right steppage;Left steppage;Right genu recurvatum;Left genu recurvatum;Ataxic;Lateral hip instability;Trunk flexed    Ambulation Surface  Level;Indoor      Knee/Hip Exercises: Aerobic   Tread Mill  at 0.5 mph at 6% incline to simulate walking up ramp on cruise ship with min A for trunk control/stability for 0.04 of a mile before pt fatigued          Balance Exercises - 02/27/17 1613      OTAGO PROGRAM   Tandem Walk  Support 4 laps UE support on counter top    Heel Walking  Support x 4 reps along counter        PT Education - 02/27/17 1639    Education provided  Yes     Education Details  continued to discuss travel recommendations    Person(s) Educated  Patient    Methods  Explanation    Comprehension  Verbalized understanding       PT Short Term Goals - 02/21/17 1841      PT SHORT TERM GOAL #1   Title  Patient verbalizes and demonstrates understanding of updated HEP. (All STGs Target Date 02/17/17)    Baseline  MET 02/21/2017    Time  4    Period  Weeks    Status  Achieved      PT SHORT TERM GOAL #2   Title  Patient maintains upright for 1 minute without UE support with supervision.     Baseline  intermittent min A for balance x 1 min due to LOB    Time  4    Period  Weeks    Status  Partially Met      PT SHORT TERM GOAL #3   Title  Patient ambulates 200' with cane with minA.     Baseline  MET 11/12 on level surface, min HHA    Time  4    Period  Weeks    Status  Achieved      PT SHORT TERM GOAL #4   Title  Patient negotiates ramp & curb with cane with modA.     Baseline  MET 11/12     Period  Weeks    Status  Achieved        PT Long Term Goals - 01/18/17 2221      PT LONG TERM GOAL #1   Title  Patient will verbalize understanding and return demonstration for ongoing HEP.  (All LTGs Target Date: 04/14/17)    Baseline  01/18/17 progressing PT continues to update as condition improves.     Time  12    Period  Weeks    Status  On-going    Target Date  04/14/17      PT LONG TERM GOAL #2   Title  Patient maintains upright without UE support for 2 minutes.     Time  12    Period  Weeks    Status  New    Target Date  04/14/17      PT LONG TERM GOAL #3   Title  Patient reaches 10", scans environment, picks up object from floor  with cane or less support safely modified independent.     Time  12    Period  Weeks    Status  New    Target Date  04/14/17      PT LONG TERM GOAL #4   Title  Patient negotiates stairs with 1 rail reciprocal pattern modified independent.    Time  12    Period  Weeks    Status  New    Target Date   04/14/17      PT LONG TERM GOAL #5   Title  Timed Up & Go with cane or less <25 sec.     Time  12    Period  Weeks    Status  New    Target Date  04/14/17      PT LONG TERM GOAL #6   Title  Patient will improvem FOTO score by >/= 10% to indicate improvement in self-perceived functional mobility.    Time  12    Period  Weeks    Status  On-going    Target Date  04/14/17      PT LONG TERM GOAL #7   Title  Patient ambulates 500' with cane or less modified independent.     Time  12    Period  Weeks    Status  Revised    Target Date  04/14/17      PT LONG TERM GOAL #8   Title  Patient negotiates ramps, curbs & stairs with cane or less modified independent.     Time  12    Period  Weeks    Status  Revised    Target Date  04/14/17      PT LONG TERM GOAL  #9   TITLE  Berg Balance >36/56     Time  12    Period  Weeks    Status  On-going    Target Date  04/14/17            Plan - 02/27/17 1640    Clinical Impression Statement  Pt leaves for cruise Wednesday and will have to walk up ramp onto ship.  Performed gait training on treadmill at 6% incline to simulate walking long distance uphill; continued dynamic gait, balance and coordination training at countertop to decrease UE support and then over ground with cane.  Pt tolerated well with minor fatigue; will continue to address and progress towards LTG.    Rehab Potential  Good    Clinical Impairments Affecting Rehab Potential  HTN, cervical spondylosis, C6-7 L side disc protrusion causing L foraminal impingement, anemia, functional neurological disorder, anemia      PT Frequency  1x / week    PT Duration  12 weeks    PT Treatment/Interventions  ADLs/Self Care Home Management;Electrical Stimulation;Neuromuscular re-education;Balance training;Therapeutic exercise;Therapeutic activities;Functional mobility training;Stair training;Gait training;DME Instruction;Patient/family education;Energy conservation    PT Next Visit Plan   Gait over level sufaces, ramp, curb, stairs with cane. standing balance with minimal to no UE support, heel walking/tandem walking working on heel-toe    Consulted and Agree with Plan of Care  Patient       Patient will benefit from skilled therapeutic intervention in order to improve the following deficits and impairments:  Decreased activity tolerance, Decreased balance, Decreased mobility, Decreased knowledge of use of DME, Decreased endurance, Decreased strength, Impaired flexibility, Impaired tone  Visit Diagnosis: Other symptoms and signs involving the nervous system  Unsteadiness on feet  Other abnormalities of gait  and mobility  Muscle weakness (generalized)  Ataxic gait  Unspecified lack of coordination  History of falling     Problem List Patient Active Problem List   Diagnosis Date Noted  . Abnormal laboratory test 09/09/2016  . Gait abnormality 08/03/2016  . Weakness 08/03/2016  . Paresthesia 08/03/2016  . S/P cervical spinal fusion 10/23/2015  . Hypersomnia 12/08/2010  . 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  . EPIGASTRIC PAIN 10/13/2009  . IRRITABLE BOWEL SYNDROME 03/10/2009  . BACK PAIN, LUMBAR 07/01/2008  . COSTOCHONDRITIS 02/01/2008  . VIRAL URI 01/08/2008  . CHEST PAIN 01/08/2008  . 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 ARM PAIN 12/25/2007  . ANEMIA-NOS 10/02/2007  . Essential hypertension 10/02/2007  . GERD 10/02/2007  . POLYP, GALLBLADDER 10/02/2007  . UTERINE POLYP 10/02/2007  . OSTEOARTHRITIS 10/02/2007  . Dyskinesia 10/02/2007  . HEADACHE 10/02/2007  . PALPITATIONS 10/02/2007  . CARDIAC MURMUR, HX OF 10/02/2007  . NEPHROLITHIASIS, HX OF 10/02/2007    Rico Junker, PT, DPT 02/27/17    4:51 PM    [place] [address] [place], [place], [address] Phone: [phone]   Fax:  [phone]  Name: KYAH BUESING MRN: [id number] Date of Birth: 20-Dec-1970

## 2017-03-06 ENCOUNTER — Ambulatory Visit: Payer: 59 | Admitting: Physical Therapy

## 2017-03-10 DIAGNOSIS — H40053 Ocular hypertension, bilateral: Secondary | ICD-10-CM | POA: Diagnosis not present

## 2017-03-10 DIAGNOSIS — H40013 Open angle with borderline findings, low risk, bilateral: Secondary | ICD-10-CM | POA: Diagnosis not present

## 2017-03-10 DIAGNOSIS — H524 Presbyopia: Secondary | ICD-10-CM | POA: Diagnosis not present

## 2017-03-13 ENCOUNTER — Ambulatory Visit: Payer: 59 | Admitting: Physical Therapy

## 2017-03-19 DIAGNOSIS — R531 Weakness: Secondary | ICD-10-CM | POA: Diagnosis not present

## 2017-03-19 DIAGNOSIS — R29898 Other symptoms and signs involving the musculoskeletal system: Secondary | ICD-10-CM | POA: Diagnosis not present

## 2017-03-20 ENCOUNTER — Encounter: Payer: Self-pay | Admitting: Physical Therapy

## 2017-03-20 ENCOUNTER — Ambulatory Visit: Payer: 59 | Attending: Family Medicine | Admitting: Physical Therapy

## 2017-03-20 DIAGNOSIS — R279 Unspecified lack of coordination: Secondary | ICD-10-CM | POA: Insufficient documentation

## 2017-03-20 DIAGNOSIS — R29818 Other symptoms and signs involving the nervous system: Secondary | ICD-10-CM | POA: Diagnosis not present

## 2017-03-20 DIAGNOSIS — R260 Ataxic gait: Secondary | ICD-10-CM | POA: Diagnosis present

## 2017-03-20 DIAGNOSIS — Z9181 History of falling: Secondary | ICD-10-CM | POA: Diagnosis present

## 2017-03-20 DIAGNOSIS — R2689 Other abnormalities of gait and mobility: Secondary | ICD-10-CM

## 2017-03-20 DIAGNOSIS — R2681 Unsteadiness on feet: Secondary | ICD-10-CM | POA: Diagnosis not present

## 2017-03-20 DIAGNOSIS — M6281 Muscle weakness (generalized): Secondary | ICD-10-CM | POA: Insufficient documentation

## 2017-03-20 NOTE — Therapy (Signed)
[facility] [address] [place], [place], [address] Phone: [phone]   Fax:  [phone]  Physical Therapy Treatment  Patient Details  Name: Amy Barry MRN: [id number] Date of Birth: [birth year] Referring Provider: Donald Prose MD    Encounter Date: 03/20/2017  PT End of Session - 03/20/17 1646    Visit Number  40    Number of Visits  45    Date for PT Re-Evaluation  04/14/17    Authorization Type  [employer]     Authorization - Visit Number  40    Authorization - Number of Visits  60    PT Start Time  7106    PT Stop Time  1620    PT Time Calculation (min)  43 min    Activity Tolerance  Patient tolerated treatment well    Behavior During Therapy  [facility] for tasks assessed/performed       Past Medical History:  Diagnosis Date  . Anemia   . Dysrhythmia    pvc's holter monitor started lopressor- no return visit  . GERD (gastroesophageal reflux disease)   . Headache(784.0)   . Heart murmur    child no echo  . History of kidney stones   . Hypertension   . IBS (irritable bowel syndrome)   . Low vitamin D level   . Lower extremity weakness   . Neuromuscular disorder ([place])   . Numbness and tingling   . Osteoarthritis   . Pneumonia    January- tx with IV antibiotics  for pneumonia in 04/2015   . PVC (premature ventricular contraction)   . Seizure disorder ([place])    denied seizures- has dyskinesia  . Sleep concern 2013   toldf that she doesn 't have apnea but she stays sleeping a lot  . Upper extremity weakness     Past Surgical History:  Procedure Laterality Date  . 2 ovarian cysts     age 54  . CERVICAL DISC ARTHROPLASTY N/A 10/23/2015   Procedure: Cervical Disc Arthroplasty Cervical six-seven;  Surgeon: Eustace Moore, MD;  Location: [place] [facility];  Service: Neurosurgery;  Laterality: N/A;  . ESOPHAGOGASTRODUODENOSCOPY     for GERD  . LAPAROSCOPIC ABDOMINAL EXPLORATION     dx ibs  . TUBAL  LIGATION      There were no vitals filed for this visit.  Subjective Assessment - 03/20/17 1538    Subjective  Went on the cruise; was not able to walk up ramp and had to be pushed in rollator.  Was not able to get into pool to do exercises but did walk around the ship.  Needed extra help to walk out onto the beach.  Since she has been home she has been resting a lot but did make an elaborate birthday cake for her daughter.  Did a lot of sitting at church yesterday.  Has been in a lot of pain overall.  Friend recommended that pt also see a "structural chiropractor" in [place].      Limitations  Sitting;Lifting;Standing;Walking;House hold activities    How long can you sit comfortably?  --    Patient Stated Goals  sitting upright, walking, driving, cooking, cleaning, return to work, walk the dog     Currently in Pain?  Yes    Pain Score  8     Pain Location  Generalized    Pain Descriptors / Indicators  Burning;Aching    Pain Type  Chronic pain  Pain Onset  More than a month ago                      [facility] Adult PT Treatment/Exercise - 03/20/17 1600      Ambulation/Gait   Ambulation/Gait  Yes    Ambulation/Gait Assistance  4: Min assist    Ambulation/Gait Assistance Details  around track for general training with cane; side stepping and retro gait over level surface with cane; stepping over obstacles forwards and then laterally x 4 reps x 2 sets with cane    Ambulation Distance (Feet)  115 Feet x 2    Assistive device  Straight cane    Gait Pattern  Step-through pattern;Right steppage;Left steppage;Right genu recurvatum;Left genu recurvatum;Ataxic;Lateral hip instability;Trunk flexed    Ambulation Surface  Level;Indoor    Stairs  Yes    Stairs Assistance  5: Supervision    Stairs Assistance Details (indicate cue type and reason)  SLS, weight shift and LE strengthening with alteranting sequence    Stair Management Technique  One rail Right;Alternating  pattern;Forwards;With cane    Number of Stairs  12    Height of Stairs  6             PT Education - 03/20/17 1645    Education provided  Yes    Education Details  use of transport chair when traveling in the future; gait training with cane; cleared pt to purchase cane and quad tip and begin short distances at home with family min A    Person(s) Educated  Patient    Methods  Explanation    Comprehension  Verbalized understanding       PT Short Term Goals - 02/21/17 1841      PT SHORT TERM GOAL #1   Title  Patient verbalizes and demonstrates understanding of updated HEP. (All STGs Target Date 02/17/17)    Baseline  MET 02/21/2017    Time  4    Period  Weeks    Status  Achieved      PT SHORT TERM GOAL #2   Title  Patient maintains upright for 1 minute without UE support with supervision.     Baseline  intermittent min A for balance x 1 min due to LOB    Time  4    Period  Weeks    Status  Partially Met      PT SHORT TERM GOAL #3   Title  Patient ambulates 200' with cane with minA.     Baseline  MET 11/12 on level surface, min HHA    Time  4    Period  Weeks    Status  Achieved      PT SHORT TERM GOAL #4   Title  Patient negotiates ramp & curb with cane with modA.     Baseline  MET 11/12     Period  Weeks    Status  Achieved        PT Long Term Goals - 01/18/17 2221      PT LONG TERM GOAL #1   Title  Patient will verbalize understanding and return demonstration for ongoing HEP.  (All LTGs Target Date: 04/14/17)    Baseline  01/18/17 progressing PT continues to update as condition improves.     Time  12    Period  Weeks    Status  On-going    Target Date  04/14/17      PT LONG TERM GOAL #2  Title  Patient maintains upright without UE support for 2 minutes.     Time  12    Period  Weeks    Status  New    Target Date  04/14/17      PT LONG TERM GOAL #3   Title  Patient reaches 10", scans environment, picks up object from floor with cane or less support  safely modified independent.     Time  12    Period  Weeks    Status  New    Target Date  04/14/17      PT LONG TERM GOAL #4   Title  Patient negotiates stairs with 1 rail reciprocal pattern modified independent.    Time  12    Period  Weeks    Status  New    Target Date  04/14/17      PT LONG TERM GOAL #5   Title  Timed Up & Go with cane or less <25 sec.     Time  12    Period  Weeks    Status  New    Target Date  04/14/17      PT LONG TERM GOAL #6   Title  Patient will improvem FOTO score by >/= 10% to indicate improvement in self-perceived functional mobility.    Time  12    Period  Weeks    Status  On-going    Target Date  04/14/17      PT LONG TERM GOAL #7   Title  Patient ambulates 500' with cane or less modified independent.     Time  12    Period  Weeks    Status  Revised    Target Date  04/14/17      PT LONG TERM GOAL #8   Title  Patient negotiates ramps, curbs & stairs with cane or less modified independent.     Time  12    Period  Weeks    Status  Revised    Target Date  04/14/17      PT LONG TERM GOAL  #9   TITLE  Berg Balance >36/56     Time  12    Period  Weeks    Status  On-going    Target Date  04/14/17            Plan - 03/20/17 1649    Clinical Impression Statement  Despite increased pain today pt able to tolerate further gait and stair negotiation training with cane with quad tip.  Pt demonstrated improved coordination and balance with cane today overall and was able to negotiate stairs with cane and one rail with supervision, alternating sequence.  Performed higher level gait training with cane including side stepping, retro stepping, stepping forwards and laterally over obstacles.  Pt cleared to purchase and begin to practice with cane at home.    Rehab Potential  Good    Clinical Impairments Affecting Rehab Potential  HTN, cervical spondylosis, C6-7 L side disc protrusion causing L foraminal impingement, anemia, functional neurological  disorder, anemia      PT Frequency  1x / week    PT Duration  12 weeks    PT Treatment/Interventions  ADLs/Self Care Home Management;Electrical Stimulation;Neuromuscular re-education;Balance training;Therapeutic exercise;Therapeutic activities;Functional mobility training;Stair training;Gait training;DME Instruction;Patient/family education;Energy conservation    PT Next Visit Plan  gait training with cane over obstacles, outside over pavement.  Discuss if [employer] membeship would be in their budget-could use [employer], pool, etc.  Trial elliptical; With Shirlean Mylar or Vivianne Carles: trial with [employer]    Consulted and Agree with Plan of Care  Patient       Patient will benefit from skilled therapeutic intervention in order to improve the following deficits and impairments:  Decreased activity tolerance, Decreased balance, Decreased mobility, Decreased knowledge of use of DME, Decreased endurance, Decreased strength, Impaired flexibility, Impaired tone  Visit Diagnosis: Other symptoms and signs involving the nervous system  Unsteadiness on feet  Other abnormalities of gait and mobility  Muscle weakness (generalized)  Ataxic gait  Unspecified lack of coordination  History of falling     Problem List Patient Active Problem List   Diagnosis Date Noted  . Abnormal laboratory test 09/09/2016  . Gait abnormality 08/03/2016  . Weakness 08/03/2016  . Paresthesia 08/03/2016  . S/P cervical spinal fusion 10/23/2015  . Hypersomnia 12/08/2010  . 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  . EPIGASTRIC PAIN 10/13/2009  . IRRITABLE BOWEL SYNDROME 03/10/2009  . BACK PAIN, LUMBAR 07/01/2008  . COSTOCHONDRITIS 02/01/2008  . VIRAL URI 01/08/2008  . CHEST PAIN 01/08/2008  . 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 ARM PAIN 12/25/2007  . ANEMIA-NOS 10/02/2007  . Essential hypertension 10/02/2007  . GERD 10/02/2007  . POLYP, GALLBLADDER 10/02/2007  . UTERINE POLYP 10/02/2007  . OSTEOARTHRITIS 10/02/2007   . Dyskinesia 10/02/2007  . HEADACHE 10/02/2007  . PALPITATIONS 10/02/2007  . CARDIAC MURMUR, HX OF 10/02/2007  . NEPHROLITHIASIS, HX OF 10/02/2007   Rico Junker, PT, DPT 03/20/17    4:53 PM    [place] [address] [place], [place], [address] Phone: [phone]   Fax:  [phone]  Name: Amy Barry MRN: [id number] Date of Birth: Jan 02, 1971

## 2017-03-27 ENCOUNTER — Ambulatory Visit: Payer: 59 | Admitting: Physical Therapy

## 2017-03-29 ENCOUNTER — Ambulatory Visit: Payer: 59 | Admitting: Physical Therapy

## 2017-03-30 ENCOUNTER — Ambulatory Visit: Payer: 59

## 2017-03-30 DIAGNOSIS — R2681 Unsteadiness on feet: Secondary | ICD-10-CM

## 2017-03-30 DIAGNOSIS — R2689 Other abnormalities of gait and mobility: Secondary | ICD-10-CM

## 2017-03-30 DIAGNOSIS — R29818 Other symptoms and signs involving the nervous system: Secondary | ICD-10-CM | POA: Diagnosis not present

## 2017-03-30 NOTE — Therapy (Signed)
[facility] [address] [place], [place], [address] Phone: [phone]   Fax:  [phone]  Physical Therapy Treatment  Patient Details  Name: Amy Barry MRN: [id number] Date of Birth: 11-05-1970 Referring Provider: Donald Prose MD    Encounter Date: 03/30/2017  PT End of Session - 03/30/17 1444    Visit Number  41    Number of Visits  45    Date for PT Re-Evaluation  04/14/17    Authorization Type  [employer] social research officer - Visit Number  67    Authorization - Number of Visits  60    PT Start Time  0321    PT Stop Time  1430    PT Time Calculation (min)  43 min    Equipment Utilized During Treatment  Gait belt    Activity Tolerance  Patient tolerated treatment well    Behavior During Therapy  WFL for tasks assessed/performed       Past Medical History:  Diagnosis Date  . Anemia   . Dysrhythmia    pvc's holter monitor started lopressor- no return visit  . GERD (gastroesophageal reflux disease)   . Headache(784.0)   . Heart murmur    child no echo  . History of kidney stones   . Hypertension   . IBS (irritable bowel syndrome)   . Low vitamin D level   . Lower extremity weakness   . Neuromuscular disorder ([facility])   . Numbness and tingling   . Osteoarthritis   . Pneumonia    January- tx with IV antibiotics  for pneumonia in 04/2015   . PVC (premature ventricular contraction)   . Seizure disorder ([facility])    denied seizures- has dyskinesia  . Sleep concern 2013   toldf that she doesn 't have apnea but she stays sleeping a lot  . Upper extremity weakness     Past Surgical History:  Procedure Laterality Date  . 2 ovarian cysts     age 46  . CERVICAL DISC ARTHROPLASTY N/A 10/23/2015   Procedure: Cervical Disc Arthroplasty Cervical six-seven;  Surgeon: Eustace Moore, MD;  Location: [facility];  Service: Neurosurgery;  Laterality: N/A;  . ESOPHAGOGASTRODUODENOSCOPY     for GERD  . LAPAROSCOPIC  ABDOMINAL EXPLORATION     dx ibs  . TUBAL LIGATION      There were no vitals filed for this visit.  Subjective Assessment - 03/30/17 1350    Subjective  Pt denied falls since last visit. Pt reported she received rollator in the mail and brought it with her today.   (Pended)     Limitations  Sitting;Lifting;Standing;Walking;House hold activities  (Pended)     How long can you sit comfortably?  3 hrs prior to onset of pain   (Pended)     Patient Stated Goals  sitting upright, walking, driving, cooking, cleaning, return to work, walk the dog   (Pended)     Currently in Pain?  Yes  (Pended)     Pain Score  8   (Pended)     Pain Location  --  (Pended)  Generalized    Pain Descriptors / Indicators  Aching;Burning  (Pended)     Pain Type  Chronic pain  (Pended)     Pain Onset  More than a month ago  (Pended)     Pain Frequency  Constant  (Pended)     Aggravating Factors   being up and moving around  a lot or sitting for a long time  (Pended)                       [facility] Adult PT Treatment/Exercise - 03/30/17 1435      Ambulation/Gait   Ambulation/Gait  Yes    Ambulation/Gait Assistance  4: Min assist;5: Supervision    Ambulation/Gait Assistance Details  Pt amb. over even surfaces and paved surfaces with new rollator and SPC with attachment (6 points). PT adjusted height of rollator and cane for improved body mechanics. Pt required seated rest breaks 2/2 fatigue and incr. B genu recurvatum with prolonged amb. Cues to improve posture, heel strike, and to decr. B genu recurvatum.    Ambulation Distance (Feet)  75 Feet x4 indoors c rollator, see gait comments for further detail.    Assistive device  Straight cane;Rollator    Gait Pattern  Step-through pattern;Right steppage;Left steppage;Right genu recurvatum;Left genu recurvatum;Ataxic;Lateral hip instability;Trunk flexed    Ambulation Surface  Level;Unlevel;Indoor;Outdoor;Paved    Gait Comments  50'x2 with rollator outdoors, 50'x2  with cane outdoors, 100' with cane indoors.       Exercises   Exercises  Knee/Hip      Knee/Hip Exercises: Aerobic   Elliptical  BUE support on static bars, no resistance, pt propelled elliptical for 2.5 minutes prior to requiring rest break 2/2 weakness (B genu recurvatum and difficulty advancing RLE) and fatigue. PT demonstrated proper txf on/off elliptical and how to propel prior to pt attempting to propel. Pt required min A to txf on/off elliptical and seated rest break after using machine 2/2 fatigue.              PT Education - 03/30/17 1441    Education provided  Yes    Education Details  PT educated pt on proper height for new rollator and cane with attachment (6 points). PT adjusted rollator one notch higher than crease of wrist as pt reported incr. B shoulder and thoracic spine pain from difficulty maintaining upright posture. PT asked pt to notify PT next session if this improved pain. Pt reported she has spoken to husband about joining the [facility] and they will likely join. PT educated pt on not performing elliptical at gym yet, due to weakness and impaired balance-she needs someone guarding her.     Person(s) Educated  Patient    Methods  Explanation    Comprehension  Verbalized understanding       PT Short Term Goals - 02/21/17 1841      PT SHORT TERM GOAL #1   Title  Patient verbalizes and demonstrates understanding of updated HEP. (All STGs Target Date 02/17/17)    Baseline  MET 02/21/2017    Time  4    Period  Weeks    Status  Achieved      PT SHORT TERM GOAL #2   Title  Patient maintains upright for 1 minute without UE support with supervision.     Baseline  intermittent min A for balance x 1 min due to LOB    Time  4    Period  Weeks    Status  Partially Met      PT SHORT TERM GOAL #3   Title  Patient ambulates 200' with cane with minA.     Baseline  MET 11/12 on level surface, min HHA    Time  4    Period  Weeks    Status  Achieved  PT SHORT TERM  GOAL #4   Title  Patient negotiates ramp & curb with cane with modA.     Baseline  MET 11/12     Period  Weeks    Status  Achieved        PT Long Term Goals - 01/18/17 2221      PT LONG TERM GOAL #1   Title  Patient will verbalize understanding and return demonstration for ongoing HEP.  (All LTGs Target Date: 04/14/17)    Baseline  01/18/17 progressing PT continues to update as condition improves.     Time  12    Period  Weeks    Status  On-going    Target Date  04/14/17      PT LONG TERM GOAL #2   Title  Patient maintains upright without UE support for 2 minutes.     Time  12    Period  Weeks    Status  New    Target Date  04/14/17      PT LONG TERM GOAL #3   Title  Patient reaches 10", scans environment, picks up object from floor with cane or less support safely modified independent.     Time  12    Period  Weeks    Status  New    Target Date  04/14/17      PT LONG TERM GOAL #4   Title  Patient negotiates stairs with 1 rail reciprocal pattern modified independent.    Time  12    Period  Weeks    Status  New    Target Date  04/14/17      PT LONG TERM GOAL #5   Title  Timed Up & Go with cane or less <25 sec.     Time  12    Period  Weeks    Status  New    Target Date  04/14/17      PT LONG TERM GOAL #6   Title  Patient will improvem FOTO score by >/= 10% to indicate improvement in self-perceived functional mobility.    Time  12    Period  Weeks    Status  On-going    Target Date  04/14/17      PT LONG TERM GOAL #7   Title  Patient ambulates 500' with cane or less modified independent.     Time  12    Period  Weeks    Status  Revised    Target Date  04/14/17      PT LONG TERM GOAL #8   Title  Patient negotiates ramps, curbs & stairs with cane or less modified independent.     Time  12    Period  Weeks    Status  Revised    Target Date  04/14/17      PT LONG TERM GOAL  #9   TITLE  Berg Balance >36/56     Time  12    Period  Weeks    Status   On-going    Target Date  04/14/17            Plan - 03/30/17 1444    Clinical Impression Statement  Pt continues to experience incr. in gait deviations with fatigue, after amb. longer distances. Pt demonstrated progress, as she was able to amb. over paved surfaces outdoors without LOB. Pt required seated rest breaks 2/2 fatigue and to reduce B genu recurvatum during amb. Pt would continue to  benefit from skilled PT to improve safety during functional mobility.     Rehab Potential  Good    Clinical Impairments Affecting Rehab Potential  HTN, cervical spondylosis, C6-7 L side disc protrusion causing L foraminal impingement, anemia, functional neurological disorder, anemia      PT Frequency  1x / week    PT Duration  12 weeks    PT Treatment/Interventions  ADLs/Self Care Home Management;Electrical Stimulation;Neuromuscular re-education;Balance training;Therapeutic exercise;Therapeutic activities;Functional mobility training;Stair training;Gait training;DME Instruction;Patient/family education;Energy conservation    PT Next Visit Plan  Continue gait training with cane over obstacles, outside over pavement.  Discuss if [employer] mem[employer]hip would be in their budget-could use [employer], pool, etc. (pt is discussing with husband) Trial elliptical (not safe to perform elliptical without S at this time) ; With Shirlean Mylar or Audra: trial with [employer]    Consulted and Agree with Plan of Care  Patient       Patient will benefit from skilled therapeutic intervention in order to improve the following deficits and impairments:  Decreased activity tolerance, Decreased balance, Decreased mobility, Decreased knowledge of use of DME, Decreased endurance, Decreased strength, Impaired flexibility, Impaired tone  Visit Diagnosis: Other abnormalities of gait and mobility  Unsteadiness on feet     Problem List Patient Active Problem List   Diagnosis Date Noted  . Abnormal laboratory test 09/09/2016  . Gait abnormality  08/03/2016  . Weakness 08/03/2016  . Paresthesia 08/03/2016  . S/P cervical spinal fusion 10/23/2015  . Hypersomnia 12/08/2010  . 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  . EPIGASTRIC PAIN 10/13/2009  . IRRITABLE BOWEL SYNDROME 03/10/2009  . BACK PAIN, LUMBAR 07/01/2008  . COSTOCHONDRITIS 02/01/2008  . VIRAL URI 01/08/2008  . CHEST PAIN 01/08/2008  . 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 ARM PAIN 12/25/2007  . ANEMIA-NOS 10/02/2007  . Essential hypertension 10/02/2007  . GERD 10/02/2007  . POLYP, GALLBLADDER 10/02/2007  . UTERINE POLYP 10/02/2007  . OSTEOARTHRITIS 10/02/2007  . Dyskinesia 10/02/2007  . HEADACHE 10/02/2007  . PALPITATIONS 10/02/2007  . CARDIAC MURMUR, HX OF 10/02/2007  . NEPHROLITHIASIS, HX OF 10/02/2007    Muna Demers L 03/30/2017, 2:48 PM  [facility] [address] [place], [place], [address] Phone: [phone]   Fax:  [phone]  Name: DONTE KARY MRN: [id number] Date of Birth: 08-15-1970  Geoffry Paradise, PT,DPT 03/30/17 2:48 PM Phone: [phone] Fax: [phone]

## 2017-04-03 ENCOUNTER — Ambulatory Visit: Payer: 59 | Admitting: Physical Therapy

## 2017-04-05 ENCOUNTER — Encounter: Payer: Self-pay | Admitting: Physical Therapy

## 2017-04-05 ENCOUNTER — Ambulatory Visit: Payer: 59 | Attending: Family Medicine | Admitting: Physical Therapy

## 2017-04-05 VITALS — BP 131/85 | HR 78

## 2017-04-05 DIAGNOSIS — R260 Ataxic gait: Secondary | ICD-10-CM | POA: Diagnosis present

## 2017-04-05 DIAGNOSIS — M6281 Muscle weakness (generalized): Secondary | ICD-10-CM | POA: Insufficient documentation

## 2017-04-05 DIAGNOSIS — R29818 Other symptoms and signs involving the nervous system: Secondary | ICD-10-CM

## 2017-04-05 DIAGNOSIS — R2681 Unsteadiness on feet: Secondary | ICD-10-CM | POA: Insufficient documentation

## 2017-04-05 DIAGNOSIS — Z9181 History of falling: Secondary | ICD-10-CM

## 2017-04-05 DIAGNOSIS — R279 Unspecified lack of coordination: Secondary | ICD-10-CM | POA: Diagnosis present

## 2017-04-05 DIAGNOSIS — R2689 Other abnormalities of gait and mobility: Secondary | ICD-10-CM | POA: Diagnosis not present

## 2017-04-05 NOTE — Therapy (Signed)
[facility] [address] [place], [place], [address] Phone: [phone]   Fax:  [phone]  Physical Therapy Treatment  Patient Details  Name: Amy Barry MRN: [id number] Date of Birth: 06/28/1970 Referring Provider: Vyvyan Sun MD    Encounter Date: 04/05/2017  PT End of Session - 04/05/17 1631    Visit Number  42    Number of Visits  45    Date for PT Re-Evaluation  04/14/17    Authorization Type  [employer]     Authorization - Visit Number  42    Authorization - Number of Visits  60    PT Start Time  1535    PT Stop Time  1620    PT Time Calculation (min)  45 min    Activity Tolerance  Patient tolerated treatment well    Behavior During Therapy  WFL for tasks assessed/performed       Past Medical History:  Diagnosis Date  . Anemia   . Dysrhythmia    pvc's holter monitor started lopressor- no return visit  . GERD (gastroesophageal reflux disease)   . Headache(784.0)   . Heart murmur    child no echo  . History of kidney stones   . Hypertension   . IBS (irritable bowel syndrome)   . Low vitamin D level   . Lower extremity weakness   . Neuromuscular disorder ([facility])   . Numbness and tingling   . Osteoarthritis   . Pneumonia    January- tx with IV antibiotics  for pneumonia in 04/2015   . PVC (premature ventricular contraction)   . Seizure disorder ([facility])    denied seizures- has dyskinesia  . Sleep concern 2013   toldf that she doesn 't have apnea but she stays sleeping a lot  . Upper extremity weakness     Past Surgical History:  Procedure Laterality Date  . 2 ovarian cysts     age 13  . CERVICAL DISC ARTHROPLASTY N/A 10/23/2015   Procedure: Cervical Disc Arthroplasty Cervical six-seven;  Surgeon: David S Jones, MD;  Location: [facility];  Service: Neurosurgery;  Laterality: N/A;  . ESOPHAGOGASTRODUODENOSCOPY     for GERD  . LAPAROSCOPIC ABDOMINAL EXPLORATION     dx ibs  . TUBAL LIGATION       Vitals:   04/05/17 1541  BP: 131/85  Pulse: 78    Subjective Assessment - 04/05/17 1541    Subjective  Days and nights are mixed up; up all night and sleeping a lot during the day.  Went the eye doctor who reports that her vision has changed dramatically and feels like it has changed more over the past week. Feels that may be contributing to the headache.  Has been walking short distances with cane at home; causes a strain on shoulders.    Limitations  Sitting;Lifting;Standing;Walking;House hold activities    How long can you sit comfortably?  3 hrs prior to onset of pain     Patient Stated Goals  sitting upright, walking, driving, cooking, cleaning, return to work, walk the dog     Currently in Pain?  Yes    Pain Score  8     Pain Location  Head    Pain Descriptors / Indicators  Aching;Headache    Pain Type  Acute pain    Pain Onset  In the past 7 days                        [facility] Adult PT Treatment/Exercise - 04/05/17 1558      Ambulation/Gait   Ambulation/Gait  Yes    Ambulation/Gait Assistance  4: Min assist    Ambulation/Gait Assistance Details  gait training with cane with focus on carryover of reciprocal gait sequence practiced on treadmill decreasing use of steppage gait and increased trunk control and head control    Ambulation Distance (Feet)  115 Feet    Assistive device  Straight cane    Gait Pattern  Step-through pattern;Right steppage;Left steppage;Ataxic;Lateral hip instability;Trunk flexed    Ambulation Surface  Level;Indoor      Knee/Hip Exercises: Aerobic   Tread Mill  0.5 mph with bilat UE support x 5 minutes with continued steppage gait and trunk instability.  Performed single leg swing phase training standing one LE on step off of treadmill and contralateral LE performing blocked practice terminal stance > initial contact at 0.3 mph x 2 minutes each LE with verbal and tactile cues fore more trunk and head control in midline and allowing weight  shift to initiate at pelvis             PT Education - 04/05/17 1630    Education provided  Yes    Education Details  gait training on treadmill and with cane    Person(s) Educated  Patient    Methods  Explanation    Comprehension  Need further instruction       PT Short Term Goals - 02/21/17 1841      PT SHORT TERM GOAL #1   Title  Patient verbalizes and demonstrates understanding of updated HEP. (All STGs Target Date 02/17/17)    Baseline  MET 02/21/2017    Time  4    Period  Weeks    Status  Achieved      PT SHORT TERM GOAL #2   Title  Patient maintains upright for 1 minute without UE support with supervision.     Baseline  intermittent min A for balance x 1 min due to LOB    Time  4    Period  Weeks    Status  Partially Met      PT SHORT TERM GOAL #3   Title  Patient ambulates 200' with cane with minA.     Baseline  MET 11/12 on level surface, min HHA    Time  4    Period  Weeks    Status  Achieved      PT SHORT TERM GOAL #4   Title  Patient negotiates ramp & curb with cane with modA.     Baseline  MET 11/12     Period  Weeks    Status  Achieved        PT Long Term Goals - 01/18/17 2221      PT LONG TERM GOAL #1   Title  Patient will verbalize understanding and return demonstration for ongoing HEP.  (All LTGs Target Date: 04/14/17)    Baseline  01/18/17 progressing PT continues to update as condition improves.     Time  12    Period  Weeks    Status  On-going    Target Date  04/14/17      PT LONG TERM GOAL #2   Title  Patient maintains upright without UE support for 2 minutes.     Time  12    Period  Weeks    Status  New    Target Date  04/14/17  PT LONG TERM GOAL #3   Title  Patient reaches 10", scans environment, picks up object from floor with cane or less support safely modified independent.     Time  12    Period  Weeks    Status  New    Target Date  04/14/17      PT LONG TERM GOAL #4   Title  Patient negotiates stairs with 1  rail reciprocal pattern modified independent.    Time  12    Period  Weeks    Status  New    Target Date  04/14/17      PT LONG TERM GOAL #5   Title  Timed Up & Go with cane or less <25 sec.     Time  12    Period  Weeks    Status  New    Target Date  04/14/17      PT LONG TERM GOAL #6   Title  Patient will improvem FOTO score by >/= 10% to indicate improvement in self-perceived functional mobility.    Time  12    Period  Weeks    Status  On-going    Target Date  04/14/17      PT LONG TERM GOAL #7   Title  Patient ambulates 500' with cane or less modified independent.     Time  12    Period  Weeks    Status  Revised    Target Date  04/14/17      PT LONG TERM GOAL #8   Title  Patient negotiates ramps, curbs & stairs with cane or less modified independent.     Time  12    Period  Weeks    Status  Revised    Target Date  04/14/17      PT LONG TERM GOAL  #9   TITLE  Berg Balance >36/56     Time  12    Period  Weeks    Status  On-going    Target Date  04/14/17            Plan - 04/05/17 1631    Clinical Impression Statement  Continued to discuss exercise equipment options at the [facility].  Performed assessment of pt's ability to set up and use treadmill safely x 6 minutes.  Pt continues to utilize steppage gait; transitioned to gait training one LE at a time for isolated stance phase and then swing phase with use of treadmill.  Transitioned to gait with cane with pt requiring decreased assistance and support from therapist on level surface.  Will continue to progress towards LTG.    Rehab Potential  Good    Clinical Impairments Affecting Rehab Potential  HTN, cervical spondylosis, C6-7 L side disc protrusion causing L foraminal impingement, anemia, functional neurological disorder, anemia      PT Frequency  1x / week    PT Duration  12 weeks    PT Treatment/Interventions  ADLs/Self Care Home Management;Electrical Stimulation;Neuromuscular re-education;Balance  training;Therapeutic exercise;Therapeutic activities;Functional mobility training;Stair training;Gait training;DME Instruction;Patient/family education;Energy conservation    PT Next Visit Plan  LTG assessment; recertify or d/c at this time?  Continue gait training with cane over obstacles, outside over pavement.  Continue to try elliptical or treadmill for [employer].    Consulted and Agree with Plan of Care  Patient       Patient will benefit from skilled therapeutic intervention in order to improve the following deficits and impairments:  Decreased activity  tolerance, Decreased balance, Decreased mobility, Decreased knowledge of use of DME, Decreased endurance, Decreased strength, Impaired flexibility, Impaired tone  Visit Diagnosis: Other abnormalities of gait and mobility  Unsteadiness on feet  Other symptoms and signs involving the nervous system  Muscle weakness (generalized)  Ataxic gait  Unspecified lack of coordination  History of falling     Problem List Patient Active Problem List   Diagnosis Date Noted  . Abnormal laboratory test 09/09/2016  . Gait abnormality 08/03/2016  . Weakness 08/03/2016  . Paresthesia 08/03/2016  . S/P cervical spinal fusion 10/23/2015  . Hypersomnia 12/08/2010  . 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  . EPIGASTRIC PAIN 10/13/2009  . IRRITABLE BOWEL SYNDROME 03/10/2009  . BACK PAIN, LUMBAR 07/01/2008  . COSTOCHONDRITIS 02/01/2008  . VIRAL URI 01/08/2008  . CHEST PAIN 01/08/2008  . 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 ARM PAIN 12/25/2007  . ANEMIA-NOS 10/02/2007  . Essential hypertension 10/02/2007  . GERD 10/02/2007  . POLYP, GALLBLADDER 10/02/2007  . UTERINE POLYP 10/02/2007  . OSTEOARTHRITIS 10/02/2007  . Dyskinesia 10/02/2007  . HEADACHE 10/02/2007  . PALPITATIONS 10/02/2007  . CARDIAC MURMUR, HX OF 10/02/2007  . NEPHROLITHIASIS, HX OF 10/02/2007    Rico Junker, PT, DPT 04/05/17    4:38 PM    [facility] [address] [place], [place], [address] Phone: [phone]   Fax:  [phone]  Name: ALEJAH ARISTIZABAL MRN: [id number] Date of Birth: 26-Jan-1971

## 2017-04-10 ENCOUNTER — Encounter (INDEPENDENT_AMBULATORY_CARE_PROVIDER_SITE_OTHER): Payer: Self-pay

## 2017-04-10 ENCOUNTER — Encounter: Payer: Self-pay | Admitting: Physical Therapy

## 2017-04-10 ENCOUNTER — Ambulatory Visit: Payer: 59 | Admitting: Physical Therapy

## 2017-04-10 DIAGNOSIS — R2689 Other abnormalities of gait and mobility: Secondary | ICD-10-CM | POA: Diagnosis not present

## 2017-04-10 DIAGNOSIS — R279 Unspecified lack of coordination: Secondary | ICD-10-CM

## 2017-04-10 DIAGNOSIS — R29818 Other symptoms and signs involving the nervous system: Secondary | ICD-10-CM

## 2017-04-10 DIAGNOSIS — R2681 Unsteadiness on feet: Secondary | ICD-10-CM

## 2017-04-10 DIAGNOSIS — Z9181 History of falling: Secondary | ICD-10-CM

## 2017-04-10 DIAGNOSIS — R260 Ataxic gait: Secondary | ICD-10-CM

## 2017-04-10 DIAGNOSIS — M6281 Muscle weakness (generalized): Secondary | ICD-10-CM

## 2017-04-10 NOTE — Therapy (Signed)
[facility] [address] [place], [place], [address] Phone: [phone]   Fax:  [phone]  Physical Therapy Treatment  Patient Details  Name: Amy Barry MRN: [id number] Date of Birth: Aug 25, 1970 Referring Provider: Donald Prose, MD   Encounter Date: 04/10/2017  PT End of Session - 04/10/17 2024    Visit Number  43    Number of Visits  45    Date for PT Re-Evaluation  04/14/17    Authorization Type  [employer]     Authorization - Visit Number  68    Authorization - Number of Visits  60    PT Start Time  2542    PT Stop Time  1616    PT Time Calculation (min)  45 min    Activity Tolerance  Patient tolerated treatment well    Behavior During Therapy  [facility] for tasks assessed/performed       Past Medical History:  Diagnosis Date  . Anemia   . Dysrhythmia    pvc's holter monitor started lopressor- no return visit  . GERD (gastroesophageal reflux disease)   . Headache(784.0)   . Heart murmur    child no echo  . History of kidney stones   . Hypertension   . IBS (irritable bowel syndrome)   . Low vitamin D level   . Lower extremity weakness   . Neuromuscular disorder ([place])   . Numbness and tingling   . Osteoarthritis   . Pneumonia    January- tx with IV antibiotics  for pneumonia in 04/2015   . PVC (premature ventricular contraction)   . Seizure disorder ([place])    denied seizures- has dyskinesia  . Sleep concern 2013   toldf that she doesn 't have apnea but she stays sleeping a lot  . Upper extremity weakness     Past Surgical History:  Procedure Laterality Date  . 2 ovarian cysts     age 20  . CERVICAL DISC ARTHROPLASTY N/A 10/23/2015   Procedure: Cervical Disc Arthroplasty Cervical six-seven;  Surgeon: Eustace Moore, MD;  Location: [place] [facility];  Service: Neurosurgery;  Laterality: N/A;  . ESOPHAGOGASTRODUODENOSCOPY     for GERD  . LAPAROSCOPIC ABDOMINAL EXPLORATION     dx ibs  . TUBAL LIGATION       There were no vitals filed for this visit.  Subjective Assessment - 04/10/17 1537    Subjective  Reports having pain and soreness in bottom of feet after performing treadmill last session x 2 days.  Still having difficulty sleeping at night.      Limitations  Sitting;Lifting;Standing;Walking;House hold activities    How long can you sit comfortably?  3 hrs prior to onset of pain     Patient Stated Goals  sitting upright, walking, driving, cooking, cleaning, return to work, walk the dog     Currently in Pain?  Yes    Pain Score  7     Pain Location  Generalized    Pain Descriptors / Indicators  Aching    Pain Type  Chronic pain    Pain Onset  In the past 7 days         [facility] PT Assessment - 04/10/17 1548      Assessment   Medical Diagnosis  Diffuse weakness and functional neurological disorder     Referring Provider  Donald Prose, MD    Onset Date/Surgical Date  09/15/16    Prior Therapy  HHPT  Precautions   Precautions  Fall      Prior Function   Level of Independence  Independent      Observation/Other Assessments   Focus on Therapeutic Outcomes (FOTO)   To be completed at next visit      Ambulation/Gait   Ambulation/Gait  Yes    Ambulation/Gait Assistance  4: Min assist    Ambulation/Gait Assistance Details  Pt continues to perform gait on indoor and paved outdoor surfaces with rollator MOD I.  Continued gait training with cane with quad tip on indoor level surfaces, ramp, curb and stairs.  Pt continues to require min HHA of therapist for trunk control/stability when ambulating with the cane    Ambulation Distance (Feet)  230 Feet    Assistive device  Straight cane    Gait Pattern  Step-through pattern;Right steppage;Left steppage;Ataxic;Lateral hip instability;Trunk flexed    Ambulation Surface  Level;Indoor    Stairs  Yes    Stairs Assistance  5: Supervision    Stair Management Technique  One rail Right;One rail Left;Alternating pattern;Forwards;With cane     Number of Stairs  8    Height of Stairs  6    Ramp  4: Min assist    Ramp Details (indicate cue type and reason)  with cane    Curb  4: Min assist    Curb Details (indicate cue type and reason)  with cane and verbal cues for sequence      Standardized Balance Assessment   Standardized Balance Assessment  Berg Balance Test;Timed Up and Go Test      Berg Balance Test   Sit to Stand  Able to stand  independently using hands    Standing Unsupported  Unable to stand 30 seconds unassisted 20 seconds max time    Sitting with Back Unsupported but Feet Supported on Floor or Stool  Able to sit safely and securely 2 minutes    Stand to Sit  Controls descent by using hands    Transfers  Able to transfer safely, definite need of hands    Standing Unsupported with Eyes Closed  Able to stand 3 seconds    Standing Ubsupported with Feet Together  Needs help to attain position and unable to hold for 15 seconds    From Standing, Reach Forward with Outstretched Arm  Reaches forward but needs supervision    From Standing Position, Pick up Object from Floor  Able to pick up shoe, needs supervision    From Standing Position, Turn to Look Behind Over each Shoulder  Needs supervision when turning    Turn 360 Degrees  Needs assistance while turning    Standing Unsupported, Alternately Place Feet on Step/Stool  Able to complete >2 steps/needs minimal assist    Standing Unsupported, One Foot in Front  Needs help to step but can hold 15 seconds    Standing on One Leg  Tries to lift leg/unable to hold 3 seconds but remains standing independently    Total Score  23    Berg comment:  23/56      Timed Up and Go Test   TUG  Normal TUG    Normal TUG (seconds)  19.44    TUG Comments  19.44 with rollator; 21.32 seconds with cane with quad tip                          PT Education - 04/10/17 2023    Education provided  Yes  Education Details  progress towards goals; plan to recertify for 8  more visits.  Pt to work on performing stairs at home with one rail and alternating sequence    Person(s) Educated  Patient;Spouse    Methods  Explanation    Comprehension  Verbalized understanding;Returned demonstration       PT Short Term Goals - 04/10/17 1616      PT SHORT TERM GOAL #1   Title  Patient verbalizes and demonstrates understanding of updated HEP. (All STGs Target Date 02/17/17)    Baseline  MET 02/21/2017    Time  4    Period  Weeks    Status  Achieved      PT SHORT TERM GOAL #2   Title  Patient maintains upright for 1 minute without UE support with supervision.     Baseline  intermittent min A for balance x 1 min due to LOB    Time  4    Period  Weeks    Status  Partially Met      PT SHORT TERM GOAL #3   Title  Patient ambulates 200' with cane with minA.     Baseline  MET 11/12 on level surface, min HHA    Time  4    Period  Weeks    Status  Achieved      PT SHORT TERM GOAL #4   Title  Patient negotiates ramp & curb with cane with modA.     Baseline  MET 11/12     Period  Weeks    Status  Achieved        PT Long Term Goals - 04/10/17 1616      PT LONG TERM GOAL #1   Title  Patient will verbalize understanding and return demonstration for ongoing HEP.  (All LTGs Target Date: 04/14/17)    Baseline  01/18/17 progressing PT continues to update as condition improves.     Time  12    Period  Weeks    Status  Achieved      PT LONG TERM GOAL #2   Title  Patient maintains upright without UE support for 2 minutes.     Baseline  20 seconds     Time  12    Period  Weeks    Status  Not Met      PT LONG TERM GOAL #3   Title  Patient reaches 10", scans environment, picks up object from floor with cane or less support safely modified independent.     Baseline  reaching 8", scans and picks up objects from floor with min A    Time  12    Period  Weeks    Status  Partially Met      PT LONG TERM GOAL #4   Title  Patient negotiates stairs with 1 rail  reciprocal pattern modified independent.    Baseline  supervision with 1 rail and cane with alternating sequence    Time  12    Period  Weeks    Status  Partially Met      PT LONG TERM GOAL #5   Title  Timed Up & Go with cane or less <25 sec.     Baseline  21 seconds with cane, 19 seconds with Rollator    Time  12    Period  Weeks    Status  Achieved      PT LONG TERM GOAL #6   Title  Patient will improvem  FOTO score by >/= 10% to indicate improvement in self-perceived functional mobility.    Time  12    Period  Weeks    Status  On-going      PT LONG TERM GOAL #7   Title  Patient ambulates 500' with cane or less modified independent.     Baseline  250' with cane with HHA    Time  12    Period  Weeks    Status  Partially Met      PT LONG TERM GOAL #8   Title  Patient negotiates ramps, curbs & stairs with cane or less modified independent.     Baseline  Supervision-min A with cane    Time  12    Period  Weeks    Status  Partially Met      PT LONG TERM GOAL  #9   TITLE  Berg Balance >36/56     Baseline  23/56    Time  12    Period  Weeks    Status  Partially Met            Plan - 04/10/17 2025    Clinical Impression Statement  Patient continues to make slow but steady progress and has met 2/9 LTG.  Pt has partially met all other goals and demonstrates improvements in standing balance and improved stair negotiation and gait with cane.  Pt will benefit from ongoing skilled PT services to continue to address impairments in trunk control, grading of movement, coordination, timing/sequencing of movement, balance, gait and strength to maximize functional mobility independence and decrease falls risk.    Rehab Potential  Good    Clinical Impairments Affecting Rehab Potential  HTN, cervical spondylosis, C6-7 L side disc protrusion causing L foraminal impingement, anemia, functional neurological disorder, anemia      PT Frequency  Biweekly    PT Duration  Other (comment) 16  weeks    PT Treatment/Interventions  ADLs/Self Care Home Management;Electrical Stimulation;Neuromuscular re-education;Balance training;Therapeutic exercise;Therapeutic activities;Functional mobility training;Stair training;Gait training;DME Instruction;Patient/family education;Energy conservation;Passive range of motion    PT Next Visit Plan  Treadmill; one LE on/one off; standing balance and trunk control without UE support; Continue gait training with cane over obstacles, outside over pavement.  Continue to try elliptical or treadmill for [employer].    Consulted and Agree with Plan of Care  Patient       Patient will benefit from skilled therapeutic intervention in order to improve the following deficits and impairments:  Decreased activity tolerance, Decreased balance, Decreased mobility, Decreased knowledge of use of DME, Decreased endurance, Decreased strength, Impaired flexibility, Impaired tone, Abnormal gait, Decreased coordination, Difficulty walking, Pain, Postural dysfunction  Visit Diagnosis: Other abnormalities of gait and mobility  Unsteadiness on feet  Other symptoms and signs involving the nervous system  Muscle weakness (generalized)  Ataxic gait  Unspecified lack of coordination  History of falling     Problem List Patient Active Problem List   Diagnosis Date Noted  . Abnormal laboratory test 09/09/2016  . Gait abnormality 08/03/2016  . Weakness 08/03/2016  . Paresthesia 08/03/2016  . S/P cervical spinal fusion 10/23/2015  . Hypersomnia 12/08/2010  . 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  . EPIGASTRIC PAIN 10/13/2009  . IRRITABLE BOWEL SYNDROME 03/10/2009  . BACK PAIN, LUMBAR 07/01/2008  . COSTOCHONDRITIS 02/01/2008  . VIRAL URI 01/08/2008  . CHEST PAIN 01/08/2008  . 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 ARM PAIN 12/25/2007  . ANEMIA-NOS 10/02/2007  . Essential hypertension 10/02/2007  .  GERD 10/02/2007  . POLYP, GALLBLADDER 10/02/2007  . UTERINE  POLYP 10/02/2007  . OSTEOARTHRITIS 10/02/2007  . Dyskinesia 10/02/2007  . HEADACHE 10/02/2007  . PALPITATIONS 10/02/2007  . CARDIAC MURMUR, HX OF 10/02/2007  . NEPHROLITHIASIS, HX OF 10/02/2007    Rico Junker, PT, DPT 04/10/17    8:40 PM    [facility] [address] [place], [place], [address] Phone: [phone]   Fax:  [phone]  Name: Amy Barry MRN: [id number] Date of Birth: 1970/04/14

## 2017-04-19 DIAGNOSIS — R531 Weakness: Secondary | ICD-10-CM | POA: Diagnosis not present

## 2017-04-19 DIAGNOSIS — R29898 Other symptoms and signs involving the musculoskeletal system: Secondary | ICD-10-CM | POA: Diagnosis not present

## 2017-05-05 DIAGNOSIS — M791 Myalgia, unspecified site: Secondary | ICD-10-CM | POA: Diagnosis not present

## 2017-05-22 ENCOUNTER — Ambulatory Visit: Payer: 59 | Attending: Family Medicine | Admitting: Physical Therapy

## 2017-05-22 ENCOUNTER — Encounter: Payer: Self-pay | Admitting: Physical Therapy

## 2017-05-22 ENCOUNTER — Encounter (INDEPENDENT_AMBULATORY_CARE_PROVIDER_SITE_OTHER): Payer: Self-pay

## 2017-05-22 DIAGNOSIS — R29818 Other symptoms and signs involving the nervous system: Secondary | ICD-10-CM | POA: Diagnosis not present

## 2017-05-22 DIAGNOSIS — M6281 Muscle weakness (generalized): Secondary | ICD-10-CM | POA: Diagnosis present

## 2017-05-22 DIAGNOSIS — R279 Unspecified lack of coordination: Secondary | ICD-10-CM

## 2017-05-22 DIAGNOSIS — R2681 Unsteadiness on feet: Secondary | ICD-10-CM | POA: Insufficient documentation

## 2017-05-22 DIAGNOSIS — R260 Ataxic gait: Secondary | ICD-10-CM | POA: Diagnosis present

## 2017-05-22 DIAGNOSIS — R2689 Other abnormalities of gait and mobility: Secondary | ICD-10-CM | POA: Insufficient documentation

## 2017-05-22 NOTE — Therapy (Signed)
[facility] [address] [place], [place], [address] Phone: [phone]   Fax:  [phone]  Physical Therapy Treatment  Patient Details  Name: Amy Barry MRN: [id number] Date of Birth: [birth year] Referring Provider: Donald Prose, MD   Encounter Date: 05/22/2017  PT End of Session - 05/22/17 1647    Visit Number  44    Number of Visits  10 per recertification    Date for PT Re-Evaluation  09/73/53 per recertification    [place] - Visit Number  16    Authorization - Number of Visits  60    PT Start Time  2992    PT Stop Time  1534    PT Time Calculation (min)  45 min    Activity Tolerance  Patient limited by pain    Behavior During Therapy  [facility] for tasks assessed/performed       Past Medical History:  Diagnosis Date  . Anemia   . Dysrhythmia    pvc's holter monitor started lopressor- no return visit  . GERD (gastroesophageal reflux disease)   . Headache(784.0)   . Heart murmur    child no echo  . History of kidney stones   . Hypertension   . IBS (irritable bowel syndrome)   . Low vitamin D level   . Lower extremity weakness   . Neuromuscular disorder ([place])   . Numbness and tingling   . Osteoarthritis   . Pneumonia    January- tx with IV antibiotics  for pneumonia in 04/2015   . PVC (premature ventricular contraction)   . Seizure disorder ([place])    denied seizures- has dyskinesia  . Sleep concern 2013   toldf that she doesn 't have apnea but she stays sleeping a lot  . Upper extremity weakness     Past Surgical History:  Procedure Laterality Date  . 2 ovarian cysts     age 43  . CERVICAL DISC ARTHROPLASTY N/A 10/23/2015   Procedure: Cervical Disc Arthroplasty Cervical six-seven;  Surgeon: Eustace Moore, MD;  Location: [place] [facility];  Service: Neurosurgery;  Laterality: N/A;  . ESOPHAGOGASTRODUODENOSCOPY     for GERD  . LAPAROSCOPIC ABDOMINAL  EXPLORATION     dx ibs  . TUBAL LIGATION      There were no vitals filed for this visit.  Subjective Assessment - 05/22/17 1457    Subjective  Has been doing a lot of stretches and seated core exercises on physioball - pt has noted an improvement in core and ability to stand without falling posterior.  Has been walking intermittently with cane.  There was a delay in patient beginning Lyrica due to insurance denial and then ran out so patient has not been able to take it consistently.  Pt is in a lot of pain today and has experienced greater fatigue.  Has a new desk that will allow her to work at home from 8-5, take two 30 minute breaks and stand when needed.  Has new referral to rheumatologist due to joint edema, stiffness, and pain.    Limitations  Sitting;Lifting;Standing;Walking;House hold activities    How long can you sit comfortably?  3 hrs prior to onset of pain     Patient Stated Goals  sitting upright, walking, driving, cooking, cleaning, return to work, walk the dog     Currently in Pain?  Yes    Pain Score  10-Worst pain ever  Pain Location  Generalized    Pain Descriptors / Indicators  Aching    Pain Type  Chronic pain    Pain Onset  1 to 4 weeks ago                      [facility] Adult PT Treatment/Exercise - 05/22/17 1641      Ambulation/Gait   Ambulation/Gait  Yes    Ambulation/Gait Assistance  6: Modified independent (Device/Increase time)    Ambulation/Gait Assistance Details  slower gait speed today but improved upright trunk and improved trunk control today, decreased lateral hip instability    Ambulation Distance (Feet)  200 Feet    Assistive device  4-wheeled Mould    Gait Pattern  Step-through pattern;Right steppage;Left steppage;Ataxic    Ambulation Surface  Level;Indoor      Neuro Re-ed    Neuro Re-ed Details   NMR seated on physioball for increased trunk and postural control training: performed static control with feet supported with EO and EC,  10 reps each head nods and head turns with min A for balance; performed alternating LE marching with bilat UE support progressing to contralateral UE and LE raise for one UE support x 10 reps each; added in red theraband resistance training performing across the body rotations to L and then to R x 10 each, forward facing rows and UE extensions x 10 each, all with min-mod A for sitting balance and to decrease use of shoulder elevation for stability.  Tolerated well but required intermittent sitting rest breaks.               PT Short Term Goals - 04/10/17 2042      PT SHORT TERM GOAL #1   Title  Patient verbalizes and demonstrates understanding of updated HEP and exercise machines at [facility]    Time  8    Period  Weeks    Status  Revised    Target Date  06/13/17      PT SHORT TERM GOAL #2   Title  Patient maintains upright for 1 minute without UE support with supervision.     Time  8    Period  Weeks    Status  Revised    Target Date  06/13/17      PT SHORT TERM GOAL #3   Title  Patient ambulates 300' and negotiate indoor ramp/curb with min guard/supervision    Time  8    Period  Weeks    Status  Revised    Target Date  06/13/17      PT SHORT TERM GOAL #4   Title  Pt negotiates 12 stairs with one rail, alternating sequence with supervision    Time  8    Period  Weeks    Status  New    Target Date  06/13/17        PT Long Term Goals - 04/10/17 2046      PT LONG TERM GOAL #1   Title  Patient will verbalize understanding and return demonstration for final HEP and community wellness program    Time  16    Period  Weeks    Status  Revised    Target Date  08/12/17      PT LONG TERM GOAL #2   Title  Patient maintains upright without UE support for 2 minutes.     Time  16    Period  Weeks    Status  Revised  Target Date  08/12/17      PT LONG TERM GOAL #3   Title  Patient reaches 10", scans environment, picks up object from floor with cane or less support safely  modified independent.     Time  16    Period  Weeks    Status  Revised    Target Date  08/12/17      PT LONG TERM GOAL #4   Title  Patient negotiates 12 stairs with 1 rail, reciprocal pattern MOD I at home    Time  16    Period  Weeks    Status  Revised    Target Date  08/12/17      PT LONG TERM GOAL #5   Title  Timed Up & Go with cane or less </= 16 sec.     Time  16    Period  Weeks    Status  Revised    Target Date  08/12/17      PT LONG TERM GOAL #6   Title  Patient will improvem FOTO score by >/= 10% to indicate improvement in self-perceived functional mobility.    Baseline  Pt will retake at discharge.    Time  16    Period  Weeks    Status  Revised    Target Date  08/12/17      PT LONG TERM GOAL #7   Title  Pt will ambulate 200' outside over pavement and curb with cane and supervision    Time  16    Period  Weeks    Status  Revised    Target Date  08/12/17      PT LONG TERM GOAL  #9   TITLE  Berg Balance >36/56     Baseline  23/56    Time  16    Period  Weeks    Status  Revised    Target Date  08/12/17            Plan - 05/22/17 1647    Clinical Impression Statement  Pt returns with ongoing reports of significant pain and fatigue.  Despite fatigue and pain pt has been diligent to perform HEP of stretches, LE strengthening and core strengthening on physioball.  Due to pt compliance with HEP pt does demonstrate improved trunk control during gait and improved tolerance of seated trunk control and core exercises on physioball.  Due to improved trunk control progressed to adding UE resisted exercises but required increased assistance to maintain balance.  Will continue to progress and provide pt with progressive exercises to perform at home.    Rehab Potential  Good    Clinical Impairments Affecting Rehab Potential  HTN, cervical spondylosis, C6-7 L side disc protrusion causing L foraminal impingement, anemia, functional neurological disorder, anemia      PT  Frequency  Biweekly    PT Duration  Other (comment) 16 weeks    PT Treatment/Interventions  ADLs/Self Care Home Management;Electrical Stimulation;Neuromuscular re-education;Balance training;Therapeutic exercise;Therapeutic activities;Functional mobility training;Stair training;Gait training;DME Instruction;Patient/family education;Energy conservation;Passive range of motion    PT Next Visit Plan  Trunk control training on physioball (give exercises pt can perform safely at home on ball); quadruped/tall kneeling; Continue gait training with cane over obstacles, outside over pavement.  Continue to try elliptical or treadmill for [employer].    Consulted and Agree with Plan of Care  Patient       Patient will benefit from skilled therapeutic intervention in order to improve the following deficits  and impairments:  Decreased activity tolerance, Decreased balance, Decreased mobility, Decreased knowledge of use of DME, Decreased endurance, Decreased strength, Impaired flexibility, Impaired tone, Abnormal gait, Decreased coordination, Difficulty walking, Pain, Postural dysfunction  Visit Diagnosis: Other abnormalities of gait and mobility  Unsteadiness on feet  Other symptoms and signs involving the nervous system  Muscle weakness (generalized)  Ataxic gait  Unspecified lack of coordination     Problem List Patient Active Problem List   Diagnosis Date Noted  . Abnormal laboratory test 09/09/2016  . Gait abnormality 08/03/2016  . Weakness 08/03/2016  . Paresthesia 08/03/2016  . S/P cervical spinal fusion 10/23/2015  . Hypersomnia 12/08/2010  . 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  . EPIGASTRIC PAIN 10/13/2009  . IRRITABLE BOWEL SYNDROME 03/10/2009  . BACK PAIN, LUMBAR 07/01/2008  . COSTOCHONDRITIS 02/01/2008  . VIRAL URI 01/08/2008  . CHEST PAIN 01/08/2008  . 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 ARM PAIN 12/25/2007  . ANEMIA-NOS 10/02/2007  . Essential hypertension 10/02/2007   . GERD 10/02/2007  . POLYP, GALLBLADDER 10/02/2007  . UTERINE POLYP 10/02/2007  . OSTEOARTHRITIS 10/02/2007  . Dyskinesia 10/02/2007  . HEADACHE 10/02/2007  . PALPITATIONS 10/02/2007  . CARDIAC MURMUR, HX OF 10/02/2007  . NEPHROLITHIASIS, HX OF 10/02/2007    Rico Junker, PT, DPT 05/22/17    4:53 PM    [facility] [address] [place], [place], [address] Phone: [phone]   Fax:  [phone]  Name: Amy Barry MRN: [id number] Date of Birth: 1970-04-22

## 2017-06-09 ENCOUNTER — Encounter: Payer: Self-pay | Admitting: Physical Therapy

## 2017-06-09 ENCOUNTER — Ambulatory Visit: Payer: 59 | Attending: Family Medicine | Admitting: Physical Therapy

## 2017-06-09 DIAGNOSIS — R2689 Other abnormalities of gait and mobility: Secondary | ICD-10-CM | POA: Diagnosis present

## 2017-06-09 DIAGNOSIS — R29818 Other symptoms and signs involving the nervous system: Secondary | ICD-10-CM | POA: Insufficient documentation

## 2017-06-09 DIAGNOSIS — M6281 Muscle weakness (generalized): Secondary | ICD-10-CM | POA: Diagnosis not present

## 2017-06-09 DIAGNOSIS — Z9181 History of falling: Secondary | ICD-10-CM

## 2017-06-09 DIAGNOSIS — R279 Unspecified lack of coordination: Secondary | ICD-10-CM | POA: Diagnosis present

## 2017-06-09 DIAGNOSIS — R2681 Unsteadiness on feet: Secondary | ICD-10-CM | POA: Diagnosis present

## 2017-06-09 DIAGNOSIS — R260 Ataxic gait: Secondary | ICD-10-CM | POA: Diagnosis present

## 2017-06-09 NOTE — Therapy (Signed)
[facility] [address] [place], [place], [address] Phone: [phone]   Fax:  [phone]  Physical Therapy Treatment  Patient Details  Name: Amy Barry MRN: [id number] Date of Birth: Jan 31, 1971 Referring Provider: Donald Prose, MD   Encounter Date: 06/09/2017  PT End of Session - 06/09/17 1558    Visit Number  45    Number of Visits  78 per recertification    Date for PT Re-Evaluation  29/93/71 per recertification    [place] - Visit Number  54    Authorization - Number of Visits  60    PT Start Time  6967    PT Stop Time  1533    PT Time Calculation (min)  42 min    Activity Tolerance  Patient tolerated treatment well    Behavior During Therapy  [facility] for tasks assessed/performed       Past Medical History:  Diagnosis Date  . Anemia   . Dysrhythmia    pvc's holter monitor started lopressor- no return visit  . GERD (gastroesophageal reflux disease)   . Headache(784.0)   . Heart murmur    child no echo  . History of kidney stones   . Hypertension   . IBS (irritable bowel syndrome)   . Low vitamin D level   . Lower extremity weakness   . Neuromuscular disorder ([place])   . Numbness and tingling   . Osteoarthritis   . Pneumonia    January- tx with IV antibiotics  for pneumonia in 04/2015   . PVC (premature ventricular contraction)   . Seizure disorder ([place])    denied seizures- has dyskinesia  . Sleep concern 2013   toldf that she doesn 't have apnea but she stays sleeping a lot  . Upper extremity weakness     Past Surgical History:  Procedure Laterality Date  . 2 ovarian cysts     age 70  . CERVICAL DISC ARTHROPLASTY N/A 10/23/2015   Procedure: Cervical Disc Arthroplasty Cervical six-seven;  Surgeon: Eustace Moore, MD;  Location: [facility];  Service: Neurosurgery;  Laterality: N/A;  . ESOPHAGOGASTRODUODENOSCOPY     for GERD  . LAPAROSCOPIC ABDOMINAL  EXPLORATION     dx ibs  . TUBAL LIGATION      There were no vitals filed for this visit.  Subjective Assessment - 06/09/17 1454    Subjective  Received chair work provided for her to be able to work from home.  Had to make modifications with pillow.  Has started working from home without afternoon naps; was able to sleep all night on Tuesday night.  Has another specialist appointment next week.  In two weeks she will be working part time, 4 hours/day in work office.    Limitations  Sitting;Lifting;Standing;Walking;House hold activities    How long can you sit comfortably?  3 hrs prior to onset of pain     Patient Stated Goals  sitting upright, walking, driving, cooking, cleaning, return to work, walk the dog     Currently in Pain?  Yes    Pain Score  7     Pain Location  Generalized    Pain Type  Chronic pain    Pain Onset  More than a month ago                      [facility] Adult PT Treatment/Exercise - 06/09/17 1537  Transfers   Sit to Stand  6: Modified independent (Device/Increase time)    Sit to Stand Details (indicate cue type and reason)  Pt able to stand x 1:30 without UE support and supervision with improved stability but with pt intermittently touching handles of Rollator.  After 1:30 pt demonstrated R lateral lean and RLE pain      Ambulation/Gait   Ambulation/Gait  Yes    Ambulation/Gait Assistance  6: Modified independent (Device/Increase time)    Ambulation/Gait Assistance Details  initially pt demonstrated smoother gait sequence with decreased steppage and improved proximal stability; as pt fatigued pt demonstrated increased lateral hip instability, steppage gait and knee buckling.      Ambulation Distance (Feet)  300 Feet    Assistive device  4-wheeled Axelson    Gait Pattern  Step-through pattern;Right steppage;Left steppage;Ataxic    Ambulation Surface  Level;Indoor    Stairs  Yes    Stairs Assistance  5: Supervision    Stairs Assistance Details  (indicate cue type and reason)  continued to perform step to sequence with one rail leading with L then leading with R; reporting pain in R knee during ascending and descending    Stair Management Technique  One rail Right;Step to pattern;Forwards No cane    Number of Stairs  12    Height of Stairs  6    Ramp  5: Supervision    Ramp Details (indicate cue type and reason)  with rollator    Curb  5: Supervision    Curb Details (indicate cue type and reason)  with rollator, verbal cues for sequence             PT Education - 06/09/17 1558    Education provided  Yes    Education Details  STG achievement; areas to continue to address    Person(s) Educated  Patient    Methods  Explanation    Comprehension  Verbalized understanding       PT Short Term Goals - 06/09/17 1500      PT SHORT TERM GOAL #1   Title  Patient verbalizes and demonstrates understanding of updated HEP and exercise machines at [facility]; has not had time to start [employer] since returning to work (from home)    Time  8    Period  Weeks    Status  Partially Met      PT SHORT TERM GOAL #2   Title  Patient maintains upright for 1 minute without UE support with supervision.     Baseline  1:30 with supervision but pt would touch RW handle intermittently for balance    Time  8    Period  Weeks    Status  Partially Met      PT SHORT TERM GOAL #3   Title  Patient ambulates 300' and negotiate indoor ramp/curb with min guard/supervision    Baseline  300' with rollator supervision; ramp and curb with close supervision and verbal cues for sequence with rollator    Time  8    Period  Weeks    Status  Achieved      PT SHORT TERM GOAL #4   Title  Pt negotiates 12 stairs with one rail, alternating sequence with supervision    Baseline  12 with step to sequence, one rail supervision-min guard    Time  8    Period  Weeks    Status  Partially Met  PT Long Term Goals - 06/09/17 1603      PT  LONG TERM GOAL #1   Title  Patient will verbalize understanding and return demonstration for final HEP and community wellness program    Time  16    Period  Weeks    Status  Revised    Target Date  08/12/17      PT LONG TERM GOAL #2   Title  Patient maintains upright without UE support for 2 minutes with supervision.     Time  16    Period  Weeks    Status  Revised    Target Date  08/12/17      PT LONG TERM GOAL #3   Title  Patient reaches 10", scans environment, picks up object from floor with cane or less support safely modified independent.     Time  16    Period  Weeks    Status  Revised    Target Date  08/12/17      PT LONG TERM GOAL #4   Title  Patient negotiates 12 stairs with 1 rail, reciprocal pattern supervision at home    Time  16    Period  Weeks    Status  Revised    Target Date  08/12/17      PT LONG TERM GOAL #5   Title  Timed Up & Go with cane or less </= 16 sec.     Time  16    Period  Weeks    Status  Revised    Target Date  08/12/17      PT LONG TERM GOAL #6   Title  Patient will improvem FOTO score by >/= 10% to indicate improvement in self-perceived functional mobility.    Baseline  Pt will retake at discharge.    Time  16    Period  Weeks    Status  Revised    Target Date  08/12/17      PT LONG TERM GOAL #7   Title  Pt will ambulate 300' outside over pavement and curb with rollator and supervision for safe entry/exit at work    Time  16    Period  Weeks    Status  Revised    Target Date  08/12/17      PT LONG TERM GOAL  #9   TITLE  Berg Balance >36/56     Baseline  23/56    Time  16    Period  Weeks    Status  Revised    Target Date  08/12/17            Plan - 06/09/17 1527    Clinical Impression Statement  Treatment session today focused on STG assessment; pt met 1/4 STG, partially meeting other 3.  Pt demonstrating improved endurance, proximal stability and balance in standing and during gait.  As pt fatigues pt demonstrates  increase hip and LE instability and during stair negotiation pt continues to demonstrate decreased stability and strength and reports increased RLE pain when attempting to lead with RLE to ascend, LLE to descend.  Will continue to address endurance, postural control and stability, gait, LE strength and safety in community as pt progresses and works towards returning to work.    Rehab Potential  Good    Clinical Impairments Affecting Rehab Potential  HTN, cervical spondylosis, C6-7 L side disc protrusion causing L foraminal impingement, anemia, functional neurological disorder, anemia      PT Frequency  Biweekly    PT Duration  Other (comment) 16 weeks    PT Treatment/Interventions  ADLs/Self Care Home Management;Electrical Stimulation;Neuromuscular re-education;Balance training;Therapeutic exercise;Therapeutic activities;Functional mobility training;Stair training;Gait training;DME Instruction;Patient/family education;Energy conservation;Passive range of motion    PT Next Visit Plan  Look at gait with cane; stairs alternating sequence work towards one rail.  Proximal stability    Consulted and Agree with Plan of Care  Patient       Patient will benefit from skilled therapeutic intervention in order to improve the following deficits and impairments:  Decreased activity tolerance, Decreased balance, Decreased mobility, Decreased knowledge of use of DME, Decreased endurance, Decreased strength, Impaired flexibility, Impaired tone, Abnormal gait, Decreased coordination, Difficulty walking, Pain, Postural dysfunction  Visit Diagnosis: Other abnormalities of gait and mobility  Unsteadiness on feet  Muscle weakness (generalized)  Ataxic gait  Other symptoms and signs involving the nervous system  Unspecified lack of coordination  History of falling     Problem List Patient Active Problem List   Diagnosis Date Noted  . Abnormal laboratory test 09/09/2016  . Gait abnormality 08/03/2016  .  Weakness 08/03/2016  . Paresthesia 08/03/2016  . S/P cervical spinal fusion 10/23/2015  . Hypersomnia 12/08/2010  . 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  . EPIGASTRIC PAIN 10/13/2009  . IRRITABLE BOWEL SYNDROME 03/10/2009  . BACK PAIN, LUMBAR 07/01/2008  . COSTOCHONDRITIS 02/01/2008  . VIRAL URI 01/08/2008  . CHEST PAIN 01/08/2008  . 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 ARM PAIN 12/25/2007  . ANEMIA-NOS 10/02/2007  . Essential hypertension 10/02/2007  . GERD 10/02/2007  . POLYP, GALLBLADDER 10/02/2007  . UTERINE POLYP 10/02/2007  . OSTEOARTHRITIS 10/02/2007  . Dyskinesia 10/02/2007  . HEADACHE 10/02/2007  . PALPITATIONS 10/02/2007  . CARDIAC MURMUR, HX OF 10/02/2007  . NEPHROLITHIASIS, HX OF 10/02/2007    Rico Junker, PT, DPT 06/09/17    4:09 PM    [facility] [address] [place], [place], [address] Phone: [phone]   Fax:  [phone]  Name: Amy Barry MRN: [id number] Date of Birth: 03/09/1971

## 2017-06-15 DIAGNOSIS — M25561 Pain in right knee: Secondary | ICD-10-CM | POA: Diagnosis not present

## 2017-06-15 DIAGNOSIS — M791 Myalgia, unspecified site: Secondary | ICD-10-CM | POA: Diagnosis not present

## 2017-06-15 DIAGNOSIS — M79641 Pain in right hand: Secondary | ICD-10-CM | POA: Diagnosis not present

## 2017-06-15 DIAGNOSIS — M2550 Pain in unspecified joint: Secondary | ICD-10-CM | POA: Diagnosis not present

## 2017-06-15 DIAGNOSIS — S6992XA Unspecified injury of left wrist, hand and finger(s), initial encounter: Secondary | ICD-10-CM | POA: Diagnosis not present

## 2017-06-23 ENCOUNTER — Encounter: Payer: Self-pay | Admitting: Physical Therapy

## 2017-06-23 ENCOUNTER — Ambulatory Visit: Payer: 59 | Admitting: Physical Therapy

## 2017-06-23 DIAGNOSIS — R29818 Other symptoms and signs involving the nervous system: Secondary | ICD-10-CM

## 2017-06-23 DIAGNOSIS — R2681 Unsteadiness on feet: Secondary | ICD-10-CM

## 2017-06-23 DIAGNOSIS — R2689 Other abnormalities of gait and mobility: Secondary | ICD-10-CM

## 2017-06-23 DIAGNOSIS — M6281 Muscle weakness (generalized): Secondary | ICD-10-CM

## 2017-06-23 DIAGNOSIS — Z9181 History of falling: Secondary | ICD-10-CM

## 2017-06-23 DIAGNOSIS — R260 Ataxic gait: Secondary | ICD-10-CM

## 2017-06-23 DIAGNOSIS — R279 Unspecified lack of coordination: Secondary | ICD-10-CM

## 2017-06-23 NOTE — Therapy (Signed)
[facility] [address] [place], [place], [address] Phone: [phone]   Fax:  [phone]  Physical Therapy Treatment  Patient Details  Name: Amy Barry MRN: [id number] Date of Birth: April 05, 1970 Referring Provider: Donald Prose, MD   Encounter Date: 06/23/2017  PT End of Session - 06/23/17 1605    Visit Number  46    Number of Visits  75 per recertification    Date for PT Re-Evaluation  76/16/07 per recertification    [place] - Visit Number  68    Authorization - Number of Visits  60    PT Start Time  1450    PT Stop Time  1540    PT Time Calculation (min)  50 min    Activity Tolerance  Patient tolerated treatment well    Behavior During Therapy  [facility] for tasks assessed/performed       Past Medical History:  Diagnosis Date  . Anemia   . Dysrhythmia    pvc's holter monitor started lopressor- no return visit  . GERD (gastroesophageal reflux disease)   . Headache(784.0)   . Heart murmur    child no echo  . History of kidney stones   . Hypertension   . IBS (irritable bowel syndrome)   . Low vitamin D level   . Lower extremity weakness   . Neuromuscular disorder ([place])   . Numbness and tingling   . Osteoarthritis   . Pneumonia    January- tx with IV antibiotics  for pneumonia in 04/2015   . PVC (premature ventricular contraction)   . Seizure disorder ([place])    denied seizures- has dyskinesia  . Sleep concern 2013   toldf that she doesn 't have apnea but she stays sleeping a lot  . Upper extremity weakness     Past Surgical History:  Procedure Laterality Date  . 2 ovarian cysts     age 50  . CERVICAL DISC ARTHROPLASTY N/A 10/23/2015   Procedure: Cervical Disc Arthroplasty Cervical six-seven;  Surgeon: Eustace Moore, MD;  Location: [facility];  Service: Neurosurgery;  Laterality: N/A;  . ESOPHAGOGASTRODUODENOSCOPY     for GERD  . LAPAROSCOPIC ABDOMINAL  EXPLORATION     dx ibs  . TUBAL LIGATION      There were no vitals filed for this visit.  Subjective Assessment - 06/23/17 1456    Subjective  Has been back to work for 4 hours at a time and then works at night until 6pm.  Is exhausted by the weekend and takes all weekend to recover.  Went to see specialist - did x-rays of knees and wrists and lab work.  Called her and told her SED rate was high; has follow up appointment in a month.    Limitations  Sitting;Lifting;Standing;Walking;House hold activities    How long can you sit comfortably?  3 hrs prior to onset of pain     Patient Stated Goals  sitting upright, walking, driving, cooking, cleaning, return to work, walk the dog     Pain Onset  More than a month ago         [facility] Adult PT Treatment/Exercise - 06/23/17 1553      Therapeutic Activites    Therapeutic Activities  Other Therapeutic Activities    Other Therapeutic Activities  pt reporting husband will not let her walk around the grocery store and she has to use the electric scooter.  Discussed working up to walking around whole store starting with produce section and adding 1 more aisle each week.        Neuro Re-ed    Neuro Re-ed Details   NMR during rockerboard step overs with therapist providing max-total verbal, tactile cues and manual facilitation at trunk and pelvis to maintain neutral pelvis, back and head position when weight shifting forwards and backwards over stance LE to decreease pt reliance on and initiation of movement with head, neck, back extensor pattern and increased pelvic tilt.  Transitioned to standing with bilat UE support on physioball on mat performing ball roll outs <> in through hip flexion<>extension while keeping back, neck and head in neutral and decreasing use of neck extension to initiate movement - required therapist max facilitation for correct sequencing (mountain pose <> halfway lift).        Knee/Hip Exercises: Aerobic   Other Aerobic  Seated LE  endurance with floor bike/peddler forwards and backwards for 30 seconds at a time; unable to perform in unsupported sitting and had to switch to supported sitting in chair but pt continued to push posterior with trunk and required manual facilitation of therapist to coordinate peddling sequence          Balance Exercises - 06/23/17 1517      Balance Exercises: Standing   Rockerboard  Anterior/posterior;EO;10 reps;UE support ant/post step overs R/L LE        PT Education - 06/23/17 1604    Education provided  Yes    Education Details  progressing walking distance at grocery store, extensor movement patterns, use of floor bike for UE and LE endurance training at [employer] on purchasing information for floor bike online    Person(s) Educated  Patient    Methods  Explanation    Comprehension  Verbalized understanding       PT Short Term Goals - 06/09/17 1500      PT [place] #1   Title  Patient verbalizes and demonstrates understanding of updated HEP and exercise machines at [facility]; has not had time to start [employer] since returning to work (from home)    Time  8    Period  Weeks    Status  Partially Met      PT SHORT TERM GOAL #2   Title  Patient maintains upright for 1 minute without UE support with supervision.     Baseline  1:30 with supervision but pt would touch RW handle intermittently for balance    Time  8    Period  Weeks    Status  Partially Met      PT SHORT TERM GOAL #3   Title  Patient ambulates 300' and negotiate indoor ramp/curb with min guard/supervision    Baseline  300' with rollator supervision; ramp and curb with close supervision and verbal cues for sequence with rollator    Time  8    Period  Weeks    Status  Achieved      PT SHORT TERM GOAL #4   Title  Pt negotiates 12 stairs with one rail, alternating sequence with supervision    Baseline  12 with step to sequence, one rail supervision-min guard    Time  8    Period   Weeks    Status  Partially Met        PT Long Term Goals - 06/09/17 1603      PT LONG TERM GOAL #  1   Title  Patient will verbalize understanding and return demonstration for final HEP and community wellness program    Time  16    Period  Weeks    Status  Revised    Target Date  08/12/17      PT LONG TERM GOAL #2   Title  Patient maintains upright without UE support for 2 minutes with supervision.     Time  16    Period  Weeks    Status  Revised    Target Date  08/12/17      PT LONG TERM GOAL #3   Title  Patient reaches 10", scans environment, picks up object from floor with cane or less support safely modified independent.     Time  16    Period  Weeks    Status  Revised    Target Date  08/12/17      PT LONG TERM GOAL #4   Title  Patient negotiates 12 stairs with 1 rail, reciprocal pattern supervision at home    Time  16    Period  Weeks    Status  Revised    Target Date  08/12/17      PT LONG TERM GOAL #5   Title  Timed Up & Go with cane or less </= 16 sec.     Time  16    Period  Weeks    Status  Revised    Target Date  08/12/17      PT LONG TERM GOAL #6   Title  Patient will improvem FOTO score by >/= 10% to indicate improvement in self-perceived functional mobility.    Baseline  Pt will retake at discharge.    Time  16    Period  Weeks    Status  Revised    Target Date  08/12/17      PT LONG TERM GOAL #7   Title  Pt will ambulate 300' outside over pavement and curb with rollator and supervision for safe entry/exit at work    Time  16    Period  Weeks    Status  Revised    Target Date  08/12/17      PT LONG TERM GOAL  #9   TITLE  Berg Balance >36/56     Baseline  23/56    Time  16    Period  Weeks    Status  Revised    Target Date  08/12/17            Plan - 06/23/17 1605    Clinical Impression Statement  Treatment session focused on problem solving way for pt to perform endurance training in her home; trialed use of floor bike for LE  endurance and discussed placing on table for UE endurance; provide pt with purchasing information.  Also discussed ways pt could gradually increase endurance with mobility in the community.  Rest of session focused on NMR and decreasing use of extensor movement patterns to initiate movement and increased core activation/stability for more controlled distal movement.  Will continue to address and progress towards LTG: pt's goal is to be walking with cane by daughter's high school graduation on June 7th.    Rehab Potential  Good    Clinical Impairments Affecting Rehab Potential  HTN, cervical spondylosis, C6-7 L side disc protrusion causing L foraminal impingement, anemia, functional neurological disorder, anemia      PT Frequency  Biweekly    PT Duration  Other (  comment) 16 weeks    PT Treatment/Interventions  ADLs/Self Care Home Management;Electrical Stimulation;Neuromuscular re-education;Balance training;Therapeutic exercise;Therapeutic activities;Functional mobility training;Stair training;Gait training;DME Instruction;Patient/family education;Energy conservation;Passive range of motion    PT Next Visit Plan  Did she buy floor bike? walking around grocery store?  break up extensor patterns and use of neck extension - step downs in various directions on 2" step, gait with cane    Consulted and Agree with Plan of Care  Patient       Patient will benefit from skilled therapeutic intervention in order to improve the following deficits and impairments:  Decreased activity tolerance, Decreased balance, Decreased mobility, Decreased knowledge of use of DME, Decreased endurance, Decreased strength, Impaired flexibility, Impaired tone, Abnormal gait, Decreased coordination, Difficulty walking, Pain, Postural dysfunction  Visit Diagnosis: Other abnormalities of gait and mobility  Unsteadiness on feet  Muscle weakness (generalized)  Ataxic gait  Other symptoms and signs involving the nervous  system  Unspecified lack of coordination  History of falling     Problem List Patient Active Problem List   Diagnosis Date Noted  . Abnormal laboratory test 09/09/2016  . Gait abnormality 08/03/2016  . Weakness 08/03/2016  . Paresthesia 08/03/2016  . S/P cervical spinal fusion 10/23/2015  . Hypersomnia 12/08/2010  . 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  . EPIGASTRIC PAIN 10/13/2009  . IRRITABLE BOWEL SYNDROME 03/10/2009  . BACK PAIN, LUMBAR 07/01/2008  . COSTOCHONDRITIS 02/01/2008  . VIRAL URI 01/08/2008  . CHEST PAIN 01/08/2008  . 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 ARM PAIN 12/25/2007  . ANEMIA-NOS 10/02/2007  . Essential hypertension 10/02/2007  . GERD 10/02/2007  . POLYP, GALLBLADDER 10/02/2007  . UTERINE POLYP 10/02/2007  . OSTEOARTHRITIS 10/02/2007  . Dyskinesia 10/02/2007  . HEADACHE 10/02/2007  . PALPITATIONS 10/02/2007  . CARDIAC MURMUR, HX OF 10/02/2007  . NEPHROLITHIASIS, HX OF 10/02/2007   Rico Junker, PT, DPT 06/23/17    4:11 PM    [facility] [address] [place], [place], [address] Phone: [phone]   Fax:  [phone]  Name: Amy Barry MRN: [id number] Date of Birth: December 15, 1970

## 2017-07-06 DIAGNOSIS — I10 Essential (primary) hypertension: Secondary | ICD-10-CM | POA: Diagnosis not present

## 2017-07-06 DIAGNOSIS — Z13220 Encounter for screening for lipoid disorders: Secondary | ICD-10-CM | POA: Diagnosis not present

## 2017-07-06 DIAGNOSIS — Z0000 Encounter for general adult medical examination without abnormal findings: Secondary | ICD-10-CM | POA: Diagnosis not present

## 2017-07-07 ENCOUNTER — Encounter: Payer: Self-pay | Admitting: Physical Therapy

## 2017-07-07 ENCOUNTER — Ambulatory Visit: Payer: 59 | Attending: Family Medicine | Admitting: Physical Therapy

## 2017-07-07 DIAGNOSIS — E559 Vitamin D deficiency, unspecified: Secondary | ICD-10-CM | POA: Diagnosis not present

## 2017-07-07 DIAGNOSIS — R29818 Other symptoms and signs involving the nervous system: Secondary | ICD-10-CM

## 2017-07-07 DIAGNOSIS — R2689 Other abnormalities of gait and mobility: Secondary | ICD-10-CM | POA: Insufficient documentation

## 2017-07-07 DIAGNOSIS — D509 Iron deficiency anemia, unspecified: Secondary | ICD-10-CM | POA: Diagnosis not present

## 2017-07-07 DIAGNOSIS — R2681 Unsteadiness on feet: Secondary | ICD-10-CM

## 2017-07-07 DIAGNOSIS — Z9181 History of falling: Secondary | ICD-10-CM | POA: Diagnosis present

## 2017-07-07 DIAGNOSIS — R279 Unspecified lack of coordination: Secondary | ICD-10-CM | POA: Diagnosis present

## 2017-07-07 DIAGNOSIS — I10 Essential (primary) hypertension: Secondary | ICD-10-CM | POA: Diagnosis not present

## 2017-07-07 DIAGNOSIS — R260 Ataxic gait: Secondary | ICD-10-CM | POA: Diagnosis present

## 2017-07-07 DIAGNOSIS — M6281 Muscle weakness (generalized): Secondary | ICD-10-CM

## 2017-07-07 NOTE — Therapy (Signed)
[facility] [address] [place], [place], [address] Phone: [phone]   Fax:  [phone]  Physical Therapy Treatment  Patient Details  Name: Amy Barry MRN: [id number] Date of Birth: [birth year] Referring Provider: Donald Prose, MD   Encounter Date: 07/07/2017  PT End of Session - 07/07/17 1714    Visit Number  47    Number of Visits  62 per recertification    Date for PT Re-Evaluation  48/01/65 per recertification    [place] - Visit Number  51    Authorization - Number of Visits  60    PT Start Time  5374    PT Stop Time  1535    PT Time Calculation (min)  47 min    Activity Tolerance  Patient tolerated treatment well    Behavior During Therapy  [facility] for tasks assessed/performed       Past Medical History:  Diagnosis Date  . Anemia   . Dysrhythmia    pvc's holter monitor started lopressor- no return visit  . GERD (gastroesophageal reflux disease)   . Headache(784.0)   . Heart murmur    child no echo  . History of kidney stones   . Hypertension   . IBS (irritable bowel syndrome)   . Low vitamin D level   . Lower extremity weakness   . Neuromuscular disorder ([place])   . Numbness and tingling   . Osteoarthritis   . Pneumonia    January- tx with IV antibiotics  for pneumonia in 04/2015   . PVC (premature ventricular contraction)   . Seizure disorder ([place])    denied seizures- has dyskinesia  . Sleep concern 2013   toldf that she doesn 't have apnea but she stays sleeping a lot  . Upper extremity weakness     Past Surgical History:  Procedure Laterality Date  . 2 ovarian cysts     age 9  . CERVICAL DISC ARTHROPLASTY N/A 10/23/2015   Procedure: Cervical Disc Arthroplasty Cervical six-seven;  Surgeon: Eustace Moore, MD;  Location: [facility];  Service: Neurosurgery;  Laterality: N/A;  . ESOPHAGOGASTRODUODENOSCOPY     for GERD  . LAPAROSCOPIC ABDOMINAL  EXPLORATION     dx ibs  . TUBAL LIGATION      There were no vitals filed for this visit.  Subjective Assessment - 07/07/17 1452    Subjective  This past weekend pt was so fatigued that she was unable to ambulate or go up the stairs; needed total A from family and had to stay in the bed all day Sunday.  PCP put her on an anti-inflammatory and Prozac.  Did purchase floor pedal bike and can perform 100 reps in a couple minutes.    Limitations  Sitting;Lifting;Standing;Walking;House hold activities    How long can you sit comfortably?  3 hrs prior to onset of pain     Patient Stated Goals  sitting upright, walking, driving, cooking, cleaning, return to work, walk the dog     Currently in Pain?  Yes    Pain Score  7     Pain Location  Generalized    Pain Type  Chronic pain    Pain Onset  More than a month ago                       [facility] Adult PT Treatment/Exercise - 07/07/17 1707  Neuro Re-ed    Neuro Re-ed Details   NMR: returned to step overs but with one foot on stable 4" box RLE x 10 reps, LLE x 10 reps with pt holding tennis ball between chin and chest to minimize use of neck and back extension to advance COG forwards; therapist providing manual facilitation and cues for core and proximal hip activation and forward weight shift of COG from pelvis instead of lumbar extension and shoulder extension (cued to slide hands forwards and back).  2 sets x 10 reps each LE with mod A from therapist.  Transitioned to rockerboard laterally performing weight shifting with trunk elongation and shortening but maintaining proximal hip position and stability.                 PT Short Term Goals - 06/09/17 1500      PT SHORT TERM GOAL #1   Title  Patient verbalizes and demonstrates understanding of updated HEP and exercise machines at [facility]; has not had time to start [employer] since returning to work (from home)    Time  8    Period  Weeks    Status   Partially Met      PT SHORT TERM GOAL #2   Title  Patient maintains upright for 1 minute without UE support with supervision.     Baseline  1:30 with supervision but pt would touch RW handle intermittently for balance    Time  8    Period  Weeks    Status  Partially Met      PT SHORT TERM GOAL #3   Title  Patient ambulates 300' and negotiate indoor ramp/curb with min guard/supervision    Baseline  300' with rollator supervision; ramp and curb with close supervision and verbal cues for sequence with rollator    Time  8    Period  Weeks    Status  Achieved      PT SHORT TERM GOAL #4   Title  Pt negotiates 12 stairs with one rail, alternating sequence with supervision    Baseline  12 with step to sequence, one rail supervision-min guard    Time  8    Period  Weeks    Status  Partially Met        PT Long Term Goals - 06/09/17 1603      PT LONG TERM GOAL #1   Title  Patient will verbalize understanding and return demonstration for final HEP and community wellness program    Time  16    Period  Weeks    Status  Revised    Target Date  08/12/17      PT LONG TERM GOAL #2   Title  Patient maintains upright without UE support for 2 minutes with supervision.     Time  16    Period  Weeks    Status  Revised    Target Date  08/12/17      PT LONG TERM GOAL #3   Title  Patient reaches 10", scans environment, picks up object from floor with cane or less support safely modified independent.     Time  16    Period  Weeks    Status  Revised    Target Date  08/12/17      PT LONG TERM GOAL #4   Title  Patient negotiates 12 stairs with 1 rail, reciprocal pattern supervision at home    Time  16    Period  Weeks    Status  Revised    Target Date  08/12/17      PT LONG TERM GOAL #5   Title  Timed Up & Go with cane or less </= 16 sec.     Time  16    Period  Weeks    Status  Revised    Target Date  08/12/17      PT LONG TERM GOAL #6   Title  Patient will improvem FOTO score by  >/= 10% to indicate improvement in self-perceived functional mobility.    Baseline  Pt will retake at discharge.    Time  16    Period  Weeks    Status  Revised    Target Date  08/12/17      PT LONG TERM GOAL #7   Title  Pt will ambulate 300' outside over pavement and curb with rollator and supervision for safe entry/exit at work    Time  16    Period  Weeks    Status  Revised    Target Date  08/12/17      PT LONG TERM GOAL  #9   TITLE  Berg Balance >36/56     Baseline  23/56    Time  16    Period  Weeks    Status  Revised    Target Date  08/12/17            Plan - 07/07/17 1714    Clinical Impression Statement  Continued NMR in // bars to focus on trunk and postural control to provide increasd proximal stability and decreased use of neck and lumbar extension and lateral hip instability during gait and dynamic standing balance activities.  Pt tolerated well but required total cues for awareness of trunk and pelvis positioning and sequencing of weight shifting.  Little carryover to gait noted.  Will continue to address and progress towards LTG.    Rehab Potential  Good    Clinical Impairments Affecting Rehab Potential  HTN, cervical spondylosis, C6-7 L side disc protrusion causing L foraminal impingement, anemia, functional neurological disorder, anemia      PT Frequency  Biweekly    PT Duration  Other (comment) 16 weeks    PT Treatment/Interventions  ADLs/Self Care Home Management;Electrical Stimulation;Neuromuscular re-education;Balance training;Therapeutic exercise;Therapeutic activities;Functional mobility training;Stair training;Gait training;DME Instruction;Patient/family education;Energy conservation;Passive range of motion    PT Next Visit Plan  break up extensor patterns and use of neck extension - step downs in various directions on 2" step, gait with cane    Consulted and Agree with Plan of Care  Patient       Patient will benefit from skilled therapeutic  intervention in order to improve the following deficits and impairments:  Decreased activity tolerance, Decreased balance, Decreased mobility, Decreased knowledge of use of DME, Decreased endurance, Decreased strength, Impaired flexibility, Impaired tone, Abnormal gait, Decreased coordination, Difficulty walking, Pain, Postural dysfunction  Visit Diagnosis: Other abnormalities of gait and mobility  Unsteadiness on feet  Muscle weakness (generalized)  Ataxic gait  Other symptoms and signs involving the nervous system  Unspecified lack of coordination  History of falling     Problem List Patient Active Problem List   Diagnosis Date Noted  . Abnormal laboratory test 09/09/2016  . Gait abnormality 08/03/2016  . Weakness 08/03/2016  . Paresthesia 08/03/2016  . S/P cervical spinal fusion 10/23/2015  . Hypersomnia 12/08/2010  . 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  . EPIGASTRIC PAIN  10/13/2009  . IRRITABLE BOWEL SYNDROME 03/10/2009  . BACK PAIN, LUMBAR 07/01/2008  . COSTOCHONDRITIS 02/01/2008  . VIRAL URI 01/08/2008  . CHEST PAIN 01/08/2008  . 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 ARM PAIN 12/25/2007  . ANEMIA-NOS 10/02/2007  . Essential hypertension 10/02/2007  . GERD 10/02/2007  . POLYP, GALLBLADDER 10/02/2007  . UTERINE POLYP 10/02/2007  . OSTEOARTHRITIS 10/02/2007  . Dyskinesia 10/02/2007  . HEADACHE 10/02/2007  . PALPITATIONS 10/02/2007  . CARDIAC MURMUR, HX OF 10/02/2007  . NEPHROLITHIASIS, HX OF 10/02/2007    Rico Junker, PT, DPT 07/07/17    5:24 PM    [facility] [address] [place], [place], [address] Phone: [phone]   Fax:  [phone]  Name: Amy Barry MRN: [id number] Date of Birth: Dec 09, 1970

## 2017-07-20 ENCOUNTER — Ambulatory Visit: Payer: 59 | Admitting: Physical Therapy

## 2017-07-20 ENCOUNTER — Encounter: Payer: Self-pay | Admitting: Physical Therapy

## 2017-07-20 DIAGNOSIS — R2689 Other abnormalities of gait and mobility: Secondary | ICD-10-CM | POA: Diagnosis not present

## 2017-07-20 DIAGNOSIS — R260 Ataxic gait: Secondary | ICD-10-CM

## 2017-07-20 DIAGNOSIS — M6281 Muscle weakness (generalized): Secondary | ICD-10-CM

## 2017-07-20 DIAGNOSIS — R29818 Other symptoms and signs involving the nervous system: Secondary | ICD-10-CM

## 2017-07-20 DIAGNOSIS — R279 Unspecified lack of coordination: Secondary | ICD-10-CM

## 2017-07-20 DIAGNOSIS — R2681 Unsteadiness on feet: Secondary | ICD-10-CM

## 2017-07-20 NOTE — Therapy (Signed)
[facility] [address] [place], [place], [address] Phone: [phone]   Fax:  [phone]  Physical Therapy Treatment  Patient Details  Name: Amy Barry MRN: [id number] Date of Birth: October 21, 1970 Referring Provider: Donald Prose, MD   Encounter Date: 07/20/2017  PT End of Session - 07/20/17 2014    Visit Number  48    Number of Visits  51 per recertification    Date for PT Re-Evaluation  27/78/24 per recertification    [facility] - Visit Number  79    Authorization - Number of Visits  60    PT Start Time  2353    PT Stop Time  1540    PT Time Calculation (min)  41 min    Activity Tolerance  Patient tolerated treatment well    Behavior During Therapy  [facility] for tasks assessed/performed       Past Medical History:  Diagnosis Date  . Anemia   . Dysrhythmia    pvc's holter monitor started lopressor- no return visit  . GERD (gastroesophageal reflux disease)   . Headache(784.0)   . Heart murmur    child no echo  . History of kidney stones   . Hypertension   . IBS (irritable bowel syndrome)   . Low vitamin D level   . Lower extremity weakness   . Neuromuscular disorder ([place])   . Numbness and tingling   . Osteoarthritis   . Pneumonia    January- tx with IV antibiotics  for pneumonia in 04/2015   . PVC (premature ventricular contraction)   . Seizure disorder ([place])    denied seizures- has dyskinesia  . Sleep concern 2013   toldf that she doesn 't have apnea but she stays sleeping a lot  . Upper extremity weakness     Past Surgical History:  Procedure Laterality Date  . 2 ovarian cysts     age 12  . CERVICAL DISC ARTHROPLASTY N/A 10/23/2015   Procedure: Cervical Disc Arthroplasty Cervical six-seven;  Surgeon: Eustace Moore, MD;  Location: [facility];  Service: Neurosurgery;  Laterality: N/A;  . ESOPHAGOGASTRODUODENOSCOPY     for GERD  . LAPAROSCOPIC ABDOMINAL  EXPLORATION     dx ibs  . TUBAL LIGATION      There were no vitals filed for this visit.  Subjective Assessment - 07/20/17 1501    Subjective  Stopped the anti-inflammatory and the Prozac due to poor tolerance.  Still feeling very fatigued but no further episodes of not being able to walk.  Still using floor pedal bike every day and stretching 2x/day.    Limitations  Sitting;Lifting;Standing;Walking;House hold activities    How long can you sit comfortably?  3 hrs prior to onset of pain     Patient Stated Goals  sitting upright, walking, driving, cooking, cleaning, return to work, walk the dog     Currently in Pain?  Yes    Pain Score  7     Pain Location  Generalized    Pain Descriptors / Indicators  Constant;Discomfort    Pain Type  Chronic pain    Pain Onset  More than a month ago                       [facility] Adult PT Treatment/Exercise - 07/20/17 2003      Ambulation/Gait   Ambulation/Gait  Yes    Ambulation/Gait Assistance  3: Mod assist    Ambulation/Gait Assistance Details  NMR during gait with cane around gym with pt keeping eyes closed to increase attention to and awareness of body position and initiation of weight shifting laterally and forwards.  Therapist provided facilitation and cues at R pelvis and L trunk for increased postural control.  Overall pt demonstrated significantly improved trunk control and proximal hip stability with decreased trunk and lateral flexion, improved step length with heel strike, decreased stomping and recurvatum at initial stance and improved coordination/grading of movement.    Ambulation Distance (Feet)  115 Feet    Assistive device  Straight cane    Gait Pattern  Step-through pattern    Ambulation Surface  Level;Indoor      Neuro Re-ed    Neuro Re-ed Details   NMR: returned to step overs but with one foot on stable 2" box RLE x 10 reps, LLE x 10 reps - pt did not require tennis ball between chin and chest, demonstrated  decreased use of neck and back extension to advance COG forwards; therapist continued to provide manual facilitation at pelvis and trunk to decrease use of lumbar extension and genu recurvatum to initiate stance phase extension and for pt to initiate anterior weight shift of COG from pelvis.  Removed box and performed gait forwards and retro in // bars with hands gliding along bars > hands floating above bars with continued focus on postural control and initiation of weight shift from pelvis - pt had to close eyes to allow increased concentration and awareness of body position and activation.  Transitioned to gait for carryover.             PT Education - 07/20/17 2013    Education provided  Yes    Education Details  gait training with cane; pt goal is to walk without [place] by summer    Person(s) Educated  Patient    Methods  Tactile cues;Verbal cues    Comprehension  Need further instruction       PT Short Term Goals - 06/09/17 1500      PT SHORT TERM GOAL #1   Title  Patient verbalizes and demonstrates understanding of updated HEP and exercise machines at [facility]; has not had time to start [employer] since returning to work (from home)    Time  8    Period  Weeks    Status  Partially Met      PT SHORT TERM GOAL #2   Title  Patient maintains upright for 1 minute without UE support with supervision.     Baseline  1:30 with supervision but pt would touch RW handle intermittently for balance    Time  8    Period  Weeks    Status  Partially Met      PT SHORT TERM GOAL #3   Title  Patient ambulates 300' and negotiate indoor ramp/curb with min guard/supervision    Baseline  300' with rollator supervision; ramp and curb with close supervision and verbal cues for sequence with rollator    Time  8    Period  Weeks    Status  Achieved      PT SHORT TERM GOAL #4   Title  Pt negotiates 12 stairs with one rail, alternating sequence with supervision    Baseline  12 with  step to sequence, one rail supervision-min guard    Time  8    Period  Weeks  Status  Partially Met        PT Long Term Goals - 06/09/17 1603      PT LONG TERM GOAL #1   Title  Patient will verbalize understanding and return demonstration for final HEP and community wellness program    Time  16    Period  Weeks    Status  Revised    Target Date  08/12/17      PT LONG TERM GOAL #2   Title  Patient maintains upright without UE support for 2 minutes with supervision.     Time  16    Period  Weeks    Status  Revised    Target Date  08/12/17      PT LONG TERM GOAL #3   Title  Patient reaches 10", scans environment, picks up object from floor with cane or less support safely modified independent.     Time  16    Period  Weeks    Status  Revised    Target Date  08/12/17      PT LONG TERM GOAL #4   Title  Patient negotiates 12 stairs with 1 rail, reciprocal pattern supervision at home    Time  16    Period  Weeks    Status  Revised    Target Date  08/12/17      PT LONG TERM GOAL #5   Title  Timed Up & Go with cane or less </= 16 sec.     Time  16    Period  Weeks    Status  Revised    Target Date  08/12/17      PT LONG TERM GOAL #6   Title  Patient will improvem FOTO score by >/= 10% to indicate improvement in self-perceived functional mobility.    Baseline  Pt will retake at discharge.    Time  16    Period  Weeks    Status  Revised    Target Date  08/12/17      PT LONG TERM GOAL #7   Title  Pt will ambulate 300' outside over pavement and curb with rollator and supervision for safe entry/exit at work    Time  16    Period  Weeks    Status  Revised    Target Date  08/12/17      PT LONG TERM GOAL  #9   TITLE  Berg Balance >36/56     Baseline  23/56    Time  16    Period  Weeks    Status  Revised    Target Date  08/12/17            Plan - 07/20/17 2015    Clinical Impression Statement  Returned to NMR in // bars to continue to address trunk/postural  control and grading of movement with decreased UE support with pt requiring decreased use of therapist tactile and verbal cues and did not require use of tennis ball to decrease extensor pattern of initiation.  Pt able to progress to ambulation in bars without UE support and gait around gym with cane with significant improvement in gait sequence and postural control.  Will continue to progress towards pt's goal of ambulating with cane by summer.      Rehab Potential  Good    Clinical Impairments Affecting Rehab Potential  HTN, cervical spondylosis, C6-7 L side disc protrusion causing L foraminal impingement, anemia, functional neurological disorder, anemia  PT Frequency  Biweekly    PT Duration  Other (comment) 16 weeks    PT Treatment/Interventions  ADLs/Self Care Home Management;Electrical Stimulation;Neuromuscular re-education;Balance training;Therapeutic exercise;Therapeutic activities;Functional mobility training;Stair training;Gait training;DME Instruction;Patient/family education;Energy conservation;Passive range of motion    PT Next Visit Plan  break up extensor patterns and use of neck extension; standing without use of UE support/weight shifting, weighted belt? gait with cane    Consulted and Agree with Plan of Care  Patient       Patient will benefit from skilled therapeutic intervention in order to improve the following deficits and impairments:  Decreased activity tolerance, Decreased balance, Decreased mobility, Decreased knowledge of use of DME, Decreased endurance, Decreased strength, Impaired flexibility, Impaired tone, Abnormal gait, Decreased coordination, Difficulty walking, Pain, Postural dysfunction  Visit Diagnosis: Other abnormalities of gait and mobility  Unsteadiness on feet  Muscle weakness (generalized)  Ataxic gait  Other symptoms and signs involving the nervous system  Unspecified lack of coordination     Problem List Patient Active Problem List    Diagnosis Date Noted  . Abnormal laboratory test 09/09/2016  . Gait abnormality 08/03/2016  . Weakness 08/03/2016  . Paresthesia 08/03/2016  . S/P cervical spinal fusion 10/23/2015  . Hypersomnia 12/08/2010  . 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  . EPIGASTRIC PAIN 10/13/2009  . IRRITABLE BOWEL SYNDROME 03/10/2009  . BACK PAIN, LUMBAR 07/01/2008  . COSTOCHONDRITIS 02/01/2008  . VIRAL URI 01/08/2008  . CHEST PAIN 01/08/2008  . 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 ARM PAIN 12/25/2007  . ANEMIA-NOS 10/02/2007  . Essential hypertension 10/02/2007  . GERD 10/02/2007  . POLYP, GALLBLADDER 10/02/2007  . UTERINE POLYP 10/02/2007  . OSTEOARTHRITIS 10/02/2007  . Dyskinesia 10/02/2007  . HEADACHE 10/02/2007  . PALPITATIONS 10/02/2007  . CARDIAC MURMUR, HX OF 10/02/2007  . NEPHROLITHIASIS, HX OF 10/02/2007    Rico Junker, PT, DPT 07/20/17    8:25 PM    [facility] [address] [place], [place], [address] Phone: [phone]   Fax:  [phone]  Name: Amy Barry MRN: [id number] Date of Birth: 1970-04-12

## 2017-07-24 DIAGNOSIS — M2550 Pain in unspecified joint: Secondary | ICD-10-CM | POA: Diagnosis not present

## 2017-07-24 DIAGNOSIS — M25561 Pain in right knee: Secondary | ICD-10-CM | POA: Diagnosis not present

## 2017-07-24 DIAGNOSIS — M791 Myalgia, unspecified site: Secondary | ICD-10-CM | POA: Diagnosis not present

## 2017-08-04 ENCOUNTER — Ambulatory Visit: Payer: 59 | Attending: Family Medicine | Admitting: Physical Therapy

## 2017-08-04 ENCOUNTER — Encounter: Payer: Self-pay | Admitting: Physical Therapy

## 2017-08-04 DIAGNOSIS — M6281 Muscle weakness (generalized): Secondary | ICD-10-CM | POA: Diagnosis not present

## 2017-08-04 DIAGNOSIS — Z9181 History of falling: Secondary | ICD-10-CM | POA: Diagnosis present

## 2017-08-04 DIAGNOSIS — R2689 Other abnormalities of gait and mobility: Secondary | ICD-10-CM | POA: Insufficient documentation

## 2017-08-04 DIAGNOSIS — R29818 Other symptoms and signs involving the nervous system: Secondary | ICD-10-CM | POA: Diagnosis present

## 2017-08-04 DIAGNOSIS — R2681 Unsteadiness on feet: Secondary | ICD-10-CM | POA: Insufficient documentation

## 2017-08-04 DIAGNOSIS — R279 Unspecified lack of coordination: Secondary | ICD-10-CM

## 2017-08-04 DIAGNOSIS — R260 Ataxic gait: Secondary | ICD-10-CM

## 2017-08-04 NOTE — Therapy (Signed)
[facility] [address] [place], [place], [address] Phone: [phone]   Fax:  [phone]  Physical Therapy Treatment  Patient Details  Name: Amy Barry MRN: [id number] Date of Birth: 01/24/1971 Referring Provider: Donald Prose, MD   Encounter Date: 08/04/2017  PT End of Session - 08/04/17 1624    Visit Number  69    Number of Visits  20 per recertification    Date for PT Re-Evaluation  28/31/51 per recertification    [facility] - Visit Number  69    Authorization - Number of Visits  60    PT Start Time  1450    PT Stop Time  1540    PT Time Calculation (min)  50 min    Activity Tolerance  Other (comment)    Behavior During Therapy  Anxious       Past Medical History:  Diagnosis Date  . Anemia   . Dysrhythmia    pvc's holter monitor started lopressor- no return visit  . GERD (gastroesophageal reflux disease)   . Headache(784.0)   . Heart murmur    child no echo  . History of kidney stones   . Hypertension   . IBS (irritable bowel syndrome)   . Low vitamin D level   . Lower extremity weakness   . Neuromuscular disorder ([place])   . Numbness and tingling   . Osteoarthritis   . Pneumonia    January- tx with IV antibiotics  for pneumonia in 04/2015   . PVC (premature ventricular contraction)   . Seizure disorder ([place])    denied seizures- has dyskinesia  . Sleep concern 2013   toldf that she doesn 't have apnea but she stays sleeping a lot  . Upper extremity weakness     Past Surgical History:  Procedure Laterality Date  . 2 ovarian cysts     age 51  . CERVICAL DISC ARTHROPLASTY N/A 10/23/2015   Procedure: Cervical Disc Arthroplasty Cervical six-seven;  Surgeon: Eustace Moore, MD;  Location: [facility];  Service: Neurosurgery;  Laterality: N/A;  . ESOPHAGOGASTRODUODENOSCOPY     for GERD  . LAPAROSCOPIC ABDOMINAL EXPLORATION     dx ibs  . TUBAL LIGATION       There were no vitals filed for this visit.  Subjective Assessment - 08/04/17 1455    Subjective  SED rate is still high but physician will not repeat blood work until June.  Physician feels pt may have fibromyalgia.   Is going to pursue counseling for coping.  Did go to the grocery store and walked partially down each aisle.      Limitations  Sitting;Lifting;Standing;Walking;House hold activities    How long can you sit comfortably?  3 hrs prior to onset of pain     Patient Stated Goals  sitting upright, walking, driving, cooking, cleaning, return to work, walk the dog     Currently in Pain?  Yes    Pain Score  7     Pain Location  Generalized    Pain Descriptors / Indicators  Discomfort;Constant    Pain Type  Chronic pain    Pain Onset  More than a month ago         [facility] PT Assessment - 08/04/17 1609      Ambulation/Gait   Ambulation/Gait  Yes    Ambulation/Gait Assistance  4: Min assist    Ambulation/Gait Assistance Details  provided facilitation at pelvis and trunk for upright trunk control in midline and weight shifting through pelvis instead of leaning with her head.      Ambulation Distance (Feet)  115 Feet    Assistive device  Straight cane    Gait Pattern  Step-through pattern;Right steppage;Left steppage;Lateral hip instability;Lateral trunk lean to right;Lateral trunk lean to left    Ambulation Surface  Level;Indoor    Stairs  Yes    Stairs Assistance  5: Supervision;4: Min assist    Stairs Assistance Details (indicate cue type and reason)  supervision for step to sequence, min A for alternating sequence    Stair Management Technique  One rail Left;Alternating pattern;Step to pattern;With cane    Number of Stairs  12    Height of Stairs  6      Berg Balance Test   Standing Unsupported  Able to stand 30 seconds unsupported    From Standing, Reach Forward with Outstretched Arm  Reaches forward but needs supervision    From Standing Position, Pick up Object from  Floor  Able to pick up shoe, needs supervision      Timed Up and Go Test   TUG  Normal TUG    Normal TUG (seconds)  29    TUG Comments  with cane with quad tip                           PT Education - 08/04/17 1623    Education provided  Yes    Education Details  Bioness and plan for next session    Person(s) Educated  Patient    Methods  Explanation    Comprehension  Verbalized understanding       PT Short Term Goals - 06/09/17 1500      PT SHORT TERM GOAL #1   Title  Patient verbalizes and demonstrates understanding of updated HEP and exercise machines at [place]; has not had time to start [employer] since returning to work (from home)    Time  8    Period  Weeks    Status  Partially Met      PT SHORT TERM GOAL #2   Title  Patient maintains upright for 1 minute without UE support with supervision.     Baseline  1:30 with supervision but pt would touch RW handle intermittently for balance    Time  8    Period  Weeks    Status  Partially Met      PT SHORT TERM GOAL #3   Title  Patient ambulates 300' and negotiate indoor ramp/curb with min guard/supervision    Baseline  300' with rollator supervision; ramp and curb with close supervision and verbal cues for sequence with rollator    Time  8    Period  Weeks    Status  Achieved      PT SHORT TERM GOAL #4   Title  Pt negotiates 12 stairs with one rail, alternating sequence with supervision    Baseline  12 with step to sequence, one rail supervision-min guard    Time  8    Period  Weeks    Status  Partially Met        PT Long Term Goals - 08/04/17 1504      PT LONG TERM GOAL #1   Title  Patient will verbalize understanding and return demonstration for final HEP and community  wellness program    Time  16    Period  Weeks    Status  On-going      PT LONG TERM GOAL #2   Title  Patient maintains upright without UE support for 2 minutes with supervision.     Baseline  2 minutes  with min guard and verbal cues to unlock knees; pt closes eyes to focus and concentrate on balance    Time  16    Period  Weeks    Status  Partially Met      PT LONG TERM GOAL #3   Title  Patient reaches 10", scans environment, picks up object from floor with cane or less support safely modified independent.     Baseline  supervision to pick items off the floor with one UE support on cane, 10" forwards with one UE support on cane - supervision    Time  16    Period  Weeks    Status  Partially Met      PT LONG TERM GOAL #4   Title  Patient negotiates 12 stairs with 1 rail, reciprocal pattern supervision at home    Baseline  12 stairs with one rail and cane but step to pattern with supervision, min A for reciprocal pattern    Time  16    Period  Weeks    Status  Partially Met      PT LONG TERM GOAL #5   Title  Timed Up & Go with cane or less </= 16 sec.     Baseline  29 seconds with cane and HHA due to pain in knees today    Time  16    Period  Weeks    Status  Not Met      PT LONG TERM GOAL #6   Title  Patient will improvem FOTO score by >/= 10% to indicate improvement in self-perceived functional mobility.    Baseline  Pt will retake at discharge.    Time  16    Period  Weeks    Status  On-going      PT LONG TERM GOAL #7   Title  Pt will ambulate 300' outside over pavement and curb with rollator and supervision for safe entry/exit at work    Time  16    Period  Weeks    Status  On-going      PT LONG TERM GOAL  #9   TITLE  Berg Balance >36/56     Baseline  23/56    Time  16    Period  Weeks    Status  On-going            Plan - 08/04/17 1624    Clinical Impression Statement  Initiated assessment of progress towards LTG with functional tasks, stairs, gait and balance assessed with use of cane.  During assessment pt became very tearful, anxious and began tapping RLE non stop.  Pt verbalized frustration with the constant pain and fatigue she experiences that has  shown no improvement despite improvements in mobility.  She continues to work at home but has ceased many of her leisure activities and hobbies due to the extreme effort it takes.  Pt verbalized a lack of interest in her normal activities and hobbies.  Pt also reported that the anniversary of the day where she woke up and couldn't move is on the 7th.  Provided pt with emotional support; pt did express that she is going to seek  out the care of a mental health professional for emotional coping.  Pt was able to continue with therapy session.  Discussed with pt plans for next visit - to finalize LTG and plan for next certification period.  Discussed addition of Bioness to treatment interventions.  Pt agreeable.      Rehab Potential  Good    Clinical Impairments Affecting Rehab Potential  HTN, cervical spondylosis, C6-7 L side disc protrusion causing L foraminal impingement, anemia, functional neurological disorder, anemia      PT Frequency  Biweekly    PT Duration  Other (comment) 16 weeks    PT Treatment/Interventions  ADLs/Self Care Home Management;Electrical Stimulation;Neuromuscular re-education;Balance training;Therapeutic exercise;Therapeutic activities;Functional mobility training;Stair training;Gait training;DME Instruction;Patient/family education;Energy conservation;Passive range of motion    PT Next Visit Plan  finish LTG; recertify, use of Bioness.  break up extensor patterns and use of neck extension; standing without use of UE support/weight shifting, weighted belt? gait with cane    Consulted and Agree with Plan of Care  Patient       Patient will benefit from skilled therapeutic intervention in order to improve the following deficits and impairments:  Decreased activity tolerance, Decreased balance, Decreased mobility, Decreased knowledge of use of DME, Decreased endurance, Decreased strength, Impaired flexibility, Impaired tone, Abnormal gait, Decreased coordination, Difficulty walking, Pain,  Postural dysfunction  Visit Diagnosis: Other abnormalities of gait and mobility  Unsteadiness on feet  Muscle weakness (generalized)  Ataxic gait  Other symptoms and signs involving the nervous system  Unspecified lack of coordination  History of falling     Problem List Patient Active Problem List   Diagnosis Date Noted  . Abnormal laboratory test 09/09/2016  . Gait abnormality 08/03/2016  . Weakness 08/03/2016  . Paresthesia 08/03/2016  . S/P cervical spinal fusion 10/23/2015  . Hypersomnia 12/08/2010  . 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  . EPIGASTRIC PAIN 10/13/2009  . IRRITABLE BOWEL SYNDROME 03/10/2009  . BACK PAIN, LUMBAR 07/01/2008  . COSTOCHONDRITIS 02/01/2008  . VIRAL URI 01/08/2008  . CHEST PAIN 01/08/2008  . 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 ARM PAIN 12/25/2007  . ANEMIA-NOS 10/02/2007  . Essential hypertension 10/02/2007  . GERD 10/02/2007  . POLYP, GALLBLADDER 10/02/2007  . UTERINE POLYP 10/02/2007  . OSTEOARTHRITIS 10/02/2007  . Dyskinesia 10/02/2007  . HEADACHE 10/02/2007  . PALPITATIONS 10/02/2007  . CARDIAC MURMUR, HX OF 10/02/2007  . NEPHROLITHIASIS, HX OF 10/02/2007    Amy Barry, PT, DPT 08/04/17    4:34 PM    [facility] [address] [place], [place], [address] Phone: [phone]   Fax:  [phone]  Name: Amy Barry MRN: [id number] Date of Birth: [birth year]

## 2017-08-18 ENCOUNTER — Ambulatory Visit: Payer: 59 | Attending: Family Medicine | Admitting: Physical Therapy

## 2017-08-18 ENCOUNTER — Encounter: Payer: Self-pay | Admitting: Physical Therapy

## 2017-08-18 DIAGNOSIS — R2689 Other abnormalities of gait and mobility: Secondary | ICD-10-CM | POA: Diagnosis not present

## 2017-08-18 DIAGNOSIS — Z9181 History of falling: Secondary | ICD-10-CM

## 2017-08-18 DIAGNOSIS — R2681 Unsteadiness on feet: Secondary | ICD-10-CM | POA: Diagnosis not present

## 2017-08-18 DIAGNOSIS — R260 Ataxic gait: Secondary | ICD-10-CM | POA: Diagnosis present

## 2017-08-18 DIAGNOSIS — M6281 Muscle weakness (generalized): Secondary | ICD-10-CM

## 2017-08-18 DIAGNOSIS — R29818 Other symptoms and signs involving the nervous system: Secondary | ICD-10-CM | POA: Diagnosis present

## 2017-08-18 DIAGNOSIS — R279 Unspecified lack of coordination: Secondary | ICD-10-CM | POA: Diagnosis present

## 2017-08-18 NOTE — Therapy (Signed)
[facility] [address] [place], [place], [address] Phone: [phone]   Fax:  [phone]  Physical Therapy Treatment  Patient Details  Name: Amy Barry MRN: [id number] Date of Birth: Mar 22, 1971 Referring Provider: Donald Prose, MD   Encounter Date: 08/18/2017  PT End of Session - 08/18/17 1654    Visit Number  50    Number of Visits  22 per recertification    Date for PT Re-Evaluation  49/67/59 per recertification    [place] - previous insurance information was incorrect.  $20 co-pay and 20 visit limit for PT per calendar year.      Authorization Time Period  Jan-Dec 2019    Authorization - Visit Number  9    Authorization - Number of Visits  20 for 2019 calendar year    PT Start Time  1456 pt arrived late    PT Stop Time  1535    PT Time Calculation (min)  39 min    Activity Tolerance  Patient tolerated treatment well    Behavior During Therapy  Anxious;WFL for tasks assessed/performed       Past Medical History:  Diagnosis Date  . Anemia   . Dysrhythmia    pvc's holter monitor started lopressor- no return visit  . GERD (gastroesophageal reflux disease)   . Headache(784.0)   . Heart murmur    child no echo  . History of kidney stones   . Hypertension   . IBS (irritable bowel syndrome)   . Low vitamin D level   . Lower extremity weakness   . Neuromuscular disorder ([place])   . Numbness and tingling   . Osteoarthritis   . Pneumonia    January- tx with IV antibiotics  for pneumonia in 04/2015   . PVC (premature ventricular contraction)   . Seizure disorder ([place])    denied seizures- has dyskinesia  . Sleep concern 2013   toldf that she doesn 't have apnea but she stays sleeping a lot  . Upper extremity weakness     Past Surgical History:  Procedure Laterality Date  . 2 ovarian cysts     age 47  . CERVICAL DISC ARTHROPLASTY N/A 10/23/2015   Procedure: Cervical Disc  Arthroplasty Cervical six-seven;  Surgeon: Eustace Moore, MD;  Location: [facility];  Service: Neurosurgery;  Laterality: N/A;  . ESOPHAGOGASTRODUODENOSCOPY     for GERD  . LAPAROSCOPIC ABDOMINAL EXPLORATION     dx ibs  . TUBAL LIGATION      There were no vitals filed for this visit.  Subjective Assessment - 08/18/17 1459    Subjective  Running late today; the floor downstairs at her house is being replaced.  Due to stress pt feels she flared up again; has been running fevers, pain has increased and has been more fatigued.    Limitations  Sitting;Lifting;Standing;Walking;House hold activities    How long can you sit comfortably?  3 hrs prior to onset of pain     Patient Stated Goals  sitting upright, walking, driving, cooking, cleaning, return to work, walk the dog     Currently in Pain?  Yes    Pain Onset  More than a month ago         [facility] PT Assessment - 08/18/17 1511      Berg Balance Test   Sit to Stand  Able to stand  independently using hands    Standing Unsupported  Able to stand  2 minutes with supervision    Sitting with Back Unsupported but Feet Supported on Floor or Stool  Able to sit safely and securely 2 minutes    Stand to Sit  Controls descent by using hands    Transfers  Able to transfer safely, definite need of hands    Standing Unsupported with Eyes Closed  Able to stand 10 seconds with supervision    Standing Ubsupported with Feet Together  Able to place feet together independently and stand for 1 minute with supervision    From Standing, Reach Forward with Outstretched Arm  Can reach forward >12 cm safely (5")    From Standing Position, Pick up Object from [place] to pick up shoe, needs supervision    From Standing Position, Turn to Look Behind Over each Shoulder  Needs supervision when turning    Turn 360 Degrees  Needs assistance while turning for head righting and control    Standing Unsupported, Alternately Place Feet on Step/Stool  Able to complete  >2 steps/needs minimal assist    Standing Unsupported, One Foot in [place] to take small step independently and hold 30 seconds    Standing on One Leg  Tries to lift leg/unable to hold 3 seconds but remains standing independently    Total Score  33    Berg comment:  33/56                   [facility] PT Treatment/Exercise - 08/18/17 1658      Therapeutic Activites    Therapeutic Activities  Other Therapeutic Activities    Other Therapeutic Activities  instructed pt to request from her husband or friend to accompany her and provide supervision while ambulating either on greenway by her house or at a community park to initiate walking program x 5 minutes for endurance training and emotional well being               PT Short Term Goals - 06/09/17 1500      PT SHORT TERM GOAL #1   Title  Patient verbalizes and demonstrates understanding of updated HEP and exercise machines at [place]; has not had time to start [employer] since returning to work (from home)    Time  8    Period  Weeks    Status  Partially Met      PT SHORT TERM GOAL #2   Title  Patient maintains upright for 1 minute without UE support with supervision.     Baseline  1:30 with supervision but pt would touch RW handle intermittently for balance    Time  8    Period  Weeks    Status  Partially Met      PT SHORT TERM GOAL #3   Title  Patient ambulates 300' and negotiate indoor ramp/curb with min guard/supervision    Baseline  300' with rollator supervision; ramp and curb with close supervision and verbal cues for sequence with rollator    Time  8    Period  Weeks    Status  Achieved      PT SHORT TERM GOAL #4   Title  Pt negotiates 12 stairs with one rail, alternating sequence with supervision    Baseline  12 with step to sequence, one rail supervision-min guard    Time  8    Period  Weeks    Status  Partially Met  PT Long Term Goals - 08/18/17 1510      PT LONG  TERM GOAL #1   Title  Patient will verbalize understanding and return demonstration for final HEP and community wellness program    Baseline  Pt performs LE exercise on floor peddling bike but is not performing a walking program or standing exercises    Time  16    Period  Weeks    Status  Not Met      PT LONG TERM GOAL #2   Title  Patient maintains upright without UE support for 2 minutes with supervision.     Baseline  2 minutes with min guard and verbal cues to unlock knees and head position; pt closes eyes to focus and concentrate on balance    Time  16    Period  Weeks    Status  Partially Met      PT LONG TERM GOAL #3   Title  Patient reaches 10", scans environment, picks up object from floor with cane or less support safely modified independent.     Baseline  supervision to pick items off the floor with one UE support on cane, 10" forwards with one UE support on cane - supervision    Time  16    Period  Weeks    Status  Partially Met      PT LONG TERM GOAL #4   Title  Patient negotiates 12 stairs with 1 rail, reciprocal pattern supervision at home    Baseline  12 stairs with one rail and cane but step to pattern with supervision, min A for reciprocal pattern    Time  16    Period  Weeks    Status  Partially Met      PT LONG TERM GOAL #5   Title  Timed Up & Go with cane or less </= 16 sec.     Baseline  29 seconds with cane and HHA due to pain in knees today    Time  16    Period  Weeks    Status  Not Met      PT LONG TERM GOAL #6   Title  Patient will improvem FOTO score by >/= 10% to indicate improvement in self-perceived functional mobility.    Baseline  Pt will retake at discharge.    Time  16    Period  Weeks    Status  On-going      PT LONG TERM GOAL #7   Title  Pt will ambulate 300' outside over pavement and curb with rollator and supervision for safe entry/exit at work    Time  16    Period  Weeks    Status  Achieved      PT LONG TERM GOAL  #9   TITLE   Berg Balance >36/56     Baseline  33/56    Time  16    Period  Weeks    Status  Partially Met            Plan - 08/18/17 1701    Clinical Impression Statement  Completed assessment of LTG with assessment of standing balance and BERG balance test.  Pt continues to make slow but steady progress towards goals and has met 1/8 LTG and partially meeting/progressing towards 3 goals.   As pt has returned to work she has encountered increased stress, fatigue and flare of symptoms limiting patient's overall progress.  Pt does show significant progress within a  therapy session but demonstrates poor carry over session to session due to lack of ability to perform standing exercises or walking program at home.  Strongly encouraged pt to discuss with husband or friend taking her to park or greenway for walking program.  Will also begin to review and revise HEP.  Pt will benefit from ongoing skilled PT services to address impairments in endurance/activity tolerance, trunk/postural control, balance, coordination, motor planning, gait to maximize functional mobility independence and decrease falls risk.      Rehab Potential  Good    Clinical Impairments Affecting Rehab Potential  HTN, cervical spondylosis, C6-7 L side disc protrusion causing L foraminal impingement, anemia, functional neurological disorder, anemia      PT Frequency  Biweekly    PT Duration  12 weeks    PT Treatment/Interventions  ADLs/Self Care Home Management;Electrical Stimulation;Neuromuscular re-education;Balance training;Therapeutic exercise;Therapeutic activities;Functional mobility training;Stair training;Gait training;DME Instruction;Patient/family education;Energy conservation;Passive range of motion    PT Next Visit Plan  use of Bioness.  rockerboard weight shifting.  break up extensor patterns and use of neck extension - keep chin tucked; standing without use of UE support/weight shifting, weighted belt? gait with cane    Consulted and  Agree with Plan of Care  Patient       Patient will benefit from skilled therapeutic intervention in order to improve the following deficits and impairments:  Decreased activity tolerance, Decreased balance, Decreased mobility, Decreased knowledge of use of DME, Decreased endurance, Decreased strength, Impaired flexibility, Impaired tone, Abnormal gait, Decreased coordination, Difficulty walking, Pain, Postural dysfunction, Impaired perceived functional ability  Visit Diagnosis: Other abnormalities of gait and mobility  Unsteadiness on feet  Muscle weakness (generalized)  Ataxic gait  Other symptoms and signs involving the nervous system  Unspecified lack of coordination  History of falling     Problem List Patient Active Problem List   Diagnosis Date Noted  . Abnormal laboratory test 09/09/2016  . Gait abnormality 08/03/2016  . Weakness 08/03/2016  . Paresthesia 08/03/2016  . S/P cervical spinal fusion 10/23/2015  . Hypersomnia 12/08/2010  . 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  . EPIGASTRIC PAIN 10/13/2009  . IRRITABLE BOWEL SYNDROME 03/10/2009  . BACK PAIN, LUMBAR 07/01/2008  . COSTOCHONDRITIS 02/01/2008  . VIRAL URI 01/08/2008  . CHEST PAIN 01/08/2008  . 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 ARM PAIN 12/25/2007  . ANEMIA-NOS 10/02/2007  . Essential hypertension 10/02/2007  . GERD 10/02/2007  . POLYP, GALLBLADDER 10/02/2007  . UTERINE POLYP 10/02/2007  . OSTEOARTHRITIS 10/02/2007  . Dyskinesia 10/02/2007  . HEADACHE 10/02/2007  . PALPITATIONS 10/02/2007  . CARDIAC MURMUR, HX OF 10/02/2007  . NEPHROLITHIASIS, HX OF 10/02/2007    Rico Junker, PT, DPT 08/18/17    5:23 PM    [facility] [address] [place], [place], [address] Phone: [phone]   Fax:  [phone]  Name: Amy Barry MRN: [id number] Date of Birth: 09-22-1970

## 2017-08-25 DIAGNOSIS — R790 Abnormal level of blood mineral: Secondary | ICD-10-CM | POA: Diagnosis not present

## 2017-08-25 DIAGNOSIS — N920 Excessive and frequent menstruation with regular cycle: Secondary | ICD-10-CM | POA: Diagnosis not present

## 2017-08-29 ENCOUNTER — Other Ambulatory Visit: Payer: Self-pay | Admitting: Obstetrics and Gynecology

## 2017-08-29 DIAGNOSIS — N644 Mastodynia: Secondary | ICD-10-CM

## 2017-09-01 ENCOUNTER — Ambulatory Visit: Payer: 59 | Admitting: Physical Therapy

## 2017-09-01 ENCOUNTER — Ambulatory Visit
Admission: RE | Admit: 2017-09-01 | Discharge: 2017-09-01 | Disposition: A | Discharge status: Home / Self Care | Payer: 59 | Source: Ambulatory Visit | Attending: Obstetrics and Gynecology | Admitting: Obstetrics and Gynecology

## 2017-09-01 ENCOUNTER — Encounter: Payer: Self-pay | Admitting: Physical Therapy

## 2017-09-01 DIAGNOSIS — R29818 Other symptoms and signs involving the nervous system: Secondary | ICD-10-CM

## 2017-09-01 DIAGNOSIS — R922 Inconclusive mammogram: Secondary | ICD-10-CM | POA: Diagnosis not present

## 2017-09-01 DIAGNOSIS — Z9181 History of falling: Secondary | ICD-10-CM

## 2017-09-01 DIAGNOSIS — R279 Unspecified lack of coordination: Secondary | ICD-10-CM

## 2017-09-01 DIAGNOSIS — R2689 Other abnormalities of gait and mobility: Secondary | ICD-10-CM | POA: Diagnosis not present

## 2017-09-01 DIAGNOSIS — N6001 Solitary cyst of right breast: Secondary | ICD-10-CM | POA: Diagnosis not present

## 2017-09-01 DIAGNOSIS — N644 Mastodynia: Secondary | ICD-10-CM

## 2017-09-01 DIAGNOSIS — R2681 Unsteadiness on feet: Secondary | ICD-10-CM

## 2017-09-01 DIAGNOSIS — M6281 Muscle weakness (generalized): Secondary | ICD-10-CM

## 2017-09-01 DIAGNOSIS — R260 Ataxic gait: Secondary | ICD-10-CM

## 2017-09-01 NOTE — Therapy (Signed)
[facility] [address] [place], [place], [address] Phone: [phone]   Fax:  [phone]  Physical Therapy Treatment  Patient Details  Name: Amy Barry MRN: [id number] Date of Birth: Aug 07, 1970 Referring Provider: Donald Prose, MD   Encounter Date: 09/01/2017  PT End of Session - 09/01/17 2238    Visit Number  51    Number of Visits  4 per recertification    Date for PT Re-Evaluation  10/93/23 per recertification    [facility] - previous insurance information was incorrect.  $20 co-pay and 20 visit limit for PT per calendar year.      Authorization Time Period  Jan-Dec 2019    Authorization - Visit Number  10    Authorization - Number of Visits  20 for 2019 calendar year    PT Start Time  1445    PT Stop Time  1535    PT Time Calculation (min)  50 min    Activity Tolerance  Patient tolerated treatment well    Behavior During Therapy  [facility] for tasks assessed/performed       Past Medical History:  Diagnosis Date  . Anemia   . Dysrhythmia    pvc's holter monitor started lopressor- no return visit  . GERD (gastroesophageal reflux disease)   . Headache(784.0)   . Heart murmur    child no echo  . History of kidney stones   . Hypertension   . IBS (irritable bowel syndrome)   . Low vitamin D level   . Lower extremity weakness   . Neuromuscular disorder ([place])   . Numbness and tingling   . Osteoarthritis   . Pneumonia    January- tx with IV antibiotics  for pneumonia in 04/2015   . PVC (premature ventricular contraction)   . Seizure disorder ([place])    denied seizures- has dyskinesia  . Sleep concern 2013   toldf that she doesn 't have apnea but she stays sleeping a lot  . Upper extremity weakness     Past Surgical History:  Procedure Laterality Date  . 2 ovarian cysts     age 60  . CERVICAL DISC ARTHROPLASTY N/A 10/23/2015   Procedure: Cervical Disc Arthroplasty Cervical  six-seven;  Surgeon: Eustace Moore, MD;  Location: [facility];  Service: Neurosurgery;  Laterality: N/A;  . ESOPHAGOGASTRODUODENOSCOPY     for GERD  . LAPAROSCOPIC ABDOMINAL EXPLORATION     dx ibs  . TUBAL LIGATION      There were no vitals filed for this visit.  Subjective Assessment - 09/01/17 1451    Subjective  Has not been walking outside due to not feeling good and still running some fevers.  Had a lot of appointments this morning so has done a lot of walking out in the community.    Limitations  Sitting;Lifting;Standing;Walking;House hold activities    How long can you sit comfortably?  3 hrs prior to onset of pain     Patient Stated Goals  sitting upright, walking, driving, cooking, cleaning, return to work, walk the dog     Currently in Pain?  Yes    Pain Score  7     Pain Location  Generalized    Pain Descriptors / Indicators  Constant    Pain Onset  More than a month ago         [facility] Adult PT Treatment/Exercise - 09/01/17 2140      Ambulation/Gait  Ambulation/Gait  Yes    Ambulation/Gait Assistance  3: Mod assist    Ambulation/Gait Assistance Details  without AD but with pt placing UE on therapist's shoulders and therapist providing tactile cues and verbal cues to maintain head and trunk in midline and manual facilitation for proximal hip and pelvic stability during gait; also provided cues for heel strike and increased control with stepping    Ambulation Distance (Feet)  115 Feet    Assistive device  None    Gait Pattern  Step-through pattern;Right steppage;Left steppage;Lateral hip instability;Lateral trunk lean to right;Lateral trunk lean to left    Ambulation Surface  Level;Indoor    Stairs  Yes    Stairs Assistance  4: Min assist    Stairs Assistance Details (indicate cue type and reason)  performed stair negotiation with decreased use of UE to pull as pt ascends; PT provided facilitation at pelvis for forward weight shift and to activate glute med to decrease  use of back extensors and genu recurvatum to ascend.  Also provided cues for weight shifting forwards through knee flexion instead of genu recurvatum and dropping each hip when descending    Stair Management Technique  Two rails;Alternating pattern;Forwards    Number of Stairs  12    Height of Stairs  6      Knee/Hip Exercises: Standing   Lateral Step Up  Right;Left;1 set;10 reps;Hand Hold: 2;Step Height: 4"    Lateral Step Up Limitations  For forward and lateral step ups: therapist provided cues and facilitation at pelvis and trunk for full anterior and lateral weight shift over stance LE and activation of LE ABD and extensor muscles and to decrease use of lumbar extensors to initiate.  Also provided cues for controlled activation to decrease use of genu recurvatum    Forward Step Up  Right;Left;1 set;10 reps;Hand Hold: 2;Step Height: 4"               PT Short Term Goals - 08/20/17 1105      PT SHORT TERM GOAL #1   Title  Patient verbalizes and demonstrates understanding of updated HEP and outdoor walking program with rollator    Time  6    Period  Weeks    Status  Revised    Target Date  10/04/17      PT SHORT TERM GOAL #2   Title  Pt will improve BERG balance score to >/= 36/56    Baseline  33/56    Time  6    Period  Weeks    Status  New    Target Date  10/04/17      PT SHORT TERM GOAL #3   Title  Patient ambulates 230' over level surfaces and negotiate indoor ramp/curb with min guard with straight cane    Time  6    Period  Weeks    Status  Revised    Target Date  10/04/17      PT SHORT TERM GOAL #4   Title  Pt negotiates 12 stairs with one rail and cane, alternating sequence with supervision    Time  6    Period  Weeks    Status  Revised    Target Date  10/04/17      PT SHORT TERM GOAL #5   Title  Pt wil improve TUG with cane to </= 25 seconds    Baseline  29 seconds    Time  6    Period  Weeks  Status  New    Target Date  10/04/17        PT Long  Term Goals - 08/20/17 1110      PT LONG TERM GOAL #1   Title  Patient will perform final HEP and community walking program MOD I    Baseline  Pt performs LE exercise on floor peddling bike but is not performing a walking program or standing exercises    Time  12    Period  Weeks    Status  Revised    Target Date  11/16/17      PT LONG TERM GOAL #2   Title  Patient maintains upright without UE support for 3 minutes at a time with supervision in order to participate in cooking/baking at home.    Baseline  2 minutes with min guard and verbal cues to unlock knees and head position; pt closes eyes to focus and concentrate on balance    Time  12    Period  Weeks    Status  Revised    Target Date  11/16/17      PT LONG TERM GOAL #3   Title  Patient reaches 10", scans environment during gait, picks up object from floor with cane or less support safely modified independent.     Baseline  supervision to pick items off the floor with one UE support on cane, 10" forwards with one UE support on cane - supervision    Time  12    Period  Weeks    Status  Revised    Target Date  11/16/17      PT LONG TERM GOAL #4   Title  Patient negotiates 12 stairs with 1 rail, reciprocal pattern supervision at home    Time  12    Period  Weeks    Status  Revised    Target Date  11/16/17      PT LONG TERM GOAL #5   Title  Timed Up & Go with cane or less </= 20 sec.     Baseline  29 seconds with cane and HHA due to pain in knees today    Time  12    Period  Weeks    Status  Revised    Target Date  11/16/17      PT LONG TERM GOAL #6   Title  Patient will improvem FOTO score by >/= 10% to indicate improvement in self-perceived functional mobility.    Baseline  Pt will retake at discharge.    Time  12    Period  Weeks    Status  On-going    Target Date  11/16/17      PT LONG TERM GOAL #7   Title  Pt will ambulate 300' outside over pavement and up/down curb with cane and supervision    Time  12     Period  Weeks    Status  Revised    Target Date  11/16/17      PT LONG TERM GOAL  #9   TITLE  Berg Balance >40/56     Time  12    Period  Weeks    Status  Revised    Target Date  11/16/17            Plan - 09/01/17 2241    Clinical Impression Statement  Treatment session today focused on use of step ups and stair negotiation to focus on trunk and postural control for increased proximal  stability during distal LE movement and improved sequencing of proximal hip activation, decreasing use of back extensors to initiate movement.  Pt demonstrated improved awareness of pelvic position, sequencing and postural control during step ups with carry over to stairs but during gait pt returned to use of head and trunk extension to initiate stepping sequence and demonstrated increased lateral hip instability despite total verbal and tactile cues from therapist. Will continue to address and will incorporate Bioness training into gait sequencing next session.    Rehab Potential  Good    Clinical Impairments Affecting Rehab Potential  HTN, cervical spondylosis, C6-7 L side disc protrusion causing L foraminal impingement, anemia, functional neurological disorder, anemia      PT Frequency  Biweekly    PT Duration  12 weeks    PT Treatment/Interventions  ADLs/Self Care Home Management;Electrical Stimulation;Neuromuscular re-education;Balance training;Therapeutic exercise;Therapeutic activities;Functional mobility training;Stair training;Gait training;DME Instruction;Patient/family education;Energy conservation;Passive range of motion    PT Next Visit Plan  use of Bioness.  step ups.  rockerboard weight shifting.  break up extensor patterns and use of neck extension - keep chin tucked; standing without use of UE support/weight shifting, weighted belt? gait with cane    Consulted and Agree with Plan of Care  Patient       Patient will benefit from skilled therapeutic intervention in order to improve the  following deficits and impairments:  Decreased activity tolerance, Decreased balance, Decreased mobility, Decreased knowledge of use of DME, Decreased endurance, Decreased strength, Impaired flexibility, Impaired tone, Abnormal gait, Decreased coordination, Difficulty walking, Pain, Postural dysfunction, Impaired perceived functional ability  Visit Diagnosis: Other abnormalities of gait and mobility  Unsteadiness on feet  Muscle weakness (generalized)  Ataxic gait  Other symptoms and signs involving the nervous system  Unspecified lack of coordination  History of falling     Problem List Patient Active Problem List   Diagnosis Date Noted  . Abnormal laboratory test 09/09/2016  . Gait abnormality 08/03/2016  . Weakness 08/03/2016  . Paresthesia 08/03/2016  . S/P cervical spinal fusion 10/23/2015  . Hypersomnia 12/08/2010  . 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  . EPIGASTRIC PAIN 10/13/2009  . IRRITABLE BOWEL SYNDROME 03/10/2009  . BACK PAIN, LUMBAR 07/01/2008  . COSTOCHONDRITIS 02/01/2008  . VIRAL URI 01/08/2008  . CHEST PAIN 01/08/2008  . 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 ARM PAIN 12/25/2007  . ANEMIA-NOS 10/02/2007  . Essential hypertension 10/02/2007  . GERD 10/02/2007  . POLYP, GALLBLADDER 10/02/2007  . UTERINE POLYP 10/02/2007  . OSTEOARTHRITIS 10/02/2007  . Dyskinesia 10/02/2007  . HEADACHE 10/02/2007  . PALPITATIONS 10/02/2007  . CARDIAC MURMUR, HX OF 10/02/2007  . NEPHROLITHIASIS, HX OF 10/02/2007   Rico Junker, PT, DPT 09/01/17    11:18 PM    [facility] [address] [place], [place], [address] Phone: [phone]   Fax:  [phone]  Name: Amy Barry MRN: [id number] Date of Birth: 12/06/1970

## 2017-09-15 ENCOUNTER — Ambulatory Visit: Payer: 59 | Attending: Family Medicine | Admitting: Physical Therapy

## 2017-09-15 ENCOUNTER — Encounter: Payer: Self-pay | Admitting: Physical Therapy

## 2017-09-15 DIAGNOSIS — R2689 Other abnormalities of gait and mobility: Secondary | ICD-10-CM | POA: Diagnosis present

## 2017-09-15 DIAGNOSIS — R279 Unspecified lack of coordination: Secondary | ICD-10-CM | POA: Diagnosis present

## 2017-09-15 DIAGNOSIS — R29818 Other symptoms and signs involving the nervous system: Secondary | ICD-10-CM

## 2017-09-15 DIAGNOSIS — M25561 Pain in right knee: Secondary | ICD-10-CM | POA: Diagnosis not present

## 2017-09-15 DIAGNOSIS — M6281 Muscle weakness (generalized): Secondary | ICD-10-CM | POA: Insufficient documentation

## 2017-09-15 DIAGNOSIS — M791 Myalgia, unspecified site: Secondary | ICD-10-CM | POA: Diagnosis not present

## 2017-09-15 DIAGNOSIS — R2681 Unsteadiness on feet: Secondary | ICD-10-CM | POA: Diagnosis not present

## 2017-09-15 DIAGNOSIS — M2550 Pain in unspecified joint: Secondary | ICD-10-CM | POA: Diagnosis not present

## 2017-09-15 DIAGNOSIS — R260 Ataxic gait: Secondary | ICD-10-CM | POA: Diagnosis present

## 2017-09-16 NOTE — Therapy (Signed)
[facility] [address] [place], [place], [address] Phone: [phone]   Fax:  [phone]  Physical Therapy Treatment  Patient Details  Name: Amy Barry MRN: [id number] Date of Birth: 1970/06/08 Referring Provider: Donald Prose, MD   Encounter Date: 09/15/2017  PT End of Session - 09/15/17 1600    Visit Number  72    Number of Visits  61 per recertification    Date for PT Re-Evaluation  77/82/42 per recertification    [place] - previous insurance information was incorrect.  $20 co-pay and 20 visit limit for PT per calendar year.      Authorization Time Period  Jan-Dec 2019    Authorization - Visit Number  11    Authorization - Number of Visits  20 for 2019 calendar year    PT Start Time  1453    PT Stop Time  1534    PT Time Calculation (min)  41 min    Activity Tolerance  Patient tolerated treatment well    Behavior During Therapy  [facility] for tasks assessed/performed       Past Medical History:  Diagnosis Date  . Anemia   . Dysrhythmia    pvc's holter monitor started lopressor- no return visit  . GERD (gastroesophageal reflux disease)   . Headache(784.0)   . Heart murmur    child no echo  . History of kidney stones   . Hypertension   . IBS (irritable bowel syndrome)   . Low vitamin D level   . Lower extremity weakness   . Neuromuscular disorder ([place])   . Numbness and tingling   . Osteoarthritis   . Pneumonia    January- tx with IV antibiotics  for pneumonia in 04/2015   . PVC (premature ventricular contraction)   . Seizure disorder ([place])    denied seizures- has dyskinesia  . Sleep concern 2013   toldf that she doesn 't have apnea but she stays sleeping a lot  . Upper extremity weakness     Past Surgical History:  Procedure Laterality Date  . 2 ovarian cysts     age 57  . CERVICAL DISC ARTHROPLASTY N/A 10/23/2015   Procedure: Cervical Disc Arthroplasty Cervical  six-seven;  Surgeon: Eustace Moore, MD;  Location: [facility];  Service: Neurosurgery;  Laterality: N/A;  . ESOPHAGOGASTRODUODENOSCOPY     for GERD  . LAPAROSCOPIC ABDOMINAL EXPLORATION     dx ibs  . TUBAL LIGATION      There were no vitals filed for this visit.  Subjective Assessment - 09/15/17 1457    Subjective  Did a lot of walking last weekend going to daughter's graduation and other graduations; did more walking in a store today with rollator without family assistance.  Fatigued now.  Went to rheumatologist today who still needs to do more testing before giving a diagnosis.  Rheumatologist may refer pt to infectious disease or pain specialist.    Limitations  Sitting;Lifting;Standing;Walking;House hold activities    How long can you sit comfortably?  3 hrs prior to onset of pain     Patient Stated Goals  sitting upright, walking, driving, cooking, cleaning, return to work, walk the dog     Pain Onset  More than a month ago                       [facility] Adult PT Treatment/Exercise - 09/16/17 1159  Therapeutic Activites    Therapeutic Activities  Other Therapeutic Activities    Other Therapeutic Activities  Had long discussion educating pt on nature of chronic pain, central sensitization, relationship between emotional stress/anxiety/fear and chronic pain experience, the effect of chronic use of pain medication and adaptation in the brain, current treatment strategies for chronic pain (outside the scope of PT) and emotional coping/stress management.  Pt verbalized understanding and is willing to seek assistance from mental health provider for chronic pain      Modalities   Modalities  Electrical Stimulation      Electrical Stimulation   Electrical Stimulation Location  bilat anterior tibialis and will add bilat hamstrings next session    Electrical Stimulation Action  closed chain ankle DF, hip extension and knee flexion for improved motor control,  timing/sequencing    Electrical Stimulation Parameters  See saved parameters in Tablet 2; initiated set up of Bioness but unable to elicit appropriate contraction in lower leg with quick fit electrodes; will need bilat steering electrodes    Electrical Stimulation Goals  Neuromuscular facilitation             PT Education - 09/16/17 1205    Education provided  Yes    Education Details  See TA    Person(s) Educated  Patient    Methods  Explanation    Comprehension  Verbalized understanding       PT Short Term Goals - 08/20/17 1105      PT SHORT TERM GOAL #1   Title  Patient verbalizes and demonstrates understanding of updated HEP and outdoor walking program with rollator    Time  6    Period  Weeks    Status  Revised    Target Date  10/04/17      PT SHORT TERM GOAL #2   Title  Pt will improve BERG balance score to >/= 36/56    Baseline  33/56    Time  6    Period  Weeks    Status  New    Target Date  10/04/17      PT SHORT TERM GOAL #3   Title  Patient ambulates 230' over level surfaces and negotiate indoor ramp/curb with min guard with straight cane    Time  6    Period  Weeks    Status  Revised    Target Date  10/04/17      PT SHORT TERM GOAL #4   Title  Pt negotiates 12 stairs with one rail and cane, alternating sequence with supervision    Time  6    Period  Weeks    Status  Revised    Target Date  10/04/17      PT SHORT TERM GOAL #5   Title  Pt wil improve TUG with cane to </= 25 seconds    Baseline  29 seconds    Time  6    Period  Weeks    Status  New    Target Date  10/04/17        PT Long Term Goals - 08/20/17 1110      PT LONG TERM GOAL #1   Title  Patient will perform final HEP and community walking program MOD I    Baseline  Pt performs LE exercise on floor peddling bike but is not performing a walking program or standing exercises    Time  12    Period  Weeks    Status  Revised  Target Date  11/16/17      PT LONG TERM GOAL #2    Title  Patient maintains upright without UE support for 3 minutes at a time with supervision in order to participate in cooking/baking at home.    Baseline  2 minutes with min guard and verbal cues to unlock knees and head position; pt closes eyes to focus and concentrate on balance    Time  12    Period  Weeks    Status  Revised    Target Date  11/16/17      PT LONG TERM GOAL #3   Title  Patient reaches 10", scans environment during gait, picks up object from floor with cane or less support safely modified independent.     Baseline  supervision to pick items off the floor with one UE support on cane, 10" forwards with one UE support on cane - supervision    Time  12    Period  Weeks    Status  Revised    Target Date  11/16/17      PT LONG TERM GOAL #4   Title  Patient negotiates 12 stairs with 1 rail, reciprocal pattern supervision at home    Time  12    Period  Weeks    Status  Revised    Target Date  11/16/17      PT LONG TERM GOAL #5   Title  Timed Up & Go with cane or less </= 20 sec.     Baseline  29 seconds with cane and HHA due to pain in knees today    Time  12    Period  Weeks    Status  Revised    Target Date  11/16/17      PT LONG TERM GOAL #6   Title  Patient will improvem FOTO score by >/= 10% to indicate improvement in self-perceived functional mobility.    Baseline  Pt will retake at discharge.    Time  12    Period  Weeks    Status  On-going    Target Date  11/16/17      PT LONG TERM GOAL #7   Title  Pt will ambulate 300' outside over pavement and up/down curb with cane and supervision    Time  12    Period  Weeks    Status  Revised    Target Date  11/16/17      PT LONG TERM GOAL  #9   TITLE  Berg Balance >40/56     Time  12    Period  Weeks    Status  Revised    Target Date  11/16/17            Plan - 09/16/17 1206    Clinical Impression Statement  Treatment session today focused on initiation and set up of functional electrical  stimulation for NMR of bilat LE; unable to elicit effective contraction today, will continue to set up and begin training next session.  Majority of session spent providing pt with education regarding chronic pain and treatment resources.  Pt did demonstrate increased willingness to participate in community outings with family over the past two weeks.  Will continue to address and progress towards LTG.    Rehab Potential  Good    Clinical Impairments Affecting Rehab Potential  HTN, cervical spondylosis, C6-7 L side disc protrusion causing L foraminal impingement, anemia, functional neurological disorder, anemia      PT  Frequency  Biweekly    PT Duration  12 weeks    PT Treatment/Interventions  ADLs/Self Care Home Management;Electrical Stimulation;Neuromuscular re-education;Balance training;Therapeutic exercise;Therapeutic activities;Functional mobility training;Stair training;Gait training;DME Instruction;Patient/family education;Energy conservation;Passive range of motion    PT Next Visit Plan  Did she find counselor? finish set up of Bioness.  step ups.  rockerboard weight shifting.  break up extensor patterns and use of neck extension - keep chin tucked; standing without use of UE support/weight shifting, weighted belt? gait with cane    Consulted and Agree with Plan of Care  Patient       Patient will benefit from skilled therapeutic intervention in order to improve the following deficits and impairments:  Decreased activity tolerance, Decreased balance, Decreased mobility, Decreased knowledge of use of DME, Decreased endurance, Decreased strength, Impaired flexibility, Impaired tone, Abnormal gait, Decreased coordination, Difficulty walking, Pain, Postural dysfunction, Impaired perceived functional ability  Visit Diagnosis: Other abnormalities of gait and mobility  Unsteadiness on feet  Muscle weakness (generalized)  Other symptoms and signs involving the nervous system  Ataxic  gait  Unspecified lack of coordination     Problem List Patient Active Problem List   Diagnosis Date Noted  . Abnormal laboratory test 09/09/2016  . Gait abnormality 08/03/2016  . Weakness 08/03/2016  . Paresthesia 08/03/2016  . S/P cervical spinal fusion 10/23/2015  . Hypersomnia 12/08/2010  . 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  . EPIGASTRIC PAIN 10/13/2009  . IRRITABLE BOWEL SYNDROME 03/10/2009  . BACK PAIN, LUMBAR 07/01/2008  . COSTOCHONDRITIS 02/01/2008  . VIRAL URI 01/08/2008  . CHEST PAIN 01/08/2008  . 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 ARM PAIN 12/25/2007  . ANEMIA-NOS 10/02/2007  . Essential hypertension 10/02/2007  . GERD 10/02/2007  . POLYP, GALLBLADDER 10/02/2007  . UTERINE POLYP 10/02/2007  . OSTEOARTHRITIS 10/02/2007  . Dyskinesia 10/02/2007  . HEADACHE 10/02/2007  . PALPITATIONS 10/02/2007  . CARDIAC MURMUR, HX OF 10/02/2007  . NEPHROLITHIASIS, HX OF 10/02/2007    Rico Junker, PT, DPT 09/16/17    12:16 PM    [facility] [address] [place], [place], [address] Phone: [phone]   Fax:  [phone]  Name: Amy Barry MRN: [id number] Date of Birth: 1971-01-04

## 2017-09-19 DIAGNOSIS — M791 Myalgia, unspecified site: Secondary | ICD-10-CM | POA: Diagnosis not present

## 2017-09-19 DIAGNOSIS — M2550 Pain in unspecified joint: Secondary | ICD-10-CM | POA: Diagnosis not present

## 2017-09-19 DIAGNOSIS — M25561 Pain in right knee: Secondary | ICD-10-CM | POA: Diagnosis not present

## 2017-09-22 DIAGNOSIS — D500 Iron deficiency anemia secondary to blood loss (chronic): Secondary | ICD-10-CM | POA: Diagnosis not present

## 2017-09-27 DIAGNOSIS — D509 Iron deficiency anemia, unspecified: Secondary | ICD-10-CM | POA: Diagnosis not present

## 2017-09-29 ENCOUNTER — Ambulatory Visit: Payer: 59 | Admitting: Physical Therapy

## 2017-10-04 DIAGNOSIS — D500 Iron deficiency anemia secondary to blood loss (chronic): Secondary | ICD-10-CM | POA: Diagnosis not present

## 2017-10-06 DIAGNOSIS — N921 Excessive and frequent menstruation with irregular cycle: Secondary | ICD-10-CM | POA: Diagnosis not present

## 2017-10-16 DIAGNOSIS — N920 Excessive and frequent menstruation with regular cycle: Secondary | ICD-10-CM | POA: Diagnosis not present

## 2017-10-16 DIAGNOSIS — Z9889 Other specified postprocedural states: Secondary | ICD-10-CM | POA: Diagnosis not present

## 2017-10-18 ENCOUNTER — Ambulatory Visit: Payer: 59 | Attending: Family Medicine | Admitting: Physical Therapy

## 2017-10-18 ENCOUNTER — Encounter: Payer: Self-pay | Admitting: Physical Therapy

## 2017-10-18 DIAGNOSIS — R260 Ataxic gait: Secondary | ICD-10-CM | POA: Diagnosis present

## 2017-10-18 DIAGNOSIS — R2689 Other abnormalities of gait and mobility: Secondary | ICD-10-CM

## 2017-10-18 DIAGNOSIS — R279 Unspecified lack of coordination: Secondary | ICD-10-CM | POA: Insufficient documentation

## 2017-10-18 DIAGNOSIS — M6281 Muscle weakness (generalized): Secondary | ICD-10-CM | POA: Diagnosis present

## 2017-10-18 DIAGNOSIS — R2681 Unsteadiness on feet: Secondary | ICD-10-CM | POA: Insufficient documentation

## 2017-10-18 DIAGNOSIS — R29818 Other symptoms and signs involving the nervous system: Secondary | ICD-10-CM | POA: Diagnosis present

## 2017-10-19 NOTE — Therapy (Signed)
[facility] [address] [place], [place], [address] Phone: [phone]   Fax:  [phone]  Physical Therapy Treatment  Patient Details  Name: Amy Barry MRN: [id number] Date of Birth: 28-Jun-1970 Referring Provider: Donald Prose, MD   Encounter Date: 10/18/2017  PT End of Session - 10/18/17 1912    Visit Number  63    Number of Visits  21 per recertification    Date for PT Re-Evaluation  34/28/76 per recertification    [place] - previous insurance information was incorrect.  $20 co-pay and 20 visit limit for PT per calendar year.      Authorization Time Period  Jan-Dec 2019    Authorization - Visit Number  12    Authorization - Number of Visits  20 for 2019 calendar year    PT Start Time  1531    PT Stop Time  1624    PT Time Calculation (min)  53 min    Equipment Utilized During Treatment  Gait belt    Activity Tolerance  Patient tolerated treatment well    Behavior During Therapy  WFL for tasks assessed/performed       Past Medical History:  Diagnosis Date  . Anemia   . Dysrhythmia    pvc's holter monitor started lopressor- no return visit  . GERD (gastroesophageal reflux disease)   . Headache(784.0)   . Heart murmur    child no echo  . History of kidney stones   . Hypertension   . IBS (irritable bowel syndrome)   . Low vitamin D level   . Lower extremity weakness   . Neuromuscular disorder ([facility])   . Numbness and tingling   . Osteoarthritis   . Pneumonia    January- tx with IV antibiotics  for pneumonia in 04/2015   . PVC (premature ventricular contraction)   . Seizure disorder ([facility])    denied seizures- has dyskinesia  . Sleep concern 2013   toldf that she doesn 't have apnea but she stays sleeping a lot  . Upper extremity weakness     Past Surgical History:  Procedure Laterality Date  . 2 ovarian cysts     age 53  . CERVICAL DISC ARTHROPLASTY N/A 10/23/2015   Procedure: Cervical Disc Arthroplasty Cervical six-seven;  Surgeon: Eustace Moore, MD;  Location: [facility];  Service: Neurosurgery;  Laterality: N/A;  . ESOPHAGOGASTRODUODENOSCOPY     for GERD  . LAPAROSCOPIC ABDOMINAL EXPLORATION     dx ibs  . TUBAL LIGATION      There were no vitals filed for this visit.  Subjective Assessment - 10/18/17 1536    Subjective  Her iron level was severely low so got 2 iron infusions. She is waiting on lab work from rheumatologist for diagnosis.     Limitations  Sitting;Lifting;Standing;Walking;House hold activities    How long can you sit comfortably?  3 hrs prior to onset of pain     Patient Stated Goals  sitting upright, walking, driving, cooking, cleaning, return to work, walk the dog     Currently in Pain?  Yes    Pain Score  8     Pain Location  Generalized    Pain Orientation  Lower;Upper thoracic & arms to hands are worst today    Pain Descriptors / Indicators  Aching;Burning    Pain Type  Chronic pain    Pain Onset  More than a month ago  Pain Frequency  Constant    Aggravating Factors   cold, being on menstrual cycle    Pain Relieving Factors  warm shower         [facility] PT Assessment - 10/18/17 1535      Berg Balance Test   Sit to Stand  Able to stand  independently using hands    Standing Unsupported  Needs several tries to stand 30 seconds unsupported    Sitting with Back Unsupported but Feet Supported on Floor or Stool  Able to sit safely and securely 2 minutes    Stand to Sit  Controls descent by using hands    Transfers  Able to transfer safely, definite need of hands    Standing Unsupported with Eyes Closed  Able to stand 10 seconds with supervision    Standing Ubsupported with Feet Together  Needs help to attain position but able to stand for 30 seconds with feet together    From Standing, Reach Forward with Outstretched Arm  Can reach forward >12 cm safely (5")    From Standing Position, Pick up Object from Floor  Able to  pick up shoe, needs supervision    From Standing Position, Turn to Look Behind Over each Shoulder  Turn sideways only but maintains balance    Turn 360 Degrees  Needs assistance while turning    Standing Unsupported, Alternately Place Feet on Step/Stool  Able to complete >2 steps/needs minimal assist    Standing Unsupported, One Foot in Front  Able to take small step independently and hold 30 seconds    Standing on One Leg  Tries to lift leg/unable to hold 3 seconds but remains standing independently    Total Score  30      Timed Up and Go Test   Normal TUG (seconds)  18.34 cane with min guard; was 29sec with SBQC on 5/3                   [facility] Adult PT Treatment/Exercise - 10/18/17 1530      Ambulation/Gait   Ambulation/Gait  Yes    Ambulation/Gait Assistance  3: Mod assist;4: Min assist MinA initially & straight path w/o scan, modA w/scan & turns    Ambulation/Gait Assistance Details  Verbal & tactile cues on upright posture, looking forward where ambulating, LE coordination     Ambulation Distance (Feet)  230 Feet    Assistive device  Straight cane;1 person hand held assist    Gait Pattern  Step-through pattern;Decreased dorsiflexion - left;Right steppage;Left steppage;Right genu recurvatum;Left genu recurvatum;Ataxic;Poor foot clearance - left;Poor foot clearance - right;Wide base of support    Ambulation Surface  Indoor;Level    Stairs  Yes    Stairs Assistance  4: Min assist;4: Min guard;5: Supervision progressed to supervision with cues    Stairs Assistance Details (indicate cue type and reason)  foot position to limit abduction, wt shift over stance limb with pelvis and descending with ankle PF to make contact early without excessive contralateral knee flexion    Stair Management Technique  One rail Left;With cane;Alternating pattern;Forwards    Number of Stairs  16 4 reps of 4 steps    Height of Stairs  6    Ramp  3: Mod assist;4: Min assist cane & hand hold assist,  1st attempt modA, 2nd & 3rd minA    Ramp Details (indicate cue type and reason)  verbal & tactile/manual cues on upright posture, wt shift & balance reactions  Curb  3: Mod assist;4: Min assist cane & hand hold assist, 1st attempt modA, 2nd & 3rd minA    Curb Details (indicate cue type and reason)  verbal, demo & tactile cues on wt shift, sequence, foot position prior to stepping up/down with contralateral LE and stepping down with ankle PF motion to contact floor sooner      Neuro Re-ed    Neuro Re-ed Details   standing balance standing back, pelvis, upper back and head in contact with door frame: alternate LE forward kicks and alternate hip abduction 10 reps ea.              PT Education - 10/18/17 1600    Education provided  Yes    Education Details  discussed pool exercising including use of lift chair as option, use of water shoes or grip socks to protect feet,     Person(s) Educated  Patient    Methods  Explanation;Verbal cues    Comprehension  Verbalized understanding;Need further instruction       PT Short Term Goals - 10/19/17 1249      PT SHORT TERM GOAL #1   Title  Patient verbalizes and demonstrates understanding of updated HEP and outdoor walking program with rollator    Baseline  MET 10/18/2017    Time  6    Period  Weeks    Status  Achieved      PT SHORT TERM GOAL #2   Title  Pt will improve BERG balance score to >/= 36/56    Baseline  NOT MET 10/18/2017  Merrilee Jansky Balance 30/56    Time  6    Period  Weeks    Status  Not Met      PT SHORT TERM GOAL #3   Title  Patient ambulates 230' over level surfaces and negotiate indoor ramp/curb with min guard with straight cane    Baseline  NOT MET 10/18/2017  Patient requires minA level surfaces and mod to minA for ramps/curbs with cane & hand hold assist.     Time  6    Period  Weeks    Status  Not Met      PT SHORT TERM GOAL #4   Title  Pt negotiates 12 stairs with one rail and cane, alternating sequence with  supervision    Baseline  MET 10/18/2017    Time  6    Period  Weeks    Status  Achieved      PT SHORT TERM GOAL #5   Title  Pt wil improve TUG with cane to </= 25 seconds    Baseline  MET 10/18/2017  TUG with cane with min guard assist 18.34sec    Time  6    Period  Weeks    Status  Achieved        PT Long Term Goals - 08/20/17 1110      PT LONG TERM GOAL #1   Title  Patient will perform final HEP and community walking program MOD I    Baseline  Pt performs LE exercise on floor peddling bike but is not performing a walking program or standing exercises    Time  12    Period  Weeks    Status  Revised    Target Date  11/16/17      PT LONG TERM GOAL #2   Title  Patient maintains upright without UE support for 3 minutes at a time with supervision in order to participate  in cooking/baking at home.    Baseline  2 minutes with min guard and verbal cues to unlock knees and head position; pt closes eyes to focus and concentrate on balance    Time  12    Period  Weeks    Status  Revised    Target Date  11/16/17      PT LONG TERM GOAL #3   Title  Patient reaches 10", scans environment during gait, picks up object from floor with cane or less support safely modified independent.     Baseline  supervision to pick items off the floor with one UE support on cane, 10" forwards with one UE support on cane - supervision    Time  12    Period  Weeks    Status  Revised    Target Date  11/16/17      PT LONG TERM GOAL #4   Title  Patient negotiates 12 stairs with 1 rail, reciprocal pattern supervision at home    Time  12    Period  Weeks    Status  Revised    Target Date  11/16/17      PT LONG TERM GOAL #5   Title  Timed Up & Go with cane or less </= 20 sec.     Baseline  29 seconds with cane and HHA due to pain in knees today    Time  12    Period  Weeks    Status  Revised    Target Date  11/16/17      PT LONG TERM GOAL #6   Title  Patient will improvem FOTO score by >/= 10% to  indicate improvement in self-perceived functional mobility.    Baseline  Pt will retake at discharge.    Time  12    Period  Weeks    Status  On-going    Target Date  11/16/17      PT LONG TERM GOAL #7   Title  Pt will ambulate 300' outside over pavement and up/down curb with cane and supervision    Time  12    Period  Weeks    Status  Revised    Target Date  11/16/17      PT LONG TERM GOAL  #9   TITLE  Berg Balance >40/56     Time  12    Period  Weeks    Status  Revised    Target Date  11/16/17            Plan - 10/18/17 1252    Clinical Impression Statement  Patient met 3 of 5 STGs checked today. Patient continues to have ataxic LE movements which impairs balance during gait. She improved negotiation of stairs, ramps & curbs with skilled instruction.     Rehab Potential  Good    Clinical Impairments Affecting Rehab Potential  HTN, cervical spondylosis, C6-7 L side disc protrusion causing L foraminal impingement, anemia, functional neurological disorder, anemia      PT Frequency  Biweekly    PT Duration  12 weeks    PT Treatment/Interventions  ADLs/Self Care Home Management;Electrical Stimulation;Neuromuscular re-education;Balance training;Therapeutic exercise;Therapeutic activities;Functional mobility training;Stair training;Gait training;DME Instruction;Patient/family education;Energy conservation;Passive range of motion    PT Next Visit Plan  Did she find counselor? finish set up of [employer].  step ups.  rockerboard weight shifting.  break up extensor patterns and use of neck extension - keep chin tucked; standing without use of UE support/weight shifting,  weighted belt? gait with cane    Consulted and Agree with Plan of Care  Patient       Patient will benefit from skilled therapeutic intervention in order to improve the following deficits and impairments:  Decreased activity tolerance, Decreased balance, Decreased mobility, Decreased knowledge of use of DME, Decreased  endurance, Decreased strength, Impaired flexibility, Impaired tone, Abnormal gait, Decreased coordination, Difficulty walking, Pain, Postural dysfunction, Impaired perceived functional ability  Visit Diagnosis: Other abnormalities of gait and mobility  Unsteadiness on feet  Muscle weakness (generalized)  Other symptoms and signs involving the nervous system  Ataxic gait  Unspecified lack of coordination     Problem List Patient Active Problem List   Diagnosis Date Noted  . Abnormal laboratory test 09/09/2016  . Gait abnormality 08/03/2016  . Weakness 08/03/2016  . Paresthesia 08/03/2016  . S/P cervical spinal fusion 10/23/2015  . Hypersomnia 12/08/2010  . 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  . EPIGASTRIC PAIN 10/13/2009  . IRRITABLE BOWEL SYNDROME 03/10/2009  . BACK PAIN, LUMBAR 07/01/2008  . COSTOCHONDRITIS 02/01/2008  . VIRAL URI 01/08/2008  . CHEST PAIN 01/08/2008  . 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 ARM PAIN 12/25/2007  . ANEMIA-NOS 10/02/2007  . Essential hypertension 10/02/2007  . GERD 10/02/2007  . POLYP, GALLBLADDER 10/02/2007  . UTERINE POLYP 10/02/2007  . OSTEOARTHRITIS 10/02/2007  . Dyskinesia 10/02/2007  . HEADACHE 10/02/2007  . PALPITATIONS 10/02/2007  . CARDIAC MURMUR, HX OF 10/02/2007  . NEPHROLITHIASIS, HX OF 10/02/2007    Jamey Reas PT, DPT 10/19/2017, 12:54 PM  [facility] [address] [place], [place], [address] Phone: [phone]   Fax:  [phone]  Name: Amy Barry MRN: [id number] Date of Birth: 02-22-1971

## 2017-10-31 DIAGNOSIS — M2550 Pain in unspecified joint: Secondary | ICD-10-CM | POA: Diagnosis not present

## 2017-10-31 DIAGNOSIS — M25561 Pain in right knee: Secondary | ICD-10-CM | POA: Diagnosis not present

## 2017-10-31 DIAGNOSIS — M359 Systemic involvement of connective tissue, unspecified: Secondary | ICD-10-CM | POA: Diagnosis not present

## 2017-10-31 DIAGNOSIS — M791 Myalgia, unspecified site: Secondary | ICD-10-CM | POA: Diagnosis not present

## 2017-11-03 ENCOUNTER — Ambulatory Visit: Payer: 59 | Attending: Family Medicine | Admitting: Physical Therapy

## 2017-11-03 ENCOUNTER — Encounter: Payer: Self-pay | Admitting: Physical Therapy

## 2017-11-03 DIAGNOSIS — R29818 Other symptoms and signs involving the nervous system: Secondary | ICD-10-CM

## 2017-11-03 DIAGNOSIS — R279 Unspecified lack of coordination: Secondary | ICD-10-CM

## 2017-11-03 DIAGNOSIS — Z9181 History of falling: Secondary | ICD-10-CM

## 2017-11-03 DIAGNOSIS — R2689 Other abnormalities of gait and mobility: Secondary | ICD-10-CM | POA: Diagnosis not present

## 2017-11-03 DIAGNOSIS — M6281 Muscle weakness (generalized): Secondary | ICD-10-CM

## 2017-11-03 DIAGNOSIS — R260 Ataxic gait: Secondary | ICD-10-CM | POA: Diagnosis present

## 2017-11-03 DIAGNOSIS — R2681 Unsteadiness on feet: Secondary | ICD-10-CM

## 2017-11-03 NOTE — Patient Instructions (Signed)
   Recommendations:   - Raise monitor (prop up on a box) to eye level  - Elbows at 90 degrees and keyboard in front - you may have to raise your chair slightly and then put yoga blocks under feet so hips, knees and ankles are at 90 deg.

## 2017-11-03 NOTE — Therapy (Signed)
[facility] [address] [place], [place], [address] Phone: [phone]   Fax:  [phone]  Physical Therapy Treatment  Patient Details  Name: Amy Barry MRN: [id number] Date of Birth: 10/07/1970 Referring Provider: Vyvyan Sun, MD   Encounter Date: 11/03/2017  PT End of Session - 11/03/17 1602    Visit Number  54    Number of Visits  62 per recertification    Date for PT Re-Evaluation  11/16/17 re-assess in september/october - pt on hold for now    Authorization Type  [employer] - previous insurance information was incorrect.  $20 co-pay and 20 visit limit for PT per calendar year.    Secondary [employer] benefit period June 1 - May 31; VL: combined PT and OT: 60 hard max    Authorization Time Period  Jan-Dec 2019    Authorization - Visit Number  13    Authorization - Number of Visits  20 for 2019 calendar year    PT Start Time  1445    PT Stop Time  1515    PT Time Calculation (min)  30 min    Activity Tolerance  Patient tolerated treatment well    Behavior During Therapy  WFL for tasks assessed/performed       Past Medical History:  Diagnosis Date  . Anemia   . Dysrhythmia    pvc's holter monitor started lopressor- no return visit  . GERD (gastroesophageal reflux disease)   . Headache(784.0)   . Heart murmur    child no echo  . History of kidney stones   . Hypertension   . IBS (irritable bowel syndrome)   . Low vitamin D level   . Lower extremity weakness   . Neuromuscular disorder ([facility])   . Numbness and tingling   . Osteoarthritis   . Pneumonia    January- tx with IV antibiotics  for pneumonia in 04/2015   . PVC (premature ventricular contraction)   . Seizure disorder ([facility])    denied seizures- has dyskinesia  . Sleep concern 2013   toldf that she doesn 't have apnea but she stays sleeping a lot  . Upper extremity weakness     Past Surgical History:  Procedure Laterality Date  . 2 ovarian cysts      age 13  . CERVICAL DISC ARTHROPLASTY N/A 10/23/2015   Procedure: Cervical Disc Arthroplasty Cervical six-seven;  Surgeon: David S Jones, MD;  Location: [facility];  Service: Neurosurgery;  Laterality: N/A;  . ESOPHAGOGASTRODUODENOSCOPY     for GERD  . LAPAROSCOPIC ABDOMINAL EXPLORATION     dx ibs  . TUBAL LIGATION      There were no vitals filed for this visit.  Subjective Assessment - 11/03/17 1450    Subjective  Pt got blood results back and she was diagnosed with Lupus.  Started on steroid taper.      Limitations  Sitting;Lifting;Standing;Walking;House hold activities    How long can you sit comfortably?  3 hrs prior to onset of pain     Patient Stated Goals  sitting upright, walking, driving, cooking, cleaning, return to work, walk the dog     Currently in Pain?  Yes    Pain Onset  More than a month ago                       [facility] Adult PT Treatment/Exercise - 11/03/17 1554      Therapeutic Activites      Therapeutic Activities  Other Therapeutic Activities;Work Economist    Work Economist  reviewed picture of patient's work station at home.  Recommendations made below.      Other Therapeutic Activities  Long discussion with pt regarding diagnosis and placing pt on hold to conserve visits and allow medication to take effect in order to decrease inflammation and improve patient tolerance and response to therapy.  Pt in agreement with plan.           Recommendations:   - Raise monitor (prop up on a box) to eye level  - Elbows at 90 degrees and keyboard in front - you may have to raise your chair slightly and then put yoga blocks under feet so hips, knees and ankles are at 90 deg.       PT Education - 11/03/17 1602    Education provided  Yes    Education Details  on hold until end of September/early October to allow medication to take effect and maximize therapy benefits/visits    Person(s) Educated  Patient    Methods  Explanation    Comprehension   Verbalized understanding       PT Short Term Goals - 10/19/17 1249      PT SHORT TERM GOAL #1   Title  Patient verbalizes and demonstrates understanding of updated HEP and outdoor walking program with rollator    Baseline  MET 10/18/2017    Time  6    Period  Weeks    Status  Achieved      PT SHORT TERM GOAL #2   Title  Pt will improve BERG balance score to >/= 36/56    Baseline  NOT MET 10/18/2017  Merrilee Jansky Balance 30/56    Time  6    Period  Weeks    Status  Not Met      PT SHORT TERM GOAL #3   Title  Patient ambulates 230' over level surfaces and negotiate indoor ramp/curb with min guard with straight cane    Baseline  NOT MET 10/18/2017  Patient requires minA level surfaces and mod to minA for ramps/curbs with cane & hand hold assist.     Time  6    Period  Weeks    Status  Not Met      PT SHORT TERM GOAL #4   Title  Pt negotiates 12 stairs with one rail and cane, alternating sequence with supervision    Baseline  MET 10/18/2017    Time  6    Period  Weeks    Status  Achieved      PT SHORT TERM GOAL #5   Title  Pt wil improve TUG with cane to </= 25 seconds    Baseline  MET 10/18/2017  TUG with cane with min guard assist 18.34sec    Time  6    Period  Weeks    Status  Achieved        PT Long Term Goals - 08/20/17 1110      PT LONG TERM GOAL #1   Title  Patient will perform final HEP and community walking program MOD I    Baseline  Pt performs LE exercise on floor peddling bike but is not performing a walking program or standing exercises    Time  12    Period  Weeks    Status  Revised    Target Date  11/16/17      PT LONG TERM GOAL #2   Title  Patient maintains upright without UE support for 3 minutes at a time with supervision in order to participate in cooking/baking at home.    Baseline  2 minutes with min guard and verbal cues to unlock knees and head position; pt closes eyes to focus and concentrate on balance    Time  12    Period  Weeks    Status  Revised     Target Date  11/16/17      PT LONG TERM GOAL #3   Title  Patient reaches 10", scans environment during gait, picks up object from floor with cane or less support safely modified independent.     Baseline  supervision to pick items off the floor with one UE support on cane, 10" forwards with one UE support on cane - supervision    Time  12    Period  Weeks    Status  Revised    Target Date  11/16/17      PT LONG TERM GOAL #4   Title  Patient negotiates 12 stairs with 1 rail, reciprocal pattern supervision at home    Time  12    Period  Weeks    Status  Revised    Target Date  11/16/17      PT LONG TERM GOAL #5   Title  Timed Up & Go with cane or less </= 20 sec.     Baseline  29 seconds with cane and HHA due to pain in knees today    Time  12    Period  Weeks    Status  Revised    Target Date  11/16/17      PT LONG TERM GOAL #6   Title  Patient will improvem FOTO score by >/= 10% to indicate improvement in self-perceived functional mobility.    Baseline  Pt will retake at discharge.    Time  12    Period  Weeks    Status  On-going    Target Date  11/16/17      PT LONG TERM GOAL #7   Title  Pt will ambulate 300' outside over pavement and up/down curb with cane and supervision    Time  12    Period  Weeks    Status  Revised    Target Date  11/16/17      PT LONG TERM GOAL  #9   TITLE  Berg Balance >40/56     Time  12    Period  Weeks    Status  Revised    Target Date  11/16/17            Plan - 11/03/17 1604    Clinical Impression Statement  Treatment session today spent discussing patient diagnosis of Lupus and implications for work and therapy.  Pt brought in pictures of work station at home.  Provided pt with recommendations for improved posture and energy conservation when working.  Due to pt just starting medication today for Lupus, therapist is recommending putting pt on hold for 2 months to allow medication to take effect in order to maximize therapy  visits.  Pt agreeable with plan.  Pt will continue HEP and doing exercises in the pool with her family.    Rehab Potential  Good    Clinical Impairments Affecting Rehab Potential  HTN, cervical spondylosis, C6-7 L side disc protrusion causing L foraminal impingement, anemia, functional neurological disorder, anemia      PT Duration  12 weeks      PT Treatment/Interventions  ADLs/Self Care Home Management;Electrical Stimulation;Neuromuscular re-education;Balance training;Therapeutic exercise;Therapeutic activities;Functional mobility training;Stair training;Gait training;DME Instruction;Patient/family education;Energy conservation;Passive range of motion    PT Next Visit Plan  On hold until end of september/early october.  Needs re-assessment and recertification.  How is she doing on medication for lupus? Did rheumatologist send back clearance to use [employer]?    Consulted and Agree with Plan of Care  Patient       Patient will benefit from skilled therapeutic intervention in order to improve the following deficits and impairments:  Decreased activity tolerance, Decreased balance, Decreased mobility, Decreased knowledge of use of DME, Decreased endurance, Decreased strength, Impaired flexibility, Impaired tone, Abnormal gait, Decreased coordination, Difficulty walking, Pain, Postural dysfunction, Impaired perceived functional ability  Visit Diagnosis: Other abnormalities of gait and mobility  Unsteadiness on feet  Muscle weakness (generalized)  Other symptoms and signs involving the nervous system  Ataxic gait  Unspecified lack of coordination  History of falling     Problem List Patient Active Problem List   Diagnosis Date Noted  . Abnormal laboratory test 09/09/2016  . Gait abnormality 08/03/2016  . Weakness 08/03/2016  . Paresthesia 08/03/2016  . S/P cervical spinal fusion 10/23/2015  . Hypersomnia 12/08/2010  . 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  . EPIGASTRIC PAIN 10/13/2009  .  IRRITABLE BOWEL SYNDROME 03/10/2009  . BACK PAIN, LUMBAR 07/01/2008  . COSTOCHONDRITIS 02/01/2008  . VIRAL URI 01/08/2008  . CHEST PAIN 01/08/2008  . 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 ARM PAIN 12/25/2007  . ANEMIA-NOS 10/02/2007  . Essential hypertension 10/02/2007  . GERD 10/02/2007  . POLYP, GALLBLADDER 10/02/2007  . UTERINE POLYP 10/02/2007  . OSTEOARTHRITIS 10/02/2007  . Dyskinesia 10/02/2007  . HEADACHE 10/02/2007  . PALPITATIONS 10/02/2007  . CARDIAC MURMUR, HX OF 10/02/2007  . NEPHROLITHIASIS, HX OF 10/02/2007     F , PT, DPT 11/03/17    4:09 PM    [facility] [address] [place], [place], [address] Phone: [phone]   Fax:  [phone]  Name: Amy Barry MRN: [id number] Date of Birth: 03/15/1971   

## 2017-11-15 ENCOUNTER — Encounter (HOSPITAL_COMMUNITY): Payer: Self-pay | Admitting: *Deleted

## 2017-11-17 ENCOUNTER — Ambulatory Visit: Payer: 59 | Admitting: Physical Therapy

## 2017-12-01 ENCOUNTER — Ambulatory Visit: Payer: 59 | Admitting: Physical Therapy

## 2017-12-05 ENCOUNTER — Inpatient Hospital Stay (HOSPITAL_COMMUNITY): Admission: RE | Admit: 2017-12-05 | Payer: 59 | Source: Ambulatory Visit

## 2017-12-05 ENCOUNTER — Other Ambulatory Visit: Payer: Self-pay | Admitting: Obstetrics and Gynecology

## 2017-12-05 DIAGNOSIS — N920 Excessive and frequent menstruation with regular cycle: Secondary | ICD-10-CM | POA: Diagnosis not present

## 2017-12-05 DIAGNOSIS — M359 Systemic involvement of connective tissue, unspecified: Secondary | ICD-10-CM | POA: Diagnosis not present

## 2017-12-05 DIAGNOSIS — D219 Benign neoplasm of connective and other soft tissue, unspecified: Secondary | ICD-10-CM | POA: Diagnosis not present

## 2017-12-05 DIAGNOSIS — M791 Myalgia, unspecified site: Secondary | ICD-10-CM | POA: Diagnosis not present

## 2017-12-05 DIAGNOSIS — M2550 Pain in unspecified joint: Secondary | ICD-10-CM | POA: Diagnosis not present

## 2017-12-05 NOTE — H&P (Signed)
Patient:Amy Barry, Amy Barry DOB: 1970-04-09 Age: 47 Y Sex: Female Scribe  Phone: [phone] Primary Insurance: UHC Payer ID: [id number] Address: [address] [place] Account Number: [id number] PCP: Marilynne Drivers Encounter Date: 12/05/2017 Provider: Christophe Louis, MD Appointment [facility]: [facility]   Subjective: Chief Complaint(s):   Heavy menses and fibroids   HPI:  General 47 y/o presents for preop history and physical in prepearation for robotic assisted laparoscopic hysterectomy with bilateral salpingectomy as definitive management for menorrhagia and uterine fibroids. her menses are very heavy . her last menses lasting 4 days. she has very large clots and heavy bleeding. she changes a super plus tampon 3 times in an hour on her heaviest day. 2 days are heavy out of 4.  she has tried lysteda which caused nausea and emesis. she tried mirena IUD however it fell out. She tried depo and this caused mood swings. she is interested in definitive therapy via hysterectomy.  ultrasound performed 07/11/2016 revealed an 9 cm x 5 cm x 6 cm uterus. mutiple fibroids were seen the largest is 2.6 cm. the ovaries were normal bilaterally. Current Medication: Taking  Omeprazole 40 MG Capsule Delayed Release 1 capsule Orally Once a day     Metoprolol Tartrate 100 MG Tablet 1 tablet with food Orally Twice a day     Furosemide 40 MG Tablet 1 tablet Orally Once a day     Potassium Chloride Crys ER 10 MEQ Tablet Extended Release 1 tablet with food Orally Once a day     Meloxicam 15 MG Tablet 1 tablet Orally Once a day, Notes: prn     Ibuprofen 400 MG Tablet 1 tablet with food or milk as needed Orally Three times a day     Plaquenil(Hydroxychloroquine Sulfate) 200 MG Tablet 1 tablet Orally twice a day   Not-Taking  Lysteda(Tranexamic Acid) 650 MG Tablet 2 tablets Orally 3 times a day during menses     Lexapro(Escitalopram Oxalate) 10 MG Tablet 1/2 tablet once a day x 2-4 weeks and then increase to 1  tablet daily Orally     Medication List reviewed and reconciled with the patient   Medical History:  hypertension     functional neurologic disorder with generalized weakness     GERD     IBS     cervical DDD on MRI (2017)     vitamin D deficiency     iron deficiency anemia     Chronic pain: elevated sed rate     Lupus - Dr.Syed (dxed 7/19)      Allergies/Intolerance:  Metoclopramide HCl - shakiness     Sumatriptan - high bp     Tramadol HCl - nausea     Codeine (for allergy) - palpitations     Meperidine HCl - nausea/ Vomiting     Cymbalta - irritable     Prozac - jittery   Gyn History:  Sexual activity currently sexually active. Periods : every month. LMP 11/26/17. Birth control BTL. Last pap smear date 06/23/2016 negaive HPV. Last mammogram date 09/01/2017-normal . Abnormal pap smear yrs ago - [facility] . STD none. Menarche 14.   OB History:  Number of pregnancies 3. Pregnancy # 1 miscarriage / D & C. Pregnancy # 2 live birth, girl, vaginal delivery. Pregnancy # 3 live birth, vaginal delivery, girl.   Surgical History:  C6-7 fusion 2017     BTL 2004     ovarian cyst removal at age 65     diagnostic laporooscopy 1996  D & C     laparoscopy Myomectomy 1998?   Hospitalization:  childbirth x 2 vaginal     Neurological Disorder 08/2016   Family History:  Father: alive    Mother: alive, Hypertension, diagnosed with Hypertension    Paternal [place] Father: deceased    Paternal Grand Mother: deceased, Hypertension, stroke (8,70), Hypertension    Maternal Grand Father: deceased, bone cancer    Maternal Grand Mother: deceased, Hypertension    Sister 1: alive    Sister 2: alive    Sister 3: alive    58 sister(s) . 2daughter(s) .    denies any GYN family cancer hx.  Social History: General no Alcohol, none.  Children: Shane Crutch- 15.  no Caffeine.  Seat belt use: yes.  Tobacco use cigarettes: Never smoked, Tobacco history last updated  12/05/2017.  Religion: Non Denominational, Christian.  Marital Status: married, [name].  Recreational drug use: no.  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  Exercise: nothing structured.   ROS: CONSTITUTIONAL No" options="no,yes" propid="91" itemid="193425" categoryid="10464" encounterid="[id number]"Chills No. yes" options="no,yes" propid="91" itemid="172899" categoryid="10464" encounterid="[id number]"Fatigue yes. No" options="no,yes" propid="91" itemid="10467" categoryid="10464" encounterid="[id number]"Fever No. No" options="no,yes" propid="91" itemid="193426" categoryid="10464" encounterid="[id number]"Night sweats No. No" options="no,yes" propid="91" itemid="444261" categoryid="10464" encounterid="[id number]"Recent travel outside [name] No. No" options="no,yes" propid="91" itemid="193427" categoryid="10464" encounterid="[id number]"Sweats No. No" options="no,yes" propid="91" itemid="194825" categoryid="10464" encounterid="[id number]"Weight change No.  OPHTHALMOLOGY no" options="no,yes" propid="91" itemid="12520" categoryid="12516" encounterid="[id number]"Blurring of vision no. no" options="no,yes" propid="91" itemid="193469" categoryid="12516" encounterid="[id number]"Change in vision no. no" options="no,yes" propid="91" itemid="194379" categoryid="12516" encounterid="[id number]"Double vision no.  ENT no" options="no,yes" propid="91" itemid="193612" categoryid="10481" encounterid="[id number]"Dizziness no. Nose bleeds no. Sore throat no. Teeth pain no.  ALLERGY no" options="no,yes" propid="91" itemid="202589" categoryid="138152" encounterid="[id number]"Hives no.  CARDIOLOGY no" options="no,yes" propid="91" itemid="193603" categoryid="10488" encounterid="[id number]"Chest pain no. no" options="no,yes" propid="91" itemid="199089" categoryid="10488" encounterid="[id number]"High blood pressure no. no" options="no,yes" propid="91" itemid="202598" categoryid="10488" encounterid="[id number]"Irregular heart beat no.  no" options="no,yes" propid="91" itemid="10491" categoryid="10488" encounterid="[id number]"Leg edema no. no" options="no,yes" propid="91" itemid="10490" categoryid="10488" encounterid="[id number]"Palpitations no.  RESPIRATORY no" options="no" propid="91" itemid="270013" categoryid="138132" encounterid="[id number]"Shortness of breath no. no" options="no,yes" propid="91" itemid="172745" categoryid="138132" encounterid="[id number]"Cough no. no" options="no,yes" propid="91" itemid="193621" categoryid="138132" encounterid="[id number]"Wheezing no.  UROLOGY no" options="no,yes" propid="91" [phone]" categoryid="138166" encounterid="[id number]"Pain with urination no. no" options="no,yes" propid="91" itemid="193493" categoryid="138166" encounterid="[id number]"Urinary urgency no. no" options="no,yes" propid="91" itemid="193492" categoryid="138166" encounterid="[id number]"Urinary frequency no. no" options="no,yes" propid="91" itemid="138171" categoryid="138166" encounterid="[id number]"Urinary incontinence no. No" options="no,yes" propid="91" itemid="138167" categoryid="138166" encounterid="[id number]"Difficulty urinating No. No" options="no,yes" propid="91" itemid="138168" categoryid="138166" encounterid="[id number]"Blood in urine No.  GASTROENTEROLOGY no" options="no,yes" propid="91" itemid="10496" categoryid="10494" encounterid="[id number]"Abdominal pain no. no" options="no,yes" propid="91" itemid="193447" categoryid="10494" encounterid="[id number]"Appetite change no. no" options="no,yes" propid="91" itemid="193448" categoryid="10494" encounterid="[id number]"Bloating/belching no. no" options="no,yes" propid="91" itemid="10503" categoryid="10494" encounterid="[id number]"Blood in stool or on toilet paper no. no" options="no,yes" propid="91" itemid="199106" categoryid="10494" encounterid="[id number]"Change in bowel movements no. no" options="no,yes" propid="91" itemid="10501" categoryid="10494" encounterid="[id number]"Constipation no. no"  options="no,yes" propid="91" itemid="10502" categoryid="10494" encounterid="[id number]"Diarrhea no. no" options="no,yes" propid="91" itemid="199104" categoryid="10494" encounterid="[id number]"Difficulty swallowing no. no" options="no,yes" propid="91" itemid="10499" categoryid="10494" encounterid="[id number]"Nausea no.  FEMALE REPRODUCTIVE no" options="no,yes" propid="91" itemid="453725" categoryid="10525" encounterid="[id number]"Vulvar pain no. no" options="no,yes" propid="91" itemid="453726" categoryid="10525" encounterid="[id number]"Vulvar rash no. heavy menses" options="no, yes" propid="91" itemid="444315" categoryid="10525" encounterid="[id number]"Abnormal vaginal bleeding heavy menses. no" options="no,yes" propid="91" itemid="186083" categoryid="10525" encounterid="[id number]"Breast pain no. no" options="no,yes" propid="91" itemid="186084" categoryid="10525" encounterid="[id number]"Nipple discharge no. no" options="no,yes" propid="91" itemid="275823" categoryid="10525" encounterid="[id number]"Pain with intercourse no. no" options="no,yes" propid="91" itemid="186082" categoryid="10525" encounterid="[id number]"Pelvic pain no. no" options="no,yes" propid="91" itemid="278230" categoryid="10525" encounterid="[id number]"Unusual vaginal discharge no. no" options="no,yes" propid="91" itemid="278942" categoryid="10525" encounterid="[id number]"Vaginal itching no.  MUSCULOSKELETAL no" options="no,yes" propid="91" itemid="193461" categoryid="10514" encounterid="[id number]"Muscle aches no.  NEUROLOGY no" options="no,yes" propid="91" itemid="12513" categoryid="12512" encounterid="[id number]"Headache no. no" options="no,yes" propid="91" itemid="12514" categoryid="12512" encounterid="[id number]"Tingling/numbness no. no" options="no,yes" propid="91" itemid="193468" categoryid="12512" encounterid="[id number]"Weakness no.  PSYCHOLOGY  no" options="" propid="91" itemid="275919" categoryid="10520" encounterid="[id number]"Depression no. no" options="no,yes"  propid="91" itemid="172748" categoryid="10520" encounterid="[id number]"Anxiety no. no" options="no,yes" propid="91" itemid="199158" categoryid="10520" encounterid="[id number]"Nervousness no. no" options="no,yes" propid="91" itemid="12502" categoryid="10520" encounterid="[id number]"Sleep disturbances no. no " options="no,yes" propid="91" itemid="72718" categoryid="10520" encounterid="[id number]"Suicidal ideation no .  ENDOCRINOLOGY no" options="no,yes" propid="91" itemid="194628" categoryid="12508" encounterid="[id number]"Excessive thirst no. no" options="no,yes" propid="91" itemid="196285" categoryid="12508" encounterid="[id number]"Excessive urination no. no" options="no, yes" propid="91" itemid="444314" categoryid="12508" encounterid="[id number]"Hair loss no. no" options="" propid="91" itemid="447284" categoryid="12508" encounterid="[id number]"Heat or cold intolerance no.  HEMATOLOGY/LYMPH no" options="no,yes" propid="91" itemid="199152" categoryid="138157" encounterid="10635938"Abnormal bleeding no. no" options="no,yes" propid="91" itemid="170653" categoryid="138157" encounterid="[id number]"Easy bruising no. no" options="no,yes" propid="91" itemid="138158" categoryid="138157" encounterid="10635938"Swollen glands no.  DERMATOLOGY no" options="no,yes" propid="91" itemid="199126" categoryid="12503" encounterid="[id number]"New/changing skin lesion no. no" options="no,yes" propid="91" itemid="12504" categoryid="12503" encounterid="[id number]"Rash no. no" options="" propid="91" itemid="444313" categoryid="12503" encounterid="[id number]"Sores no.     Objective: Vitals: Wt 184, Wt change 7 lb, Ht 62, BMI 33.65, Pulse sitting 91, BP sitting 120/80  Past Results: Examination:  General Examination alert, oriented, NAD " categoryPropId="10089" examid="193638"CONSTITUTIONAL: alert, oriented, NAD .  moist, warm" categoryPropId="10109" examid="193638"SKIN: moist, warm.  Conjunctiva clear" categoryPropId="21468" examid="193638"EYES:  Conjunctiva clear.  clear to auscultation bilaterally" categoryPropId="87" examid="193638"LUNGS: clear to auscultation bilaterally.  regular rate and rhythm" categoryPropId="86" examid="193638"HEART: regular rate and rhythm.  soft, non-tender/non-distended, bowel sounds present " categoryPropId="88" examid="193638"ABDOMEN: soft, non-tender/non-distended, bowel sounds present .  normal external genitalia, labia - unremarkable, vagina - pink moist mucosa, no lesions or abnormal discharge, cervix - no discharge or lesions or CMT, adnexa - no masses or tenderness, uterus - nontender and normal size on palpation " categoryPropId="13414" examid="193638"FEMALE GENITOURINARY: normal external genitalia, labia - unremarkable, vagina - pink moist mucosa, no lesions or abnormal discharge, cervix - no discharge or lesions or CMT, adnexa - no masses or tenderness, uterus - nontender and normal size on palpation .  affect normal, good eye contact" categoryPropId="16316" examid="193638"PSYCH: affect normal, good eye contact.  Physical Examination:    Assessment: Assessment:  Menorrhagia with regular cycle - N92.0 (Primary)     Fibroids - D21.9     H/O: myomectomy - Z98.890     Plan: Treatment: Menorrhagia with regular cycle Notes: patient desires definitive therapy via a hysterectomy.. planning robotic assisted laparoscopic hysterectomy with bilateral salpingectomy. . I discussed with the pt r/b/a of hysterectomy including but not limited to infection/ bleeding damage to bowel bladder ureters and surrounding organs with the need for further surgery. r/o conversion for laparoscopic hysterectomy to total abdominal hysterectomy discussed. ... r/o transfustion hiv/ hep B&C discussed.. pt voiced understanding and desires to proceed. . Fibroids Notes: patient desires definitive therapy via a hysterectomy.. planning robotic assisted laparoscopic hysterectomy with bilateral salpingectomy. . I discussed with the pt  r/b/a of hysterectomy including but not limited to infection/ bleeding damage to bowel bladder ureters and surrounding organs with the need for further surgery. r/o conversion for laparoscopic hysterectomy to total abdominal hysterectomy discussed. ... r/o transfustion hiv/ hep B&C discussed.. pt voiced understanding and desires to proceed. . H/O: myomectomy Notes: patient desires definitive therapy via a hysterectomy.. planning robotic assisted laparoscopic hysterectomy with bilateral salpingectomy. . I discussed with the pt r/b/a of hysterectomy including but not limited to infection/ bleeding damage to bowel bladder ureters and surrounding organs with the need for further surgery. r/o conversion for laparoscopic hysterectomy to total abdominal hysterectomy discussed. ... r/o transfustion hiv/ hep B&C discussed.. pt voiced understanding and desires to proceed. Marland Kitchen

## 2017-12-05 NOTE — Patient Instructions (Addendum)
Amy Barry             DOB [birth year]    Your procedure is scheduled on:   12-13-17                 Report to [facility] Main  Entrance,  Report to Admitting at 10:00 AM    Call this number if you have problems the morning of surgery [phone]                 Remember: Do not eat food or drink liquids :After Midnight.     Take these medicines the morning of surgery with A SIP OF WATER: Metoprolol (Lopressor), Protonix                                            You may not have any metal on your body including hair pins and              piercings               Do not wear jewelry, make-up, lotions, powders or perfumes, deodorant             Do not wear nail polish.               Do not shave  48 hours prior to surgery.                Do not bring valuables to the hospital. [place].  Contacts, dentures or bridgework may not be worn into surgery.  Leave suitcase in the car. After surgery it may be brought to your room.     Patients discharged the day of surgery will not be allowed to drive home.  Name and phone number of your driver:  Special Instructions:  Pt Guidelines for [facility] [place] with instructions                                                [place] IN PRESCRIPTION BOTTLES              Please read over the following fact sheets you were given: _____________________________________________________________________             [facility] - Preparing for Surgery Before surgery, you can play an important role.  Because skin is not sterile, your skin needs to be as free of germs as possible.  You can reduce the number of germs on your skin by washing with CHG (chlorahexidine gluconate) soap before surgery.  CHG is an antiseptic cleaner which kills germs and bonds with the skin to continue killing germs even after  washing. Please DO NOT use if you have an allergy to CHG or antibacterial soaps.  If your skin becomes reddened/irritated stop using the CHG and inform your nurse when you arrive at Short Stay. Do not shave (including legs and underarms) for at least 48 hours prior to the first CHG shower.  You may shave your  face/neck. Please follow these instructions carefully:  1.  Shower with CHG Soap the night before surgery and the  morning of Surgery.  2.  If you choose to wash your hair, wash your hair first as usual with your  normal  shampoo.  3.  After you shampoo, rinse your hair and body thoroughly to remove the  shampoo.                                        4.  Use CHG as you would any other liquid soap.  You can apply chg directly  to the skin and wash                       Gently with a scrungie or clean washcloth.  5.  Apply the CHG Soap to your body ONLY FROM THE NECK DOWN.   Do not use on face/ open                           Wound or open sores. Avoid contact with eyes, ears mouth and genitals (private parts).                       Wash face,  Genitals (private parts) with your normal soap.             6.  Wash thoroughly, paying special attention to the area where your surgery  will be performed.  7.  Thoroughly rinse your body with warm water from the neck down.  8.  DO NOT shower/wash with your normal soap after using and rinsing off  the CHG Soap.             9.  Pat yourself dry with a clean towel.            10.  Wear clean pajamas.            11.  Place clean sheets on your bed the night of your first shower and do not  sleep with pets. Day of Surgery : Do not apply any lotions/deodorants the morning of surgery.  Please wear clean clothes to the hospital/surgery center.  FAILURE TO FOLLOW THESE INSTRUCTIONS MAY RESULT IN THE CANCELLATION OF YOUR SURGERY PATIENT SIGNATURE_________________________________  NURSE  SIGNATURE__________________________________  ________________________________________________________________________ WHAT IS A BLOOD TRANSFUSION? Blood Transfusion Information  A transfusion is the replacement of blood or some of its parts. Blood is made up of multiple cells which provide different functions.  Red blood cells carry oxygen and are used for blood loss replacement.  White blood cells fight against infection.  Platelets control bleeding.  Plasma helps clot blood.  Other blood products are available for specialized needs, such as hemophilia or other clotting disorders. BEFORE THE TRANSFUSION  Who gives blood for transfusions?   Healthy volunteers who are fully evaluated to make sure their blood is safe. This is blood bank blood. Transfusion therapy is the safest it has ever been in the practice of medicine. Before blood is taken from a donor, a complete history is taken to make sure that person has no history of diseases nor engages in risky social behavior (examples are intravenous drug use or sexual activity with multiple partners). The donor's travel history is screened to minimize risk of transmitting infections, such as malaria. The donated blood  is tested for signs of infectious diseases, such as HIV and hepatitis. The blood is then tested to be sure it is compatible with you in order to minimize the chance of a transfusion reaction. If you or a relative donates blood, this is often done in anticipation of surgery and is not appropriate for emergency situations. It takes many days to process the donated blood. RISKS AND COMPLICATIONS Although transfusion therapy is very safe and saves many lives, the main dangers of transfusion include:   Getting an infectious disease.  Developing a transfusion reaction. This is an allergic reaction to something in the blood you were given. Every precaution is taken to prevent this. The decision to have a blood transfusion has been  considered carefully by your caregiver before blood is given. Blood is not given unless the benefits outweigh the risks. AFTER THE TRANSFUSION  Right after receiving a blood transfusion, you will usually feel much better and more energetic. This is especially true if your red blood cells have gotten low (anemic). The transfusion raises the level of the red blood cells which carry oxygen, and this usually causes an energy increase.  The nurse administering the transfusion will monitor you carefully for complications. HOME CARE INSTRUCTIONS  No special instructions are needed after a transfusion. You may find your energy is better. Speak with your caregiver about any limitations on activity for underlying diseases you may have. SEEK MEDICAL CARE IF:   Your condition is not improving after your transfusion.  You develop redness or irritation at the intravenous (IV) site. SEEK IMMEDIATE MEDICAL CARE IF:  Any of the following symptoms occur over the next 12 hours:  Shaking chills.  You have a temperature by mouth above 102 F (38.9 C), not controlled by medicine.  Chest, back, or muscle pain.  People around you feel you are not acting correctly or are confused.  Shortness of breath or difficulty breathing.  Dizziness and fainting.  You get a rash or develop hives.  You have a decrease in urine output.  Your urine turns a dark color or changes to pink, red, or brown. Any of the following symptoms occur over the next 10 days:  You have a temperature by mouth above 102 F (38.9 C), not controlled by medicine.  Shortness of breath.  Weakness after normal activity.  The white part of the eye turns yellow (jaundice).  You have a decrease in the amount of urine or are urinating less often.  Your urine turns a dark color or changes to pink, red, or brown. Document Released: 03/18/2000 Document Revised: 06/13/2011 Document Reviewed: 11/05/2007 [facility] Patient Information 2014  [place], [place].  _______________________________________________________________________

## 2017-12-06 ENCOUNTER — Encounter (HOSPITAL_COMMUNITY)
Admission: RE | Admit: 2017-12-06 | Discharge: 2017-12-06 | Disposition: A | Discharge status: Home / Self Care | Payer: 59 | Source: Ambulatory Visit | Attending: Obstetrics and Gynecology | Admitting: Obstetrics and Gynecology

## 2017-12-06 ENCOUNTER — Encounter (HOSPITAL_COMMUNITY): Payer: Self-pay

## 2017-12-06 ENCOUNTER — Other Ambulatory Visit: Payer: Self-pay

## 2017-12-06 DIAGNOSIS — Z01810 Encounter for preprocedural cardiovascular examination: Secondary | ICD-10-CM | POA: Insufficient documentation

## 2017-12-06 DIAGNOSIS — N938 Other specified abnormal uterine and vaginal bleeding: Secondary | ICD-10-CM | POA: Insufficient documentation

## 2017-12-06 DIAGNOSIS — Z01812 Encounter for preprocedural laboratory examination: Secondary | ICD-10-CM | POA: Insufficient documentation

## 2017-12-06 DIAGNOSIS — D259 Leiomyoma of uterus, unspecified: Secondary | ICD-10-CM | POA: Insufficient documentation

## 2017-12-06 HISTORY — DX: Iron deficiency anemia, unspecified: D50.9

## 2017-12-06 HISTORY — DX: Systemic lupus erythematosus, unspecified: M32.9

## 2017-12-06 HISTORY — DX: Pain in unspecified joint: M25.50

## 2017-12-06 HISTORY — DX: Weakness: R53.1

## 2017-12-06 HISTORY — DX: Presence of spectacles and contact lenses: Z97.3

## 2017-12-06 LAB — COMPREHENSIVE METABOLIC PANEL
ALBUMIN: 3.9 g/dL (ref 3.5–5.0)
ALT: 14 U/L (ref 0–44)
ANION GAP: 8 (ref 5–15)
AST: 15 U/L (ref 15–41)
Alkaline Phosphatase: 59 U/L (ref 38–126)
BILIRUBIN TOTAL: 0.4 mg/dL (ref 0.3–1.2)
BUN: 12 mg/dL (ref 6–20)
CO2: 29 mmol/L (ref 22–32)
CREATININE: 0.67 mg/dL (ref 0.44–1.00)
Calcium: 9.2 mg/dL (ref 8.9–10.3)
Chloride: 107 mmol/L (ref 98–111)
GFR calc Af Amer: >60 mL/min (ref >60)
GFR calc non Af Amer: >60 mL/min (ref >60)
GLUCOSE: 97 mg/dL (ref 70–99)
Potassium: 3.5 mmol/L (ref 3.5–5.1)
SODIUM: 144 mmol/L (ref 135–145)
TOTAL PROTEIN: 7.1 g/dL (ref 6.5–8.1)

## 2017-12-06 LAB — CBC
HEMATOCRIT: 39.1 % (ref 36.0–46.0)
HEMOGLOBIN: 12.9 g/dL (ref 12.0–15.0)
MCH: 31.2 pg (ref 26.0–34.0)
MCHC: 33 g/dL (ref 30.0–36.0)
MCV: 94.7 fL (ref 78.0–100.0)
Platelets: 260 10*3/uL (ref 150–400)
RBC: 4.13 MIL/uL (ref 3.87–5.11)
RDW: 15.8 % — ABNORMAL HIGH (ref 11.5–15.5)
WBC: 7.7 10*3/uL (ref 4.0–10.5)

## 2017-12-07 LAB — ABO/RH: ABO/RH(D): A POS

## 2017-12-07 NOTE — Progress Notes (Signed)
Final EKG dated 12-06-2017 in epic.

## 2017-12-13 ENCOUNTER — Ambulatory Visit (HOSPITAL_COMMUNITY): Payer: 59 | Admitting: Anesthesiology

## 2017-12-13 ENCOUNTER — Encounter (HOSPITAL_COMMUNITY): Payer: Self-pay | Admitting: Anesthesiology

## 2017-12-13 ENCOUNTER — Observation Stay (HOSPITAL_COMMUNITY)
Admission: RE | Admit: 2017-12-13 | Discharge: 2017-12-14 | Disposition: A | Discharge status: Home / Self Care | Payer: 59 | Source: Ambulatory Visit | Attending: Obstetrics and Gynecology | Admitting: Obstetrics and Gynecology

## 2017-12-13 ENCOUNTER — Encounter (HOSPITAL_COMMUNITY)
Admission: RE | Disposition: A | Discharge status: Home / Self Care | Payer: Self-pay | Source: Ambulatory Visit | Attending: Obstetrics and Gynecology

## 2017-12-13 DIAGNOSIS — M329 Systemic lupus erythematosus, unspecified: Secondary | ICD-10-CM | POA: Insufficient documentation

## 2017-12-13 DIAGNOSIS — N838 Other noninflammatory disorders of ovary, fallopian tube and broad ligament: Secondary | ICD-10-CM | POA: Diagnosis not present

## 2017-12-13 DIAGNOSIS — N888 Other specified noninflammatory disorders of cervix uteri: Secondary | ICD-10-CM | POA: Insufficient documentation

## 2017-12-13 DIAGNOSIS — Z79899 Other long term (current) drug therapy: Secondary | ICD-10-CM | POA: Diagnosis not present

## 2017-12-13 DIAGNOSIS — K219 Gastro-esophageal reflux disease without esophagitis: Secondary | ICD-10-CM | POA: Insufficient documentation

## 2017-12-13 DIAGNOSIS — Z9851 Tubal ligation status: Secondary | ICD-10-CM | POA: Insufficient documentation

## 2017-12-13 DIAGNOSIS — I10 Essential (primary) hypertension: Secondary | ICD-10-CM | POA: Diagnosis not present

## 2017-12-13 DIAGNOSIS — Z791 Long term (current) use of non-steroidal anti-inflammatories (NSAID): Secondary | ICD-10-CM | POA: Diagnosis not present

## 2017-12-13 DIAGNOSIS — D251 Intramural leiomyoma of uterus: Secondary | ICD-10-CM | POA: Diagnosis present

## 2017-12-13 DIAGNOSIS — Z90710 Acquired absence of both cervix and uterus: Secondary | ICD-10-CM | POA: Diagnosis present

## 2017-12-13 DIAGNOSIS — N921 Excessive and frequent menstruation with irregular cycle: Secondary | ICD-10-CM | POA: Diagnosis not present

## 2017-12-13 HISTORY — PX: ROBOTIC ASSISTED TOTAL HYSTERECTOMY: SHX6085

## 2017-12-13 LAB — TYPE AND SCREEN
ABO/RH(D): A POS
Antibody Screen: NEGATIVE

## 2017-12-13 LAB — PREGNANCY, URINE: PREG TEST UR: NEGATIVE

## 2017-12-13 SURGERY — HYSTERECTOMY, TOTAL, ROBOT-ASSISTED
Anesthesia: General

## 2017-12-13 MED ORDER — FENTANYL CITRATE (PF) 250 MCG/5ML IJ SOLN
INTRAMUSCULAR | Status: AC
  Filled 2017-12-13: qty 5

## 2017-12-13 MED ORDER — ROCURONIUM BROMIDE 10 MG/ML (PF) SYRINGE
PREFILLED_SYRINGE | INTRAVENOUS | Status: AC
  Filled 2017-12-13: qty 10

## 2017-12-13 MED ORDER — SUGAMMADEX SODIUM 200 MG/2ML IV SOLN
INTRAVENOUS | Status: AC
  Filled 2017-12-13: qty 2

## 2017-12-13 MED ORDER — KETOROLAC TROMETHAMINE 30 MG/ML IJ SOLN
INTRAMUSCULAR | Status: AC
  Filled 2017-12-13: qty 1

## 2017-12-13 MED ORDER — KETOROLAC TROMETHAMINE 30 MG/ML IJ SOLN
30.0000 mg | Freq: Four times a day (QID) | INTRAMUSCULAR | Status: DC
  Administered 2017-12-13 – 2017-12-14 (×4): 30 mg via INTRAVENOUS

## 2017-12-13 MED ORDER — SCOPOLAMINE 1 MG/3DAYS TD PT72
MEDICATED_PATCH | TRANSDERMAL | Status: AC
  Administered 2017-12-13: 1.5 mg
  Filled 2017-12-13: qty 1

## 2017-12-13 MED ORDER — HYDROMORPHONE HCL 1 MG/ML IJ SOLN
INTRAMUSCULAR | Status: AC
  Filled 2017-12-13: qty 1

## 2017-12-13 MED ORDER — FENTANYL CITRATE (PF) 100 MCG/2ML IJ SOLN
INTRAMUSCULAR | Status: AC
  Filled 2017-12-13: qty 4

## 2017-12-13 MED ORDER — HYDROMORPHONE HCL 1 MG/ML IJ SOLN
0.5000 mg | Freq: Once | INTRAMUSCULAR | Status: AC
  Administered 2017-12-13: 0.5 mg via INTRAVENOUS

## 2017-12-13 MED ORDER — SENNA 8.6 MG PO TABS
1.0000 | ORAL_TABLET | Freq: Two times a day (BID) | ORAL | Status: DC
  Administered 2017-12-13: 8.6 mg via ORAL
  Filled 2017-12-13 (×2): qty 1

## 2017-12-13 MED ORDER — SODIUM CHLORIDE 0.9 % IV SOLN
INTRAVENOUS | Status: DC | PRN
  Administered 2017-12-13: 120 mL

## 2017-12-13 MED ORDER — ALUM & MAG HYDROXIDE-SIMETH 200-200-20 MG/5ML PO SUSP: 30.0000 mL | ORAL | Status: DC | PRN

## 2017-12-13 MED ORDER — MIDAZOLAM HCL 2 MG/2ML IJ SOLN
INTRAMUSCULAR | Status: AC
  Filled 2017-12-13: qty 2

## 2017-12-13 MED ORDER — ONDANSETRON HCL 4 MG/2ML IJ SOLN
INTRAMUSCULAR | Status: AC
  Filled 2017-12-13: qty 2

## 2017-12-13 MED ORDER — HYDROMORPHONE HCL 1 MG/ML IJ SOLN
0.2000 mg | INTRAMUSCULAR | Status: DC | PRN
  Administered 2017-12-13 (×2): 0.25 mg via INTRAVENOUS

## 2017-12-13 MED ORDER — DEXAMETHASONE SODIUM PHOSPHATE 10 MG/ML IJ SOLN
INTRAMUSCULAR | Status: AC
  Filled 2017-12-13: qty 1

## 2017-12-13 MED ORDER — KETAMINE HCL 10 MG/ML IJ SOLN
INTRAMUSCULAR | Status: AC
  Filled 2017-12-13: qty 1

## 2017-12-13 MED ORDER — LIDOCAINE 2% (20 MG/ML) 5 ML SYRINGE
INTRAMUSCULAR | Status: DC | PRN
  Administered 2017-12-13: 1.5 mg/kg/h via INTRAVENOUS

## 2017-12-13 MED ORDER — ROCURONIUM BROMIDE 100 MG/10ML IV SOLN
INTRAVENOUS | Status: DC | PRN
  Administered 2017-12-13 (×2): 10 mg via INTRAVENOUS
  Administered 2017-12-13: 50 mg via INTRAVENOUS

## 2017-12-13 MED ORDER — HYDROXYCHLOROQUINE SULFATE 200 MG PO TABS
200.0000 mg | ORAL_TABLET | Freq: Every day | ORAL | Status: DC
  Administered 2017-12-13: 200 mg via ORAL

## 2017-12-13 MED ORDER — KETOROLAC TROMETHAMINE 30 MG/ML IJ SOLN: 30.0000 mg | Freq: Four times a day (QID) | INTRAMUSCULAR | Status: DC

## 2017-12-13 MED ORDER — IBUPROFEN 200 MG PO TABS: 800.0000 mg | ORAL_TABLET | Freq: Three times a day (TID) | ORAL | Status: DC | PRN

## 2017-12-13 MED ORDER — ONDANSETRON HCL 4 MG/2ML IJ SOLN
INTRAMUSCULAR | Status: DC | PRN
  Administered 2017-12-13: 8 mg via INTRAVENOUS

## 2017-12-13 MED ORDER — ONDANSETRON HCL 4 MG/2ML IJ SOLN
4.0000 mg | Freq: Four times a day (QID) | INTRAMUSCULAR | Status: DC | PRN
  Administered 2017-12-13 – 2017-12-14 (×2): 4 mg via INTRAVENOUS

## 2017-12-13 MED ORDER — DEXAMETHASONE SODIUM PHOSPHATE 4 MG/ML IJ SOLN
INTRAMUSCULAR | Status: DC | PRN
  Administered 2017-12-13: 10 mg via INTRAVENOUS

## 2017-12-13 MED ORDER — SODIUM CHLORIDE 0.9 % IV SOLN
2.0000 g | INTRAVENOUS | Status: AC
  Administered 2017-12-13: 2 g via INTRAVENOUS
  Filled 2017-12-13: qty 2

## 2017-12-13 MED ORDER — ONDANSETRON HCL 4 MG/2ML IJ SOLN: 4.0000 mg | Freq: Once | INTRAMUSCULAR | Status: DC | PRN

## 2017-12-13 MED ORDER — ONDANSETRON HCL 4 MG PO TABS: 4.0000 mg | ORAL_TABLET | Freq: Four times a day (QID) | ORAL | Status: DC | PRN

## 2017-12-13 MED ORDER — KETOROLAC TROMETHAMINE 30 MG/ML IJ SOLN
INTRAMUSCULAR | Status: DC | PRN
  Administered 2017-12-13: 30 mg via INTRAVENOUS

## 2017-12-13 MED ORDER — HYDROMORPHONE HCL 1 MG/ML IJ SOLN
1.0000 mg | INTRAMUSCULAR | Status: DC | PRN
  Administered 2017-12-13 – 2017-12-14 (×2): 1 mg via INTRAVENOUS

## 2017-12-13 MED ORDER — KETAMINE HCL 10 MG/ML IJ SOLN
INTRAMUSCULAR | Status: DC | PRN
  Administered 2017-12-13: 25 mg via INTRAVENOUS

## 2017-12-13 MED ORDER — PANTOPRAZOLE SODIUM 40 MG PO TBEC: 40.0000 mg | DELAYED_RELEASE_TABLET | Freq: Every day | ORAL | Status: DC | PRN

## 2017-12-13 MED ORDER — LACTATED RINGERS IV SOLN
INTRAVENOUS | Status: DC
  Administered 2017-12-14: 07:00:00 via INTRAVENOUS

## 2017-12-13 MED ORDER — MENTHOL 3 MG MT LOZG: 1.0000 | LOZENGE | OROMUCOSAL | Status: DC | PRN

## 2017-12-13 MED ORDER — PROPOFOL 10 MG/ML IV BOLUS
INTRAVENOUS | Status: AC
  Filled 2017-12-13: qty 20

## 2017-12-13 MED ORDER — FENTANYL CITRATE (PF) 100 MCG/2ML IJ SOLN
INTRAMUSCULAR | Status: DC | PRN
  Administered 2017-12-13 (×3): 25 ug via INTRAVENOUS
  Administered 2017-12-13 (×2): 50 ug via INTRAVENOUS
  Administered 2017-12-13 (×7): 25 ug via INTRAVENOUS

## 2017-12-13 MED ORDER — FENTANYL CITRATE (PF) 100 MCG/2ML IJ SOLN
25.0000 ug | INTRAMUSCULAR | Status: DC | PRN
  Administered 2017-12-13 (×2): 50 ug via INTRAVENOUS

## 2017-12-13 MED ORDER — POTASSIUM CHLORIDE ER 10 MEQ PO TBCR
10.0000 meq | EXTENDED_RELEASE_TABLET | Freq: Every day | ORAL | Status: DC
  Administered 2017-12-13: 10 meq via ORAL

## 2017-12-13 MED ORDER — SIMETHICONE 80 MG PO CHEW: 80.0000 mg | CHEWABLE_TABLET | Freq: Four times a day (QID) | ORAL | Status: DC | PRN

## 2017-12-13 MED ORDER — METOPROLOL TARTRATE 50 MG PO TABS
100.0000 mg | ORAL_TABLET | Freq: Two times a day (BID) | ORAL | Status: DC
  Administered 2017-12-13: 100 mg via ORAL

## 2017-12-13 MED ORDER — LACTATED RINGERS IV SOLN
INTRAVENOUS | Status: DC
  Administered 2017-12-13: 11:00:00 via INTRAVENOUS

## 2017-12-13 MED ORDER — MIDAZOLAM HCL 5 MG/5ML IJ SOLN
INTRAMUSCULAR | Status: DC | PRN
  Administered 2017-12-13: 1 mg via INTRAVENOUS

## 2017-12-13 MED ORDER — PROPOFOL 10 MG/ML IV BOLUS
INTRAVENOUS | Status: DC | PRN
  Administered 2017-12-13: 150 mg via INTRAVENOUS

## 2017-12-13 MED ORDER — LIDOCAINE 2% (20 MG/ML) 5 ML SYRINGE
INTRAMUSCULAR | Status: AC
  Filled 2017-12-13: qty 5

## 2017-12-13 MED ORDER — LIDOCAINE HCL (CARDIAC) PF 100 MG/5ML IV SOSY
PREFILLED_SYRINGE | INTRAVENOUS | Status: DC | PRN
  Administered 2017-12-13: 60 mg via INTRAVENOUS

## 2017-12-13 MED ORDER — SUGAMMADEX SODIUM 200 MG/2ML IV SOLN
INTRAVENOUS | Status: DC | PRN
  Administered 2017-12-13: 200 mg via INTRAVENOUS

## 2017-12-13 MED ORDER — ARTIFICIAL TEARS OPHTHALMIC OINT
TOPICAL_OINTMENT | OPHTHALMIC | Status: AC
  Filled 2017-12-13: qty 3.5

## 2017-12-13 MED ORDER — FUROSEMIDE 40 MG PO TABS: 40.0000 mg | ORAL_TABLET | Freq: Every morning | ORAL | Status: DC

## 2017-12-13 MED ORDER — TEMAZEPAM 15 MG PO CAPS: 15.0000 mg | ORAL_CAPSULE | Freq: Every evening | ORAL | Status: DC | PRN

## 2017-12-13 MED ORDER — LIDOCAINE 2% (20 MG/ML) 5 ML SYRINGE
INTRAMUSCULAR | Status: AC
  Filled 2017-12-13: qty 10

## 2017-12-13 MED ORDER — SODIUM CHLORIDE 0.9 % IR SOLN
Status: DC | PRN
  Administered 2017-12-13: 1000 mL

## 2017-12-13 MED ORDER — FENTANYL CITRATE (PF) 100 MCG/2ML IJ SOLN
INTRAMUSCULAR | Status: AC
  Filled 2017-12-13: qty 2

## 2017-12-13 SURGICAL SUPPLY — 56 items
ADH SKN CLS APL DERMABOND .7 (GAUZE/BANDAGES/DRESSINGS) ×1
APPLICATOR ARISTA FLEXITIP XL (MISCELLANEOUS) IMPLANT
BARRIER ADHS 3X4 INTERCEED (GAUZE/BANDAGES/DRESSINGS) IMPLANT
BRR ADH 4X3 ABS CNTRL BYND (GAUZE/BANDAGES/DRESSINGS)
CATH FOLEY 3WAY  5CC 16FR (CATHETERS) ×1
CATH FOLEY 3WAY 5CC 16FR (CATHETERS) ×1 IMPLANT
CONT PATH 16OZ SNAP LID 3702 (MISCELLANEOUS) ×2 IMPLANT
COVER BACK TABLE 60X90IN (DRAPES) ×2 IMPLANT
COVER TIP SHEARS 8 DVNC (MISCELLANEOUS) ×1 IMPLANT
COVER TIP SHEARS 8MM DA VINCI (MISCELLANEOUS) ×1
DECANTER SPIKE VIAL GLASS SM (MISCELLANEOUS) IMPLANT
DEFOGGER SCOPE WARMER CLEARIFY (MISCELLANEOUS) ×2 IMPLANT
DERMABOND ADVANCED (GAUZE/BANDAGES/DRESSINGS) ×1
DERMABOND ADVANCED .7 DNX12 (GAUZE/BANDAGES/DRESSINGS) ×1 IMPLANT
DILATOR CANAL MILEX (MISCELLANEOUS) IMPLANT
DRAPE ARM DVNC X/XI (DISPOSABLE) ×4 IMPLANT
DRAPE COLUMN DVNC XI (DISPOSABLE) ×1 IMPLANT
DRAPE DA VINCI XI ARM (DISPOSABLE) ×4
DRAPE DA VINCI XI COLUMN (DISPOSABLE) ×1
DURAPREP 26ML APPLICATOR (WOUND CARE) ×2 IMPLANT
ELECT REM PT RETURN 15FT ADLT (MISCELLANEOUS) ×2 IMPLANT
GLOVE BIOGEL M 6.5 STRL (GLOVE) IMPLANT
GLOVE BIOGEL PI IND STRL 7.0 (GLOVE) ×5 IMPLANT
GLOVE BIOGEL PI INDICATOR 7.0 (GLOVE) ×5
HEMOSTAT ARISTA ABSORB 3G PWDR (MISCELLANEOUS) IMPLANT
IRRIG SUCT STRYKERFLOW 2 WTIP (MISCELLANEOUS) ×2
IRRIGATION SUCT STRKRFLW 2 WTP (MISCELLANEOUS) ×1 IMPLANT
LEGGING LITHOTOMY PAIR STRL (DRAPES) ×2 IMPLANT
OBTURATOR OPTICAL STANDARD 8MM (TROCAR)
OBTURATOR OPTICAL STND 8 DVNC (TROCAR)
OBTURATOR OPTICALSTD 8 DVNC (TROCAR) IMPLANT
OCCLUDER COLPOPNEUMO (BALLOONS) ×2 IMPLANT
PACK ROBOT WH (CUSTOM PROCEDURE TRAY) ×2 IMPLANT
PACK ROBOTIC GOWN (GOWN DISPOSABLE) ×2 IMPLANT
PACK TRENDGUARD 450 HYBRID PRO (MISCELLANEOUS) ×1 IMPLANT
PAD PREP 24X48 CUFFED NSTRL (MISCELLANEOUS) ×2 IMPLANT
POSITIONER SURGICAL ARM (MISCELLANEOUS) ×2 IMPLANT
SEAL CANN UNIV 5-8 DVNC XI (MISCELLANEOUS) ×4 IMPLANT
SEAL XI 5MM-8MM UNIVERSAL (MISCELLANEOUS) ×4
SEALER VESSEL DA VINCI XI (MISCELLANEOUS) ×1
SEALER VESSEL EXT DVNC XI (MISCELLANEOUS) ×1 IMPLANT
SET CYSTO W/LG BORE CLAMP LF (SET/KITS/TRAYS/PACK) IMPLANT
SET TRI-LUMEN FLTR TB AIRSEAL (TUBING) IMPLANT
SUT VIC AB 0 CT1 27 (SUTURE) ×2
SUT VIC AB 0 CT1 27XBRD ANBCTR (SUTURE) ×2 IMPLANT
SUT VICRYL 0 UR6 27IN ABS (SUTURE) ×2 IMPLANT
SUT VICRYL RAPIDE 4/0 PS 2 (SUTURE) ×6 IMPLANT
SUT VLOC 180 0 9IN  GS21 (SUTURE) ×1
SUT VLOC 180 0 9IN GS21 (SUTURE) ×1 IMPLANT
TIP UTERINE 6.7X6CM WHT DISP (MISCELLANEOUS) IMPLANT
TIP UTERINE 6.7X8CM BLUE DISP (MISCELLANEOUS) ×2 IMPLANT
TOWEL OR 17X26 10 PK STRL BLUE (TOWEL DISPOSABLE) ×4 IMPLANT
TRENDGUARD 450 HYBRID PRO PACK (MISCELLANEOUS) ×2
TROCAR BLADELESS OPT 5 100 (ENDOMECHANICALS) IMPLANT
TROCAR PORT AIRSEAL 8X120 (TROCAR) ×2 IMPLANT
WATER STERILE IRR 1000ML POUR (IV SOLUTION) ×2 IMPLANT

## 2017-12-13 NOTE — Anesthesia Preprocedure Evaluation (Signed)
Anesthesia Evaluation  Patient identified by MRN, date of birth, ID band Patient awake    Reviewed: Allergy & Precautions, NPO status , Patient's Chart, lab work & pertinent test results  Airway Mallampati: II  TM Distance: >3 FB Neck ROM: Full    Dental  (+) Dental Advisory Given   Pulmonary neg pulmonary ROS,    breath sounds clear to auscultation       Cardiovascular hypertension, Pt. on medications and Pt. on home beta blockers  Rhythm:Regular Rate:Normal     Neuro/Psych negative neurological ROS     GI/Hepatic Neg liver ROS, GERD  ,  Endo/Other  negative endocrine ROS  Renal/GU negative Renal ROS     Musculoskeletal  (+) Arthritis ,   Abdominal   Peds  Hematology  (+) anemia ,   Anesthesia Other Findings   Reproductive/Obstetrics                             Lab Results  Component Value Date   WBC 7.7 12/06/2017   HGB 12.9 12/06/2017   HCT 39.1 12/06/2017   MCV 94.7 12/06/2017   PLT 260 12/06/2017   Lab Results  Component Value Date   CREATININE 0.67 12/06/2017   BUN 12 12/06/2017   NA 144 12/06/2017   K 3.5 12/06/2017   CL 107 12/06/2017   CO2 29 12/06/2017    Anesthesia Physical Anesthesia Plan  ASA: II  Anesthesia Plan: General   Post-op Pain Management:    Induction: Intravenous  PONV Risk Score and Plan: 4 or greater and Scopolamine patch - Pre-op, Dexamethasone, Ondansetron and Treatment may vary due to age or medical condition  Airway Management Planned: Oral ETT  Additional Equipment:   Intra-op Plan:   Post-operative Plan: Extubation in OR  Informed Consent: I have reviewed the patients History and Physical, chart, labs and discussed the procedure including the risks, benefits and alternatives for the proposed anesthesia with the patient or authorized representative who has indicated his/her understanding and acceptance.   Dental advisory  given  Plan Discussed with: CRNA  Anesthesia Plan Comments:         Anesthesia Quick Evaluation

## 2017-12-13 NOTE — Progress Notes (Signed)
12/13/2017 6:38 PM Pt. Continues to c/o severe lower abdomen cramping despite enacting prescribed inerventions, VSS, incisions WNL, no bleeding noted on peripad. Dr. Rica Koyanagi contacted and made aware. Pt. Allergies reviewed. Verbal order received ok to repeat Dilaudid 0.5 mg IV x 1 to assist with pain management. Orders placed and enacted. Will continue to closely monitor patient.  Eleni Frank, Arville Lime

## 2017-12-13 NOTE — Anesthesia Postprocedure Evaluation (Signed)
Anesthesia Post Note  Patient: Amy Barry  Procedure(s) Performed: XI ROBOTIC ASSISTED TOTAL HYSTERECTOMYWITH BILATERAL SALPINGECTOMY (N/A )     Patient location during evaluation: PACU Anesthesia Type: General Level of consciousness: awake Pain management: pain level controlled Vital Signs Assessment: post-procedure vital signs reviewed and stable Respiratory status: spontaneous breathing Cardiovascular status: stable Postop Assessment: no apparent nausea or vomiting Anesthetic complications: no    Last Vitals:  Vitals:   12/13/17 1645 12/13/17 1730  BP: 126/73 133/76  Pulse: 68 76  Resp: 12 18  Temp: 36.4 C 36.4 C  SpO2: 95% 100%    Last Pain:  Vitals:   12/13/17 1730  TempSrc:   PainSc: 7                  Taylyn Brame

## 2017-12-13 NOTE — Op Note (Signed)
12/13/2017  2:36 PM  PATIENT:  Amy Barry  47 y.o. female  PRE-OPERATIVE DIAGNOSIS:  Menorrhagia with regular cycle H/O :myomectomyFibroids, intramural  POST-OPERATIVE DIAGNOSIS:  Menorrhagia with regular cycle H/O :myomectomyFibroids, intramural  PROCEDURE:  Procedure(s): XI ROBOTIC ASSISTED TOTAL HYSTERECTOMYWITH BILATERAL SALPINGECTOMY (N/A)  SURGEON:  Surgeon(s) and Role:    Christophe Louis, MD - Primary    * Janyth Pupa, DO - Assisting  PHYSICIAN ASSISTANT:   ASSISTANTS: Dr. Otis Peak assisted due to concern for adhesive disease    ANESTHESIA:   general  EBL:  50 mL   BLOOD ADMINISTERED:none  DRAINS: Urinary Catheter (Foley)   LOCAL MEDICATIONS USED:  OTHER ropivicaine  SPECIMEN:  Source of Specimen:  uterus cervix and bilateral fallopian tubes   DISPOSITION OF SPECIMEN:  PATHOLOGY  COUNTS:  YES  TOURNIQUET:  * No tourniquets in log *  DICTATION: .Dragon Dictation  PLAN OF CARE: Discharge to home after PACU  PATIENT DISPOSITION:  PACU - hemodynamically stable.   Delay start of Pharmacological VTE agent (>24hrs) due to surgical blood loss or risk of bleeding: not applicable   Procedure: The patient was taken to the operating room where she was placed under general anesthesia.Time out was performed. Marland Kitchen She was placed in dorsal lithotomy position and prepped and draped in the usual sterile fashion. A weighted speculum was placed into the vagina. A Deaver was placed anteriorly for retraction. The anterior lip of the cervix was grasped with a single-tooth tenaculum. The vaginal mucosa was injected with 2.5 cc of ropivacaine at the 2/4/ 8 and 10 o'clock positions. The uterus was sounded to 8 cm. the cervix was dilated to 6 mm . 0 vicryl suture placed at the 12 and 6:00 positions Of the cervix to facilitate placement of a Ru mi uterine manipulator. The manipulator was placed without difficulty. Weighted speculum and Deaver were removed .  Attention was turned to the  patient's abdomen where a 8 mm trocar was placed 2 cm above the umbilicus. under direct visualization . The pneumoperitoneum was achieved with PCO2 gas. The laparoscope was removed. 60 cc of ropivacaine were injected into the abdominal cavity. The laparoscope was reinserted. An 8 mm trocar was placed in the right upper quadrant 16 centimeters from the umbilicus.later connected to robotic arm #4). An 8MM incision was made in the Right upper quadrant TROCAR WAS PLACED 8 cm from the umbilicus. Later connected to robotic arm #3. An 8 mm incision was made in the left upper quadrant 16 cm from the umbilicus and connected to robot arm #1. Marland Kitchen Attention was turned to the left upper quadrant where a 8 mm midclavicular assistant trocar was placed. ( All incision sites were injected with 10cc of ropivacaine prior to port placement. )  Once all ports had been placed under direct visualization.The laparoscope was removed and the [place] robotic system was thin right-sided docked. The robotic arms were connected to the corresponding trocars as listed above. The laparoscope was then reinserted. The long tip bipolar forceps were placed into port #1. The monopolar scissor placed in the port #4. A vessel sealerwas placed in port #3. All instruments were directed into the pelvis under direct visualization.  Attention was turned to the surgeons console.. The left mesosalpinx and left utero-ovarian ligament was cauterized and transected with the vessel sealer The broad ligament was cauterized and transected with the vessel sealer .The round ligament was cauterized and transected with the vessel sealer  The anterior leaf of broad  ligament was incised along the bladder reflection to the midline.  The right  mesosalpinx and right utero-ovarian ligament was cauterized and transected with the vessel sealer. The right broad ligament was cauterized and transected with the vessel sealer. The right round ligament was cauterized and transected  with the vessel sealer The broad ligament was incised to the midline. The bladder was dissected off the lower uterine segments of the cervix via sharp and blunt dissection.   The uterine arteries were skeleton bilaterally. They were  cauterized and transected with the vessel sealer The KOH ring was identified. The anterior colpotomy was performed followed by the posterior colpotomy. Once the uterus,cervix and bilateral fallopian tubes were completely excised was removed through the vagina. The  bipolar forceps and scissors were removed and log tip forceps were placed in the port #1 and the cutting needle driver was placed in to port #3.  The vaginal cuff was closed with running suture if 0 v-lock. The pelvis was irrigated. Marland KitchenMarland KitchenMarland KitchenExcellent hemostasis was noted. Arista was placed along the vaginal cuff.  All pelvic pedicles were examined and hemostasis was noted.  All instruments removed from the ports. All ports were removed under direct Visualization. The pneumoperitoneum was released. The skin incisions were closed with 4-0 Vicryl and then covered with Derma bond.     Sponge lap and needle counts weIre correct x. The patient was awakened from anesthesia and taken to the recovery room in stable condition.

## 2017-12-13 NOTE — Transfer of Care (Signed)
Immediate Anesthesia Transfer of Care Note  Patient: Amy Barry  Procedure(s) Performed: Procedure(s) (LRB): XI ROBOTIC ASSISTED TOTAL HYSTERECTOMYWITH BILATERAL SALPINGECTOMY (N/A)  Patient Location: PACU  Anesthesia Type: General  Level of Consciousness: awake, sedated, patient cooperative and responds to stimulation  Airway & Oxygen Therapy: Patient Spontanous Breathing and Patient connected to face mask oxygen  Post-op Assessment: Report given to PACU RN, Post -op Vital signs reviewed and stable and Patient moving all extremities  Post vital signs: Reviewed and stable  Complications: No apparent anesthesia complications

## 2017-12-13 NOTE — Anesthesia Procedure Notes (Signed)
Procedure Name: Intubation Date/Time: 12/13/2017 12:19 PM Performed by: Suan Halter, CRNA Pre-anesthesia Checklist: Patient identified, Emergency Drugs available, Suction available and Patient being monitored Patient Re-evaluated:Patient Re-evaluated prior to induction Oxygen Delivery Method: Circle system utilized Preoxygenation: Pre-oxygenation with 100% oxygen Induction Type: IV induction Ventilation: Mask ventilation without difficulty Laryngoscope Size: Mac and 3 Grade View: Grade II Tube type: Oral Tube size: 7.0 mm Number of attempts: 1 Airway Equipment and Method: Stylet and Oral airway Placement Confirmation: ETT inserted through vocal cords under direct vision,  positive ETCO2 and breath sounds checked- equal and bilateral Secured at: 21 cm Tube secured with: Tape Dental Injury: Teeth and Oropharynx as per pre-operative assessment  Comments: Neck neutral positioned correctly. Face padded with pink head rest foam pillow.

## 2017-12-13 NOTE — H&P (Signed)
Date of Initial H&P: 12/05/2017 History reviewed, patient examined, no change in status, stable for surgery.

## 2017-12-13 NOTE — Progress Notes (Signed)
12/13/2017 7:25 PM Received return phone call from Dr. Rica Koyanagi. Verbal order received for 1 mg IV Dilaudid Q3H prn severe pain. Verbal orders also received ok to d/c foley tonight if patient feels this is the source of her discomfort after ambulation. Orders placed. Will continue to closely monitor patient.  Saylor Sheckler, Arville Lime

## 2017-12-14 ENCOUNTER — Encounter (HOSPITAL_COMMUNITY): Payer: Self-pay | Admitting: Obstetrics and Gynecology

## 2017-12-14 DIAGNOSIS — D251 Intramural leiomyoma of uterus: Secondary | ICD-10-CM | POA: Diagnosis not present

## 2017-12-14 LAB — CBC
HCT: 37.6 % (ref 36.0–46.0)
Hemoglobin: 12.5 g/dL (ref 12.0–15.0)
MCH: 31.3 pg (ref 26.0–34.0)
MCHC: 33.2 g/dL (ref 30.0–36.0)
MCV: 94.2 fL (ref 78.0–100.0)
Platelets: 280 10*3/uL (ref 150–400)
RBC: 3.99 MIL/uL (ref 3.87–5.11)
RDW: 15 % (ref 11.5–15.5)
WBC: 14.9 10*3/uL — ABNORMAL HIGH (ref 4.0–10.5)

## 2017-12-14 MED ORDER — KETOROLAC TROMETHAMINE 30 MG/ML IJ SOLN
INTRAMUSCULAR | Status: AC
  Filled 2017-12-14: qty 1

## 2017-12-14 MED ORDER — HYDROMORPHONE HCL 2 MG PO TABS: 2.0000 mg | ORAL_TABLET | ORAL | 0 refills | Status: DC | PRN

## 2017-12-14 MED ORDER — HYDROMORPHONE HCL 2 MG PO TABS
ORAL_TABLET | ORAL | Status: AC
  Filled 2017-12-14: qty 1

## 2017-12-14 MED ORDER — ONDANSETRON HCL 4 MG PO TABS: 4.0000 mg | ORAL_TABLET | Freq: Four times a day (QID) | ORAL | 0 refills | Status: DC | PRN

## 2017-12-14 MED ORDER — IBUPROFEN 800 MG PO TABS: 800.0000 mg | ORAL_TABLET | Freq: Three times a day (TID) | ORAL | 0 refills | Status: DC | PRN

## 2017-12-14 MED ORDER — ONDANSETRON HCL 4 MG/2ML IJ SOLN
INTRAMUSCULAR | Status: AC
  Filled 2017-12-14: qty 2

## 2017-12-14 MED ORDER — HYDROMORPHONE HCL 1 MG/ML IJ SOLN
INTRAMUSCULAR | Status: AC
  Filled 2017-12-14: qty 1

## 2017-12-14 NOTE — Progress Notes (Signed)
I called Dr. Sundra Aland office and spoke with a staff member.  Asked for Dr. Landry Mellow to return call.  Staff stated that Dr. Landry Mellow was off today but she would get the message to her.

## 2017-12-14 NOTE — Progress Notes (Addendum)
Dr. Landry Mellow called for update and spoke with Manuela Schwartz, RN. Dr. Landry Mellow stated she would call back again for later update

## 2017-12-14 NOTE — Progress Notes (Signed)
Received call from an RN at Dr. Sundra Aland office.  Gave update to her about Amy Barry: Amy Barry voided x 2 each time 100cc;  Amy Barry tolerating fluid and small amounts of food well.  Pain is a 5 and mostly in "back"

## 2017-12-14 NOTE — Progress Notes (Signed)
Dr. Landry Mellow called for update.  Pt's pain level is an 8 and in her back.  Dr. Landry Mellow gave a once order for Dilaudid po. Dr. Landry Mellow stated that she would call back in 2 hrs to check on pt and if needed, she would admit her to hospital.

## 2017-12-15 ENCOUNTER — Ambulatory Visit: Payer: 59 | Admitting: Physical Therapy

## 2017-12-21 DIAGNOSIS — R300 Dysuria: Secondary | ICD-10-CM | POA: Diagnosis not present

## 2017-12-26 NOTE — Discharge Summary (Signed)
Physician Discharge Summary  Patient ID: Amy Barry MRN: [id number] DOB/AGE: 1970-10-16 47 y.o.  Admit date: 12/13/2017 Discharge date: 12/14/2017  Admission Diagnoses: 1) Abnormal Uterine bleeding 2) uterine fibroids   Discharge Diagnoses:  Active Problems:   S/P hysterectomy   Discharged Condition: stable  Hospital Course: pt was admitted for 23 hour observation after undergoing robotic assisted laparoscopic hysterectomy with bilateral salpingectomy . She did well postoperatively with return of bowel and bladder function Hgb on pod #1 was 12.5.   Consults: None  Significant Diagnostic Studies: see above   Treatments: surgery: robotic assisted laparoscopic hysterectomy with bilateral salpingectomy  Discharge Exam: Blood pressure 134/76, pulse 87, temperature 99.3 F (37.4 C), resp. rate 14, height 5' 1.5" (1.562 m), weight 83.5 kg, last menstrual period 11/26/2017, SpO2 100 %. General appearance: alert and cooperative Resp: clear to auscultation bilaterally Cardio: regular rate and rhythm GI: soft appropriately tender nondistended +BS in all 4 quadrants  Extremities: extremities normal, atraumatic, no cyanosis or edema Incision/Wound:clean and dry.. The incision in the right upper quadrant exhibited slight seperation of wound edges.Marland Kitchen dermabond was applied.   Disposition:  stable   Discharge Instructions    Call MD for:  persistant nausea and vomiting   Complete by:  As directed    Call MD for:  redness, tenderness, or signs of infection (pain, swelling, redness, odor or green/yellow discharge around incision site)   Complete by:  As directed    Call MD for:  severe uncontrolled pain   Complete by:  As directed    Call MD for:  temperature >100.4   Complete by:  As directed    Diet - low sodium heart healthy   Complete by:  As directed    Driving Restrictions   Complete by:  As directed    Avoid driving for 2 weeks   Increase activity slowly   Complete by:  As  directed    Lifting restrictions   Complete by:  As directed    Avoid lifting over 10 lbs   Sexual Activity Restrictions   Complete by:  As directed    Avoid sex for 6-8 weeks     Allergies as of 12/14/2017      Reactions   Imitrex [sumatriptan] Other (See Comments)   High blood pressure   Demerol [meperidine] Nausea And Vomiting   Oxycodone Other (See Comments)   DROWSY   Reglan [metoclopramide] Other (See Comments)   "shakiness"   Tramadol Nausea Only   Codeine Palpitations   Propoxyphene N-acetaminophen Nausea And Vomiting      Medication List    TAKE these medications   docusate sodium 50 MG capsule Commonly known as:  COLACE Take 50 mg by mouth daily as needed (for constipation.).   furosemide 40 MG tablet Commonly known as:  LASIX Take 1 tablet (40 mg total) by mouth daily. What changed:  when to take this   HYDROmorphone 2 MG tablet Commonly known as:  DILAUDID Take 1 tablet (2 mg total) by mouth every 4 (four) hours as needed for severe pain.   hydroxychloroquine 200 MG tablet Commonly known as:  PLAQUENIL Take 200 mg by mouth at bedtime.   ibuprofen 200 MG tablet Commonly known as:  ADVIL,MOTRIN Take 600-800 mg by mouth every 6 (six) hours as needed (for pain/fever). What changed:  Another medication with the same name was added. Make sure you understand how and when to take each.   ibuprofen 800 MG tablet Commonly known as:  ADVIL,MOTRIN Take 1 tablet (800 mg total) by mouth every 8 (eight) hours as needed (mild pain). What changed:  You were already taking a medication with the same name, and this prescription was added. Make sure you understand how and when to take each.   metoprolol tartrate 100 MG tablet Commonly known as:  LOPRESSOR TAKE ONE TABLET BY MOUTH TWICE DAILY   multivitamin with minerals Tabs tablet Take 1 tablet by mouth daily.   omeprazole 40 MG capsule Commonly known as:  PRILOSEC Take 40 mg by mouth daily.   ondansetron 4 MG  tablet Commonly known as:  ZOFRAN Take 1 tablet (4 mg total) by mouth every 6 (six) hours as needed for nausea.   pantoprazole 40 MG tablet Commonly known as:  PROTONIX Take 40 mg by mouth daily as needed (for acid reflux/indigestion).   potassium chloride 10 MEQ tablet Commonly known as:  K-DUR TAKE ONE TABLET BY MOUTH ONCE DAILY What changed:    how much to take  how to take this  when to take this   predniSONE 10 MG tablet Commonly known as:  DELTASONE Take 5 mg by mouth daily at 3 pm.   tranexamic acid 650 MG Tabs tablet Commonly known as:  LYSTEDA Take 1,300 mg by mouth 3 (three) times daily as needed (for heavy menstrual periods).   TURMERIC PO Take 1 capsule by mouth daily.      Follow-up Information    Christophe Louis, MD. Go in 2 week(s).   Specialty:  Obstetrics and Gynecology Why:  postoperative visit  Contact information: [address] [place] [address] [phone]           Signed: Christophe Louis 12/26/2017, 11:03 PM

## 2017-12-29 ENCOUNTER — Ambulatory Visit: Payer: Self-pay | Admitting: Physical Therapy

## 2017-12-29 ENCOUNTER — Ambulatory Visit: Payer: 59 | Admitting: Physical Therapy

## 2018-01-05 ENCOUNTER — Ambulatory Visit: Payer: Self-pay | Admitting: Physical Therapy

## 2018-01-05 DIAGNOSIS — D509 Iron deficiency anemia, unspecified: Secondary | ICD-10-CM | POA: Diagnosis not present

## 2018-01-05 DIAGNOSIS — I10 Essential (primary) hypertension: Secondary | ICD-10-CM | POA: Diagnosis not present

## 2018-01-12 ENCOUNTER — Ambulatory Visit: Payer: 59 | Admitting: Physical Therapy

## 2018-01-16 DIAGNOSIS — Z79899 Other long term (current) drug therapy: Secondary | ICD-10-CM | POA: Diagnosis not present

## 2018-01-18 DIAGNOSIS — N898 Other specified noninflammatory disorders of vagina: Secondary | ICD-10-CM | POA: Diagnosis not present

## 2018-03-09 DIAGNOSIS — M2550 Pain in unspecified joint: Secondary | ICD-10-CM | POA: Diagnosis not present

## 2018-03-09 DIAGNOSIS — M359 Systemic involvement of connective tissue, unspecified: Secondary | ICD-10-CM | POA: Diagnosis not present

## 2018-03-09 DIAGNOSIS — M791 Myalgia, unspecified site: Secondary | ICD-10-CM | POA: Diagnosis not present

## 2018-03-27 IMAGING — CT CT CERVICAL SPINE W/O CM
3 of 4 series · 13 of 33 positions shown, 16 images · non-contrast
Comparison: Comparison made with previous MRI from 08/03/2016.

CLINICAL DATA: Follow-up examination for arm previous MRI, possible
displacement of vertebral arthrodesis.

EXAM:
CT CERVICAL SPINE WITHOUT CONTRAST
TECHNIQUE: Multidetector CT imaging of the cervical spine was performed without
intravenous contrast. Multiplanar CT image reconstructions were also
generated.

[Series 4: c_spine 2.0 st · axial · 0.28mm/px · z∈[+812,+924]mm · 5 of 85 slices shown, 7 images]
[im 15/85  soft-tissue]
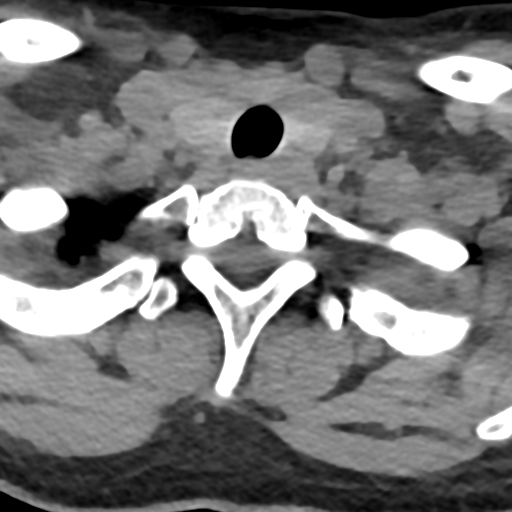
[im 15/85  bone]
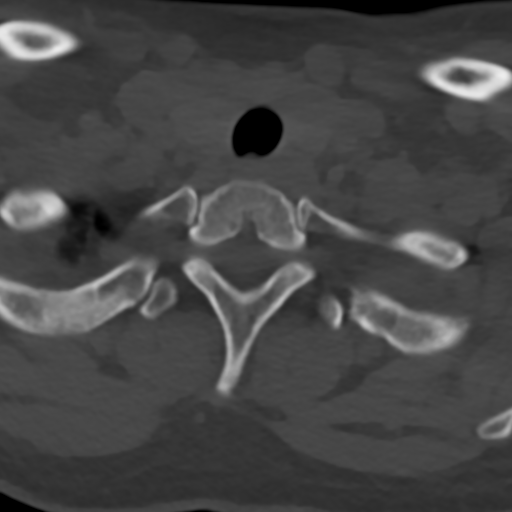
[im 29/85  bone]
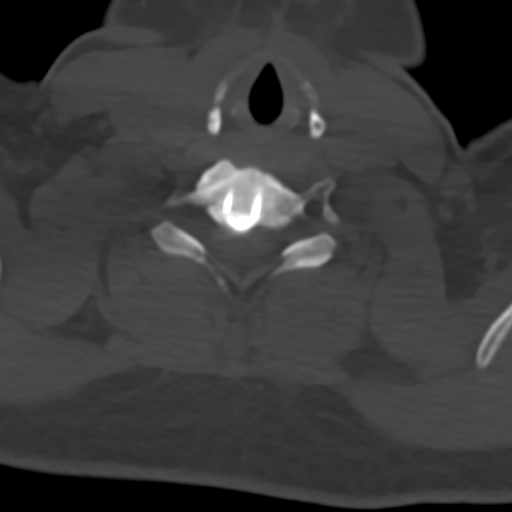
[im 43/85  bone]
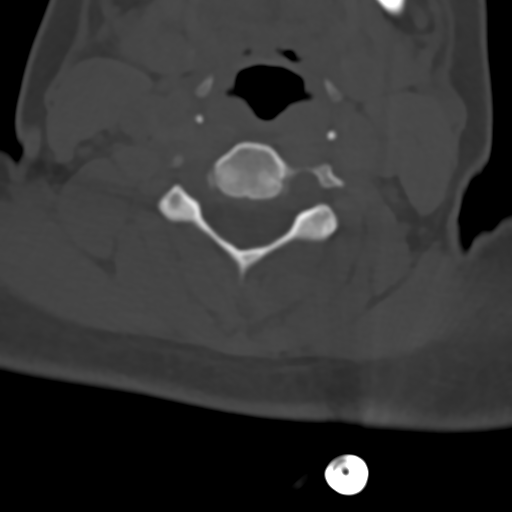
[im 57/85  bone]
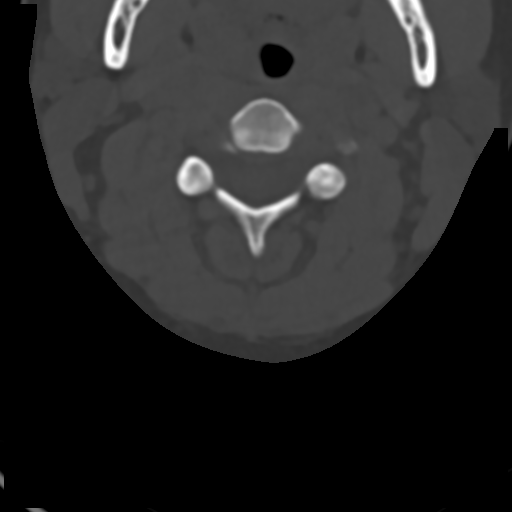
[im 71/85  soft-tissue]
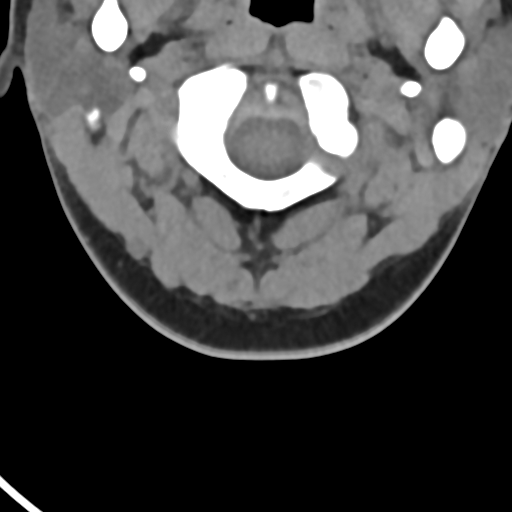
[im 71/85  bone]
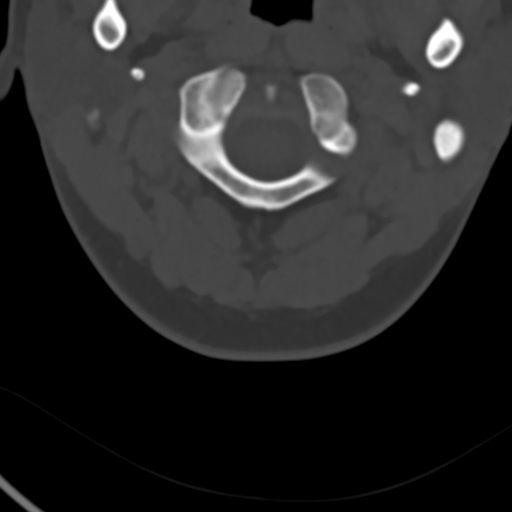

[Series 6: c_spine 2.0 sag bone · sagittal · 0.25mm/px · 5 of 61 slices shown, 6 images]
[im 21/61  bone]
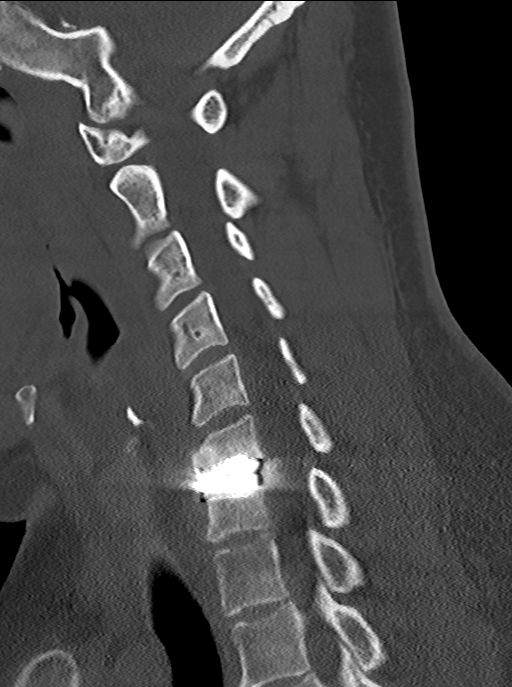
[im 26/61  bone]
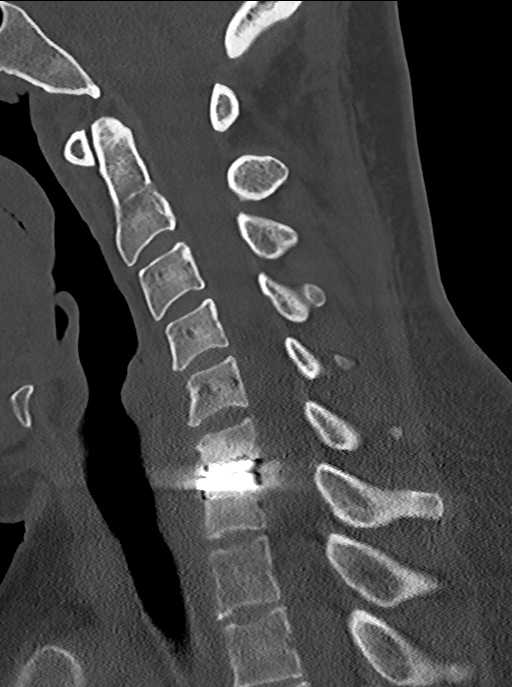
[im 31/61  soft-tissue]
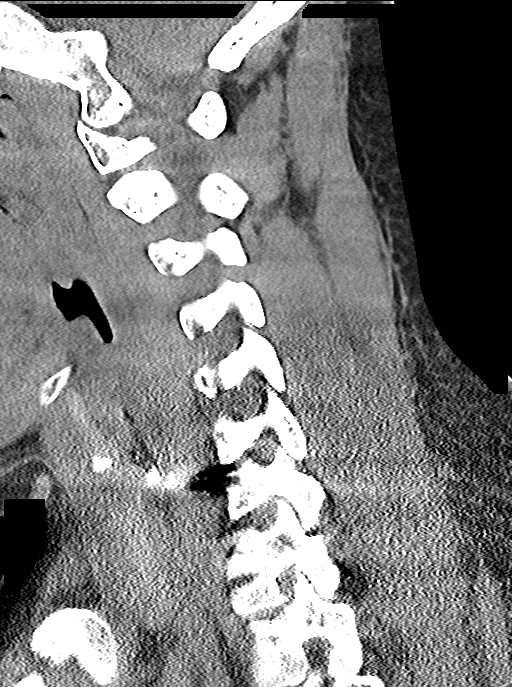
[im 31/61  bone]
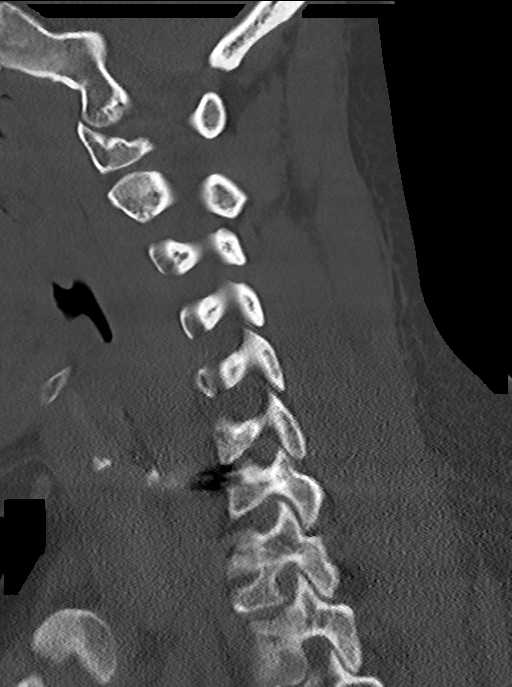
[im 36/61  bone]
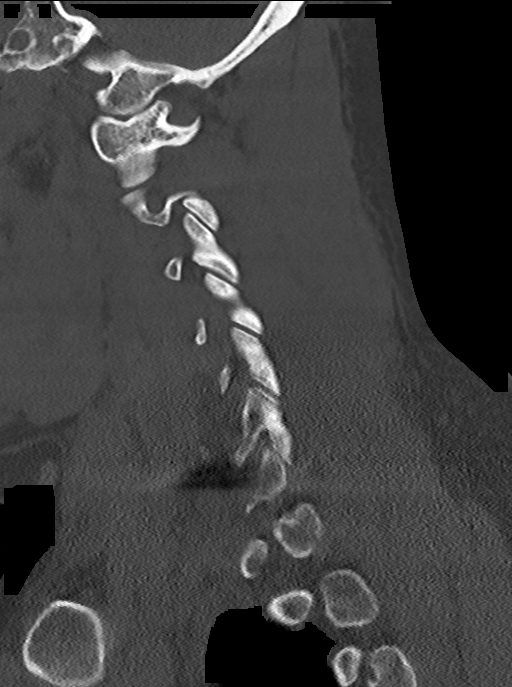
[im 41/61  bone]
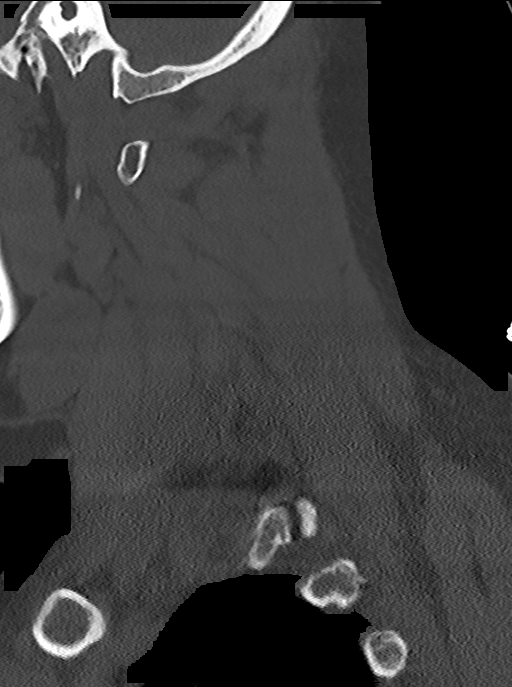

[Series 7: c_spine 2.0 cor bone · coronal · 0.25mm/px · 3 of 61 slices shown]
[im 13/61  bone]
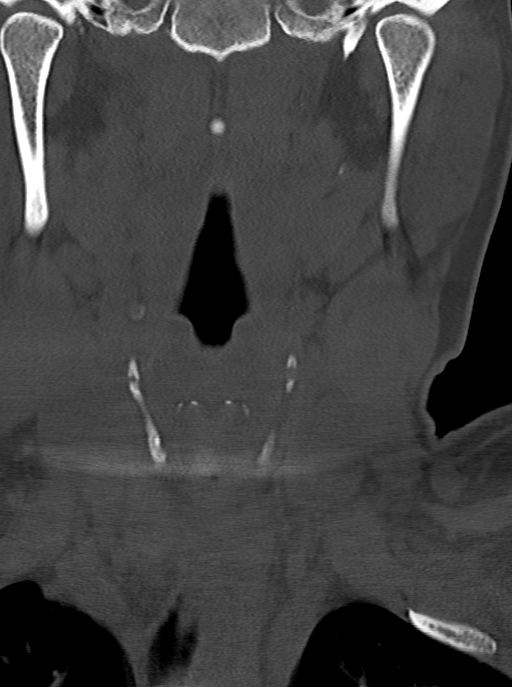
[im 25/61  bone]
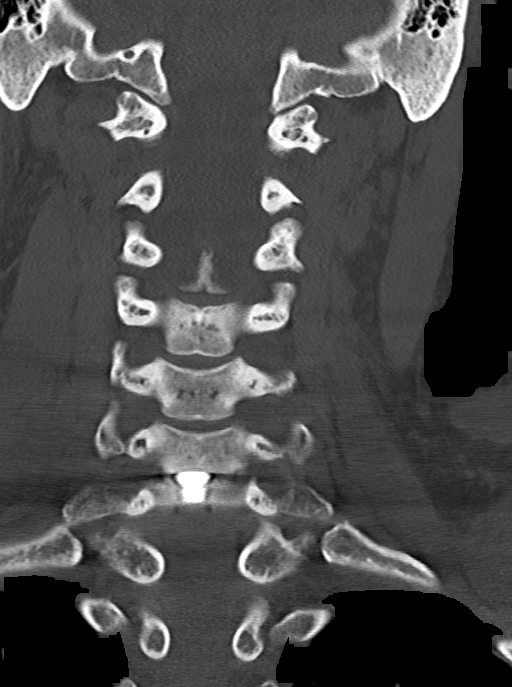
[im 37/61  bone]
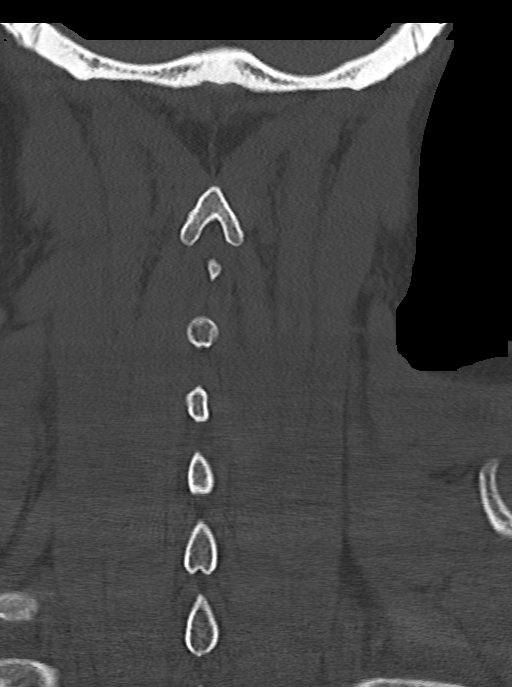

[13 of 33 positions shown; findings below may reference images not displayed]

FINDINGS: Alignment: Straightening with slight reversal of the normal cervical
lordosis. No listhesis or malalignment.

Skull base and vertebrae: Skullbase intact. Normal C1-2
articulations are well preserved. Dens is intact. Vertebral body
heights are well maintained. No evidence for acute or chronic
fracture.

Patient is status post arthrodesis at C6-7. On current exam, the
hardware appears fairly well positioned, with only minimal 1-2 mm
posterior extension outside the confines of the parent C6-7
interspace. The spinal canal this level it is widely patent
measuring approximately 11 mm in AP diameter. No complication
identified.

Soft tissues and spinal canal: Paraspinous soft tissues demonstrate
no acute abnormality. No abnormal prevertebral edema. Spinal canal
within normal limits.

Disc levels: Multifocal disc protrusions at C3-4, C4-5, and C5-6,
better seen and characterized on recent MRI.

Upper chest: Visualized upper chest within normal limits. Visualized
lung apices are clear. No apical pneumothorax.

Other: No other significant finding.
IMPRESSION: 1. No acute abnormality within the cervical spine.
2. C6-7 arthrodesis in place, and appears well positioned on this
exam, with minimal 2 mm extension posterior to the parent C6-7
interspace. No other complication identified.
3. Multilevel disc protrusions at C3-4 through C5-6, better
evaluated on recent MRI.

## 2018-04-11 DIAGNOSIS — R768 Other specified abnormal immunological findings in serum: Secondary | ICD-10-CM | POA: Diagnosis not present

## 2018-04-11 DIAGNOSIS — M359 Systemic involvement of connective tissue, unspecified: Secondary | ICD-10-CM | POA: Diagnosis not present

## 2018-06-11 DIAGNOSIS — R768 Other specified abnormal immunological findings in serum: Secondary | ICD-10-CM | POA: Diagnosis not present

## 2018-06-11 DIAGNOSIS — M359 Systemic involvement of connective tissue, unspecified: Secondary | ICD-10-CM | POA: Diagnosis not present

## 2018-06-11 DIAGNOSIS — R531 Weakness: Secondary | ICD-10-CM | POA: Diagnosis not present

## 2018-06-22 ENCOUNTER — Encounter: Payer: Self-pay | Admitting: Physical Therapy

## 2018-06-22 NOTE — Therapy (Signed)
[facility] [address] [place], [place], [address] Phone: [phone]   Fax:  [phone]  Patient Details  Name: Amy Barry MRN: [id number] Date of Birth: Jun 26, 1970 Referring Provider:  No ref. provider found  Encounter Date: 06/22/2018  PHYSICAL THERAPY DISCHARGE SUMMARY  Visits from Start of Care: 54  Current functional level related to goals / functional outcomes: Unable to assess; pt was placed on hold and was scheduled to return to therapy in October of 2019 but the visit was cancelled by the patient.  Pt did not contact clinic to reschedule.   Remaining deficits: Impaired coordination, impaired balance, impaired gait   Education / Equipment: HEP  Plan: Patient agrees to discharge.  Patient goals were not met. Patient is being discharged due to not returning since the last visit.  ?????     Rico Junker, PT, DPT 06/22/18    2:47 PM    [facility] [address] [place], [place], [address] Phone: [phone]   Fax:  [phone]

## 2018-07-20 DIAGNOSIS — D509 Iron deficiency anemia, unspecified: Secondary | ICD-10-CM | POA: Diagnosis not present

## 2018-07-20 DIAGNOSIS — I10 Essential (primary) hypertension: Secondary | ICD-10-CM | POA: Diagnosis not present

## 2018-07-20 DIAGNOSIS — M359 Systemic involvement of connective tissue, unspecified: Secondary | ICD-10-CM | POA: Diagnosis not present

## 2018-07-24 DIAGNOSIS — R531 Weakness: Secondary | ICD-10-CM | POA: Diagnosis not present

## 2018-07-24 DIAGNOSIS — R768 Other specified abnormal immunological findings in serum: Secondary | ICD-10-CM | POA: Diagnosis not present

## 2018-07-24 DIAGNOSIS — M359 Systemic involvement of connective tissue, unspecified: Secondary | ICD-10-CM | POA: Diagnosis not present

## 2019-02-26 ENCOUNTER — Encounter: Payer: Self-pay | Admitting: Neurology

## 2019-03-04 ENCOUNTER — Ambulatory Visit
Admission: RE | Admit: 2019-03-04 | Discharge: 2019-03-04 | Disposition: A | Discharge status: Home / Self Care | Payer: 59 | Source: Ambulatory Visit | Attending: Internal Medicine | Admitting: Internal Medicine

## 2019-03-04 ENCOUNTER — Ambulatory Visit (INDEPENDENT_AMBULATORY_CARE_PROVIDER_SITE_OTHER): Payer: 59 | Admitting: Internal Medicine

## 2019-03-04 ENCOUNTER — Other Ambulatory Visit: Payer: Self-pay

## 2019-03-04 ENCOUNTER — Encounter: Payer: Self-pay | Admitting: Internal Medicine

## 2019-03-04 DIAGNOSIS — M353 Polymyalgia rheumatica: Secondary | ICD-10-CM | POA: Insufficient documentation

## 2019-03-05 NOTE — Progress Notes (Addendum)
[facility]      Reason for Consult: polymyalgias    Referring Physician: Dr. Nancy Fetter    Patient ID: Amy Barry, female    DOB: December 22, 1970, 48 y.o.   MRN: [id number]  HPI:   Here for evaluation of longstanding symptoms.  Most of her symptoms began about 3 years ago and include bilateral leg weakness, occasional malar rash on her face, back and hip pain and some fever up to 101, though typically around 100.  She has been seen by rheumatology and had a negative work up, neurology, and noted leg weakness and is being referred back to neurology. Has had severe fatigue.   Previous record reviewed from the PCP and recent labs including normal CMP She has seen improvement with steroids.  Hss been on Plaquinil and improved with twice a day dosing, but due to GI upset, only able to take once a day.   Past Medical History:  Diagnosis Date  . Arthralgia   . Generalized weakness    bilateral upper and lower extremities,  walks with cane  . GERD (gastroesophageal reflux disease)   . Headache(784.0)   . Heart murmur    per pt "since childhood, no problems"  . History of kidney stones   . Hypertension    followed pcp  . IBS (irritable bowel syndrome)   . Iron deficiency anemia    12-06-2017 per pt recently had IV iron infusion July 2019  . Low vitamin D level   . Osteoarthritis   . PVC (premature ventricular contraction)    per pt had holter monitor  . SLE (systemic lupus erythematosus) ([place])    rheumotologist-- dr syed  . Wears glasses     Prior to Admission medications   Medication Sig Start Date End Date Taking? Authorizing Provider  cholecalciferol (VITAMIN D3) 25 MCG (1000 UT) tablet  02/28/19  Yes [provider]  docusate sodium (COLACE) 50 MG capsule Take 50 mg by mouth daily as needed (for constipation.).   Yes [provider]  furosemide (LASIX) 40 MG tablet Take 1 tablet (40 mg total) by mouth daily. Patient taking differently:  Take 40 mg by mouth every morning.  08/21/15  Yes Laurey Morale, MD  hydroxychloroquine (PLAQUENIL) 200 MG tablet Take 200 mg by mouth 2 (two) times daily.  10/31/17  Yes [provider]  ibuprofen (ADVIL,MOTRIN) 200 MG tablet Take 600-800 mg by mouth every 6 (six) hours as needed (for pain/fever).   Yes [provider]  metoprolol (LOPRESSOR) 100 MG tablet TAKE ONE TABLET BY MOUTH TWICE DAILY Patient taking differently: Take 100 mg by mouth 2 (two) times daily.  12/31/14  Yes Laurey Morale, MD  Multiple Vitamin (MULTIVITAMIN WITH MINERALS) TABS Take 1 tablet by mouth daily.    Yes [provider]  omeprazole (PRILOSEC) 40 MG capsule Take 40 mg by mouth daily.   Yes [provider]  ondansetron (ZOFRAN) 4 MG tablet Take 1 tablet (4 mg total) by mouth every 6 (six) hours as needed for nausea. 12/14/17  Yes Christophe Louis, MD  pantoprazole (PROTONIX) 40 MG tablet Take 40 mg by mouth daily as needed (for acid reflux/indigestion).    Yes [provider]  potassium chloride (K-DUR) 10 MEQ tablet TAKE ONE TABLET BY MOUTH ONCE DAILY Patient taking differently: TAKE 10 MEQ BY MOUTH ONCE DAILY 12/31/14  Yes Laurey Morale, MD  TURMERIC PO Take 1 capsule by mouth daily.   Yes  [provider]  gabapentin (NEURONTIN) 300 MG capsule Take 300 mg by mouth 3 (three) times daily as needed. 01/17/19   [provider]    Allergies  Allergen Reactions  . Imitrex [Sumatriptan] Other (See Comments)    High blood pressure  . Demerol [Meperidine] Nausea And Vomiting  . Oxycodone Other (See Comments)    DROWSY  . Reglan [Metoclopramide] Other (See Comments)    "shakiness"  . Tramadol Nausea Only  . Codeine Palpitations  . Propoxyphene N-Acetaminophen Nausea And Vomiting    Social History   Tobacco Use  . Smoking status: Never Smoker  . Smokeless tobacco: Never Used  Substance Use Topics  . Alcohol use: No    Alcohol/week: 0.0 standard drinks  . Drug  use: Never    Family History  Problem Relation Age of Onset  . Arthritis Unknown   . Hyperlipidemia Unknown   . Hypertension Daughter   . Colon polyps Daughter 9       hermatoma oversized polyps-benign  . Stroke Unknown   . Leukemia Maternal Grandmother   . Heart disease Maternal Grandmother   . Asthma Sister   . Hypertension Mother   . Atrial fibrillation Mother   . Sarcoidosis Mother   . Healthy Father   . Colon cancer Neg Hx     Review of Systems  Constitutional: positive for fevers, fatigue and malaise or negative for weight loss and some weight gain on steroids Respiratory: negative for cough, sputum or hemoptysis Gastrointestinal: negative for nausea and diarrhea Integument/breast: positive for rash on her face, negative for dryness Musculoskeletal: positive for myalgias, arthralgias, stiff joints, back pain and muscle weakness, negative for neck pain Neurological: positive for gait problems and weakness, negative for headaches and dizziness All other systems reviewed and are negative    Constitutional: in no apparent distress  Vitals:   03/04/19 1503  BP: (!) 157/82  Pulse: 92  Temp: 98.1 F (36.7 C)  SpO2: 98%   EYES: anicteric ENMT: no thrush Cardiovascular: Cor RRR Respiratory: CTA B; normal respiratory effort GI: Bowel sounds are normal, liver is not enlarged, spleen is not enlarged Musculoskeletal: no pedal edema noted; no arthritis Skin: negatives: no rash Hematologic: no cervical lad Neuro: non-focal  Labs: Lab Results  Component Value Date   WBC 14.9 (H) 12/14/2017   HGB 12.5 12/14/2017   HCT 37.6 12/14/2017   MCV 94.2 12/14/2017   PLT 280 12/14/2017    Lab Results  Component Value Date   CREATININE 0.67 12/06/2017   BUN 12 12/06/2017   NA 144 12/06/2017   K 3.5 12/06/2017   CL 107 12/06/2017   CO2 29 12/06/2017    Lab Results  Component Value Date   ALT 14 12/06/2017   AST 15 12/06/2017   ALKPHOS 59 12/06/2017   BILITOT 0.4  12/06/2017   INR 1.12 10/23/2015     Assessment: polymylagias, fatigue.  I discussed possible etiologies including chronic infections such as hepatitis C, hepatitis B, HIV.  Also checking myositis labs, multiple myeloma.  To date, ESR is significantly elevated to 117.  Will monitor the other labs and if no other issue, will discuss with Dr. Amil Amen if the isolated elevation in ESR warrants further investigation.   Plan: 1) labs as above 2) hip xray done and no concerns with sacroiliitis  ADDENDUM 12/7.  I spoke with Dr. Amil Amen today and he will see her in follow up with the new findings of an elevated ESR and CRP.

## 2019-03-12 LAB — CK: Total CK: 77 U/L (ref 29–143)

## 2019-03-12 LAB — PROTEIN ELECTROPHORESIS, SERUM, WITH REFLEX
Albumin ELP: 3.8 g/dL (ref 3.8–4.8)
Alpha 1: 0.3 g/dL (ref 0.2–0.3)
Alpha 2: 0.9 g/dL (ref 0.5–0.9)
Beta 2: 0.5 g/dL (ref 0.2–0.5)
Beta Globulin: 0.4 g/dL (ref 0.4–0.6)
Gamma Globulin: 1.1 g/dL (ref 0.8–1.7)
Total Protein: 7 g/dL (ref 6.1–8.1)

## 2019-03-12 LAB — LACTATE DEHYDROGENASE: LDH: 138 U/L (ref 100–200)

## 2019-03-12 LAB — HEPATITIS B SURFACE ANTIGEN: Hepatitis B Surface Ag: NONREACTIVE

## 2019-03-12 LAB — HEPATITIS C ANTIBODY
Hepatitis C Ab: NONREACTIVE
SIGNAL TO CUT-OFF: 0.04 (ref <1.00)

## 2019-03-12 LAB — HLA-B27 ANTIGEN: HLA-B27 Antigen: POSITIVE — AB

## 2019-03-12 LAB — SEDIMENTATION RATE: Sed Rate: 117 mm/h — ABNORMAL HIGH (ref 0–20)

## 2019-03-12 LAB — C-REACTIVE PROTEIN: CRP: 13 mg/L — ABNORMAL HIGH (ref <8.0)

## 2019-03-12 LAB — HIV-1 RNA QUANT-NO REFLEX-BLD
HIV 1 RNA Quant: <20 copies/mL
HIV-1 RNA Quant, Log: <1.3 Log copies/mL

## 2019-03-12 LAB — HIV ANTIBODY (ROUTINE TESTING W REFLEX): HIV 1&2 Ab, 4th Generation: NONREACTIVE

## 2019-04-16 NOTE — Progress Notes (Addendum)
NEUROLOGY CONSULTATION NOTE  [name] MRN: [id number] DOB: [birth year]  Referring provider: Marilynne Drivers, PA Primary care provider: Donald Prose, MD  Reason for consult:  Myalgia/arthralgia/weakness  HISTORY OF PRESENT ILLNESS: Amy Barry. Amy Barry is a 49 year old right-handed black female who presents for myalgias, arthralgia and weakness.  History supplemented by notes from multiple providers, including PCP, rheumatology, infectious disease, hospital records and prior neurologists.  MRI of brain (08/03/16), cervical spine (10/27/11, 09/12/15, 08/03/16, 08/04/16), and lumbar spine (10/06/16) personally reviewed.  She began experiencing extreme fatigue around February 2018.  At that time, she began experiencing right leg weakness followed by left leg weakness.  She also reported numbness and tingling.  She started feeling unsteady on her feet.  Over the course of 2 to 3 weeks, she gradually became weaker, involving the arms and then became almost paralyzed.  She was then admitted to [facility] that May.      At the time, she began to experience right leg numbness and weakness, in which her leg would sometimes give out.  She started feeling unsteady on her feet.  Weakness gradually generalized, involving all extremities.  She has history of decompressive anterior cervical discectomy at C6-7 and cervical disc arthroplasty C6-7 in 2017 after presenting with left sided neck and arm pain.  She continued to have some residual left arm numbness since the surgery.  She was evaluated by neurologist, [name], in 2018.  MRIs of brain, cervical, thoracic and lumbar spine revealed no cause for her worsening gait.  Labs were overall unremarkable.  Acetylcholine receptor antibody, copper, ferritin, CPK, HIV, RPR, HIV were negative.  EB virus IgG was positive but IgM was negative.  ESR was mildly elevated at 50, but this has been chronic for several years (since 2016) and ANA and CRP were negative.  NCV-EMG was normal.   She was admitted to [facility] in May of 2018 after 2 to 3 week history of significant worsening weakness, to the point where she was almost paralyzed from the neck down.  Deep tendon reflexes were brisk.  She exhibited tremor and dystonic movements of her feet.  Thorough exam by [facility] neurology found her exam inconsistent and determined that her symptoms were functional.  She was discharged with PT/OT.  She followed up with [facility] neurologist [name].  She underwent a lumbar puncture on in August 2018, which was unremarkable with no bands, elevated IgG index, malignancy, infection.  Serology for Lyme, HTLV I/II antibodies, NMO antibodies and paraneoplastic panel were also negative.  Since then, she has continued to have bilateral leg weakness, left greater than right, dystonic movements of her feet, diffuse muscle and joint pain and numbness and tingling as well as unsteady gait.  She has been followed by rheumatology and possible underlying lupus has been entertained.  Over the past 6 months, she states she started experiencing urinary incontinence and intermittent episodes of complete right sided hemisensory loss from head down to toes, lasting up to 3 days at a time.  She has since seen [name] of infectious disease in November 2020.  Hepatitis B and C, HIV, SPEP, CK, LDH were negative.  HLA-B27 antigen was positive.  CRP was elevated at 13 but ESR was further elevated at 117.  Due to the more elevated sed rate, her rheumatologist has started her on methotrexate.  She denies family history of neurologic or rheumatologic disorders (other than osteoarthritis).    She underwent extensive workup. IMAGING:  10/27/2011 MRI CERVICAL SPINE WO:  Tiny central disc bulges from C2-3 through C4-5 with no significant neural impingement. 06/19/2012 CTA HEAD:  Negative 09/12/2015 MRI CERVICAL SPINE WO:  Progressed cervical spondylosis and degenerative disc disease with left foraminal disc protrusion and nerve  impingement at C6-7, moderate impingement at C3-4 and C4-5 and mild impingement at C5-6. 08/03/2016 MRI BRAIN WO:  Unremarkable 08/03/2016 MRI CERVICAL SPINE WO:  Status post arthrodesis at C6-7 with questionable posterior migration of C6-7 implant, mild to moderate canal stenosis with mild cord flattening without definite cord signal changes; moderate degenerative spondylosis with disc protrusions at C3-4, C4-5, and C5-6. 08/04/2016 MRI CERVICAL SPINE WO:  C6-7 arthrodesis now appears in place. 10/06/2016 MRI LUMBAR SPINE WO:  Degenerative disc disease and mild bilateral foraminal narrowing at L5-S1 without significant compression; mild ligamentum flavum hypertrophy and facet degenerative facet changes at L3-4 and L4-5 without compression. 03/04/2019 X-RAY BILATERAL HIP/PELVIS:  Mild degenerative changes both hips.  Otherwise, unremarkable.  LABS: April 2018:  Elevated ESR 42; elevated EBV antibody IgG over 600, elevated EB virus early antigen antibody 11.1, elevated EBV nuclear antigen antibody IgG 25; elevated parvovirus B19 IgG antibody 5.2; elevated CMV IgG antibody 5.9; vitamin D 36.5; low iron 41; negative ANA, CRP 4.3, vitamin B12 256, TSH 1.86. May 2018:  Ach receptor antibody negative, ANA negative, mildly elevated ESR 50; CRP 4.4; CK 54; copper 109; ferritin 16; B12 359; folate 11.2; RPR nonreactive; HIV nonreactive June 2018:  Sed rate 29; CRP 4.8; CK 61 August 2018:  CSF cell count 0, protein 22, glucose 57, cytospin negative, ACE <0.4, 0 oligoclonal bands, negative myelin basic protein, negative VDRL August 2018:  Negative Lyme serology, negative paraneoplastic panel, negative NMO-IgG antibody, HTLV I/II antibodies November 2020:  Sed rate 117; CRP 13; SPEP normal; CK 77; HIV 1/2 negative; Hep B surface Ag nonreactive; Hep C antibody nonreactive; HLA-B27 antigen positive  OTHER TESTING: 09/09/2016 NCV-EMG:  Normal.  No evidence of large fiber peripheral neuropathy, right lumbosacral  radiculopathy, right cervical radiculopathy or inflammatory myopathy.  PAST MEDICAL HISTORY: Past Medical History:  Diagnosis Date  . Arthralgia   . Generalized weakness    bilateral upper and lower extremities,  walks with cane  . GERD (gastroesophageal reflux disease)   . Headache(784.0)   . Heart murmur    per pt "since childhood, no problems"  . History of kidney stones   . Hypertension    followed pcp  . IBS (irritable bowel syndrome)   . Iron deficiency anemia    12-06-2017 per pt recently had IV iron infusion July 2019  . Low vitamin D level   . Osteoarthritis   . PVC (premature ventricular contraction)    per pt had holter monitor  . SLE (systemic lupus erythematosus) ([place])    rheumotologist-- dr syed  . Wears glasses     PAST SURGICAL HISTORY: Past Surgical History:  Procedure Laterality Date  . CERVICAL DISC ARTHROPLASTY N/A 10/23/2015   Procedure: Cervical Disc Arthroplasty Cervical six-seven;  Surgeon: Eustace Moore, MD;  Location: [facility];  Service: Neurosurgery;  Laterality: N/A;  . D & C HYSTEROSCOPY W/ RESECTION ENDOMETRIAL POLYP  10-13-2003    dr rivard '@[facility]'   . ESOPHAGOGASTRODUODENOSCOPY     for GERD  . LAPAROSCOPIC ABDOMINAL EXPLORATION  1996   dx ibs  . OVARIAN CYST SURGERY  age 51   x2 cyst  . ROBOTIC ASSISTED TOTAL HYSTERECTOMY N/A 12/13/2017   Procedure: XI ROBOTIC ASSISTED TOTAL HYSTERECTOMYWITH BILATERAL  SALPINGECTOMY;  Surgeon: Christophe Louis, MD;  Location: [facility];  Service: Gynecology;  Laterality: N/A;  . TUBAL LIGATION Bilateral 06-01-2002   dr Mancel Bale '@[facility]'    PPTL    MEDICATIONS: Current Outpatient Medications on File Prior to Visit  Medication Sig Dispense Refill  . cholecalciferol (VITAMIN D3) 25 MCG (1000 UT) tablet     . docusate sodium (COLACE) 50 MG capsule Take 50 mg by mouth daily as needed (for constipation.).    Marland Kitchen furosemide (LASIX) 40 MG tablet Take 1 tablet (40 mg total) by mouth daily. (Patient taking differently: Take 40 mg by  mouth every morning. ) 90 tablet 3  . gabapentin (NEURONTIN) 300 MG capsule Take 300 mg by mouth 3 (three) times daily as needed.    . hydroxychloroquine (PLAQUENIL) 200 MG tablet Take 200 mg by mouth 2 (two) times daily.   1  . ibuprofen (ADVIL,MOTRIN) 200 MG tablet Take 600-800 mg by mouth every 6 (six) hours as needed (for pain/fever).    . metoprolol (LOPRESSOR) 100 MG tablet TAKE ONE TABLET BY MOUTH TWICE DAILY (Patient taking differently: Take 100 mg by mouth 2 (two) times daily. ) 180 tablet 3  . Multiple Vitamin (MULTIVITAMIN WITH MINERALS) TABS Take 1 tablet by mouth daily.     Marland Kitchen omeprazole (PRILOSEC) 40 MG capsule Take 40 mg by mouth daily.    . ondansetron (ZOFRAN) 4 MG tablet Take 1 tablet (4 mg total) by mouth every 6 (six) hours as needed for nausea. 20 tablet 0  . pantoprazole (PROTONIX) 40 MG tablet Take 40 mg by mouth daily as needed (for acid reflux/indigestion).     . potassium chloride (K-DUR) 10 MEQ tablet TAKE ONE TABLET BY MOUTH ONCE DAILY (Patient taking differently: TAKE 10 MEQ BY MOUTH ONCE DAILY) 90 tablet 3  . TURMERIC PO Take 1 capsule by mouth daily.     No current facility-administered medications on file prior to visit.    ALLERGIES: Allergies  Allergen Reactions  . Imitrex [Sumatriptan] Other (See Comments)    High blood pressure  . Demerol [Meperidine] Nausea And Vomiting  . Oxycodone Other (See Comments)    DROWSY  . Reglan [Metoclopramide] Other (See Comments)    "shakiness"  . Tramadol Nausea Only  . Codeine Palpitations  . Propoxyphene N-Acetaminophen Nausea And Vomiting    FAMILY HISTORY: Family History  Problem Relation Age of Onset  . Arthritis Unknown   . Hyperlipidemia Unknown   . Hypertension Daughter   . Colon polyps Daughter 9       hermatoma oversized polyps-benign  . Stroke Unknown   . Leukemia Maternal Grandmother   . Heart disease Maternal Grandmother   . Asthma Sister   . Hypertension Mother   . Atrial fibrillation Mother     . Sarcoidosis Mother   . Healthy Father   . Colon cancer Neg Hx     SOCIAL HISTORY: Social History   Socioeconomic History  . Marital status: Married    Spouse name: Not on file  . Number of children: 2  . Years of education: College  . Highest education level: Not on file  Occupational History  . Occupation: Programmer, multimedia: [employer]  Tobacco Use  . Smoking status: Never Smoker  . Smokeless tobacco: Never Used  Substance and Sexual Activity  . Alcohol use: No    Alcohol/week: 0.0 standard drinks  . Drug use: Never  . Sexual activity: Yes    Partners: Male  Birth control/protection: Surgical  Other Topics Concern  . Not on file  Social History Narrative   Lives at home with husband.   Right-handed.   Occasional caffeine use.   Social Determinants of Health   Financial Resource Strain:   . Difficulty of Paying Living Expenses: Not on file  Food Insecurity:   . Worried About Charity fundraiser in the Last Year: Not on file  . Ran Out of Food in the Last Year: Not on file  Transportation Needs:   . Lack of Transportation (Medical): Not on file  . Lack of Transportation (Non-Medical): Not on file  Physical Activity:   . Days of Exercise per Week: Not on file  . Minutes of Exercise per Session: Not on file  Stress:   . Feeling of Stress : Not on file  Social Connections:   . Frequency of Communication with Friends and Family: Not on file  . Frequency of Social Gatherings with Friends and Family: Not on file  . Attends Religious Services: Not on file  . Active Member of Clubs or Organizations: Not on file  . Attends Archivist Meetings: Not on file  . Marital Status: Not on file  Intimate Partner Violence:   . Fear of Current or Ex-Partner: Not on file  . Emotionally Abused: Not on file  . Physically Abused: Not on file  . Sexually Abused: Not on file    REVIEW OF SYSTEMS: Constitutional: No fevers, chills, or sweats, no generalized  fatigue, change in appetite Eyes: No visual changes, double vision, eye pain Ear, nose and throat: No hearing loss, ear pain, nasal congestion, sore throat Cardiovascular: No chest pain, palpitations Respiratory:  No shortness of breath at rest or with exertion, wheezes GastrointestinaI: No nausea, vomiting, diarrhea, abdominal pain, fecal incontinence Genitourinary:  No dysuria, urinary retention or frequency Musculoskeletal:  No neck pain, back pain Integumentary: No rash, pruritus, skin lesions Neurological: as above Psychiatric: No depression, insomnia, anxiety Endocrine: No palpitations, fatigue, diaphoresis, mood swings, change in appetite, change in weight, increased thirst Hematologic/Lymphatic:  No purpura, petechiae. Allergic/Immunologic: no itchy/runny eyes, nasal congestion, recent allergic reactions, rashes  PHYSICAL EXAM: Blood pressure (!) 165/91, pulse 72, height 5' 1.5" (1.562 m), weight 198 lb (89.8 kg), last menstrual period 12/22/2017, SpO2 98 %. General: No acute distress.  Patient appears well-groomed.   Head:  Normocephalic/atraumatic Eyes:  fundi examined but not visualized Neck: supple, no paraspinal tenderness, full range of motion Back: No paraspinal tenderness Heart: regular rate and rhythm Lungs: Clear to auscultation bilaterally. Vascular: No carotid bruits. Neurological Exam: Mental status: alert and oriented to person, place, and time, recent and remote memory intact, fund of knowledge intact, attention and concentration intact, speech fluent and not dysarthric, language intact. Cranial nerves: CN I: not tested CN II: pupils equal, round and reactive to light, visual fields intact CN III, IV, VI:  full range of motion, no nystagmus, no ptosis CN V: endorses mildly reduced right V1 to V3 sensory loss to touch CN VII: upper and lower face symmetric CN VIII: hearing intact CN IX, X: gag intact, uvula midline CN XI: sternocleidomastoid and trapezius  muscles intact CN XII: tongue midline Bulk & Tone: normal, no fasciculations.  She exhibits fluctuating dystonic movements of her ankles and feet. Motor:  5/5 upper extremities; 3+/5 bilateral hip flexion but with give-way weakness.  4+/5 distally.   Sensation:  Pinprick sensation reduced in feet bilaterally up to mid ankles; and vibration sensation intact.Marland Kitchen Deep  Tendon Reflexes:  2+ throughout except 3+ in patellars, toes downgoing. Finger to nose testing:  Without dysmetria.  Heel to shin:  Mild difficulty due to weakness. Gait:  Wide-based, ambulates with limp, requires holding on to wall to turn around.  She frequently reaches for the wall while walking to keep steady.  Tandem walking deferred.  Romberg positive.    IMPRESSION: Longstanding history of asymmetric diffuse weakness, sensory deficits, ataxia, polymyalgia and polyarthralgia.  History of chronic elevated ESR, recently more elevated than in the past with questionable diagnosis of lupus.  She has had an extensive neurologic workup by multiple neurologists in the past.  Workup for initial onset of numbness and weakness negative for CNS process (MRIs) or peripheral nervous system process such as a myopathy, neuromuscular junction disorder or inflammatory peripheral neuropathy (such as Guillain-Barre).  I have a strong suspicion that her symptoms are functional.  However, she endorses new symptoms (namely intermittent episodes of right sided hemisensory loss) which would only be explained by a CNS process.  She also endorses new urinary incontinence.  Therefore, I will check MRI of the neuro-axis.    PLAN: MRI neuro-axis (brain/cervical/thoracic/lumbar) Follow up after testing.  Thank you for allowing me to take part in the care of this patient.  Metta Clines, DO  CC:  Marilynne Drivers, [place]  Donald Prose, MD  ADDENDUM:  MRI of neuro-axis (brain/cervical/thoracic/lumbar) is unremarkable. I have no neurologic explanation for her weakness and  numbness.  Metta Clines, DO

## 2019-04-18 ENCOUNTER — Encounter: Payer: Self-pay | Admitting: Neurology

## 2019-04-18 ENCOUNTER — Ambulatory Visit (INDEPENDENT_AMBULATORY_CARE_PROVIDER_SITE_OTHER): Payer: No Typology Code available for payment source | Admitting: Neurology

## 2019-04-18 ENCOUNTER — Other Ambulatory Visit: Payer: Self-pay

## 2019-04-18 VITALS — BP 165/91 | HR 72 | Ht 61.5 in | Wt 198.0 lb

## 2019-04-18 DIAGNOSIS — R269 Unspecified abnormalities of gait and mobility: Secondary | ICD-10-CM | POA: Diagnosis not present

## 2019-04-18 DIAGNOSIS — M2550 Pain in unspecified joint: Secondary | ICD-10-CM

## 2019-04-18 DIAGNOSIS — R29898 Other symptoms and signs involving the musculoskeletal system: Secondary | ICD-10-CM

## 2019-04-18 DIAGNOSIS — R32 Unspecified urinary incontinence: Secondary | ICD-10-CM | POA: Diagnosis not present

## 2019-04-18 DIAGNOSIS — M353 Polymyalgia rheumatica: Secondary | ICD-10-CM

## 2019-04-18 DIAGNOSIS — R200 Anesthesia of skin: Secondary | ICD-10-CM | POA: Diagnosis not present

## 2019-04-18 NOTE — Patient Instructions (Addendum)
1.  We will check MRI of brain, cervical, thoracic and lumbar spine with and without contrast 2.  Further recommendations pending results.  We have sent a referral to [facility] for your MRI and they will call you directly to schedule your appointment. They are located at [place]. If you need to contact them directly please call [phone].

## 2019-05-18 ENCOUNTER — Other Ambulatory Visit: Payer: Self-pay

## 2019-05-18 ENCOUNTER — Ambulatory Visit
Admission: RE | Admit: 2019-05-18 | Discharge: 2019-05-18 | Disposition: A | Discharge status: Home / Self Care | Payer: No Typology Code available for payment source | Source: Ambulatory Visit | Attending: Neurology | Admitting: Neurology

## 2019-05-18 DIAGNOSIS — M353 Polymyalgia rheumatica: Secondary | ICD-10-CM

## 2019-05-18 DIAGNOSIS — R32 Unspecified urinary incontinence: Secondary | ICD-10-CM

## 2019-05-18 DIAGNOSIS — R269 Unspecified abnormalities of gait and mobility: Secondary | ICD-10-CM

## 2019-05-18 DIAGNOSIS — R29898 Other symptoms and signs involving the musculoskeletal system: Secondary | ICD-10-CM

## 2019-05-18 DIAGNOSIS — R200 Anesthesia of skin: Secondary | ICD-10-CM

## 2019-05-18 DIAGNOSIS — M2550 Pain in unspecified joint: Secondary | ICD-10-CM

## 2019-05-18 MED ORDER — GADOBENATE DIMEGLUMINE 529 MG/ML IV SOLN
18.0000 mL | Freq: Once | INTRAVENOUS | Status: AC | PRN
  Administered 2019-05-18: 15:00:00 18 mL via INTRAVENOUS

## 2019-05-22 ENCOUNTER — Other Ambulatory Visit: Payer: No Typology Code available for payment source

## 2019-06-21 ENCOUNTER — Other Ambulatory Visit: Payer: Self-pay

## 2019-06-21 ENCOUNTER — Ambulatory Visit
Admission: RE | Admit: 2019-06-21 | Discharge: 2019-06-21 | Disposition: A | Discharge status: Home / Self Care | Payer: 59 | Source: Ambulatory Visit | Attending: Neurology | Admitting: Neurology

## 2019-06-21 ENCOUNTER — Other Ambulatory Visit: Payer: Self-pay | Admitting: Neurology

## 2019-06-21 DIAGNOSIS — R32 Unspecified urinary incontinence: Secondary | ICD-10-CM

## 2019-06-21 DIAGNOSIS — R269 Unspecified abnormalities of gait and mobility: Secondary | ICD-10-CM

## 2019-06-21 DIAGNOSIS — M353 Polymyalgia rheumatica: Secondary | ICD-10-CM

## 2019-06-21 DIAGNOSIS — R29898 Other symptoms and signs involving the musculoskeletal system: Secondary | ICD-10-CM

## 2019-06-21 DIAGNOSIS — M2550 Pain in unspecified joint: Secondary | ICD-10-CM

## 2019-06-21 DIAGNOSIS — R200 Anesthesia of skin: Secondary | ICD-10-CM

## 2019-06-21 MED ORDER — GADOBENATE DIMEGLUMINE 529 MG/ML IV SOLN
19.0000 mL | Freq: Once | INTRAVENOUS | Status: AC | PRN
  Administered 2019-06-21: 19 mL via INTRAVENOUS

## 2019-07-01 ENCOUNTER — Ambulatory Visit: Payer: 59 | Attending: Internal Medicine

## 2019-07-01 DIAGNOSIS — Z23 Encounter for immunization: Secondary | ICD-10-CM

## 2019-07-01 NOTE — Progress Notes (Signed)
   Covid-19 Vaccination Clinic  Name:  [name]    MRN: [id number] DOB: Aug 06, 1970  07/01/2019  [name] was observed post Covid-19 immunization for 15 minutes without incident. She was provided with Vaccine Information Sheet and instruction to access the V-Safe system.   [name] was instructed to call 911 with any severe reactions post vaccine: Marland Kitchen Difficulty breathing  . Swelling of face and throat  . A fast heartbeat  . A bad rash all over body  . Dizziness and weakness   Immunizations Administered    Name Date Dose VIS Date Route   Pfizer COVID-19 Vaccine 07/01/2019  8:18 AM 0.3 mL 03/15/2019 Intramuscular   Manufacturer: [place]   Lot: [id number]   [place]: [id number]

## 2019-07-24 ENCOUNTER — Ambulatory Visit: Payer: 59 | Attending: Internal Medicine

## 2019-07-24 DIAGNOSIS — Z23 Encounter for immunization: Secondary | ICD-10-CM

## 2019-07-24 NOTE — Progress Notes (Signed)
   Covid-19 Vaccination Clinic  Name:  TIMOTEA ROUGHTON    MRN: [id number] DOB: [birth year]  07/24/2019  Ms. Rothrock was observed post Covid-19 immunization for 15 minutes without incident. She was provided with Vaccine Information Sheet and instruction to access the V-Safe system.   Ms. Meucci was instructed to call 911 with any severe reactions post vaccine: Marland Kitchen Difficulty breathing  . Swelling of face and throat  . A fast heartbeat  . A bad rash all over body  . Dizziness and weakness   Immunizations Administered    Name Date Dose VIS Date Route   Pfizer COVID-19 Vaccine 07/24/2019  8:59 AM 0.3 mL 05/29/2018 Intramuscular   Manufacturer: [place]   Lot: [id number]   [place]: [id number]

## 2020-01-07 ENCOUNTER — Other Ambulatory Visit: Payer: Self-pay | Admitting: Obstetrics and Gynecology

## 2020-01-07 DIAGNOSIS — Z1231 Encounter for screening mammogram for malignant neoplasm of breast: Secondary | ICD-10-CM

## 2020-01-09 ENCOUNTER — Telehealth: Payer: 59 | Admitting: Family Medicine

## 2020-01-28 ENCOUNTER — Telehealth: Payer: Self-pay | Admitting: Nurse Practitioner

## 2020-01-28 NOTE — Telephone Encounter (Signed)
I called Amy Barry to discuss Covid symptoms and the use of casirivimab/imdevimab, a monoclonal antibody infusion for those with mild to moderate Covid symptoms and at a high risk of hospitalization.    Pt does not qualify for infusion therapy as pt's symptoms first presented > 10 days prior to timing of infusion. Symptoms tier reviewed as well as criteria for ending isolation. Preventative practices reviewed. Patient verbalized understanding    Murray Hodgkins, NP

## 2020-04-16 ENCOUNTER — Other Ambulatory Visit: Payer: No Typology Code available for payment source

## 2020-04-16 DIAGNOSIS — Z20822 Contact with and (suspected) exposure to covid-19: Secondary | ICD-10-CM

## 2020-04-19 LAB — NOVEL CORONAVIRUS, NAA: SARS-CoV-2, NAA: NOT DETECTED

## 2020-06-02 DIAGNOSIS — I639 Cerebral infarction, unspecified: Secondary | ICD-10-CM

## 2020-06-02 HISTORY — DX: Cerebral infarction, unspecified: I63.9

## 2020-06-29 ENCOUNTER — Emergency Department (HOSPITAL_COMMUNITY): Payer: No Typology Code available for payment source

## 2020-06-29 ENCOUNTER — Inpatient Hospital Stay (HOSPITAL_COMMUNITY): Payer: No Typology Code available for payment source | Admitting: Certified Registered"

## 2020-06-29 ENCOUNTER — Inpatient Hospital Stay (HOSPITAL_COMMUNITY): Payer: No Typology Code available for payment source

## 2020-06-29 ENCOUNTER — Other Ambulatory Visit (HOSPITAL_COMMUNITY): Payer: No Typology Code available for payment source

## 2020-06-29 ENCOUNTER — Encounter (HOSPITAL_COMMUNITY): Payer: Self-pay | Admitting: Emergency Medicine

## 2020-06-29 ENCOUNTER — Other Ambulatory Visit: Payer: Self-pay

## 2020-06-29 ENCOUNTER — Inpatient Hospital Stay (HOSPITAL_COMMUNITY)
Admission: EM | Admit: 2020-06-29 | Discharge: 2020-07-09 | DRG: 024 | Disposition: A | Discharge status: Rehabilitation Facility | Payer: No Typology Code available for payment source | Attending: Neurosurgery | Admitting: Neurosurgery

## 2020-06-29 ENCOUNTER — Encounter (HOSPITAL_COMMUNITY)
Admission: EM | Disposition: A | Discharge status: Rehabilitation Facility | Payer: Self-pay | Source: Home / Self Care | Attending: Neurosurgery

## 2020-06-29 DIAGNOSIS — G9389 Other specified disorders of brain: Secondary | ICD-10-CM | POA: Diagnosis present

## 2020-06-29 DIAGNOSIS — Z87442 Personal history of urinary calculi: Secondary | ICD-10-CM | POA: Diagnosis not present

## 2020-06-29 DIAGNOSIS — Z981 Arthrodesis status: Secondary | ICD-10-CM

## 2020-06-29 DIAGNOSIS — G911 Obstructive hydrocephalus: Secondary | ICD-10-CM | POA: Diagnosis present

## 2020-06-29 DIAGNOSIS — I615 Nontraumatic intracerebral hemorrhage, intraventricular: Secondary | ICD-10-CM | POA: Diagnosis present

## 2020-06-29 DIAGNOSIS — I10 Essential (primary) hypertension: Secondary | ICD-10-CM | POA: Diagnosis present

## 2020-06-29 DIAGNOSIS — I69054 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: Secondary | ICD-10-CM | POA: Diagnosis present

## 2020-06-29 DIAGNOSIS — Z885 Allergy status to narcotic agent status: Secondary | ICD-10-CM | POA: Diagnosis not present

## 2020-06-29 DIAGNOSIS — Z90710 Acquired absence of both cervix and uterus: Secondary | ICD-10-CM

## 2020-06-29 DIAGNOSIS — E871 Hypo-osmolality and hyponatremia: Secondary | ICD-10-CM | POA: Diagnosis present

## 2020-06-29 DIAGNOSIS — R4189 Other symptoms and signs involving cognitive functions and awareness: Secondary | ICD-10-CM | POA: Diagnosis not present

## 2020-06-29 DIAGNOSIS — S066X0A Traumatic subarachnoid hemorrhage without loss of consciousness, initial encounter: Secondary | ICD-10-CM

## 2020-06-29 DIAGNOSIS — E876 Hypokalemia: Secondary | ICD-10-CM | POA: Diagnosis present

## 2020-06-29 DIAGNOSIS — M542 Cervicalgia: Secondary | ICD-10-CM | POA: Diagnosis not present

## 2020-06-29 DIAGNOSIS — R509 Fever, unspecified: Secondary | ICD-10-CM | POA: Diagnosis not present

## 2020-06-29 DIAGNOSIS — Z9889 Other specified postprocedural states: Secondary | ICD-10-CM | POA: Diagnosis not present

## 2020-06-29 DIAGNOSIS — R41844 Frontal lobe and executive function deficit: Secondary | ICD-10-CM | POA: Diagnosis present

## 2020-06-29 DIAGNOSIS — Z8249 Family history of ischemic heart disease and other diseases of the circulatory system: Secondary | ICD-10-CM

## 2020-06-29 DIAGNOSIS — M130 Polyarthritis, unspecified: Secondary | ICD-10-CM | POA: Diagnosis present

## 2020-06-29 DIAGNOSIS — M21371 Foot drop, right foot: Secondary | ICD-10-CM | POA: Diagnosis present

## 2020-06-29 DIAGNOSIS — R269 Unspecified abnormalities of gait and mobility: Secondary | ICD-10-CM | POA: Diagnosis not present

## 2020-06-29 DIAGNOSIS — I609 Nontraumatic subarachnoid hemorrhage, unspecified: Principal | ICD-10-CM | POA: Diagnosis present

## 2020-06-29 DIAGNOSIS — Z20822 Contact with and (suspected) exposure to covid-19: Secondary | ICD-10-CM | POA: Diagnosis present

## 2020-06-29 DIAGNOSIS — K589 Irritable bowel syndrome without diarrhea: Secondary | ICD-10-CM | POA: Diagnosis present

## 2020-06-29 DIAGNOSIS — R739 Hyperglycemia, unspecified: Secondary | ICD-10-CM | POA: Diagnosis present

## 2020-06-29 DIAGNOSIS — I67848 Other cerebrovascular vasospasm and vasoconstriction: Secondary | ICD-10-CM | POA: Diagnosis present

## 2020-06-29 DIAGNOSIS — Z888 Allergy status to other drugs, medicaments and biological substances status: Secondary | ICD-10-CM

## 2020-06-29 DIAGNOSIS — K219 Gastro-esophageal reflux disease without esophagitis: Secondary | ICD-10-CM | POA: Diagnosis present

## 2020-06-29 DIAGNOSIS — R195 Other fecal abnormalities: Secondary | ICD-10-CM | POA: Diagnosis not present

## 2020-06-29 DIAGNOSIS — G4459 Other complicated headache syndrome: Secondary | ICD-10-CM | POA: Diagnosis not present

## 2020-06-29 DIAGNOSIS — R011 Cardiac murmur, unspecified: Secondary | ICD-10-CM | POA: Diagnosis present

## 2020-06-29 DIAGNOSIS — E559 Vitamin D deficiency, unspecified: Secondary | ICD-10-CM | POA: Diagnosis present

## 2020-06-29 DIAGNOSIS — D509 Iron deficiency anemia, unspecified: Secondary | ICD-10-CM | POA: Diagnosis present

## 2020-06-29 DIAGNOSIS — Z79899 Other long term (current) drug therapy: Secondary | ICD-10-CM | POA: Diagnosis not present

## 2020-06-29 DIAGNOSIS — M329 Systemic lupus erythematosus, unspecified: Secondary | ICD-10-CM | POA: Diagnosis present

## 2020-06-29 DIAGNOSIS — R4182 Altered mental status, unspecified: Secondary | ICD-10-CM | POA: Diagnosis not present

## 2020-06-29 DIAGNOSIS — G919 Hydrocephalus, unspecified: Secondary | ICD-10-CM | POA: Diagnosis present

## 2020-06-29 DIAGNOSIS — M353 Polymyalgia rheumatica: Secondary | ICD-10-CM | POA: Diagnosis present

## 2020-06-29 DIAGNOSIS — R159 Full incontinence of feces: Secondary | ICD-10-CM | POA: Diagnosis present

## 2020-06-29 DIAGNOSIS — K580 Irritable bowel syndrome with diarrhea: Secondary | ICD-10-CM | POA: Diagnosis present

## 2020-06-29 DIAGNOSIS — G441 Vascular headache, not elsewhere classified: Secondary | ICD-10-CM | POA: Diagnosis not present

## 2020-06-29 DIAGNOSIS — Z823 Family history of stroke: Secondary | ICD-10-CM | POA: Diagnosis not present

## 2020-06-29 HISTORY — PX: IR ANGIO VERTEBRAL SEL VERTEBRAL BILAT MOD SED: IMG5369

## 2020-06-29 HISTORY — PX: IR ANGIO INTRA EXTRACRAN SEL INTERNAL CAROTID BILAT MOD SED: IMG5363

## 2020-06-29 HISTORY — PX: RADIOLOGY WITH ANESTHESIA: SHX6223

## 2020-06-29 LAB — CBC
HCT: 41.4 % (ref 36.0–46.0)
Hemoglobin: 14 g/dL (ref 12.0–15.0)
MCH: 32.3 pg (ref 26.0–34.0)
MCHC: 33.8 g/dL (ref 30.0–36.0)
MCV: 95.6 fL (ref 80.0–100.0)
Platelets: 225 10*3/uL (ref 150–400)
RBC: 4.33 MIL/uL (ref 3.87–5.11)
RDW: 11.9 % (ref 11.5–15.5)
WBC: 6.9 10*3/uL (ref 4.0–10.5)
nRBC: 0 % (ref 0.0–0.2)

## 2020-06-29 LAB — COMPREHENSIVE METABOLIC PANEL
ALT: 15 U/L (ref 0–44)
AST: 20 U/L (ref 15–41)
Albumin: 4 g/dL (ref 3.5–5.0)
Alkaline Phosphatase: 55 U/L (ref 38–126)
Anion gap: 9 (ref 5–15)
BUN: 10 mg/dL (ref 6–20)
CO2: 23 mmol/L (ref 22–32)
Calcium: 9.2 mg/dL (ref 8.9–10.3)
Chloride: 103 mmol/L (ref 98–111)
Creatinine, Ser: 0.84 mg/dL (ref 0.44–1.00)
GFR, Estimated: >60 mL/min (ref >60)
Glucose, Bld: 214 mg/dL — ABNORMAL HIGH (ref 70–99)
Potassium: 3.1 mmol/L — ABNORMAL LOW (ref 3.5–5.1)
Sodium: 135 mmol/L (ref 135–145)
Total Bilirubin: 0.3 mg/dL (ref 0.3–1.2)
Total Protein: 7.6 g/dL (ref 6.5–8.1)

## 2020-06-29 LAB — DIFFERENTIAL
Abs Immature Granulocytes: 0.02 10*3/uL (ref 0.00–0.07)
Basophils Absolute: 0 10*3/uL (ref 0.0–0.1)
Basophils Relative: 0 %
Eosinophils Absolute: 0.1 10*3/uL (ref 0.0–0.5)
Eosinophils Relative: 1 %
Immature Granulocytes: 0 %
Lymphocytes Relative: 32 %
Lymphs Abs: 2.2 10*3/uL (ref 0.7–4.0)
Monocytes Absolute: 0.4 10*3/uL (ref 0.1–1.0)
Monocytes Relative: 6 %
Neutro Abs: 4.2 10*3/uL (ref 1.7–7.7)
Neutrophils Relative %: 61 %

## 2020-06-29 LAB — SURGICAL PCR SCREEN
MRSA, PCR: NEGATIVE
Staphylococcus aureus: NEGATIVE

## 2020-06-29 LAB — I-STAT CHEM 8, ED
BUN: 13 mg/dL (ref 6–20)
Calcium, Ion: 1.13 mmol/L — ABNORMAL LOW (ref 1.15–1.40)
Chloride: 103 mmol/L (ref 98–111)
Creatinine, Ser: 0.7 mg/dL (ref 0.44–1.00)
Glucose, Bld: 217 mg/dL — ABNORMAL HIGH (ref 70–99)
HCT: 44 % (ref 36.0–46.0)
Hemoglobin: 15 g/dL (ref 12.0–15.0)
Potassium: 3.2 mmol/L — ABNORMAL LOW (ref 3.5–5.1)
Sodium: 139 mmol/L (ref 135–145)
TCO2: 23 mmol/L (ref 22–32)

## 2020-06-29 LAB — HIV ANTIBODY (ROUTINE TESTING W REFLEX): HIV Screen 4th Generation wRfx: NONREACTIVE

## 2020-06-29 LAB — RESP PANEL BY RT-PCR (FLU A&B, COVID) ARPGX2
Influenza A by PCR: NEGATIVE
Influenza B by PCR: NEGATIVE
SARS Coronavirus 2 by RT PCR: NEGATIVE

## 2020-06-29 LAB — I-STAT BETA HCG BLOOD, ED (MC, WL, AP ONLY): I-stat hCG, quantitative: <5 m[IU]/mL (ref <5)

## 2020-06-29 LAB — PROTIME-INR
INR: 1.2 (ref 0.8–1.2)
Prothrombin Time: 14.8 seconds (ref 11.4–15.2)

## 2020-06-29 LAB — CBG MONITORING, ED: Glucose-Capillary: 211 mg/dL — ABNORMAL HIGH (ref 70–99)

## 2020-06-29 LAB — APTT: aPTT: 25 seconds (ref 24–36)

## 2020-06-29 SURGERY — IR WITH ANESTHESIA
Anesthesia: Monitor Anesthesia Care

## 2020-06-29 MED ORDER — FENTANYL CITRATE (PF) 250 MCG/5ML IJ SOLN
INTRAMUSCULAR | Status: DC | PRN
  Administered 2020-06-29: 50 ug via INTRAVENOUS
  Administered 2020-06-29 (×2): 25 ug via INTRAVENOUS

## 2020-06-29 MED ORDER — HYDROMORPHONE HCL 1 MG/ML IJ SOLN
0.5000 mg | INTRAMUSCULAR | Status: DC | PRN
  Administered 2020-06-29 – 2020-07-09 (×26): 0.5 mg via INTRAVENOUS
  Filled 2020-06-29 (×27): qty 1

## 2020-06-29 MED ORDER — LEVETIRACETAM IN NACL 500 MG/100ML IV SOLN
500.0000 mg | Freq: Two times a day (BID) | INTRAVENOUS | Status: DC
  Administered 2020-06-29 – 2020-07-03 (×9): 500 mg via INTRAVENOUS
  Filled 2020-06-29 (×9): qty 100

## 2020-06-29 MED ORDER — PANTOPRAZOLE SODIUM 40 MG PO TBEC: 40.0000 mg | DELAYED_RELEASE_TABLET | Freq: Every day | ORAL | Status: DC | PRN

## 2020-06-29 MED ORDER — NIMODIPINE 6 MG/ML PO SOLN
60.0000 mg | ORAL | Status: DC
  Administered 2020-06-29 – 2020-07-04 (×21): 60 mg
  Filled 2020-06-29 (×22): qty 10

## 2020-06-29 MED ORDER — HYDROXYCHLOROQUINE SULFATE 200 MG PO TABS
200.0000 mg | ORAL_TABLET | Freq: Two times a day (BID) | ORAL | Status: DC
  Administered 2020-06-29 – 2020-07-02 (×7): 200 mg
  Filled 2020-06-29 (×8): qty 1

## 2020-06-29 MED ORDER — LIDOCAINE HCL 1 % IJ SOLN
INTRAMUSCULAR | Status: AC
  Filled 2020-06-29: qty 20

## 2020-06-29 MED ORDER — POTASSIUM CHLORIDE ER 10 MEQ PO TBCR
10.0000 meq | EXTENDED_RELEASE_TABLET | Freq: Every day | ORAL | Status: DC
  Filled 2020-06-29: qty 1

## 2020-06-29 MED ORDER — CLEVIDIPINE BUTYRATE 0.5 MG/ML IV EMUL
0.0000 mg/h | INTRAVENOUS | Status: DC
  Administered 2020-06-29: 8 mg/h via INTRAVENOUS
  Administered 2020-06-29: 10 mg/h via INTRAVENOUS
  Administered 2020-06-29: 8 mg/h via INTRAVENOUS
  Administered 2020-06-29: 1 mg/h via INTRAVENOUS
  Administered 2020-06-30: 8 mg/h via INTRAVENOUS
  Administered 2020-06-30: 10 mg/h via INTRAVENOUS
  Administered 2020-06-30: 7 mg/h via INTRAVENOUS
  Administered 2020-06-30: 8 mg/h via INTRAVENOUS
  Administered 2020-06-30 – 2020-07-01 (×2): 6 mg/h via INTRAVENOUS
  Filled 2020-06-29: qty 50
  Filled 2020-06-29: qty 100
  Filled 2020-06-29 (×5): qty 50
  Filled 2020-06-29: qty 100
  Filled 2020-06-29 (×2): qty 50
  Filled 2020-06-29: qty 100
  Filled 2020-06-29 (×3): qty 50

## 2020-06-29 MED ORDER — METHOTREXATE 2.5 MG PO TABS: 2.5000 mg | ORAL_TABLET | ORAL | Status: DC

## 2020-06-29 MED ORDER — FENTANYL CITRATE (PF) 100 MCG/2ML IJ SOLN
50.0000 ug | Freq: Once | INTRAMUSCULAR | Status: AC
  Administered 2020-06-29: 50 ug via INTRAVENOUS

## 2020-06-29 MED ORDER — ACETAMINOPHEN 325 MG PO TABS: 650.0000 mg | ORAL_TABLET | ORAL | Status: DC | PRN

## 2020-06-29 MED ORDER — ONDANSETRON HCL 4 MG/2ML IJ SOLN
4.0000 mg | Freq: Four times a day (QID) | INTRAMUSCULAR | Status: DC | PRN
  Administered 2020-06-29 – 2020-07-08 (×10): 4 mg via INTRAVENOUS
  Filled 2020-06-29 (×11): qty 2

## 2020-06-29 MED ORDER — METOPROLOL TARTRATE 50 MG PO TABS
100.0000 mg | ORAL_TABLET | Freq: Two times a day (BID) | ORAL | Status: DC
  Administered 2020-06-29 – 2020-07-02 (×7): 100 mg
  Filled 2020-06-29 (×7): qty 2

## 2020-06-29 MED ORDER — HYDROXYCHLOROQUINE SULFATE 200 MG PO TABS
200.0000 mg | ORAL_TABLET | Freq: Two times a day (BID) | ORAL | Status: DC
  Filled 2020-06-29 (×2): qty 1

## 2020-06-29 MED ORDER — LIDOCAINE HCL (PF) 1 % IJ SOLN
INTRAMUSCULAR | Status: AC | PRN
  Administered 2020-06-29: 30 mL

## 2020-06-29 MED ORDER — PANTOPRAZOLE SODIUM 40 MG PO TBEC: 80.0000 mg | DELAYED_RELEASE_TABLET | Freq: Every day | ORAL | Status: DC

## 2020-06-29 MED ORDER — FOLIC ACID 1 MG PO TABS
1.0000 mg | ORAL_TABLET | Freq: Every day | ORAL | Status: DC
  Administered 2020-06-30 – 2020-07-09 (×10): 1 mg
  Filled 2020-06-29 (×10): qty 1

## 2020-06-29 MED ORDER — ACETAMINOPHEN-CODEINE #3 300-30 MG PO TABS
1.0000 | ORAL_TABLET | ORAL | Status: DC | PRN
  Filled 2020-06-29: qty 2

## 2020-06-29 MED ORDER — DOCUSATE SODIUM 50 MG PO CAPS: 50.0000 mg | ORAL_CAPSULE | Freq: Every day | ORAL | Status: DC | PRN

## 2020-06-29 MED ORDER — FUROSEMIDE 40 MG PO TABS
40.0000 mg | ORAL_TABLET | Freq: Every day | ORAL | Status: DC
  Administered 2020-06-30 – 2020-07-02 (×3): 40 mg
  Filled 2020-06-29 (×3): qty 1

## 2020-06-29 MED ORDER — PANTOPRAZOLE SODIUM 40 MG PO PACK
40.0000 mg | PACK | Freq: Every day | ORAL | Status: DC
  Administered 2020-06-30 – 2020-07-02 (×3): 40 mg
  Filled 2020-06-29 (×4): qty 20

## 2020-06-29 MED ORDER — ONDANSETRON HCL 4 MG PO TABS: 4.0000 mg | ORAL_TABLET | Freq: Four times a day (QID) | ORAL | Status: DC | PRN

## 2020-06-29 MED ORDER — SODIUM CHLORIDE 0.9% FLUSH: 3.0000 mL | Freq: Once | INTRAVENOUS | Status: DC

## 2020-06-29 MED ORDER — MUPIROCIN 2 % EX OINT
1.0000 "application " | TOPICAL_OINTMENT | Freq: Two times a day (BID) | CUTANEOUS | Status: AC
  Administered 2020-06-29 – 2020-07-03 (×9): 1 via NASAL
  Filled 2020-06-29 (×3): qty 22

## 2020-06-29 MED ORDER — MIDAZOLAM HCL 2 MG/2ML IJ SOLN
INTRAMUSCULAR | Status: AC
  Filled 2020-06-29: qty 2

## 2020-06-29 MED ORDER — IOHEXOL 350 MG/ML SOLN
75.0000 mL | Freq: Once | INTRAVENOUS | Status: AC | PRN
  Administered 2020-06-29: 75 mL via INTRAVENOUS

## 2020-06-29 MED ORDER — FENTANYL CITRATE (PF) 100 MCG/2ML IJ SOLN
INTRAMUSCULAR | Status: AC
  Filled 2020-06-29: qty 2

## 2020-06-29 MED ORDER — IOHEXOL 300 MG/ML  SOLN
150.0000 mL | Freq: Once | INTRAMUSCULAR | Status: AC | PRN
  Administered 2020-06-29: 75 mL via INTRA_ARTERIAL

## 2020-06-29 MED ORDER — DOCUSATE SODIUM 100 MG PO CAPS: 100.0000 mg | ORAL_CAPSULE | Freq: Two times a day (BID) | ORAL | Status: DC

## 2020-06-29 MED ORDER — LABETALOL HCL 5 MG/ML IV SOLN
20.0000 mg | Freq: Once | INTRAVENOUS | Status: AC
  Administered 2020-06-29: 20 mg via INTRAVENOUS
  Filled 2020-06-29: qty 4

## 2020-06-29 MED ORDER — STROKE: EARLY STAGES OF RECOVERY BOOK: Freq: Once | Status: DC

## 2020-06-29 MED ORDER — METOPROLOL TARTRATE 50 MG PO TABS: 100.0000 mg | ORAL_TABLET | Freq: Two times a day (BID) | ORAL | Status: DC

## 2020-06-29 MED ORDER — PANTOPRAZOLE SODIUM 40 MG PO TBEC
40.0000 mg | DELAYED_RELEASE_TABLET | Freq: Every day | ORAL | Status: DC
  Administered 2020-07-03 – 2020-07-09 (×7): 40 mg via ORAL
  Filled 2020-06-29 (×7): qty 1

## 2020-06-29 MED ORDER — NIMODIPINE 30 MG PO CAPS
60.0000 mg | ORAL_CAPSULE | ORAL | Status: DC
  Administered 2020-07-02 – 2020-07-03 (×9): 60 mg via ORAL
  Filled 2020-06-29 (×10): qty 2

## 2020-06-29 MED ORDER — ONDANSETRON 4 MG PO TBDP: 4.0000 mg | ORAL_TABLET | Freq: Four times a day (QID) | ORAL | Status: DC | PRN

## 2020-06-29 MED ORDER — DOCUSATE SODIUM 50 MG/5ML PO LIQD
100.0000 mg | Freq: Two times a day (BID) | ORAL | Status: DC
  Administered 2020-06-29 – 2020-07-02 (×7): 100 mg
  Filled 2020-06-29 (×6): qty 10

## 2020-06-29 MED ORDER — SODIUM CHLORIDE 0.9 % IV SOLN: INTRAVENOUS | Status: DC

## 2020-06-29 MED ORDER — MIDAZOLAM HCL 2 MG/2ML IJ SOLN
2.0000 mg | Freq: Once | INTRAMUSCULAR | Status: AC
  Administered 2020-06-29: 2 mg via INTRAVENOUS

## 2020-06-29 MED ORDER — METHOTREXATE 2.5 MG PO TABS
15.0000 mg | ORAL_TABLET | ORAL | Status: DC
  Administered 2020-07-03: 5 mg via ORAL
  Filled 2020-06-29: qty 6

## 2020-06-29 MED ORDER — POTASSIUM CHLORIDE 20 MEQ/15ML (10%) PO SOLN
10.0000 meq | Freq: Every day | ORAL | Status: DC
  Administered 2020-06-30 – 2020-07-02 (×3): 10 meq
  Filled 2020-06-29 (×4): qty 15

## 2020-06-29 MED ORDER — MIDAZOLAM HCL 5 MG/5ML IJ SOLN
INTRAMUSCULAR | Status: DC | PRN
  Administered 2020-06-29 (×2): 1 mg via INTRAVENOUS

## 2020-06-29 MED ORDER — LACTATED RINGERS IV SOLN: INTRAVENOUS | Status: DC

## 2020-06-29 MED ORDER — ACETAMINOPHEN 160 MG/5ML PO SOLN
650.0000 mg | ORAL | Status: DC | PRN
  Administered 2020-06-29 – 2020-07-01 (×2): 650 mg
  Filled 2020-06-29 (×2): qty 20.3

## 2020-06-29 MED ORDER — ACETAMINOPHEN 650 MG RE SUPP: 650.0000 mg | RECTAL | Status: DC | PRN

## 2020-06-29 MED ORDER — FUROSEMIDE 40 MG PO TABS: 40.0000 mg | ORAL_TABLET | Freq: Every day | ORAL | Status: DC

## 2020-06-29 MED ORDER — FOLIC ACID 1 MG PO TABS: 1.0000 mg | ORAL_TABLET | Freq: Every day | ORAL | Status: DC

## 2020-06-29 NOTE — ED Notes (Signed)
Transported to CT scan

## 2020-06-29 NOTE — Progress Notes (Signed)
Video call with daughters completed this evening.

## 2020-06-29 NOTE — Brief Op Note (Signed)
  NEUROSURGERY BRIEF OPERATIVE  NOTE   PREOP DX: Subarachnoid Hemorrhage  POSTOP DX: Same  PROCEDURE: Diagnostic cerebral angiogram  SURGEON: Dr. Consuella Lose, MD  ANESTHESIA: IV Sedation with Local  EBL: Minimal  SPECIMENS: None  COMPLICATIONS: None  CONDITION: Stable to recovery  FINDINGS (Full report in [facility]): 1. No aneurysms, AVM, fistula, or dissection seen.   Consuella Lose, MD [facility]

## 2020-06-29 NOTE — Code Documentation (Signed)
Stroke Response Nurse Documentation Code Documentation  Amy Barry is a 50 y.o. female arriving to [facility] ED via [facility] on 3/28 with past medical hx of HTN, SLE. Code stroke was activated by EMS. Patient from home where she was LKW at 2200 and now complaining of slurred speech and HA. On No antithrombotic. Stroke team at the bedside on patient arrival. Labs drawn and patient cleared for CT by Dr. Christy Gentles. Patient to CT with team. NIHSS 15, see documentation for details and code stroke times. Patient with disoriented, not following commands, left gaze preference , bilateral arm weakness, bilateral leg weakness and Global aphasia  on exam. The following imaging was completed:  CTA Head and Neck. Patient is not a candidate for tPA due to hemorrhage. Bedside handoff with ED RN Mortimer Fries.

## 2020-06-29 NOTE — Evaluation (Signed)
Clinical/Bedside Swallow Evaluation Patient Details  Name: Amy Barry MRN: [id number] Date of Birth: 1970-06-18  Today's Date: 06/29/2020 Time: SLP Start Time (ACUTE ONLY): 0957 SLP Stop Time (ACUTE ONLY): 1004 SLP Time Calculation (min) (ACUTE ONLY): 7 min  Past Medical History:  Past Medical History:  Diagnosis Date  . Arthralgia   . Generalized weakness    bilateral upper and lower extremities,  walks with cane  . GERD (gastroesophageal reflux disease)   . Headache(784.0)   . Heart murmur    per pt "since childhood, no problems"  . History of kidney stones   . Hypertension    followed pcp  . IBS (irritable bowel syndrome)   . Iron deficiency anemia    12-06-2017 per pt recently had IV iron infusion July 2019  . Low vitamin D level   . Osteoarthritis   . PVC (premature ventricular contraction)    per pt had holter monitor  . SLE (systemic lupus erythematosus) ([place])    rheumotologist-- dr syed  . Wears glasses    Past Surgical History:  Past Surgical History:  Procedure Laterality Date  . CERVICAL DISC ARTHROPLASTY N/A 10/23/2015   Procedure: Cervical Disc Arthroplasty Cervical six-seven;  Surgeon: Eustace Moore, MD;  Location: [place] [facility];  Service: Neurosurgery;  Laterality: N/A;  . D & C HYSTEROSCOPY W/ RESECTION ENDOMETRIAL POLYP  10-13-2003    dr rivard @[facility]   . ESOPHAGOGASTRODUODENOSCOPY     for GERD  . LAPAROSCOPIC ABDOMINAL EXPLORATION  1996   dx ibs  . OVARIAN CYST SURGERY  age 50   x2 cyst  . ROBOTIC ASSISTED TOTAL HYSTERECTOMY N/A 12/13/2017   Procedure: XI ROBOTIC ASSISTED TOTAL HYSTERECTOMYWITH BILATERAL SALPINGECTOMY;  Surgeon: Christophe Louis, MD;  Location: [facility];  Service: Gynecology;  Laterality: N/A;  . TUBAL LIGATION Bilateral 06-01-2002   dr Mancel Bale @[facility]    PPTL   HPI:  Amy Barry is a 50 y.o. female with GERD, lupus, HTN, heart murmor who presented to the ED with altered mental status and incomprehensible speech. CT which revealed subarachnoid  hemorrhage in the posterior fossa with aneurysmal pattern.   Assessment / Plan / Recommendation Clinical Impression  Pt is lethargic this am exhibiting focused attention to name, followed several one step commands and consumed an ice chip following oral care.Pharyngeal swallow not observed with small volume. Learned from husband pt is having a cerebral arteriogram later today therefore did not administer additional po's. Continue oral care and therapist will continue intervention. SLP Visit Diagnosis: Dysphagia, unspecified (R13.10)    Aspiration Risk  Mild aspiration risk;Moderate aspiration risk    Diet Recommendation NPO        Other  Recommendations Oral Care Recommendations: Oral care QID   Follow up Recommendations Inpatient Rehab      Frequency and Duration min 2x/week  2 weeks       Prognosis Prognosis for Safe Diet Advancement: Good      Swallow Study   General HPI: Amy Barry is a 50 y.o. female with GERD, lupus, HTN, heart murmor who presented to the ED with altered mental status and incomprehensible speech. CT which revealed subarachnoid hemorrhage in the posterior fossa with aneurysmal pattern. Type of Study: Bedside Swallow Evaluation Previous Swallow Assessment: none Diet Prior to this Study: NPO Temperature Spikes Noted: No Respiratory Status: Room air History of Recent Intubation: No Behavior/Cognition: Lethargic/Drowsy;Cooperative;Requires cueing Oral Cavity Assessment:  (unable to fully view- candidias on tip tongue?) Oral Care Completed by SLP: Yes Oral  Cavity - Dentition: Adequate natural dentition Vision:  (suspect deficits) Self-Feeding Abilities: Total assist Patient Positioning: Upright in bed Baseline Vocal Quality: Low vocal intensity Volitional Cough:  (unable- decreased strength) Volitional Swallow: Unable to elicit    Oral/Motor/Sensory Function Overall Oral Motor/Sensory Function:  (need to assess further- generally weak)   Ice Chips  Ice chips: Impaired Presentation: Spoon Oral Phase Impairments: Reduced labial seal;Reduced lingual movement/coordination Oral Phase Functional Implications: Oral holding Pharyngeal Phase Impairments:  (did not observe swallow)   Thin Liquid Thin Liquid: Not tested    Nectar Thick Nectar Thick Liquid: Not tested   Honey Thick Honey Thick Liquid: Not tested   Puree Puree: Not tested   Solid     Solid: Not tested      Houston Siren 06/29/2020,10:42 AM   Orbie Pyo Colvin Caroli.Ed Risk analyst [phone] Office [phone]

## 2020-06-29 NOTE — Transfer of Care (Signed)
Immediate Anesthesia Transfer of Care Note  Patient: Amy Barry  Procedure(s) Performed: IR WITH ANESTHESIA (N/A )  Patient Location: ICU  Anesthesia Type:MAC  Level of Consciousness: sedated and responds to stimulation  Airway & Oxygen Therapy: Patient Spontanous Breathing  Post-op Assessment: Report given to RN and Post -op Vital signs reviewed and stable  Post vital signs: Reviewed and stable  Last Vitals:  Vitals Value Taken Time  BP 128/76 06/29/20 1415  Temp    Pulse 86 06/29/20 1418  Resp 12 06/29/20 1418  SpO2 100 % 06/29/20 1418  Vitals shown include unvalidated device data.  Last Pain:  Vitals:   06/29/20 0935  TempSrc:   PainSc: Asleep         Complications: No complications documented.

## 2020-06-29 NOTE — Sedation Documentation (Signed)
Right groin sheath removed. 5 Fr. Exoseal deployed. Quickclot applied

## 2020-06-29 NOTE — Evaluation (Addendum)
Speech Language Pathology Evaluation Patient Details Name: Amy Barry MRN: [id number] DOB: 11-Jul-1970 Today's Date: 06/29/2020 Time: 5456-2563 SLP Time Calculation (min) (ACUTE ONLY): 7 min  Problem List:  Patient Active Problem List   Diagnosis Date Noted  . Subarachnoid hemorrhage ([place]) 06/29/2020  . 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 S/P hysterectomy 12/13/2017  . Abnormal laboratory test 09/09/2016  . Gait abnormality 08/03/2016  . Weakness 08/03/2016  . Paresthesia 08/03/2016  . S/P cervical spinal fusion 10/23/2015  . Hypersomnia 12/08/2010  . 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  . EPIGASTRIC PAIN 10/13/2009  . IRRITABLE BOWEL SYNDROME 03/10/2009  . BACK PAIN, LUMBAR 07/01/2008  . COSTOCHONDRITIS 02/01/2008  . VIRAL URI 01/08/2008  . CHEST PAIN 01/08/2008  . 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 ARM PAIN 12/25/2007  . ANEMIA-NOS 10/02/2007  . Essential hypertension 10/02/2007  . GERD 10/02/2007  . POLYP, GALLBLADDER 10/02/2007  . UTERINE POLYP 10/02/2007  . OSTEOARTHRITIS 10/02/2007  . Dyskinesia 10/02/2007  . HEADACHE 10/02/2007  . PALPITATIONS 10/02/2007  . CARDIAC MURMUR, HX OF 10/02/2007  . NEPHROLITHIASIS, HX OF 10/02/2007   Past Medical History:  Past Medical History:  Diagnosis Date  . Arthralgia   . Generalized weakness    bilateral upper and lower extremities,  walks with cane  . GERD (gastroesophageal reflux disease)   . Headache(784.0)   . Heart murmur    per pt "since childhood, no problems"  . History of kidney stones   . Hypertension    followed pcp  . IBS (irritable bowel syndrome)   . Iron deficiency anemia    12-06-2017 per pt recently had IV iron infusion July 2019  . Low vitamin D level   . Osteoarthritis   . PVC (premature ventricular contraction)    per pt had holter monitor  . SLE (systemic lupus erythematosus) ([place])    rheumotologist-- dr syed  . Wears glasses    Past Surgical History:  Past Surgical History:   Procedure Laterality Date  . CERVICAL DISC ARTHROPLASTY N/A 10/23/2015   Procedure: Cervical Disc Arthroplasty Cervical six-seven;  Surgeon: Eustace Moore, MD;  Location: [place] [facility];  Service: Neurosurgery;  Laterality: N/A;  . D & C HYSTEROSCOPY W/ RESECTION ENDOMETRIAL POLYP  10-13-2003    dr rivard @[facility]   . ESOPHAGOGASTRODUODENOSCOPY     for GERD  . LAPAROSCOPIC ABDOMINAL EXPLORATION  1996   dx ibs  . OVARIAN CYST SURGERY  age 73   x2 cyst  . ROBOTIC ASSISTED TOTAL HYSTERECTOMY N/A 12/13/2017   Procedure: XI ROBOTIC ASSISTED TOTAL HYSTERECTOMYWITH BILATERAL SALPINGECTOMY;  Surgeon: Christophe Louis, MD;  Location: [facility];  Service: Gynecology;  Laterality: N/A;  . TUBAL LIGATION Bilateral 06-01-2002   dr Mancel Bale @[facility]    PPTL   HPI:  EBBIE SORENSON is a 50 y.o. female with GERD, lupus, HTN, heart murmor who presented to the ED with altered mental status and incomprehensible speech. CT which revealed subarachnoid hemorrhage the foramen magnum and prepontine  cisterns with intraventricular reflux into the lateral ventricles   Assessment / Plan / Recommendation Clinical Impression  Pt lethargic during assessment and demonstrated focused attenttion for several seconds. She followed one step commands for oral-motor exam but unable to sustain for further assessment of comprehension. Expressively she responded to questions with significant dysarthria, abnormal resonance and difficulty communicating needs (suspect her dysathria is impacting significantly her ability to communicate needs, however therapy will gain better understanding of expressive language during diagnostic treatment when more alert). She counted 1-5 with  cues to initiate and dysarthria; attempted to state husbands name however fell asleep.     SLP Assessment  SLP Recommendation/Assessment: Patient needs continued Speech Lanaguage Pathology Services SLP Visit Diagnosis: Cognitive communication deficit (R41.841);Dysarthria and anarthria  (R47.1)    Follow Up Recommendations  Inpatient Rehab    Frequency and Duration min 2x/week  2 weeks      SLP Evaluation Cognition  Overall Cognitive Status: Impaired/Different from baseline Arousal/Alertness: Lethargic Orientation Level: Oriented to person Attention: Sustained Sustained Attention: Impaired Sustained Attention Impairment: Verbal basic Memory:  (will assess further) Awareness: Impaired Awareness Impairment: Anticipatory impairment;Emergent impairment;Intellectual impairment Problem Solving:  (will assess when alertness improves) Safety/Judgment: Impaired       Comprehension  Auditory Comprehension Overall Auditory Comprehension: Other (comment) (functional for one step - limited) Interfering Components: Attention (alertness) Visual Recognition/Discrimination Discrimination: Not tested Reading Comprehension Reading Status:  (TBA)    Expression Expression Primary Mode of Expression: Verbal Verbal Expression Overall Verbal Expression: Impaired Initiation: No impairment Automatic Speech:  (counted 1-5) Level of Generative/Spontaneous Verbalization: Phrase Repetition:  (TBA) Naming:  (TBA) Pragmatics: Impairment Impairments: Eye contact Written Expression Written Expression:  (TBA)   Oral / Motor  Oral Motor/Sensory Function Overall Oral Motor/Sensory Function:  (need to assess further- generally weak) Motor Speech Overall Motor Speech: Impaired Respiration: Within functional limits Phonation: Low vocal intensity Resonance: Hypernasality Articulation: Impaired Level of Impairment: Word Intelligibility: Intelligibility reduced Word: 25-49% accurate Phrase: 0-24% accurate Motor Planning: Witnin functional limits   GO                    Houston Siren 06/29/2020, 11:02 AM  Orbie Pyo Colvin Caroli.Ed Risk analyst [phone] Office [phone]

## 2020-06-29 NOTE — Procedures (Signed)
Cortrak  Person Inserting Tube:  Maylon Peppers C, RD Tube Type:  Cortrak - 43 inches Tube Location:  Left nare Initial Placement:  Stomach Secured by: Bridle Technique Used to Measure Tube Placement:  Documented cm marking at nare/ corner of mouth Cortrak Secured At:  64 cm    Cortrak Tube Team Note:  Consult received to place a Cortrak feeding tube.   No x-ray is required. RN may begin using tube.    If the tube becomes dislodged please keep the tube and contact the Cortrak team at [email or web address] (password [email or web address]) for replacement.  If after hours and replacement cannot be delayed, place a NG tube and confirm placement with an abdominal x-ray.    Lockie Pares., RD, LDN, CNSC See AMiON for contact information

## 2020-06-29 NOTE — Progress Notes (Signed)
PT Cancellation Note  Patient Details Name: Amy Barry MRN: [id number] DOB: [birth year]   Cancelled Treatment:    Reason Eval/Treat Not Completed: Active bedrest order Pt with current bedrest orders and being transferred to ICU from ED. Will hold until pt medically appropriate.   Lou Miner, DPT  Acute Rehabilitation Services  Pager: [phone] Office: [phone]    Rudean Hitt 06/29/2020, 8:48 AM

## 2020-06-29 NOTE — ED Provider Notes (Signed)
[facility] EMERGENCY DEPARTMENT Provider Note   CSN: [id number] Arrival date & time: 06/29/20  0620     History Chief complaint - weakness  Level 5 caveat due to acuity of condition Amy Barry is a 50 y.o. female.  The history is provided by the EMS personnel.  Neurologic Problem This is a new problem. The problem occurs constantly. Nothing aggravates the symptoms. Nothing relieves the symptoms. She has tried nothing for the symptoms.  Patient with extensive history including hypertension, SLE, IBS presents with possible stroke.  Patient presents as a code stroke.  Patient is having difficulty speaking and weakness.  No other details known on arrival     Past Medical History:  Diagnosis Date  . Arthralgia   . Generalized weakness    bilateral upper and lower extremities,  walks with cane  . GERD (gastroesophageal reflux disease)   . Headache(784.0)   . Heart murmur    per pt "since childhood, no problems"  . History of kidney stones   . Hypertension    followed pcp  . IBS (irritable bowel syndrome)   . Iron deficiency anemia    12-06-2017 per pt recently had IV iron infusion July 2019  . Low vitamin D level   . Osteoarthritis   . PVC (premature ventricular contraction)    per pt had holter monitor  . SLE (systemic lupus erythematosus) ([place])    rheumotologist-- dr syed  . Wears glasses     Patient Active Problem List   Diagnosis Date Noted  . 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 S/P hysterectomy 12/13/2017  . Abnormal laboratory test 09/09/2016  . Gait abnormality 08/03/2016  . Weakness 08/03/2016  . Paresthesia 08/03/2016  . S/P cervical spinal fusion 10/23/2015  . Hypersomnia 12/08/2010  . 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  . EPIGASTRIC PAIN 10/13/2009  . IRRITABLE BOWEL SYNDROME 03/10/2009  . BACK PAIN, LUMBAR 07/01/2008  . COSTOCHONDRITIS 02/01/2008  . VIRAL URI 01/08/2008  . CHEST PAIN 01/08/2008  . OTHER SPECIFIED EPISODIC MOOD DISORDER  12/25/2007  . ARM PAIN 12/25/2007  . ANEMIA-NOS 10/02/2007  . Essential hypertension 10/02/2007  . GERD 10/02/2007  . POLYP, GALLBLADDER 10/02/2007  . UTERINE POLYP 10/02/2007  . OSTEOARTHRITIS 10/02/2007  . Dyskinesia 10/02/2007  . HEADACHE 10/02/2007  . PALPITATIONS 10/02/2007  . CARDIAC MURMUR, HX OF 10/02/2007  . NEPHROLITHIASIS, HX OF 10/02/2007    Past Surgical History:  Procedure Laterality Date  . CERVICAL DISC ARTHROPLASTY N/A 10/23/2015   Procedure: Cervical Disc Arthroplasty Cervical six-seven;  Surgeon: Eustace Moore, MD;  Location: [place] [facility] ORS;  Service: Neurosurgery;  Laterality: N/A;  . D & C HYSTEROSCOPY W/ RESECTION ENDOMETRIAL POLYP  10-13-2003    dr rivard @[facility]   . ESOPHAGOGASTRODUODENOSCOPY     for GERD  . LAPAROSCOPIC ABDOMINAL EXPLORATION  1996   dx ibs  . OVARIAN CYST SURGERY  age 69   x2 cyst  . ROBOTIC ASSISTED TOTAL HYSTERECTOMY N/A 12/13/2017   Procedure: XI ROBOTIC ASSISTED TOTAL HYSTERECTOMYWITH BILATERAL SALPINGECTOMY;  Surgeon: Christophe Louis, MD;  Location: [facility] ORS;  Service: Gynecology;  Laterality: N/A;  . TUBAL LIGATION Bilateral 06-01-2002   dr Mancel Bale @[facility]    PPTL     OB History   No obstetric history on file.     Family History  Problem Relation Age of Onset  . Arthritis Other   . Hyperlipidemia Other   . Hypertension Daughter   . Colon polyps Daughter 9       hermatoma oversized  polyps-benign  . Stroke Other   . Leukemia Maternal Grandmother   . Heart disease Maternal Grandmother   . Asthma Sister   . Hypertension Mother        hx aneursym and surgery  . Atrial fibrillation Mother   . Sarcoidosis Mother   . Healthy Father   . Colon cancer Neg Hx     Social History   Tobacco Use  . Smoking status: Never Smoker  . Smokeless tobacco: Never Used  Vaping Use  . Vaping Use: Never used  Substance Use Topics  . Alcohol use: No    Alcohol/week: 0.0 standard drinks  . Drug use: Never    Home Medications Prior to Admission  medications   Medication Sig Start Date End Date Taking? Authorizing Provider  cholecalciferol (VITAMIN D3) 25 MCG (1000 UT) tablet  02/28/19   [provider]  docusate sodium (COLACE) 50 MG capsule Take 50 mg by mouth daily as needed (for constipation.).    [provider]  folic acid (FOLVITE) 1 MG tablet Take 1 mg by mouth daily. 03/12/19   [provider]  furosemide (LASIX) 40 MG tablet Take 1 tablet (40 mg total) by mouth daily. 08/21/15   Laurey Morale, MD  gabapentin (NEURONTIN) 300 MG capsule Take 300 mg by mouth 3 (three) times daily as needed. 01/17/19   [provider]  hydroxychloroquine (PLAQUENIL) 200 MG tablet Take 200 mg by mouth 2 (two) times daily.  10/31/17   [provider]  ibuprofen (ADVIL,MOTRIN) 200 MG tablet Take 600-800 mg by mouth every 6 (six) hours as needed (for pain/fever).    [provider]  methotrexate 2.5 MG tablet     [provider]  metoprolol (LOPRESSOR) 100 MG tablet TAKE ONE TABLET BY MOUTH TWICE DAILY 12/31/14   Laurey Morale, MD  Multiple Vitamin (MULTIVITAMIN WITH MINERALS) TABS Take 1 tablet by mouth daily.     [provider]  omeprazole (PRILOSEC) 40 MG capsule Take 40 mg by mouth daily.    [provider]  ondansetron (ZOFRAN) 4 MG tablet Take 1 tablet (4 mg total) by mouth every 6 (six) hours as needed for nausea. 12/14/17   Christophe Louis, MD  pantoprazole (PROTONIX) 40 MG tablet Take 40 mg by mouth daily as needed (for acid reflux/indigestion).     [provider]  potassium chloride (K-DUR) 10 MEQ tablet TAKE ONE TABLET BY MOUTH ONCE DAILY 12/31/14   Laurey Morale, MD  TURMERIC PO Take 1 capsule by mouth daily.    [provider]    Allergies    Imitrex [sumatriptan], Demerol [meperidine], Oxycodone, Reglan [metoclopramide], Tramadol, Codeine, and Propoxyphene n-acetaminophen  Review of Systems   Review of Systems  Unable to perform ROS: Acuity of  condition    Physical Exam Updated Vital Signs LMP 12/22/2017   Physical Exam CONSTITUTIONAL: Well developed HEAD: Normocephalic/atraumatic EYES: EOMI/PERRL ENMT: Mucous membranes moist NECK: supple no meningeal signs CV: S1/S2 noted, no murmurs/rubs/gallops noted LUNGS: Lungs are clear to auscultation bilaterally, no apparent distress ABDOMEN: soft, nontender, nondistended NEURO: Pt is awake/alert, she is mumbling incoherently.  She will respond to pain and moves all extremities EXTREMITIES: pulses normal/equal, no deformities SKIN: warm, color normal PSYCH: Unable to assess  ED Results / Procedures / Treatments   Labs (all labs ordered are listed, but only abnormal results are displayed) Labs Reviewed  CBG MONITORING, ED - Abnormal; Notable for the following components:      Result  Value   Glucose-Capillary 211 (*)    All other components within normal limits  I-STAT CHEM 8, ED - Abnormal; Notable for the following components:   Potassium 3.2 (*)    Glucose, Bld 217 (*)    Calcium, Ion 1.13 (*)    All other components within normal limits  RESP PANEL BY RT-PCR (FLU A&B, COVID) ARPGX2  PROTIME-INR  APTT  CBC  DIFFERENTIAL  COMPREHENSIVE METABOLIC PANEL  HIV ANTIBODY (ROUTINE TESTING W REFLEX)  RAPID URINE DRUG SCREEN, HOSP PERFORMED  CBG MONITORING, ED  I-STAT BETA HCG BLOOD, ED ([facility], [facility], [facility] ONLY)    EKG EKG Interpretation  Date/Time:  Monday June 29 2020 06:48:17 EDT Ventricular Rate:  78 PR Interval:    QRS Duration: 80 QT Interval:  403 QTC Calculation: 459 R Axis:   72 Text Interpretation: Sinus rhythm Right atrial enlargement Borderline T wave abnormalities No significant change since last tracing Confirmed by Ripley Fraise [phone]) on 06/29/2020 6:50:37 AM   Radiology CT ANGIO HEAD W OR WO CONTRAST  Result Date: 06/29/2020 CLINICAL DATA:  Subarachnoid hemorrhage. EXAM: CT ANGIOGRAPHY HEAD AND NECK TECHNIQUE: Multidetector CT imaging of the head  and neck was performed using the standard protocol during bolus administration of intravenous contrast. Multiplanar CT image reconstructions and MIPs were obtained to evaluate the vascular anatomy. Carotid stenosis measurements (when applicable) are obtained utilizing NASCET criteria, using the distal internal carotid diameter as the denominator. CONTRAST:  51mL OMNIPAQUE IOHEXOL 350 MG/ML SOLN COMPARISON:  CTA of the head 06/19/2012 FINDINGS: CTA NECK FINDINGS Aortic arch: Normal.  Three vessel branching. Right carotid system: Vessels are smooth and widely patent. Left carotid system: Vessels are smooth and widely patent. Vertebral arteries: Vessels are smooth and widely patent. Skeleton: Disc arthroplasty at C6-7. Other neck: No evidence of inflammation or mass. Upper chest: Negative Review of the MIP images confirms the above findings CTA HEAD FINDINGS Anterior circulation: Mild calcification at the anterior genu of the right cavernous ICA. No branch occlusion, beading, or aneurysm. Some arteries are partially obscured by venous contamination. Posterior circulation: The vertebral and basilar arteries are smoothly contoured and widely patent. The left PICA origin is not well demonstrated and there is a subsequent tiny bulbous appearance, see 8:151. No visible vascular malformation or venous shunting. Venous sinuses: Diffusely patent Anatomic variants: None significant Review of the MIP images confirms the above findings IMPRESSION: No definite source of subarachnoid hemorrhage but there poor visualization of the proximal left PICA with a tiny bulbous area, question ruptured dissection. Electronically Signed   By: Monte Fantasia M.D.   On: 06/29/2020 07:08   CT ANGIO NECK W OR WO CONTRAST  Result Date: 06/29/2020 CLINICAL DATA:  Subarachnoid hemorrhage. EXAM: CT ANGIOGRAPHY HEAD AND NECK TECHNIQUE: Multidetector CT imaging of the head and neck was performed using the standard protocol during bolus  administration of intravenous contrast. Multiplanar CT image reconstructions and MIPs were obtained to evaluate the vascular anatomy. Carotid stenosis measurements (when applicable) are obtained utilizing NASCET criteria, using the distal internal carotid diameter as the denominator. CONTRAST:  82mL OMNIPAQUE IOHEXOL 350 MG/ML SOLN COMPARISON:  CTA of the head 06/19/2012 FINDINGS: CTA NECK FINDINGS Aortic arch: Normal.  Three vessel branching. Right carotid system: Vessels are smooth and widely patent. Left carotid system: Vessels are smooth and widely patent. Vertebral arteries: Vessels are smooth and widely patent. Skeleton: Disc arthroplasty at C6-7. Other neck: No evidence of inflammation or mass. Upper chest: Negative Review of the MIP images confirms  the above findings CTA HEAD FINDINGS Anterior circulation: Mild calcification at the anterior genu of the right cavernous ICA. No branch occlusion, beading, or aneurysm. Some arteries are partially obscured by venous contamination. Posterior circulation: The vertebral and basilar arteries are smoothly contoured and widely patent. The left PICA origin is not well demonstrated and there is a subsequent tiny bulbous appearance, see 8:151. No visible vascular malformation or venous shunting. Venous sinuses: Diffusely patent Anatomic variants: None significant Review of the MIP images confirms the above findings IMPRESSION: No definite source of subarachnoid hemorrhage but there poor visualization of the proximal left PICA with a tiny bulbous area, question ruptured dissection. Electronically Signed   By: Monte Fantasia M.D.   On: 06/29/2020 07:08   CT HEAD CODE STROKE WO CONTRAST  Result Date: 06/29/2020 CLINICAL DATA:  Code stroke.  Weakness and slurred speech. EXAM: CT HEAD WITHOUT CONTRAST TECHNIQUE: Contiguous axial images were obtained from the base of the skull through the vertex without intravenous contrast. COMPARISON:  Brain MRI 05/18/2019 FINDINGS:  Brain: Subarachnoid hemorrhage the foramen magnum and prepontine cisterns with intraventricular reflux into the lateral ventricles. Lateral ventriculomegaly. No visible infarct or mass. Vascular: Negative by noncontrast technique. Skull: No acute or traumatic finding Sinuses/Orbits: Negative Other: Critical Value/emergent results were called by telephone at the time of interpretation on 06/29/2020 at 6:36 am to provider Dr Rory Percy , who verbally acknowledged these results. 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 There is intraventricular reflux and ventriculomegaly. Electronically Signed   By: Monte Fantasia M.D.   On: 06/29/2020 06:53    Procedures .Critical Care Performed by: Ripley Fraise, MD Authorized by: Ripley Fraise, MD   Critical care provider statement:    Critical care time (minutes):  30   Critical care start time:  06/29/2020 6:20 AM   Critical care end time:  06/29/2020 6:50 AM   Critical care was necessary to treat or prevent imminent or life-threatening deterioration of the following conditions:  CNS failure or compromise   Critical care was time spent personally by me on the following activities:  Discussions with consultants, examination of patient, re-evaluation of patient's condition, review of old charts, ordering and review of radiographic studies, ordering and review of laboratory studies and pulse oximetry   I assumed direction of critical care for this patient from another provider in my specialty: no       Medications Ordered in ED Medications  sodium chloride flush (NS) 0.9 % injection 3 mL (3 mLs Intravenous Not Given 06/29/20 0656)  potassium chloride (KLOR-CON) CR tablet 10 mEq (has no administration in time range)  metoprolol tartrate (LOPRESSOR)  tablet 100 mg (has no administration in time range)  furosemide (LASIX) tablet 40 mg (has no administration in time range)  hydroxychloroquine (PLAQUENIL) tablet 200 mg (has no administration in time range)  pantoprazole (PROTONIX) EC tablet 40 mg (has no administration in time range)  docusate sodium (COLACE) capsule 50 mg (has no administration in time range)  pantoprazole (PROTONIX) EC tablet 80 mg (has no administration in time range)  ondansetron (ZOFRAN) tablet 4 mg (has no administration in time range)  folic acid (FOLVITE) tablet 1 mg (has no administration in time range)  methotrexate (RHEUMATREX) tablet 2.5 mg (has no administration in time range)   stroke: mapping our early stages of recovery book (has no  administration in time range)  0.9 %  sodium chloride infusion (has no administration in time range)  acetaminophen (TYLENOL) tablet 650 mg (has no administration in time range)    Or  acetaminophen (TYLENOL) 160 MG/5ML solution 650 mg (has no administration in time range)    Or  acetaminophen (TYLENOL) suppository 650 mg (has no administration in time range)  docusate sodium (COLACE) capsule 100 mg (has no administration in time range)  ondansetron (ZOFRAN-ODT) disintegrating tablet 4 mg (has no administration in time range)    Or  ondansetron (ZOFRAN) injection 4 mg (has no administration in time range)  niMODipine (NIMOTOP) capsule 60 mg (has no administration in time range)    Or  niMODipine (NYMALIZE) 6 MG/ML oral solution 60 mg (has no administration in time range)  pantoprazole (PROTONIX) EC tablet 40 mg (has no administration in time range)    Or  pantoprazole sodium (PROTONIX) 40 mg/20 mL oral suspension 40 mg (has no administration in time range)  acetaminophen-codeine (TYLENOL #3) 300-30 MG per tablet 1-2 tablet (has no administration in time range)  HYDROmorphone (DILAUDID) injection 0.5 mg (has no administration in time range)  levETIRAcetam (KEPPRA) IVPB 500  mg/100 mL premix (has no administration in time range)  labetalol (NORMODYNE) injection 20 mg (has no administration in time range)    And  clevidipine (CLEVIPREX) infusion 0.5 mg/mL (has no administration in time range)  iohexol (OMNIPAQUE) 350 MG/ML injection 75 mL (75 mLs Intravenous Contrast Given 06/29/20 6546)    ED Course  I have reviewed the triage vital signs and the nursing notes.  Pertinent labs & imaging results that were available during my care of the patient were reviewed by me and considered in my medical decision making (see chart for details).    MDM Rules/Calculators/A&P                          6:31 AM Patient seen on arrival as a code stroke.  Patient was seen with in conjunction with neurology Patient off to CT scan at this time 7:24 AM Patient awake and alert but still speaking nonsensically.  She does not follow commands.  CT head consistent with subarachnoid hemorrhage likely from aneurysmal bleed.  Discussed the case with neurology and neurosurgery.  Final Clinical Impression(s) / ED Diagnoses Final diagnoses:  SAH (subarachnoid hemorrhage) ([place])    Rx / DC Orders ED Discharge Orders    None       Ripley Fraise, MD 06/29/20 [phone]

## 2020-06-29 NOTE — Consult Note (Signed)
Neurology Consultation Reason for Consult: Altered mental status, weakness Referring Physician: Dr. Christy Gentles, EDP History obtained from: Chart review, EMS Chief complaint: Weakness, altered mental status  HPI: Amy Barry is a 50 y.o. female with a past medical history of generalized arthritis and myalgias and concern for functional weakness, SLE, irritable bowel, last known well at 10 PM yesterday when she went to bed and noted by family this morning to be appearing confused and mumbling incomprehensible words.  When EMS arrived, she was not very coherent in her speech.  They did not notice any focal weakness but she was weak generally all over and was not following commands. She was brought in as an acute code stroke LVO positive outside the window for IV TPA. On my examination, she was speaking incomprehensibly and repeating "bababa".  Not following commands. 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  LKW: 10 PM 06/28/2021 tpa given?: no, subarachnoid hemorrhage Premorbid modified Rankin scale (mRS): Unknown  ROS: Unable to ascertain due to patient's mentation  Past Medical History:  Diagnosis Date  . Arthralgia   . Generalized weakness    bilateral upper and lower extremities,  walks with cane  . GERD (gastroesophageal reflux disease)   . Headache(784.0)   . Heart murmur    per pt "since childhood, no problems"  . History of kidney stones   . Hypertension    followed pcp  . IBS (irritable bowel syndrome)   . Iron deficiency anemia    12-06-2017 per pt recently had IV iron infusion July 2019  . Low vitamin D level   . Osteoarthritis   . PVC (premature ventricular contraction)    per pt had holter monitor  . SLE (systemic lupus erythematosus) ([place])    rheumotologist-- dr syed  . Wears glasses     Family History  Problem Relation Age of Onset  . Arthritis Other   .  Hyperlipidemia Other   . Hypertension Daughter   . Colon polyps Daughter 9       hermatoma oversized polyps-benign  . Stroke Other   . Leukemia Maternal Grandmother   . Heart disease Maternal Grandmother   . Asthma Sister   . Hypertension Mother        hx aneursym and surgery  . Atrial fibrillation Mother   . Sarcoidosis Mother   . Healthy Father   . Colon cancer Neg Hx      Social History:   reports that she has never smoked. She has never used smokeless tobacco. She reports that she does not drink alcohol and does not use drugs.  Medications  Current Facility-Administered Medications:  .  sodium chloride flush (NS) 0.9 % injection 3 mL, 3 mL, Intravenous, Once, Ripley Fraise, MD  Current Outpatient Medications:  .  cholecalciferol (VITAMIN D3) 25 MCG (1000 UT) tablet, , Disp: , Rfl:  .  docusate sodium (COLACE) 50 MG capsule, Take 50 mg by mouth daily as needed (for constipation.)., Disp: , Rfl:  .  folic acid (FOLVITE) 1 MG tablet, Take 1 mg by mouth daily., Disp: , Rfl:  .  furosemide (LASIX) 40 MG tablet, Take 1 tablet (40 mg total) by mouth daily., Disp: 90 tablet, Rfl: 3 .  gabapentin (NEURONTIN) 300 MG capsule, Take 300 mg by mouth 3 (three) times daily as needed., Disp: , Rfl:  .  hydroxychloroquine (PLAQUENIL) 200 MG tablet, Take 200 mg by mouth  2 (two) times daily. , Disp: , Rfl: 1 .  ibuprofen (ADVIL,MOTRIN) 200 MG tablet, Take 600-800 mg by mouth every 6 (six) hours as needed (for pain/fever)., Disp: , Rfl:  .  methotrexate 2.5 MG tablet, , Disp: , Rfl:  .  metoprolol (LOPRESSOR) 100 MG tablet, TAKE ONE TABLET BY MOUTH TWICE DAILY, Disp: 180 tablet, Rfl: 3 .  Multiple Vitamin (MULTIVITAMIN WITH MINERALS) TABS, Take 1 tablet by mouth daily. , Disp: , Rfl:  .  omeprazole (PRILOSEC) 40 MG capsule, Take 40 mg by mouth daily., Disp: , Rfl:  .  ondansetron (ZOFRAN) 4 MG tablet, Take 1 tablet (4 mg total) by mouth every 6 (six) hours as needed for nausea., Disp: 20  tablet, Rfl: 0 .  pantoprazole (PROTONIX) 40 MG tablet, Take 40 mg by mouth daily as needed (for acid reflux/indigestion). , Disp: , Rfl:  .  potassium chloride (K-DUR) 10 MEQ tablet, TAKE ONE TABLET BY MOUTH ONCE DAILY, Disp: 90 tablet, Rfl: 3 .  TURMERIC PO, Take 1 capsule by mouth daily., Disp: , Rfl:    Exam: Current vital signs: BP (!) 163/87   Pulse 70   Resp (!) 22   LMP 12/22/2017   SpO2 99%  Vital signs in last 24 hours: Pulse Rate:  [70] 70 (03/28 0628) Resp:  [22] 22 (03/28 0628) BP: (163)/(87) 163/87 (03/28 0628) SpO2:  [99 %] 99 % (03/28 0628) General: Awake alert in no distress HEENT: Normocephalic atraumatic Lungs: Clear Cardiovascular: Regular rhythm Abdomen:Soft nondistended nontender Extremities warm well perfused Neurological exam Awake, alert, does not follow commands She is mumbling incomprehensible speech mostly words that are composed of bababa and dadada Does not follow any commands Cranial nerves: Pupils equal round reactive to light, has gaze to the left, does not blink to threat from either side, face appears symmetric. Motor exam: Spontaneously moving all extremities and to noxious stimulation withdrawals and grimaces equally in all extremities. Sensory exam: As above Stroke scale 1a Level of Conscious.: 0 1b LOC Questions: 2 1c LOC Commands: 2 2 Best Gaze: 1 3 Visual: 0 4 Facial Palsy: 0 5a Motor Arm - left: 2 5b Motor Arm - Right: 2 6a Motor Leg - Left: 2 6b Motor Leg - Right: 2 7 Limb Ataxia: 0 8 Sensory: 0 9 Best Language: 2 10 Dysarthria: 0 11 Extinct. and Inatten.: 0 TOTAL: 15  Hunt and Hess -2   Labs I have reviewed labs in epic and the results pertinent to this consultation are: BMP Latest Ref Rng & Units 06/29/2020 12/06/2017 08/04/2016  Glucose 70 - 99 mg/dL 217(H) 97 107(H)  BUN 6 - 20 mg/dL 13 12 11   Creatinine 0.44 - 1.00 mg/dL 0.70 0.67 0.85  Sodium 135 - 145 mmol/L 139 144 139  Potassium 3.5 - 5.1 mmol/L 3.2(L) 3.5 3.7   Chloride 98 - 111 mmol/L 103 107 106  CO2 22 - 32 mmol/L - 29 24  Calcium 8.9 - 10.3 mg/dL - 9.2 9.0    Imaging I have reviewed the images obtained:  CT-scan of the brain  IMPRESSION: Subarachnoid hemorrhage in the posterior fossa with aneurysmal pattern. There is intraventricular reflux and ventriculomegaly. 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  Assessment:  51 year old woman with extensive past medical history of multiple neurological complaints with concern for functional disorder, systemic lupus erythematosus, presenting for evaluation of altered mental status, garbled speech and headache with last known well at  10 PM when she went to bed yesterday. CT scan consistent with subarachnoid hemorrhage around the brainstem with intraventricular hemorrhage in the fourth ventricle causing obstructive hydrocephalus. 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  I discussed this over the phone with the neuroradiologist.  IMPRESSION Subarachnoid hemorrhage in the posterior fossa and an aneurysmal pattern Obstructive hydrocephalus  Recommendations: Blood pressure-systolic less than 163. No antiplatelets or anticoagulants PT INR Stat neurosurgical consultation-Dr. Kathyrn Sheriff is aware and will see the patient. Discussed with Dr. Christy Gentles in the ER  -- Amie Portland, MD Neurologist [facility] Pager: [phone]  CRITICAL CARE ATTESTATION Performed by: Amie Portland, MD Total critical care time:39  minutes Critical care time was exclusive of separately billable procedures and treating other patients and/or supervising APPs/Residents/Students Critical care was necessary to treat or prevent imminent or life-threatening deterioration due to [facility] This patient is critically ill and at significant risk for  neurological worsening and/or death and care requires constant monitoring. Critical care was time spent personally by me on the following activities: development of treatment plan with patient and/or surrogate as well as nursing, discussions with consultants, evaluation of patient's response to treatment, examination of patient, obtaining history from patient or surrogate, ordering and performing treatments and interventions, ordering and review of laboratory studies, ordering and review of radiographic studies, pulse oximetry, re-evaluation of patient's condition, participation in multidisciplinary rounds and medical decision making of high complexity in the care of this patient.

## 2020-06-29 NOTE — ED Triage Notes (Signed)
Patient arrived with EMS from home , LSN 2200 last night , family reported slurred speech /headache and left side gaze at 6 am this morning , code stroke activated , EDP and neurologist evaluated pt. at arrival and transported to CT scan.

## 2020-06-29 NOTE — H&P (Signed)
Chief Complaint   Chief Complaint  Patient presents with  . Code Stroke : Slurred Speech/Left Gaze    HPI   Consult requested by: Dr Rory Percy, Neurology Reason for consult: nontraumatic SAH  HPI: Amy Barry is a 50 y.o. female with multiple medical comorbidities below who presented to the ED with altered mental status. By report, patient was found this am by patient's daughter confused with incomprehensible speech. LKN 2200 yesterday. She underwent stat head CT which revealed SAH in possible aneurysmal pattern although CTA negative. I was called for further evaluation. Patient remains altered with incomprehensible speech. Husband at bedside. Unable to provide any history as he was at work. He does state patient's mom has a history of intracranial aneurysms. Nonsmoker. Not on blood thinning medications.  Patient Active Problem List   Diagnosis Date Noted  . Subarachnoid hemorrhage ([place]) 06/29/2020  . 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 S/P hysterectomy 12/13/2017  . Abnormal laboratory test 09/09/2016  . Gait abnormality 08/03/2016  . Weakness 08/03/2016  . Paresthesia 08/03/2016  . S/P cervical spinal fusion 10/23/2015  . Hypersomnia 12/08/2010  . 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  . EPIGASTRIC PAIN 10/13/2009  . IRRITABLE BOWEL SYNDROME 03/10/2009  . BACK PAIN, LUMBAR 07/01/2008  . COSTOCHONDRITIS 02/01/2008  . VIRAL URI 01/08/2008  . CHEST PAIN 01/08/2008  . 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 ARM PAIN 12/25/2007  . ANEMIA-NOS 10/02/2007  . Essential hypertension 10/02/2007  . GERD 10/02/2007  . POLYP, GALLBLADDER 10/02/2007  . UTERINE POLYP 10/02/2007  . OSTEOARTHRITIS 10/02/2007  . Dyskinesia 10/02/2007  . HEADACHE 10/02/2007  . PALPITATIONS 10/02/2007  . CARDIAC MURMUR, HX OF 10/02/2007  . NEPHROLITHIASIS, HX OF 10/02/2007    PMH: Past Medical History:  Diagnosis Date  . Arthralgia   . Generalized weakness    bilateral upper and lower  extremities,  walks with cane  . GERD (gastroesophageal reflux disease)   . Headache(784.0)   . Heart murmur    per pt "since childhood, no problems"  . History of kidney stones   . Hypertension    followed pcp  . IBS (irritable bowel syndrome)   . Iron deficiency anemia    12-06-2017 per pt recently had IV iron infusion July 2019  . Low vitamin D level   . Osteoarthritis   . PVC (premature ventricular contraction)    per pt had holter monitor  . SLE (systemic lupus erythematosus) ([place])    rheumotologist-- dr syed  . Wears glasses     PSH: Past Surgical History:  Procedure Laterality Date  . CERVICAL DISC ARTHROPLASTY N/A 10/23/2015   Procedure: Cervical Disc Arthroplasty Cervical six-seven;  Surgeon: Eustace Moore, MD;  Location: [facility];  Service: Neurosurgery;  Laterality: N/A;  . D & C HYSTEROSCOPY W/ RESECTION ENDOMETRIAL POLYP  10-13-2003    dr rivard @[facility]   . ESOPHAGOGASTRODUODENOSCOPY     for GERD  . LAPAROSCOPIC ABDOMINAL EXPLORATION  1996   dx ibs  . OVARIAN CYST SURGERY  age 80   x2 cyst  . ROBOTIC ASSISTED TOTAL HYSTERECTOMY N/A 12/13/2017   Procedure: XI ROBOTIC ASSISTED TOTAL HYSTERECTOMYWITH BILATERAL SALPINGECTOMY;  Surgeon: Christophe Louis, MD;  Location: [facility];  Service: Gynecology;  Laterality: N/A;  . TUBAL LIGATION Bilateral 06-01-2002   dr Mancel Bale @[facility]    PPTL    (Not in a hospital admission)   SH: Social History   Tobacco Use  . Smoking status: Never Smoker  . Smokeless tobacco: Never Used  Vaping  Use  . Vaping Use: Never used  Substance Use Topics  . Alcohol use: No    Alcohol/week: 0.0 standard drinks  . Drug use: Never    MEDS: Prior to Admission medications   Medication Sig Start Date End Date Taking? Authorizing Provider  cholecalciferol (VITAMIN D3) 25 MCG (1000 UT) tablet  02/28/19   [provider]  docusate sodium (COLACE) 50 MG capsule Take 50 mg by mouth daily as needed (for constipation.).    [provider]   folic acid (FOLVITE) 1 MG tablet Take 1 mg by mouth daily. 03/12/19   [provider]  furosemide (LASIX) 40 MG tablet Take 1 tablet (40 mg total) by mouth daily. 08/21/15   Laurey Morale, MD  gabapentin (NEURONTIN) 300 MG capsule Take 300 mg by mouth 3 (three) times daily as needed. 01/17/19   [provider]  hydroxychloroquine (PLAQUENIL) 200 MG tablet Take 200 mg by mouth 2 (two) times daily.  10/31/17   [provider]  ibuprofen (ADVIL,MOTRIN) 200 MG tablet Take 600-800 mg by mouth every 6 (six) hours as needed (for pain/fever).    [provider]  methotrexate 2.5 MG tablet     [provider]  metoprolol (LOPRESSOR) 100 MG tablet TAKE ONE TABLET BY MOUTH TWICE DAILY 12/31/14   Laurey Morale, MD  Multiple Vitamin (MULTIVITAMIN WITH MINERALS) TABS Take 1 tablet by mouth daily.     [provider]  omeprazole (PRILOSEC) 40 MG capsule Take 40 mg by mouth daily.    [provider]  ondansetron (ZOFRAN) 4 MG tablet Take 1 tablet (4 mg total) by mouth every 6 (six) hours as needed for nausea. 12/14/17   Christophe Louis, MD  pantoprazole (PROTONIX) 40 MG tablet Take 40 mg by mouth daily as needed (for acid reflux/indigestion).     [provider]  potassium chloride (K-DUR) 10 MEQ tablet TAKE ONE TABLET BY MOUTH ONCE DAILY 12/31/14   Laurey Morale, MD  TURMERIC PO Take 1 capsule by mouth daily.    [provider]    ALLERGY: Allergies  Allergen Reactions  . Imitrex [Sumatriptan] Other (See Comments)    High blood pressure  . Demerol [Meperidine] Nausea And Vomiting  . Oxycodone Other (See Comments)    DROWSY  . Reglan [Metoclopramide] Other (See Comments)    "shakiness"  . Tramadol Nausea Only  . Codeine Palpitations  . Propoxyphene N-Acetaminophen Nausea And Vomiting    Social History   Tobacco Use  . Smoking status: Never Smoker  . Smokeless tobacco: Never Used  Substance Use Topics  . Alcohol use: No     Alcohol/week: 0.0 standard drinks     Family History  Problem Relation Age of Onset  . Arthritis Other   . Hyperlipidemia Other   . Hypertension Daughter   . Colon polyps Daughter 9       hermatoma oversized polyps-benign  . Stroke Other   . Leukemia Maternal Grandmother   . Heart disease Maternal Grandmother   . Asthma Sister   . Hypertension Mother        hx aneursym and surgery  . Atrial fibrillation Mother   . Sarcoidosis Mother   . Healthy Father   . Colon cancer Neg Hx      ROS   ROS incomprehensible speech  Exam   Vitals:   06/29/20 0700 06/29/20 0715  BP: 131/67 (!) 144/85  Pulse: 71 77  Resp: 20 (!) 23  Temp:  SpO2: 99% 100%   General appearance: drowsy but easily awakened GCS: E3V2M5 Eyes: No scleral injection Cardiovascular: Regular rate and rhythm without murmurs, rubs, gallops. No edema or variciosities. Distal pulses normal. Pulmonary: Effort normal, non-labored breathing Musculoskeletal:     Muscle tone upper extremities: Normal    Muscle tone lower extremities: Normal    Motor exam: does not follow commands. Does move all extremities to pain Neurological Mental Status: Patient is drowsy but easily awakaned Cranial Nerves: difficult to assess. Pupils reactive. +gaze preference No obvious facial assymetry.   Results - Imaging/Labs   Results for orders placed or performed during the hospital encounter of 06/29/20 (from the past 48 hour(s))  CBG monitoring, ED     Status: Abnormal   Collection Time: 06/29/20  6:22 AM  Result Value Ref Range   Glucose-Capillary 211 (H) 70 - 99 mg/dL    Comment: Glucose reference range applies only to samples taken after fasting for at least 8 hours.  I-Stat beta hCG blood, ED     Status: None   Collection Time: 06/29/20  6:50 AM  Result Value Ref Range   I-stat hCG, quantitative <5.0 <5 mIU/mL   Comment 3            Comment:   GEST. AGE      CONC.  (mIU/mL)   <=1 WEEK        5 - 50     2 WEEKS       50 -  500     3 WEEKS       100 - 10,000     4 WEEKS     1,000 - 30,000        FEMALE AND NON-PREGNANT FEMALE:     LESS THAN 5 mIU/mL   I-stat chem 8, ED     Status: Abnormal   Collection Time: 06/29/20  6:51 AM  Result Value Ref Range   Sodium 139 135 - 145 mmol/L   Potassium 3.2 (L) 3.5 - 5.1 mmol/L   Chloride 103 98 - 111 mmol/L   BUN 13 6 - 20 mg/dL   Creatinine, Ser 0.70 0.44 - 1.00 mg/dL   Glucose, Bld 217 (H) 70 - 99 mg/dL    Comment: Glucose reference range applies only to samples taken after fasting for at least 8 hours.   Calcium, Ion 1.13 (L) 1.15 - 1.40 mmol/L   TCO2 23 22 - 32 mmol/L   Hemoglobin 15.0 12.0 - 15.0 g/dL   HCT 44.0 36.0 - 46.0 %    CT ANGIO HEAD W OR WO CONTRAST  Result Date: 06/29/2020 CLINICAL DATA:  Subarachnoid hemorrhage. EXAM: CT ANGIOGRAPHY HEAD AND NECK TECHNIQUE: Multidetector CT imaging of the head and neck was performed using the standard protocol during bolus administration of intravenous contrast. Multiplanar CT image reconstructions and MIPs were obtained to evaluate the vascular anatomy. Carotid stenosis measurements (when applicable) are obtained utilizing NASCET criteria, using the distal internal carotid diameter as the denominator. CONTRAST:  96mL OMNIPAQUE IOHEXOL 350 MG/ML SOLN COMPARISON:  CTA of the head 06/19/2012 FINDINGS: CTA NECK FINDINGS Aortic arch: Normal.  Three vessel branching. Right carotid system: Vessels are smooth and widely patent. Left carotid system: Vessels are smooth and widely patent. Vertebral arteries: Vessels are smooth and widely patent. Skeleton: Disc arthroplasty at C6-7. Other neck: No evidence of inflammation or mass. Upper chest: Negative Review of the MIP images confirms the above findings CTA HEAD FINDINGS Anterior circulation: Mild calcification at the  anterior genu of the right cavernous ICA. No branch occlusion, beading, or aneurysm. Some arteries are partially obscured by venous contamination. Posterior circulation:  The vertebral and basilar arteries are smoothly contoured and widely patent. The left PICA origin is not well demonstrated and there is a subsequent tiny bulbous appearance, see 8:151. No visible vascular malformation or venous shunting. Venous sinuses: Diffusely patent Anatomic variants: None significant Review of the MIP images confirms the above findings IMPRESSION: No definite source of subarachnoid hemorrhage but there poor visualization of the proximal left PICA with a tiny bulbous area, question ruptured dissection. Electronically Signed   By: Monte Fantasia M.D.   On: 06/29/2020 07:08   CT ANGIO NECK W OR WO CONTRAST  Result Date: 06/29/2020 CLINICAL DATA:  Subarachnoid hemorrhage. EXAM: CT ANGIOGRAPHY HEAD AND NECK TECHNIQUE: Multidetector CT imaging of the head and neck was performed using the standard protocol during bolus administration of intravenous contrast. Multiplanar CT image reconstructions and MIPs were obtained to evaluate the vascular anatomy. Carotid stenosis measurements (when applicable) are obtained utilizing NASCET criteria, using the distal internal carotid diameter as the denominator. CONTRAST:  70mL OMNIPAQUE IOHEXOL 350 MG/ML SOLN COMPARISON:  CTA of the head 06/19/2012 FINDINGS: CTA NECK FINDINGS Aortic arch: Normal.  Three vessel branching. Right carotid system: Vessels are smooth and widely patent. Left carotid system: Vessels are smooth and widely patent. Vertebral arteries: Vessels are smooth and widely patent. Skeleton: Disc arthroplasty at C6-7. Other neck: No evidence of inflammation or mass. Upper chest: Negative Review of the MIP images confirms the above findings CTA HEAD FINDINGS Anterior circulation: Mild calcification at the anterior genu of the right cavernous ICA. No branch occlusion, beading, or aneurysm. Some arteries are partially obscured by venous contamination. Posterior circulation: The vertebral and basilar arteries are smoothly contoured and widely patent.  The left PICA origin is not well demonstrated and there is a subsequent tiny bulbous appearance, see 8:151. No visible vascular malformation or venous shunting. Venous sinuses: Diffusely patent Anatomic variants: None significant Review of the MIP images confirms the above findings IMPRESSION: No definite source of subarachnoid hemorrhage but there poor visualization of the proximal left PICA with a tiny bulbous area, question ruptured dissection. Electronically Signed   By: Monte Fantasia M.D.   On: 06/29/2020 07:08   CT HEAD CODE STROKE WO CONTRAST  Result Date: 06/29/2020 CLINICAL DATA:  Code stroke.  Weakness and slurred speech. EXAM: CT HEAD WITHOUT CONTRAST TECHNIQUE: Contiguous axial images were obtained from the base of the skull through the vertex without intravenous contrast. COMPARISON:  Brain MRI 05/18/2019 FINDINGS: Brain: Subarachnoid hemorrhage the foramen magnum and prepontine cisterns with intraventricular reflux into the lateral ventricles. Lateral ventriculomegaly. No visible infarct or mass. Vascular: Negative by noncontrast technique. Skull: No acute or traumatic finding Sinuses/Orbits: Negative Other: Critical Value/emergent results were called by telephone at the time of interpretation on 06/29/2020 at 6:36 am to provider Dr Rory Percy , who verbally acknowledged these results. 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 There is intraventricular reflux and ventriculomegaly. Electronically Signed   By: Monte Fantasia M.D.   On: 06/29/2020 06:53   Impression/Plan   50 y.o. female [facility] 2 HH3 CTA negative SAH. She is drowsy but easily awakened. While she does have some ventriculomegaly, I do not believe she needs any emergent NS intervention at present.  Will admit to 4N ICU for  further workup and management.   She will need to undergo formal catheter angiogram with Dr Kathyrn Sheriff for further evaluation today and appropriate treatment of aneurysm should one be identified. - Will start on nimotop, keppra - SLP/OT/PT - TCDs M/W/Fr - SBP goal <140  Discussed with husband at bedside who states understanding.  Ferne Reus, PA-C [facility] [employer]

## 2020-06-29 NOTE — Anesthesia Preprocedure Evaluation (Signed)
Anesthesia Evaluation  Patient identified by MRN, date of birth, ID band Patient confused    Reviewed: Allergy & Precautions, H&P , NPO status , Patient's Chart, lab work & pertinent test results, reviewed documented beta blocker date and time   Airway Mallampati: II  TM Distance: >3 FB Neck ROM: Full    Dental no notable dental hx. (+) Teeth Intact, Dental Advisory Given   Pulmonary neg pulmonary ROS,    Pulmonary exam normal breath sounds clear to auscultation       Cardiovascular hypertension, Pt. on medications and Pt. on home beta blockers  Rhythm:Regular Rate:Normal     Neuro/Psych  Headaches, negative psych ROS   GI/Hepatic Neg liver ROS, GERD  ,  Endo/Other  negative endocrine ROS  Renal/GU negative Renal ROS  negative genitourinary   Musculoskeletal  (+) Arthritis , Osteoarthritis,    Abdominal   Peds  Hematology  (+) Blood dyscrasia, anemia ,   Anesthesia Other Findings   Reproductive/Obstetrics negative OB ROS                             Anesthesia Physical Anesthesia Plan  ASA: III  Anesthesia Plan: MAC   Post-op Pain Management:    Induction: Intravenous  PONV Risk Score and Plan: 2 and Midazolam  Airway Management Planned: Simple Face Mask and Nasal Cannula  Additional Equipment:   Intra-op Plan:   Post-operative Plan:   Informed Consent: I have reviewed the patients History and Physical, chart, labs and discussed the procedure including the risks, benefits and alternatives for the proposed anesthesia with the patient or authorized representative who has indicated his/her understanding and acceptance.     Dental advisory given  Plan Discussed with: CRNA  Anesthesia Plan Comments:         Anesthesia Quick Evaluation

## 2020-06-29 NOTE — Procedures (Signed)
PREOP DX: Hydrocephalus  POSTOP DX: Same  PROCEDURE: Right frontal ventriculostomy   SURGEON: Dr. Consuella Lose, MD  ANESTHESIA: IV Sedation (versed and fentanyl) with Local  EBL: Minimal  SPECIMENS: None  COMPLICATIONS: None  CONDITION: Hemodynamically stable  INDICATIONS: Mrs. Amy Barry is a 50 y.o. female presenting with subarachnoid hemorrhage. Initial angiogram was negative however follow-up CT scan demonstrated progressive hydrocephalus. EVD was therefore indicated. I reviewed the indications for the procedure and the associated risks with the patient's husband and mother. All their questions were answered and her husband provided consent to proceed.  PROCEDURE IN DETAIL: Timeout was conducted. Skin of the right frontal scalp was clipped, prepped and draped in the usual sterile fashion.  Scalp was then infiltrated with local anesthetic with epinephrine.  Skin incision was made sharply, and twist drill burr hole was made.  The dura was then incised, and the ventricular catheter was passed in a single attempt into the right lateral ventricle.  Good CSF flow was obtained.  The catheter was then tunneled subcutaneously and connected to a drainage system and the skin incision closed.  The drain was then secured in place.  FINDINGS: 1. Opening pressure >20cmH2O 2. Blood tinged clear CSF   Consuella Lose, MD [facility]

## 2020-06-29 NOTE — Sedation Documentation (Signed)
Pt to CT scan.

## 2020-06-29 NOTE — ED Notes (Signed)
Neuro surgery at bedside.

## 2020-06-30 ENCOUNTER — Inpatient Hospital Stay (HOSPITAL_COMMUNITY): Payer: No Typology Code available for payment source

## 2020-06-30 ENCOUNTER — Encounter (HOSPITAL_COMMUNITY): Payer: Self-pay | Admitting: Radiology

## 2020-06-30 ENCOUNTER — Other Ambulatory Visit (HOSPITAL_COMMUNITY): Payer: No Typology Code available for payment source

## 2020-06-30 DIAGNOSIS — I609 Nontraumatic subarachnoid hemorrhage, unspecified: Secondary | ICD-10-CM | POA: Diagnosis not present

## 2020-06-30 DIAGNOSIS — M542 Cervicalgia: Secondary | ICD-10-CM

## 2020-06-30 DIAGNOSIS — G4459 Other complicated headache syndrome: Secondary | ICD-10-CM | POA: Diagnosis not present

## 2020-06-30 DIAGNOSIS — Z9889 Other specified postprocedural states: Secondary | ICD-10-CM | POA: Diagnosis not present

## 2020-06-30 LAB — TRIGLYCERIDES: Triglycerides: 66 mg/dL (ref <150)

## 2020-06-30 MED ORDER — CYCLOBENZAPRINE HCL 10 MG PO TABS
5.0000 mg | ORAL_TABLET | Freq: Three times a day (TID) | ORAL | Status: DC | PRN
  Administered 2020-06-30: 10 mg via NASOGASTRIC
  Filled 2020-06-30 (×2): qty 1

## 2020-06-30 MED ORDER — LIDOCAINE 5 % EX PTCH
2.0000 | MEDICATED_PATCH | CUTANEOUS | Status: DC
  Administered 2020-06-30 – 2020-07-08 (×9): 2 via TRANSDERMAL
  Filled 2020-06-30 (×10): qty 2

## 2020-06-30 MED ORDER — CHLORHEXIDINE GLUCONATE CLOTH 2 % EX PADS
6.0000 | MEDICATED_PAD | Freq: Every day | CUTANEOUS | Status: DC
  Administered 2020-07-01 – 2020-07-09 (×9): 6 via TOPICAL

## 2020-06-30 MED ORDER — ACETAMINOPHEN-CODEINE #3 300-30 MG PO TABS
1.0000 | ORAL_TABLET | ORAL | Status: DC | PRN
  Administered 2020-06-30 – 2020-07-03 (×10): 2 via NASOGASTRIC
  Filled 2020-06-30 (×9): qty 2

## 2020-06-30 NOTE — Progress Notes (Signed)
Inpatient Rehab Admissions Coordinator Note:   Per therapy recommendations, pt was screened for CIR candidacy by Shann Medal, PT, DPT.  At this time we are recommending a CIR consult and I will place an order per our protocol.  Please contact me with questions.   Shann Medal, PT, DPT [phone] 06/30/20 10:58 AM

## 2020-06-30 NOTE — Progress Notes (Signed)
  NEUROSURGERY PROGRESS NOTE   No issues overnight.   EXAM:  BP (!) 141/73   Pulse 86   Temp 98.5 F (36.9 C) (Oral)   Resp 14   Ht 5\' 4"  (1.626 m)   Wt 85.7 kg   LMP 12/22/2017   SpO2 100%   BMI 32.43 kg/m   Awake, alert, oriented  Speech fluent, appropriate  CN grossly intact  5/5 BUE/BLE  EVD functional. Bloody/CSF  IMPRESSION/PLAN 50 y.o. female angio negative SAH D#2, s/p EVD for hydrocephalus. Much improved this am. - continue supportive care, nimotop, keppra, therapy - continue EVD - TCDs for monitoring

## 2020-06-30 NOTE — Evaluation (Signed)
Occupational Therapy Evaluation Patient Details Name: Amy Barry MRN: [id number] DOB: Nov 07, 1970 Today's Date: 06/30/2020    History of Present Illness 50 y.o. female admitted with altered mental status and incomprehensible speech. CT which revealed subarachnoid hemorrhage in the posterior fossa with aneurysmal pattern. 3/28 R frontal Ventriculostomy PMH: GERD, lupus, HTN, heart murmor Cervical fusion IBS OA   Clinical Impression   Patient is s/p R venti surgery resulting in functional limitations due to the deficits listed below (see OT problem list). 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 Pt currently requires total +2 min (A) for basic transfer. Pt reports workup on going for several years for lupus dx and periods that require use of DME. Pt reports prior to admission indep for all transfers.  Patient will benefit from skilled OT acutely to increase independence and safety with ADLS to allow discharge CIR.     Follow Up Recommendations  CIR    Equipment Recommendations  3 in 1 bedside commode    Recommendations for Other Services Rehab consult     Precautions / Restrictions Precautions Precautions: Fall;Other (comment) Precaution Comments: EVD, clamp prior to mobility; SBP < 140 Restrictions Weight Bearing Restrictions: No      Mobility Bed Mobility Overal bed mobility: Needs Assistance Bed Mobility: Supine to Sit     Supine to sit: Mod assist;+2 for physical assistance     General bed mobility comments: Pt bringing BLE's off edge of bed with cues, assist for trunk to upright with head support    Transfers Overall transfer level: Needs assistance Equipment used: None Transfers: Sit to/from [facility] Sit to Stand: Min assist;+2 physical assistance Stand pivot transfers: Min assist;+2 physical assistance       General transfer comment: MinA + 2 for stand pivot transfer towards right, guidance for hips over to chair, head  support provided. No knee buckle noted    Balance Overall balance assessment: Needs assistance Sitting-balance support: Bilateral upper extremity supported;Feet supported Sitting balance-Leahy Scale: Poor Sitting balance - Comments: reports head is heavy and reliance on therapists for support for comfort. pt is able to engage core Postural control: Posterior lean                                 ADL either performed or assessed with clinical judgement   ADL Overall ADL's : Needs assistance/impaired Eating/Feeding: Minimal assistance;Sitting   Grooming: Wash/dry face;Minimal assistance;Sitting   Upper Body Bathing: Moderate assistance   Lower Body Bathing: Maximal assistance       Lower Body Dressing: Maximal assistance Lower Body Dressing Details (indicate cue type and reason): can figure 4 cross bil LE but limited by pain in head at this time. Therapist total (A) Toilet Transfer: +2 for physical assistance;Minimal assistance Toilet Transfer Details (indicate cue type and reason): simulated EOB to chair. Pt is only 5'1" so bed is elevated for her to basic transfer to chair           General ADL Comments: pt HA and reports pain at neck. pt progressed to sitting with support of trunk and neck. pt states "my head feels heavy"     Vision Baseline Vision/History: Wears glasses Wears Glasses: At all times Additional Comments: no reports of changes but difficult to fully assess due to no glasses present     Perception     Praxis      Pertinent Vitals/Pain Pain Assessment:  Faces Pain Score: 10-Worst pain ever Pain Location: head Pain Descriptors / Indicators: Headache Pain Intervention(s): Monitored during session;Premedicated before session;Repositioned     Hand Dominance Right   Extremity/Trunk Assessment Upper Extremity Assessment Upper Extremity Assessment: Generalized weakness;LUE deficits/detail LUE Deficits / Details: finger tips feel completely  numb, L arm is numb for months   Lower Extremity Assessment Lower Extremity Assessment: LLE deficits/detail LLE Deficits / Details: states "if i sit on toilet too long my left leg goes numb"   Cervical / Trunk Assessment Cervical / Trunk Assessment: Other exceptions Cervical / Trunk Exceptions: decreased head control s/p ventriculostomy   Communication Communication Communication: Other (comment) (reports no changes right now)   Cognition Arousal/Alertness: Awake/alert Behavior During Therapy: WFL for tasks assessed/performed Overall Cognitive Status: Impaired/Different from baseline Area of Impairment: Orientation                 Orientation Level: Disoriented to;Time             General Comments: reports april 2022 and when cued incorrect states "march" pt aware of location and reason for admission.   General Comments  BP 148/75 and sitting in chair 143/85 Pt with consistent HA and sensitivity to sound. Pt tolerating chair with ice on neck    Exercises     Shoulder Instructions      Home Living Family/patient expects to be discharged to:: Private residence Living Arrangements: Spouse/significant other Available Help at Discharge: Family;Available PRN/intermittently Type of Home: House Home Access: Level entry     Home Layout: Two level;Able to live on main level with bedroom/bathroom   Alternate Level Stairs-Rails: Left;Right Bathroom Shower/Tub: Teacher, early years/pre: Standard     Home Equipment: Environmental consultant - 2 wheels;Cane - single point;Bedside commode   Additional Comments: 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 Had someone put on socks over weekend and will ask for help from family if having a "bad day"        OT Problem List: Decreased activity tolerance;Impaired balance (sitting  and/or standing);Decreased safety awareness;Decreased knowledge of use of DME or AE;Decreased knowledge of precautions;Impaired sensation;Obesity;Pain      OT Treatment/Interventions: Self-care/ADL training;Therapeutic exercise;Neuromuscular education;Energy conservation;DME and/or AE instruction;Manual therapy;Therapeutic activities;Patient/family education;Balance training    OT Goals(Current goals can be found in the care plan section) Acute Rehab OT Goals Patient Stated Goal: to make head stop hurting ( my neck) OT Goal Formulation: With patient Time For Goal Achievement: 07/14/20 Potential to Achieve Goals: Good  OT Frequency: Min 2X/week   Barriers to D/C:            Co-evaluation PT/OT/SLP Co-Evaluation/Treatment: Yes Reason for Co-Treatment: For patient/therapist safety;To address functional/ADL transfers PT goals addressed during session: Mobility/safety with mobility OT goals addressed during session: Proper use of Adaptive equipment and DME;ADL's and self-care      AM-PAC OT "6 Clicks" Daily Activity     Outcome Measure Help from another person eating meals?: A Little Help from another person taking care of personal grooming?: A Little Help from another person toileting, which includes using toliet, bedpan, or urinal?: A Lot Help from another person bathing (including washing, rinsing, drying)?: A Lot Help from another person to put on and taking off regular upper body clothing?: A Lot Help from another person to put on and taking  off regular lower body clothing?: A Little 6 Click Score: 15   End of Session Nurse Communication: Mobility status;Precautions  Activity Tolerance: Patient tolerated treatment well Patient left: in chair;with call bell/phone within reach;with chair alarm set  OT Visit Diagnosis: Unsteadiness on feet (R26.81);Muscle weakness (generalized) (M62.81);Pain                Time: 1443-1540 OT Time Calculation (min): 28 min Charges:  OT General  Charges $OT Visit: 1 Visit OT Evaluation $OT Eval Moderate Complexity: 1 Mod   Brynn, OTR/L  Acute Rehabilitation Services Pager: [phone] Office: [phone] .   Jeri Modena 06/30/2020, 10:16 AM

## 2020-06-30 NOTE — Progress Notes (Signed)
Transcranial Doppler  Date POD PCO2 HCT BP  MCA ACA PCA OPHT SIPH VERT Basilar  3/29 mr     Right  Left   71  66   -46  -44   25  36   22  24   30   *   -29  -25     -43         Right  Left                                            Right  Left                                             Right  Left                                             Right  Left                                            Right  Left                                            Right  Left                                        MCA = Middle Cerebral Artery      OPHT = Opthalmic Artery     BASILAR = Basilar Artery   ACA = Anterior Cerebral Artery     SIPH = Carotid Siphon PCA = Posterior Cerebral Artery   VERT = Verterbral Artery                   Normal MCA = 62+\-12 ACA = 50+\-12 PCA = 42+\-23   Amy Barry 06/30/2020 2:12 PM

## 2020-06-30 NOTE — Evaluation (Signed)
Physical Therapy Evaluation Patient Details Name: Amy Barry MRN: [id number] DOB: 04/27/1970 Today's Date: 06/30/2020   History of Present Illness  Pt is a 50 y.o. F admitted 3/28 with AMS and incomprehensible speech. 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 Significant PMH: HTN, lupus, heart murmur, cervical fusion.  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 Pt presents with decreased functional mobility secondary to headache, weakness, decreased head control, balance deficits. Pt requiring two person minimal assist for stand pivot transfer. BP stable throughout, 148/75 supine and 143/85 sitting in recliner. Suspect good progress given age, PLOF, and motivation. Recommending CIR to address deficits and maximize functional independence.       Follow Up Recommendations CIR    Equipment Recommendations  3in1 (PT)    Recommendations for Other Services       Precautions / Restrictions Precautions Precautions: Fall;Other (comment) Precaution Comments: EVD, clamp prior to mobility; SBP < 140 Restrictions Weight Bearing Restrictions: No      Mobility  Bed Mobility Overal bed mobility: Needs Assistance Bed Mobility: Supine to Sit     Supine to sit: Mod assist;+2 for physical assistance     General bed mobility comments: Pt bringing BLE's off edge of bed with cues, assist for trunk to upright with head support    Transfers Overall transfer level: Needs assistance Equipment used: None Transfers: Sit to/from [facility] Sit to Stand: Min assist;+2 physical assistance Stand pivot transfers: Min assist;+2 physical assistance       General transfer comment: MinA + 2 for stand pivot transfer towards right, guidance for hips over to chair, head support provided. No knee  buckle noted  Ambulation/Gait                Stairs            Wheelchair Mobility    Modified Rankin (Stroke Patients Only) Modified Rankin (Stroke Patients Only) Pre-Morbid Rankin Score: No symptoms Modified Rankin: Moderately severe disability     Balance Overall balance assessment: Needs assistance Sitting-balance support: Bilateral upper extremity supported;Feet supported Sitting balance-Leahy Scale: Poor Sitting balance - Comments: reports head is heavy and reliance on therapists for support for comfort. pt is able to engage core Postural control: Posterior lean Standing balance support: Bilateral upper extremity supported Standing balance-Leahy Scale: Poor Standing balance comment: reliant on HHA                             Pertinent Vitals/Pain Pain Assessment: Faces Pain Score: 10-Worst pain ever Pain Location: head Pain Descriptors / Indicators: Headache Pain Intervention(s): Limited activity within patient's tolerance;Monitored during session;Ice applied    Home Living Family/patient expects to be discharged to:: Private residence Living Arrangements: Spouse/significant other (girls 18 and 21) Available Help at Discharge: Family;Available PRN/intermittently Type of Home: House Home Access: Level entry     Home Layout: Two level;Able to live on main level with bedroom/bathroom Home Equipment: Gilford Rile - 2 wheels;Cane - single point;Bedside commode 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 Had someone put on socks over weekend and will ask for help from family if having  a "bad day"     Hand Dominance   Dominant Hand: Right    Extremity/Trunk Assessment   Upper Extremity Assessment Upper Extremity Assessment: Generalized weakness;LUE deficits/detail LUE Deficits / Details:  finger tips feel completely numb, L arm is numb for months    Lower Extremity Assessment Lower Extremity Assessment: LLE deficits/detail LLE Deficits / Details: states "if i sit on toilet too long my left leg goes numb" LLE Sensation: decreased light touch (distally)    Cervical / Trunk Assessment Cervical / Trunk Assessment: Other exceptions Cervical / Trunk Exceptions: decreased head control s/p ventriculostomy  Communication   Communication: Other (comment) (reports no changes right now)  Cognition Arousal/Alertness: Awake/alert Behavior During Therapy: WFL for tasks assessed/performed Overall Cognitive Status: Impaired/Different from baseline Area of Impairment: Orientation                 Orientation Level: Disoriented to;Time             General Comments: reports april 2022 and when cued incorrect states "march" pt aware of location and reason for admission.      General Comments General comments (skin integrity, edema, etc.): BP 148/75 and sitting in chair 143/85 Pt with consistent HA and sensitivity to sound. Pt tolerating chair with ice on neck    Exercises     Assessment/Plan    PT Assessment Patient needs continued PT services  PT Problem List Decreased strength;Decreased activity tolerance;Decreased balance;Decreased mobility;Impaired sensation;Pain       PT Treatment Interventions DME instruction;Gait training;Stair training;Functional mobility training;Therapeutic activities;Therapeutic exercise;Balance training;Patient/family education    PT Goals (Current goals can be found in the Care Plan section)  Acute Rehab PT Goals Patient Stated Goal: to make head stop hurting ( my neck) PT Goal Formulation: With patient Time For Goal Achievement: 07/14/20 Potential to Achieve Goals: Good    Frequency Min 4X/week   Barriers to discharge        Co-evaluation PT/OT/SLP Co-Evaluation/Treatment: Yes Reason for Co-Treatment: For patient/therapist  safety;To address functional/ADL transfers PT goals addressed during session: Mobility/safety with mobility;Balance OT goals addressed during session: Proper use of Adaptive equipment and DME;ADL's and self-care       AM-PAC PT "6 Clicks" Mobility  Outcome Measure Help needed turning from your back to your side while in a flat bed without using bedrails?: A Lot Help needed moving from lying on your back to sitting on the side of a flat bed without using bedrails?: Total Help needed moving to and from a bed to a chair (including a wheelchair)?: A Little Help needed standing up from a chair using your arms (e.g., wheelchair or bedside chair)?: A Little Help needed to walk in hospital room?: A Lot Help needed climbing 3-5 steps with a railing? : Total 6 Click Score: 12    End of Session   Activity Tolerance: Patient tolerated treatment well Patient left: in chair;with call bell/phone within reach;with chair alarm set Nurse Communication: Mobility status PT Visit Diagnosis: Pain;Difficulty in walking, not elsewhere classified (R26.2) Pain - part of body:  (head)    Time: 5035-4656 PT Time Calculation (min) (ACUTE ONLY): 29 min   Charges:   PT Evaluation $PT Eval Moderate Complexity: [place], PT, DPT [place] Pager [phone] Office [phone]   Deno Etienne 06/30/2020, 10:33 AM

## 2020-06-30 NOTE — Consult Note (Signed)
Physical Medicine and Rehabilitation Consult   Reason for Consult:Functional deficits.  Referring Physician: Dr. Kathyrn Sheriff.    HPI: Amy Barry is a 50 y.o. female with history of HTN, IBS, anemia, connective tissue disorder---on MTX and plaquenil, family history of cerebral aneurysms who was admitted on 06/29/20 after found by daughter with slurred speech and confusion. She was found to have [place] in posterior fossa felt to be aneurysmal and . CTA head neck did not show definate source of bleed but had poor visualization of proximal L-ICA with tiny bulbous area question ruptured dissection. She was evaluated by NS and underwent cerebral angio with placement of right frontal ventric drain by Dr. Kathyrn Sheriff. This was negative for AVN, aneurysm, fistula or dissection. She continues on nimotop with TCD to monitor for vasospasms and SBP goal<140. Therapy evaluations completed today revealing limitations due to pain, weakness and balance deficits. CIR recommended due to functional decline.    Pt c/o severe neck pain since came into hospital.  Also HA's B/L frontal HA's- behind eyes- horrific- meds not helping much.  LBM 1 week ago- usually goes Monday to Wednesday due to plaquenil No BM since admission L hip pain- L groin- in crease- doesn't know why. New!  Review of Systems  Constitutional: Negative for fever.  HENT: Negative for hearing loss and tinnitus.   Eyes: Positive for blurred vision (but does not have her glasses). Negative for double vision.  Respiratory: Positive for shortness of breath.   Cardiovascular: Negative for leg swelling.  Gastrointestinal: Positive for heartburn. Negative for constipation.  Genitourinary: Negative for dysuria and frequency.  Musculoskeletal: Positive for joint pain (left hip for past few weeks) and myalgias.  Skin: Negative for rash.  Neurological: Positive for weakness and headaches.  Psychiatric/Behavioral: Negative for memory loss.  All other  systems reviewed and are negative.     Past Medical History:  Diagnosis Date  . Arthralgia   . Generalized weakness    bilateral upper and lower extremities,  walks with cane  . GERD (gastroesophageal reflux disease)   . Headache(784.0)   . Heart murmur    per pt "since childhood, no problems"  . History of kidney stones   . Hypertension    followed pcp  . IBS (irritable bowel syndrome)   . Iron deficiency anemia    12-06-2017 per pt recently had IV iron infusion July 2019  . Low vitamin D level   . Osteoarthritis   . PVC (premature ventricular contraction)    per pt had holter monitor  . SLE (systemic lupus erythematosus) ([place])    rheumotologist-- dr syed  . Wears glasses     Past Surgical History:  Procedure Laterality Date  . CERVICAL DISC ARTHROPLASTY N/A 10/23/2015   Procedure: Cervical Disc Arthroplasty Cervical six-seven;  Surgeon: Eustace Moore, MD;  Location: [place] [facility];  Service: Neurosurgery;  Laterality: N/A;  . D & C HYSTEROSCOPY W/ RESECTION ENDOMETRIAL POLYP  10-13-2003    dr rivard @[facility]   . ESOPHAGOGASTRODUODENOSCOPY     for GERD  . IR ANGIO INTRA EXTRACRAN SEL INTERNAL CAROTID BILAT MOD SED  06/29/2020  . IR ANGIO VERTEBRAL SEL VERTEBRAL BILAT MOD SED  06/29/2020  . LAPAROSCOPIC ABDOMINAL EXPLORATION  1996   dx ibs  . OVARIAN CYST SURGERY  age 89   x2 cyst  . RADIOLOGY WITH ANESTHESIA N/A 06/29/2020   Procedure: IR WITH ANESTHESIA;  Surgeon: Radiologist, Medication, MD;  Location: [facility];  Service: Radiology;  Laterality: N/A;  . ROBOTIC ASSISTED TOTAL HYSTERECTOMY N/A 12/13/2017   Procedure: XI ROBOTIC ASSISTED TOTAL HYSTERECTOMYWITH BILATERAL SALPINGECTOMY;  Surgeon: Christophe Louis, MD;  Location: [facility];  Service: Gynecology;  Laterality: N/A;  . TUBAL LIGATION Bilateral 06-01-2002   dr Mancel Bale @[facility]    PPTL    Family History  Problem Relation Age of Onset  . Arthritis Other   . Hyperlipidemia Other   . Hypertension Daughter   . Colon polyps Daughter 9        hermatoma oversized polyps-benign  . Stroke Other   . Leukemia Maternal Grandmother   . Heart disease Maternal Grandmother   . Asthma Sister   . Hypertension Mother        hx aneursym and surgery  . Atrial fibrillation Mother   . Sarcoidosis Mother   . Healthy Father   . Colon cancer Neg Hx     Social History: Married. Works as a Marine scientist for [employer]. She was independent without AD for the past year (has cane, Knee, etc) she   reports that she has never smoked. She has never used smokeless tobacco. She reports that she does not drink alcohol and does not use drugs.    Allergies  Allergen Reactions  . Imitrex [Sumatriptan] Other (See Comments)    High blood pressure  . Demerol [Meperidine] Nausea And Vomiting  . Oxycodone Other (See Comments)    DROWSY  . Reglan [Metoclopramide] Other (See Comments)    "shakiness"  . Tramadol Nausea Only  . Codeine Palpitations  . Propoxyphene N-Acetaminophen Nausea And Vomiting    Medications Prior to Admission  Medication Sig Dispense Refill  . amLODipine (NORVASC) 5 MG tablet Take 5 mg by mouth daily.    Marland Kitchen docusate sodium (COLACE) 50 MG capsule Take 50 mg by mouth daily as needed (for constipation.).    Marland Kitchen folic acid (FOLVITE) 1 MG tablet Take 1 mg by mouth daily.    . furosemide (LASIX) 40 MG tablet Take 1 tablet (40 mg total) by mouth daily. 90 tablet 3  . gabapentin (NEURONTIN) 300 MG capsule Take 300 mg by mouth 3 (three) times daily as needed (pain).    . hydroxychloroquine (PLAQUENIL) 200 MG tablet Take 200 mg by mouth 2 (two) times daily.   1  . ibuprofen (ADVIL,MOTRIN) 200 MG tablet Take 600-800 mg by mouth every 6 (six) hours as needed (for pain/fever).    . methotrexate 2.5 MG tablet Take 15 mg by mouth once a week. Friday    . metoprolol (LOPRESSOR) 100 MG tablet TAKE ONE TABLET BY MOUTH TWICE DAILY (Patient taking differently: Take 100 mg by mouth 2 (two) times daily.) 180 tablet 3  . Multiple Vitamin (MULTIVITAMIN WITH  MINERALS) TABS Take 1 tablet by mouth daily.     Marland Kitchen omeprazole (PRILOSEC) 40 MG capsule Take 40 mg by mouth daily as needed (acid reflux).    . ondansetron (ZOFRAN) 4 MG tablet Take 1 tablet (4 mg total) by mouth every 6 (six) hours as needed for nausea. 20 tablet 0  . pantoprazole (PROTONIX) 40 MG tablet Take 40 mg by mouth daily as needed (for acid reflux/indigestion).     . potassium chloride (K-DUR) 10 MEQ tablet TAKE ONE TABLET BY MOUTH ONCE DAILY (Patient taking differently: Take 10 mEq by mouth daily.) 90 tablet 3  . TURMERIC PO Take 1 capsule by mouth daily.      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 Had someone put on socks over weekend and will ask for help from family if having a "bad day" Functional Status:  Mobility: Bed Mobility Overal bed mobility: Needs Assistance Bed Mobility: Supine to Sit Supine to sit: Mod assist,+2 for physical assistance General bed mobility comments: Pt bringing BLE's off edge of bed with cues, assist for trunk to upright with head support Transfers Overall transfer level: Needs assistance Equipment used: None Transfers: Sit to/from [employer] Sit to Stand: Min assist,+2 physical assistance Stand pivot transfers: Min assist,+2 physical  assistance General transfer comment: MinA + 2 for stand pivot transfer towards right, guidance for hips over to chair, head support provided. No knee buckle noted      ADL: ADL Overall ADL's : Needs assistance/impaired Eating/Feeding: Minimal assistance,Sitting Grooming: Wash/dry face,Minimal assistance,Sitting Upper Body Bathing: Moderate assistance Lower Body Bathing: Maximal assistance Lower Body Dressing: Maximal assistance Lower Body Dressing Details (indicate cue type and reason): can figure 4 cross bil LE but limited by pain in head at this time. Therapist total (A) Toilet Transfer: +2 for physical assistance,Minimal assistance Toilet Transfer Details (indicate cue type and reason): simulated EOB to chair. Pt is only 5'1" so bed is elevated for her to basic transfer to chair General ADL Comments: pt HA and reports pain at neck. pt progressed to sitting with support of trunk and neck. pt states "my head feels heavy"  Cognition: Cognition Overall Cognitive Status: Impaired/Different from baseline Arousal/Alertness: Lethargic Orientation Level: Oriented to person,Oriented to place,Oriented to situation Attention: Sustained Sustained Attention: Impaired Sustained Attention Impairment: Verbal basic Memory:  (will assess further) Awareness: Impaired Awareness Impairment: Anticipatory impairment,Emergent impairment,Intellectual impairment Problem Solving:  (will assess when alertness improves) Safety/Judgment: Impaired Cognition Arousal/Alertness: Awake/alert Behavior During Therapy: WFL for tasks assessed/performed Overall Cognitive Status: Impaired/Different from baseline Area of Impairment: Orientation Orientation Level: Disoriented to,Time General Comments: reports april 2022 and when cued incorrect states "march" pt aware of location and reason for admission.   Blood pressure (!) 141/73, pulse 86, temperature 98.5 F (36.9 C), temperature source Oral, resp. rate 14,  height 5\' 4"  (1.626 m), weight 85.7 kg, last menstrual period 12/22/2017, SpO2 100 %. Physical Exam Vitals and nursing note reviewed.  Constitutional:      Comments: [place] in nare. Right ventriculostomy drain right scalp.   Pt awake, but sleepy- sitting up somewhat in bed c/o neck pain; nurse in and out, EVD in R frontal area, NAD Eating chicken noodle soup  HENT:     Head:     Comments: EVD in R frontal scalp as above- smile appears equal at rest    Right Ear: External ear normal.     Left Ear: External ear normal.     Nose: Nose normal. No congestion.     Mouth/Throat:     Mouth: Mucous membranes are dry.     Pharynx: Oropharynx is clear. No oropharyngeal exudate.  Eyes:     General:        Right eye: No discharge.  Left eye: No discharge.     Conjunctiva/sclera: Conjunctivae normal.  Neck:     Comments: Tight in scalenes, levators, and upper traps and splenius capitus B/L- very tight Cardiovascular:     Rate and Rhythm: Normal rate and regular rhythm.     Heart sounds: Normal heart sounds. No murmur heard. No gallop.   Pulmonary:     Comments: CTA B/L- no W/R/R- good air movement Abdominal:     Comments: Soft, NT, ND, (+)BS - non distended, however hypoactive- no tinkling  Musculoskeletal:     Comments: Able to have at least 4/5 grip B/L as well as wiggling fingers and toes- pretty equally- DF/PF at least 4/5 B/L  TTP over L inner groin/crease of hip- appears more pelvic pain than L hip pain  Skin:    General: Skin is warm and dry.  Neurological:     Mental Status: She is oriented to person, place, and time and easily aroused. She is lethargic.     Comments: Delayed processing but internally distracted by pain and lethargic. She was able to answer basic orentation questions. She needed cues to stay awake. She was able to move all four.   Slowed processing and delayed- but did answer questions appropriately.  Decreased to light touch in RLE- said is chronic Intact  in UEs and LLE   Psychiatric:     Comments: Slightly flat- able to answer questions about family/job, etc     Results for orders placed or performed during the hospital encounter of 06/29/20 (from the past 24 hour(s))  Triglycerides     Status: None   Collection Time: 06/30/20  4:34 AM  Result Value Ref Range   Triglycerides 66 <150 mg/dL   CT ANGIO HEAD W OR WO CONTRAST  Result Date: 06/29/2020 CLINICAL DATA:  Subarachnoid hemorrhage. EXAM: CT ANGIOGRAPHY HEAD AND NECK TECHNIQUE: Multidetector CT imaging of the head and neck was performed using the standard protocol during bolus administration of intravenous contrast. Multiplanar CT image reconstructions and MIPs were obtained to evaluate the vascular anatomy. Carotid stenosis measurements (when applicable) are obtained utilizing NASCET criteria, using the distal internal carotid diameter as the denominator. CONTRAST:  41mL OMNIPAQUE IOHEXOL 350 MG/ML SOLN COMPARISON:  CTA of the head 06/19/2012 FINDINGS: CTA NECK FINDINGS Aortic arch: Normal.  Three vessel branching. Right carotid system: Vessels are smooth and widely patent. Left carotid system: Vessels are smooth and widely patent. Vertebral arteries: Vessels are smooth and widely patent. Skeleton: Disc arthroplasty at C6-7. Other neck: No evidence of inflammation or mass. Upper chest: Negative Review of the MIP images confirms the above findings CTA HEAD FINDINGS Anterior circulation: Mild calcification at the anterior genu of the right cavernous ICA. No branch occlusion, beading, or aneurysm. Some arteries are partially obscured by venous contamination. Posterior circulation: The vertebral and basilar arteries are smoothly contoured and widely patent. The left PICA origin is not well demonstrated and there is a subsequent tiny bulbous appearance, see 8:151. No visible vascular malformation or venous shunting. Venous sinuses: Diffusely patent Anatomic variants: None significant Review of the MIP  images confirms the above findings IMPRESSION: No definite source of subarachnoid hemorrhage but there poor visualization of the proximal left PICA with a tiny bulbous area, question ruptured dissection. Electronically Signed   By: Monte Fantasia M.D.   On: 06/29/2020 07:08   CT HEAD WO CONTRAST  Result Date: 06/29/2020 CLINICAL DATA:  Intracranial hemorrhage, follow-up EXAM: CT HEAD WITHOUT CONTRAST TECHNIQUE: Contiguous axial images were obtained  from the base of the skull through the vertex without intravenous contrast. COMPARISON:  Earlier same day FINDINGS: Brain: Subarachnoid hemorrhage is present within the basal cisterns and within the ventricular system. Apparent increased hyperdensity within basal cisterns and sulci probably related to interval angiogram. There is increased hydrocephalus.  Increased sulcal effacement. Gray-white differentiation is preserved. Vascular: Residual contrast from angiogram. Skull: Calvarium is unremarkable. Sinuses/Orbits: No acute finding. Other: None. IMPRESSION: Subarachnoid hemorrhage within the basal cisterns and ventricular system. Evaluation of the basal cisterns and cortical sulci limited by residual contrast from angiogram. Increased hydrocephalus with increased sulcal effacement. Electronically Signed   By: Macy Mis M.D.   On: 06/29/2020 14:13   CT ANGIO NECK W OR WO CONTRAST  Result Date: 06/29/2020 CLINICAL DATA:  Subarachnoid hemorrhage. EXAM: CT ANGIOGRAPHY HEAD AND NECK TECHNIQUE: Multidetector CT imaging of the head and neck was performed using the standard protocol during bolus administration of intravenous contrast. Multiplanar CT image reconstructions and MIPs were obtained to evaluate the vascular anatomy. Carotid stenosis measurements (when applicable) are obtained utilizing NASCET criteria, using the distal internal carotid diameter as the denominator. CONTRAST:  33mL OMNIPAQUE IOHEXOL 350 MG/ML SOLN COMPARISON:  CTA of the head 06/19/2012  FINDINGS: CTA NECK FINDINGS Aortic arch: Normal.  Three vessel branching. Right carotid system: Vessels are smooth and widely patent. Left carotid system: Vessels are smooth and widely patent. Vertebral arteries: Vessels are smooth and widely patent. Skeleton: Disc arthroplasty at C6-7. Other neck: No evidence of inflammation or mass. Upper chest: Negative Review of the MIP images confirms the above findings CTA HEAD FINDINGS Anterior circulation: Mild calcification at the anterior genu of the right cavernous ICA. No branch occlusion, beading, or aneurysm. Some arteries are partially obscured by venous contamination. Posterior circulation: The vertebral and basilar arteries are smoothly contoured and widely patent. The left PICA origin is not well demonstrated and there is a subsequent tiny bulbous appearance, see 8:151. No visible vascular malformation or venous shunting. Venous sinuses: Diffusely patent Anatomic variants: None significant Review of the MIP images confirms the above findings IMPRESSION: No definite source of subarachnoid hemorrhage but there poor visualization of the proximal left PICA with a tiny bulbous area, question ruptured dissection. Electronically Signed   By: Monte Fantasia M.D.   On: 06/29/2020 07:08   CT C-SPINE NO CHARGE  Result Date: 06/29/2020 CLINICAL DATA:  Subarachnoid hemorrhage. EXAM: CT CERVICAL SPINE WITHOUT CONTRAST TECHNIQUE: Reformatted images of the cervical spine were generated from CTA of the neck. COMPARISON:  05/18/2019 cervical MRI FINDINGS: Alignment: No listhesis. Skull base and vertebrae: No acute fracture. No primary bone lesion or focal pathologic process. C6-7 disc arthroplasty in expected location. Soft tissues and spinal canal: Known subarachnoid hemorrhage. Reference CTA. Disc levels:  No visible impingement. Upper chest: Negative IMPRESSION: 1. No acute finding on cervical spine reformats. 2. Normal appearing C6-7 disc arthroplasty. Electronically Signed    By: Monte Fantasia M.D.   On: 06/29/2020 07:47   CT HEAD CODE STROKE WO CONTRAST  Result Date: 06/29/2020 CLINICAL DATA:  Code stroke.  Weakness and slurred speech. EXAM: CT HEAD WITHOUT CONTRAST TECHNIQUE: Contiguous axial images were obtained from the base of the skull through the vertex without intravenous contrast. COMPARISON:  Brain MRI 05/18/2019 FINDINGS: Brain: Subarachnoid hemorrhage the foramen magnum and prepontine cisterns with intraventricular reflux into the lateral ventricles. Lateral ventriculomegaly. No visible infarct or mass. Vascular: Negative by noncontrast technique. Skull: No acute or traumatic finding Sinuses/Orbits: Negative Other: Critical Value/emergent results were  called by telephone at the time of interpretation on 06/29/2020 at 6:36 am to provider Dr Rory Percy , who verbally acknowledged these results. ASPECTS [facility] Stroke Program Early CT Score) - Ganglionic level infarction (caudate, lentiform nuclei, internal capsule, insula, M1-M3 cortex): Not scored in this setting IMPRESSION: Subarachnoid hemorrhage in the posterior fossa with aneurysmal pattern. There is intraventricular reflux and ventriculomegaly. Electronically Signed   By: Monte Fantasia M.D.   On: 06/29/2020 06:53   IR ANGIO INTRA EXTRACRAN SEL INTERNAL CAROTID BILAT MOD SED  Result Date: 06/29/2020 PROCEDURE: DIAGNOSTIC CEREBRAL ANGIOGRAM HISTORY: The patient is a 50 year old woman presents to the emergency department after sudden onset of severe headache and speech difficulty, brought in by family. Initial CT scan demonstrated subarachnoid hemorrhage with extension into primarily the fourth ventricle. CT angiogram did not demonstrate any intracranial aneurysms. Patient presents now for further work-up with diagnostic cerebral angiogram. ACCESS: The technical aspects of the procedure as well as its potential risks and benefits were reviewed with the patient and her husband. These risks included but were not  limited bleeding, infection, allergic reaction, damage to organs or vital structures, stroke, non-diagnostic procedure, and the catastrophic outcomes of heart attack, coma, and death. With an understanding of these risks, informed consent was obtained and witnessed. The patient was placed in the supine position on the angiography table and the skin of right groin prepped in the usual sterile fashion. The procedure was performed under local anesthesia (1%-solution of bicarbonate-buffered Lidocaine) and conscious sedation monitored by the anesthesia service. A 5- French sheath was introduced in the right common femoral artery using Seldinger technique. A fluoro-phase sequence was used to document the sheath position. MEDICATIONS: HEPARIN: 0 Units total. CONTRAST:  34mL OMNIPAQUE IOHEXOL 300 MG/ML  SOLNcc, Omnipaque 300 FLUOROSCOPY TIME:  FLUOROSCOPY TIME: See IR records TECHNIQUE: CATHETERS AND WIRES 5-French JB-1 catheter 0.035" glidewire VESSELS CATHETERIZED Right internal carotid Left internal carotid Left vertebral Right vertebral Right common femoral VESSELS STUDIED Right internal carotid, head Left internal carotid, head Left vertebral Right vertebral Right femoral PROCEDURAL NARRATIVE A 5-Fr JB-1 catheter was advanced over a 0.035 glidewire into the aortic arch. The above vessels were then sequentially catheterized and cervical / cerebral angiograms taken. After review of images, the catheter was removed without incident. FINDINGS: Right internal carotid, head: Injection reveals the presence of a widely patent ICA, M1, and A1 segments and their branches. No aneurysms, AVMs, or high-flow fistulas are seen. There is no vasospasm of the right carotid circulation. The parenchymal and venous phases are normal. The venous sinuses are widely patent. Left internal carotid, head: Injection reveals the presence of a widely patent ICA, A1, and M1 segments and their branches. No aneurysms, AVMs, or high-flow fistulas are  seen. There is no vasospasm of the left carotid circulation. The parenchymal and venous phases are normal. The venous sinuses are widely patent. Left vertebral: Injection reveals the presence of a widely patent vertebral artery. This leads to a widely patent basilar artery that terminates in bilateral P1. The basilar apex is normal. No aneurysms, AVMs, or high-flow fistulas are seen. The PICA origin is visualized without any aneurysm identified. There is no luminal irregularity to suggest dissection. No posterior circulation vasospasm is seen. The parenchymal and venous phases are normal. The venous sinuses are widely patent. Right vertebral: The vertebral artery is widely patent. No PICA aneurysm is seen. There is no evidence of dissection. See basilar description above. Right femoral: Normal vessel. No significant atherosclerotic disease. Arterial sheath in  adequate position. DISPOSITION: Upon completion of the study, the femoral sheath was removed and hemostasis obtained using a 5-Fr ExoSeal closure device. Good proximal and distal lower extremity pulses were documented upon achievement of hemostasis. The procedure was well tolerated and no early complications were observed. The patient was transferred to the holding area to lay flat for 2 hours. IMPRESSION: 1. Normal angiogram without intracranial aneurysm, AVM, or fistula. No vertebral artery dissection is identified. The preliminary results of this procedure were shared with the patient's family. Electronically Signed   By: Consuella Lose   On: 06/29/2020 14:01   IR ANGIO VERTEBRAL SEL VERTEBRAL BILAT MOD SED  Result Date: 06/29/2020 PROCEDURE: DIAGNOSTIC CEREBRAL ANGIOGRAM HISTORY: The patient is a 49 year old woman presents to the emergency department after sudden onset of severe headache and speech difficulty, brought in by family. Initial CT scan demonstrated subarachnoid hemorrhage with extension into primarily the fourth ventricle. CT angiogram  did not demonstrate any intracranial aneurysms. Patient presents now for further work-up with diagnostic cerebral angiogram. ACCESS: The technical aspects of the procedure as well as its potential risks and benefits were reviewed with the patient and her husband. These risks included but were not limited bleeding, infection, allergic reaction, damage to organs or vital structures, stroke, non-diagnostic procedure, and the catastrophic outcomes of heart attack, coma, and death. With an understanding of these risks, informed consent was obtained and witnessed. The patient was placed in the supine position on the angiography table and the skin of right groin prepped in the usual sterile fashion. The procedure was performed under local anesthesia (1%-solution of bicarbonate-buffered Lidocaine) and conscious sedation monitored by the anesthesia service. A 5- French sheath was introduced in the right common femoral artery using Seldinger technique. A fluoro-phase sequence was used to document the sheath position. MEDICATIONS: HEPARIN: 0 Units total. CONTRAST:  61mL OMNIPAQUE IOHEXOL 300 MG/ML  SOLNcc, Omnipaque 300 FLUOROSCOPY TIME:  FLUOROSCOPY TIME: See IR records TECHNIQUE: CATHETERS AND WIRES 5-French JB-1 catheter 0.035" glidewire VESSELS CATHETERIZED Right internal carotid Left internal carotid Left vertebral Right vertebral Right common femoral VESSELS STUDIED Right internal carotid, head Left internal carotid, head Left vertebral Right vertebral Right femoral PROCEDURAL NARRATIVE A 5-Fr JB-1 catheter was advanced over a 0.035 glidewire into the aortic arch. The above vessels were then sequentially catheterized and cervical / cerebral angiograms taken. After review of images, the catheter was removed without incident. FINDINGS: Right internal carotid, head: Injection reveals the presence of a widely patent ICA, M1, and A1 segments and their branches. No aneurysms, AVMs, or high-flow fistulas are seen. There is no  vasospasm of the right carotid circulation. The parenchymal and venous phases are normal. The venous sinuses are widely patent. Left internal carotid, head: Injection reveals the presence of a widely patent ICA, A1, and M1 segments and their branches. No aneurysms, AVMs, or high-flow fistulas are seen. There is no vasospasm of the left carotid circulation. The parenchymal and venous phases are normal. The venous sinuses are widely patent. Left vertebral: Injection reveals the presence of a widely patent vertebral artery. This leads to a widely patent basilar artery that terminates in bilateral P1. The basilar apex is normal. No aneurysms, AVMs, or high-flow fistulas are seen. The PICA origin is visualized without any aneurysm identified. There is no luminal irregularity to suggest dissection. No posterior circulation vasospasm is seen. The parenchymal and venous phases are normal. The venous sinuses are widely patent. Right vertebral: The vertebral artery is widely patent. No PICA aneurysm is  seen. There is no evidence of dissection. See basilar description above. Right femoral: Normal vessel. No significant atherosclerotic disease. Arterial sheath in adequate position. DISPOSITION: Upon completion of the study, the femoral sheath was removed and hemostasis obtained using a 5-Fr ExoSeal closure device. Good proximal and distal lower extremity pulses were documented upon achievement of hemostasis. The procedure was well tolerated and no early complications were observed. The patient was transferred to the holding area to lay flat for 2 hours. IMPRESSION: 1. Normal angiogram without intracranial aneurysm, AVM, or fistula. No vertebral artery dissection is identified. The preliminary results of this procedure were shared with the patient's family. 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 Does  the need for close, 24 hr/day medical supervision in concert with the patient's rehab needs make it unreasonable for this patient to be served in a less intensive setting? Yes 2. Co-Morbidities requiring supervision/potential complications: <upus, GERD, HTN, IBS, SAH- generalized weakness 3. Due to bladder management, bowel management, safety, skin/wound care, disease management, medication administration, pain management and patient education, does the patient require 24 hr/day rehab nursing? Yes 4. Does the patient require coordinated care of a physician, rehab nurse, therapy disciplines of PT, OT and maybe SLP at least for higher level cognition to address physical and functional deficits in the context of the above medical diagnosis(es)? Yes Addressing deficits in the following areas: balance, endurance, locomotion, strength, transferring, bowel/bladder control, bathing, dressing, feeding, grooming, toileting, cognition and psychosocial support 5. Can the patient actively participate in an intensive therapy program of at least 3 hrs of therapy per day at least 5 days per week? Yes 6. The potential for patient to make measurable gains while on inpatient rehab is good 7. Anticipated functional outcomes upon discharge from inpatient rehab are supervision and min assist  with PT, supervision and min assist with OT, modified independent and supervision with SLP. 8. Estimated rehab length of stay to reach the above functional goals is: 14-18 days 9. Anticipated discharge destination: Home 10. Overall Rehab/Functional Prognosis: good  RECOMMENDATIONS: This patient's condition is appropriate for continued rehabilitative care in the following setting: CIR Patient has agreed to participate in recommended program. Potentially Note that insurance prior authorization may be required for reimbursement for recommended care.  Comment: Pt IS Appropriate for inpt rehab- however also has some other medical  recommendations.    1. Suggest muscle relaxant- for neck pain- like Flexeril- 10 mg TID prn- if too sedating then Skelaxin has no Ach effects and MUCH less sedating- 800 mg TID prn.  2. Lidoderm patches 2 patches 8pm to 8am - on each side of neck -covering scalenes, levators/upper traps, that are VERY tight-  In combo with muscle relaxants, should help neck pain.  3. If doesn't help- and still having HA's daily, suggest Topamax 50 mg QHS and then can titrate up slowly, for HA prevention.  4. LBM ~ 1 week ago, per pt- she thinks it was last Tuesday, so suggest KUB and then if full, give Sorbitol.  5. Will submit for admissions coordinators to send to insurance.  6. Thank you for consult.    Bary Leriche, PA-C 06/30/2020

## 2020-06-30 NOTE — Anesthesia Postprocedure Evaluation (Signed)
Anesthesia Post Note  Patient: Amy Barry  Procedure(s) Performed: IR WITH ANESTHESIA (N/A )     Patient location during evaluation: Other Anesthesia Type: MAC Level of consciousness: awake and alert Pain management: pain level controlled Vital Signs Assessment: post-procedure vital signs reviewed and stable Respiratory status: spontaneous breathing, nonlabored ventilation, respiratory function stable and patient connected to nasal cannula oxygen Cardiovascular status: stable and blood pressure returned to baseline Postop Assessment: no apparent nausea or vomiting Anesthetic complications: no   No complications documented.  Last Vitals:  Vitals:   06/30/20 0500 06/30/20 0600  BP: 113/73 123/70  Pulse: 86 87  Resp: 20 14  Temp:    SpO2: 99% 100%    Last Pain:  Vitals:   06/30/20 0600  TempSrc:   PainSc: Asleep                 Jody Silas,W. EDMOND

## 2020-06-30 NOTE — Progress Notes (Signed)
  Speech Language Pathology Treatment: Dysphagia;Cognitive-Linquistic  Patient Details Name: Amy Barry MRN: [id number] DOB: 10/06/1970 Today's Date: 06/30/2020 Time: 1003-1020 SLP Time Calculation (min) (ACUTE ONLY): 17 min  Assessment / Plan / Recommendation Clinical Impression  Pt has made significant improvements from yesterday in all areas. She is alert, communicating in full sentences, 100% intelligible and asking questions re: her daughters and her dog who is sick. Consumption of thin, puree and solid was unremarkable without indications of aspiration. Manipulated and transited graham cracker without difficulty. Her states her taste buds "are off" and doesn't want to "over do it". Recommend regular texture, thin liquids, straws allowed, pills with thin. No follow up needed for swallow.  SLP will continue to diagnostically intervene re: high level executive functions.   HPI HPI: Amy Barry is a 50 y.o. female with GERD, lupus, HTN, heart murmor who presented to the ED with altered mental status and incomprehensible speech. CT which revealed subarachnoid hemorrhage the foramen magnum and prepontine  cisterns with intraventricular reflux into the lateral ventricles      SLP Plan  Continue with current plan of care       Recommendations  Diet recommendations: Regular;Thin liquid Liquids provided via: Cup;Straw Medication Administration: Whole meds with liquid Supervision: Patient able to self feed Postural Changes and/or Swallow Maneuvers: Seated upright 90 degrees                Oral Care Recommendations: Oral care BID Follow up Recommendations: None SLP Visit Diagnosis: Dysphagia, unspecified (R13.10);Cognitive communication deficit (E08.144) Plan: Continue with current plan of care                       Houston Siren 06/30/2020, 10:34 AM  Orbie Pyo Colvin Caroli.Ed Risk analyst [phone] Office [phone]

## 2020-07-01 ENCOUNTER — Inpatient Hospital Stay (HOSPITAL_COMMUNITY): Payer: No Typology Code available for payment source

## 2020-07-01 DIAGNOSIS — I609 Nontraumatic subarachnoid hemorrhage, unspecified: Secondary | ICD-10-CM

## 2020-07-01 LAB — HEMOGLOBIN A1C
Hgb A1c MFr Bld: 5.4 % (ref 4.8–5.6)
Mean Plasma Glucose: 108.28 mg/dL

## 2020-07-01 LAB — BASIC METABOLIC PANEL
Anion gap: 7 (ref 5–15)
BUN: 5 mg/dL — ABNORMAL LOW (ref 6–20)
CO2: 27 mmol/L (ref 22–32)
Calcium: 8.9 mg/dL (ref 8.9–10.3)
Chloride: 104 mmol/L (ref 98–111)
Creatinine, Ser: 0.64 mg/dL (ref 0.44–1.00)
GFR, Estimated: >60 mL/min (ref >60)
Glucose, Bld: 90 mg/dL (ref 70–99)
Potassium: 3.4 mmol/L — ABNORMAL LOW (ref 3.5–5.1)
Sodium: 138 mmol/L (ref 135–145)

## 2020-07-01 LAB — GLUCOSE, CAPILLARY
Glucose-Capillary: 82 mg/dL (ref 70–99)
Glucose-Capillary: 96 mg/dL (ref 70–99)
Glucose-Capillary: 95 mg/dL (ref 70–99)
Glucose-Capillary: 105 mg/dL — ABNORMAL HIGH (ref 70–99)

## 2020-07-01 MED ORDER — METHOCARBAMOL 500 MG PO TABS
750.0000 mg | ORAL_TABLET | Freq: Four times a day (QID) | ORAL | Status: DC
  Administered 2020-07-01 – 2020-07-09 (×31): 750 mg via ORAL
  Filled 2020-07-01 (×32): qty 2

## 2020-07-01 MED ORDER — INSULIN ASPART 100 UNIT/ML ~~LOC~~ SOLN
0.0000 [IU] | Freq: Three times a day (TID) | SUBCUTANEOUS | Status: DC
  Administered 2020-07-04 – 2020-07-07 (×5): 2 [IU] via SUBCUTANEOUS

## 2020-07-01 MED ORDER — INSULIN ASPART 100 UNIT/ML ~~LOC~~ SOLN: 0.0000 [IU] | Freq: Every day | SUBCUTANEOUS | Status: DC

## 2020-07-01 NOTE — TOC Initial Note (Signed)
Transition of Care [facility]) - Initial/Assessment Note    Patient Details  Name: Amy Barry MRN: [id number] Date of Birth: [birth year]  [facility] CM/SW Contact:    Ella Bodo, RN Phone Number: 07/01/2020, 4:55 PM  Clinical Narrative:  Pt is a 50 y.o. F admitted 3/28 with AMS and incomprehensible speech. 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    Prior to admission, patient independent and living at home with spouse and children, ages 1 and 38.  PT/OT recommending CIR, and consult has been requested.  Family able to provide intermittent assistance at discharge.  Will follow as patient progresses.                Expected Discharge Plan: IP Rehab Facility Barriers to Discharge: Continued Medical Work up   Patient Goals and CMS Choice        Expected Discharge Plan and Services Expected Discharge Plan: [place]   Discharge Planning Services: CM Consult   Living arrangements for the past 2 months: Single Family Home                                      Prior Living Arrangements/Services Living arrangements for the past 2 months: Single Family Home  Lives with: Spouse and adult children Patient language and need for interpreter reviewed:: Yes Do you feel safe going back to the place where you live?: Yes      Need for Family Participation in Patient Care: Yes (Comment) Care giver support system in place?: Yes (comment)   Criminal Activity/Legal Involvement Pertinent to Current Situation/Hospitalization: No - Comment as needed  Activities of Daily Living Home Assistive Devices/Equipment: None ADL Screening (condition at time of admission) Patient's cognitive ability adequate to safely complete daily activities?: Yes Is the patient deaf or have difficulty hearing?: No Does the patient have difficulty seeing, even when  wearing glasses/contacts?: No Does the patient have difficulty concentrating, remembering, or making decisions?: No Patient able to express need for assistance with ADLs?: Yes Does the patient have difficulty dressing or bathing?: No Independently performs ADLs?: Yes (appropriate for developmental age) Does the patient have difficulty walking or climbing stairs?: No Weakness of Legs: None Weakness of Arms/Hands: None  Permission Sought/Granted                  Emotional Assessment Appearance:: Appears stated age Attitude/Demeanor/Rapport: Engaged Affect (typically observed): Accepting Orientation: : Oriented to Self,Oriented to Place,Oriented to  Time,Oriented to Situation      Admission diagnosis:  Subarachnoid hemorrhage ([place]) [I60.9] SAH (subarachnoid hemorrhage) ([place]) [I60.9] Patient Active Problem List   Diagnosis Date Noted  . Subarachnoid hemorrhage ([place]) 06/29/2020  . 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 S/P hysterectomy 12/13/2017  . Abnormal laboratory test 09/09/2016  . Gait abnormality 08/03/2016  . Weakness 08/03/2016  . Paresthesia 08/03/2016  . S/P cervical spinal fusion 10/23/2015  . Hypersomnia 12/08/2010  . 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  . EPIGASTRIC PAIN 10/13/2009  . IRRITABLE BOWEL SYNDROME 03/10/2009  . BACK PAIN, LUMBAR 07/01/2008  . COSTOCHONDRITIS 02/01/2008  . VIRAL URI 01/08/2008  . CHEST PAIN 01/08/2008  . 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 ARM PAIN 12/25/2007  . ANEMIA-NOS 10/02/2007  . Essential hypertension 10/02/2007  . GERD 10/02/2007  . POLYP, GALLBLADDER 10/02/2007  . UTERINE POLYP  10/02/2007  . OSTEOARTHRITIS 10/02/2007  . Dyskinesia 10/02/2007  . HEADACHE 10/02/2007  . PALPITATIONS 10/02/2007  . CARDIAC MURMUR, HX OF 10/02/2007  . NEPHROLITHIASIS, HX OF 10/02/2007   PCP:  Donald Prose, MD Pharmacy:   [id number] - [place], [address] [place] [address] Phone: [phone] Fax: [phone]     Social Determinants of Health (SDOH) Interventions    Readmission Risk Interventions No flowsheet data found.  Reinaldo Raddle, RN, BSN  Trauma/Neuro ICU Case Manager [phone]

## 2020-07-01 NOTE — PMR Pre-admission (Signed)
PMR Admission Coordinator Pre-Admission Assessment  Patient: Amy Barry is an 50 y.o., female MRN: [id number] DOB: [birth year] Height: '5\' 4"'  (162.6 cm) Weight: 85.7 kg              Insurance Information HMO:     PPO:      PCP:      IPA:      80/20:      OTHER: HSA Aetna Choice POS II PRIMARY: Comptroller      Policy#: [id number]      Subscriber: Pt CM Name: Marye Round      Phone#: [phone]     Fax#: [phone] Pre-Cert#: [id number]      Employer: [employer] Benefits:  Phone #: [phone]     Name: [email or web address] Eff. Date: 02/07/16     Deduct: [phone] (met $3302.83)      Out of Pocket Max: [phone] remaining)      Life Max: N/A  CIR: 80% coverage with 20% coinsurance      SNF: 80% coverage with 20% coinsurance Outpatient: 80%     Co-Pay: 20% Home Health: 80%      Co-Pay: 20% DME: 80%     Co-Pay: 20% Providers: in network  SECONDARY: Cigna Managed    Policy#: [id number]      Phone#: [phone]  Financial Counselor:       Phone#:   The "Data Collection Information Summary" for patients in Inpatient Rehabilitation Facilities with attached "Privacy Act [place] Records" was provided and verbally reviewed with: Patient  Emergency Contact Information Contact Information    Name Relation Home Work Stanton L [place] Daughter   [phone]     Current Medical History  Patient Admitting Diagnosis:SAH  History of Present Illness: Amy Barry is a 50 y.o. female with history of HTN, IBS, anemia, connective tissue disorder---on MTX and plaquenil, family history of cerebral aneurysms who was admitted on 06/29/20 after found by daughter with slurred speech and confusion. 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 CTA head neck did not show definate source of bleed but had poor visualization of proximal L-ICA with tiny bulbous area question ruptured dissection. She was evaluated by  NS and underwent cerebral angio with placement of right frontal ventric drain by Dr. Kathyrn Sheriff. This was negative for AVN, aneurysm, fistula or dissection. She continues on nimotop with TCD to monitor for vasospasms and SBP goal<140. Therapy evaluations completed today revealing limitations due to pain, weakness and balance deficits. CIR recommended due to functional decline.   Complete NIHSS TOTAL: 0 Glasgow Coma Scale Score: 15  Past Medical History  Past Medical History:  Diagnosis Date  . Arthralgia   . Generalized weakness    bilateral upper and lower extremities,  walks with cane  . GERD (gastroesophageal reflux disease)   . Headache(784.0)   . Heart murmur    per pt "since childhood, no problems"  . History of kidney stones   . Hypertension    followed pcp  . IBS (irritable bowel syndrome)   . Iron deficiency anemia    12-06-2017 per pt recently had IV iron infusion July 2019  . Low vitamin D level   . Osteoarthritis   . PVC (premature ventricular contraction)    per pt had holter monitor  . SLE (systemic lupus erythematosus) ([place])    rheumotologist-- dr syed  . Wears glasses     Family History  family history includes Arthritis in an other family member; Asthma in her sister; Atrial fibrillation in her mother; Colon polyps (age of onset: 2) in her daughter; Healthy in her father; Heart disease in her maternal grandmother; Hyperlipidemia in an other family member; Hypertension in her daughter and mother; Leukemia in her maternal grandmother; Sarcoidosis in her mother; Stroke in an other family member.  Prior Rehab/Hospitalizations:  Has the patient had prior rehab or hospitalizations prior to admission? No  Has the patient had major surgery during 100 days prior to admission? Yes  Current Medications   Current Facility-Administered Medications:  .   stroke: mapping our early stages of recovery book, , Does not apply, Once, Costella, Vincent J, PA-C .  0.9 %  sodium  chloride infusion, , Intravenous, Continuous, Consuella Lose, MD, Stopped at 07/09/20 [phone] .  acetaminophen (TYLENOL) tablet 650 mg, 650 mg, Oral, Q4H PRN **OR** [DISCONTINUED] acetaminophen (TYLENOL) 160 MG/5ML solution 650 mg, 650 mg, Per Tube, Q4H PRN, 650 mg at 07/01/20 1910 **OR** acetaminophen (TYLENOL) suppository 650 mg, 650 mg, Rectal, Q4H PRN, Costella, Vincent J, PA-C .  acetaminophen-codeine (TYLENOL #3) 300-30 MG per tablet 1-2 tablet, 1-2 tablet, Oral, Q4H PRN, Pham, Minh Q, RPH-CPP, 2 tablet at 07/08/20 2116 .  Chlorhexidine Gluconate Cloth 2 % PADS 6 each, 6 each, Topical, Daily, Consuella Lose, MD, 6 each at 07/09/20 1054 .  cyclobenzaprine (FLEXERIL) tablet 5-10 mg, 5-10 mg, Oral, TID PRN, Pham, Minh Q, RPH-CPP, 10 mg at 07/08/20 0807 .  docusate sodium (COLACE) capsule 100 mg, 100 mg, Oral, BID, Pham, Minh Q, RPH-CPP, 100 mg at 16/07/37 1062 .  folic acid (FOLVITE) tablet 1 mg, 1 mg, Per Tube, Daily, Pham, Minh Q, RPH-CPP, 1 mg at 07/09/20 1054 .  furosemide (LASIX) tablet 40 mg, 40 mg, Oral, Daily, Pham, Minh Q, RPH-CPP, 40 mg at 07/09/20 1054 .  HYDROmorphone (DILAUDID) injection 0.5 mg, 0.5 mg, Intravenous, Q2H PRN, Costella, Vincent J, PA-C, 0.5 mg at 07/09/20 1055 .  hydroxychloroquine (PLAQUENIL) tablet 200 mg, 200 mg, Oral, BID, Pham, Minh Q, RPH-CPP, 200 mg at 07/09/20 1054 .  insulin aspart (novoLOG) injection 0-15 Units, 0-15 Units, Subcutaneous, TID WC, Costella, Vista Mink, PA-C, 2 Units at 07/07/20 1256 .  insulin aspart (novoLOG) injection 0-5 Units, 0-5 Units, Subcutaneous, QHS, Costella, Vincent J, PA-C .  lactated ringers infusion, , Intravenous, Continuous, Roderic Palau, MD .  levETIRAcetam (KEPPRA) 100 MG/ML solution 500 mg, 500 mg, Oral, BID, Consuella Lose, MD, 500 mg at 07/09/20 1055 .  lidocaine (LIDODERM) 5 % 2 patch, 2 patch, Transdermal, Q24H, Bergman, Meghan D, NP, 2 patch at 07/08/20 2046 .  methocarbamol (ROBAXIN) tablet 750 mg, 750 mg,  Oral, QID, Costella, Vincent J, PA-C, 750 mg at 07/09/20 1053 .  methotrexate (RHEUMATREX) tablet 5 mg, 5 mg, Oral, Weekly, Pham, Minh Q, RPH-CPP .  metoprolol tartrate (LOPRESSOR) tablet 100 mg, 100 mg, Oral, BID, Pham, Minh Q, RPH-CPP, 100 mg at 07/09/20 1054 .  [DISCONTINUED] niMODipine (NIMOTOP) capsule 60 mg, 60 mg, Oral, Q4H, 60 mg at 07/03/20 2033 **OR** niMODipine (NYMALIZE) 6 MG/ML oral solution 60 mg, 60 mg, Oral, Q4H, Consuella Lose, MD, 60 mg at 07/09/20 1056 .  ondansetron (ZOFRAN-ODT) disintegrating tablet 4 mg, 4 mg, Oral, Q6H PRN **OR** ondansetron (ZOFRAN) injection 4 mg, 4 mg, Intravenous, Q6H PRN, Costella, Vincent J, PA-C, 4 mg at 07/08/20 1218 .  oxyCODONE (Oxy IR/ROXICODONE) immediate release tablet 5-10 mg, 5-10 mg, Oral, Q6H PRN, Consuella Lose, MD, 10 mg  at 07/07/20 0453 .  pantoprazole (PROTONIX) EC tablet 40 mg, 40 mg, Oral, Daily, 40 mg at 07/09/20 1055 **OR** [DISCONTINUED] pantoprazole sodium (PROTONIX) 40 mg/20 mL oral suspension 40 mg, 40 mg, Per Tube, Daily, Costella, Vincent J, PA-C, 40 mg at 07/02/20 0900 .  potassium chloride 20 MEQ/15ML (10%) solution 10 mEq, 10 mEq, Oral, Daily, Consuella Lose, MD, 10 mEq at 07/09/20 1055 .  sodium chloride flush (NS) 0.9 % injection 3 mL, 3 mL, Intravenous, Once, Costella, Vincent J, PA-C .  sodium chloride tablet 1 g, 1 g, Oral, TID WC, Eustace Moore, MD, 1 g at 07/09/20 1057  Patients Current Diet:  Diet Order            Diet regular Room service appropriate? Yes with Assist; Fluid consistency: Thin  Diet effective now                 Precautions / Restrictions Precautions Precautions: Fall,Other (comment) Precaution Comments: SBP < 140 Restrictions Weight Bearing Restrictions: No   Has the patient had 2 or more falls or a fall with injury in the past year?Yes  Prior Activity Level Limited Community (1-2x/wk): Pt. worked from home but was active in the community PTA  Prior Functional Level Prior  Function Level of Independence: Independent with assistive device(s) Comments: had been using a cane but had stopped. Had someone put on socks over weekend and will ask for help from family if having a "bad day"  Self Care: Did the patient need help bathing, dressing, using the toilet or eating?  Independent  Indoor Mobility: Did the patient need assistance with walking from room to room (with or without device)? Independent  Stairs: Did the patient need assistance with internal or external stairs (with or without device)? Independent  Functional Cognition: Did the patient need help planning regular tasks such as shopping or remembering to take medications? Independent  Home Assistive Devices / Equipment Home Assistive Devices/Equipment: None Home Equipment: [place] - 2 wheels,Cane - single point,Bedside commode  Prior Device Use: Indicate devices/aids used by the patient prior to current illness, exacerbation or injury? Filo  Current Functional Level Cognition  Arousal/Alertness: Lethargic Overall Cognitive Status: Impaired/Different from baseline Current Attention Level: Sustained Orientation Level: Oriented X4 Following Commands: Follows one step commands consistently,Follows one step commands with increased time,Follows multi-step commands inconsistently Safety/Judgement: Decreased awareness of safety General Comments: Significantly slowed processing, often requires multimodal cues and repetition to perform task. Attention: Sustained Sustained Attention: Impaired Sustained Attention Impairment: Verbal basic Memory:  (will assess further) Awareness: Impaired Awareness Impairment: Anticipatory impairment,Emergent impairment,Intellectual impairment Problem Solving:  (will assess when alertness improves) Safety/Judgment: Impaired    Extremity Assessment (includes Sensation/Coordination)  Upper Extremity Assessment: LUE deficits/detail LUE Deficits / Details: weakness LUE  Coordination: decreased fine motor  Lower Extremity Assessment: Defer to PT evaluation LLE Deficits / Details: states "if i sit on toilet too long my left leg goes numb" LLE Sensation: decreased light touch (distally)    ADLs  Overall ADL's : Needs assistance/impaired Eating/Feeding: Minimal assistance,Sitting Grooming: Oral care,Sitting,Set up,Supervision/safety,Cueing for sequencing Grooming Details (indicate cue type and reason): Increased time and cues for initating of tasks Upper Body Bathing: Moderate assistance Lower Body Bathing: Maximal assistance Lower Body Dressing: Moderate assistance,Sit to/from stand Lower Body Dressing Details (indicate cue type and reason): Mod A for initating donning socks and then support to bring BLEs towards EOB Toilet Transfer: Moderate assistance,+2 for physical assistance,+2 for safety/equipment,Ambulation,BSC,RW Toilet Transfer Details (indicate cue type and reason):  Mod A +2 to intiating power up and then sequence turn to recliner Toileting- Clothing Manipulation and Hygiene: Maximal assistance,Sit to/from stand Toileting - Clothing Manipulation Details (indicate cue type and reason): Max A for peri care Functional mobility during ADLs: Minimal assistance,Moderate assistance,+2 for physical assistance,+2 for safety/equipment,Rolling Dimiceli General ADL Comments: Pt presenting with decreased cognition, sequencing, balance, and strength.    Mobility  Overal bed mobility: Needs Assistance Bed Mobility: Supine to Sit Supine to sit: Max assist General bed mobility comments: Decreased initiation; assist for BLE's and trunk off edge of bed. Pt resistive    Transfers  Overall transfer level: Needs assistance Equipment used: Rolling Fabrizio (2 wheeled) Transfers: Sit to/from Stand Sit to Stand: Min assist,Mod assist Stand pivot transfers: Min assist,+2 physical assistance General transfer comment: When pt initiating, requiring minA to rise to stand from  edge of bed with increased time to rise. On subsequent stand from recliner, pt requiring modA and use of bed pad to scoot hips forward to edge of bed as she could not sequence to do so    Ambulation / Gait / Stairs / Wheelchair Mobility  Ambulation/Gait Ambulation/Gait assistance: +2 safety/equipment,Min assist Gait Distance (Feet): 60 Feet Assistive device: Rolling Gargus (2 wheeled) Gait Pattern/deviations: Step-through pattern,Decreased stride length,Drifts right/left General Gait Details: Pt with consistent left drift despite cues, minA for balance and [place] negotiation, close chair follow utilized. Requiring one seated rest break due to back spasm Gait velocity: decreased Gait velocity interpretation: <1.31 ft/sec, indicative of household ambulator    Posture / Balance Dynamic Sitting Balance Sitting balance - Comments: Posterior support due to pain Balance Overall balance assessment: Needs assistance Sitting-balance support: Feet supported,Bilateral upper extremity supported Sitting balance-Leahy Scale: Poor Sitting balance - Comments: Posterior support due to pain Postural control: Posterior lean Standing balance support: During functional activity,Bilateral upper extremity supported Standing balance-Leahy Scale: Poor Standing balance comment: Reliant on UE support on RW and minA.    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, museum for [employer]  Discharge Living Setting Plans for Discharge Living Setting: Patient's home Type of Home at Discharge: House Discharge Home Layout: Two level Alternate Level Stairs-Rails: Right,Left Alternate Level Stairs-Number of Steps: 14 Discharge Home Access: Level entry Discharge Bathroom Shower/Tub: Tub/shower unit,Walk-in shower Discharge Bathroom Toilet: Standard Discharge Bathroom Accessibility: Yes How Accessible: Accessible via [employer] Does the patient have any problems obtaining your medications?: No  Social/Family/Support Systems Patient Roles: Spouse Contact Information: [phone] Anticipated Caregiver: Kendra Opitz Anticipated Caregiver's Contact Information: [phone] Ability/Limitations of Caregiver: Can provide Min A Caregiver Availability: 24/7 Discharge Plan Discussed with Primary Caregiver: Yes Is Caregiver In Agreement with Plan?: Yes Does Caregiver/Family have Issues with Lodging/Transportation while Pt is in Rehab?: No   Goals Patient/Family Goal for Rehab: Min A to supervision with PT?OT; Supervision to Mod I with SLP Expected length of stay: 14-18 days Pt/Family Agrees to Admission and willing to participate: Yes Program Orientation Provided & Reviewed with Pt/Caregiver Including Roles  & Responsibilities: Yes   Decrease burden  of Care through IP rehab admission: Specialzed equipment needs, Decrease number of caregivers, Bowel and bladder program and Patient/family education   Possible need for SNF placement upon discharge: not anticipated   Patient Condition: This patient's medical and functional status has changed since the consult dated: 06/30/20 in which the Rehabilitation Physician determined and documented that the patient's condition is appropriate for intensive rehabilitative care in an inpatient rehabilitation facility. See "History of Present Illness" (above) for medical update.  Functional changes are: Currently up in chair at the bedside and requiring mod to max assist for mobility and ADLs. Patient's medical and functional status update has been discussed with the Rehabilitation physician and patient remains appropriate for inpatient rehabilitation. Will admit to inpatient rehab today.  Preadmission Screen Completed By:  [name], RN, 07/09/2020 12:37 PM ______________________________________________________________________   Discussed status with Dr. Dagoberto Ligas and Dr. Naaman Plummer on 07/09/20 at 1000 and received approval for admission today.  Admission Coordinator:  [name], time 1310/Date 07/09/20

## 2020-07-01 NOTE — Progress Notes (Signed)
  NEUROSURGERY PROGRESS NOTE   No issues overnight. Complains of HA and neck pain. No new N/T/W  EXAM:  BP 123/69   Pulse 78   Temp 98.3 F (36.8 C) (Oral)   Resp 11   Ht 5\' 4"  (1.626 m)   Wt 85.7 kg   LMP 12/22/2017   SpO2 99%   BMI 32.43 kg/m   Awake, alert, oriented  Speech fluent, appropriate  CN grossly intact  5/5 BUE/BLE  EVD functional. Bloody/CSF  Date POD PCO2 HCT BP  MCA ACA PCA OPHT SIPH VERT Basilar  3/29 mr     Right  Left   71  66   -46  -44   25  36   22  24   30   *   -29  -25     -58     IMPRESSION/PLAN 50 y.o. 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 Neuro intact. - continue supportive care, nimotop, keppra, therapy - continue EVD - TCDs for monitoring. Yesterday results reviewed and normal. - Elevated CBGs - no hx DM: A1C, SSI. Monitor - Hypokalemia: trend

## 2020-07-01 NOTE — Progress Notes (Signed)
Transcranial Doppler  Date POD PCO2 HCT BP  MCA ACA PCA OPHT SIPH VERT Basilar  3/29 mr     Right  Left   71  66   -46  -44   25  36   22  24   30   *   -29  -25     -43    3/30 RH     Right  Left   84  85   -18  -37   29  28   14  19   17  17    -33  -31     -40         Right  Left                                             Right  Left                                             Right  Left                                            Right  Left                                            Right  Left                                        MCA = Middle Cerebral Artery      OPHT = Opthalmic Artery     BASILAR = Basilar Artery   ACA = Anterior Cerebral Artery     SIPH = Carotid Siphon PCA = Posterior Cerebral Artery   VERT = Verterbral Artery                   Normal MCA = 62+\-12 ACA = 50+\-12 PCA = 42+\-23   * = Unable to insonate  03/30  RT [place] = 1.75  LT [place] = 2.02  Darlin Coco, RDMS, RVT

## 2020-07-01 NOTE — Progress Notes (Signed)
Physical Therapy Treatment Patient Details Name: Amy Barry MRN: [id number] DOB: 1970/06/09 Today's Date: 07/01/2020    History of Present Illness Pt is a 49 y.o. F admitted 3/28 with AMS and incomprehensible speech. 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 Significant PMH: HTN, lupus, heart murmur, cervical fusion.    PT Comments    Pt progressing towards her physical therapy goals; remains motivated to participate. Ambulating x 3 feet with a Havel and two person moderate assist. Further distance deferred due to pt dizziness, keeping eyes closed, and knee instability/mild buckle. BP sitting edge of bed 113/68, post mobility 125/69. Suspect continued good progress with adequate pain/BP control.    Follow Up Recommendations  CIR     Equipment Recommendations  3in1 (PT)    Recommendations for Other Services       Precautions / Restrictions Precautions Precautions: Fall;Other (comment) Precaution Comments: EVD, clamp prior to mobility; SBP < 140 Restrictions Weight Bearing Restrictions: No    Mobility  Bed Mobility Overal bed mobility: Needs Assistance Bed Mobility: Supine to Sit     Supine to sit: Mod assist     General bed mobility comments: ModA to pull trunk to upright and support head/neck    Transfers Overall transfer level: Needs assistance Equipment used: None Transfers: Sit to/from Stand Sit to Stand: Min assist;+2 physical assistance         General transfer comment: MinA + 2 to stand x 2 from edge of bed, good power up  Ambulation/Gait Ambulation/Gait assistance: Mod assist;+2 physical assistance Gait Distance (Feet): 3 Feet Assistive device: Rolling Deahl (2 wheeled) Gait Pattern/deviations: Step-through pattern;Decreased stride length Gait velocity: decreased Gait velocity interpretation: <1.8 ft/sec, indicate of risk for  recurrent falls General Gait Details: Pt with knee instability, mild knee buckle, requiring modA + 2 for balance and line management   Stairs             Wheelchair Mobility    Modified Rankin (Stroke Patients Only) Modified Rankin (Stroke Patients Only) Pre-Morbid Rankin Score: No symptoms Modified Rankin: Moderately severe disability     Balance Overall balance assessment: Needs assistance Sitting-balance support: Feet unsupported Sitting balance-Leahy Scale: Fair Sitting balance - Comments: supervision for safety   Standing balance support: Bilateral upper extremity supported Standing balance-Leahy Scale: Poor Standing balance comment: reliant on Blatter                            Cognition Arousal/Alertness: Awake/alert Behavior During Therapy: WFL for tasks assessed/performed Overall Cognitive Status: Impaired/Different from baseline Area of Impairment: Orientation                 Orientation Level: Disoriented to;Time             General Comments: Pt unsure of time of day, reporting dizziness during mobility, afterwards sitting up in chair stating, "am I still in [place]?" Very pleasant, following all commands      Exercises      General Comments        Pertinent Vitals/Pain Pain Assessment: Faces Faces Pain Scale: Hurts even more Pain Location: head Pain Descriptors / Indicators: Headache Pain Intervention(s): Limited activity within patient's tolerance;Monitored during session    Home Living                      Prior Function  PT Goals (current goals can now be found in the care plan section) Acute Rehab PT Goals Patient Stated Goal: to make head stop hurting ( my neck) PT Goal Formulation: With patient Time For Goal Achievement: 07/14/20 Potential to Achieve Goals: Good Progress towards PT goals: Progressing toward goals    Frequency    Min 4X/week      PT Plan Current plan remains  appropriate    Co-evaluation              AM-PAC PT "6 Clicks" Mobility   Outcome Measure  Help needed turning from your back to your side while in a flat bed without using bedrails?: A Lot Help needed moving from lying on your back to sitting on the side of a flat bed without using bedrails?: A Lot Help needed moving to and from a bed to a chair (including a wheelchair)?: A Little Help needed standing up from a chair using your arms (e.g., wheelchair or bedside chair)?: A Little Help needed to walk in hospital room?: A Lot Help needed climbing 3-5 steps with a railing? : Total 6 Click Score: 13    End of Session Equipment Utilized During Treatment: Gait belt Activity Tolerance: Patient tolerated treatment well Patient left: in chair;with call bell/phone within reach;with chair alarm set Nurse Communication: Mobility status PT Visit Diagnosis: Pain;Difficulty in walking, not elsewhere classified (R26.2) Pain - part of body:  (head)     Time: 1431-1453 PT Time Calculation (min) (ACUTE ONLY): 22 min  Charges:  $Therapeutic Activity: 8-22 mins                     Wyona Almas, PT, DPT [place] Pager [phone] Office [phone]    Deno Etienne 07/01/2020, 3:50 PM

## 2020-07-02 LAB — GLUCOSE, CAPILLARY
Glucose-Capillary: 131 mg/dL — ABNORMAL HIGH (ref 70–99)
Glucose-Capillary: 74 mg/dL (ref 70–99)
Glucose-Capillary: 79 mg/dL (ref 70–99)
Glucose-Capillary: 86 mg/dL (ref 70–99)

## 2020-07-02 NOTE — Progress Notes (Signed)
IP rehab admissions - patient continues on Nimotop and in the ICU setting.  Not medically ready yet for CIR admission.  We will continue to follow progress.  Call for questions.  [phone]

## 2020-07-02 NOTE — Progress Notes (Signed)
  Speech Language Pathology Treatment: Cognitive-Linquistic  Patient Details Name: Amy Barry MRN: [id number] DOB: 15-Mar-1971 Today's Date: 07/02/2020 Time: 9528-4132 SLP Time Calculation (min) (ACUTE ONLY): 30 min  Assessment / Plan / Recommendation Clinical Impression  Followed up for cognitive intervention. No apparent dysarthria exhibited this date or aphasia. Pt was able to self report baseline cognitive functioning prior to this admission. She states prior to admission, she had been having significant difficulty with memory, thought processing, and at times word finding abilities. 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 Her PCP had recommended an outpatient neurology consult which she had not yet gotten scheduled. Difficulties in thinking skills have worsened since admission. Verbal and expression and auditory comprehension remained largely intact SLP discussed strategies for improved short term and novel recall including environmental visual aids, graphic expression, and/or use of note pad on mobile phone. Continued intervention indicated to maximize cognitive recovery.     HPI HPI: Amy Barry is a 50 y.o. female with GERD, lupus, HTN, heart murmor who presented to the ED with altered mental status and incomprehensible speech. 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

## 2020-07-02 NOTE — Progress Notes (Signed)
Occupational Therapy Treatment Patient Details Name: Amy Barry MRN: [id number] DOB: Nov 01, 1970 Today's Date: 07/02/2020    History of present illness 50 y.o. F admitted 3/28 with AMS and incomprehensible speech. 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 Significant PMH: HTN, lupus, heart murmur, cervical fusion.   OT comments  Pt progressing towards established OT goals. Pt continues to present with decreased cognition, balance, strength, and activity tolerance impacting her safe performance of ADLs. Pt requiring Mod A for donning socks while sitting at EOB. Pt performing mobility to/from toilet with Min A +2 and RW. Noting buckling of R knee during mobility. Pt requiring increased cues throughout for problem solving; pt drowsy and lethargic. Pt reporting increased HA at the end of the session; notified RN. Despite pain and fatigue, pt motivated to participate. Continue to recommend dc to CIR and will continue to follow acutely as admitted.    Follow Up Recommendations  CIR    Equipment Recommendations  3 in 1 bedside commode    Recommendations for Other Services Rehab consult    Precautions / Restrictions Precautions Precautions: Fall;Other (comment) Precaution Comments: EVD, clamp prior to mobility; SBP < 140       Mobility Bed Mobility Overal bed mobility: Needs Assistance Bed Mobility: Supine to Sit     Supine to sit: Min assist;HOB elevated     General bed mobility comments: Min A for elevating trunk and then bringing L hip towards EOB    Transfers Overall transfer level: Needs assistance Equipment used: Rolling Mikhail (2 wheeled) Transfers: Sit to/from Stand Sit to Stand: Min assist;+2 safety/equipment         General transfer comment: Min A for gaining balance in standing. Cues for hand placement and weight shift forward    Balance Overall  balance assessment: Needs assistance Sitting-balance support: No upper extremity supported;Feet supported Sitting balance-Leahy Scale: Fair Sitting balance - Comments: supervision for safety   Standing balance support: Bilateral upper extremity supported;During functional activity Standing balance-Leahy Scale: Poor Standing balance comment: reliant on Mander                           ADL either performed or assessed with clinical judgement   ADL Overall ADL's : Needs assistance/impaired                     Lower Body Dressing: Moderate assistance;Sit to/from stand Lower Body Dressing Details (indicate cue type and reason): Mod A for initating donning socks and then support to bring BLEs towards EOB Toilet Transfer: Minimal assistance;+2 for safety/equipment;Ambulation;RW;Regular Toilet   Toileting- Clothing Manipulation and Hygiene: Minimal assistance;Sit to/from stand Toileting - Clothing Manipulation Details (indicate cue type and reason): MIn A for managing underwear and maintaing balance during peri care.     Functional mobility during ADLs: Minimal assistance;+2 for physical assistance;Rolling [place] General ADL Comments: Pt presenting with decreased balance and strength. pt lethargic from pain medication     Vision       Perception     Praxis      Cognition Arousal/Alertness: Lethargic Behavior During Therapy: WFL for tasks assessed/performed Overall Cognitive Status: Impaired/Different from baseline Area of Impairment: Problem solving;Safety/judgement                         Safety/Judgement: Decreased awareness of safety   Problem Solving: Slow  processing;Requires verbal cues General Comments: lethargic and sleepy. Closing her eyes and "resting". Requiring increased time for following commands.        Exercises     Shoulder Instructions       General Comments VSS on RA. Mother present throughout    Pertinent Vitals/  Pain       Pain Assessment: 0-10 Pain Score: 6  Pain Location: Head (4 at Neck) Pain Descriptors / Indicators: Headache Pain Intervention(s): Monitored during session;Limited activity within patient's tolerance;Repositioned  Home Living                                          Prior Functioning/Environment              Frequency  Min 2X/week        Progress Toward Goals  OT Goals(current goals can now be found in the care plan section)  Progress towards OT goals: Progressing toward goals  Acute Rehab OT Goals Patient Stated Goal: to make head stop hurting ( my neck) OT Goal Formulation: With patient Time For Goal Achievement: 07/14/20 Potential to Achieve Goals: Good ADL Goals Pt Will Perform Grooming: with set-up;sitting Pt Will Perform Upper Body Bathing: with set-up;sitting Pt Will Perform Lower Body Bathing: with min assist;sit to/from stand Pt Will Transfer to Toilet: with min assist;ambulating;bedside commode Additional ADL Goal #1: Pt will complete bed mobility supervision level as precursor to adls.  Plan Discharge plan remains appropriate    Co-evaluation    PT/OT/SLP Co-Evaluation/Treatment: Yes Reason for Co-Treatment: For patient/therapist safety;To address functional/ADL transfers   OT goals addressed during session: ADL's and self-care      AM-PAC OT "6 Clicks" Daily Activity     Outcome Measure   Help from another person eating meals?: A Little Help from another person taking care of personal grooming?: A Little Help from another person toileting, which includes using toliet, bedpan, or urinal?: A Lot Help from another person bathing (including washing, rinsing, drying)?: A Lot Help from another person to put on and taking off regular upper body clothing?: A Lot Help from another person to put on and taking off regular lower body clothing?: A Little 6 Click Score: 15    End of Session Equipment Utilized During Treatment:  Gait belt;Rolling Quigley  OT Visit Diagnosis: Unsteadiness on feet (R26.81);Muscle weakness (generalized) (M62.81);Pain   Activity Tolerance Patient tolerated treatment well   Patient Left in chair;with call bell/phone within reach;with chair alarm set;with family/visitor present   Nurse Communication Mobility status;Precautions        Time: 1126-1202 OT Time Calculation (min): 36 min  Charges: OT General Charges $OT Visit: 1 Visit OT Treatments $Self Care/Home Management : 8-22 mins  [place], OTR/L Acute Rehab Pager: [phone] Office: [place] 07/02/2020, 12:09 PM

## 2020-07-02 NOTE — Progress Notes (Signed)
  NEUROSURGERY PROGRESS NOTE   No issues overnight. Pt cont to report HA, neck stiffness. Otherwise no new complaints.  EXAM:  BP (!) 142/83   Pulse 68   Temp 98.5 F (36.9 C) (Oral)   Resp 13   Ht 5\' 4"  (1.626 m)   Wt 85.7 kg   LMP 12/22/2017   SpO2 100%   BMI 32.43 kg/m   Awake, alert, oriented  Speech fluent, appropriate  CN grossly intact  5/5 BUE/BLE  EVD in place, functional open at 25mmHg. Blood tinged CSF  IMPRESSION:  50 y.o. female SAH d# 4, initial angiogram negative. Neurologically stable.  PLAN: - Cont EVD at 54mmHg. Can attempt to wean over weekend - Cont Nimotop - TCD tomorrow - Plan on repeat angiogram next week.   Consuella Lose, MD [facility]

## 2020-07-02 NOTE — Progress Notes (Signed)
Physical Therapy Treatment Patient Details Name: Amy Barry MRN: [id number] DOB: March 23, 1971 Today's Date: 07/02/2020    History of Present Illness 50 y.o. F admitted 3/28 with AMS and incomprehensible speech. 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 Significant PMH: HTN, lupus, heart murmur, cervical fusion.    PT Comments    Pt making progress towards her PT goals through ambulating increased distances of short bedroom distances this date. However, she displays R quads weakness, as is evident by her R knee buckling and need for physical assistance during R stance to prevent it. MinAx2 for transfers and ambulating with RW this date. Pt limited by pain in head and neck and drowsiness. Educated pt on self stretches to upper trapezius and levator scapula muscles to decrease tension in neck. Will continue to follow acutely. Current recommendations remain appropriate.    Follow Up Recommendations  CIR     Equipment Recommendations  3in1 (PT)    Recommendations for Other Services       Precautions / Restrictions Precautions Precautions: Fall;Other (comment) Precaution Comments: EVD, clamp prior to mobility; SBP < 140 Restrictions Weight Bearing Restrictions: No    Mobility  Bed Mobility Overal bed mobility: Needs Assistance Bed Mobility: Supine to Sit     Supine to sit: Min assist;HOB elevated     General bed mobility comments: Min A for elevating trunk and then bringing L hip towards EOB    Transfers Overall transfer level: Needs assistance Equipment used: Rolling Michelini (2 wheeled) Transfers: Sit to/from Stand Sit to Stand: Min assist;+2 safety/equipment         General transfer comment: Min A for gaining balance in standing. Cues for hand placement and weight shift forward  Ambulation/Gait Ambulation/Gait assistance: Min assist;+2 physical  assistance;+2 safety/equipment Gait Distance (Feet): 18 Feet (x2 bouts) Assistive device: Rolling Boyson (2 wheeled) Gait Pattern/deviations: Step-through pattern;Decreased stride length;Decreased step length - right;Decreased step length - left Gait velocity: decreased Gait velocity interpretation: <1.8 ft/sec, indicate of risk for recurrent falls General Gait Details: Pt with knee instability on R with intermittent buckling, needing minAx2 for steadying pt and to provide physical assist superior to anterior knee to activate quads for knee extension in R stance.   Stairs             Wheelchair Mobility    Modified Rankin (Stroke Patients Only) Modified Rankin (Stroke Patients Only) Pre-Morbid Rankin Score: No symptoms Modified Rankin: Moderately severe disability     Balance Overall balance assessment: Needs assistance Sitting-balance support: No upper extremity supported;Feet supported Sitting balance-Leahy Scale: Fair Sitting balance - Comments: supervision for safety, poor dynamic balance with trying to donn socks, needing physical assistance to raise legs up to her hands   Standing balance support: During functional activity;Single extremity supported Standing balance-Leahy Scale: Poor Standing balance comment: Reliant on 1-2 UE support on RW for stability, but pt able to reach moderately off BOS to donn/doff underwear with 1 UE at times with external assist.                            Cognition Arousal/Alertness: Lethargic Behavior During Therapy: WFL for tasks assessed/performed Overall Cognitive Status: Impaired/Different from baseline Area of Impairment: Problem solving;Safety/judgement                         Safety/Judgement: Decreased awareness of  safety   Problem Solving: Slow processing;Requires verbal cues General Comments: lethargic and sleepy. Closing her eyes and "resting". Requiring increased time for following commands.       Exercises      General Comments General comments (skin integrity, edema, etc.): VSS on RA. Mother present throughout      Pertinent Vitals/Pain Pain Assessment: 0-10 Pain Score: 6  Pain Location: Head (4 at Neck) Pain Descriptors / Indicators: Headache Pain Intervention(s): Limited activity within patient's tolerance;Monitored during session;Repositioned;Utilized relaxation techniques;Other (comment) (gentle brief massage to neck muscles and educated pt on self stretches of upper traps and levator scaps)    Home Living                      Prior Function            PT Goals (current goals can now be found in the care plan section) Acute Rehab PT Goals Patient Stated Goal: to make head stop hurting ( my neck) PT Goal Formulation: With patient/family Time For Goal Achievement: 07/14/20 Potential to Achieve Goals: Good Progress towards PT goals: Progressing toward goals    Frequency    Min 4X/week      PT Plan Current plan remains appropriate    Co-evaluation PT/OT/SLP Co-Evaluation/Treatment: Yes Reason for Co-Treatment: For patient/therapist safety;To address functional/ADL transfers PT goals addressed during session: Mobility/safety with mobility;Balance;Proper use of DME OT goals addressed during session: ADL's and self-care      AM-PAC PT "6 Clicks" Mobility   Outcome Measure  Help needed turning from your back to your side while in a flat bed without using bedrails?: A Lot Help needed moving from lying on your back to sitting on the side of a flat bed without using bedrails?: A Lot Help needed moving to and from a bed to a chair (including a wheelchair)?: A Little Help needed standing up from a chair using your arms (e.g., wheelchair or bedside chair)?: A Little Help needed to walk in hospital room?: A Lot Help needed climbing 3-5 steps with a railing? : Total 6 Click Score: 13    End of Session Equipment Utilized During Treatment: Gait  belt Activity Tolerance: Patient tolerated treatment well Patient left: in chair;with call bell/phone within reach;with chair alarm set;with family/visitor present Nurse Communication: Mobility status PT Visit Diagnosis: Pain;Difficulty in walking, not elsewhere classified (R26.2) Pain - part of body:  (head)     Time: 1126-1202 PT Time Calculation (min) (ACUTE ONLY): 36 min  Charges:  $Gait Training: 8-22 mins                     Moishe Spice, PT, DPT Acute Rehabilitation Services  Pager: [phone] Office: [place] 07/02/2020, 2:02 PM

## 2020-07-03 ENCOUNTER — Inpatient Hospital Stay (HOSPITAL_COMMUNITY): Payer: No Typology Code available for payment source

## 2020-07-03 DIAGNOSIS — I609 Nontraumatic subarachnoid hemorrhage, unspecified: Secondary | ICD-10-CM

## 2020-07-03 LAB — GLUCOSE, CAPILLARY
Glucose-Capillary: 73 mg/dL (ref 70–99)
Glucose-Capillary: 96 mg/dL (ref 70–99)
Glucose-Capillary: 92 mg/dL (ref 70–99)
Glucose-Capillary: 114 mg/dL — ABNORMAL HIGH (ref 70–99)

## 2020-07-03 MED ORDER — POTASSIUM CHLORIDE 20 MEQ PO PACK: 20.0000 meq | PACK | Freq: Once | ORAL | Status: DC

## 2020-07-03 MED ORDER — FUROSEMIDE 40 MG PO TABS
40.0000 mg | ORAL_TABLET | Freq: Every day | ORAL | Status: DC
  Administered 2020-07-03 – 2020-07-09 (×7): 40 mg via ORAL
  Filled 2020-07-03 (×7): qty 1

## 2020-07-03 MED ORDER — CYCLOBENZAPRINE HCL 10 MG PO TABS
5.0000 mg | ORAL_TABLET | Freq: Three times a day (TID) | ORAL | Status: DC | PRN
  Administered 2020-07-07 – 2020-07-08 (×3): 10 mg via ORAL
  Filled 2020-07-03 (×4): qty 1

## 2020-07-03 MED ORDER — METHOTREXATE 2.5 MG PO TABS
5.0000 mg | ORAL_TABLET | ORAL | Status: DC
  Filled 2020-07-03: qty 2

## 2020-07-03 MED ORDER — DOCUSATE SODIUM 100 MG PO CAPS
100.0000 mg | ORAL_CAPSULE | Freq: Two times a day (BID) | ORAL | Status: DC
  Administered 2020-07-03 – 2020-07-07 (×7): 100 mg via ORAL
  Filled 2020-07-03 (×7): qty 1

## 2020-07-03 MED ORDER — POTASSIUM CHLORIDE CRYS ER 10 MEQ PO TBCR
10.0000 meq | EXTENDED_RELEASE_TABLET | Freq: Every day | ORAL | Status: DC
  Administered 2020-07-03 – 2020-07-08 (×6): 10 meq via ORAL
  Filled 2020-07-03 (×6): qty 1

## 2020-07-03 MED ORDER — METOPROLOL TARTRATE 50 MG PO TABS
100.0000 mg | ORAL_TABLET | Freq: Two times a day (BID) | ORAL | Status: DC
  Administered 2020-07-03 – 2020-07-09 (×13): 100 mg via ORAL
  Filled 2020-07-03 (×13): qty 2

## 2020-07-03 MED ORDER — OXYCODONE HCL 5 MG PO TABS
5.0000 mg | ORAL_TABLET | Freq: Four times a day (QID) | ORAL | Status: DC | PRN
  Administered 2020-07-03: 10 mg via ORAL
  Administered 2020-07-03: 5 mg via ORAL
  Administered 2020-07-04 – 2020-07-05 (×6): 10 mg via ORAL
  Administered 2020-07-06: 5 mg via ORAL
  Administered 2020-07-06 – 2020-07-07 (×4): 10 mg via ORAL
  Filled 2020-07-03 (×13): qty 2

## 2020-07-03 MED ORDER — LEVETIRACETAM 500 MG PO TABS
500.0000 mg | ORAL_TABLET | Freq: Two times a day (BID) | ORAL | Status: DC
  Administered 2020-07-03 – 2020-07-08 (×11): 500 mg via ORAL
  Filled 2020-07-03 (×11): qty 1

## 2020-07-03 MED ORDER — ACETAMINOPHEN-CODEINE #3 300-30 MG PO TABS
1.0000 | ORAL_TABLET | ORAL | Status: DC | PRN
  Administered 2020-07-03 – 2020-07-07 (×4): 2 via ORAL
  Administered 2020-07-07: 1 via ORAL
  Administered 2020-07-08 (×3): 2 via ORAL
  Filled 2020-07-03 (×6): qty 2
  Filled 2020-07-03: qty 1
  Filled 2020-07-03: qty 2

## 2020-07-03 MED ORDER — HYDROXYCHLOROQUINE SULFATE 200 MG PO TABS
200.0000 mg | ORAL_TABLET | Freq: Two times a day (BID) | ORAL | Status: DC
  Administered 2020-07-03 – 2020-07-09 (×11): 200 mg via ORAL
  Filled 2020-07-03 (×14): qty 1

## 2020-07-03 MED ORDER — POTASSIUM CHLORIDE CRYS ER 20 MEQ PO TBCR
20.0000 meq | EXTENDED_RELEASE_TABLET | Freq: Once | ORAL | Status: AC
  Administered 2020-07-03: 20 meq via ORAL
  Filled 2020-07-03: qty 1

## 2020-07-03 NOTE — Progress Notes (Signed)
Physical Therapy Treatment Patient Details Name: Amy Barry MRN: [id number] DOB: 05-02-1970 Today's Date: 07/03/2020    History of Present Illness 50 y.o. F admitted 3/28 with AMS and incomprehensible speech. 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 Significant PMH: HTN, lupus, heart murmur, cervical fusion.    PT Comments    Treatment limited by 10/10 headache pain this date. Pt demonstrating improved quads contraction during R stance, therefore decreased R knee buckling with gait, but she still needs minA for steadying and intermittent blocking of knee to prevent LOB. Pt needing modA to sit up EOB and minA to come to stand. Cleaned pt and redressed her while she was sitting on toilet as pt had a bout of emesis on herself in the bed right before PT arrived. Pt desired to remain on toilet to try to have bowel movement, thus educated and provided pt with bathroom alarm pull and pt's mother to call for help when she finishes. RN aware of pt location at end of session. Will continue to follow acutely. Current recommendations remain appropriate.     Follow Up Recommendations  CIR     Equipment Recommendations  3in1 (PT)    Recommendations for Other Services       Precautions / Restrictions Precautions Precautions: Fall;Other (comment) Precaution Comments: EVD, clamp prior to mobility; SBP < 140 Restrictions Weight Bearing Restrictions: No    Mobility  Bed Mobility Overal bed mobility: Needs Assistance Bed Mobility: Supine to Sit     Supine to sit: HOB elevated;Mod assist     General bed mobility comments: ModA with pt pulling up on PT to ascend trunk, cuing pt to reach for rail and manage legs off EOB.    Transfers Overall transfer level: Needs assistance Equipment used: Rolling [place] (2 wheeled) Transfers: Sit to/from Stand Sit to Stand: Min assist          General transfer comment: Min A for gaining balance in standing. Cues for hand placement and weight shift forward  Ambulation/Gait Ambulation/Gait assistance: Min assist Gait Distance (Feet): 18 Feet Assistive device: Rolling Platt (2 wheeled) Gait Pattern/deviations: Step-through pattern;Decreased stride length;Decreased step length - right;Decreased step length - left Gait velocity: decreased Gait velocity interpretation: <1.31 ft/sec, indicative of household ambulator General Gait Details: Pt with knee instability on R with intermittent buckling, needing minA for steadying pt and to provide physical assist superior to anterior knee to activate quads for knee extension in R stance intermittently. Improved R knee extension compared to prior session as pt maintaining extension more when verbally cued.   Stairs             Wheelchair Mobility    Modified Rankin (Stroke Patients Only) Modified Rankin (Stroke Patients Only) Pre-Morbid Rankin Score: No symptoms Modified Rankin: Moderately severe disability     Balance Overall balance assessment: Needs assistance Sitting-balance support: Feet supported;Bilateral upper extremity supported Sitting balance-Leahy Scale: Poor Sitting balance - Comments: UE support, likely due to headache pain and pt tendency to try to rest head on surfaces   Standing balance support: During functional activity;Bilateral upper extremity supported Standing balance-Leahy Scale: Poor Standing balance comment: Reliant on UE support on RW and minA.                            Cognition Arousal/Alertness: Lethargic Behavior During Therapy: WFL for tasks assessed/performed Overall Cognitive Status:  Impaired/Different from baseline Area of Impairment: Problem solving;Safety/judgement;Memory;Following commands;Attention                   Current Attention Level: Sustained Memory: Decreased short-term memory Following Commands:  Follows one step commands consistently;Follows one step commands with increased time;Follows multi-step commands inconsistently Safety/Judgement: Decreased awareness of safety   Problem Solving: Slow processing;Requires verbal cues;Difficulty sequencing;Decreased initiation;Requires tactile cues General Comments: lethargic and sleepy. Closing her eyes and "resting". Requiring increased time for following commands and repeated commands as pt easily distracted, likely due to pain. Pt needing increased cues this date for sequencing and initiation.      Exercises      General Comments        Pertinent Vitals/Pain Pain Assessment: 0-10 Pain Score: 10-Worst pain ever Pain Location: Head Pain Descriptors / Indicators: Headache;Throbbing;Sharp Pain Intervention(s): Limited activity within patient's tolerance;Monitored during session;Repositioned (RN aware)    Home Living                      Prior Function            PT Goals (current goals can now be found in the care plan section) Acute Rehab PT Goals Patient Stated Goal: to make head stop hurting PT Goal Formulation: With patient/family Time For Goal Achievement: 07/14/20 Potential to Achieve Goals: Good Progress towards PT goals: Progressing toward goals    Frequency    Min 4X/week      PT Plan Current plan remains appropriate    Co-evaluation              AM-PAC PT "6 Clicks" Mobility   Outcome Measure  Help needed turning from your back to your side while in a flat bed without using bedrails?: A Lot Help needed moving from lying on your back to sitting on the side of a flat bed without using bedrails?: A Lot Help needed moving to and from a bed to a chair (including a wheelchair)?: A Little Help needed standing up from a chair using your arms (e.g., wheelchair or bedside chair)?: A Little Help needed to walk in hospital room?: A Little Help needed climbing 3-5 steps with a railing? : Total 6 Click  Score: 14    End of Session Equipment Utilized During Treatment: Gait belt Activity Tolerance: Patient tolerated treatment well Patient left: with family/visitor present (on toilet with alarm line within reach, educated pt and her mother to call when she is done Investment banker, [employer] made aware of her position)`) Nurse Communication: Mobility status;Other (comment) (pt on toilet end of session with alarm pull ready and mother present) PT Visit Diagnosis: Pain;Difficulty in walking, not elsewhere classified (R26.2) Pain - part of body:  (head)     Time: 9678-9381 PT Time Calculation (min) (ACUTE ONLY): 33 min  Charges:  $Gait Training: 8-22 mins $Therapeutic Activity: 8-22 mins                     Moishe Spice, PT, DPT Acute Rehabilitation Services  Pager: [phone] Office: [place] 07/03/2020, 3:03 PM

## 2020-07-03 NOTE — Progress Notes (Signed)
  NEUROSURGERY PROGRESS NOTE   No issues overnight. Cont to have HA.  EXAM:  BP (!) 164/91   Pulse 76   Temp 98.6 F (37 C) (Oral)   Resp (!) 9   Ht 5\' 4"  (1.626 m)   Wt 85.7 kg   LMP 12/22/2017   SpO2 98%   BMI 32.43 kg/m   Awake, alert, oriented  Speech fluent, appropriate  CN grossly intact  5/5 BUE/BLE  EVD in place, functional, open @ 27mm  IMPRESSION:  50 y.o. female SAH d#5, angio negative. Remains neurologically stable.  PLAN: - Cont EVD drainage for now. Can begin to wean/clamp over the weekend with plans to remove if not shunt dependent on Monday - Repeat angiogram scheduled on Monday afternoon - TCD today - change IV dilaudid to OxyIR 5-10mg  q6hrs PRN   Consuella Lose, MD [facility]

## 2020-07-03 NOTE — Progress Notes (Signed)
Inpatient Rehab Admissions Coordinator:  Saw pt and mother at bedside. Pt is not medically ready for potential CIR admission.  Will continue to follow.    Gayland Curry, [place], [place] Admissions Coordinator [phone]

## 2020-07-03 NOTE — Progress Notes (Signed)
K+ 3.4 on 3/31. Ok to give KCL 53meq PO x1 per Dr Kathyrn Sheriff.  Onnie Boer, PharmD, BCIDP, AAHIVP, CPP Infectious Disease Pharmacist 07/03/2020 8:00 AM

## 2020-07-03 NOTE — Progress Notes (Signed)
Transcranial Doppler  Date POD PCO2 HCT BP  MCA ACA PCA OPHT SIPH VERT Basilar  3/29 mr     Right  Left   71  66   -46  -44   25  36   22  24   30   *   -29  -25     -43    3/30 RH     Right  Left   84  85   -18  -37   29  28   14  19   17  17    -33  -31     -40    4/1 MR     Right  Left   103  37   -31  -24   25  26   14  13    90  31   *  *   *            Right  Left                                             Right  Left                                            Right  Left                                            Right  Left                                        MCA = Middle Cerebral Artery      OPHT = Opthalmic Artery     BASILAR = Basilar Artery   ACA = Anterior Cerebral Artery     SIPH = Carotid Siphon PCA = Posterior Cerebral Artery   VERT = Verterbral Artery                   Normal MCA = 62+\-12 ACA = 50+\-12 PCA = 42+\-23   * = Unable to insonate  4/1  RT Lindegaard = 3.9  LT Lindegaard = not insonated  Tyjon Bowen 07/03/2020 9:16 AM

## 2020-07-04 LAB — BASIC METABOLIC PANEL
Anion gap: 12 (ref 5–15)
BUN: 8 mg/dL (ref 6–20)
CO2: 21 mmol/L — ABNORMAL LOW (ref 22–32)
Calcium: 8.7 mg/dL — ABNORMAL LOW (ref 8.9–10.3)
Chloride: 96 mmol/L — ABNORMAL LOW (ref 98–111)
Creatinine, Ser: 0.65 mg/dL (ref 0.44–1.00)
GFR, Estimated: >60 mL/min (ref >60)
Glucose, Bld: 105 mg/dL — ABNORMAL HIGH (ref 70–99)
Potassium: 4.2 mmol/L (ref 3.5–5.1)
Sodium: 129 mmol/L — ABNORMAL LOW (ref 135–145)

## 2020-07-04 LAB — GLUCOSE, CAPILLARY
Glucose-Capillary: 91 mg/dL (ref 70–99)
Glucose-Capillary: 113 mg/dL — ABNORMAL HIGH (ref 70–99)
Glucose-Capillary: 125 mg/dL — ABNORMAL HIGH (ref 70–99)
Glucose-Capillary: 133 mg/dL — ABNORMAL HIGH (ref 70–99)

## 2020-07-04 MED ORDER — DEXAMETHASONE SODIUM PHOSPHATE 4 MG/ML IJ SOLN
4.0000 mg | Freq: Four times a day (QID) | INTRAMUSCULAR | Status: AC
  Administered 2020-07-04 – 2020-07-05 (×4): 4 mg via INTRAVENOUS
  Filled 2020-07-04 (×3): qty 1

## 2020-07-04 MED ORDER — SODIUM CHLORIDE 1 G PO TABS
1.0000 g | ORAL_TABLET | Freq: Three times a day (TID) | ORAL | Status: DC
  Administered 2020-07-04 – 2020-07-09 (×16): 1 g via ORAL
  Filled 2020-07-04 (×17): qty 1

## 2020-07-04 MED ORDER — NIMODIPINE 6 MG/ML PO SOLN
60.0000 mg | ORAL | Status: DC
  Administered 2020-07-04 – 2020-07-09 (×31): 60 mg via ORAL
  Filled 2020-07-04 (×25): qty 10

## 2020-07-04 NOTE — Plan of Care (Signed)
Pt A/O x3. Forgetful.And sometimes asking questions that require reorientation and reassurance.  At times irritable. Family at the bedside, supportive, anxious and in need to explain things in details. Bed in low position, alarms are on, call bell in reach, EVD as ordered, draining. All questions are answered at this time.

## 2020-07-04 NOTE — Progress Notes (Signed)
Subjective: Patient reports significant headache.  It actually felt a little worse when I lowered her drain to drain some CSF.  She has neck pain also.  It does radiate down to the low back but not into the legs.  Otherwise no visual changes or numbness tingling or weakness and she is feeding herself applesauce at this time.  Objective: Vital signs in last 24 hours: Temp:  [98.3 F (36.8 C)-99.5 F (37.5 C)] 98.3 F (36.8 C) (04/02 0400) Pulse Rate:  [39-78] 76 (04/02 0800) Resp:  [7-16] 16 (04/02 0800) BP: (126-174)/(69-106) 156/94 (04/02 0800) SpO2:  [97 %-100 %] 100 % (04/02 0800)  Intake/Output from previous day: 04/01 0701 - 04/02 0700 In: 2392.8 [I.V.:2292.8; IV Piggyback:100] Out: 2831 [Urine:2500; Drains:331] Intake/Output this shift: Total I/O In: 99.8 [I.V.:99.8] Out: 16 [Drains:16]  Neurologic: Grossly normal, ventricular's patent at 20 and ICP is running just under 20  Lab Results: Lab Results  Component Value Date   WBC 6.9 06/29/2020   HGB 15.0 06/29/2020   HCT 44.0 06/29/2020   MCV 95.6 06/29/2020   PLT 225 06/29/2020   Lab Results  Component Value Date   INR 1.2 06/29/2020   BMET Lab Results  Component Value Date   NA 129 (L) 07/04/2020   K 4.2 07/04/2020   CL 96 (L) 07/04/2020   CO2 21 (L) 07/04/2020   GLUCOSE 105 (H) 07/04/2020   BUN 8 07/04/2020   CREATININE 0.65 07/04/2020   CALCIUM 8.7 (L) 07/04/2020    Studies/Results: VAS [name] TRANSCRANIAL DOPPLER  Result Date: 07/03/2020  Transcranial Doppler Indications: Subarachnoid hemorrhage. Limitations: patient positioning & pain tolerane Limitations for diagnostic windows: Unable to insonate occipital window. Comparison Study: previous done 3/30 Performing Technologist: Abram Sander RVS  Examination Guidelines: A complete evaluation includes B-mode imaging, spectral Doppler, color Doppler, and power Doppler as needed of all accessible portions of each vessel. Bilateral testing is considered an integral  part of a complete examination. Limited examinations for reoccurring indications may be performed as noted.  +----------+-------------+----------+-----------+-------------+ RIGHT TCD Right VM (cm)Depth (cm)Pulsatility   Comment    +----------+-------------+----------+-----------+-------------+ MCA          103.00       5.20      0.96                  +----------+-------------+----------+-----------+-------------+ ACA          -31.00                 0.83                  +----------+-------------+----------+-----------+-------------+ Term ICA      69.00                 1.08                  +----------+-------------+----------+-----------+-------------+ PCA           25.00                 1.09                  +----------+-------------+----------+-----------+-------------+ Opthalmic     14.00                 1.74                  +----------+-------------+----------+-----------+-------------+ ICA siphon    90.00  0.70                  +----------+-------------+----------+-----------+-------------+ Vertebral                                   not insonated +----------+-------------+----------+-----------+-------------+ Distal ICA    26.00                 0.84                  +----------+-------------+----------+-----------+-------------+  +----------+------------+----------+-----------+-------------+ LEFT TCD  Left VM (cm)Depth (cm)Pulsatility   Comment    +----------+------------+----------+-----------+-------------+ MCA          37.00                 1.01                  +----------+------------+----------+-----------+-------------+ ACA          -24.00                0.88                  +----------+------------+----------+-----------+-------------+ Term ICA     45.00                 0.88                  +----------+------------+----------+-----------+-------------+ PCA          26.00                 0.90                   +----------+------------+----------+-----------+-------------+ Opthalmic    13.00                 1.73                  +----------+------------+----------+-----------+-------------+ ICA siphon   31.00                 1.25                  +----------+------------+----------+-----------+-------------+ Vertebral                                  not insonated +----------+------------+----------+-----------+-------------+ Distal ICA                                 not insonated +----------+------------+----------+-----------+-------------+  +------------+-------+-------------+             VM cm/s   Comment    +------------+-------+-------------+ Prox Basilar       not insonated +------------+-------+-------------+ +----------------------+---+ Right Lindegaard Ratio3.9 +----------------------+---+  Summary:  Elevated bilateral middle cerebral and right carotid siphon mean flow velocities suggest mild vasospasm. Absent suboccpital window limits evaluation of posterior circulation vessels. *See table(s) above for TCD measurements and observations.  Diagnosing physician: Antony Contras MD Electronically signed by Antony Contras MD on 07/03/2020 at 11:31:41 AM.    Final     Assessment/Plan: 1. Mild spasm - HHH 2. Hyponatremia - add salt 3. Angio Monday 4. Hydro - continue ventric at current height   Estimated body mass index is 32.43 kg/m as calculated from the following:   Height as of this encounter: 5\' 4"  (1.626 m).   Weight as of this encounter:  85.7 kg.    LOS: 5 days    Eustace Moore 07/04/2020, 8:58 AM

## 2020-07-05 LAB — BASIC METABOLIC PANEL
Anion gap: 10 (ref 5–15)
BUN: 8 mg/dL (ref 6–20)
CO2: 20 mmol/L — ABNORMAL LOW (ref 22–32)
Calcium: 8.8 mg/dL — ABNORMAL LOW (ref 8.9–10.3)
Chloride: 101 mmol/L (ref 98–111)
Creatinine, Ser: 0.62 mg/dL (ref 0.44–1.00)
GFR, Estimated: >60 mL/min (ref >60)
Glucose, Bld: 99 mg/dL (ref 70–99)
Potassium: 4.5 mmol/L (ref 3.5–5.1)
Sodium: 131 mmol/L — ABNORMAL LOW (ref 135–145)

## 2020-07-05 LAB — GLUCOSE, CAPILLARY
Glucose-Capillary: 123 mg/dL — ABNORMAL HIGH (ref 70–99)
Glucose-Capillary: 123 mg/dL — ABNORMAL HIGH (ref 70–99)
Glucose-Capillary: 112 mg/dL — ABNORMAL HIGH (ref 70–99)
Glucose-Capillary: 110 mg/dL — ABNORMAL HIGH (ref 70–99)

## 2020-07-05 NOTE — Progress Notes (Signed)
She looks good today.  The Decadron helped her headache.  She does have some headache today.  The ventriculostomy is draining 9 to 15 cc an hour at 20 cm.  Therefore I dropped it to 15 cm.  She still needs the ventriculostomy.  She is awake and alert moving everything.  She is eating.  Plan is for angiogram tomorrow.  Continue triple H therapy for mild vasospasm.  Continue ventriculostomy drain.

## 2020-07-06 ENCOUNTER — Inpatient Hospital Stay (HOSPITAL_COMMUNITY): Payer: No Typology Code available for payment source

## 2020-07-06 ENCOUNTER — Other Ambulatory Visit (HOSPITAL_COMMUNITY): Payer: No Typology Code available for payment source

## 2020-07-06 HISTORY — PX: IR ANGIO INTRA EXTRACRAN SEL COM CAROTID INNOMINATE BILAT MOD SED: IMG5360

## 2020-07-06 HISTORY — PX: IR ANGIO VERTEBRAL SEL VERTEBRAL BILAT MOD SED: IMG5369

## 2020-07-06 LAB — BASIC METABOLIC PANEL
Anion gap: 9 (ref 5–15)
BUN: 8 mg/dL (ref 6–20)
CO2: 23 mmol/L (ref 22–32)
Calcium: 8.7 mg/dL — ABNORMAL LOW (ref 8.9–10.3)
Chloride: 100 mmol/L (ref 98–111)
Creatinine, Ser: 0.61 mg/dL (ref 0.44–1.00)
GFR, Estimated: >60 mL/min (ref >60)
Glucose, Bld: 104 mg/dL — ABNORMAL HIGH (ref 70–99)
Potassium: 3.5 mmol/L (ref 3.5–5.1)
Sodium: 132 mmol/L — ABNORMAL LOW (ref 135–145)

## 2020-07-06 LAB — GLUCOSE, CAPILLARY
Glucose-Capillary: 98 mg/dL (ref 70–99)
Glucose-Capillary: 113 mg/dL — ABNORMAL HIGH (ref 70–99)
Glucose-Capillary: 105 mg/dL — ABNORMAL HIGH (ref 70–99)
Glucose-Capillary: 110 mg/dL — ABNORMAL HIGH (ref 70–99)

## 2020-07-06 MED ORDER — LIDOCAINE HCL 1 % IJ SOLN
INTRAMUSCULAR | Status: AC
  Filled 2020-07-06: qty 20

## 2020-07-06 MED ORDER — FENTANYL CITRATE (PF) 100 MCG/2ML IJ SOLN
INTRAMUSCULAR | Status: AC | PRN
  Administered 2020-07-06: 25 ug via INTRAVENOUS

## 2020-07-06 MED ORDER — MIDAZOLAM HCL 2 MG/2ML IJ SOLN
INTRAMUSCULAR | Status: AC | PRN
  Administered 2020-07-06: 1 mg via INTRAVENOUS

## 2020-07-06 MED ORDER — MIDAZOLAM HCL 2 MG/2ML IJ SOLN
INTRAMUSCULAR | Status: AC
  Filled 2020-07-06: qty 2

## 2020-07-06 MED ORDER — IOHEXOL 300 MG/ML  SOLN
150.0000 mL | Freq: Once | INTRAMUSCULAR | Status: AC | PRN
  Administered 2020-07-06: 115 mL via INTRA_ARTERIAL

## 2020-07-06 MED ORDER — FENTANYL CITRATE (PF) 100 MCG/2ML IJ SOLN
INTRAMUSCULAR | Status: AC
  Filled 2020-07-06: qty 2

## 2020-07-06 MED ORDER — LIDOCAINE HCL (PF) 1 % IJ SOLN
INTRAMUSCULAR | Status: AC | PRN
  Administered 2020-07-06: 30 mL

## 2020-07-06 NOTE — Brief Op Note (Signed)
  NEUROSURGERY BRIEF OPERATIVE  NOTE   PREOP DX: Subarachnoid Hemorrhage  POSTOP DX: Same  PROCEDURE: Diagnostic cerebral angiogram  SURGEON: Dr. Consuella Lose, MD  ANESTHESIA: IV Sedation with Local  EBL: Minimal  SPECIMENS: None  COMPLICATIONS: None  CONDITION: Stable to recovery  FINDINGS (Full report in [facility]): 1. No aneurysms, AVM, or fistula 2. Mild vasospasm of the supraclinoid ICA bilaterally   Consuella Lose, MD [facility]

## 2020-07-06 NOTE — Progress Notes (Signed)
Per Kathyrn Sheriff, EVD clamped at 0800 and NS deceased to 45mL/hr

## 2020-07-06 NOTE — Progress Notes (Signed)
Physical Therapy Treatment Patient Details Name: Amy Barry MRN: [id number] DOB: September 13, 1970 Today's Date: 07/06/2020    History of Present Illness 50 y.o. F admitted 3/28 with AMS and incomprehensible speech. 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 Significant PMH: HTN, lupus, heart murmur, cervical fusion.    PT Comments    Pt still agreeable and motivated to participate today, however, is requiring increased time/effort for all aspects of mobility. Pt reporting hallucinations of family members not being present (RN aware) and is internally distracted by pain. Requiring two person mod-max assist for bed mobility and transfers; ambulating x 10 feet with a [place] and min assist (+2 for equipment). BP 144/88 post mobility. Continue to recommend comprehensive inpatient rehab (CIR) for post-acute therapy needs.     Follow Up Recommendations  CIR     Equipment Recommendations  3in1 (PT)    Recommendations for Other Services       Precautions / Restrictions Precautions Precautions: Fall;Other (comment) Precaution Comments: EVD, clamp prior to mobility; SBP < 140 Restrictions Weight Bearing Restrictions: No    Mobility  Bed Mobility Overal bed mobility: Needs Assistance Bed Mobility: Supine to Sit     Supine to sit: Max assist;+2 for physical assistance     General bed mobility comments: MaxA + 2 for progressing to edge of bed, multimodal cues for initiation and sequencing, assist to bring LLE off edge of bed, trunk/neck support, and use of pad to scoot hips forward to edge    Transfers Overall transfer level: Needs assistance Equipment used: Rolling [place] (2 wheeled) Transfers: Sit to/from Stand Sit to Stand: Mod assist;+2 physical assistance         General transfer comment: ModA + 2 for initiation from edge of bed and BSC. Increased  time/effort  Ambulation/Gait Ambulation/Gait assistance: Min assist;+2 safety/equipment Gait Distance (Feet): 10 Feet Assistive device: Rolling [place] (2 wheeled) Gait Pattern/deviations: Step-through pattern;Decreased stride length Gait velocity: decreased   General Gait Details: Cues for sequencing/direction, minA for balance (+2 equipment)   Stairs             Wheelchair Mobility    Modified Rankin (Stroke Patients Only) Modified Rankin (Stroke Patients Only) Pre-Morbid Rankin Score: No symptoms Modified Rankin: Moderately severe disability     Balance Overall balance assessment: Needs assistance Sitting-balance support: Feet supported;Bilateral upper extremity supported Sitting balance-Leahy Scale: Poor Sitting balance - Comments: Posterior support due to pain   Standing balance support: During functional activity;Bilateral upper extremity supported Standing balance-Leahy Scale: Poor Standing balance comment: Reliant on UE support on RW and minA.                            Cognition Arousal/Alertness: Awake/alert Behavior During Therapy: WFL for tasks assessed/performed Overall Cognitive Status: Impaired/Different from baseline Area of Impairment: Problem solving;Safety/judgement;Memory;Following commands;Attention                   Current Attention Level: Sustained Memory: Decreased short-term memory Following Commands: Follows one step commands consistently;Follows one step commands with increased time;Follows multi-step commands inconsistently Safety/Judgement: Decreased awareness of safety   Problem Solving: Slow processing;Requires verbal cues;Difficulty sequencing;Decreased initiation;Requires tactile cues General Comments: Pt reporting hallucinations of family members not being there; RN aware. Pt requiring significantly increased time for all sequencing/problem solving today and multimodal cues for motor initiation. Also internally  distracted by pain.  Exercises      General Comments        Pertinent Vitals/Pain Pain Assessment: Faces Faces Pain Scale: Hurts even more Pain Location: Head, neck, buttocks, hips Pain Descriptors / Indicators: Headache;Throbbing;Sharp;Aching Pain Intervention(s): Limited activity within patient's tolerance;Monitored during session;Repositioned    Home Living                      Prior Function            PT Goals (current goals can now be found in the care plan section) Acute Rehab PT Goals Patient Stated Goal: less pain PT Goal Formulation: With patient/family Time For Goal Achievement: 07/14/20 Potential to Achieve Goals: Good    Frequency    Min 4X/week      PT Plan Current plan remains appropriate    Co-evaluation              AM-PAC PT "6 Clicks" Mobility   Outcome Measure  Help needed turning from your back to your side while in a flat bed without using bedrails?: A Lot Help needed moving from lying on your back to sitting on the side of a flat bed without using bedrails?: Total Help needed moving to and from a bed to a chair (including a wheelchair)?: A Little Help needed standing up from a chair using your arms (e.g., wheelchair or bedside chair)?: A Lot Help needed to walk in hospital room?: A Little Help needed climbing 3-5 steps with a railing? : Total 6 Click Score: 12    End of Session Equipment Utilized During Treatment: Gait belt Activity Tolerance: Patient tolerated treatment well Patient left: with family/visitor present;in chair;with call bell/phone within reach;with chair alarm set Nurse Communication: Mobility status PT Visit Diagnosis: Pain;Difficulty in walking, not elsewhere classified (R26.2) Pain - part of body:  (head)     Time: 1157-2620 PT Time Calculation (min) (ACUTE ONLY): 42 min  Charges:  $Therapeutic Activity: 23-37 mins                     Wyona Almas, PT, DPT [place] Pager [phone] Office [place] 07/06/2020, 12:28 PM

## 2020-07-06 NOTE — Progress Notes (Signed)
   07/06/20 1358  RLE Neurovascular Assessment  R Posterior Tibial Pulse +1  R Dorsalis Pedis Pulse +2  LLE Neurovascular Assessment  L Posterior Tibial Pulse +1  L Dorsalis Pedis Pulse +2   Pulses palpated prior to diagnostic angiogram.

## 2020-07-06 NOTE — Progress Notes (Signed)
VASCULAR LAB     Attempted TCD, however, patient in IR at 14:20   Diana Armijo, RVT 07/06/2020, 2:22 PM

## 2020-07-06 NOTE — Progress Notes (Signed)
Occupational Therapy Treatment Patient Details Name: Amy Barry MRN: [id number] DOB: [birth year] Today's Date: 07/06/2020    History of present illness 50 y.o. F admitted 3/28 with AMS and incomprehensible speech. 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 Significant PMH: HTN, lupus, heart murmur, cervical fusion.   OT comments  Pt presenting this session with decreased cognition, problem solving, and balance compared to prior sessions. Pt requiring increased cues for initiating tasks and mobility. Pt reporting she has been having visual hallucinations of family members being in the room which are not there. Pt requiring Mod A +2 for functional transfers and Max A for peri care with toileting. Continue to highly recommend dc to CIR and will continue to follow acutely as admitted.    Follow Up Recommendations  CIR    Equipment Recommendations  3 in 1 bedside commode    Recommendations for Other Services Rehab consult    Precautions / Restrictions Precautions Precautions: Fall;Other (comment) Precaution Comments: EVD, clamp prior to mobility; SBP < 140 Restrictions Weight Bearing Restrictions: No       Mobility Bed Mobility Overal bed mobility: Needs Assistance Bed Mobility: Supine to Sit     Supine to sit: Max assist;+2 for physical assistance     General bed mobility comments: MaxA + 2 for progressing to edge of bed, multimodal cues for initiation and sequencing, assist to bring LLE off edge of bed, trunk/neck support, and use of pad to scoot hips forward to edge    Transfers Overall transfer level: Needs assistance Equipment used: Rolling [place] (2 wheeled) Transfers: Sit to/from Stand Sit to Stand: Mod assist;+2 physical assistance         General transfer comment: ModA + 2 for initiation from edge of bed and BSC. Increased time/effort    Balance  Overall balance assessment: Needs assistance Sitting-balance support: Feet supported;Bilateral upper extremity supported Sitting balance-Leahy Scale: Poor Sitting balance - Comments: Posterior support due to pain   Standing balance support: During functional activity;Bilateral upper extremity supported Standing balance-Leahy Scale: Poor Standing balance comment: Reliant on UE support on RW and minA.                           ADL either performed or assessed with clinical judgement   ADL Overall ADL's : Needs assistance/impaired     Grooming: Oral care;Sitting;Set up;Supervision/safety;Cueing for sequencing Grooming Details (indicate cue type and reason): Increased time and cues for initating of tasks                 Toilet Transfer: Moderate assistance;+2 for physical assistance;+2 for safety/equipment;Ambulation;BSC;RW Toilet Transfer Details (indicate cue type and reason): Mod A +2 to intiating power up and then sequence turn to recliner Toileting- Clothing Manipulation and Hygiene: Maximal assistance;Sit to/from stand Toileting - Clothing Manipulation Details (indicate cue type and reason): Max A for peri care     Functional mobility during ADLs: Minimal assistance;Moderate assistance;+2 for physical assistance;+2 for safety/equipment;Rolling [place]ives General ADL Comments: Pt presenting with decreased cognition, sequencing, balance, and strength.     Vision       Perception     Praxis      Cognition Arousal/Alertness: Awake/alert Behavior During Therapy: WFL for tasks assessed/performed Overall Cognitive Status: Impaired/Different from baseline Area of Impairment: Problem solving;Safety/judgement;Memory;Following commands;Attention;Awareness  Current Attention Level: Sustained Memory: Decreased short-term memory Following Commands: Follows one step commands with increased time;Follows multi-step commands inconsistently;Follows  one step commands inconsistently Safety/Judgement: Decreased awareness of safety Awareness: Intellectual;Emergent Problem Solving: Slow processing;Requires verbal cues;Difficulty sequencing;Decreased initiation;Requires tactile cues General Comments: Pt reporting hallucinations of family members not being there; RN aware. Pt requiring significantly increased time for all sequencing/problem solving today and multimodal cues for motor initiation. Also internally distracted by pain. Pt with difficulty initating tasks and sequencing.        Exercises Exercises: Other exercises Other Exercises Other Exercises: Lateral cervical stretched with manual therapy; x10 in sitting   Shoulder Instructions       General Comments Mother present throughout. VSS on RA    Pertinent Vitals/ Pain       Pain Assessment: Faces Faces Pain Scale: Hurts even more Pain Location: Head, neck, buttocks, hips Pain Descriptors / Indicators: Headache;Throbbing;Sharp;Aching Pain Intervention(s): Monitored during session;Limited activity within patient's tolerance;Repositioned;Relaxation  Home Living                                          Prior Functioning/Environment              Frequency  Min 2X/week        Progress Toward Goals  OT Goals(current goals can now be found in the care plan section)  Progress towards OT goals: Not progressing toward goals - comment (Change in cognition and functional performance)  Acute Rehab OT Goals Patient Stated Goal: less pain OT Goal Formulation: With patient Time For Goal Achievement: 07/14/20 Potential to Achieve Goals: Good ADL Goals Pt Will Perform Grooming: with set-up;sitting Pt Will Perform Upper Body Bathing: with set-up;sitting Pt Will Perform Lower Body Bathing: with min assist;sit to/from stand Pt Will Transfer to Toilet: with min assist;ambulating;bedside commode Additional ADL Goal #1: Pt will complete bed mobility supervision  level as precursor to adls.  Plan Discharge plan remains appropriate    Co-evaluation    PT/OT/SLP Co-Evaluation/Treatment: Yes Reason for Co-Treatment: Other (comment);For patient/therapist safety;To address functional/ADL transfers (For pain management)   OT goals addressed during session: ADL's and self-care      AM-PAC OT "6 Clicks" Daily Activity     Outcome Measure   Help from another person eating meals?: A Little Help from another person taking care of personal grooming?: A Little Help from another person toileting, which includes using toliet, bedpan, or urinal?: A Lot Help from another person bathing (including washing, rinsing, drying)?: A Lot Help from another person to put on and taking off regular upper body clothing?: A Lot Help from another person to put on and taking off regular lower body clothing?: A Little 6 Click Score: 15    End of Session Equipment Utilized During Treatment: Gait belt;Rolling [place]  OT Visit Diagnosis: Unsteadiness on feet (R26.81);Muscle weakness (generalized) (M62.81);Pain   Activity Tolerance Patient tolerated treatment well   Patient Left in chair;with call bell/phone within reach;with chair alarm set;with family/visitor present   Nurse Communication Mobility status;Precautions;Other (comment) (Change in cognition compared to prior sessions)        Time: 8182-9937 OT Time Calculation (min): 32 min  Charges: OT General Charges $OT Visit: 1 Visit OT Treatments $Self Care/Home Management : 8-22 mins  [place], OTR/L Acute Rehab Pager: [phone] Office: [place] 07/06/2020, 1:08 PM

## 2020-07-06 NOTE — Progress Notes (Signed)
  NEUROSURGERY PROGRESS NOTE   Pt seen and examined. No issues overnight. Cont to have HA.  EXAM: Temp:  [98.4 F (36.9 C)-99.3 F (37.4 C)] 99 F (37.2 C) (04/04 1200) Pulse Rate:  [61-82] 75 (04/04 1000) Resp:  [9-18] 16 (04/04 1000) BP: (125-154)/(60-91) 152/87 (04/04 1000) SpO2:  [96 %-100 %] 99 % (04/04 1000) Intake/Output      04/03 0701 04/04 0700 04/04 0701 04/05 0700   P.O.     I.V. (mL/kg) 2252.2 (26.3) 1326.6 (15.5)   Total Intake(mL/kg) 2252.2 (26.3) 1326.6 (15.5)   Urine (mL/kg/hr) 3025 (1.5) 0 (0)   Drains 273 12   Stool  0   Total Output 3298 12   Net -1045.8 +1314.6        Urine Occurrence  1 x   Stool Occurrence  2 x    Awake, alert CN grossly intact MAE well, no drift EVD in place  LABS: Lab Results  Component Value Date   CREATININE 0.61 07/06/2020   BUN 8 07/06/2020   NA 132 (L) 07/06/2020   K 3.5 07/06/2020   CL 100 07/06/2020   CO2 23 07/06/2020   Lab Results  Component Value Date   WBC 6.9 06/29/2020   HGB 15.0 06/29/2020   HCT 44.0 06/29/2020   MCV 95.6 06/29/2020   PLT 225 06/29/2020     IMPRESSION: - 50 y.o. female [place] d# 8, initial angio negative. Remains neurologically stable, EVD in place  PLAN: - Will clamp EVD today, monitor neurologic exam - Decrease IVF to 75cc/hr - Repeat confirmatory angiogram today   Consuella Lose, MD [facility]

## 2020-07-06 NOTE — Progress Notes (Signed)
Inpatient Rehab Admissions Coordinator:  Pt is not medically stable for potential CIR admission.  Will continue to follow.   Gayland Curry, [place], [place] Admissions Coordinator [phone]

## 2020-07-06 NOTE — Sedation Documentation (Addendum)
Transported to 4N ICU with SWAT RN [employer]. Bedside handoff given to [facility] RN at bedside. [place] site observed by 2 RNs and care of patient transferred back to primary RN.

## 2020-07-07 ENCOUNTER — Inpatient Hospital Stay (HOSPITAL_COMMUNITY): Payer: No Typology Code available for payment source

## 2020-07-07 LAB — BASIC METABOLIC PANEL
Anion gap: 12 (ref 5–15)
BUN: 6 mg/dL (ref 6–20)
CO2: 21 mmol/L — ABNORMAL LOW (ref 22–32)
Calcium: 8.8 mg/dL — ABNORMAL LOW (ref 8.9–10.3)
Chloride: 94 mmol/L — ABNORMAL LOW (ref 98–111)
Creatinine, Ser: 0.56 mg/dL (ref 0.44–1.00)
GFR, Estimated: >60 mL/min (ref >60)
Glucose, Bld: 161 mg/dL — ABNORMAL HIGH (ref 70–99)
Potassium: 3.4 mmol/L — ABNORMAL LOW (ref 3.5–5.1)
Sodium: 127 mmol/L — ABNORMAL LOW (ref 135–145)

## 2020-07-07 LAB — GLUCOSE, CAPILLARY
Glucose-Capillary: 127 mg/dL — ABNORMAL HIGH (ref 70–99)
Glucose-Capillary: 121 mg/dL — ABNORMAL HIGH (ref 70–99)
Glucose-Capillary: 117 mg/dL — ABNORMAL HIGH (ref 70–99)
Glucose-Capillary: 120 mg/dL — ABNORMAL HIGH (ref 70–99)

## 2020-07-07 NOTE — Progress Notes (Signed)
  NEUROSURGERY PROGRESS NOTE   No issues overnight.  HA waxes/wanes   EXAM:  BP (!) 143/74   Pulse 71   Temp 99.7 F (37.6 C) (Oral)   Resp 13   Ht 5\' 4"  (1.626 m)   Wt 85.7 kg   LMP 12/22/2017   SpO2 100%   BMI 32.43 kg/m   Drowsy, but just given pain meds Easily awakens Speech fluent, appropriate  CN grossly intact  5/5 BUE/BLE  EVD in place  IMPRESSION/PLAN 50 y.o. female angio negative x2 SAH d#9. Neurologically stable with EVD clamped. - CT head today, repeat again tomorrow. If stable, will remove EVD. - CIR candidate. Patient hopefully will be ready for discharge in next 24 hours assuming patient does not start to develop hydro.

## 2020-07-07 NOTE — Progress Notes (Signed)
Physical Therapy Treatment Patient Details Name: Amy Barry MRN: [id number] DOB: February 11, 1971 Today's Date: 07/07/2020    History of Present Illness 50 y.o. F admitted 3/28 with AMS and incomprehensible speech. 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 Significant PMH: HTN, lupus, heart murmur, cervical fusion.    PT Comments    Pt with some regression towards her physical therapy goals; follows 1 step commands only with max, multimodal cueing, repetition and significantly increased time. Requiring increased assist for all mobility; maxA for bed mobility and modA + 2 for basic transfers. Pt spending extended time on [facility] today without success; worked on upper trapezius soft tissue mobilization and stretching while up, but discussed with pt/pt mother about working with nursing to address toileting prior to session in order to continue to progress functional mobility efficiently. Pt verbalized understanding.    Follow Up Recommendations  CIR     Equipment Recommendations  3in1 (PT)    Recommendations for Other Services       Precautions / Restrictions Precautions Precautions: Fall;Other (comment) Precaution Comments: EVD, clamp prior to mobility; SBP < 140 Restrictions Weight Bearing Restrictions: No    Mobility  Bed Mobility Overal bed mobility: Needs Assistance Bed Mobility: Supine to Sit     Supine to sit: Max assist     General bed mobility comments: Pt with no initiation; required assist to bring BLE's off edge of bed and heavy trunk assist to upright as pt resistive    Transfers Overall transfer level: Needs assistance Equipment used: Rolling S[place]to (2 wheeled) Transfers: Sit to/from Stand Sit to Stand: Mod assist;+2 physical assistance         General transfer comment: ModA + 2 for initiation from edge of bed and BSC. Increased time/effort. max  multimodal cues for anterior weight shift as pt was leaning heavily posteriorly  Ambulation/Gait         Gait velocity: decreased       Stairs             Wheelchair Mobility    Modified Rankin (Stroke Patients Only) Modified Rankin (Stroke Patients Only) Pre-Morbid Rankin Score: No symptoms Modified Rankin: Moderately severe disability     Balance Overall balance assessment: Needs assistance Sitting-balance support: Feet supported;Bilateral upper extremity supported Sitting balance-Leahy Scale: Poor Sitting balance - Comments: Posterior support due to pain   Standing balance support: During functional activity;Bilateral upper extremity supported Standing balance-Leahy Scale: Poor Standing balance comment: Reliant on UE support on RW and minA.                            Cognition Arousal/Alertness: Awake/alert Behavior During Therapy: WFL for tasks assessed/performed Overall Cognitive Status: Impaired/Different from baseline Area of Impairment: Problem solving;Safety/judgement;Memory;Following commands;Attention                   Current Attention Level: Sustained Memory: Decreased short-term memory Following Commands: Follows one step commands consistently;Follows one step commands with increased time;Follows multi-step commands inconsistently Safety/Judgement: Decreased awareness of safety   Problem Solving: Slow processing;Requires verbal cues;Difficulty sequencing;Decreased initiation;Requires tactile cues General Comments: Pt requiring significantly increased time for all sequencing/problem solving today and multimodal cues for motor initiation. Also internally distracted by pain.      Exercises Other Exercises Other Exercises: Sitting: soft tissue mobilization to bilateral upper trapezius Other Exercises: Lateral cervical stretch with manual therapy  General Comments        Pertinent Vitals/Pain Pain Assessment: Faces Faces  Pain Scale: Hurts even more Pain Location: Head, neck, buttocks Pain Descriptors / Indicators: Headache;Throbbing;Sharp;Aching Pain Intervention(s): Limited activity within patient's tolerance;Monitored during session    Home Living                      Prior Function            PT Goals (current goals can now be found in the care plan section) Acute Rehab PT Goals Patient Stated Goal: less pain PT Goal Formulation: With patient/family Time For Goal Achievement: 07/14/20 Potential to Achieve Goals: Good Progress towards PT goals: Not progressing toward goals - comment (regression this date)    Frequency    Min 4X/week      PT Plan Current plan remains appropriate    Co-evaluation              AM-PAC PT "6 Clicks" Mobility   Outcome Measure  Help needed turning from your back to your side while in a flat bed without using bedrails?: A Lot Help needed moving from lying on your back to sitting on the side of a flat bed without using bedrails?: Total Help needed moving to and from a bed to a chair (including a wheelchair)?: A Lot Help needed standing up from a chair using your arms (e.g., wheelchair or bedside chair)?: A Lot Help needed to walk in hospital room?: A Little Help needed climbing 3-5 steps with a railing? : Total 6 Click Score: 11    End of Session Equipment Utilized During Treatment: Gait belt Activity Tolerance: Patient tolerated treatment well Patient left: with family/visitor present;in chair;with call bell/phone within reach;with chair alarm set Nurse Communication: Mobility status PT Visit Diagnosis: Pain;Difficulty in walking, not elsewhere classified (R26.2) Pain - part of body:  (head)     Time: 1157-2620 PT Time Calculation (min) (ACUTE ONLY): 38 min  Charges:  $Therapeutic Activity: 38-52 mins                     Wyona Almas, PT, DPT [place] Pager [phone] Office [phone]    Deno Etienne 07/07/2020, 11:06 AM

## 2020-07-07 NOTE — Progress Notes (Signed)
  Speech Language Pathology Treatment: Cognitive-Linquistic  Patient Details Name: Amy Barry MRN: [id number] DOB: 1970-10-29 Today's Date: 07/07/2020 Time: 1216-1232 SLP Time Calculation (min) (ACUTE ONLY): 16 min  Assessment / Plan / Recommendation Clinical Impression  Pt got back in bed 15 min prior to therapist arriving and sleeping although she awakened and responded to therapist keeping eyes closed. Majority of session therapist provided education to mom who this SLP had not met. Reviewed initial assessment, findings and treatment plan and improvements. Her speech was intelligible and no significant dysarthria noted. She was not oriented to month or length of time of hospitalization and demonstrated decreased working memory during session for information provided several minutes earlier. Per MD notes her drain may be removed soon. Next session therapist will attempt to see while pt up in chair and may see following PT/OT session.    HPI HPI: Amy Barry is a 50 y.o. female with GERD, lupus, HTN, heart murmor who presented to the ED with altered mental status and incomprehensible speech. CT which revealed subarachnoid hemorrhage the foramen magnum and prepontine  cisterns with intraventricular reflux into the lateral ventricles      SLP Plan  Continue with current plan of care       Recommendations                   General recommendations: Rehab consult Oral Care Recommendations: Oral care BID Follow up Recommendations: Inpatient Rehab SLP Visit Diagnosis: Cognitive communication deficit (M01.027) Plan: Continue with current plan of care                       Houston Siren 07/07/2020, 1:52 PM  Orbie Pyo Colvin Caroli.Ed Risk analyst [phone] Office [phone]

## 2020-07-08 ENCOUNTER — Inpatient Hospital Stay (HOSPITAL_COMMUNITY): Payer: No Typology Code available for payment source

## 2020-07-08 LAB — GLUCOSE, CAPILLARY
Glucose-Capillary: 119 mg/dL — ABNORMAL HIGH (ref 70–99)
Glucose-Capillary: 99 mg/dL (ref 70–99)
Glucose-Capillary: 108 mg/dL — ABNORMAL HIGH (ref 70–99)
Glucose-Capillary: 118 mg/dL — ABNORMAL HIGH (ref 70–99)

## 2020-07-08 NOTE — Progress Notes (Signed)
  NEUROSURGERY PROGRESS NOTE   Repeat head CT reviewed and compared to prior. No evidence of developing hydrocephalus with EVD clamped x48 hours. Does not appear shunt dependent. EVD removed in clean fashion. 2 staples placed at EVD site.  Patient tolerated well. Ready for discharge to CIR when insurance approval obtained.

## 2020-07-08 NOTE — Progress Notes (Signed)
Physical Therapy Treatment Patient Details Name: Amy Barry MRN: [id number] DOB: 06/25/1970 Today's Date: 07/08/2020    History of Present Illness 50 y.o. F admitted 3/28 with AMS and incomprehensible speech. 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 EVd removed 4/6. Significant PMH: HTN, lupus, heart murmur, cervical fusion.    PT Comments    Pt with increased participation in therapy session today; still requiring increased assist for bed mobility, but ambulating x 60 feet with a Eichholz and min assist (+2 for chair follow). Pt demonstrates consistent left drift, bumping into obstacles despite cues. She continues with very slowed processing and is not oriented to year (stating it was 2024 repetitively). Recommend CIR for cognitive remediation, strengthening, balance and gait training.     Follow Up Recommendations  CIR     Equipment Recommendations  3in1 (PT)    Recommendations for Other Services       Precautions / Restrictions Precautions Precautions: Fall;Other (comment) Precaution Comments: SBP < 140 Restrictions Weight Bearing Restrictions: No    Mobility  Bed Mobility Overal bed mobility: Needs Assistance Bed Mobility: Supine to Sit     Supine to sit: Max assist     General bed mobility comments: Decreased initiation; assist for BLE's and trunk off edge of bed. Pt resistive    Transfers Overall transfer level: Needs assistance Equipment used: Rolling Jacquez (2 wheeled) Transfers: Sit to/from Stand Sit to Stand: Min assist;Mod assist         General transfer comment: When pt initiating, requiring minA to rise to stand from edge of bed with increased time to rise. On subsequent stand from recliner, pt requiring modA and use of bed pad to scoot hips forward to edge of bed as she could not sequence to do so  Ambulation/Gait Ambulation/Gait  assistance: +2 safety/equipment;Min assist Gait Distance (Feet): 60 Feet Assistive device: Rolling Alaimo (2 wheeled) Gait Pattern/deviations: Step-through pattern;Decreased stride length;Drifts right/left Gait velocity: decreased   General Gait Details: Pt with consistent left drift despite cues, minA for balance and [place] negotiation, close chair follow utilized. Requiring one seated rest break due to back spasm   Stairs             Wheelchair Mobility    Modified Rankin (Stroke Patients Only) Modified Rankin (Stroke Patients Only) Pre-Morbid Rankin Score: No symptoms Modified Rankin: Moderately severe disability     Balance Overall balance assessment: Needs assistance Sitting-balance support: Feet supported;Bilateral upper extremity supported Sitting balance-Leahy Scale: Poor Sitting balance - Comments: Posterior support due to pain   Standing balance support: During functional activity;Bilateral upper extremity supported Standing balance-Leahy Scale: Poor Standing balance comment: Reliant on UE support on RW and minA.                            Cognition Arousal/Alertness: Awake/alert Behavior During Therapy: WFL for tasks assessed/performed Overall Cognitive Status: Impaired/Different from baseline Area of Impairment: Problem solving;Safety/judgement;Memory;Following commands;Attention                   Current Attention Level: Sustained Memory: Decreased short-term memory Following Commands: Follows one step commands consistently;Follows one step commands with increased time;Follows multi-step commands inconsistently Safety/Judgement: Decreased awareness of safety   Problem Solving: Slow processing;Requires verbal cues;Difficulty sequencing;Decreased initiation;Requires tactile cues General Comments: Significantly slowed processing, often requires multimodal cues and repetition to perform task.  Exercises      General Comments         Pertinent Vitals/Pain Pain Assessment: Faces Faces Pain Scale: Hurts even more Pain Location: Head, back Pain Descriptors / Indicators: Headache;Throbbing;Sharp;Aching;Spasm Pain Intervention(s): Limited activity within patient's tolerance;Monitored during session    Home Living                      Prior Function            PT Goals (current goals can now be found in the care plan section) Acute Rehab PT Goals Patient Stated Goal: less pain PT Goal Formulation: With patient/family Time For Goal Achievement: 07/14/20 Potential to Achieve Goals: Good Progress towards PT goals: Progressing toward goals    Frequency    Min 4X/week      PT Plan Current plan remains appropriate    Co-evaluation              AM-PAC PT "6 Clicks" Mobility   Outcome Measure  Help needed turning from your back to your side while in a flat bed without using bedrails?: A Lot Help needed moving from lying on your back to sitting on the side of a flat bed without using bedrails?: Total Help needed moving to and from a bed to a chair (including a wheelchair)?: A Lot Help needed standing up from a chair using your arms (e.g., wheelchair or bedside chair)?: A Lot Help needed to walk in hospital room?: A Little Help needed climbing 3-5 steps with a railing? : Total 6 Click Score: 11    End of Session Equipment Utilized During Treatment: Gait belt Activity Tolerance: Patient tolerated treatment well Patient left: with family/visitor present;in chair;with call bell/phone within reach;with chair alarm set Nurse Communication: Mobility status PT Visit Diagnosis: Pain;Difficulty in walking, not elsewhere classified (R26.2) Pain - part of body:  (head)     Time: 5449-2010 PT Time Calculation (min) (ACUTE ONLY): 37 min  Charges:  $Gait Training: 8-22 mins $Therapeutic Activity: 8-22 mins                     Wyona Almas, PT, DPT Acute Rehabilitation Services Pager  [phone] Office [phone]    Deno Etienne 07/08/2020, 5:03 PM

## 2020-07-08 NOTE — Progress Notes (Signed)
  NEUROSURGERY PROGRESS NOTE   No issues overnight.  HA this am. No new N/T/W  EXAM:  BP (!) 153/74   Pulse 92   Temp 98.9 F (37.2 C) (Oral)   Resp 16   Ht 5\' 4"  (1.626 m)   Wt 85.7 kg   LMP 12/22/2017   SpO2 100%   BMI 32.43 kg/m   Awake, alert, oriented  Speech fluent, appropriate  CN grossly intact  MAEW, non focal EVD in place. Clamped  IMPRESSION/PLAN 50 y.o. 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 Neurologically stable with EVD clamped. CT yesterday did not show evidence of developing hydrocephalus. Will repeat CT today. If stable, will d/c EVD. Will be ready for rehab pending removal.

## 2020-07-08 NOTE — Progress Notes (Signed)
0900: Neurosurgery notified CT head completed.

## 2020-07-09 ENCOUNTER — Encounter (HOSPITAL_COMMUNITY): Payer: Self-pay | Admitting: Physical Medicine & Rehabilitation

## 2020-07-09 ENCOUNTER — Inpatient Hospital Stay (HOSPITAL_COMMUNITY)
Admission: RE | Admit: 2020-07-09 | Discharge: 2020-07-24 | DRG: 057 | Disposition: A | Discharge status: Home Health | Payer: No Typology Code available for payment source | Source: Intra-hospital | Attending: Physical Medicine & Rehabilitation | Admitting: Physical Medicine & Rehabilitation

## 2020-07-09 ENCOUNTER — Other Ambulatory Visit: Payer: Self-pay

## 2020-07-09 DIAGNOSIS — K219 Gastro-esophageal reflux disease without esophagitis: Secondary | ICD-10-CM | POA: Diagnosis present

## 2020-07-09 DIAGNOSIS — M21371 Foot drop, right foot: Secondary | ICD-10-CM | POA: Diagnosis present

## 2020-07-09 DIAGNOSIS — R739 Hyperglycemia, unspecified: Secondary | ICD-10-CM | POA: Diagnosis present

## 2020-07-09 DIAGNOSIS — G441 Vascular headache, not elsewhere classified: Secondary | ICD-10-CM | POA: Diagnosis not present

## 2020-07-09 DIAGNOSIS — E559 Vitamin D deficiency, unspecified: Secondary | ICD-10-CM | POA: Diagnosis present

## 2020-07-09 DIAGNOSIS — I69054 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: Secondary | ICD-10-CM | POA: Diagnosis present

## 2020-07-09 DIAGNOSIS — G919 Hydrocephalus, unspecified: Secondary | ICD-10-CM | POA: Diagnosis present

## 2020-07-09 DIAGNOSIS — R159 Full incontinence of feces: Secondary | ICD-10-CM | POA: Diagnosis present

## 2020-07-09 DIAGNOSIS — K580 Irritable bowel syndrome with diarrhea: Secondary | ICD-10-CM | POA: Diagnosis present

## 2020-07-09 DIAGNOSIS — I609 Nontraumatic subarachnoid hemorrhage, unspecified: Secondary | ICD-10-CM | POA: Diagnosis not present

## 2020-07-09 DIAGNOSIS — R269 Unspecified abnormalities of gait and mobility: Secondary | ICD-10-CM

## 2020-07-09 DIAGNOSIS — D509 Iron deficiency anemia, unspecified: Secondary | ICD-10-CM | POA: Diagnosis present

## 2020-07-09 DIAGNOSIS — R41844 Frontal lobe and executive function deficit: Secondary | ICD-10-CM

## 2020-07-09 DIAGNOSIS — Z90710 Acquired absence of both cervix and uterus: Secondary | ICD-10-CM

## 2020-07-09 DIAGNOSIS — R4189 Other symptoms and signs involving cognitive functions and awareness: Secondary | ICD-10-CM | POA: Diagnosis not present

## 2020-07-09 DIAGNOSIS — E876 Hypokalemia: Secondary | ICD-10-CM | POA: Diagnosis present

## 2020-07-09 DIAGNOSIS — E871 Hypo-osmolality and hyponatremia: Secondary | ICD-10-CM

## 2020-07-09 DIAGNOSIS — Z981 Arthrodesis status: Secondary | ICD-10-CM

## 2020-07-09 DIAGNOSIS — R509 Fever, unspecified: Secondary | ICD-10-CM | POA: Diagnosis not present

## 2020-07-09 DIAGNOSIS — Z823 Family history of stroke: Secondary | ICD-10-CM | POA: Diagnosis not present

## 2020-07-09 DIAGNOSIS — I10 Essential (primary) hypertension: Secondary | ICD-10-CM | POA: Diagnosis present

## 2020-07-09 DIAGNOSIS — M542 Cervicalgia: Secondary | ICD-10-CM

## 2020-07-09 DIAGNOSIS — M329 Systemic lupus erythematosus, unspecified: Secondary | ICD-10-CM | POA: Diagnosis present

## 2020-07-09 DIAGNOSIS — R519 Headache, unspecified: Secondary | ICD-10-CM

## 2020-07-09 LAB — CBC WITH DIFFERENTIAL/PLATELET
Abs Immature Granulocytes: 0.05 10*3/uL (ref 0.00–0.07)
Basophils Absolute: 0 10*3/uL (ref 0.0–0.1)
Basophils Relative: 0 %
Eosinophils Absolute: 0 10*3/uL (ref 0.0–0.5)
Eosinophils Relative: 0 %
HCT: 34.2 % — ABNORMAL LOW (ref 36.0–46.0)
Hemoglobin: 11.7 g/dL — ABNORMAL LOW (ref 12.0–15.0)
Immature Granulocytes: 0 %
Lymphocytes Relative: 13 %
Lymphs Abs: 1.4 10*3/uL (ref 0.7–4.0)
MCH: 31.9 pg (ref 26.0–34.0)
MCHC: 34.2 g/dL (ref 30.0–36.0)
MCV: 93.2 fL (ref 80.0–100.0)
Monocytes Absolute: 1.4 10*3/uL — ABNORMAL HIGH (ref 0.1–1.0)
Monocytes Relative: 13 %
Neutro Abs: 8.3 10*3/uL — ABNORMAL HIGH (ref 1.7–7.7)
Neutrophils Relative %: 74 %
Platelets: 317 10*3/uL (ref 150–400)
RBC: 3.67 MIL/uL — ABNORMAL LOW (ref 3.87–5.11)
RDW: 11.9 % (ref 11.5–15.5)
WBC: 11.3 10*3/uL — ABNORMAL HIGH (ref 4.0–10.5)
nRBC: 0 % (ref 0.0–0.2)

## 2020-07-09 LAB — GLUCOSE, CAPILLARY
Glucose-Capillary: 101 mg/dL — ABNORMAL HIGH (ref 70–99)
Glucose-Capillary: 117 mg/dL — ABNORMAL HIGH (ref 70–99)
Glucose-Capillary: 106 mg/dL — ABNORMAL HIGH (ref 70–99)

## 2020-07-09 MED ORDER — HYDROXYCHLOROQUINE SULFATE 200 MG PO TABS
200.0000 mg | ORAL_TABLET | Freq: Every day | ORAL | Status: AC
Start: 2020-07-10 — End: 2020-07-17
  Administered 2020-07-11 – 2020-07-16 (×4): 200 mg via ORAL
  Filled 2020-07-09 (×7): qty 1

## 2020-07-09 MED ORDER — CYCLOBENZAPRINE HCL 5 MG PO TABS
5.0000 mg | ORAL_TABLET | Freq: Three times a day (TID) | ORAL | Status: DC | PRN
  Administered 2020-07-09 – 2020-07-19 (×3): 10 mg via ORAL
  Filled 2020-07-09 (×5): qty 2

## 2020-07-09 MED ORDER — TRAZODONE HCL 50 MG PO TABS
25.0000 mg | ORAL_TABLET | Freq: Every evening | ORAL | Status: DC | PRN
  Administered 2020-07-12: 50 mg via ORAL
  Filled 2020-07-09: qty 1

## 2020-07-09 MED ORDER — PROCHLORPERAZINE 25 MG RE SUPP
12.5000 mg | Freq: Four times a day (QID) | RECTAL | Status: DC | PRN
Start: 2020-07-09 — End: 2020-07-24

## 2020-07-09 MED ORDER — GUAIFENESIN-DM 100-10 MG/5ML PO SYRP: 5.0000 mL | ORAL_SOLUTION | Freq: Four times a day (QID) | ORAL | Status: DC | PRN

## 2020-07-09 MED ORDER — OXYCODONE HCL 5 MG PO TABS
5.0000 mg | ORAL_TABLET | Freq: Four times a day (QID) | ORAL | Status: DC | PRN
  Administered 2020-07-09 – 2020-07-21 (×22): 10 mg via ORAL
  Filled 2020-07-09 (×22): qty 2

## 2020-07-09 MED ORDER — ALUM & MAG HYDROXIDE-SIMETH 200-200-20 MG/5ML PO SUSP: 30.0000 mL | ORAL | Status: DC | PRN

## 2020-07-09 MED ORDER — DIPHENHYDRAMINE HCL 12.5 MG/5ML PO ELIX: 12.5000 mg | ORAL_SOLUTION | Freq: Four times a day (QID) | ORAL | Status: DC | PRN

## 2020-07-09 MED ORDER — BISACODYL 10 MG RE SUPP: 10.0000 mg | Freq: Every day | RECTAL | Status: DC | PRN

## 2020-07-09 MED ORDER — ONDANSETRON HCL 4 MG/2ML IJ SOLN
4.0000 mg | Freq: Four times a day (QID) | INTRAMUSCULAR | Status: DC | PRN
  Filled 2020-07-09: qty 2

## 2020-07-09 MED ORDER — PANTOPRAZOLE SODIUM 40 MG PO TBEC
40.0000 mg | DELAYED_RELEASE_TABLET | Freq: Every day | ORAL | Status: DC
  Administered 2020-07-10 – 2020-07-24 (×15): 40 mg via ORAL
  Filled 2020-07-09 (×15): qty 1

## 2020-07-09 MED ORDER — PROCHLORPERAZINE EDISYLATE 10 MG/2ML IJ SOLN: 5.0000 mg | Freq: Four times a day (QID) | INTRAMUSCULAR | Status: DC | PRN

## 2020-07-09 MED ORDER — PROCHLORPERAZINE MALEATE 5 MG PO TABS
5.0000 mg | ORAL_TABLET | Freq: Four times a day (QID) | ORAL | Status: DC | PRN
  Administered 2020-07-20: 5 mg via ORAL
  Filled 2020-07-09: qty 1

## 2020-07-09 MED ORDER — INSULIN ASPART 100 UNIT/ML ~~LOC~~ SOLN: 0.0000 [IU] | Freq: Every day | SUBCUTANEOUS | Status: DC

## 2020-07-09 MED ORDER — LEVETIRACETAM 100 MG/ML PO SOLN
500.0000 mg | Freq: Two times a day (BID) | ORAL | Status: DC
  Administered 2020-07-09 – 2020-07-24 (×30): 500 mg via ORAL
  Filled 2020-07-09 (×30): qty 5

## 2020-07-09 MED ORDER — INSULIN ASPART 100 UNIT/ML ~~LOC~~ SOLN
0.0000 [IU] | Freq: Three times a day (TID) | SUBCUTANEOUS | Status: DC
  Administered 2020-07-18: 2 [IU] via SUBCUTANEOUS

## 2020-07-09 MED ORDER — LIDOCAINE 5 % EX PTCH
2.0000 | MEDICATED_PATCH | CUTANEOUS | Status: DC
  Administered 2020-07-09 – 2020-07-23 (×15): 2 via TRANSDERMAL
  Filled 2020-07-09 (×16): qty 2

## 2020-07-09 MED ORDER — ACETAMINOPHEN-CODEINE #3 300-30 MG PO TABS
1.0000 | ORAL_TABLET | ORAL | Status: DC | PRN
Start: 2020-07-09 — End: 2020-07-24
  Administered 2020-07-10 – 2020-07-11 (×3): 2 via ORAL
  Administered 2020-07-12 (×2): 1 via ORAL
  Administered 2020-07-13 – 2020-07-16 (×2): 2 via ORAL
  Administered 2020-07-16 – 2020-07-17 (×3): 1 via ORAL
  Administered 2020-07-19: 2 via ORAL
  Administered 2020-07-19: 1 via ORAL
  Administered 2020-07-21: 2 via ORAL
  Administered 2020-07-22: 1 via ORAL
  Administered 2020-07-23 – 2020-07-24 (×2): 2 via ORAL
  Filled 2020-07-09 (×3): qty 2
  Filled 2020-07-09: qty 1
  Filled 2020-07-09 (×2): qty 2
  Filled 2020-07-09 (×2): qty 1
  Filled 2020-07-09: qty 2
  Filled 2020-07-09: qty 1
  Filled 2020-07-09 (×2): qty 2
  Filled 2020-07-09 (×3): qty 1
  Filled 2020-07-09 (×2): qty 2

## 2020-07-09 MED ORDER — SODIUM CHLORIDE 1 G PO TABS
1.0000 g | ORAL_TABLET | Freq: Three times a day (TID) | ORAL | Status: DC
  Administered 2020-07-09 – 2020-07-22 (×36): 1 g via ORAL
  Filled 2020-07-09 (×40): qty 1

## 2020-07-09 MED ORDER — LEVETIRACETAM 100 MG/ML PO SOLN
500.0000 mg | Freq: Two times a day (BID) | ORAL | Status: DC
  Administered 2020-07-09: 500 mg via ORAL
  Filled 2020-07-09: qty 5

## 2020-07-09 MED ORDER — ONDANSETRON 4 MG PO TBDP
4.0000 mg | ORAL_TABLET | Freq: Four times a day (QID) | ORAL | Status: DC | PRN
  Administered 2020-07-10 – 2020-07-22 (×4): 4 mg via ORAL
  Filled 2020-07-09 (×4): qty 1

## 2020-07-09 MED ORDER — FLEET ENEMA 7-19 GM/118ML RE ENEM: 1.0000 | ENEMA | Freq: Once | RECTAL | Status: DC | PRN

## 2020-07-09 MED ORDER — FOLIC ACID 1 MG PO TABS
1.0000 mg | ORAL_TABLET | Freq: Every day | ORAL | Status: DC
  Administered 2020-07-10 – 2020-07-24 (×15): 1 mg via ORAL
  Filled 2020-07-09 (×15): qty 1

## 2020-07-09 MED ORDER — GABAPENTIN 300 MG PO CAPS
300.0000 mg | ORAL_CAPSULE | Freq: Every day | ORAL | Status: DC
  Administered 2020-07-09 – 2020-07-23 (×15): 300 mg via ORAL
  Filled 2020-07-09 (×15): qty 1

## 2020-07-09 MED ORDER — NIMODIPINE 6 MG/ML PO SOLN
60.0000 mg | ORAL | Status: AC
  Administered 2020-07-09 – 2020-07-19 (×61): 60 mg via ORAL
  Filled 2020-07-09 (×63): qty 10

## 2020-07-09 MED ORDER — CALCIUM CITRATE 950 (200 CA) MG PO TABS
200.0000 mg | ORAL_TABLET | Freq: Two times a day (BID) | ORAL | Status: DC
  Administered 2020-07-09 – 2020-07-24 (×30): 200 mg via ORAL
  Filled 2020-07-09 (×30): qty 1

## 2020-07-09 MED ORDER — AMLODIPINE BESYLATE 5 MG PO TABS
5.0000 mg | ORAL_TABLET | Freq: Every day | ORAL | Status: DC
  Administered 2020-07-09 – 2020-07-24 (×15): 5 mg via ORAL
  Filled 2020-07-09 (×15): qty 1

## 2020-07-09 MED ORDER — ACETAMINOPHEN 325 MG PO TABS
325.0000 mg | ORAL_TABLET | ORAL | Status: DC | PRN
  Administered 2020-07-10 – 2020-07-23 (×13): 650 mg via ORAL
  Filled 2020-07-09 (×14): qty 2

## 2020-07-09 MED ORDER — POTASSIUM CHLORIDE 20 MEQ/15ML (10%) PO SOLN
10.0000 meq | Freq: Every day | ORAL | Status: DC
  Administered 2020-07-09: 10 meq via ORAL
  Filled 2020-07-09: qty 15

## 2020-07-09 MED ORDER — METOPROLOL TARTRATE 50 MG PO TABS
100.0000 mg | ORAL_TABLET | Freq: Two times a day (BID) | ORAL | Status: DC
  Administered 2020-07-09 – 2020-07-24 (×30): 100 mg via ORAL
  Filled 2020-07-09 (×30): qty 2

## 2020-07-09 MED ORDER — POTASSIUM CHLORIDE 20 MEQ PO PACK
20.0000 meq | PACK | Freq: Every day | ORAL | Status: DC
  Administered 2020-07-10: 20 meq via ORAL
  Filled 2020-07-09: qty 1

## 2020-07-09 MED ORDER — NIMODIPINE 6 MG/ML PO SOLN: 60.0000 mg | ORAL | 0 refills | Status: DC

## 2020-07-09 MED ORDER — METHOCARBAMOL 750 MG PO TABS
750.0000 mg | ORAL_TABLET | Freq: Four times a day (QID) | ORAL | Status: DC
  Administered 2020-07-09 – 2020-07-21 (×47): 750 mg via ORAL
  Filled 2020-07-09 (×47): qty 1

## 2020-07-09 MED ORDER — POLYETHYLENE GLYCOL 3350 17 G PO PACK: 17.0000 g | PACK | Freq: Every day | ORAL | Status: DC | PRN

## 2020-07-09 NOTE — Progress Notes (Signed)
Courtney Heys, MD  Physician  Physical Medicine and Rehabilitation  Consult Note    Signed  Date of Service:  06/30/2020 11:59 AM      Related encounter: ED to Hosp-Admission (Discharged) from 06/29/2020 in [facility]       Signed      Expand All Collapse All     Show:Clear all [x] Manual[x] Template[] Copied  Added by: [x] Love, Ivan Anchors, PA-C[x] Courtney Heys, MD   [] Hover for details       Physical Medicine and Rehabilitation Consult   Reason for Consult:Functional deficits.  Referring Physician: Dr. Kathyrn Sheriff.    HPI: Amy Barry is a 50 y.o. female with history of HTN, IBS, anemia, connective tissue disorder---on MTX and plaquenil, family history of cerebral aneurysms who was admitted on 06/29/20 after found by daughter with slurred speech and confusion. She was found to have [place] in posterior fossa felt to be aneurysmal and . CTA head neck did not show definate source of bleed but had poor visualization of proximal L-ICA with tiny bulbous area question ruptured dissection. She was evaluated by NS and underwent cerebral angio with placement of right frontal ventric drain by Dr. Kathyrn Sheriff. This was negative for AVN, aneurysm, fistula or dissection. She continues on nimotop with TCD to monitor for vasospasms and SBP goal<140. Therapy evaluations completed today revealing limitations due to pain, weakness and balance deficits. CIR recommended due to functional decline.    Pt c/o severe neck pain since came into hospital.  Also HA's B/L frontal HA's- behind eyes- horrific- meds not helping much.  LBM 1 week ago- usually goes Monday to Wednesday due to plaquenil No BM since admission L hip pain- L groin- in crease- doesn't know why. New!  Review of Systems  Constitutional: Negative for fever.  HENT: Negative for hearing loss and tinnitus.   Eyes: Positive for blurred vision (but does not have her glasses). Negative for double vision.   Respiratory: Positive for shortness of breath.   Cardiovascular: Negative for leg swelling.  Gastrointestinal: Positive for heartburn. Negative for constipation.  Genitourinary: Negative for dysuria and frequency.  Musculoskeletal: Positive for joint pain (left hip for past few weeks) and myalgias.  Skin: Negative for rash.  Neurological: Positive for weakness and headaches.  Psychiatric/Behavioral: Negative for memory loss.  All other systems reviewed and are negative.         Past Medical History:  Diagnosis Date  . Arthralgia   . Generalized weakness    bilateral upper and lower extremities,  walks with cane  . GERD (gastroesophageal reflux disease)   . Headache(784.0)   . Heart murmur    per pt "since childhood, no problems"  . History of kidney stones   . Hypertension    followed pcp  . IBS (irritable bowel syndrome)   . Iron deficiency anemia    12-06-2017 per pt recently had IV iron infusion July 2019  . Low vitamin D level   . Osteoarthritis   . PVC (premature ventricular contraction)    per pt had holter monitor  . SLE (systemic lupus erythematosus) ([place])    rheumotologist-- dr syed  . Wears glasses          Past Surgical History:  Procedure Laterality Date  . CERVICAL DISC ARTHROPLASTY N/A 10/23/2015   Procedure: Cervical Disc Arthroplasty Cervical six-seven;  Surgeon: Eustace Moore, MD;  Location: [facility];  Service: Neurosurgery;  Laterality: N/A;  . D & C HYSTEROSCOPY W/ RESECTION ENDOMETRIAL POLYP  10-13-2003    dr rivard @[facility]   . ESOPHAGOGASTRODUODENOSCOPY     for GERD  . IR ANGIO INTRA EXTRACRAN SEL INTERNAL CAROTID BILAT MOD SED  06/29/2020  . IR ANGIO VERTEBRAL SEL VERTEBRAL BILAT MOD SED  06/29/2020  . LAPAROSCOPIC ABDOMINAL EXPLORATION  1996   dx ibs  . OVARIAN CYST SURGERY  age 44   x2 cyst  . RADIOLOGY WITH ANESTHESIA N/A 06/29/2020   Procedure: IR WITH ANESTHESIA;  Surgeon: Radiologist, Medication, MD;   Location: [facility];  Service: Radiology;  Laterality: N/A;  . ROBOTIC ASSISTED TOTAL HYSTERECTOMY N/A 12/13/2017   Procedure: XI ROBOTIC ASSISTED TOTAL HYSTERECTOMYWITH BILATERAL SALPINGECTOMY;  Surgeon: Christophe Louis, MD;  Location: [facility];  Service: Gynecology;  Laterality: N/A;  . TUBAL LIGATION Bilateral 06-01-2002   dr Mancel Bale @[facility]    PPTL         Family History  Problem Relation Age of Onset  . Arthritis Other   . Hyperlipidemia Other   . Hypertension Daughter   . Colon polyps Daughter 9       hermatoma oversized polyps-benign  . Stroke Other   . Leukemia Maternal Grandmother   . Heart disease Maternal Grandmother   . Asthma Sister   . Hypertension Mother        hx aneursym and surgery  . Atrial fibrillation Mother   . Sarcoidosis Mother   . Healthy Father   . Colon cancer Neg Hx     Social History: Married. Works as a Marine scientist for [employer]. She was independent without AD for the past year (has cane, [place], etc) she   reports that she has never smoked. She has never used smokeless tobacco. She reports that she does not drink alcohol and does not use drugs.    Allergies  Allergen Reactions  . Imitrex [Sumatriptan] Other (See Comments)    High blood pressure  . Demerol [Meperidine] Nausea And Vomiting  . Oxycodone Other (See Comments)    DROWSY  . Reglan [Metoclopramide] Other (See Comments)    "shakiness"  . Tramadol Nausea Only  . Codeine Palpitations  . Propoxyphene N-Acetaminophen Nausea And Vomiting          Medications Prior to Admission  Medication Sig Dispense Refill  . amLODipine (NORVASC) 5 MG tablet Take 5 mg by mouth daily.    Marland Kitchen docusate sodium (COLACE) 50 MG capsule Take 50 mg by mouth daily as needed (for constipation.).    Marland Kitchen folic acid (FOLVITE) 1 MG tablet Take 1 mg by mouth daily.    . furosemide (LASIX) 40 MG tablet Take 1 tablet (40 mg total) by mouth daily. 90 tablet 3  . gabapentin (NEURONTIN) 300 MG capsule Take  300 mg by mouth 3 (three) times daily as needed (pain).    . hydroxychloroquine (PLAQUENIL) 200 MG tablet Take 200 mg by mouth 2 (two) times daily.   1  . ibuprofen (ADVIL,MOTRIN) 200 MG tablet Take 600-800 mg by mouth every 6 (six) hours as needed (for pain/fever).    . methotrexate 2.5 MG tablet Take 15 mg by mouth once a week. Friday    . metoprolol (LOPRESSOR) 100 MG tablet TAKE ONE TABLET BY MOUTH TWICE DAILY (Patient taking differently: Take 100 mg by mouth 2 (two) times daily.) 180 tablet 3  . Multiple Vitamin (MULTIVITAMIN WITH MINERALS) TABS Take 1 tablet by mouth daily.     Marland Kitchen omeprazole (PRILOSEC) 40 MG capsule Take 40 mg by mouth daily as needed (acid reflux).    Marland Kitchen  ondansetron (ZOFRAN) 4 MG tablet Take 1 tablet (4 mg total) by mouth every 6 (six) hours as needed for nausea. 20 tablet 0  . pantoprazole (PROTONIX) 40 MG tablet Take 40 mg by mouth daily as needed (for acid reflux/indigestion).     . potassium chloride (K-DUR) 10 MEQ tablet TAKE ONE TABLET BY MOUTH ONCE DAILY (Patient taking differently: Take 10 mEq by mouth daily.) 90 tablet 3  . TURMERIC PO Take 1 capsule by mouth daily.      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 Had someone put on socks over weekend and will ask for  help from family if having a "bad day" Functional Status:  Mobility: Bed Mobility Overal bed mobility: Needs Assistance Bed Mobility: Supine to Sit Supine to sit: Mod assist,+2 for physical assistance General bed mobility comments: Pt bringing BLE's off edge of bed with cues, assist for trunk to upright with head support Transfers Overall transfer level: Needs assistance Equipment used: None Transfers: Sit to/from [employer] Sit to Stand: Min assist,+2 physical assistance Stand pivot transfers: Min assist,+2 physical assistance General transfer comment: MinA + 2 for stand pivot transfer towards right, guidance for hips over to chair, head support provided. No knee buckle noted  ADL: ADL Overall ADL's : Needs assistance/impaired Eating/Feeding: Minimal assistance,Sitting Grooming: Wash/dry face,Minimal assistance,Sitting Upper Body Bathing: Moderate assistance Lower Body Bathing: Maximal assistance Lower Body Dressing: Maximal assistance Lower Body Dressing Details (indicate cue type and reason): can figure 4 cross bil LE but limited by pain in head at this time. Therapist total (A) Toilet Transfer: +2 for physical assistance,Minimal assistance Toilet Transfer Details (indicate cue type and reason): simulated EOB to chair. Pt is only 5'1" so bed is elevated for her to basic transfer to chair General ADL Comments: pt HA and reports pain at neck. pt progressed to sitting with support of trunk and neck. pt states "my head feels heavy"  Cognition: Cognition Overall Cognitive Status: Impaired/Different from baseline Arousal/Alertness: Lethargic Orientation Level: Oriented to person,Oriented to place,Oriented to situation Attention: Sustained Sustained Attention: Impaired Sustained Attention Impairment: Verbal basic Memory:  (will assess further) Awareness: Impaired Awareness Impairment: Anticipatory impairment,Emergent impairment,Intellectual impairment Problem  Solving:  (will assess when alertness improves) Safety/Judgment: Impaired Cognition Arousal/Alertness: Awake/alert Behavior During Therapy: WFL for tasks assessed/performed Overall Cognitive Status: Impaired/Different from baseline Area of Impairment: Orientation Orientation Level: Disoriented to,Time General Comments: reports april 2022 and when cued incorrect states "march" pt aware of location and reason for admission.   Blood pressure (!) 141/73, pulse 86, temperature 98.5 F (36.9 C), temperature source Oral, resp. rate 14, height 5\' 4"  (1.626 m), weight 85.7 kg, last menstrual period 12/22/2017, SpO2 100 %. Physical Exam Vitals and nursing note reviewed.  Constitutional:      Comments: [place] in nare. Right ventriculostomy drain right scalp.   Pt awake, but sleepy- sitting up somewhat in bed c/o neck pain; nurse in and out, EVD in R frontal area, NAD  Eating chicken noodle soup  HENT:     Head:     Comments: EVD in R frontal scalp as above- smile appears equal at rest    Right Ear: External ear normal.     Left Ear: External ear normal.     Nose: Nose normal. No congestion.     Mouth/Throat:     Mouth: Mucous membranes are dry.     Pharynx: Oropharynx is clear. No oropharyngeal exudate.  Eyes:     General:        Right eye: No discharge.        Left eye: No discharge.     Conjunctiva/sclera: Conjunctivae normal.  Neck:     Comments: Tight in scalenes, levators, and upper traps and splenius capitus B/L- very tight Cardiovascular:     Rate and Rhythm: Normal rate and regular rhythm.     Heart sounds: Normal heart sounds. No murmur heard. No gallop.   Pulmonary:     Comments: CTA B/L- no W/R/R- good air movement Abdominal:     Comments: Soft, NT, ND, (+)BS - non distended, however hypoactive- no tinkling  Musculoskeletal:     Comments: Able to have at least 4/5 grip B/L as well as wiggling fingers and toes- pretty equally- DF/PF at least 4/5 B/L  TTP over L inner  groin/crease of hip- appears more pelvic pain than L hip pain  Skin:    General: Skin is warm and dry.  Neurological:     Mental Status: She is oriented to person, place, and time and easily aroused. She is lethargic.     Comments: Delayed processing but internally distracted by pain and lethargic. She was able to answer basic orentation questions. She needed cues to stay awake. She was able to move all four.   Slowed processing and delayed- but did answer questions appropriately.  Decreased to light touch in RLE- said is chronic Intact in UEs and LLE   Psychiatric:     Comments: Slightly flat- able to answer questions about family/job, etc     Lab Results Last 24 Hours       Results for orders placed or performed during the hospital encounter of 06/29/20 (from the past 24 hour(s))  Triglycerides     Status: None   Collection Time: 06/30/20  4:34 AM  Result Value Ref Range   Triglycerides 66 <150 mg/dL      Imaging Results (Last 48 hours)  CT ANGIO HEAD W OR WO CONTRAST  Result Date: 06/29/2020 CLINICAL DATA:  Subarachnoid hemorrhage. EXAM: CT ANGIOGRAPHY HEAD AND NECK TECHNIQUE: Multidetector CT imaging of the head and neck was performed using the standard protocol during bolus administration of intravenous contrast. Multiplanar CT image reconstructions and MIPs were obtained to evaluate the vascular anatomy. Carotid stenosis measurements (when applicable) are obtained utilizing NASCET criteria, using the distal internal carotid diameter as the denominator. CONTRAST:  2mL OMNIPAQUE IOHEXOL 350 MG/ML SOLN COMPARISON:  CTA of the head 06/19/2012 FINDINGS: CTA NECK FINDINGS Aortic arch: Normal.  Three vessel branching. Right carotid system: Vessels are smooth and widely patent. Left carotid system: Vessels are smooth and widely patent. Vertebral arteries: Vessels are smooth and widely patent. Skeleton: Disc arthroplasty at C6-7. Other neck: No evidence of inflammation or mass.  Upper chest: Negative Review of the MIP images confirms the above findings CTA HEAD FINDINGS Anterior circulation: Mild calcification at the anterior genu of the right cavernous ICA. No branch occlusion, beading, or aneurysm. Some arteries are partially  obscured by venous contamination. Posterior circulation: The vertebral and basilar arteries are smoothly contoured and widely patent. The left PICA origin is not well demonstrated and there is a subsequent tiny bulbous appearance, see 8:151. No visible vascular malformation or venous shunting. Venous sinuses: Diffusely patent Anatomic variants: None significant Review of the MIP images confirms the above findings IMPRESSION: No definite source of subarachnoid hemorrhage but there poor visualization of the proximal left PICA with a tiny bulbous area, question ruptured dissection. Electronically Signed   By: Monte Fantasia M.D.   On: 06/29/2020 07:08   CT HEAD WO CONTRAST  Result Date: 06/29/2020 CLINICAL DATA:  Intracranial hemorrhage, follow-up EXAM: CT HEAD WITHOUT CONTRAST TECHNIQUE: Contiguous axial images were obtained from the base of the skull through the vertex without intravenous contrast. COMPARISON:  Earlier same day FINDINGS: Brain: Subarachnoid hemorrhage is present within the basal cisterns and within the ventricular system. Apparent increased hyperdensity within basal cisterns and sulci probably related to interval angiogram. There is increased hydrocephalus.  Increased sulcal effacement. Gray-white differentiation is preserved. Vascular: Residual contrast from angiogram. Skull: Calvarium is unremarkable. Sinuses/Orbits: No acute finding. Other: None. IMPRESSION: Subarachnoid hemorrhage within the basal cisterns and ventricular system. Evaluation of the basal cisterns and cortical sulci limited by residual contrast from angiogram. Increased hydrocephalus with increased sulcal effacement. Electronically Signed   By: Macy Mis M.D.   On:  06/29/2020 14:13   CT ANGIO NECK W OR WO CONTRAST  Result Date: 06/29/2020 CLINICAL DATA:  Subarachnoid hemorrhage. EXAM: CT ANGIOGRAPHY HEAD AND NECK TECHNIQUE: Multidetector CT imaging of the head and neck was performed using the standard protocol during bolus administration of intravenous contrast. Multiplanar CT image reconstructions and MIPs were obtained to evaluate the vascular anatomy. Carotid stenosis measurements (when applicable) are obtained utilizing NASCET criteria, using the distal internal carotid diameter as the denominator. CONTRAST:  52mL OMNIPAQUE IOHEXOL 350 MG/ML SOLN COMPARISON:  CTA of the head 06/19/2012 FINDINGS: CTA NECK FINDINGS Aortic arch: Normal.  Three vessel branching. Right carotid system: Vessels are smooth and widely patent. Left carotid system: Vessels are smooth and widely patent. Vertebral arteries: Vessels are smooth and widely patent. Skeleton: Disc arthroplasty at C6-7. Other neck: No evidence of inflammation or mass. Upper chest: Negative Review of the MIP images confirms the above findings CTA HEAD FINDINGS Anterior circulation: Mild calcification at the anterior genu of the right cavernous ICA. No branch occlusion, beading, or aneurysm. Some arteries are partially obscured by venous contamination. Posterior circulation: The vertebral and basilar arteries are smoothly contoured and widely patent. The left PICA origin is not well demonstrated and there is a subsequent tiny bulbous appearance, see 8:151. No visible vascular malformation or venous shunting. Venous sinuses: Diffusely patent Anatomic variants: None significant Review of the MIP images confirms the above findings IMPRESSION: No definite source of subarachnoid hemorrhage but there poor visualization of the proximal left PICA with a tiny bulbous area, question ruptured dissection. Electronically Signed   By: Monte Fantasia M.D.   On: 06/29/2020 07:08   CT C-SPINE NO CHARGE  Result Date:  06/29/2020 CLINICAL DATA:  Subarachnoid hemorrhage. EXAM: CT CERVICAL SPINE WITHOUT CONTRAST TECHNIQUE: Reformatted images of the cervical spine were generated from CTA of the neck. COMPARISON:  05/18/2019 cervical MRI FINDINGS: Alignment: No listhesis. Skull base and vertebrae: No acute fracture. No primary bone lesion or focal pathologic process. C6-7 disc arthroplasty in expected location. Soft tissues and spinal canal: Known subarachnoid hemorrhage. Reference CTA. Disc levels:  No visible impingement.  Upper chest: Negative IMPRESSION: 1. No acute finding on cervical spine reformats. 2. Normal appearing C6-7 disc arthroplasty. Electronically Signed   By: Monte Fantasia M.D.   On: 06/29/2020 07:47   CT HEAD CODE STROKE WO CONTRAST  Result Date: 06/29/2020 CLINICAL DATA:  Code stroke.  Weakness and slurred speech. EXAM: CT HEAD WITHOUT CONTRAST TECHNIQUE: Contiguous axial images were obtained from the base of the skull through the vertex without intravenous contrast. COMPARISON:  Brain MRI 05/18/2019 FINDINGS: Brain: Subarachnoid hemorrhage the foramen magnum and prepontine cisterns with intraventricular reflux into the lateral ventricles. Lateral ventriculomegaly. No visible infarct or mass. Vascular: Negative by noncontrast technique. Skull: No acute or traumatic finding Sinuses/Orbits: Negative Other: Critical Value/emergent results were called by telephone at the time of interpretation on 06/29/2020 at 6:36 am to provider Dr Rory Percy , who verbally acknowledged these results. ASPECTS [facility] Stroke Program Early CT Score) - Ganglionic level infarction (caudate, lentiform nuclei, internal capsule, insula, M1-M3 cortex): Not scored in this setting IMPRESSION: Subarachnoid hemorrhage in the posterior fossa with aneurysmal pattern. There is intraventricular reflux and ventriculomegaly. Electronically Signed   By: Monte Fantasia M.D.   On: 06/29/2020 06:53   IR ANGIO INTRA EXTRACRAN SEL INTERNAL CAROTID  BILAT MOD SED  Result Date: 06/29/2020 PROCEDURE: DIAGNOSTIC CEREBRAL ANGIOGRAM HISTORY: The patient is a 50 year old woman presents to the emergency department after sudden onset of severe headache and speech difficulty, brought in by family. Initial CT scan demonstrated subarachnoid hemorrhage with extension into primarily the fourth ventricle. CT angiogram did not demonstrate any intracranial aneurysms. Patient presents now for further work-up with diagnostic cerebral angiogram. ACCESS: The technical aspects of the procedure as well as its potential risks and benefits were reviewed with the patient and her husband. These risks included but were not limited bleeding, infection, allergic reaction, damage to organs or vital structures, stroke, non-diagnostic procedure, and the catastrophic outcomes of heart attack, coma, and death. With an understanding of these risks, informed consent was obtained and witnessed. The patient was placed in the supine position on the angiography table and the skin of right groin prepped in the usual sterile fashion. The procedure was performed under local anesthesia (1%-solution of bicarbonate-buffered Lidocaine) and conscious sedation monitored by the anesthesia service. A 5- French sheath was introduced in the right common femoral artery using Seldinger technique. A fluoro-phase sequence was used to document the sheath position. MEDICATIONS: HEPARIN: 0 Units total. CONTRAST:  90mL OMNIPAQUE IOHEXOL 300 MG/ML  SOLNcc, Omnipaque 300 FLUOROSCOPY TIME:  FLUOROSCOPY TIME: See IR records TECHNIQUE: CATHETERS AND WIRES 5-French JB-1 catheter 0.035" glidewire VESSELS CATHETERIZED Right internal carotid Left internal carotid Left vertebral Right vertebral Right common femoral VESSELS STUDIED Right internal carotid, head Left internal carotid, head Left vertebral Right vertebral Right femoral PROCEDURAL NARRATIVE A 5-Fr JB-1 catheter was advanced over a 0.035 glidewire into the aortic  arch. The above vessels were then sequentially catheterized and cervical / cerebral angiograms taken. After review of images, the catheter was removed without incident. FINDINGS: Right internal carotid, head: Injection reveals the presence of a widely patent ICA, M1, and A1 segments and their branches. No aneurysms, AVMs, or high-flow fistulas are seen. There is no vasospasm of the right carotid circulation. The parenchymal and venous phases are normal. The venous sinuses are widely patent. Left internal carotid, head: Injection reveals the presence of a widely patent ICA, A1, and M1 segments and their branches. No aneurysms, AVMs, or high-flow fistulas are seen. There is no vasospasm of the  left carotid circulation. The parenchymal and venous phases are normal. The venous sinuses are widely patent. Left vertebral: Injection reveals the presence of a widely patent vertebral artery. This leads to a widely patent basilar artery that terminates in bilateral P1. The basilar apex is normal. No aneurysms, AVMs, or high-flow fistulas are seen. The PICA origin is visualized without any aneurysm identified. There is no luminal irregularity to suggest dissection. No posterior circulation vasospasm is seen. The parenchymal and venous phases are normal. The venous sinuses are widely patent. Right vertebral: The vertebral artery is widely patent. No PICA aneurysm is seen. There is no evidence of dissection. See basilar description above. Right femoral: Normal vessel. No significant atherosclerotic disease. Arterial sheath in adequate position. DISPOSITION: Upon completion of the study, the femoral sheath was removed and hemostasis obtained using a 5-Fr ExoSeal closure device. Good proximal and distal lower extremity pulses were documented upon achievement of hemostasis. The procedure was well tolerated and no early complications were observed. The patient was transferred to the holding area to lay flat for 2 hours. IMPRESSION:  1. Normal angiogram without intracranial aneurysm, AVM, or fistula. No vertebral artery dissection is identified. The preliminary results of this procedure were shared with the patient's family. Electronically Signed   By: Consuella Lose   On: 06/29/2020 14:01   IR ANGIO VERTEBRAL SEL VERTEBRAL BILAT MOD SED  Result Date: 06/29/2020 PROCEDURE: DIAGNOSTIC CEREBRAL ANGIOGRAM HISTORY: The patient is a 50 year old woman presents to the emergency department after sudden onset of severe headache and speech difficulty, brought in by family. Initial CT scan demonstrated subarachnoid hemorrhage with extension into primarily the fourth ventricle. CT angiogram did not demonstrate any intracranial aneurysms. Patient presents now for further work-up with diagnostic cerebral angiogram. ACCESS: The technical aspects of the procedure as well as its potential risks and benefits were reviewed with the patient and her husband. These risks included but were not limited bleeding, infection, allergic reaction, damage to organs or vital structures, stroke, non-diagnostic procedure, and the catastrophic outcomes of heart attack, coma, and death. With an understanding of these risks, informed consent was obtained and witnessed. The patient was placed in the supine position on the angiography table and the skin of right groin prepped in the usual sterile fashion. The procedure was performed under local anesthesia (1%-solution of bicarbonate-buffered Lidocaine) and conscious sedation monitored by the anesthesia service. A 5- French sheath was introduced in the right common femoral artery using Seldinger technique. A fluoro-phase sequence was used to document the sheath position. MEDICATIONS: HEPARIN: 0 Units total. CONTRAST:  76mL OMNIPAQUE IOHEXOL 300 MG/ML  SOLNcc, Omnipaque 300 FLUOROSCOPY TIME:  FLUOROSCOPY TIME: See IR records TECHNIQUE: CATHETERS AND WIRES 5-French JB-1 catheter 0.035" glidewire VESSELS CATHETERIZED Right  internal carotid Left internal carotid Left vertebral Right vertebral Right common femoral VESSELS STUDIED Right internal carotid, head Left internal carotid, head Left vertebral Right vertebral Right femoral PROCEDURAL NARRATIVE A 5-Fr JB-1 catheter was advanced over a 0.035 glidewire into the aortic arch. The above vessels were then sequentially catheterized and cervical / cerebral angiograms taken. After review of images, the catheter was removed without incident. FINDINGS: Right internal carotid, head: Injection reveals the presence of a widely patent ICA, M1, and A1 segments and their branches. No aneurysms, AVMs, or high-flow fistulas are seen. There is no vasospasm of the right carotid circulation. The parenchymal and venous phases are normal. The venous sinuses are widely patent. Left internal carotid, head: Injection reveals the presence of a widely patent  ICA, A1, and M1 segments and their branches. No aneurysms, AVMs, or high-flow fistulas are seen. There is no vasospasm of the left carotid circulation. The parenchymal and venous phases are normal. The venous sinuses are widely patent. Left vertebral: Injection reveals the presence of a widely patent vertebral artery. This leads to a widely patent basilar artery that terminates in bilateral P1. The basilar apex is normal. No aneurysms, AVMs, or high-flow fistulas are seen. The PICA origin is visualized without any aneurysm identified. There is no luminal irregularity to suggest dissection. No posterior circulation vasospasm is seen. The parenchymal and venous phases are normal. The venous sinuses are widely patent. Right vertebral: The vertebral artery is widely patent. No PICA aneurysm is seen. There is no evidence of dissection. See basilar description above. Right femoral: Normal vessel. No significant atherosclerotic disease. Arterial sheath in adequate position. DISPOSITION: Upon completion of the study, the femoral sheath was removed and hemostasis  obtained using a 5-Fr ExoSeal closure device. Good proximal and distal lower extremity pulses were documented upon achievement of hemostasis. The procedure was well tolerated and no early complications were observed. The patient was transferred to the holding area to lay flat for 2 hours. IMPRESSION: 1. Normal angiogram without intracranial aneurysm, AVM, or fistula. No vertebral artery dissection is identified. The preliminary results of this procedure were shared with the patient's family. 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 Does the need for close, 24 hr/day medical supervision in concert with the patient's rehab needs make it unreasonable for this patient to be served in a less intensive setting? Yes 2. Co-Morbidities requiring supervision/potential complications: <upus, GERD, HTN, IBS, SAH- generalized weakness 3. Due to bladder management, bowel management, safety, skin/wound care, disease management, medication administration, pain management and patient education, does the patient require 24 hr/day rehab nursing? Yes 4. Does the patient require coordinated care of a physician, rehab nurse, therapy disciplines of PT, OT and maybe SLP at least for higher level cognition to address physical and functional deficits in the context of the above medical diagnosis(es)? Yes Addressing deficits in the following areas: balance, endurance, locomotion, strength, transferring, bowel/bladder control, bathing, dressing, feeding, grooming, toileting, cognition and psychosocial support 5. Can the patient actively participate in an intensive therapy program of at least 3 hrs of therapy per day at least 5 days per week? Yes 6. The potential for patient to make measurable gains while on inpatient rehab is good 7. Anticipated functional outcomes upon discharge from inpatient rehab are  supervision and min assist  with PT, supervision and min assist with OT, modified independent and supervision with SLP. 8. Estimated rehab length of stay to reach the above functional goals is: 14-18 days 9. Anticipated discharge destination: Home 10. Overall Rehab/Functional Prognosis: good  RECOMMENDATIONS: This patient's condition is appropriate for continued rehabilitative care in the following setting: CIR Patient has agreed to participate in recommended program. Potentially Note that insurance prior authorization may be required for reimbursement for recommended care.  Comment: Pt IS Appropriate for inpt rehab- however also has some other medical recommendations.    1. Suggest muscle relaxant- for neck pain- like Flexeril- 10 mg TID prn- if too sedating then Skelaxin has no Ach effects and MUCH less sedating- 800 mg TID prn.  2. Lidoderm patches 2 patches 8pm to 8am - on each side of neck -covering scalenes,  levators/upper traps, that are VERY tight-  In combo with muscle relaxants, should help neck pain.  3. If doesn't help- and still having HA's daily, suggest Topamax 50 mg QHS and then can titrate up slowly, for HA prevention.  4. LBM ~ 1 week ago, per pt- she thinks it was last Tuesday, so suggest KUB and then if full, give Sorbitol.  5. Will submit for admissions coordinators to send to insurance.  6. Thank you for consult.    Bary Leriche, PA-C 06/30/2020          Revision History                   Routing History                   Note Details  Author Courtney Heys, MD File Time 06/30/2020 5:17 PM  Author Type Physician Status Signed  Last Editor Courtney Heys, MD Service [facility] [place] # [id number] Admit Date 07/09/2020

## 2020-07-09 NOTE — Progress Notes (Signed)
  Speech Language Pathology Treatment:    Patient Details Name: Amy Barry MRN: [id number] DOB: 09-26-1970 Today's Date: 07/09/2020 Time:  -     Assessment / Plan / Recommendation Clinical Impression    Pt seen for cognitive treatment while in chair with daughter Amy Barry present. Treatment targeted problem solving mildly complex scenarios involving time (times for pain meds, appointments). She needed cues to retain parts of information for multi-step problem/situattion. Retrieval of information is reduced and pt stated solution for written information so "I can remember what happened during the day." Therapist organized sheets with daily logs and paper to record activities of her day. Her emergent awareness is improving as she stated "why am I having trouble with things I could do so easily before?" Pt to continue treatment on acute then CIR.     HPI    Amy Barry is a 50 y.o. female with GERD, lupus, HTN, heart murmor who presented to the ED with altered mental status and incomprehensible speech. 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

## 2020-07-09 NOTE — H&P (Signed)
Physical Medicine and Rehabilitation Admission H&P    Chief Complaint  Patient presents with  .  Functional deficits due to SAH/hydrocephalus.    HPI: Amy Barry is a 50 year old female with history of HTN, IBS, anemia, connective tissue disorder-on MTX/Plaquenil, family history of cerebral aneurysms who was admitted on 06/29/2020 after found down by daughter with slurred speech and confusion.  She was found to have [place] and posterior fossa felt to be aneurysmal in nature.  CTA head/neck did not show a definite source of bleed but had poor visualization of proximal left-ICA with tiny bulbous area and question of rupture or dissection.  She was evaluated by Dr. Kathyrn Sheriff and underwent cerebral angio with placement of right frontal ventricular drain and was negative for AVM, aneurysm, fistula or dissection.  Hospital course significant for issues with headaches with neck stiffness and upper back spasms, development small hematoma along course of drain in right frontal lobe as well as hypoglycemia.    SAH and IVH have resolved and intraventricular drain removed on 04/06 as follow-up CT without hydrocephalus.  Repeat cerebral angiogram on 04/04 was negative for aneurysm, AVM or fistula.  She has had issues with progressive hyponatremia with drop in sodium to 127 and transient hypokalemia improved with supplementation.  Lethargy is resolving with improvement in mentation and participation, cognitive deficits with delay in processing, needs multimodal cues to follow one-step commands, balance deficits with tendency to drift to the left.  Patient with deficits in mobility and ability to carry out ADLs.  CIR was recommended due to functional decline.  Pt reports she has had neck pain/tight muscles and feels there must be something that hasn't been dx'd- I explained it appears to be myofascial pain- and it's usually due to the underlying cause requiring cervical fusion.  She also noted she's had  chronic bladder incontinence- going all the time, but has new bowel incontinence. Still has a bad HA- as well as neck and shoulder pain (strigger points noted).  Also has intermittent numbness on the L half of her body- for a few hours at a time.  Has rectal tube right now.  Had a purewick- doesn't appear to be in place currently.  Also gets spasms "all over" and is fidgety currently- says she doesn't think she can make a "rational decision right now".   Review of Systems  Constitutional: Negative for chills and fever.  HENT: Negative for hearing loss and tinnitus.   Eyes: Negative for blurred vision and double vision.  Cardiovascular: Negative for chest pain and palpitations.  Gastrointestinal: Negative for heartburn and nausea.  Genitourinary: Negative for dysuria and urgency.       Now having problems with bowel incontinence.   Musculoskeletal: Positive for back pain, myalgias and neck pain.       Constant hip pain  Skin: Negative for itching and rash.  Neurological: Positive for weakness and headaches. Negative for dizziness.       1-2 X a month gets numbness left hemibody.  Light sensitivity/HA now.   Psychiatric/Behavioral: The patient is nervous/anxious.   All other systems reviewed and are negative.     Past Medical History:  Diagnosis Date  . Arthralgia   . Generalized weakness    bilateral upper and lower extremities,  walks with cane  . GERD (gastroesophageal reflux disease)   . Headache(784.0)   . Heart murmur    per pt "since childhood, no problems"  . History of kidney stones   . Hypertension  followed pcp  . IBS (irritable bowel syndrome)   . Iron deficiency anemia    12-06-2017 per pt recently had IV iron infusion July 2019  . Low vitamin D level   . Osteoarthritis   . PVC (premature ventricular contraction)    per pt had holter monitor  . SLE (systemic lupus erythematosus) ([place])    rheumotologist-- dr syed  . Wears glasses     Past Surgical  History:  Procedure Laterality Date  . CERVICAL DISC ARTHROPLASTY N/A 10/23/2015   Procedure: Cervical Disc Arthroplasty Cervical six-seven;  Surgeon: Eustace Moore, MD;  Location: [facility] [facility];  Service: Neurosurgery;  Laterality: N/A;  . D & C HYSTEROSCOPY W/ RESECTION ENDOMETRIAL POLYP  10-13-2003    dr rivard @[facility]   . ESOPHAGOGASTRODUODENOSCOPY     for GERD  . IR ANGIO INTRA EXTRACRAN SEL COM CAROTID INNOMINATE BILAT MOD SED  07/06/2020  . IR ANGIO INTRA EXTRACRAN SEL INTERNAL CAROTID BILAT MOD SED  06/29/2020  . IR ANGIO VERTEBRAL SEL VERTEBRAL BILAT MOD SED  06/29/2020  . LAPAROSCOPIC ABDOMINAL EXPLORATION  1996   dx ibs  . OVARIAN CYST SURGERY  age 51   x2 cyst  . RADIOLOGY WITH ANESTHESIA N/A 06/29/2020   Procedure: IR WITH ANESTHESIA;  Surgeon: Radiologist, Medication, MD;  Location: [facility];  Service: Radiology;  Laterality: N/A;  . ROBOTIC ASSISTED TOTAL HYSTERECTOMY N/A 12/13/2017   Procedure: XI ROBOTIC ASSISTED TOTAL HYSTERECTOMYWITH BILATERAL SALPINGECTOMY;  Surgeon: Christophe Louis, MD;  Location: [facility];  Service: Gynecology;  Laterality: N/A;  . TUBAL LIGATION Bilateral 06-01-2002   dr Mancel Bale @[facility]    PPTL   Family History  Problem Relation Age of Onset  . Arthritis Other   . Hyperlipidemia Other   . Hypertension Daughter   . Colon polyps Daughter 9       hermatoma oversized polyps-benign  . Stroke Other   . Leukemia Maternal Grandmother   . Heart disease Maternal Grandmother   . Asthma Sister   . Hypertension Mother        hx aneursym and surgery  . Atrial fibrillation Mother   . Sarcoidosis Mother   . Healthy Father   . Colon cancer Neg Hx     Social History:  Works as a Marine scientist for [employer].  reports that she has never smoked. She has never used smokeless tobacco. She reports that she does not drink alcohol and does not use drugs.    Allergies  Allergen Reactions  . Imitrex [Sumatriptan] Other (See Comments)    High blood pressure  . Demerol [Meperidine] Nausea And Vomiting   . Oxycodone Other (See Comments)    DROWSY  . Reglan [Metoclopramide] Other (See Comments)    "shakiness"  . Tramadol Nausea Only  . Codeine Palpitations  . Propoxyphene N-Acetaminophen Nausea And Vomiting    Medications Prior to Admission  Medication Sig Dispense Refill  . amLODipine (NORVASC) 5 MG tablet Take 5 mg by mouth daily.    Marland Kitchen docusate sodium (COLACE) 50 MG capsule Take 50 mg by mouth daily as needed (for constipation.).    Marland Kitchen folic acid (FOLVITE) 1 MG tablet Take 1 mg by mouth daily.    . furosemide (LASIX) 40 MG tablet Take 1 tablet (40 mg total) by mouth daily. 90 tablet 3  . gabapentin (NEURONTIN) 300 MG capsule Take 300 mg by mouth 3 (three) times daily as needed (pain).    . hydroxychloroquine (PLAQUENIL) 200 MG tablet Take 200 mg by mouth 2 (two)  times daily.   1  . methotrexate 2.5 MG tablet Take 5 mg by mouth once a week. Friday    . metoprolol (LOPRESSOR) 100 MG tablet TAKE ONE TABLET BY MOUTH TWICE DAILY (Patient taking differently: Take 100 mg by mouth 2 (two) times daily.) 180 tablet 3  . Multiple Vitamin (MULTIVITAMIN WITH MINERALS) TABS Take 1 tablet by mouth daily.     Marland Kitchen niMODipine (NYMALIZE) 6 MG/ML SOLN Take 10 mLs (60 mg total) by mouth every 4 (four) hours for 11 days. 660 mL 0  . omeprazole (PRILOSEC) 40 MG capsule Take 40 mg by mouth daily as needed (acid reflux).    . ondansetron (ZOFRAN) 4 MG tablet Take 1 tablet (4 mg total) by mouth every 6 (six) hours as needed for nausea. 20 tablet 0  . pantoprazole (PROTONIX) 40 MG tablet Take 40 mg by mouth daily as needed (for acid reflux/indigestion).     . potassium chloride (K-DUR) 10 MEQ tablet TAKE ONE TABLET BY MOUTH ONCE DAILY (Patient taking differently: Take 10 mEq by mouth daily.) 90 tablet 3  . TURMERIC PO Take 1 capsule by mouth daily.      Drug Regimen Review  Drug regimen was reviewed and remains appropriate with no significant issues identified  Home: Home Living Family/patient expects to be  discharged to:: Private residence Living Arrangements: Spouse/significant other   Functional History:    Functional Status:  Mobility:          ADL:    Cognition:      Physical Exam: Blood pressure (!) 149/79, pulse 86, temperature 100.1 F (37.8 C), resp. rate 17, height 5\' 4"  (1.626 m), weight 82.9 kg, last menstrual period 12/22/2017, SpO2 100 %. Physical Exam Vitals and nursing note reviewed. Exam conducted with a chaperone present.  Constitutional:      Appearance: Normal appearance.     Comments: Tends to keep neck flexed to left. Few scalp stapled right scalp with some bogginess. Restless with light sensitivity.  Also very fidgety-cannot keep still- very talkative, sitting up somewhat in bed, fallen to the left, NAD   HENT:     Head: Normocephalic.     Comments: R frontal area shaved with a few staples intact- has a little puffiness of that skull tissue Equal smile- tongue midline    Right Ear: External ear normal.     Left Ear: External ear normal.     Nose: Nose normal. No congestion.     Mouth/Throat:     Mouth: Mucous membranes are dry.     Pharynx: Oropharynx is clear. No oropharyngeal exudate.  Eyes:     General:        Right eye: No discharge.     Extraocular Movements: Extraocular movements intact.     Conjunctiva/sclera: Conjunctivae normal.  Neck:     Comments: Tight muscles/trigger points in upper traps, levators, splenius capitus as well as scalenes B/L Cardiovascular:     Rate and Rhythm: Normal rate and regular rhythm.     Heart sounds: Normal heart sounds. No murmur heard. No gallop.   Pulmonary:     Effort: Pulmonary effort is normal.     Breath sounds: Normal breath sounds.     Comments: CTA B/L- no W/R/R- good air movement  Abdominal:     Comments: Soft, NT, ND, (+)BS  - slightly protuberant- has rectal tube in place- very little in rectal bag- just dregs of liquid stool  Genitourinary:    Comments: Rectal tube in  place Musculoskeletal:     Comments: UE's 5-5/ in biceps, triceps, WE grip and finger abd B/L LEs- HF and KE/KF 2/5- tried to kick out weakly a few times and then too tired to sustain/try further DF and PF 5/5 B/L of note  Skin:    General: Skin is warm and dry.     Comments: No LE edema L forearm IV- looks OK No skin breakdown on heels nor her buttocks/privates  Neurological:     Mental Status: She is alert and oriented to person, place, and time.     Comments: Very talkative; hard to interact Fidgety the entire time LT intact in all 4 extremities, but c/o numbness in local spot- L mid back   Psychiatric:     Comments: Fidgety- "not making rational decisions" per pt; somewhat emotional/up and down     Results for orders placed or performed during the hospital encounter of 06/29/20 (from the past 48 hour(s))  Glucose, capillary     Status: Abnormal   Collection Time: 07/07/20  5:04 PM  Result Value Ref Range   Glucose-Capillary 117 (H) 70 - 99 mg/dL    Comment: Glucose reference range applies only to samples taken after fasting for at least 8 hours.  Glucose, capillary     Status: Abnormal   Collection Time: 07/07/20 10:58 PM  Result Value Ref Range   Glucose-Capillary 120 (H) 70 - 99 mg/dL    Comment: Glucose reference range applies only to samples taken after fasting for at least 8 hours.  Glucose, capillary     Status: Abnormal   Collection Time: 07/08/20  7:33 AM  Result Value Ref Range   Glucose-Capillary 119 (H) 70 - 99 mg/dL    Comment: Glucose reference range applies only to samples taken after fasting for at least 8 hours.  Glucose, capillary     Status: None   Collection Time: 07/08/20 11:55 AM  Result Value Ref Range   Glucose-Capillary 99 70 - 99 mg/dL    Comment: Glucose reference range applies only to samples taken after fasting for at least 8 hours.  Glucose, capillary     Status: Abnormal   Collection Time: 07/08/20  4:36 PM  Result Value Ref Range    Glucose-Capillary 108 (H) 70 - 99 mg/dL    Comment: Glucose reference range applies only to samples taken after fasting for at least 8 hours.  Glucose, capillary     Status: Abnormal   Collection Time: 07/08/20  9:06 PM  Result Value Ref Range   Glucose-Capillary 118 (H) 70 - 99 mg/dL    Comment: Glucose reference range applies only to samples taken after fasting for at least 8 hours.  Glucose, capillary     Status: Abnormal   Collection Time: 07/09/20  6:54 AM  Result Value Ref Range   Glucose-Capillary 101 (H) 70 - 99 mg/dL    Comment: Glucose reference range applies only to samples taken after fasting for at least 8 hours.  Glucose, capillary     Status: Abnormal   Collection Time: 07/09/20 11:52 AM  Result Value Ref Range   Glucose-Capillary 117 (H) 70 - 99 mg/dL    Comment: Glucose reference range applies only to samples taken after fasting for at least 8 hours.   CT HEAD WO CONTRAST  Result Date: 07/08/2020 CLINICAL DATA:  Headache with right frontal hemorrhage noted 1 day prior EXAM: CT HEAD WITHOUT CONTRAST TECHNIQUE: Contiguous axial images were obtained from the base of the skull  through the vertex without intravenous contrast. COMPARISON:  July 07, 2020 and June 29, 2020 FINDINGS: Brain: There is a shunt catheter from a right frontal approach with tip in third ventricle, unchanged. There is hemorrhage with surrounding edema in the right frontal lobe region, unchanged from 1 day prior. The focus of hemorrhage measures 2.1 x 2.0 cm, stable. No new area of hemorrhage. Currently there is no appreciable ventricular dilatation. Sulci appear unremarkable. No appreciable mass. No subdural or epidural fluid collections. No midline shift. Note that previously noted subarachnoid hemorrhage seen on the March 2022 study is no longer evident with brain parenchyma appearance unchanged from 1 day prior. No findings suggesting acute infarct. Vascular: No hyperdense vessel. Slight calcification noted in  the carotid siphon regions. Skull: The bony calvarium appears intact except for bony defect for shunt placement in the right frontal region. Sinuses/Orbits: Paranasal sinuses are clear. Orbits appear symmetric bilaterally. Other: Mastoid air cells clear. IMPRESSION: Shunt catheter tip in third ventricle without hydrocephalus. Ventricles appear unchanged compared to 1 day prior. No sulcal enlargement. Hemorrhage in the right frontal lobe surrounding a portion of the catheter is stable. No new hemorrhage evident. Interval resolution of subarachnoid hemorrhage. Gray-white differentiation unremarkable. No acute infarct evident. No extra-axial fluid collection or midline shift. Slight arterial vascular calcification noted. Electronically Signed   By: Lowella Grip III M.D.   On: 07/08/2020 08:41       Medical Problem List and Plan: 1.  Impaired cognition/mobility and ADLs secondary to [facility]- unknown cause (but says she took "tons" of ibuprofen)  -patient may  Shower if covers head/staples  -ELOS/Goals: 14-18 days- supervision to min A 2.  Antithrombotics: -DVT/anticoagulation:  Mechanical: Sequential compression devices, below knee Bilateral lower extremities  -antiplatelet therapy: N/A 3. Pain Management: Was using NSAIDS tid at home  --will continue Oxycodone as needed 4. Mood: LCSW to follow for evaluation and support.    -antipsychotic agents: N/A 5. Neuropsych: This patient is not fully capable of making decisions on her own behalf. 6. Skin/Wound Care: Routine pressure-relief measures.  7. Fluids/Electrolytes/Nutrition: Monitor I/O. Check lytes in am.  8. Hyponatremia:  Sodium with downward trend--127 today.  --Start 1500 cc FR with strict I/O.  --Check CMET in am.  9.  Seizure prophylaxis: Continue Keppra twice daily.   10. HTN/Tachycardia: Monitor blood pressures 3 times daily.  --Continue Nimotop every 4 hours to prevent vasospasms.  --Continue metoprolol.  Resume amlodipine to replace  lasix.    --Will DC Lasix due to hyponatremia and titrate medication as indicated. 11.  Recurrent hypokalemia: Will Kdur for supplementation daily. 12.  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 (Had intermittent weakness with RLE with foot drop progressing to left sided weakness--needed therapy X 2 years for recovery)  --Was weaning off methotrexate--will d/c per patient request.  --was off plaquenil due to diarrhea. Will taper to daily X 7 to d/c.   --Resume gabapentin/K pad to help with myalgias.  13.  Stress related hyperglycemia: Hgb A1c-5.4.  Continue to monitor blood sugars AC/at bedtime.  --Use sliding scale insulin for elevated blood sugars. 14.  Hypocalcemia: Add calcium for oral supplementation 15. Bowel incontinence: Discontinue colace. Monitor as plaquenil weans off.  Suggest removal of rectal tube. Suggest migh tneed bowel program, per pt request? 16. Fevers: Low grade fevers in the past 12 hours. Monitor--will order dopplers and CBC with diff for now. Sepsis protocol prn T>100.5.    Bary Leriche- PA-C 07/09/2020   I have personally performed  a face to face diagnostic evaluation of this patient and formulated the key components of the plan.  Additionally, I have personally reviewed laboratory data, imaging studies, as well as relevant notes and concur with the physician assistant's documentation above.   The patient's status has not changed from the original H&P.  Any changes in documentation from the acute care chart have been noted above.     Courtney Heys, MD 07/09/2020

## 2020-07-09 NOTE — Progress Notes (Signed)
  NEUROSURGERY PROGRESS NOTE   Pt seen and examined. No issues overnight. Unchanged HA  EXAM: Temp:  [98.7 F (37.1 C)-100.5 F (38.1 C)] 100.4 F (38 C) (04/07 0800) Pulse Rate:  [72-95] 95 (04/07 1000) Resp:  [13-28] 13 (04/07 1000) BP: (120-163)/(61-139) 140/79 (04/07 1000) SpO2:  [95 %-100 %] 100 % (04/07 1000) Intake/Output      04/06 0701 04/07 0700 04/07 0701 04/08 0700   P.O. 360    I.V. (mL/kg) 1575.6 (18.4) 98.1 (1.1)   Total Intake(mL/kg) 1935.6 (22.6) 98.1 (1.1)   Urine (mL/kg/hr) 950 (0.5)    Stool 0    Total Output 950    Net +985.6 +98.1        Urine Occurrence 2 x    Stool Occurrence 5 x 1 x    Awake, alert, oriented Speech fluent, appropriate CN grossly intact MAE well EVD site c/d/i  LABS: Lab Results  Component Value Date   CREATININE 0.56 07/07/2020   BUN 6 07/07/2020   NA 127 (L) 07/07/2020   K 3.4 (L) 07/07/2020   CL 94 (L) 07/07/2020   CO2 21 (L) 07/07/2020   Lab Results  Component Value Date   WBC 6.9 06/29/2020   HGB 15.0 06/29/2020   HCT 44.0 06/29/2020   MCV 95.6 06/29/2020   PLT 225 06/29/2020    IMPRESSION: - 50 y.o. female SAH d# 11, angio negative. Remains neurologically intact - Mild hyponatremia  PLAN: - Stable for d/c to CIR, or transfer to 4NP if no bed available - Cont salt tabs - Plan on cont Nimotop to complete 21 days of treatment   Consuella Lose, MD [facility]

## 2020-07-09 NOTE — Progress Notes (Signed)
[name], RN  Rehab Admission Coordinator  Physical Medicine and Rehabilitation  PMR Pre-admission    Signed  Date of Service:  07/01/2020 12:53 PM      Related encounter: ED to Hosp-Admission (Discharged) from 06/29/2020 in [facility]UR[facility]/TRAUMA/SURGICAL ICU       Signed          Show:Clear all _0 Manual_1 Template_2 Copied  Added by: _3 [name], RN_4 Genella Mech, CCC-SLP   _5 Hover for details  PMR Admission Coordinator Pre-Admission Assessment  Patient: Amy Barry is an 50 y.o., female MRN: [id number] DOB: 12-Aug-1970 Height: _6  (162.6 cm) Weight: 85.7 kg                                                                                                                                                  Insurance Information HMO:     PPO:      PCP:      IPA:      80/20:      OTHER: HSA Aetna Choice POS II PRIMARY: Comptroller      Policy#: [id number]      Subscriber: Pt CM Name: [place]      Phone#: [phone]     Fax#: [phone] Pre-Cert#: [id number]      Employer: [employer] Benefits:  Phone #: [phone]     Name: [email or web address] Eff. Date: 02/07/16     Deduct: [phone] (met $3302.83)      Out of Pocket Max: [phone] remaining)      Life Max: N/A  CIR: 80% coverage with 20% coinsurance      SNF: 80% coverage with 20% coinsurance Outpatient: 80%     Co-Pay: 20% Home Health: 80%      Co-Pay: 20% DME: 80%     Co-Pay: 20% Providers: in network  SECONDARY: Cigna Managed    Policy#: [id number]      Phone#: [phone]  Financial Counselor:       Phone#:   The "Data Collection Information Summary" for patients in Inpatient Rehabilitation Facilities with attached "Privacy Act [place] Records" was provided and verbally reviewed with: Patient  Emergency Contact Information         Contact Information    Name Relation Home Work Carlton L [place] Daughter    [phone]     Current Medical History  Patient Admitting Diagnosis:SAH  History of Present Illness: Amy Barry a 50 y.o.femalewith history of HTN, IBS, anemia,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 CTA head neck did not show definate source of bleed but had poor visualization of proximal L-ICA with tiny bulbous area question  ruptured dissection. She was evaluated by NS and underwent cerebral angio with placement of right frontal ventric drain by Dr. Kathyrn Sheriff. This was negative for AVN, aneurysm, fistula or dissection. She continues on nimotop with TCD to monitor for vasospasms and SBP goal<140. Therapy evaluations completed today revealing limitations due to pain, weakness and balance deficits. CIR recommended due to functional decline.  Complete NIHSS TOTAL: 0 Glasgow Coma Scale Score: 15  Past Medical History      Past Medical History:  Diagnosis Date  . Arthralgia   . Generalized weakness    bilateral upper and lower extremities,  walks with cane  . GERD (gastroesophageal reflux disease)   . Headache(784.0)   . Heart murmur    per pt "since childhood, no problems"  . History of kidney stones   . Hypertension    followed pcp  . IBS (irritable bowel syndrome)   . Iron deficiency anemia    12-06-2017 per pt recently had IV iron infusion July 2019  . Low vitamin D level   . Osteoarthritis   . PVC (premature ventricular contraction)    per pt had holter monitor  . SLE (systemic lupus erythematosus) ([place])    rheumotologist-- dr syed  . Wears glasses     Family History  family history includes Arthritis in an other family member; Asthma in her sister; Atrial fibrillation in her mother; Colon polyps (age of onset: 28) in her daughter;  Healthy in her father; Heart disease in her maternal grandmother; Hyperlipidemia in an other family member; Hypertension in her daughter and mother; Leukemia in her maternal grandmother; Sarcoidosis in her mother; Stroke in an other family member.  Prior Rehab/Hospitalizations:  Has the patient had prior rehab or hospitalizations prior to admission? No  Has the patient had major surgery during 100 days prior to admission? Yes  Current Medications   Current Facility-Administered Medications:  .   stroke: mapping our early stages of recovery book, , Does not apply, Once, Costella, Vincent J, PA-C .  0.9 %  sodium chloride infusion, , Intravenous, Continuous, Consuella Lose, MD, Stopped at 07/09/20 [phone] .  acetaminophen (TYLENOL) tablet 650 mg, 650 mg, Oral, Q4H PRN **OR** [DISCONTINUED] acetaminophen (TYLENOL) 160 MG/5ML solution 650 mg, 650 mg, Per Tube, Q4H PRN, 650 mg at 07/01/20 1910 **OR** acetaminophen (TYLENOL) suppository 650 mg, 650 mg, Rectal, Q4H PRN, Costella, Vincent J, PA-C .  acetaminophen-codeine (TYLENOL #3) 300-30 MG per tablet 1-2 tablet, 1-2 tablet, Oral, Q4H PRN, Pham, Minh Q, RPH-CPP, 2 tablet at 07/08/20 2116 .  Chlorhexidine Gluconate Cloth 2 % PADS 6 each, 6 each, Topical, Daily, Consuella Lose, MD, 6 each at 07/09/20 1054 .  cyclobenzaprine (FLEXERIL) tablet 5-10 mg, 5-10 mg, Oral, TID PRN, Pham, Minh Q, RPH-CPP, 10 mg at 07/08/20 0807 .  docusate sodium (COLACE) capsule 100 mg, 100 mg, Oral, BID, Pham, Minh Q, RPH-CPP, 100 mg at 77/11/65 7903 .  folic acid (FOLVITE) tablet 1 mg, 1 mg, Per Tube, Daily, Pham, Minh Q, RPH-CPP, 1 mg at 07/09/20 1054 .  furosemide (LASIX) tablet 40 mg, 40 mg, Oral, Daily, Pham, Minh Q, RPH-CPP, 40 mg at 07/09/20 1054 .  HYDROmorphone (DILAUDID) injection 0.5 mg, 0.5 mg, Intravenous, Q2H PRN, Costella, Vincent J, PA-C, 0.5 mg at 07/09/20 1055 .  hydroxychloroquine (PLAQUENIL) tablet 200 mg, 200 mg, Oral, BID, Pham, Minh Q, RPH-CPP,  200 mg at 07/09/20 1054 .  insulin aspart (novoLOG) injection 0-15 Units, 0-15 Units, Subcutaneous, TID WC, Costella, Vista Mink, PA-C, 2  Units at 07/07/20 1256 .  insulin aspart (novoLOG) injection 0-5 Units, 0-5 Units, Subcutaneous, QHS, Costella, Vincent J, PA-C .  lactated ringers infusion, , Intravenous, Continuous, Roderic Palau, MD .  levETIRAcetam (KEPPRA) 100 MG/ML solution 500 mg, 500 mg, Oral, BID, Consuella Lose, MD, 500 mg at 07/09/20 1055 .  lidocaine (LIDODERM) 5 % 2 patch, 2 patch, Transdermal, Q24H, Bergman, Meghan D, NP, 2 patch at 07/08/20 2046 .  methocarbamol (ROBAXIN) tablet 750 mg, 750 mg, Oral, QID, Costella, Vincent J, PA-C, 750 mg at 07/09/20 1053 .  methotrexate (RHEUMATREX) tablet 5 mg, 5 mg, Oral, Weekly, Pham, Minh Q, RPH-CPP .  metoprolol tartrate (LOPRESSOR) tablet 100 mg, 100 mg, Oral, BID, Pham, Minh Q, RPH-CPP, 100 mg at 07/09/20 1054 .  [DISCONTINUED] niMODipine (NIMOTOP) capsule 60 mg, 60 mg, Oral, Q4H, 60 mg at 07/03/20 2033 **OR** niMODipine (NYMALIZE) 6 MG/ML oral solution 60 mg, 60 mg, Oral, Q4H, Consuella Lose, MD, 60 mg at 07/09/20 1056 .  ondansetron (ZOFRAN-ODT) disintegrating tablet 4 mg, 4 mg, Oral, Q6H PRN **OR** ondansetron (ZOFRAN) injection 4 mg, 4 mg, Intravenous, Q6H PRN, Costella, Vincent J, PA-C, 4 mg at 07/08/20 1218 .  oxyCODONE (Oxy IR/ROXICODONE) immediate release tablet 5-10 mg, 5-10 mg, Oral, Q6H PRN, Consuella Lose, MD, 10 mg at 07/07/20 0453 .  pantoprazole (PROTONIX) EC tablet 40 mg, 40 mg, Oral, Daily, 40 mg at 07/09/20 1055 **OR** [DISCONTINUED] pantoprazole sodium (PROTONIX) 40 mg/20 mL oral suspension 40 mg, 40 mg, Per Tube, Daily, Costella, Vincent J, PA-C, 40 mg at 07/02/20 0900 .  potassium chloride 20 MEQ/15ML (10%) solution 10 mEq, 10 mEq, Oral, Daily, Consuella Lose, MD, 10 mEq at 07/09/20 1055 .  sodium chloride flush (NS) 0.9 % injection 3 mL, 3 mL, Intravenous, Once, Costella, Vincent J, PA-C .  sodium  chloride tablet 1 g, 1 g, Oral, TID WC, Eustace Moore, MD, 1 g at 07/09/20 1057  Patients Current Diet:     Diet Order                  Diet regular Room service appropriate? Yes with Assist; Fluid consistency: Thin  Diet effective now                  Precautions / Restrictions Precautions Precautions: Fall,Other (comment) Precaution Comments: SBP < 140 Restrictions Weight Bearing Restrictions: No   Has the patient had 2 or more falls or a fall with injury in the past year?Yes  Prior Activity Level Limited Community (1-2x/wk): Pt. worked from home but was active in the community PTA  Prior Functional Level Prior Function Level of Independence: Independent with assistive device(s) Comments: had been using a cane but had stopped. Had someone put on socks over weekend and will ask for help from family if having a "bad day"  Self Care: Did the patient need help bathing, dressing, using the toilet or eating?  Independent  Indoor Mobility: Did the patient need assistance with walking from room to room (with or without device)? Independent  Stairs: Did the patient need assistance with internal or external stairs (with or without device)? Independent  Functional Cognition: Did the patient need help planning regular tasks such as shopping or remembering to take medications? Independent  Home Assistive Devices / Equipment Home Assistive Devices/Equipment: None Home Equipment: Laurich - 2 wheels,Cane - single point,Bedside commode  Prior Device Use: Indicate devices/aids used by the patient prior to current illness, exacerbation or injury? Latif  Current Functional Level Cognition  Arousal/Alertness: Lethargic Overall Cognitive Status: Impaired/Different from baseline Current Attention Level: Sustained Orientation Level: Oriented X4 Following Commands: Follows one step commands consistently,Follows one step commands with increased time,Follows multi-step  commands inconsistently Safety/Judgement: Decreased awareness of safety General Comments: Significantly slowed processing, often requires multimodal cues and repetition to perform task. Attention: Sustained Sustained Attention: Impaired Sustained Attention Impairment: Verbal basic Memory:  (will assess further) Awareness: Impaired Awareness Impairment: Anticipatory impairment,Emergent impairment,Intellectual impairment Problem Solving:  (will assess when alertness improves) Safety/Judgment: Impaired    Extremity Assessment (includes Sensation/Coordination)  Upper Extremity Assessment: LUE deficits/detail LUE Deficits / Details: weakness LUE Coordination: decreased fine motor  Lower Extremity Assessment: Defer to PT evaluation LLE Deficits / Details: states "if i sit on toilet too long my left leg goes numb" LLE Sensation: decreased light touch (distally)    ADLs  Overall ADL's : Needs assistance/impaired Eating/Feeding: Minimal assistance,Sitting Grooming: Oral care,Sitting,Set up,Supervision/safety,Cueing for sequencing Grooming Details (indicate cue type and reason): Increased time and cues for initating of tasks Upper Body Bathing: Moderate assistance Lower Body Bathing: Maximal assistance Lower Body Dressing: Moderate assistance,Sit to/from stand Lower Body Dressing Details (indicate cue type and reason): Mod A for initating donning socks and then support to bring BLEs towards EOB Toilet Transfer: Moderate assistance,+2 for physical assistance,+2 for safety/equipment,Ambulation,BSC,RW Toilet Transfer Details (indicate cue type and reason): Mod A +2 to intiating power up and then sequence turn to recliner Toileting- Clothing Manipulation and Hygiene: Maximal assistance,Sit to/from stand Toileting - Clothing Manipulation Details (indicate cue type and reason): Max A for peri care Functional mobility during ADLs: Minimal assistance,Moderate assistance,+2 for physical  assistance,+2 for safety/equipment,Rolling Stegma[place] General ADL Comments: Pt presenting with decreased cognition, sequencing, balance, and strength.    Mobility  Overal bed mobility: Needs Assistance Bed Mobility: Supine to Sit Supine to sit: Max assist General bed mobility comments: Decreased initiation; assist for BLE's and trunk off edge of bed. Pt resistive    Transfers  Overall transfer level: Needs assistance Equipment used: Rolling [place] (2 wheeled) Transfers: Sit to/from Stand Sit to Stand: Min assist,Mod assist Stand pivot transfers: Min assist,+2 physical assistance General transfer comment: When pt initiating, requiring minA to rise to stand from edge of bed with increased time to rise. On subsequent stand from recliner, pt requiring modA and use of bed pad to scoot hips forward to edge of bed as she could not sequence to do so    Ambulation / Gait / Stairs / Wheelchair Mobility  Ambulation/Gait Ambulation/Gait assistance: +2 safety/equipment,Min assist Gait Distance (Feet): 60 Feet Assistive device: Rolling Pir[place] (2 wheeled) Gait Pattern/deviations: Step-through pattern,Decreased stride length,Drifts right/left General Gait Details: Pt with consistent left drift despite cues, minA for balance and [place] negotiation, close chair follow utilized. Requiring one seated rest break due to back spasm Gait velocity: decreased Gait velocity interpretation: <1.31 ft/sec, indicative of household ambulator    Posture / Balance Dynamic Sitting Balance Sitting balance - Comments: Posterior support due to pain Balance Overall balance assessment: Needs assistance Sitting-balance support: Feet supported,Bilateral upper extremity supported Sitting balance-Leahy Scale: Poor Sitting balance - Comments: Posterior support due to pain Postural control: Posterior lean Standing balance support: During functional activity,Bilateral upper extremity supported Standing balance-Leahy Scale:  Poor Standing balance comment: Reliant on UE support on RW and minA.    Special needs/care consideration Skin Scalp staples and Special service needs On nimotop to complete a 21 day order d/t potential vasospasms     Previous Home Environment (from acute therapy documentation) Living Arrangements:  Spouse/significant other (girls 18 and 21)  Lives With: Spouse Available Help at Discharge: Family,Available PRN/intermittently Type of Home: House Home Layout: Two level,Able to live on main level with bedroom/bathroom Alternate Level Stairs-Rails: Left,Right Home Access: Level entry Bathroom Shower/Tub: Chiropodist: Standard Home Care Services: No Additional Comments: 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, museum for [employer]  Discharge Living Setting Plans for Discharge Living Setting: Patient's home Type of Home at Discharge: House Discharge Home Layout: Two level Alternate Level Stairs-Rails: Right,Left Alternate Level Stairs-Number of Steps: 14 Discharge Home Access: Level entry Discharge Bathroom Shower/Tub: Tub/shower unit,Walk-in shower Discharge Bathroom Toilet: Standard Discharge Bathroom Accessibility: Yes How Accessible: Accessible via [place] Does the patient have any problems obtaining your medications?: No  Social/Family/Support Systems Patient Roles: Spouse Contact Information: [phone] Anticipated Caregiver: Kendra Opitz Anticipated Caregiver's Contact Information: [phone] Ability/Limitations of Caregiver: Can provide Min A Caregiver Availability: 24/7 Discharge Plan Discussed with Primary Caregiver: Yes Is Caregiver In Agreement with Plan?: Yes Does Caregiver/Family have Issues with Lodging/Transportation while Pt is in Rehab?: No   Goals Patient/Family Goal for Rehab: Min A to supervision with PT?OT; Supervision to Mod I with SLP Expected length of stay: 14-18 days Pt/Family Agrees to Admission and willing to participate: Yes Program  Orientation Provided & Reviewed with Pt/Caregiver Including Roles  & Responsibilities: Yes   Decrease burden of Care through IP rehab admission: Specialzed equipment needs, Decrease number of caregivers, Bowel and bladder program and Patient/family education   Possible need for SNF placement upon discharge: not anticipated   Patient Condition: This patient's medical and functional status has changed since the consult dated: 06/30/20 in which the Rehabilitation Physician determined and documented that the patient's condition is appropriate for intensive rehabilitative care in an inpatient rehabilitation facility. See "History of Present Illness" (above) for medical update. Functional changes are: Currently up in chair at the bedside and requiring mod to max assist for mobility and ADLs. Patient's medical and functional status update has been discussed with the Rehabilitation physician and patient remains appropriate for inpatient rehabilitation. Will admit to inpatient rehab today.  Preadmission Screen Completed By:  [name], RN, 07/09/2020 12:37 PM ______________________________________________________________________   Discussed status with Dr. Dagoberto Ligas and Dr. Naaman Plummer on 07/09/20 at 1000 and received approval for admission today.  Admission Coordinator:  [name], time 1310/Date 07/09/20           Cosigned by: Courtney Heys, MD at 07/09/2020 1:24 PM    Revision History                                            Note Details  Author [name], RN File Time 07/09/2020 1:11 PM  Author Type Rehab Admission Coordinator Status Signed  Last Editor [name], RN Service Physical Medicine and [place] # [id number] Admit Date 07/09/2020

## 2020-07-09 NOTE — Progress Notes (Signed)
Patient's family reports a stuffed sloth is missing from her room. I have left a message with laundry requesting they look and call the unit back.

## 2020-07-09 NOTE — Progress Notes (Signed)
Patient transferred to [phone] with all belongings. Contacted daughter, Debe Coder, with new room number.

## 2020-07-09 NOTE — Progress Notes (Signed)
Inpatient Rehabilitation  Patient information reviewed and entered into eRehab system by Rebekah Zackery M. Melisssa Donner, M.A., CCC/SLP, PPS Coordinator.  Information including medical coding, functional ability and quality indicators will be reviewed and updated through discharge.    

## 2020-07-09 NOTE — Progress Notes (Addendum)
Inpatient Rehabilitation Medication Review by a Pharmacist  A complete drug regimen review was completed for this patient to identify any potential clinically significant medication issues.  Clinically significant medication issues were identified:  Yes  Type of Medication Issue Identified Description of Issue Urgent (address now) Non-Urgent (address on AM team rounds) Plan Plan Accepted by Provider? (Yes / No / Pending AM Rounds)  Drug Interaction(s) (clinically significant)       Duplicate Therapy       Allergy       No Medication Administration End Date       Incorrect Dose       Additional Drug Therapy Needed       Other  Amlodipine was listed on pt's [place] discharge med list; CIR provider note indicated plan to start amlodipine (to replace furosemide) at CIR; amlodipine not ordered yet  The following meds were listed on pt's [place] discharge list (pt was not on these meds at [facility] [place]): MVI Turmeric Urgent         Non urgent Contact Reesa Chew, PA Pending review by CIR provider    Name of provider notified for urgent issues identified:  Reesa Chew, PA  Provider Method of Notification:  Secure chat  For non-urgent medication issues to be resolved on team rounds tomorrow morning a [facility] Secure Chat Handoff was sent to:  Reesa Chew, PA  Time spent performing this drug regimen review (minutes):  25  Gillermina Hu, PharmD, BCPS, [facility] Clinical Pharmacist 07/09/2020 4:13 PM

## 2020-07-09 NOTE — Discharge Summary (Signed)
Physician Discharge Summary  Patient ID: DELONDA COLEY MRN: [id number] DOB/AGE: June 28, 1970 50 y.o.  Admit date: 06/29/2020 Discharge date: 07/09/2020  Admission Diagnoses:  SAH hydrocephlaus hyponatremia Mild cerebral vasospasm  Discharge Diagnoses:  Same Active Problems:   Subarachnoid hemorrhage [facility])  Discharged Condition: Stable  Hospital Course:  Amy Barry is a 50 y.o. female who presented to the [facility] 06/29/2020 with acute onset worst headache of life.  She underwent CT scan of her head and ultimately a CTA which revealed subarachnoid hemorrhage without known etiology.  She underwent diagnostic cerebral angiogram which did not note the presence of any arterial malformation including aneurysm.  She was admitted to the neuro ICU for further workup and monitoring.  She was noted to get progressively more lethargic.  An EVD was placed at the bedside with improvement in her neuro status.   She was monitored in the ICU and underwent serial transcranial Dopplers for monitoring of vasospasm.  Multi CDs did show a mild vasospasm, she remained relatively asymptomatic.  She was kept on him on top for 21 days for vasospasm prevention.  And will eed to  Complete the 21 day course due to mild vasospasm noted.  She underwent a follow-up diagnostic cerebral angiogram 1 week after the initial which also did not note the presence of any abnormal blood vessel malformation or aneurysm.  Her EVD was clamped for 48 hr.  She underwent 2 CT scans of her head.  Both did not show any development of hydrocephalus.  The EVD was therefore removed.  Patient was therefore ready for discharge.  She worked with occupational and physical therapy.  C-arm was recommended for rehab.  She was discharged on 07/09/2020 in hemodynamically stable condition.  She did go to have mild hyponatremia.  She will load toe take salt tabs.  This will be monitored on the rehab side.  Treatments: Surgery Diagnostic angiogram  x2  Discharge Exam: Blood pressure 133/82, pulse 96, temperature 99.3 F (37.4 C), temperature source Oral, resp. rate 19, height 5\' 4"  (1.626 m), weight 85.7 kg, last menstrual period 12/22/2017, SpO2 99 %. Awake, alert, oriented Speech fluent, appropriate CN grossly intact MAEW Wound c/d/i  Disposition: Discharge disposition: [place] Not Defined       Discharge Instructions    Call MD for:  difficulty breathing, headache or visual disturbances   Complete by: As directed    Call MD for:  persistant dizziness or light-headedness   Complete by: As directed    Call MD for:  redness, tenderness, or signs of infection (pain, swelling, redness, odor or green/yellow discharge around incision site)   Complete by: As directed    Call MD for:  severe uncontrolled pain   Complete by: As directed    Call MD for:  temperature >100.4   Complete by: As directed    Increase activity slowly   Complete by: As directed    No wound care   Complete by: As directed      Allergies as of 07/09/2020      Reactions   Imitrex [sumatriptan] Other (See Comments)   High blood pressure   Demerol [meperidine] Nausea And Vomiting   Oxycodone Other (See Comments)   DROWSY   Reglan [metoclopramide] Other (See Comments)   "shakiness"   Tramadol Nausea Only   Codeine Palpitations   Propoxyphene N-acetaminophen Nausea And Vomiting      Medication List    STOP taking these medications   ibuprofen 200 MG  tablet Commonly known as: ADVIL     TAKE these medications   amLODipine 5 MG tablet Commonly known as: NORVASC Take 5 mg by mouth daily.   docusate sodium 50 MG capsule Commonly known as: COLACE Take 50 mg by mouth daily as needed (for constipation.).   folic acid 1 MG tablet Commonly known as: FOLVITE Take 1 mg by mouth daily.   furosemide 40 MG tablet Commonly known as: LASIX Take 1 tablet (40 mg total) by mouth daily.   gabapentin 300 MG capsule Commonly  known as: NEURONTIN Take 300 mg by mouth 3 (three) times daily as needed (pain).   hydroxychloroquine 200 MG tablet Commonly known as: PLAQUENIL Take 200 mg by mouth 2 (two) times daily.   methotrexate 2.5 MG tablet Take 5 mg by mouth once a week. Friday   metoprolol tartrate 100 MG tablet Commonly known as: LOPRESSOR TAKE ONE TABLET BY MOUTH TWICE DAILY   multivitamin with minerals Tabs tablet Take 1 tablet by mouth daily.   niMODipine 6 MG/ML Soln Commonly known as: NYMALIZE Take 10 mLs (60 mg total) by mouth every 4 (four) hours for 11 days.   omeprazole 40 MG capsule Commonly known as: PRILOSEC Take 40 mg by mouth daily as needed (acid reflux).   ondansetron 4 MG tablet Commonly known as: ZOFRAN Take 1 tablet (4 mg total) by mouth every 6 (six) hours as needed for nausea.   pantoprazole 40 MG tablet Commonly known as: PROTONIX Take 40 mg by mouth daily as needed (for acid reflux/indigestion).   potassium chloride 10 MEQ tablet Commonly known as: KLOR-CON TAKE ONE TABLET BY MOUTH ONCE DAILY   TURMERIC PO Take 1 capsule by mouth daily.       Follow-up Information    Consuella Lose, MD. Schedule an appointment as soon as possible for a visit in 3 week(s).   Specialty: Neurosurgery Contact information: [address] [place] [address] [phone]               Signed: Traci Sermon 07/09/2020, 1:19 PM

## 2020-07-09 NOTE — Progress Notes (Signed)
Patient ID: Amy Barry, female   DOB: Feb 14, 1971, 50 y.o.   MRN: [id number]  Met with patient upon arrival to unit and explain my role in her care. Answered all questions patient had and nursing explained what to expect during her rehab stay. I will continue to monitor her progress while on rehab.  Dorthula Nettles, RN3, BSN, CBIS, [place], [facility], Inpatient Rehabilitation Office [phone] Cell [phone]

## 2020-07-09 NOTE — H&P (Shared)
Physical Medicine and Rehabilitation Admission H&P    Chief Complaint  Patient presents with  .  Functional deficits due to SAH/hydrocephalus.    HPI: Amy Barry is a 50 year old female with history of HTN, IBS, anemia, connective tissue disorder-on MTX/Plaquenil, family history of cerebral aneurysms who was admitted on 06/29/2020 after found down by daughter with slurred speech and confusion.  She was found to have [place] and posterior fossa felt to be aneurysmal in nature.  CTA head/neck did not show a definite source of bleed but had poor visualization of proximal left-ICA with tiny bulbous area and question of rupture or dissection.  She was evaluated by Dr. Kathyrn Sheriff and underwent cerebral angio with placement of right frontal ventricular drain and was negative for AVM, aneurysm, fistula or dissection.  Hospital course significant for issues with headaches with neck stiffness and upper back spasms, development small hematoma along course of drain in right frontal lobe as well as hypoglycemia.    SAH and IVH have resolved and intraventricular drain removed on 04/06 as follow-up CT without hydrocephalus.  Repeat cerebral angiogram on 04/04 was negative for aneurysm, AVM or fistula.  She has had issues with progressive hyponatremia with drop in sodium to 127 and transient hypokalemia improved with supplementation.  Lethargy is resolving with improvement in mentation and participation, cognitive deficits with delay in processing, needs multimodal cues to follow one-step commands, balance deficits with tendency to drift to the left.  Patient with deficits in mobility and ability to carry out ADLs.  CIR was recommended due to functional decline.   Review of Systems  Constitutional: Negative for chills and fever.  HENT: Negative for hearing loss and tinnitus.   Eyes: Negative for blurred vision and double vision.  Cardiovascular: Negative for chest pain and palpitations.  Gastrointestinal:  Negative for heartburn and nausea.  Genitourinary: Negative for dysuria and urgency.       Now having problems with bowel incontinence.   Musculoskeletal: Positive for back pain, myalgias and neck pain.       Constant hip pain  Skin: Negative for itching and rash.  Neurological: Positive for weakness and headaches. Negative for dizziness.       1-2 X a month gets numbness left hemibody.  Light sensitivity/HA now.   Psychiatric/Behavioral: The patient is nervous/anxious.       Past Medical History:  Diagnosis Date  . Arthralgia   . Generalized weakness    bilateral upper and lower extremities,  walks with cane  . GERD (gastroesophageal reflux disease)   . Headache(784.0)   . Heart murmur    per pt "since childhood, no problems"  . History of kidney stones   . Hypertension    followed pcp  . IBS (irritable bowel syndrome)   . Iron deficiency anemia    12-06-2017 per pt recently had IV iron infusion July 2019  . Low vitamin D level   . Osteoarthritis   . PVC (premature ventricular contraction)    per pt had holter monitor  . SLE (systemic lupus erythematosus) ([place])    rheumotologist-- dr syed  . Wears glasses     Past Surgical History:  Procedure Laterality Date  . CERVICAL DISC ARTHROPLASTY N/A 10/23/2015   Procedure: Cervical Disc Arthroplasty Cervical six-seven;  Surgeon: Eustace Moore, MD;  Location: [facility];  Service: Neurosurgery;  Laterality: N/A;  . D & C HYSTEROSCOPY W/ RESECTION ENDOMETRIAL POLYP  10-13-2003    dr rivard @[facility]   . ESOPHAGOGASTRODUODENOSCOPY  for GERD  . IR ANGIO INTRA EXTRACRAN SEL COM CAROTID INNOMINATE BILAT MOD SED  07/06/2020  . IR ANGIO INTRA EXTRACRAN SEL INTERNAL CAROTID BILAT MOD SED  06/29/2020  . IR ANGIO VERTEBRAL SEL VERTEBRAL BILAT MOD SED  06/29/2020  . LAPAROSCOPIC ABDOMINAL EXPLORATION  1996   dx ibs  . OVARIAN CYST SURGERY  age 41   x2 cyst  . RADIOLOGY WITH ANESTHESIA N/A 06/29/2020   Procedure: IR WITH ANESTHESIA;  Surgeon:  Radiologist, Medication, MD;  Location: [facility];  Service: Radiology;  Laterality: N/A;  . ROBOTIC ASSISTED TOTAL HYSTERECTOMY N/A 12/13/2017   Procedure: XI ROBOTIC ASSISTED TOTAL HYSTERECTOMYWITH BILATERAL SALPINGECTOMY;  Surgeon: Christophe Louis, MD;  Location: [facility];  Service: Gynecology;  Laterality: N/A;  . TUBAL LIGATION Bilateral 06-01-2002   dr Mancel Bale @[facility]    PPTL   Family History  Problem Relation Age of Onset  . Arthritis Other   . Hyperlipidemia Other   . Hypertension Daughter   . Colon polyps Daughter 9       hermatoma oversized polyps-benign  . Stroke Other   . Leukemia Maternal Grandmother   . Heart disease Maternal Grandmother   . Asthma Sister   . Hypertension Mother        hx aneursym and surgery  . Atrial fibrillation Mother   . Sarcoidosis Mother   . Healthy Father   . Colon cancer Neg Hx     Social History:  Works as a Marine scientist for [employer].  reports that she has never smoked. She has never used smokeless tobacco. She reports that she does not drink alcohol and does not use drugs.    Allergies  Allergen Reactions  . Imitrex [Sumatriptan] Other (See Comments)    High blood pressure  . Demerol [Meperidine] Nausea And Vomiting  . Oxycodone Other (See Comments)    DROWSY  . Reglan [Metoclopramide] Other (See Comments)    "shakiness"  . Tramadol Nausea Only  . Codeine Palpitations  . Propoxyphene N-Acetaminophen Nausea And Vomiting    Medications Prior to Admission  Medication Sig Dispense Refill  . amLODipine (NORVASC) 5 MG tablet Take 5 mg by mouth daily.    Marland Kitchen docusate sodium (COLACE) 50 MG capsule Take 50 mg by mouth daily as needed (for constipation.).    Marland Kitchen folic acid (FOLVITE) 1 MG tablet Take 1 mg by mouth daily.    . furosemide (LASIX) 40 MG tablet Take 1 tablet (40 mg total) by mouth daily. 90 tablet 3  . gabapentin (NEURONTIN) 300 MG capsule Take 300 mg by mouth 3 (three) times daily as needed (pain).    . hydroxychloroquine (PLAQUENIL) 200 MG tablet Take 200  mg by mouth 2 (two) times daily.   1  . ibuprofen (ADVIL,MOTRIN) 200 MG tablet Take 600-800 mg by mouth every 6 (six) hours as needed (for pain/fever).    . methotrexate 2.5 MG tablet Take 5 mg by mouth once a week. Friday    . metoprolol (LOPRESSOR) 100 MG tablet TAKE ONE TABLET BY MOUTH TWICE DAILY (Patient taking differently: Take 100 mg by mouth 2 (two) times daily.) 180 tablet 3  . Multiple Vitamin (MULTIVITAMIN WITH MINERALS) TABS Take 1 tablet by mouth daily.     Marland Kitchen omeprazole (PRILOSEC) 40 MG capsule Take 40 mg by mouth daily as needed (acid reflux).    . ondansetron (ZOFRAN) 4 MG tablet Take 1 tablet (4 mg total) by mouth every 6 (six) hours as needed for nausea. 20 tablet 0  .  pantoprazole (PROTONIX) 40 MG tablet Take 40 mg by mouth daily as needed (for acid reflux/indigestion).     . potassium chloride (K-DUR) 10 MEQ tablet TAKE ONE TABLET BY MOUTH ONCE DAILY (Patient taking differently: Take 10 mEq by mouth daily.) 90 tablet 3  . TURMERIC PO Take 1 capsule by mouth daily.      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 Had someone put on socks over weekend and will ask for help from family if having a "bad day"  Functional Status:  Mobility: Bed  Mobility Overal bed mobility: Needs Assistance Bed Mobility: Sidelying to Sit Sidelying to sit: Mod assist,+2 for safety/equipment Supine to sit: Max assist General bed mobility comments: Cues to bring BLEs over EOB. Mod A to elevate trunk Transfers Overall transfer level: Needs assistance Equipment used: Rolling [place] (2 wheeled) Transfers: Sit to/from Stand Sit to Stand: Min assist Stand pivot transfers: Min assist,+2 physical assistance General transfer comment: Min A for power up and gaining balance Ambulation/Gait Ambulation/Gait assistance: +2 safety/equipment,Min assist Gait Distance (Feet): 60 Feet Assistive device: Rolling Bartoszek (2 wheeled) Gait Pattern/deviations: Step-through pattern,Decreased stride length,Drifts right/left General Gait Details: Pt with consistent left drift despite cues, minA for balance and Fayad negotiation, close chair follow utilized. Requiring one seated rest break due to back spasm Gait velocity: decreased Gait velocity interpretation: <1.31 ft/sec, indicative of household ambulator    ADL: ADL Overall ADL's : Needs assistance/impaired Eating/Feeding: Minimal assistance,Sitting Grooming: Oral care,Sitting,Set up,Supervision/safety,Cueing for sequencing Grooming Details (indicate cue type and reason): Asking pt to walk up to next hand sanitizer despinser and get sanitizer. Pt walking past the first one and walking to the second one on left; feel pt with poor attention and visual gaze to left. Pt walking straight up to despinser and putting her head against it. When asked why she stating "I didnt want it to go anywhere." Upper Body Bathing: Moderate assistance Lower Body Bathing: Maximal assistance Lower Body Dressing: Moderate assistance,Sit to/from stand Lower Body Dressing Details (indicate cue type and reason): Mod A for initating donning socks and then support to bring BLEs towards EOB Toilet Transfer: Minimal assistance,+2 for  safety/equipment,+2 for physical assistance,RW Toilet Transfer Details (indicate cue type and reason): simulated to recliner Toileting- Clothing Manipulation and Hygiene: Maximal assistance,Sit to/from stand Toileting - Clothing Manipulation Details (indicate cue type and reason): Max A for peri care Functional mobility during ADLs: Minimal assistance,Rolling [place],+2 for safety/equipment,Cueing for sequencing,+2 for physical assistance General ADL Comments: Contineus to present with decreased cognition, balance, and strength imapcting her safety and ufncitonal performance  Cognition: Cognition Overall Cognitive Status: Impaired/Different from baseline Arousal/Alertness: Lethargic Orientation Level: Oriented X4 Attention: Sustained Sustained Attention: Impaired Sustained Attention Impairment: Verbal basic Memory:  (will assess further) Awareness: Impaired Awareness Impairment: Anticipatory impairment,Emergent impairment,Intellectual impairment Problem Solving:  (will assess when alertness improves) Safety/Judgment: Impaired Cognition Arousal/Alertness: Awake/alert Behavior During Therapy: WFL for tasks assessed/performed Overall  Cognitive Status: Impaired/Different from baseline Area of Impairment: Problem solving,Safety/judgement,Memory,Following commands,Attention Orientation Level:  (NT) Current Attention Level: Sustained Memory: Decreased short-term memory Following Commands: Follows one step commands consistently,Follows one step commands with increased time,Follows multi-step commands inconsistently Safety/Judgement: Decreased awareness of safety Awareness: Intellectual,Emergent Problem Solving: Slow processing,Requires verbal cues,Difficulty sequencing,Decreased initiation,Requires tactile cues General Comments: Significantly slowed processing, often requires multimodal cues and repetition to perform task. Pt with poor awareness of deficits and requiring both verbal and visual  cues to bring awareness. With questioning cues such as "which way are you lean" pt able to answer question but then unable to correct without instructional cues. Very poor spacial awareness  Physical Exam: Blood pressure 136/79, pulse (!) 52, temperature 99.3 F (37.4 C), temperature source Oral, resp. rate 20, height 5\' 4"  (1.626 m), weight 85.7 kg, last menstrual period 12/22/2017, SpO2 (!) 77 %. Physical Exam Vitals and nursing note reviewed.  Constitutional:      Appearance: Normal appearance.     Comments: Tends to keep neck flexed to left. Few scalp stapled right scalp with some bogginess. Restless with light sensitivity.   Pulmonary:     Effort: Pulmonary effort is normal.     Breath sounds: Normal breath sounds.  Neurological:     Mental Status: She is alert and oriented to person, place, and time.     Results for orders placed or performed during the hospital encounter of 06/29/20 (from the past 48 hour(s))  Glucose, capillary     Status: Abnormal   Collection Time: 07/07/20  5:04 PM  Result Value Ref Range   Glucose-Capillary 117 (H) 70 - 99 mg/dL    Comment: Glucose reference range applies only to samples taken after fasting for at least 8 hours.  Glucose, capillary     Status: Abnormal   Collection Time: 07/07/20 10:58 PM  Result Value Ref Range   Glucose-Capillary 120 (H) 70 - 99 mg/dL    Comment: Glucose reference range applies only to samples taken after fasting for at least 8 hours.  Glucose, capillary     Status: Abnormal   Collection Time: 07/08/20  7:33 AM  Result Value Ref Range   Glucose-Capillary 119 (H) 70 - 99 mg/dL    Comment: Glucose reference range applies only to samples taken after fasting for at least 8 hours.  Glucose, capillary     Status: None   Collection Time: 07/08/20 11:55 AM  Result Value Ref Range   Glucose-Capillary 99 70 - 99 mg/dL    Comment: Glucose reference range applies only to samples taken after fasting for at least 8 hours.   Glucose, capillary     Status: Abnormal   Collection Time: 07/08/20  4:36 PM  Result Value Ref Range   Glucose-Capillary 108 (H) 70 - 99 mg/dL    Comment: Glucose reference range applies only to samples taken after fasting for at least 8 hours.  Glucose, capillary     Status: Abnormal   Collection Time: 07/08/20  9:06 PM  Result Value Ref Range   Glucose-Capillary 118 (H) 70 - 99 mg/dL    Comment: Glucose reference range applies only to samples taken after fasting for at least 8 hours.  Glucose, capillary     Status: Abnormal   Collection Time: 07/09/20  6:54 AM  Result Value Ref Range   Glucose-Capillary 101 (H) 70 - 99 mg/dL    Comment: Glucose reference range applies only to samples taken after fasting for at least 8 hours.  Glucose, capillary     Status: Abnormal   Collection Time: 07/09/20 11:52 AM  Result Value Ref Range   Glucose-Capillary 117 (H) 70 - 99 mg/dL    Comment: Glucose reference range applies only to samples taken after fasting for at least 8 hours.   CT HEAD WO CONTRAST  Result Date: 07/08/2020 CLINICAL DATA:  Headache with right frontal hemorrhage noted 1 day prior EXAM: CT HEAD WITHOUT CONTRAST TECHNIQUE: Contiguous axial images were obtained from the base of the skull through the vertex without intravenous contrast. COMPARISON:  July 07, 2020 and June 29, 2020 FINDINGS: Brain: There is a shunt catheter from a right frontal approach with tip in third ventricle, unchanged. There is hemorrhage with surrounding edema in the right frontal lobe region, unchanged from 1 day prior. The focus of hemorrhage measures 2.1 x 2.0 cm, stable. No new area of hemorrhage. Currently there is no appreciable ventricular dilatation. Sulci appear unremarkable. No appreciable mass. No subdural or epidural fluid collections. No midline shift. Note that previously noted subarachnoid hemorrhage seen on the March 2022 study is no longer evident with brain parenchyma appearance unchanged from 1  day prior. No findings suggesting acute infarct. Vascular: No hyperdense vessel. Slight calcification noted in the carotid siphon regions. Skull: The bony calvarium appears intact except for bony defect for shunt placement in the right frontal region. Sinuses/Orbits: Paranasal sinuses are clear. Orbits appear symmetric bilaterally. Other: Mastoid air cells clear. IMPRESSION: Shunt catheter tip in third ventricle without hydrocephalus. Ventricles appear unchanged compared to 1 day prior. No sulcal enlargement. Hemorrhage in the right frontal lobe surrounding a portion of the catheter is stable. No new hemorrhage evident. Interval resolution of subarachnoid hemorrhage. Gray-white differentiation unremarkable. No acute infarct evident. No extra-axial fluid collection or midline shift. Slight arterial vascular calcification noted. Electronically Signed   By: Lowella Grip III M.D.   On: 07/08/2020 08:41       Medical Problem List and Plan: 1.  *** secondary to ***  -patient may *** shower  -ELOS/Goals: *** 2.  Antithrombotics: -DVT/anticoagulation:  Mechanical: Sequential compression devices, below knee Bilateral lower extremities  -antiplatelet therapy: N/A 3. Pain Management: Was using NSAIDS tid at home  --will continue Oxycodone as needed 4. Mood: LCSW to follow for evaluation and support.    -antipsychotic agents: N/A 5. Neuropsych: This patient is not fully capable of making decisions on her own behalf. 6. Skin/Wound Care: Routine pressure-relief measures.  7. Fluids/Electrolytes/Nutrition: Monitor I/O. Check lytes in am.  8. Hyponatremia:  Sodium with downward trend--127 today.  --Start 1500 cc FR with strict I/O.  --Check CMET in am.  9.  Seizure prophylaxis: Continue Keppra twice daily.   10. HTN/Tachycardia: Monitor blood pressures 3 times daily.  --Continue Nimotop every 4 hours to prevent vasospasms.  --Continue metoprolol.  Resume amlodipine to replace lasix.    --Will DC  Lasix due to hyponatremia and titrate medication as indicated. 11.  Recurrent hypokalemia: Will Kdur for supplementation daily. 12.  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 (Had intermittent weakness with RLE with foot drop progressing to left sided weakness--needed therapy X 2 years for recovery)  --Was weaning off methotrexate--will d/c per patient request.  --was off plaquenil due to diarrhea. Will taper to daily X 7 to d/c.   --Resume gabapentin/K pad to help with myalgias.  13.  Stress related hyperglycemia: Hgb A1c-5.4.  Continue to monitor blood sugars AC/at bedtime.  --Use sliding scale insulin for elevated blood sugars. 14.  Hypocalcemia: Add calcium for oral supplementation 15. Bowel incontinence: Discontinue colace. Monitor as plaquenil weans off.   16. Fevers: Low grade fevers in the past 12 hours. Monitor--will order dopplers and CBC with diff for now. Sepsis protocol prn T>100.5.    ***  Bary Leriche, PA-C 07/09/2020

## 2020-07-09 NOTE — Progress Notes (Signed)
Physical Therapy Treatment Patient Details Name: Amy Barry MRN: [id number] DOB: 01/25/1971 Today's Date: 07/09/2020    History of Present Illness 50 y.o. F admitted 3/28 with AMS and incomprehensible speech. 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 EVd removed 4/6. Significant PMH: HTN, lupus, heart murmur, cervical fusion.    PT Comments    Pt demonstrating good progress with gait, ambulating an increased distance of up to ~90 ft with a RW and minA for stability with chair follow and line management. Pt needing mod cues though due to poor deficits and body awareness as pt tends to drift to the L, ambulate with a narrow BOS, and bump into objects. Pt is at high risk for falls. Will continue to follow acutely. Current recommendations remain appropriate.    Follow Up Recommendations  CIR     Equipment Recommendations  3in1 (PT)    Recommendations for Other Services       Precautions / Restrictions Precautions Precautions: Fall;Other (comment) Precaution Comments: SBP < 140 Restrictions Weight Bearing Restrictions: No    Mobility  Bed Mobility Overal bed mobility: Needs Assistance Bed Mobility: Sidelying to Sit   Sidelying to sit: Mod assist;+2 for safety/equipment       General bed mobility comments: Cues to bring BLEs over EOB. Mod A to elevate trunk    Transfers Overall transfer level: Needs assistance Equipment used: Rolling [place]pp (2 wheeled) Transfers: Sit to/from Stand Sit to Stand: Min assist         General transfer comment: Min A for power up and gaining balance  Ambulation/Gait Ambulation/Gait assistance: +2 safety/equipment;Min assist (+mod cues) Gait Distance (Feet): 90 Feet Assistive device: Rolling [place] (2 wheeled) Gait Pattern/deviations: Step-through pattern;Decreased stride length;Drifts right/left;Decreased step length -  right;Decreased step length - left;Narrow base of support (drifts L) Gait velocity: decreased Gait velocity interpretation: <1.31 ft/sec, indicative of household ambulator General Gait Details: Pt with poor management of RW, stepping into it often even though cued to continue pushing it rather than stopping the RW every 1-2 steps. Pt with poor spatial awareness, drifting to L often, providing cues to detect her body position in relation to objects, RW, or wall to avoid bumping obstacles, poor carryover. Verbal and visual cues provided to widen stance, but pt reverts after ~10 steps. MinA for stability, chair follow for safety.   Stairs             Wheelchair Mobility    Modified Rankin (Stroke Patients Only) Modified Rankin (Stroke Patients Only) Pre-Morbid Rankin Score: No symptoms Modified Rankin: Moderately severe disability     Balance Overall balance assessment: Needs assistance Sitting-balance support: Feet supported;Bilateral upper extremity supported Sitting balance-Leahy Scale: Poor Sitting balance - Comments: Posterior support due to pain Postural control: Posterior lean Standing balance support: During functional activity;Bilateral upper extremity supported Standing balance-Leahy Scale: Poor Standing balance comment: Reliant on UE support on RW and minA.                            Cognition Arousal/Alertness: Awake/alert Behavior During Therapy: WFL for tasks assessed/performed Overall Cognitive Status: Impaired/Different from baseline Area of Impairment: Problem solving;Safety/judgement;Memory;Following commands;Attention                 Orientation Level:  (NT) Current Attention Level: Sustained Memory: Decreased short-term memory Following Commands: Follows one step commands consistently;Follows one step commands with  increased time;Follows multi-step commands inconsistently Safety/Judgement: Decreased awareness of safety Awareness:  Intellectual;Emergent Problem Solving: Slow processing;Requires verbal cues;Difficulty sequencing;Decreased initiation;Requires tactile cues General Comments: Significantly slowed processing, often requires multimodal cues and repetition to perform task. Pt with poor awareness of deficits and requiring both verbal and visual cues to bring awareness. With questioning cues such as "which way are you lean" pt able to answer question but then unable to correct without instructional cues. Very poor spacial awareness      Exercises      General Comments General comments (skin integrity, edema, etc.): BP 144/96 start of session, 112/85 end of session; pt reporting she flet like she was leaning to L in chair but in reality she was leaning R      Pertinent Vitals/Pain Pain Assessment: Faces Faces Pain Scale: Hurts even more Pain Location: Head, back Pain Descriptors / Indicators: Headache;Throbbing;Sharp;Aching;Spasm Pain Intervention(s): Limited activity within patient's tolerance;Monitored during session;Repositioned    Home Living                      Prior Function            PT Goals (current goals can now be found in the care plan section) Acute Rehab PT Goals Patient Stated Goal: less pain PT Goal Formulation: With patient/family Time For Goal Achievement: 07/14/20 Potential to Achieve Goals: Good Progress towards PT goals: Progressing toward goals    Frequency    Min 4X/week      PT Plan Current plan remains appropriate    Co-evaluation PT/OT/SLP Co-Evaluation/Treatment: Yes Reason for Co-Treatment: Necessary to address cognition/behavior during functional activity;For patient/therapist safety;To address functional/ADL transfers PT goals addressed during session: Mobility/safety with mobility;Balance;Proper use of DME OT goals addressed during session: ADL's and self-care      AM-PAC PT "6 Clicks" Mobility   Outcome Measure  Help needed turning from your  back to your side while in a flat bed without using bedrails?: A Lot Help needed moving from lying on your back to sitting on the side of a flat bed without using bedrails?: A Lot Help needed moving to and from a bed to a chair (including a wheelchair)?: A Lot Help needed standing up from a chair using your arms (e.g., wheelchair or bedside chair)?: A Little Help needed to walk in hospital room?: A Lot Help needed climbing 3-5 steps with a railing? : Total 6 Click Score: 12    End of Session Equipment Utilized During Treatment: Gait belt Activity Tolerance: Patient tolerated treatment well Patient left: in chair;with call bell/phone within reach;with chair alarm set;with family/visitor present (with SLP entering room) Nurse Communication: Mobility status PT Visit Diagnosis: Pain;Difficulty in walking, not elsewhere classified (R26.2);Unsteadiness on feet (R26.81);Other abnormalities of gait and mobility (R26.89);Muscle weakness (generalized) (M62.81) Pain - part of body:  (head)     Time: 1107-1130 PT Time Calculation (min) (ACUTE ONLY): 23 min  Charges:  $Gait Training: 8-22 mins                     Moishe Spice, PT, DPT Acute Rehabilitation Services  Pager: [phone] Office: [place] 07/09/2020, 2:56 PM

## 2020-07-09 NOTE — Progress Notes (Addendum)
Occupational Therapy Treatment Patient Details Name: Amy Barry START MRN: [id number] DOB: March 22, 1971 Today's Date: 07/09/2020    History of present illness 50 y.o. F admitted 3/28 with AMS and incomprehensible speech. 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 EVd removed 4/6. Significant PMH: HTN, lupus, heart murmur, cervical fusion.   OT comments  Pt continues to present with decreased cognition, balance, and strength impacting her safe performance of BADLs. Pt requiring Min A +2 for functional mobility with RW. Pt running into objects both on left and right with poor awareness of body and space. Asking pt to navigate to closest hand sanitizer dispenser in hallway (on left) to perform hand hygiene, pt skipping the first dispenser on left and then positioned her body so close that her head and chest could touch dispenser. Poor problem solving on how to use dispenser and pt pulling off part of the dispenser; Max cues to problem solve process. Pt continues to be internally distracted by pain and consistently moaning throughout session. Continue to highly recommend dc to CIR and will continue to follow acutely as admitted.    Follow Up Recommendations  CIR    Equipment Recommendations  3 in 1 bedside commode    Recommendations for Other Services Rehab consult    Precautions / Restrictions Precautions Precautions: Fall;Other (comment) Precaution Comments: SBP < 140       Mobility Bed Mobility Overal bed mobility: Needs Assistance Bed Mobility: Sidelying to Sit   Sidelying to sit: Mod assist;+2 for safety/equipment       General bed mobility comments: Cues to bring BLEs over EOB. Mod A to elevate trunk    Transfers Overall transfer level: Needs assistance Equipment used: Rolling [place] (2 wheeled) Transfers: Sit to/from Stand Sit to Stand: Min assist          General transfer comment: Min A for power up and gaining balance    Balance Overall balance assessment: Needs assistance Sitting-balance support: Feet supported;Bilateral upper extremity supported Sitting balance-Leahy Scale: Poor Sitting balance - Comments: Posterior support due to pain Postural control: Posterior lean Standing balance support: During functional activity;Bilateral upper extremity supported Standing balance-Leahy Scale: Poor Standing balance comment: Reliant on UE support on RW and minA.                           ADL either performed or assessed with clinical judgement   ADL Overall ADL's : Needs assistance/impaired       Grooming Details (indicate cue type and reason): Asking pt to walk up to next hand sanitizer despinser and get sanitizer. Pt walking past the first one and walking to the second one on left; feel pt with poor attention and visual gaze to left. Pt walking straight up to despinser and putting her head against it. When asked why she stating "I didnt want it to go anywhere."                 Toilet Transfer: Minimal assistance;+2 for safety/equipment;+2 for physical assistance;RW Toilet Transfer Details (indicate cue type and reason): simulated to recliner         Functional mobility during ADLs: Minimal assistance;Rolling [place];+2 for safety/equipment;Cueing for sequencing;+2 for physical assistance General ADL Comments: Contineus to present with decreased cognition, balance, and strength imapcting her safety and ufncitonal performance     Vision       Perception  Praxis      Cognition Arousal/Alertness: Awake/alert Behavior During Therapy: WFL for tasks assessed/performed Overall Cognitive Status: Impaired/Different from baseline Area of Impairment: Problem solving;Safety/judgement;Memory;Following commands;Attention                 Orientation Level:  (NT) Current Attention Level: Sustained Memory:  Decreased short-term memory Following Commands: Follows one step commands consistently;Follows one step commands with increased time;Follows multi-step commands inconsistently Safety/Judgement: Decreased awareness of safety Awareness: Intellectual;Emergent Problem Solving: Slow processing;Requires verbal cues;Difficulty sequencing;Decreased initiation;Requires tactile cues General Comments: Significantly slowed processing, often requires multimodal cues and repetition to perform task. Pt with poor awareness of deficits and requiring both verbal and visual cues to bring awareness. With questioning cues such as "which way are you lean" pt able to answer question but then unable to correct without instructional cues. Very poor spacial awareness        Exercises     Shoulder Instructions       General Comments Daughter, Maddie, present during session    Pertinent Vitals/ Pain       Pain Assessment: Faces Faces Pain Scale: Hurts even more Pain Location: Head, back Pain Descriptors / Indicators: Headache;Throbbing;Sharp;Aching;Spasm Pain Intervention(s): Monitored during session;Limited activity within patient's tolerance;Repositioned  Home Living                                          Prior Functioning/Environment              Frequency  Min 2X/week        Progress Toward Goals  OT Goals(current goals can now be found in the care plan section)  Progress towards OT goals: Progressing toward goals  Acute Rehab OT Goals Patient Stated Goal: less pain OT Goal Formulation: With patient Time For Goal Achievement: 07/14/20 Potential to Achieve Goals: Good ADL Goals Pt Will Perform Grooming: with set-up;sitting Pt Will Perform Upper Body Bathing: with set-up;sitting Pt Will Perform Lower Body Bathing: with min assist;sit to/from stand Pt Will Transfer to Toilet: with min assist;ambulating;bedside commode Additional ADL Goal #1: Pt will complete bed  mobility supervision level as precursor to adls.  Plan Discharge plan remains appropriate    Co-evaluation    PT/OT/SLP Co-Evaluation/Treatment: Yes Reason for Co-Treatment: Necessary to address cognition/behavior during functional activity;For patient/therapist safety;To address functional/ADL transfers   OT goals addressed during session: ADL's and self-care      AM-PAC OT "6 Clicks" Daily Activity     Outcome Measure   Help from another person eating meals?: A Little Help from another person taking care of personal grooming?: A Little Help from another person toileting, which includes using toliet, bedpan, or urinal?: A Lot Help from another person bathing (including washing, rinsing, drying)?: A Lot Help from another person to put on and taking off regular upper body clothing?: A Lot Help from another person to put on and taking off regular lower body clothing?: A Little 6 Click Score: 15    End of Session Equipment Utilized During Treatment: Gait belt;Rolling [place]  OT Visit Diagnosis: Unsteadiness on feet (R26.81);Muscle weakness (generalized) (M62.81);Pain   Activity Tolerance Patient tolerated treatment well   Patient Left in chair;with call bell/phone within reach;with chair alarm set;with family/visitor present   Nurse Communication Mobility status;Precautions        Time: 2409-7353 OT Time Calculation (min): 28 min  Charges: OT General Charges $OT Visit: 1  Visit OT Treatments $Self Care/Home Management : 8-22 mins  [place], OTR/L Acute Rehab Pager: [phone] Office: [place] 07/09/2020, 1:50 PM

## 2020-07-10 ENCOUNTER — Inpatient Hospital Stay (HOSPITAL_COMMUNITY): Payer: No Typology Code available for payment source

## 2020-07-10 DIAGNOSIS — E871 Hypo-osmolality and hyponatremia: Secondary | ICD-10-CM

## 2020-07-10 DIAGNOSIS — E876 Hypokalemia: Secondary | ICD-10-CM

## 2020-07-10 DIAGNOSIS — R509 Fever, unspecified: Secondary | ICD-10-CM

## 2020-07-10 DIAGNOSIS — I10 Essential (primary) hypertension: Secondary | ICD-10-CM

## 2020-07-10 DIAGNOSIS — R195 Other fecal abnormalities: Secondary | ICD-10-CM

## 2020-07-10 DIAGNOSIS — I609 Nontraumatic subarachnoid hemorrhage, unspecified: Secondary | ICD-10-CM | POA: Diagnosis not present

## 2020-07-10 LAB — CBC WITH DIFFERENTIAL/PLATELET
Abs Immature Granulocytes: 0.03 10*3/uL (ref 0.00–0.07)
Basophils Absolute: 0 10*3/uL (ref 0.0–0.1)
Basophils Relative: 0 %
Eosinophils Absolute: 0 10*3/uL (ref 0.0–0.5)
Eosinophils Relative: 0 %
HCT: 33.7 % — ABNORMAL LOW (ref 36.0–46.0)
Hemoglobin: 12.1 g/dL (ref 12.0–15.0)
Immature Granulocytes: 0 %
Lymphocytes Relative: 13 %
Lymphs Abs: 1.3 10*3/uL (ref 0.7–4.0)
MCH: 33.1 pg (ref 26.0–34.0)
MCHC: 35.9 g/dL (ref 30.0–36.0)
MCV: 92.1 fL (ref 80.0–100.0)
Monocytes Absolute: 1.2 10*3/uL — ABNORMAL HIGH (ref 0.1–1.0)
Monocytes Relative: 12 %
Neutro Abs: 7.3 10*3/uL (ref 1.7–7.7)
Neutrophils Relative %: 75 %
Platelets: 316 10*3/uL (ref 150–400)
RBC: 3.66 MIL/uL — ABNORMAL LOW (ref 3.87–5.11)
RDW: 11.9 % (ref 11.5–15.5)
WBC: 9.9 10*3/uL (ref 4.0–10.5)
nRBC: 0 % (ref 0.0–0.2)

## 2020-07-10 LAB — COMPREHENSIVE METABOLIC PANEL
ALT: 23 U/L (ref 0–44)
AST: 21 U/L (ref 15–41)
Albumin: 3.1 g/dL — ABNORMAL LOW (ref 3.5–5.0)
Alkaline Phosphatase: 43 U/L (ref 38–126)
Anion gap: 10 (ref 5–15)
BUN: 8 mg/dL (ref 6–20)
CO2: 24 mmol/L (ref 22–32)
Calcium: 9 mg/dL (ref 8.9–10.3)
Chloride: 96 mmol/L — ABNORMAL LOW (ref 98–111)
Creatinine, Ser: 0.72 mg/dL (ref 0.44–1.00)
GFR, Estimated: >60 mL/min (ref >60)
Glucose, Bld: 102 mg/dL — ABNORMAL HIGH (ref 70–99)
Potassium: 2.6 mmol/L — CL (ref 3.5–5.1)
Sodium: 130 mmol/L — ABNORMAL LOW (ref 135–145)
Total Bilirubin: 0.7 mg/dL (ref 0.3–1.2)
Total Protein: 6.7 g/dL (ref 6.5–8.1)

## 2020-07-10 LAB — GLUCOSE, CAPILLARY
Glucose-Capillary: 102 mg/dL — ABNORMAL HIGH (ref 70–99)
Glucose-Capillary: 114 mg/dL — ABNORMAL HIGH (ref 70–99)

## 2020-07-10 LAB — OSMOLALITY: Osmolality: 274 mOsm/kg — ABNORMAL LOW (ref 275–295)

## 2020-07-10 LAB — POTASSIUM: Potassium: 3.2 mmol/L — ABNORMAL LOW (ref 3.5–5.1)

## 2020-07-10 MED ORDER — CALCIUM POLYCARBOPHIL 625 MG PO TABS
625.0000 mg | ORAL_TABLET | Freq: Every day | ORAL | Status: DC
  Administered 2020-07-10 – 2020-07-24 (×15): 625 mg via ORAL
  Filled 2020-07-10 (×14): qty 1

## 2020-07-10 MED ORDER — SODIUM CHLORIDE 0.9 % IV SOLN
INTRAVENOUS | Status: DC | PRN
  Administered 2020-07-10: 250 mL via INTRAVENOUS

## 2020-07-10 MED ORDER — POTASSIUM CHLORIDE CRYS ER 20 MEQ PO TBCR
40.0000 meq | EXTENDED_RELEASE_TABLET | Freq: Two times a day (BID) | ORAL | Status: DC
  Administered 2020-07-10 – 2020-07-12 (×5): 40 meq via ORAL
  Filled 2020-07-10 (×5): qty 2

## 2020-07-10 MED ORDER — SACCHAROMYCES BOULARDII 250 MG PO CAPS
250.0000 mg | ORAL_CAPSULE | Freq: Two times a day (BID) | ORAL | Status: DC
  Administered 2020-07-10 – 2020-07-24 (×29): 250 mg via ORAL
  Filled 2020-07-10 (×29): qty 1

## 2020-07-10 MED ORDER — POTASSIUM CHLORIDE 10 MEQ/100ML IV SOLN
10.0000 meq | INTRAVENOUS | Status: AC
  Administered 2020-07-10 (×5): 10 meq via INTRAVENOUS
  Filled 2020-07-10 (×5): qty 100

## 2020-07-10 NOTE — Evaluation (Signed)
Physical Therapy Assessment and Plan  Patient Details  Name: Amy Barry MRN: [id number] Date of Birth: June 18, 1970  PT Diagnosis: Abnormality of gait, Difficulty walking, Low back pain and Muscle weakness Rehab Potential: Good ELOS: 1-2 weeks   Today's Date: 07/10/2020 PT Individual Time: 1002-1100 PT Individual Time Calculation (min): 58 min    Hospital Problem: Principal Problem:   Subarachnoid bleed ([place]) Active Problems:   Essential hypertension   Gait abnormality   Past Medical History:  Past Medical History:  Diagnosis Date  . Arthralgia   . Generalized weakness    bilateral upper and lower extremities,  walks with cane  . GERD (gastroesophageal reflux disease)   . Headache(784.0)   . Heart murmur    per pt "since childhood, no problems"  . History of kidney stones   . Hypertension    followed pcp  . IBS (irritable bowel syndrome)   . Iron deficiency anemia    12-06-2017 per pt recently had IV iron infusion July 2019  . Low vitamin D level   . Osteoarthritis   . PVC (premature ventricular contraction)    per pt had holter monitor  . SLE (systemic lupus erythematosus) ([place])    rheumotologist-- dr syed  . Wears glasses    Past Surgical History:  Past Surgical History:  Procedure Laterality Date  . CERVICAL DISC ARTHROPLASTY N/A 10/23/2015   Procedure: Cervical Disc Arthroplasty Cervical six-seven;  Surgeon: Eustace Moore, MD;  Location: [facility];  Service: Neurosurgery;  Laterality: N/A;  . D & C HYSTEROSCOPY W/ RESECTION ENDOMETRIAL POLYP  10-13-2003    dr rivard '@[facility]'   . ESOPHAGOGASTRODUODENOSCOPY     for GERD  . IR ANGIO INTRA EXTRACRAN SEL COM CAROTID INNOMINATE BILAT MOD SED  07/06/2020  . IR ANGIO INTRA EXTRACRAN SEL INTERNAL CAROTID BILAT MOD SED  06/29/2020  . IR ANGIO VERTEBRAL SEL VERTEBRAL BILAT MOD SED  06/29/2020  . LAPAROSCOPIC ABDOMINAL EXPLORATION  1996   dx ibs  . OVARIAN CYST SURGERY  age 29   x2 cyst  . RADIOLOGY WITH ANESTHESIA N/A  06/29/2020   Procedure: IR WITH ANESTHESIA;  Surgeon: Radiologist, Medication, MD;  Location: [facility];  Service: Radiology;  Laterality: N/A;  . ROBOTIC ASSISTED TOTAL HYSTERECTOMY N/A 12/13/2017   Procedure: XI ROBOTIC ASSISTED TOTAL HYSTERECTOMYWITH BILATERAL SALPINGECTOMY;  Surgeon: Christophe Louis, MD;  Location: [facility];  Service: Gynecology;  Laterality: N/A;  . TUBAL LIGATION Bilateral 06-01-2002   dr Mancel Bale '@[facility]'    PPTL    Assessment & Plan Clinical Impression: Amy Barry. [name] is a 50 year old female with history of HTN, IBS, anemia, connective tissue disorder-on MTX/Plaquenil, family history of cerebral aneurysms who was admitted on 06/29/2020 after found down by daughter with slurred speech and confusion.  She was found to have Rodeo and posterior fossa felt to be aneurysmal in nature.  CTA head/neck did not show a definite source of bleed but had poor visualization of proximal left-ICA with tiny bulbous area and question of rupture or dissection.  She was evaluated by Dr. Kathyrn Sheriff and underwent cerebral angio with placement of right frontal ventricular drain and was negative for AVM, aneurysm, fistula or dissection.  Hospital course significant for issues with headaches with neck stiffness and upper back spasms, development small hematoma along course of drain in right frontal lobe as well as hypoglycemia.    SAH and IVH have resolved and intraventricular drain removed on 04/06 as follow-up CT without hydrocephalus.  Repeat cerebral angiogram on 04/04 was  negative for aneurysm, AVM or fistula.  She has had issues with progressive hyponatremia with drop in sodium to 127 and transient hypokalemia improved with supplementation.  Lethargy is resolving with improvement in mentation and participation, cognitive deficits with delay in processing, needs multimodal cues to follow one-step commands, balance deficits with tendency to drift to the left.  Patient with deficits in mobility and ability to carry out  ADLs.  CIR was recommended due to functional decline.  Patient currently requires mod with mobility secondary to muscle weakness and abnormal tone and decreased coordination.  Prior to hospitalization, patient was independent  with mobility and lived with Spouse,Family (spouse and 2 daughters, 41 y/o and 36 y/o) in a House home.  Home access is  Level entry.  Patient will benefit from skilled PT intervention to maximize safe functional mobility, minimize fall risk and decrease caregiver burden for planned discharge home with 24 hour supervision.  Anticipate patient will benefit from follow up [place] at discharge.  PT - End of Session Activity Tolerance: Tolerates 10 - 20 min activity with multiple rests Endurance Deficit: Yes PT Assessment Rehab Potential (ACUTE/IP ONLY): Good PT Barriers to Discharge: Home environment access/layout PT Patient demonstrates impairments in the following area(s): Balance;Safety;Endurance;Motor;Pain PT Transfers Functional Problem(s): Bed Mobility;Bed to Chair;Car;Furniture PT Locomotion Functional Problem(s): Ambulation;Stairs PT Plan PT Intensity: Minimum of 1-2 x/day ,45 to 90 minutes PT Duration Estimated Length of Stay: 1-2 weeks PT Treatment/Interventions: Ambulation/gait training;Community reintegration;Neuromuscular re-education;Stair training;UE/LE Strength taining/ROM;Balance/vestibular training;Discharge planning;Therapeutic Activities;UE/LE Coordination activities;Functional mobility training;Patient/family education;Therapeutic Exercise PT Transfers Anticipated Outcome(s): supervision PT Locomotion Anticipated Outcome(s): supervision PT Recommendation Follow Up Recommendations: Home health PT Patient destination: Home Equipment Recommended: To be determined;Rolling [place] with 5" wheels   PT Evaluation Precautions/Restrictions Precautions Precautions: Fall Restrictions Weight Bearing Restrictions: No General Chart Reviewed:  Yes Family/Caregiver Present: Yes Vital Signs Pain Pain Assessment Pain Scale: 0-10 Pain Score: 7  Pain Type: Acute pain Pain Location: Buttocks Pain Orientation: Left Pain Intervention(s): Ambulation/increased activity Home Living/Prior Functioning Home Living Available Help at Discharge: Family;Available PRN/intermittently Type of Home: House Home Access: Level entry Home Layout: Two level (pt stated she is unable to live on the main level. 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, museum for [employer] With: Spouse;Family (spouse and 2 daughters, 2 y/o and 58 y/o) Prior Function Level of Independence: Independent with homemaking with ambulation;Independent with basic ADLs  Able to Take Stairs?: Yes Driving: Yes Comments: uses cane PRN Vision/Perception  Perception Perception: Within Functional Limits Praxis Praxis: Impaired Praxis Impairment Details: Initiation;Motor planning  Cognition Overall Cognitive Status: Impaired/Different from baseline Arousal/Alertness: Awake/alert Orientation Level: Oriented X4 Sustained Attention: Impaired Memory: Impaired Immediate Memory Recall: Sock;Blue;Bed Memory Recall Sock: Without Cue Memory Recall Blue: With Cue Memory Recall Bed: With Cue Awareness: Impaired Problem Solving: Impaired Sensation Sensation Light Touch: Appears Intact Coordination Gross Motor Movements are Fluid and Coordinated: No Fine Motor Movements are Fluid and Coordinated: No Coordination and Movement Description: Decreased accuracy during functional reaching, grossly uncoordinated trunk and upper limbs Finger Nose Finger Test: Overshooting/undershooting Rt>Lt Heel Shin Test: unable to perform, c/o pain L buttock/rectal tube. Motor   Motor Motor: Abnormal postural alignment and control Motor - Skilled Clinical Observations: noted dyscoordination BLEs.   Trunk/Postural Assessment  Cervical Assessment Cervical Assessment: Exceptions to [facility] (Restricted due to pain) Thoracic Assessment Thoracic Assessment: Exceptions to [facility] (  rounded shoulders) Lumbar Assessment Lumbar Assessment: Exceptions to [facility] (posterior pelvic tilt) Postural Control Postural Control: Deficits on evaluation (posterior and Rt LOBs, pt needing Mod-Max A for sitting balance support during functional activity) Trunk Control: list to left  Balance Balance Balance Assessed: Yes Dynamic Sitting Balance Sitting balance - Comments: Dynamic sitting balance: Mod-Max A during functional activity, no UE support Dynamic Standing Balance Dynamic Standing - Balance Support:  (not assessed) Dynamic Standing - Level of Assistance: 4: Min assist Extremity Assessment  RUE Assessment Active Range of Motion (AROM) Comments: WNL LUE Assessment LUE Assessment: Within Functional Limits Active Range of Motion (AROM) Comments: WNL RLE Assessment RLE Assessment: Within Functional Limits General Strength Comments: grossly 4/5 LLE Assessment LLE Assessment: Within Functional Limits General Strength Comments: grossly 4/5  Care Tool Care Tool Bed Mobility Roll left and right activity   Roll left and right assist level: Minimal Assistance - Patient > 75%    Sit to lying activity        Lying to sitting edge of bed activity   Lying to sitting edge of bed assist level: Moderate Assistance - Patient 50 - 74%     Care Tool Transfers Sit to stand transfer   Sit to stand assist level: Moderate Assistance - Patient 50 - 74%    Chair/bed transfer   Chair/bed transfer assist level: Moderate Assistance - Patient 50 - 74%     Toilet transfer   Assist Level: Minimal Assistance - Patient > 75%    Car transfer   Car transfer assist level: Moderate Assistance -  Patient 50 - 74%      Care Tool Locomotion Ambulation   Assist level: Minimal Assistance - Patient > 75% Assistive device: [place]-rolling Max distance: 15  Walk 10 feet activity   Assist level: Minimal Assistance - Patient > 75% Assistive device: [place]-rolling   Walk 50 feet with 2 turns activity Walk 50 feet with 2 turns activity did not occur: Refused      Walk 150 feet activity Walk 150 feet activity did not occur: Refused      Walk 10 feet on uneven surfaces activity Walk 10 feet on uneven surfaces activity did not occur: Safety/medical concerns      Stairs   Assist level: Moderate Assistance - Patient - 50 - 74% Stairs assistive device: 2 hand rails Max number of stairs: 4  Walk up/down 1 step activity   Walk up/down 1 step (curb) assist level: Moderate Assistance - Patient - 50 - 74% Walk up/down 1 step or curb assistive device: 2 hand rails    Walk up/down 4 steps activity Walk up/down 4 steps assist level: Moderate Assistance - Patient - 50 - 74% Walk up/down 4 steps assistive device: 2 hand rails  Walk up/down 12 steps activity Walk up/down 12 steps activity did not occur: Safety/medical concerns      Pick up small objects from floor        Wheelchair Will patient use wheelchair at discharge?: No Type of Wheelchair: Manual Wheelchair activity did not occur: Refused      Wheel 50 feet with 2 turns activity      Wheel 150 feet activity        Refer to Care Plan for Long Term Goals  SHORT TERM GOAL WEEK 1 PT Short Term Goal 1 (Week 1): Pt will transfer sup to sit w/ log roll and min A PT Short Term Goal 2 (Week 1): Pt will transfer sit to stand and  SPT w/ min A PT Short Term Goal 3 (Week 1): Pt will amb 100' w/ RW and CGA.  Recommendations for other services: None   Skilled Therapeutic Intervention Evaluation completed (see details above and below) with education on PT POC and goals and individual treatment initiated with focus on balance, NRE, gait  training, stairs.  Pt presents supine in bed and c/o pain to L buttock "feels like something there"  Also c/o discomfort w/ rectal tube.  Rectal tube removed at conclusion of rx.  Pt requires encouragement to participate, but gradually improved participation.  Pt required verbal cues for log roll technique to improve sup to sit transfers.  Please refer to mobility below.  Pt amb into BR and sat on toilet at conclusion of rx.  Nursing entered and removed rectal tube in BR, pt handed off to nursing for return to bed.  Mobility Bed Mobility Bed Mobility: Rolling Right;Right Sidelying to Sit Rolling Right: Moderate Assistance - Patient 50-74% Right Sidelying to Sit: Moderate Assistance - Patient 50-74% Transfers Transfers: Sit to Stand;Stand to Sit;Stand Pivot Transfers Sit to Stand: Moderate Assistance - Patient 50-74% Stand to Sit: Moderate Assistance - Patient 50-74% Stand Pivot Transfers: Minimal Assistance - Patient > 75% Stand Pivot Transfer Details: Verbal cues for gait pattern Transfer (Assistive device): Rolling [place] Locomotion  Gait Ambulation: Yes Gait Assistance: Minimal Assistance - Patient > 75% Gait Distance (Feet): 15 Feet Assistive device: Rolling [place] Gait Assistance Details: Verbal cues for gait pattern;Verbal cues for precautions/safety Gait Gait: Yes Gait Pattern: Narrow base of support Gait velocity: decreased Stairs / Additional Locomotion Stairs: Yes Stairs Assistance: Moderate Assistance - Patient 50 - 74% Stair Management Technique: Two rails (step-to ascending, reciprocal descending.) Number of Stairs: 4 Height of Stairs: 6 Wheelchair Mobility Wheelchair Mobility: No   Discharge Criteria: Patient will be discharged from PT if patient refuses treatment 3 consecutive times without medical reason, if treatment goals not met, if there is a change in medical status, if patient makes no progress towards goals or if patient is discharged from hospital.  The  above assessment, treatment plan, treatment alternatives and goals were discussed and mutually agreed upon: by patient and by family  Ladoris Gene 07/10/2020, 12:40 PM

## 2020-07-10 NOTE — Evaluation (Signed)
Occupational Therapy Assessment and Plan  Patient Details  Name: Amy Barry MRN: [id number] Date of Birth: Aug 28, 1970  OT Diagnosis: abnormal posture, acute pain, cognitive deficits, disturbance of vision, muscle weakness (generalized) and pain in joint Rehab Potential: Rehab Potential (ACUTE ONLY): Good ELOS: 14-16 days   Today's Date: 07/10/2020 OT Individual Time: 6295-2841 OT Individual Time Calculation (min): 60 min     Hospital Problem: Principal Problem:   Subarachnoid bleed ([place]) Active Problems:   Essential hypertension   Gait abnormality   Past Medical History:  Past Medical History:  Diagnosis Date  . Arthralgia   . Generalized weakness    bilateral upper and lower extremities,  walks with cane  . GERD (gastroesophageal reflux disease)   . Headache(784.0)   . Heart murmur    per pt "since childhood, no problems"  . History of kidney stones   . Hypertension    followed pcp  . IBS (irritable bowel syndrome)   . Iron deficiency anemia    12-06-2017 per pt recently had IV iron infusion July 2019  . Low vitamin D level   . Osteoarthritis   . PVC (premature ventricular contraction)    per pt had holter monitor  . SLE (systemic lupus erythematosus) ([place])    rheumotologist-- dr syed  . Wears glasses    Past Surgical History:  Past Surgical History:  Procedure Laterality Date  . CERVICAL DISC ARTHROPLASTY N/A 10/23/2015   Procedure: Cervical Disc Arthroplasty Cervical six-seven;  Surgeon: Eustace Moore, MD;  Location: [place] [facility];  Service: Neurosurgery;  Laterality: N/A;  . D & C HYSTEROSCOPY W/ RESECTION ENDOMETRIAL POLYP  10-13-2003    dr rivard _0   . ESOPHAGOGASTRODUODENOSCOPY     for GERD  . IR ANGIO INTRA EXTRACRAN SEL COM CAROTID INNOMINATE BILAT MOD SED  07/06/2020  . IR ANGIO INTRA EXTRACRAN SEL INTERNAL CAROTID BILAT MOD SED  06/29/2020  . IR ANGIO VERTEBRAL SEL VERTEBRAL BILAT MOD SED  06/29/2020  . LAPAROSCOPIC ABDOMINAL EXPLORATION  1996   dx ibs   . OVARIAN CYST SURGERY  age 67   x2 cyst  . RADIOLOGY WITH ANESTHESIA N/A 06/29/2020   Procedure: IR WITH ANESTHESIA;  Surgeon: Radiologist, Medication, MD;  Location: [facility];  Service: Radiology;  Laterality: N/A;  . ROBOTIC ASSISTED TOTAL HYSTERECTOMY N/A 12/13/2017   Procedure: XI ROBOTIC ASSISTED TOTAL HYSTERECTOMYWITH BILATERAL SALPINGECTOMY;  Surgeon: Christophe Louis, MD;  Location: [facility];  Service: Gynecology;  Laterality: N/A;  . TUBAL LIGATION Bilateral 06-01-2002   dr Mancel Bale _1    PPTL    Assessment & Plan Clinical Impression: Amy Barry is a 50 year old female with history of HTN, IBS, anemia, connective tissue disorder-on MTX/Plaquenil, family history of cerebral aneurysms who was admitted on 06/29/2020 after found down by daughter with slurred speech and confusion.  She was found to have [place] and posterior fossa felt to be aneurysmal in nature.  CTA head/neck did not show a definite source of bleed but had poor visualization of proximal left-ICA with tiny bulbous area and question of rupture or dissection.  She was evaluated by Dr. Kathyrn Sheriff and underwent cerebral angio with placement of right frontal ventricular drain and was negative for AVM, aneurysm, fistula or dissection.  Hospital course significant for issues with headaches with neck stiffness and upper back spasms, development small hematoma along course of drain in right frontal lobe as well as hypoglycemia.    SAH and IVH have resolved and intraventricular drain removed on 04/06 as follow-up  CT without hydrocephalus.  Repeat cerebral angiogram on 04/04 was negative for aneurysm, AVM or fistula.  She has had issues with progressive hyponatremia with drop in sodium to 127 and transient hypokalemia improved with supplementation.  Lethargy is resolving with improvement in mentation and participation, cognitive deficits with delay in processing, needs multimodal cues to follow one-step commands, balance deficits with tendency to drift  to the left.  Patient with deficits in mobility and ability to carry out ADLs.  CIR was recommended due to functional decline.  Pt reports she has had neck pain/tight muscles and feels there must be something that hasn't been dx'd- I explained it appears to be myofascial pain- and it's usually due to the underlying cause requiring cervical fusion.  She also noted she's had chronic bladder incontinence- going all the time, but has new bowel incontinence. Still has a bad HA- as well as neck and shoulder pain (strigger points noted).  Also has intermittent numbness on the L half of her body- for a few hours at a time.  Has rectal tube right now.  Had a purewick- doesn't appear to be in place currently.  Also gets spasms "all over" and is fidgety currently- says she doesn't think she can make a "rational decision right now".  Patient currently requires max with basic self-care skills secondary to muscle weakness, decreased cardiorespiratoy endurance, impaired timing and sequencing, decreased coordination and decreased motor planning, decreased initiation, decreased attention, decreased awareness, decreased problem solving, decreased memory and delayed processing and decreased sitting balance and decreased postural control.  Prior to hospitalization, patient could complete BADLs with independent .  Patient will benefit from skilled intervention to increase independence with basic self-care skills prior to discharge home with family support.  Anticipate patient will require 24 hour supervision and follow up home health.  OT - End of Session Endurance Deficit: Yes OT Assessment Rehab Potential (ACUTE ONLY): Good OT Barriers to Discharge: Inaccessible home environment;Home environment access/layout;Decreased caregiver support;Lack of/limited family support;Incontinence OT Patient demonstrates impairments in the following area(s): Balance;Cognition;Endurance;Motor;Pain;Safety;Vision OT Basic ADL's  Functional Problem(s): Grooming;Bathing;Dressing;Toileting OT Advanced ADL's Functional Problem(s): Simple Meal Preparation OT Transfers Functional Problem(s): Toilet;Tub/Shower OT Additional Impairment(s): None OT Plan OT Intensity: Minimum of 1-2 x/day, 45 to 90 minutes OT Frequency: 5 out of 7 days OT Duration/Estimated Length of Stay: 14-16 days OT Treatment/Interventions: Balance/vestibular training;DME/adaptive equipment instruction;Patient/family education;Therapeutic Activities;Cognitive remediation/compensation;Psychosocial support;Therapeutic Exercise;UE/LE Strength taining/ROM;Self Care/advanced ADL retraining;Functional mobility training;Community reintegration;Discharge planning;Neuromuscular re-education;UE/LE Coordination activities;Pain management;Disease mangement/prevention OT Self Feeding Anticipated Outcome(s): No goal OT Basic Self-Care Anticipated Outcome(s): Supervision OT Toileting Anticipated Outcome(s): Supervision OT Bathroom Transfers Anticipated Outcome(s): Supervision OT Recommendation Recommendations for Other Services: Neuropsych consult;Therapeutic Recreation consult Therapeutic Recreation Interventions: [place] group Patient destination: Home Follow Up Recommendations: Home health OT Equipment Recommended: To be determined  OT Evaluation Precautions/Restrictions  Precautions Precautions: Fall Restrictions Weight Bearing Restrictions: No Pain Pain Assessment Pain Scale: 0-10 Pain Score: 7  Pain Type: Acute pain Pain Location: Buttocks Pain Orientation: Left Pain Intervention(s): Ambulation/increased activity Home Living/Prior Functioning Home Living Family/patient expects to be discharged to:: Private residence Living Arrangements: Spouse/significant other Available Help at Discharge: Family,Available PRN/intermittently Type of Home: House Home Access: Level entry Home Layout: Two level (pt stated she is unable to live on the main level.  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, museum for [employer] With: [employer] (  spouse and 2 daughters, 69 y/o and 86 y/o) IADL History Homemaking Responsibilities: Yes (pt reports doing the meals and taking care of the dog, cleaning responsibilities were split with her 2 daughters) Occupation: Full time employment Type of Occupation: Therapist, sports at [employer], working from home Leisure and Hobbies: taking care of the dog Prior Function Level of Independence: Independent with homemaking with ambulation,Independent with basic ADLs  Able to [place]?: Yes Driving: Yes Comments: uses cane PRN Vision Baseline Vision/History: Wears glasses Wears Glasses: At all times Patient Visual Report: Other (comment) (impaired depth perception + visual hallucinations) Perception  Perception: Within Functional Limits Praxis Praxis: Impaired Praxis Impairment Details: Initiation;Motor planning Cognition Overall Cognitive Status: Impaired/Different from baseline Arousal/Alertness: Awake/alert Orientation Level: Person;Place;Situation Person: Oriented Place: Oriented Situation: Oriented (grossly oriented to situation, stating that the doctors "did something to my brain." She needed assistance to remember that she had a brain bleed + drain for fluid control) Year: 2023 (Per pt "2022 or 2023?") Month: March Day of Week: Incorrect Memory: Impaired Immediate Memory Recall: Sock;Blue;Bed Memory Recall Sock: Without Cue Memory Recall Blue: With Cue Memory Recall Bed: With Cue Sustained Attention: Impaired Awareness: Impaired Problem Solving: Impaired Sensation Sensation Light Touch: Appears Intact Coordination Gross Motor Movements are Fluid and Coordinated: No Fine Motor Movements  are Fluid and Coordinated: No Coordination and Movement Description: Decreased accuracy during functional reaching, grossly uncoordinated trunk and upper limbs Finger Nose Finger Test: Overshooting/undershooting Rt>Lt Heel Shin Test: unable to perform, c/o pain L buttock/rectal tube. Motor  Motor Motor: Abnormal postural alignment and control Motor - Skilled Clinical Observations: noted dyscoordination BLEs.  Trunk/Postural Assessment  Cervical Assessment Cervical Assessment: Exceptions to [facility] (Restricted due to pain) Thoracic Assessment Thoracic Assessment: Exceptions to [facility] (rounded shoulders) Lumbar Assessment Lumbar Assessment: Exceptions to [facility] (posterior pelvic tilt) Postural Control Postural Control: Deficits on evaluation (posterior and Rt LOBs, pt needing Mod-Max A for sitting balance support during functional activity) Trunk Control: list to left  Balance Balance Balance Assessed: Yes Dynamic Sitting Balance Sitting balance - Comments: Dynamic sitting balance: Mod-Max A during functional activity, no UE support Dynamic Standing Balance Dynamic Standing - Balance Support:  (not assessed) Dynamic Standing - Level of Assistance: 4: Min assist Extremity/Trunk Assessment RUE Assessment Active Range of Motion (AROM) Comments: WNL LUE Assessment LUE Assessment: Within Functional Limits Active Range of Motion (AROM) Comments: WNL  Care Tool Care Tool Self Care Eating  not assessed      Oral Care    Oral Care Assist Level: Minimal Assistance - Patient > 75%    Bathing   Body parts bathed by patient: Right arm;Left arm;Chest;Abdomen;Front perineal area;Right upper leg;Left upper leg;Face Body parts bathed by helper: Buttocks;Right lower leg;Left lower leg   Assist Level: Maximal Assistance - Patient 24 - 49%    Upper Body Dressing(including orthotics)   What is the patient wearing?: Pull over shirt   Assist Level: Moderate Assistance - Patient 50 - 74%    Lower  Body Dressing (excluding footwear)   What is the patient wearing?: Pants;Incontinence brief Assist for lower body dressing: Maximal Assistance - Patient 25 - 49%    Putting on/Taking off footwear   What is the patient wearing?: Non-skid slipper socks Assist for footwear: Total Assistance - Patient < 25%       Care Tool Toileting Toileting activity Toileting Activity did not occur (Clothing management and hygiene only): N/A (no void or bm) Assist for toileting: Moderate Assistance - Patient 50 - 74%  Care Tool Transfers Sit to stand transfer   Sit to stand assist level: Moderate Assistance - Patient 50 - 74%    Chair/bed transfer   Chair/bed transfer assist level: Moderate Assistance - Patient 50 - 74%     Toilet transfer   Assist Level: Minimal Assistance - Patient > 75%     Care Tool Cognition Expression of Ideas and Wants Expression of Ideas and Wants: Without difficulty (complex and basic) - expresses complex messages without difficulty and with speech that is clear and easy to understand   Understanding Verbal and Non-Verbal Content Understanding Verbal and Non-Verbal Content: Understands (complex and basic) - clear comprehension without cues or repetitions   Memory/Recall Ability *first 3 days only Memory/Recall Ability *first 3 days only: Current season;Staff names and faces;That he or she is in a hospital/hospital unit    Refer to Care Plan for [place] 1 OT Short Term Goal 1 (Week 1): Pt will complete LB dressing at sit<stand level with 1 assist OT Short Term Goal 2 (Week 1): Pt will complete UB dressing while sitting unsupported with no more than Min balance assist OT Short Term Goal 3 (Week 1): Pt will complete toilet transfer with 1 assist and LRAD  Recommendations for other services: Neuropsych and Therapeutic Recreation  Kitchen group   Skilled Therapeutic Intervention Skilled therapeutic intervention completed with focus on  initial evaluation, education on OT role/POC, and establishment of patient-centered goals.   Pt greeted in bed with c/o cervical, buttocks, and LE pain. Note that pt restricted cervical movement throughout session due to pain. She had multiple lidoderm patches on neck and we utilized the heating pad for her legs at end of session. RN also notified to bring in some pain medicine at end of session. Pt was agreeable to engage in bathing/dressing tasks today. Max A for supine<sit with vcs for problem solving/motor planning. Once she sat up, pt required Mod-Max A for static balance, exhibited mostly posterior-Rt lean however did exhibit lateral swaying also. Pt required min cues for sequencing during UB self care and grooming tasks. Returned to bed to complete LB self care, pt requiring significantly increased time to initiate with vcs to motor plan rolling. Vcs also for sustained attention to task. Note that pt has a flexiseal. Max A for LB bathing/dressing including threading flexiseal into pants. Pt able to boost herself up in bed given min cues for sequencing. She was set up to eat her breakfast, left in care of RN.   ADL ADL Eating: Not assessed Grooming: Minimal assistance Where Assessed-Grooming: Edge of bed Upper Body Bathing: Moderate assistance Where Assessed-Upper Body Bathing: Edge of bed Lower Body Bathing: Maximal assistance Where Assessed-Lower Body Bathing: Bed level Upper Body Dressing: Moderate assistance Where Assessed-Upper Body Dressing: Edge of bed Lower Body Dressing: Maximal assistance Where Assessed-Lower Body Dressing: Bed level Toileting: Not assessed Toilet Transfer: Not assessed Tub/Shower Transfer: Not assessed Mobility  Mod A for rolling Rt>Lt during bed mobility  Discharge Criteria: Patient will be discharged from OT if patient refuses treatment 3 consecutive times without medical reason, if treatment goals not met, if there is a change in medical status, if patient  makes no progress towards goals or if patient is discharged from hospital.  The above assessment, treatment plan, treatment alternatives and goals were discussed and mutually agreed upon: by patient  Skeet Simmer 07/10/2020, 12:42 PM

## 2020-07-10 NOTE — Progress Notes (Signed)
Inpatient Rehabilitation Care Coordinator Assessment and Plan Patient Details  Name: Amy Barry MRN: [id number] Date of Birth: 10/12/1970  Today's Date: 07/10/2020  Hospital Problems: Principal Problem:   Subarachnoid bleed [facility]) Active Problems:   Essential hypertension   Gait abnormality  Past Medical History:  Past Medical History:  Diagnosis Date  . Arthralgia   . Generalized weakness    bilateral upper and lower extremities,  walks with cane  . GERD (gastroesophageal reflux disease)   . Headache(784.0)   . Heart murmur    per pt "since childhood, no problems"  . History of kidney stones   . Hypertension    followed pcp  . IBS (irritable bowel syndrome)   . Iron deficiency anemia    12-06-2017 per pt recently had IV iron infusion July 2019  . Low vitamin D level   . Osteoarthritis   . PVC (premature ventricular contraction)    per pt had holter monitor  . SLE (systemic lupus erythematosus) ([place])    rheumotologist-- dr syed  . Wears glasses    Past Surgical History:  Past Surgical History:  Procedure Laterality Date  . CERVICAL DISC ARTHROPLASTY N/A 10/23/2015   Procedure: Cervical Disc Arthroplasty Cervical six-seven;  Surgeon: Eustace Moore, MD;  Location: [place] [facility];  Service: Neurosurgery;  Laterality: N/A;  . D & C HYSTEROSCOPY W/ RESECTION ENDOMETRIAL POLYP  10-13-2003    dr rivard '@[facility]'   . ESOPHAGOGASTRODUODENOSCOPY     for GERD  . IR ANGIO INTRA EXTRACRAN SEL COM CAROTID INNOMINATE BILAT MOD SED  07/06/2020  . IR ANGIO INTRA EXTRACRAN SEL INTERNAL CAROTID BILAT MOD SED  06/29/2020  . IR ANGIO VERTEBRAL SEL VERTEBRAL BILAT MOD SED  06/29/2020  . LAPAROSCOPIC ABDOMINAL EXPLORATION  1996   dx ibs  . OVARIAN CYST SURGERY  age 55   x2 cyst  . RADIOLOGY WITH ANESTHESIA N/A 06/29/2020   Procedure: IR WITH ANESTHESIA;  Surgeon: Radiologist, Medication, MD;  Location: [facility];  Service: Radiology;  Laterality: N/A;  . ROBOTIC ASSISTED TOTAL HYSTERECTOMY N/A 12/13/2017    Procedure: XI ROBOTIC ASSISTED TOTAL HYSTERECTOMYWITH BILATERAL SALPINGECTOMY;  Surgeon: Christophe Louis, MD;  Location: [facility];  Service: Gynecology;  Laterality: N/A;  . TUBAL LIGATION Bilateral 06-01-2002   dr Mancel Bale '@[facility]'    PPTL   Social History:  reports that she has never smoked. She has never used smokeless tobacco. She reports that she does not drink alcohol and does not use drugs.  Family / Support Systems Marital Status: Married How Long?: 86 years Patient Roles: Spouse,Parent Spouse/Significant Other: [name] (husband): [phone] Children: 2 daughters: 65 and 25 years old. 68 y.o. going to [place] after high school graduation. Anticipated Caregiver: husband and 63 y.o. dtr Ability/Limitations of Caregiver: Husband and dtr both work. Husband works 2AM-11AM and dtr works 3hrs per day. Pt states there may be a few hours she is home by herself. Caregiver Availability: Intermittent Family Dynamics: Pt works from home and lives with her husband and two children.  Social History Preferred language: English Religion: Non-Denominational Cultural Background: Pt works as a Arts administrator: bachelors- BSN Read: Yes Write: Yes Employment Status: Employed Name of Employer: [employer] Length of Employment:  (5 years) Return to Work Plans: TBD Public relations account executive Issues: Denies Guardian/Conservator: N/A   Abuse/Neglect Abuse/Neglect Assessment Can Be Completed: Yes Physical Abuse: Denies Verbal Abuse: Denies Sexual Abuse: Denies Exploitation of patient/patient's resources: Denies Self-Neglect: Denies  Emotional Status Pt's affect, behavior and adjustment status: Pt in  good spirits at time of visit Recent Psychosocial Issues: Denies Psychiatric History: Denies Substance Abuse History: Denies  Patient / Family Perceptions, Expectations & Goals Pt/Family understanding of illness & functional limitations: Pt and family have a general understanding of care  needs Premorbid pt/family roles/activities: Independent Anticipated changes in roles/activities/participation: Assistance with ADLs/IADLs Pt/family expectations/goals: Pt goal is to regain strength, and learn how to get out of bed on own.  Community Resources Express Scripts: None Premorbid Home Care/DME Agencies: None Transportation available at discharge: Husband Resource referrals recommended: Neuropsychology  Discharge Planning Living Arrangements: Spouse/significant other,Children Support Systems: Spouse/significant other,Children Type of Residence: Private residence Administrator, sports: Multimedia programmer (specify) Scientist, clinical (histocompatibility and immunogenetics) (primary) and Garment/textile technologist) Financial Resources: Water quality scientist Screen Referred: No Living Expenses: Medical laboratory scientific officer Management: Spouse,Patient Does the patient have any problems obtaining your medications?: No Patient/Family Preliminary Plans: TBD Care Coordinator Barriers to Discharge: Decreased caregiver support,Lack of/limited family support Care Coordinator Anticipated Follow Up Needs: HH/OP  Clinical Impression SW met with pt and pt mother in room to introduce self, explain role, and discuss discharge process. Pt is not a English as a second language teacher. No HCPOA but would like information. DME, cane, and access to a rollator [place].   *SW provided pt with HCPOA packet.   Kaheem Halleck A Dreden Rivere 07/10/2020, 3:23 PM

## 2020-07-10 NOTE — Evaluation (Signed)
Speech Language Pathology Assessment and Plan  Patient Details  Name: Amy Barry MRN: [id number] Date of Birth: 1970-07-11  SLP Diagnosis: Cognitive Impairments;Speech and Language deficits  Rehab Potential: Good ELOS: 1-2 weeks 1-2 weeks   Today's Date: 07/10/2020 SLP Individual Time: 1330-1430 SLP Individual Time Calculation (min): 60 min   Hospital Problem: Principal Problem:   Subarachnoid bleed ([place]) Active Problems:   Essential hypertension   Gait abnormality  Past Medical History:  Past Medical History:  Diagnosis Date  . Arthralgia   . Generalized weakness    bilateral upper and lower extremities,  walks with cane  . GERD (gastroesophageal reflux disease)   . Headache(784.0)   . Heart murmur    per pt "since childhood, no problems"  . History of kidney stones   . Hypertension    followed pcp  . IBS (irritable bowel syndrome)   . Iron deficiency anemia    12-06-2017 per pt recently had IV iron infusion July 2019  . Low vitamin D level   . Osteoarthritis   . PVC (premature ventricular contraction)    per pt had holter monitor  . SLE (systemic lupus erythematosus) ([place])    rheumotologist-- dr syed  . Wears glasses    Past Surgical History:  Past Surgical History:  Procedure Laterality Date  . CERVICAL DISC ARTHROPLASTY N/A 10/23/2015   Procedure: Cervical Disc Arthroplasty Cervical six-seven;  Surgeon: Eustace Moore, MD;  Location: [facility];  Service: Neurosurgery;  Laterality: N/A;  . D & C HYSTEROSCOPY W/ RESECTION ENDOMETRIAL POLYP  10-13-2003    dr rivard '@[facility]'   . ESOPHAGOGASTRODUODENOSCOPY     for GERD  . IR ANGIO INTRA EXTRACRAN SEL COM CAROTID INNOMINATE BILAT MOD SED  07/06/2020  . IR ANGIO INTRA EXTRACRAN SEL INTERNAL CAROTID BILAT MOD SED  06/29/2020  . IR ANGIO VERTEBRAL SEL VERTEBRAL BILAT MOD SED  06/29/2020  . LAPAROSCOPIC ABDOMINAL EXPLORATION  1996   dx ibs  . OVARIAN CYST SURGERY  age 56   x2 cyst  . RADIOLOGY WITH ANESTHESIA N/A  06/29/2020   Procedure: IR WITH ANESTHESIA;  Surgeon: Radiologist, Medication, MD;  Location: [facility];  Service: Radiology;  Laterality: N/A;  . ROBOTIC ASSISTED TOTAL HYSTERECTOMY N/A 12/13/2017   Procedure: XI ROBOTIC ASSISTED TOTAL HYSTERECTOMYWITH BILATERAL SALPINGECTOMY;  Surgeon: Christophe Louis, MD;  Location: [facility];  Service: Gynecology;  Laterality: N/A;  . TUBAL LIGATION Bilateral 06-01-2002   dr Mancel Bale '@[facility]'    PPTL    Assessment / Plan / Recommendation  Amy Barry is a 50 year old female with history of HTN, IBS, anemia, connective tissue disorder-on MTX/Plaquenil, family history of cerebral aneurysms who was admitted on 06/29/2020 after found down by daughter with slurred speech and confusion. She was found to have [place] and posterior fossa felt to be aneurysmal in nature. CTA head/neck did not show a definite source of bleed but had poor visualization of proximal left-ICA with tiny bulbous area and question of rupture or dissection. She was evaluated by Dr. Kathyrn Sheriff and underwent cerebral angio with placement of right frontal ventricular drain and was negative for AVM, aneurysm, fistula or dissection. Hospital course significant for issues with headaches with neck stiffness and upper back spasms, development small hematoma along course of drain in right frontal lobe as well as hypoglycemia.   SAH and IVH have resolved and intraventricular drain removed on 04/06 as follow-up CT without hydrocephalus. Repeat cerebral angiogram on 04/04 was negative for aneurysm, AVM or fistula. She has had  issues with progressive hyponatremia with drop in sodium to 127 and transient hypokalemia improved with supplementation. Lethargy is resolving with improvement in mentation and participation, cognitive deficits with delay in processing, needs multimodal cues to follow one-step commands, balance deficits with tendency to drift to the left. Patient with deficits in mobility and ability to carry out ADLs.  CIR was recommended due to functional decline.  Clinical Impression Patient presents with a mild-moderate cognitive impairment and mild pragmatic/social language impairment. As per patient's report and confirmed by her mother who was present during the evaluation, patient has baseline cognitive deficits impacting her memory and with patient reporting (also shows SLP photo of her home work computer with sticky notes surrounding monitor) she has to write "cue cards" to remember things she needs to say (tele RN) including her own name. She also reported that when she is tired or feeling unwell, she is not able to perform job duties at all. During today's evaluation, patient was tested via the Cognistat and scored in mild impairment range for attention, moderate impairment range for memory and orientation. Patient did not demonstrate ability to independently problem solve, such as when she was not able to correctly state if it was day or night, despite cues such as sun coming through window and her lunch tray on table next to her. Patient exhibited uncertainty even with biographical questions such as her age, stating, "24?" and then looking at SLP as if to seek confirmation. She did exhibit some difficulty staying on topic when talking but was easily redirected. Patient is not currently able to perform ADL's without assistance and close supervision due to cognitive impairments and will require skilled SLP intervention at CIR level to maximize her cognitive and pragmatic language impairments.  Skilled Therapeutic Interventions          Speech-language and cognitive evaluation; Cognistat evaluation  SLP Assessment  Patient will need skilled [place] Pathology Services during CIR admission    Recommendations  Recommendations for Other Services: Neuropsych consult Patient destination: Home Follow up Recommendations: Home Health SLP;24 hour supervision/assistance;Outpatient SLP Equipment Recommended: None  recommended by SLP    SLP Frequency 3 to 5 out of 7 days   SLP Duration  SLP Intensity  SLP Treatment/Interventions 1-2 weeks  Minumum of 1-2 x/day, 30 to 90 minutes  Cognitive remediation/compensation;Internal/external aids;Medication managment;Patient/family education;Therapeutic Activities;Cueing hierarchy;Functional tasks    Pain Pain Assessment Pain Scale: 0-10 Pain Score: 4  Pain Type: Acute pain Pain Location: Abdomen Pain Descriptors / Indicators: Aching;Discomfort Pain Onset: On-going Pain Intervention(s): Other (Comment) (monitored, RN has already been addressing this issue) Multiple Pain Sites: No  Prior Functioning Cognitive/Linguistic Baseline: Baseline deficits Baseline deficit details: Patient reports memory deficits for past 1-2 years and that 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 Type of Home: House  Lives With: Spouse;Family Available Help at Discharge: Family;Available PRN/intermittently Education: RN Vocation: Full time employment  SLP Evaluation Cognition Overall Cognitive Status: Impaired/Different from baseline Arousal/Alertness: Awake/alert Orientation Level: Oriented to place;Oriented to situation;Oriented to person;Disoriented to time;Other (comment) (oriented to season, stated month as April or March, year as "23") Attention: Sustained Sustained Attention: Impaired Sustained Attention Impairment: Verbal basic;Verbal complex Memory: Impaired Memory Impairment: Other (comment) (recalled 1/4 words after 4 minute delay without cues and recalled 3/4 words with 3-choice cues) Problem Solving: Impaired Problem Solving Impairment: Verbal basic;Verbal complex Safety/Judgment: Impaired  Comprehension Auditory Comprehension Overall Auditory Comprehension: Impaired Yes/No Questions: Within Functional Limits Commands: Impaired One Step Basic Commands: 75-100% accurate Two Step  Basic Commands: 75-100% accurate Multistep Basic Commands:  50-74% accurate Conversation: Simple Interfering Components: Attention;Processing speed EffectiveTechniques: Extra processing time;Repetition Expression Expression Primary Mode of Expression: Verbal Verbal Expression Overall Verbal Expression: Impaired Initiation: No impairment Level of Generative/Spontaneous Verbalization: Conversation Repetition: No impairment Naming: No impairment Pragmatics: Impairment Impairments: Topic maintenance Oral Motor Oral Motor/Sensory Function Overall Oral Motor/Sensory Function: Within functional limits  Care Tool Care Tool Cognition Expression of Ideas and Wants     Understanding Verbal and Non-Verbal Content     Memory/Recall Ability *first 3 days only        Short Term Goals: Week 1: SLP Short Term Goal 1 (Week 1): Patient will recall at least one activity completed in each of the therapy disciplines (PT, OT, ST) and at least one intervention from nursing with  [place]. SLP Short Term Goal 2 (Week 1): Patient will answer delayed recall questions after listening to short (few sentence paragraph level) text with 80% accuracy and minA. SLP Short Term Goal 3 (Week 1): Patient will summarize after SLP read aloud of 3-4 sentence paragraph with 75% accuracy and modA. SLP Short Term Goal 4 (Week 1): Patient will utilize strategies and environmental cues to orient to time of day, date and recall daily activities completed with minA cues.  Refer to Care Plan for Long Term Goals  Recommendations for other services: Neuropsych  Discharge Criteria: Patient will be discharged from SLP if patient refuses treatment 3 consecutive times without medical reason, if treatment goals not met, if there is a change in medical status, if patient makes no progress towards goals or if patient is discharged from hospital.  The above assessment, treatment plan, treatment alternatives and goals were discussed and mutually agreed upon: by patient and by family  Sonia Baller, MA, CCC-SLP Speech Therapy

## 2020-07-10 NOTE — Plan of Care (Signed)
  Problem: Sit to Stand Goal: LTG:  Patient will perform sit to stand in prep for activites of daily living with assistance level (OT) Description: LTG:  Patient will perform sit to stand in prep for activites of daily living with assistance level (OT) Flowsheets (Taken 07/10/2020 1600) LTG: PT will perform sit to stand in prep for activites of daily living with assistance level: Supervision/Verbal cueing   Problem: RH Grooming Goal: LTG Patient will perform grooming w/assist,cues/equip (OT) Description: LTG: Patient will perform grooming with assist, with/without cues using equipment (OT) Flowsheets (Taken 07/10/2020 1600) LTG: Pt will perform grooming with assistance level of: Supervision/Verbal cueing   Problem: RH Bathing Goal: LTG Patient will bathe all body parts with assist levels (OT) Description: LTG: Patient will bathe all body parts with assist levels (OT) Flowsheets (Taken 07/10/2020 1600) LTG: Pt will perform bathing with assistance level/cueing: Supervision/Verbal cueing   Problem: RH Dressing Goal: LTG Patient will perform upper body dressing (OT) Description: LTG Patient will perform upper body dressing with assist, with/without cues (OT). Flowsheets (Taken 07/10/2020 1600) LTG: Pt will perform upper body dressing with assistance level of: Supervision/Verbal cueing Goal: LTG Patient will perform lower body dressing w/assist (OT) Description: LTG: Patient will perform lower body dressing with assist, with/without cues in positioning using equipment (OT) Flowsheets (Taken 07/10/2020 1600) LTG: Pt will perform lower body dressing with assistance level of: Supervision/Verbal cueing   Problem: RH Toileting Goal: LTG Patient will perform toileting task (3/3 steps) with assistance level (OT) Description: LTG: Patient will perform toileting task (3/3 steps) with assistance level (OT)  Flowsheets (Taken 07/10/2020 1600) LTG: Pt will perform toileting task (3/3 steps) with assistance level:  Supervision/Verbal cueing   Problem: RH Toilet Transfers Goal: LTG Patient will perform toilet transfers w/assist (OT) Description: LTG: Patient will perform toilet transfers with assist, with/without cues using equipment (OT) Flowsheets (Taken 07/10/2020 1600) LTG: Pt will perform toilet transfers with assistance level of: Supervision/Verbal cueing   Problem: RH Tub/Shower Transfers Goal: LTG Patient will perform tub/shower transfers w/assist (OT) Description: LTG: Patient will perform tub/shower transfers with assist, with/without cues using equipment (OT) Flowsheets (Taken 07/10/2020 1600) LTG: Pt will perform tub/shower stall transfers with assistance level of: Supervision/Verbal cueing

## 2020-07-10 NOTE — IPOC Note (Signed)
Overall Plan of Care [facility]) Patient Details Name: Amy Barry MRN: [id number] DOB: 06-12-1970  Admitting Diagnosis: Subarachnoid bleed [facility])  Hospital Problems: Principal Problem:   Subarachnoid bleed ([place]) Active Problems:   Essential hypertension   Gait abnormality     Functional Problem List: Nursing Bladder,Bowel,Edema,Endurance,Medication Management,Pain,Safety,Skin Integrity  PT Balance,Safety,Endurance,Motor,Pain  OT Balance,Cognition,Endurance,Motor,Pain,Safety,Vision  SLP Cognition,Perception,Safety  TR         Basic ADL's: OT Grooming,Bathing,Dressing,Toileting     Advanced  ADL's: OT Simple Meal Preparation     Transfers: PT Bed Mobility,Bed to Chair,Car,Furniture  OT Toilet,Tub/Shower     Locomotion: PT Ambulation,Stairs     Additional Impairments: OT None  SLP Social Cognition   Social Interaction  TR      Anticipated Outcomes Item Anticipated Outcome  Self Feeding No goal  Swallowing  N/A   Basic self-care  Media planner Transfers Supervision  Bowel/Bladder  Supervision  Transfers  supervision  Locomotion  supervision  Communication  mod I  Cognition  supervision safety, minA mild complex ADL's  Pain  <4  Safety/Judgment  Supervision with no falls   Therapy Plan: PT Intensity: Minimum of 1-2 x/day ,45 to 90 minutes PT Duration Estimated Length of Stay: 1-2 weeks OT Intensity: Minimum of 1-2 x/day, 45 to 90 minutes OT Frequency: 5 out of 7 days OT Duration/Estimated Length of Stay: 14-16 days SLP Intensity: Minumum of 1-2 x/day, 30 to 90 minutes SLP Frequency: 3 to 5 out of 7 days SLP Duration/Estimated Length of Stay: 1-2 weeks   Due to the current state of emergency, patients may not be receiving their 3-hours of Medicare-mandated therapy.   Team Interventions: Nursing Interventions Patient/Family Education,Bladder Management,Bowel Management,Disease Management/Prevention,Pain  Management,Medication Management,Skin Care/Wound Management,Discharge Planning,Psychosocial Support  PT interventions Ambulation/gait training,Community reintegration,Neuromuscular re-education,Stair training,UE/LE Strength taining/ROM,Balance/vestibular training,Discharge planning,Therapeutic Activities,UE/LE Coordination activities,Functional mobility training,Patient/family education,Therapeutic Exercise  OT Interventions Balance/vestibular training,DME/adaptive equipment instruction,Patient/family education,Therapeutic Activities,Cognitive remediation/compensation,Psychosocial support,Therapeutic Exercise,UE/LE Strength taining/ROM,Self Care/advanced ADL retraining,Functional mobility training,Community reintegration,Discharge planning,Neuromuscular re-education,UE/LE Coordination activities,Pain management,Disease mangement/prevention  SLP Interventions Cognitive remediation/compensation,Internal/external aids,Medication managment,Patient/family education,Therapeutic Activities,Cueing hierarchy,Functional tasks  TR Interventions    SW/CM Interventions Discharge Planning,Psychosocial Support,Patient/Family Education   Barriers to Discharge MD  Medical stability  Nursing Decreased caregiver support,Home environment access/layout,Incontinence,Wound Care,Lack of/limited family support,Medication compliance,Behavior    PT Home environment access/layout    OT Inaccessible home environment,Home environment access/layout,Decreased caregiver support,Lack of/limited family support,Incontinence    SLP Decreased caregiver support    SW Decreased caregiver support,Lack of/limited family support     Team Discharge Planning: Destination: PT-Home ,OT- Home , SLP-Home Projected Follow-up: PT-Home health PT, OT-  Home health OT, SLP-Home Health SLP,24 hour supervision/assistance,Outpatient SLP Projected Equipment Needs: PT-To be determined,Rolling [place] with 5" wheels, OT- To be determined, SLP-None  recommended by SLP Equipment Details: PT- , OT-  Patient/family involved in discharge planning: PT- Family member/caregiver,Patient,  OT-Patient, SLP-Patient,Family member/caregiver  MD ELOS: 14-16 days Medical Rehab Prognosis:  Excellent Assessment: The patient has been admitted for CIR therapies with the diagnosis of ICH. The team will be addressing functional mobility, strength, stamina, balance, safety, adaptive techniques and equipment, self-care, bowel and bladder mgt, patient and caregiver education, NMR, cognition, communication, pain control, community reentrey. Goals have been set at supervision for mobility and self-care and mod I to supervision for cognition/communication.   Due to the current state of emergency, patients may not be receiving their 3 hours per day of Medicare-mandated therapy.    Meredith Staggers, MD, Mellody Drown  See Team Conference Notes for weekly updates to the plan of care

## 2020-07-10 NOTE — Progress Notes (Addendum)
PROGRESS NOTE   Subjective/Complaints: Pt had a fair night. Flex-seal uncomfortable. Mild headache. Also complains of aching pain from hips to lower legs. Everything "taste like metal".   ROS: Patient denies fever, rash, sore throat, blurred vision, nausea, vomiting, diarrhea, cough, shortness of breath or chest pain,   headache, or mood change.    Objective:   No results found. Recent Labs    07/09/20 1634 07/10/20 0531  WBC 11.3* 9.9  HGB 11.7* 12.1  HCT 34.2* 33.7*  PLT 317 316   No results for input(s): NA, K, CL, CO2, GLUCOSE, BUN, CREATININE, CALCIUM in the last 72 hours.  Intake/Output Summary (Last 24 hours) at 07/10/2020 1048 Last data filed at 07/10/2020 0833 Gross per 24 hour  Intake 400 ml  Output 500 ml  Net -100 ml        Physical Exam: Vital Signs Blood pressure (!) 143/81, pulse 99, temperature 100.3 F (37.9 C), resp. rate 18, height 5\' 4"  (1.626 m), weight 82.9 kg, last menstrual period 12/22/2017, SpO2 100 %.  General: Alert and oriented x 3, No apparent distress HEENT: Head is normocephalic, atraumatic, PERRLA, EOMI, sclera anicteric, oral mucosa pink and moist, dentition intact, ext ear canals clear,  Neck: Supple without JVD or lymphadenopathy Heart: Reg rate and rhythm. No murmurs rubs or gallops Chest: CTA bilaterally without wheezes, rales, or rhonchi; no distress Abdomen/GI: Soft, non-tender, non-distended, bowel sounds positive. Rectal tube in place connected to foley bag Extremities: No clubbing, cyanosis, or edema. Pulses are 2+ Psych: Pt's affect is appropriate. Pt is cooperative Skin: right frontal scalp clean with staples in incision Neuro: Pt is cognitively appropriate with normal insight, memory, and awareness. Cranial nerves 2-12 are intact. Sensory exam is normal. Reflexes are 2+ in all 4's. Fine motor coordination is intact. No tremors. Motor function is grossly 5/5 in UE. LE:  limited d/t pain proximall, 2/5 HF, KE and 4+/5 ADF/PF. Marland Kitchen  Musculoskeletal: had pain at bilateral PSIS, left more than right. Pain also in proximal legs with AROM/PROM.     Assessment/Plan: 1. Functional deficits which require 3+ hours per day of interdisciplinary therapy in a comprehensive inpatient rehab setting.  Physiatrist is providing close team supervision and 24 hour management of active medical problems listed below.  Physiatrist and rehab team continue to assess barriers to discharge/monitor patient progress toward functional and medical goals  Care Tool:  Bathing              Bathing assist       Upper Body Dressing/Undressing Upper body dressing        Upper body assist      Lower Body Dressing/Undressing Lower body dressing            Lower body assist       Toileting Toileting    Toileting assist Assist for toileting: Moderate Assistance - Patient 50 - 74%     Transfers Chair/bed transfer  Transfers assist           Locomotion Ambulation   Ambulation assist              Walk 10 feet activity   Assist  Walk 50 feet activity   Assist           Walk 150 feet activity   Assist           Walk 10 feet on uneven surface  activity   Assist           Wheelchair     Assist               Wheelchair 50 feet with 2 turns activity    Assist            Wheelchair 150 feet activity     Assist          Blood pressure (!) 143/81, pulse 99, temperature 100.3 F (37.9 C), resp. rate 18, height 5\' 4"  (1.626 m), weight 82.9 kg, last menstrual period 12/22/2017, SpO2 100 %.  Medical Problem List and Plan: 1.  Impaired cognition/mobility and ADLs secondary to [facility] within basal cisterns and ventricular system-               -patient may shower             -ELOS/Goals: 14-18 days- supervision to min A  -Patient is beginning CIR therapies today including PT, OT, and SLP  2.   Antithrombotics: -DVT/anticoagulation:  Mechanical: Sequential compression devices, below knee Bilateral lower extremities             -antiplatelet therapy: N/A 3. Pain Management: Was using NSAIDS tid at home             --will continue Oxycodone as needed  -gabapentin/kpad for myalgias, pain per home regimen 4. Mood: LCSW to follow for evaluation and support.                         -antipsychotic agents: N/A 5. Neuropsych: This patient is not fully capable of making decisions on her own behalf. 6. Skin/Wound Care: Routine pressure-relief measures.  7. Fluids/Electrolytes/Nutrition:    -hypokalemia: 2.6   -runs of K+ IV today   -recheck 4.9  8. Hyponatremia:  Sodium--127 4/7--> 130 4/8  -serum osmolality 274             --continue 1500 cc FR  .             -continue salt tabs 9.  Seizure prophylaxis: Continue Keppra twice daily.   10. HTN/Tachycardia: Monitor blood pressures 3 times daily.             --Continue Nimotop every 4 hours to prevent vasospasms.             --Continue metoprolol and amlodipine                --lasix stopped d/t hyponatremia--continue to hold. 11.  Recurrent hypokalemia:   Kdur for supplementation daily.  -labs pending today 12.  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 (Had intermittent weakness with RLE with foot drop progressing to left sided weakness--needed therapy X 2 years for recovery)             --  methotrexate--d'ced per patient request.             --was off plaquenil due to diarrhea.  daily X 6 more days then off.               13.  Stress related hyperglycemia: Hgb A1c-5.4.  Continue to monitor blood sugars AC/at bedtime.             --  sliding scale insulin for elevated blood sugars. 14.  Hypocalcemia: Add calcium for oral supplementation 15. Bowel incontinence: weaning off plaquenil  -stopped softeners and laxatives  -add probiotic, fiber  -dc rectal tube 16. Fevers:100.3 this morning 4/8  -CBC unremarkable this  morning  -dopplers negative  -check UA/UCX  -IS/OOB  - Sepsis protocol prn T>100.5.   -if temps/loose stool continue, consider sending stool to lab     LOS: 1 days A FACE TO [place] 07/10/2020, 10:48 AM

## 2020-07-10 NOTE — Progress Notes (Signed)
Bilateral lower extremity venous duplex has been completed. Preliminary results can be found in CV Proc through chart review.   07/10/20 1:06 PM Amy Barry RVT

## 2020-07-11 ENCOUNTER — Inpatient Hospital Stay (HOSPITAL_COMMUNITY): Payer: No Typology Code available for payment source

## 2020-07-11 DIAGNOSIS — I609 Nontraumatic subarachnoid hemorrhage, unspecified: Secondary | ICD-10-CM | POA: Diagnosis not present

## 2020-07-11 LAB — URINALYSIS, COMPLETE (UACMP) WITH MICROSCOPIC
Bacteria, UA: NONE SEEN
Bilirubin Urine: NEGATIVE
Glucose, UA: NEGATIVE mg/dL
Ketones, ur: NEGATIVE mg/dL
Leukocytes,Ua: NEGATIVE
Nitrite: NEGATIVE
Protein, ur: NEGATIVE mg/dL
Specific Gravity, Urine: 1.026 (ref 1.005–1.030)
pH: 5 (ref 5.0–8.0)

## 2020-07-11 LAB — BASIC METABOLIC PANEL
Anion gap: 9 (ref 5–15)
BUN: 9 mg/dL (ref 6–20)
CO2: 21 mmol/L — ABNORMAL LOW (ref 22–32)
Calcium: 8.9 mg/dL (ref 8.9–10.3)
Chloride: 98 mmol/L (ref 98–111)
Creatinine, Ser: 0.59 mg/dL (ref 0.44–1.00)
GFR, Estimated: >60 mL/min (ref >60)
Glucose, Bld: 117 mg/dL — ABNORMAL HIGH (ref 70–99)
Potassium: 3.6 mmol/L (ref 3.5–5.1)
Sodium: 128 mmol/L — ABNORMAL LOW (ref 135–145)

## 2020-07-11 LAB — GLUCOSE, CAPILLARY
Glucose-Capillary: 101 mg/dL — ABNORMAL HIGH (ref 70–99)
Glucose-Capillary: 102 mg/dL — ABNORMAL HIGH (ref 70–99)
Glucose-Capillary: 106 mg/dL — ABNORMAL HIGH (ref 70–99)
Glucose-Capillary: 101 mg/dL — ABNORMAL HIGH (ref 70–99)

## 2020-07-11 MED ORDER — TROLAMINE SALICYLATE 10 % EX CREA
TOPICAL_CREAM | Freq: Two times a day (BID) | CUTANEOUS | Status: DC | PRN
  Filled 2020-07-11: qty 85

## 2020-07-11 MED ORDER — MUSCLE RUB 10-15 % EX CREA
TOPICAL_CREAM | Freq: Two times a day (BID) | CUTANEOUS | Status: DC | PRN
  Filled 2020-07-11: qty 85

## 2020-07-11 MED ORDER — SIMETHICONE 80 MG PO CHEW
80.0000 mg | CHEWABLE_TABLET | Freq: Four times a day (QID) | ORAL | Status: DC | PRN
  Administered 2020-07-11 – 2020-07-14 (×4): 80 mg via ORAL
  Filled 2020-07-11 (×5): qty 1

## 2020-07-11 NOTE — Progress Notes (Signed)
MD notified of T- 100.3; new orders noted.

## 2020-07-11 NOTE — Progress Notes (Signed)
Per daughte. Patient has been having fevers even before her admission in the hospital. She will have a low grade fever when she is in pain  and will take advil at home and fever will be resolved.

## 2020-07-11 NOTE — Progress Notes (Signed)
PROGRESS NOTE   Subjective/Complaints:  Back and buttock pain some radiating pain down post thighs, discussed labwork and test results  Was doing stairs with PT yesterday   ROS: Patient denies CP, SOB, N/V/D Objective:   VAS [name] LOWER EXTREMITY VENOUS (DVT)  Result Date: 07/10/2020  Lower Venous DVT Study Indications: FUO.  Risk Factors: None identified. Comparison Study: No prior studies. Performing Technologist: Oliver Hum RVT  Examination Guidelines: A complete evaluation includes B-mode imaging, spectral Doppler, color Doppler, and power Doppler as needed of all accessible portions of each vessel. Bilateral testing is considered an integral part of a complete examination. Limited examinations for reoccurring indications may be performed as noted. The reflux portion of the exam is performed with the patient in reverse Trendelenburg.  +---------+---------------+---------+-----------+----------+--------------+ RIGHT    CompressibilityPhasicitySpontaneityPropertiesThrombus Aging +---------+---------------+---------+-----------+----------+--------------+ CFV      Full           Yes      Yes                                 +---------+---------------+---------+-----------+----------+--------------+ SFJ      Full                                                        +---------+---------------+---------+-----------+----------+--------------+ FV Prox  Full                                                        +---------+---------------+---------+-----------+----------+--------------+ FV Mid   Full                                                        +---------+---------------+---------+-----------+----------+--------------+ FV DistalFull                                                        +---------+---------------+---------+-----------+----------+--------------+ PFV      Full                                                         +---------+---------------+---------+-----------+----------+--------------+ POP      Full           Yes      Yes                                 +---------+---------------+---------+-----------+----------+--------------+  PTV      Full                                                        +---------+---------------+---------+-----------+----------+--------------+ PERO     Full                                                        +---------+---------------+---------+-----------+----------+--------------+   +---------+---------------+---------+-----------+----------+--------------+ LEFT     CompressibilityPhasicitySpontaneityPropertiesThrombus Aging +---------+---------------+---------+-----------+----------+--------------+ CFV      Full           Yes      Yes                                 +---------+---------------+---------+-----------+----------+--------------+ SFJ      Full                                                        +---------+---------------+---------+-----------+----------+--------------+ FV Prox  Full                                                        +---------+---------------+---------+-----------+----------+--------------+ FV Mid   Full                                                        +---------+---------------+---------+-----------+----------+--------------+ FV DistalFull                                                        +---------+---------------+---------+-----------+----------+--------------+ PFV      Full                                                        +---------+---------------+---------+-----------+----------+--------------+ POP      Full           Yes      Yes                                 +---------+---------------+---------+-----------+----------+--------------+ PTV      Full                                                         +---------+---------------+---------+-----------+----------+--------------+  PERO     Full                                                        +---------+---------------+---------+-----------+----------+--------------+     Summary: RIGHT: - There is no evidence of deep vein thrombosis in the lower extremity.  - No cystic structure found in the popliteal fossa.  LEFT: - There is no evidence of deep vein thrombosis in the lower extremity.  - No cystic structure found in the popliteal fossa.  *See table(s) above for measurements and observations. Electronically signed by Deitra Mayo MD on 07/10/2020 at 3:41:09 PM.    Final    Recent Labs    07/09/20 1634 07/10/20 0531  WBC 11.3* 9.9  HGB 11.7* 12.1  HCT 34.2* 33.7*  PLT 317 316   Recent Labs    07/10/20 1010 07/10/20 1607 07/11/20 0513  NA 130*  --  128*  K 2.6* 3.2* 3.6  CL 96*  --  98  CO2 24  --  21*  GLUCOSE 102*  --  117*  BUN 8  --  9  CREATININE 0.72  --  0.59  CALCIUM 9.0  --  8.9    Intake/Output Summary (Last 24 hours) at 07/11/2020 0802 Last data filed at 07/11/2020 0030 Gross per 24 hour  Intake 760 ml  Output --  Net 760 ml        Physical Exam: Vital Signs Blood pressure (!) 152/91, pulse 90, temperature 99.3 F (37.4 C), temperature source Oral, resp. rate 20, height 5\' 4"  (1.626 m), weight 82.9 kg, last menstrual period 12/22/2017, SpO2 100 %.   General: No acute distress Mood and affect are appropriate Heart: Regular rate and rhythm no rubs murmurs or extra sounds Lungs: Clear to auscultation, breathing unlabored, no rales or wheezes Abdomen: Positive bowel sounds, soft nontender to palpation, nondistended Extremities: No clubbing, cyanosis, or edema Skin: No evidence of breakdown, no evidence of rash  Skin: right frontal scalp clean with staples in incision Neuro: Pt is cognitively appropriate with normal insight, memory, and awareness. Cranial nerves 2-12 are intact. Sensory exam is  normal. Reflexes are 2+ in all 4's. Fine motor coordination is intact. No tremors. Motor function is grossly 5/5 in UE. LE: limited d/t pain proximall, 2/5 HF, KE and 4+/5 ADF/PF. Marland Kitchen  Musculoskeletal: had pain at bilateral PSIS, left more than right. No pain with hip ROM + bilat gluteal tenderness     Assessment/Plan: 1. Functional deficits which require 3+ hours per day of interdisciplinary therapy in a comprehensive inpatient rehab setting.  Physiatrist is providing close team supervision and 24 hour management of active medical problems listed below.  Physiatrist and rehab team continue to assess barriers to discharge/monitor patient progress toward functional and medical goals  Care Tool:  Bathing    Body parts bathed by patient: Right arm,Left arm,Chest,Abdomen,Front perineal area,Right upper leg,Left upper leg,Face   Body parts bathed by helper: Buttocks,Right lower leg,Left lower leg     Bathing assist Assist Level: Maximal Assistance - Patient 24 - 49%     Upper Body Dressing/Undressing Upper body dressing   What is the patient wearing?: Pull over shirt,Hospital gown only    Upper body assist Assist Level: Minimal Assistance - Patient > 75%    Lower Body Dressing/Undressing Lower body dressing  What is the patient wearing?: Incontinence brief     Lower body assist Assist for lower body dressing: Maximal Assistance - Patient 25 - 49%     Toileting Toileting Toileting Activity did not occur (Clothing management and hygiene only): N/A (no void or bm)  Toileting assist Assist for toileting: Moderate Assistance - Patient 50 - 74%     Transfers Chair/bed transfer  Transfers assist     Chair/bed transfer assist level: Moderate Assistance - Patient 50 - 74%     Locomotion Ambulation   Ambulation assist      Assist level: Minimal Assistance - Patient > 75% Assistive device: Gritz-rolling Max distance: 15   Walk 10 feet activity   Assist      Assist level: Minimal Assistance - Patient > 75% Assistive device: Argabright-rolling   Walk 50 feet activity   Assist Walk 50 feet with 2 turns activity did not occur: Refused         Walk 150 feet activity   Assist Walk 150 feet activity did not occur: Refused         Walk 10 feet on uneven surface  activity   Assist Walk 10 feet on uneven surfaces activity did not occur: Safety/medical concerns         Wheelchair     Assist Will patient use wheelchair at discharge?: No Type of Wheelchair: Manual Wheelchair activity did not occur: Refused         Wheelchair 50 feet with 2 turns activity    Assist            Wheelchair 150 feet activity     Assist          Blood pressure (!) 152/91, pulse 90, temperature 99.3 F (37.4 C), temperature source Oral, resp. rate 20, height 5\' 4"  (1.626 m), weight 82.9 kg, last menstrual period 12/22/2017, SpO2 100 %.  Medical Problem List and Plan: 1.  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  Antithrombotics: -DVT/anticoagulation:  Mechanical: Sequential compression devices, below knee Bilateral lower extremities             -antiplatelet therapy: N/A 3. Pain Management: Was using NSAIDS tid at home             --will continue Oxycodone as needed  -gabapentin/kpad for myalgias, pain per home regimen 4. Mood: LCSW to follow for evaluation and support.                         -antipsychotic agents: N/A 5. Neuropsych: This patient is not fully capable of making decisions on her own behalf. 6. Skin/Wound Care: Routine pressure-relief measures.  7. Fluids/Electrolytes/Nutrition:    -hypokalemia: 2.6   -runs of K+ IV today   -recheck 4.9  8. Hyponatremia:  Sodium--127 4/7--> 130 4/8-->  128 4/9  -serum osmolality 274             --continue 1500 cc FR  .             -continue salt tabs 9.  Seizure prophylaxis: Continue Keppra twice daily.   10. HTN/Tachycardia: Monitor blood pressures 3 times daily.             --  Continue Nimotop every 4 hours to prevent vasospasms.             --Continue metoprolol and amlodipine                --lasix stopped d/t hyponatremia--continue to hold. 11.  Recurrent hypokalemia:   Kdur for supplementation daily.  -labs pending today 12.  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 (Had intermittent weakness with RLE with foot drop progressing to left sided weakness--needed therapy X 2 years for recovery)             --  methotrexate--d'ced per patient request.             --was off plaquenil due to diarrhea.  daily X 6 more days then off.               13.  Stress related hyperglycemia: Hgb A1c-5.4.  Continue to monitor blood sugars AC/at bedtime.             --  sliding scale insulin for elevated blood sugars. 14.  Hypocalcemia: Add calcium for oral supplementation 15. Bowel incontinence: weaning off plaquenil  -stopped softeners and laxatives  -add probiotic, fiber  -dc rectal tube 16. Fevers:100.3 , 4/8- now running 99  -CBC unremarkable this morning  -dopplers negative  UA without WBC or bact nitrite neg  -IS/OOB- likely atelectasis  - Sepsis protocol prn T>100.5.   -if temps/loose stool continue, consider sending stool to lab     LOS: 2 days A FACE TO [place] E Keishawn Darsey 07/11/2020, 8:02 AM

## 2020-07-11 NOTE — Progress Notes (Signed)
Speech Language Pathology Daily Session Note  Patient Details  Name: Amy Barry MRN: [id number] Date of Birth: 10-09-1970  Today's Date: 07/11/2020 SLP Individual Time: 1300-1330 SLP Individual Time Calculation (min): 30 min  Short Term Goals: Week 1: SLP Short Term Goal 1 (Week 1): Patient will recall at least one activity completed in each of the therapy disciplines (PT, OT, ST) and at least one intervention from nursing with  [place]. SLP Short Term Goal 2 (Week 1): Patient will answer delayed recall questions after listening to short (few sentence paragraph level) text with 80% accuracy and minA. SLP Short Term Goal 3 (Week 1): Patient will summarize after SLP read aloud of 3-4 sentence paragraph with 75% accuracy and modA. SLP Short Term Goal 4 (Week 1): Patient will utilize strategies and environmental cues to orient to time of day, date and recall daily activities completed with minA cues.  Skilled Therapeutic Interventions:   Patient seen with two daughters present for skilled ST session focusing on cognitive function goals. Patient lying in bed and has had back ache and is overall uncomfortable. She also appeared more distracted as well as anxious. Her daughters did confirm that patient has been having a cognitive decline for past couple years maybe more and one daughter mentioning how she gets anxious and tries to think of too many things at once. Patient used phone to find date after SLP cued her to do so. She then started opening up her email and became distracted but did not seem to be reading emails intently. SLP recommended reducing amount of distractions and reducing amount of visual cues/information (having a page on phone with just a few of most used apps, etc). Patient also showed SLP how she had started using the memory notebook. SLP noted that patient had different topics mixed together, such as list of people's names mixed with items she wanted brought from home. SLP recommended  that and plans to continue to work on [employer], such as having a category listed and having patient only writing related items under category. Patient continues to benefit from skilled SLP intervention to maximize cognitive-linguistic function prior to discharge.  Pain Pain Assessment Pain Scale: Faces Pain Score: 7  Faces Pain Scale: Hurts little more Pain Type: Chronic pain;Acute pain Pain Location: Back Pain Orientation: Posterior Pain Descriptors / Indicators: Discomfort;Aching Pain Onset: On-going Pain Intervention(s): Other (Comment) (RN already aware) Multiple Pain Sites: No  Therapy/Group: Individual Therapy  Sonia Baller, MA, CCC-SLP Speech Therapy

## 2020-07-12 DIAGNOSIS — I609 Nontraumatic subarachnoid hemorrhage, unspecified: Secondary | ICD-10-CM | POA: Diagnosis not present

## 2020-07-12 LAB — BASIC METABOLIC PANEL
Anion gap: 8 (ref 5–15)
BUN: 5 mg/dL — ABNORMAL LOW (ref 6–20)
CO2: 23 mmol/L (ref 22–32)
Calcium: 9.1 mg/dL (ref 8.9–10.3)
Chloride: 100 mmol/L (ref 98–111)
Creatinine, Ser: 0.56 mg/dL (ref 0.44–1.00)
GFR, Estimated: >60 mL/min (ref >60)
Glucose, Bld: 105 mg/dL — ABNORMAL HIGH (ref 70–99)
Potassium: 4.3 mmol/L (ref 3.5–5.1)
Sodium: 131 mmol/L — ABNORMAL LOW (ref 135–145)

## 2020-07-12 LAB — GLUCOSE, CAPILLARY
Glucose-Capillary: 107 mg/dL — ABNORMAL HIGH (ref 70–99)
Glucose-Capillary: 100 mg/dL — ABNORMAL HIGH (ref 70–99)
Glucose-Capillary: 88 mg/dL (ref 70–99)
Glucose-Capillary: 111 mg/dL — ABNORMAL HIGH (ref 70–99)

## 2020-07-12 LAB — URINE CULTURE: Culture: <10000 — AB

## 2020-07-12 MED ORDER — POTASSIUM CHLORIDE CRYS ER 20 MEQ PO TBCR
30.0000 meq | EXTENDED_RELEASE_TABLET | Freq: Two times a day (BID) | ORAL | Status: DC
  Administered 2020-07-12 – 2020-07-16 (×8): 30 meq via ORAL
  Filled 2020-07-12 (×8): qty 1

## 2020-07-12 NOTE — Progress Notes (Signed)
Occupational Therapy Session Note  Patient Details  Name: Amy Barry MRN: [id number] Date of Birth: 03-04-1971  Today's Date: 07/12/2020 OT Individual Time: 1345-1445 OT Individual Time Calculation (min): 60 min  and Today's Date: 07/12/2020 OT Missed Time: 15 Minutes Missed Time Reason: Patient fatigue   Short Term Goals: Week 1:  OT Short Term Goal 1 (Week 1): Pt will complete LB dressing at sit<stand level with 1 assist OT Short Term Goal 2 (Week 1): Pt will complete UB dressing while sitting unsupported with no more than Min balance assist OT Short Term Goal 3 (Week 1): Pt will complete toilet transfer with 1 assist and LRAD  Skilled Therapeutic Interventions/Progress Updates:    Pt received supine with her friend present. Lengthy discussion re pain management, energy conservation, and fatigue. Pt requested to wash her hair and after review of MD note, was agreeable to hair wash in the sink with avoidance of 2 staples still intact. Pt completed bed mobility to EOB with mod A for lifting trunk. Pt with no c/o pain at rest but pain increasing as she increased OOB mobility. Min cueing for hand placement on RW. Min A for sit > stand transfer. Pt completed stand pivot transfer to the w/c with CGA overall. She required cueing for safe positioning in w/c- sitting very close to the edge with low awareness. Pt sat reclined in w/c and OT provided assist for hair washing in the sink. Pt then stood and was able to remain standing for almost 15 min while OT brushed out matted parts in the back. Pt reported pain was increasing and requested to return to bed. She was left supine with all needs met. 15 min missed d/t fatigue and pain.   Therapy Documentation Precautions:  Precautions Precautions: Fall Restrictions Weight Bearing Restrictions: No  Therapy/Group: Individual Therapy  Curtis Sites 07/12/2020, 1:01 PM

## 2020-07-12 NOTE — Progress Notes (Signed)
Patient requested not to take  plaquenil claims she will be having loose stools.

## 2020-07-12 NOTE — Progress Notes (Signed)
Physical Therapy Session Note  Patient Details  Name: Amy Barry MRN: [id number] Date of Birth: 1971-03-02  Today's Date: 07/12/2020 PT Individual Time: 1478-2956 PT Individual Time Calculation (min): 52 min   Short Term Goals: Week 1:  PT Short Term Goal 1 (Week 1): Pt will transfer sup to sit w/ log roll and min A PT Short Term Goal 2 (Week 1): Pt will transfer sit to stand and SPT w/ min A PT Short Term Goal 3 (Week 1): Pt will amb 100' w/ RW and CGA.  Skilled Therapeutic Interventions/Progress Updates:     Patient in bed with her husband and RN in the room upon PT arrival. Patient alert and agreeable to PT session. Patient reported 10/10 low back pain with muscle spasms during session, RN made aware, provided pain medicine during session. PT provided repositioning, rest breaks, and distraction as pain interventions throughout session.   Patient undressed in the bed upon PT arrival, reports significant temperature regulation challenges during admission. States that she goes from very hot and having to undress to very cold frequently throughout the day, RN aware.  Therapeutic Activity: Bed Mobility: Patient performed supine to/from sit with min-mod A and increased time in a flat bed with use of bed rail. Provided verbal cues for log roll technique to reduce back pain and spasms with mobility. Patient sat EOB >5 min with CGA progressing to supervision to take medications provided by RN. Patient aspirated with thick liquid medication and reports intermittent aspiration when eating/drinking, will pass on to SLP. Patient donned/doffed a shirt with set-up assist and pants with max A due to back pain. Non-skid socks donned throughout session. Transfers: Patient performed sit to/from stand from the bed and BSC, and stand pivot bed>w/c with min A and increased time. Patient stated that someone had mentioned to her to bring her pelvis forward, caused patient to come to her toes with minor anterior  LOB on first stand, improved with cues to maintain full foot on the floor. Provided verbal cues for breath support for transitional movements to control muscle spasms, shifting her COM over her feet to stand, and positioning to reduced twisting and bending at her back. Patient was continent of bowl and bladder on BSC during session. Required total A for peri-care and lower body clothing management.  Gait Training:  Patient ambulated 156 feet using RW with CGA progressing to min A with fatigue. Ambulated with forward trunk flexion, lateral trunk sway with lateral hip instability, decreased R DF with toe drag, narrow BOS R>L, appears to have decreased R leg length, however, unable to perform formal evaluation during session. Provided verbal cues for erect posture, TA activation for reduced anterior pelvic tilt and improved pain with mobility, heel strike with R foot for improved R toe clearance with poor carryover, increased lateral hip activation in stance, and diaphragmatic breathing with standing rest breaks with muscle spasms.  Patient in bed with her husband at bedside at end of session with breaks locked, bed alarm set, and all needs within reach.    Therapy Documentation Precautions:  Precautions Precautions: Fall Restrictions Weight Bearing Restrictions: No   Therapy/Group: Individual Therapy  Amy Barry PT, DPT  07/12/2020, 12:28 PM

## 2020-07-12 NOTE — Plan of Care (Addendum)
  Problem: RH BOWEL ELIMINATION Goal: RH STG MANAGE BOWEL WITH ASSISTANCE Description: STG Manage Bowel with supervision Assistance. Outcome: Not Progressing;loose stools today x4; due med given   Problem: RH BLADDER ELIMINATION Goal: RH STG MANAGE BLADDER WITH ASSISTANCE Description: STG Manage Bladder With supervision Assistance Outcome: Not Progressing;incontinence at times   Problem: RH PAIN MANAGEMENT Goal: RH STG PAIN MANAGED AT OR BELOW PT'S PAIN GOAL Description: <4 on a 0-10 pain scale. Outcome: Not Progressing; c/o back and leg pain due meds given; tylenol #3 works better than oxycodone   Problem: RH KNOWLEDGE DEFICIT Goal: RH STG INCREASE KNOWLEDGE OF HYPERTENSION Description: Patient will be able to demonstrate knowledge of medication management, BP controls, and dietary management with educational materials and handouts provided by staff during the rehab stay on discharge. Outcome: Not Progressing; forgetful at times

## 2020-07-12 NOTE — Progress Notes (Signed)
Speech Language Pathology Daily Session Note  Patient Details  Name: Amy Barry MRN: [id number] Date of Birth: 09-11-1970  Today's Date: 07/12/2020 SLP Individual Time: 1105-1200 SLP Individual Time Calculation (min): 55 min  Short Term Goals: Week 1: SLP Short Term Goal 1 (Week 1): Patient will recall at least one activity completed in each of the therapy disciplines (PT, OT, ST) and at least one intervention from nursing with  [place]. SLP Short Term Goal 2 (Week 1): Patient will answer delayed recall questions after listening to short (few sentence paragraph level) text with 80% accuracy and minA. SLP Short Term Goal 3 (Week 1): Patient will summarize after SLP read aloud of 3-4 sentence paragraph with 75% accuracy and modA. SLP Short Term Goal 4 (Week 1): Patient will utilize strategies and environmental cues to orient to time of day, date and recall daily activities completed with minA cues.  Skilled Therapeutic Interventions:  Pt was seen for skilled ST targeting cognitive goals.  Pt's husband was at bedside and remained engaged in all treatment activities.  Pt had complaints of back pain and k pad was used for the duration of today's session to manage pain.  SLP provided skilled education regarding organization techniques to maximize use of memory notebook.  Strategies included sectioning off pages of the notebook into categories of similar topics (pt requested categories were appointments, lists, and phone numbers) and labeling sections with [employer] tabs to make locating information more efficient, as well as keeping notebook easily visible and reachable to make using it a more natural daily practice.  SLP also discussed using reminders on pt's phone or tablet to improve recall of daily information.  Pt's back pain progressed to spasm by the end of today's session.  RN was informed and ice was applied in replacement of heat while waiting on pain meds.  Pt was left resting in bed with husband at  bedside.    Pain Pain Assessment Pain Scale: 0-10 Pain Score: 10-Worst pain ever Pain Location: Back Pain Descriptors / Indicators: Tightness;Spasm Pain Intervention(s): Cold applied;Heat applied;RN made aware  Therapy/Group: Individual Therapy  Tavon Magnussen, Selinda Orion 07/12/2020, 12:37 PM

## 2020-07-12 NOTE — Progress Notes (Signed)
PROGRESS NOTE   Subjective/Complaints:  Diffuse back and buttock pain ROS: Patient denies CP, SOB, N/V/D Objective:   DG Chest 2 View  Result Date: 07/11/2020 CLINICAL DATA:  Fever. EXAM: CHEST - 2 VIEW COMPARISON:  09/16/2015 FINDINGS: Normal sized heart. Clear lungs with normal vascularity. Interval lower cervical spine hardware. IMPRESSION: No acute abnormality. Electronically Signed   By: Claudie Revering M.D.   On: 07/11/2020 16:40   VAS [name] LOWER EXTREMITY VENOUS (DVT)  Result Date: 07/10/2020  Lower Venous DVT Study Indications: FUO.  Risk Factors: None identified. Comparison Study: No prior studies. Performing Technologist: Oliver Hum RVT  Examination Guidelines: A complete evaluation includes B-mode imaging, spectral Doppler, color Doppler, and power Doppler as needed of all accessible portions of each vessel. Bilateral testing is considered an integral part of a complete examination. Limited examinations for reoccurring indications may be performed as noted. The reflux portion of the exam is performed with the patient in reverse Trendelenburg.  +---------+---------------+---------+-----------+----------+--------------+ RIGHT    CompressibilityPhasicitySpontaneityPropertiesThrombus Aging +---------+---------------+---------+-----------+----------+--------------+ CFV      Full           Yes      Yes                                 +---------+---------------+---------+-----------+----------+--------------+ SFJ      Full                                                        +---------+---------------+---------+-----------+----------+--------------+ FV Prox  Full                                                        +---------+---------------+---------+-----------+----------+--------------+ FV Mid   Full                                                         +---------+---------------+---------+-----------+----------+--------------+ FV DistalFull                                                        +---------+---------------+---------+-----------+----------+--------------+ PFV      Full                                                        +---------+---------------+---------+-----------+----------+--------------+ POP      Full  Yes      Yes                                 +---------+---------------+---------+-----------+----------+--------------+ PTV      Full                                                        +---------+---------------+---------+-----------+----------+--------------+ PERO     Full                                                        +---------+---------------+---------+-----------+----------+--------------+   +---------+---------------+---------+-----------+----------+--------------+ LEFT     CompressibilityPhasicitySpontaneityPropertiesThrombus Aging +---------+---------------+---------+-----------+----------+--------------+ CFV      Full           Yes      Yes                                 +---------+---------------+---------+-----------+----------+--------------+ SFJ      Full                                                        +---------+---------------+---------+-----------+----------+--------------+ FV Prox  Full                                                        +---------+---------------+---------+-----------+----------+--------------+ FV Mid   Full                                                        +---------+---------------+---------+-----------+----------+--------------+ FV DistalFull                                                        +---------+---------------+---------+-----------+----------+--------------+ PFV      Full                                                         +---------+---------------+---------+-----------+----------+--------------+ POP      Full           Yes      Yes                                 +---------+---------------+---------+-----------+----------+--------------+ PTV  Full                                                        +---------+---------------+---------+-----------+----------+--------------+ PERO     Full                                                        +---------+---------------+---------+-----------+----------+--------------+     Summary: RIGHT: - There is no evidence of deep vein thrombosis in the lower extremity.  - No cystic structure found in the popliteal fossa.  LEFT: - There is no evidence of deep vein thrombosis in the lower extremity.  - No cystic structure found in the popliteal fossa.  *See table(s) above for measurements and observations. Electronically signed by Deitra Mayo MD on 07/10/2020 at 3:41:09 PM.    Final    Recent Labs    07/09/20 1634 07/10/20 0531  WBC 11.3* 9.9  HGB 11.7* 12.1  HCT 34.2* 33.7*  PLT 317 316   Recent Labs    07/10/20 1010 07/10/20 1607 07/11/20 0513  NA 130*  --  128*  K 2.6* 3.2* 3.6  CL 96*  --  98  CO2 24  --  21*  GLUCOSE 102*  --  117*  BUN 8  --  9  CREATININE 0.72  --  0.59  CALCIUM 9.0  --  8.9    Intake/Output Summary (Last 24 hours) at 07/12/2020 0644 Last data filed at 07/12/2020 0440 Gross per 24 hour  Intake 120 ml  Output 550 ml  Net -430 ml        Physical Exam: Vital Signs Blood pressure 133/87, pulse 88, temperature 98.2 F (36.8 C), temperature source Oral, resp. rate 19, height 5\' 4"  (1.626 m), weight 82.9 kg, last menstrual period 12/22/2017, SpO2 100 %.  General: No acute distress Mood and affect are appropriate Heart: Regular rate and rhythm no rubs murmurs or extra sounds Lungs: Clear to auscultation, breathing unlabored, no rales or wheezes Abdomen: Positive bowel sounds, soft nontender to palpation,  nondistended Extremities: No clubbing, cyanosis, or edema  Skin: right frontal scalp clean with staples in incision Neuro: Pt is cognitively appropriate with normal insight, memory, and awareness. Cranial nerves 2-12 are intact. Sensory exam is normal. Reflexes are 2+ in all 4's. Fine motor coordination is intact. No tremors. Motor function is grossly 5/5 in UE. LE: limited d/t pain proximall, 2/5 HF, KE and 4+/5 ADF/PF. Marland Kitchen  Musculoskeletal: had pain at bilateral PSIS, left more than right. No pain with hip ROM + bilat gluteal tenderness  Even with minimal palpation    Assessment/Plan: 1. Functional deficits which require 3+ hours per day of interdisciplinary therapy in a comprehensive inpatient rehab setting.  Physiatrist is providing close team supervision and 24 hour management of active medical problems listed below.  Physiatrist and rehab team continue to assess barriers to discharge/monitor patient progress toward functional and medical goals  Care Tool:  Bathing    Body parts bathed by patient: Right arm,Left arm,Chest,Abdomen,Front perineal area,Right upper leg,Left upper leg,Face   Body parts bathed by helper: Buttocks,Right lower leg,Left lower leg     Bathing assist Assist Level: Maximal  Assistance - Patient 24 - 49%     Upper Body Dressing/Undressing Upper body dressing   What is the patient wearing?: Pull over shirt,Hospital gown only    Upper body assist Assist Level: Minimal Assistance - Patient > 75%    Lower Body Dressing/Undressing Lower body dressing      What is the patient wearing?: Incontinence brief     Lower body assist Assist for lower body dressing: Maximal Assistance - Patient 25 - 49%     Toileting Toileting Toileting Activity did not occur (Clothing management and hygiene only): N/A (no void or bm)  Toileting assist Assist for toileting: Moderate Assistance - Patient 50 - 74%     Transfers Chair/bed transfer  Transfers assist      Chair/bed transfer assist level: Moderate Assistance - Patient 50 - 74%     Locomotion Ambulation   Ambulation assist      Assist level: Minimal Assistance - Patient > 75% Assistive device: Clausen-rolling Max distance: 15   Walk 10 feet activity   Assist     Assist level: Minimal Assistance - Patient > 75% Assistive device: Torr-rolling   Walk 50 feet activity   Assist Walk 50 feet with 2 turns activity did not occur: Refused         Walk 150 feet activity   Assist Walk 150 feet activity did not occur: Refused         Walk 10 feet on uneven surface  activity   Assist Walk 10 feet on uneven surfaces activity did not occur: Safety/medical concerns         Wheelchair     Assist Will patient use wheelchair at discharge?: No Type of Wheelchair: Manual Wheelchair activity did not occur: Refused         Wheelchair 50 feet with 2 turns activity    Assist            Wheelchair 150 feet activity     Assist          Blood pressure 133/87, pulse 88, temperature 98.2 F (36.8 C), temperature source Oral, resp. rate 19, height 5\' 4"  (1.626 m), weight 82.9 kg, last menstrual period 12/22/2017, SpO2 100 %.  Medical Problem List and Plan: 1.  Impaired cognition/mobility and ADLs secondary to [name] within basal cisterns and ventricular system-               -patient may shower with cap until R frontal clips removed in 1-2 d             -ELOS/Goals: 14-18 days- supervision to min A  -Patient is beginning CIR therapies today including PT, OT, and SLP  2.  Antithrombotics: -DVT/anticoagulation:  Mechanical: Sequential compression devices, below knee Bilateral lower extremities             -antiplatelet therapy: N/A 3. Pain Management: Was using NSAIDS tid at home             --will continue Oxycodone as needed  -gabapentin/kpad for myalgias, pain per home regimen, ? FM associated with connective tissues d/o 4. Mood: LCSW to follow  for evaluation and support.                         -antipsychotic agents: N/A 5. Neuropsych: This patient is not fully capable of making decisions on her own behalf. 6. Skin/Wound Care: Routine pressure-relief measures. Should be able to remove vent clips RIgh tfrontal in am  7.  Fluids/Electrolytes/Nutrition:    -hypokalemia: 2.6   -runs of K+ IV today   -recheck 4/9 was 3.6 will recheck 4/11 8. Hyponatremia:  Sodium--127 4/7--> 130 4/8--> 128 4/9- has stayed within this range cont to monitor  -serum osmolality 274             --continue 1500 cc FR  .             -continue salt tabs 9.  Seizure prophylaxis: Continue Keppra twice daily.   10. HTN/Tachycardia: Monitor blood pressures 3 times daily.             --Continue Nimotop every 4 hours to prevent vasospasms.             --Continue metoprolol and amlodipine                --lasix stopped d/t hyponatremia--continue to hold. 11.  Recurrent hypokalemia:   Kdur for supplementation daily.  -labs pending today 12.  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 (Had intermittent weakness with RLE with foot drop progressing to left sided weakness--needed therapy X 2 years for recovery)             --  methotrexate--d'ced per patient request.             --was off plaquenil due to diarrhea.  daily X 6 more days then off.              This may cause Low grade temp 13.  Stress related hyperglycemia: Hgb A1c-5.4.  Continue to monitor blood sugars AC/at bedtime.             --  sliding scale insulin for elevated blood sugars. 14.  Hypocalcemia: Add calcium for oral supplementation 15. Bowel incontinence: weaning off plaquenil  -stopped softeners and laxatives  -add probiotic, fiber  -dc rectal tube 16. Fevers:99-100.3- no symptoms of infection , recheck CBC in am   -CBC unremarkable this morning  -dopplers negative  UA without WBC or bact nitrite neg  -IS/OOB- likely atelectasis  - Sepsis protocol prn T>100.5.   -if temps/loose  stool continue, consider sending stool to lab CXR neg 4/9    LOS: 3 days A FACE TO [place] E Ezrah Dembeck 07/12/2020, 6:44 AM

## 2020-07-13 DIAGNOSIS — I609 Nontraumatic subarachnoid hemorrhage, unspecified: Secondary | ICD-10-CM | POA: Diagnosis not present

## 2020-07-13 DIAGNOSIS — E871 Hypo-osmolality and hyponatremia: Secondary | ICD-10-CM | POA: Diagnosis not present

## 2020-07-13 DIAGNOSIS — I10 Essential (primary) hypertension: Secondary | ICD-10-CM | POA: Diagnosis not present

## 2020-07-13 DIAGNOSIS — E876 Hypokalemia: Secondary | ICD-10-CM | POA: Diagnosis not present

## 2020-07-13 LAB — BASIC METABOLIC PANEL
Anion gap: 7 (ref 5–15)
BUN: 6 mg/dL (ref 6–20)
CO2: 21 mmol/L — ABNORMAL LOW (ref 22–32)
Calcium: 9.3 mg/dL (ref 8.9–10.3)
Chloride: 102 mmol/L (ref 98–111)
Creatinine, Ser: 0.63 mg/dL (ref 0.44–1.00)
GFR, Estimated: >60 mL/min (ref >60)
Glucose, Bld: 110 mg/dL — ABNORMAL HIGH (ref 70–99)
Potassium: 4.1 mmol/L (ref 3.5–5.1)
Sodium: 130 mmol/L — ABNORMAL LOW (ref 135–145)

## 2020-07-13 LAB — GLUCOSE, CAPILLARY
Glucose-Capillary: 103 mg/dL — ABNORMAL HIGH (ref 70–99)
Glucose-Capillary: 95 mg/dL (ref 70–99)
Glucose-Capillary: 101 mg/dL — ABNORMAL HIGH (ref 70–99)
Glucose-Capillary: 123 mg/dL — ABNORMAL HIGH (ref 70–99)

## 2020-07-13 MED ORDER — TRAZODONE HCL 50 MG PO TABS
50.0000 mg | ORAL_TABLET | Freq: Every day | ORAL | Status: DC
  Administered 2020-07-13 – 2020-07-23 (×11): 50 mg via ORAL
  Filled 2020-07-13 (×11): qty 1

## 2020-07-13 MED ORDER — CHOLESTYRAMINE 4 G PO PACK
4.0000 g | PACK | Freq: Three times a day (TID) | ORAL | Status: DC
  Administered 2020-07-13 – 2020-07-24 (×32): 4 g via ORAL
  Filled 2020-07-13 (×34): qty 1

## 2020-07-13 NOTE — Care Management (Signed)
[facility] Individual Statement of Services  Patient Name:  Amy Barry  Date:  07/13/2020  Welcome to the [facility].  Our goal is to provide you with an individualized program based on your diagnosis and situation, designed to meet your specific needs.  With this comprehensive rehabilitation program, you will be expected to participate in at least 3 hours of rehabilitation therapies Monday-Friday, with modified therapy programming on the weekends.  Your rehabilitation program will include the following services:  Physical Therapy (PT), Occupational Therapy (OT), 24 hour per day rehabilitation nursing, Therapeutic Recreaction (TR), Psychology, Neuropsychology, Care Coordinator, Rehabilitation Medicine, Nutrition Services, Pharmacy Services and Other  Weekly team conferences will be held on Tuesdays to discuss your progress.  Your Inpatient Rehabilitation Care Coordinator will talk with you frequently to get your input and to update you on team discussions.  Team conferences with you and your family in attendance may also be held.  Expected length of stay: 1-2 weeks    Overall anticipated outcome: Supervision  Depending on your progress and recovery, your program may change. Your Inpatient Rehabilitation Care Coordinator will coordinate services and will keep you informed of any changes. Your Inpatient Rehabilitation Care Coordinator's name and contact numbers are listed  below.  The following services may also be recommended but are not provided by the [facility] will be made to provide these services after discharge if needed.  Arrangements include referral to agencies that provide these services.  Your insurance has been verified to be:  [employer]  Your primary doctor is: Donald Prose    Pertinent information will be shared with your doctor and your insurance company.  Inpatient Rehabilitation Care Coordinator:  Cathleen Corti [phone] or ([phone]  Information discussed with and copy given to patient by: Rana Snare, 07/13/2020, 11:50 AM

## 2020-07-13 NOTE — Progress Notes (Signed)
Pt complaining of all over pain 10/10. She describes the pain as generalized. She is sitting up in chair with heating pad on. Pt given lidocaine patch to back of neck. Pt also given prn oxy 10mg  and robaxin. Pt's temp is 98.4 and CBG 101

## 2020-07-13 NOTE — Progress Notes (Signed)
Physical Therapy Session Note  Patient Details  Name: Amy Barry MRN: [id number] Date of Birth: April 23, 1970  Today's Date: 07/13/2020 PT Individual Time: 0830-0900 PT Individual Time Calculation (min): 30 min   Short Term Goals: Week 1:  PT Short Term Goal 1 (Week 1): Pt will transfer sup to sit w/ log roll and min A PT Short Term Goal 2 (Week 1): Pt will transfer sit to stand and SPT w/ min A PT Short Term Goal 3 (Week 1): Pt will amb 100' w/ RW and CGA.  Skilled Therapeutic Interventions/Progress Updates:    Pt received seated in bed with husband present, agreeable to PT session. No complaints of pain. Pt requesting to use the bathroom and perform LB dressing. Semi-reclined in bed to sitting EOB with min A needed for some trunk control. Sit to stand with min A to RW. Ambulation into bathroom with RW and min A for balance, RLE circumduction and decreased knee flexion noted. Toilet transfer with min A and use of RW and/or grab bars. Pt able to continently void urine while seated on toilet, setup A for pericare. Assisted pt with donning Depends and pants while seated on toilet. Pt able to pull pants up over hips in standing with min A for balance. Standing balance at sink x 5 min while perform oral hygiene and washing face with intermittent UE support and CGA. Pt left seated in w/c in room with needs in reach, quick release belt and chair alarm in place, husband present at end of session.  Therapy Documentation Precautions:  Precautions Precautions: Fall Restrictions Weight Bearing Restrictions: No    Therapy/Group: Individual Therapy   Excell Seltzer, PT, DPT, CSRS  07/13/2020, 12:26 PM

## 2020-07-13 NOTE — Progress Notes (Signed)
   07/13/20 0940  Clinical Encounter Type  Visited With Patient and family together  Visit Type Initial  Referral From Nurse  Consult/Referral To [facility] responded to the AD consult for Amy Barry. AD literature was given to Amy Barry and Family and informed after you and your husband discuss the AD and paperwork filled out, you can have the nurse page the Chaplain and we can come review the form and then when ready have it notarized and have 2 people witness. They agreed.  Chaplain Kaleb Linquist [place] [phone]

## 2020-07-13 NOTE — Progress Notes (Signed)
Occupational Therapy Session Note  Patient Details  Name: Amy Barry MRN: [id number] Date of Birth: 11-12-1970  Today's Date: 07/13/2020  Session 1 OT Individual Time: 1000-1040 OT Individual Time Calculation (min): 40 min   Session 2 OT Individual Time: 1140-1210 OT Individual Time Calculation (min): 30 min    Short Term Goals: Week 1:  OT Short Term Goal 1 (Week 1): Pt will complete LB dressing at sit<stand level with 1 assist OT Short Term Goal 2 (Week 1): Pt will complete UB dressing while sitting unsupported with no more than Min balance assist OT Short Term Goal 3 (Week 1): Pt will complete toilet transfer with 1 assist and LRAD  Skilled Therapeutic Interventions/Progress Updates:    Session 1 Pt greeted seated in recliner and agreeable to OT treatment session. Pt needed increased time and verbal cues for hand placement for sit<>stand 2/2 R L spasms and pain in buttocks. Pt ambulated to the sink with RW and min A. OT educated on RW placement at the sink. Pt tolerated standing for 15 minutes to wash upper and lower body. Pt needed overall close supervision with intermittent CGA for balance. Pt needed set-up for UB ADLs, then needed max A for LB dressing 2/2 pain and difficulty reaching forward. Pt left handoff to SLP for next therapy session.   Session 2 Pt greeted in bathroom. Pt with successful small BM. Pt able to stand with min A, then complete peri-care with set-up. Pt reported feeling like she had to go more after wiping, and returned to sitting for more BM. Pt finished and completed peri-care. Pt needed verbal cues to recall RW position at the sink to wash her hands. She then ambulated 15 feet in hallway with min A and focus on decrease of hip hike and hip twist to advance LEs. Pt returned to bed with min A to lift LEs back in bed. Pt left semi-reclined in bed with spouse and mother present, bed alarm on, and needs met.   Therapy Documentation Precautions:   Precautions Precautions: Fall Restrictions Weight Bearing Restrictions: No Pain: Pain Assessment Pain Scale: 0-10 Pain Score: 7  Pain Type: Chronic pain Pain Location: Back Pain Descriptors / Indicators: Aching Pain Frequency: Intermittent Pain Intervention(s): Repositioned   Therapy/Group: Individual Therapy  Valma Cava 07/13/2020, 12:47 PM

## 2020-07-13 NOTE — Progress Notes (Signed)
PROGRESS NOTE   Subjective/Complaints:  Pain seems better. Having right leg>left leg spasms, headaches just dull. Husband concerned that she wasn't gotten up to bathroom and that bedpan was used instead. She feels like she's turning the corner. Stools still liquidy  ROS: Patient denies fever, rash, sore throat, blurred vision, nausea, vomiting,  cough, shortness of breath or chest pain,   or mood change.   Objective:   DG Chest 2 View  Result Date: 07/11/2020 CLINICAL DATA:  Fever. EXAM: CHEST - 2 VIEW COMPARISON:  09/16/2015 FINDINGS: Normal sized heart. Clear lungs with normal vascularity. Interval lower cervical spine hardware. IMPRESSION: No acute abnormality. Electronically Signed   By: Claudie Revering M.D.   On: 07/11/2020 16:40   No results for input(s): WBC, HGB, HCT, PLT in the last 72 hours. Recent Labs    07/12/20 0551 07/13/20 0621  NA 131* 130*  K 4.3 4.1  CL 100 102  CO2 23 21*  GLUCOSE 105* 110*  BUN 5* 6  CREATININE 0.56 0.63  CALCIUM 9.1 9.3    Intake/Output Summary (Last 24 hours) at 07/13/2020 1054 Last data filed at 07/13/2020 0349 Gross per 24 hour  Intake 360 ml  Output --  Net 360 ml        Physical Exam: Vital Signs Blood pressure (!) 150/75, pulse 83, temperature 98.7 F (37.1 C), temperature source Oral, resp. rate 20, height 5\' 4"  (1.626 m), weight 82.9 kg, last menstrual period 12/22/2017, SpO2 100 %.  Constitutional: No distress . Vital signs reviewed. HEENT: EOMI, oral membranes moist Neck: supple Cardiovascular: RRR without murmur. No JVD    Respiratory/Chest: CTA Bilaterally without wheezes or rales. Normal effort    GI/Abdomen: BS +, non-tender, non-distended Ext: no clubbing, cyanosis, or edema Psych: pleasant and cooperative Skin: right frontal scalp clean with staples and sutures Neuro: Pt is cognitively appropriate with normal insight, memory, and awareness. Cranial nerves  2-12 are intact. Sensory exam is normal. Reflexes are 2+ in all 4's. Fine motor coordination is intact. No tremors. Motor function is grossly 5/5 in UE. LE: limited d/t pain proximall, 2/5 HF, KE and 4+/5 ADF/PF. Motor and sensory exam stable  Musculoskeletal: + bilat gluteal and thigh tenderness.   Assessment/Plan: 1. Functional deficits which require 3+ hours per day of interdisciplinary therapy in a comprehensive inpatient rehab setting.  Physiatrist is providing close team supervision and 24 hour management of active medical problems listed below.  Physiatrist and rehab team continue to assess barriers to discharge/monitor patient progress toward functional and medical goals  Care Tool:  Bathing    Body parts bathed by patient: Right arm,Left arm,Chest,Abdomen,Front perineal area,Right upper leg,Left upper leg,Face   Body parts bathed by helper: Buttocks,Right lower leg,Left lower leg     Bathing assist Assist Level: Maximal Assistance - Patient 24 - 49%     Upper Body Dressing/Undressing Upper body dressing   What is the patient wearing?: Pull over shirt,Hospital gown only    Upper body assist Assist Level: Minimal Assistance - Patient > 75%    Lower Body Dressing/Undressing Lower body dressing      What is the patient wearing?: Incontinence brief  Lower body assist Assist for lower body dressing: Maximal Assistance - Patient 25 - 49%     Toileting Toileting Toileting Activity did not occur (Clothing management and hygiene only): N/A (no void or bm)  Toileting assist Assist for toileting: Moderate Assistance - Patient 50 - 74%     Transfers Chair/bed transfer  Transfers assist     Chair/bed transfer assist level: Moderate Assistance - Patient 50 - 74%     Locomotion Ambulation   Ambulation assist      Assist level: Minimal Assistance - Patient > 75% Assistive device: Neils-rolling Max distance: 15   Walk 10 feet activity   Assist      Assist level: Minimal Assistance - Patient > 75% Assistive device: Edgren-rolling   Walk 50 feet activity   Assist Walk 50 feet with 2 turns activity did not occur: Refused         Walk 150 feet activity   Assist Walk 150 feet activity did not occur: Refused         Walk 10 feet on uneven surface  activity   Assist Walk 10 feet on uneven surfaces activity did not occur: Safety/medical concerns         Wheelchair     Assist Will patient use wheelchair at discharge?: No Type of Wheelchair: Manual Wheelchair activity did not occur: Refused         Wheelchair 50 feet with 2 turns activity    Assist            Wheelchair 150 feet activity     Assist          Blood pressure (!) 150/75, pulse 83, temperature 98.7 F (37.1 C), temperature source Oral, resp. rate 20, height 5\' 4"  (1.626 m), weight 82.9 kg, last menstrual period 12/22/2017, SpO2 100 %.  Medical Problem List and Plan: 1.  Impaired cognition/mobility and ADLs secondary to [facility] within basal cisterns and ventricular system-               -patient may shower with cap until R frontal clips removed in 1-2 d             -ELOS/Goals: 14-18 days- supervision to min A  -Continue CIR therapies including PT, OT, and SLP   2.  Antithrombotics: -DVT/anticoagulation:  Mechanical: Sequential compression devices, below knee Bilateral lower extremities             -antiplatelet therapy: N/A 3. Pain Management: Was using NSAIDS tid at home             --will continue Oxycodone as needed  -gabapentin/kpad for myalgias, pain per home regimen   -on scheduled robaxin 750mg  qid  4/11 pain better per pt 4. Mood: LCSW to follow for evaluation and support.                         -antipsychotic agents: N/A 5. Neuropsych: This patient is not fully capable of making decisions on her own behalf. 6. Skin/Wound Care: Routine pressure-relief measures. Should be able to remove vent clips RIgh tfrontal in am   7. Fluids/Electrolytes/Nutrition:    -hypokalemia: 4.1 4/11 continue supplement 8. Hyponatremia:  sodium 130 4/11             --continue 1500 cc FR  .             -continue salt tabs  -recheck labs midweek 9.  Seizure prophylaxis: Continue Keppra twice daily.  10. HTN/Tachycardia: Monitor blood pressures 3 times daily.             --Continue Nimotop every 4 hours to prevent vasospasms.             --Continue metoprolol and amlodipine                --lasix held d/t hyponatremia--continue to hold. 11.  Recurrent hypokalemia:   Kdur for supplementation daily.  -labs pending today 12.  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 (Had intermittent weakness with RLE with foot drop progressing to left sided weakness--needed therapy X 2 years for recovery)             --  methotrexate--d'ced per patient request.             --was off plaquenil due to diarrhea.                 13.  Stress related hyperglycemia: Hgb A1c-5.4.  Continue to monitor blood sugars AC/at bedtime.             --  sliding scale insulin for elevated blood sugars. 14.  Hypocalcemia: Add calcium for oral supplementation 15. Bowel incontinence: weaning off plaquenil--ends thursday  -stopped softeners and laxatives  -added probiotic, fiber  -up to toilet to empty  -add questran to help with stool consistency 16. Fevers:100.4 yesterday 13:11  -CXR negative 4/9.  -might be related to CT disorder  -continue to monitor    LOS: 4 days A FACE TO FACE EVALUATION WAS PERFORMED  Meredith Staggers 07/13/2020, 10:54 AM

## 2020-07-13 NOTE — Progress Notes (Signed)
Sutures and Staples to head removed as ordered. Incisions are clean and open to air.

## 2020-07-13 NOTE — Progress Notes (Signed)
Speech Language Pathology Daily Session Note  Patient Details  Name: OMIE FERGER MRN: [id number] Date of Birth: 07/31/1970  Today's Date: 07/13/2020 SLP Individual Time: 1040-1100 SLP Individual Time Calculation (min): 20 min  Short Term Goals: Week 1: SLP Short Term Goal 1 (Week 1): Patient will recall at least one activity completed in each of the therapy disciplines (PT, OT, ST) and at least one intervention from nursing with  [place]. SLP Short Term Goal 2 (Week 1): Patient will answer delayed recall questions after listening to short (few sentence paragraph level) text with 80% accuracy and minA. SLP Short Term Goal 3 (Week 1): Patient will summarize after SLP read aloud of 3-4 sentence paragraph with 75% accuracy and modA. SLP Short Term Goal 4 (Week 1): Patient will utilize strategies and environmental cues to orient to time of day, date and recall daily activities completed with minA cues.  Skilled Therapeutic Interventions: Skilled SLP intervention focused on cognition. Pt finishing up with occupational therapy when SLP entered room. She completed simple category sorting task with holidays and months. She required moderate assistance for understanding of task initially but cues were faded as task continued. Increased time needed for processing task steps. She recalled having a memory notebook and was able to recall 3 tasks completed in OT session. Pt left seated upright in bed with husband present. Cont with therapy per plan of care.   Pain Pain Assessment Pain Scale: Faces Faces Pain Scale: Hurts a little bit  Therapy/Group: Individual Therapy  Darrol Poke Chaim Gatley 07/13/2020, 2:22 PM

## 2020-07-13 NOTE — Progress Notes (Signed)
Speech Language Pathology Daily Session Note  Patient Details  Name: Amy Barry MRN: [id number] Date of Birth: September 03, 1970  Today's Date: 07/13/2020 SLP Individual Time: 0730-0830 SLP Individual Time Calculation (min): 60 min  Short Term Goals: Week 1: SLP Short Term Goal 1 (Week 1): Patient will recall at least one activity completed in each of the therapy disciplines (PT, OT, ST) and at least one intervention from nursing with  [name]. SLP Short Term Goal 2 (Week 1): Patient will answer delayed recall questions after listening to short (few sentence paragraph level) text with 80% accuracy and minA. SLP Short Term Goal 3 (Week 1): Patient will summarize after SLP read aloud of 3-4 sentence paragraph with 75% accuracy and modA. SLP Short Term Goal 4 (Week 1): Patient will utilize strategies and environmental cues to orient to time of day, date and recall daily activities completed with minA cues.  Skilled Therapeutic Interventions: Skilled treatment session focused on cognitive goals. SLP facilitated session by providing Mod A verbal cues for utilization of organizational tools to maximize use of a memory notebook. Patient reported that the notebook was not efficient for use at this time. Therefore, the patient and SLP developed a memory folder to maximize ease of use. SLP also initiated a calendar for recall of today's dates and important dates regarding current hospitalization as well a memory aid to record important numbers. Patient was able to transfer important numbers onto the visual aid with Min verbal cues to self-monitor and correct errors.  Patient reported that she feels these visual aids will maximize ease of use and recall. SLP will continue to monitor. Patient left upright in bed with alarm on and all needs within reach. Continue with current plan of care.      Pain Pain Assessment Pain Scale: 0-10 Pain Score: 7  Pain Type: Chronic pain Pain Location: Back Pain Descriptors /  Indicators: Aching Pain Frequency: Intermittent Pain Intervention(s): Medication (See eMAR)  Therapy/Group: Individual Therapy  Amy Barry 07/13/2020, 12:27 PM

## 2020-07-14 DIAGNOSIS — I609 Nontraumatic subarachnoid hemorrhage, unspecified: Secondary | ICD-10-CM | POA: Diagnosis not present

## 2020-07-14 DIAGNOSIS — E876 Hypokalemia: Secondary | ICD-10-CM | POA: Diagnosis not present

## 2020-07-14 DIAGNOSIS — I10 Essential (primary) hypertension: Secondary | ICD-10-CM | POA: Diagnosis not present

## 2020-07-14 DIAGNOSIS — E871 Hypo-osmolality and hyponatremia: Secondary | ICD-10-CM | POA: Diagnosis not present

## 2020-07-14 LAB — GLUCOSE, CAPILLARY
Glucose-Capillary: 111 mg/dL — ABNORMAL HIGH (ref 70–99)
Glucose-Capillary: 97 mg/dL (ref 70–99)
Glucose-Capillary: 101 mg/dL — ABNORMAL HIGH (ref 70–99)
Glucose-Capillary: 132 mg/dL — ABNORMAL HIGH (ref 70–99)

## 2020-07-14 NOTE — Progress Notes (Signed)
PROGRESS NOTE   Subjective/Complaints: Anxious about dog who has lymphoma and family's planning on euthanizing Friday. Would like a grounds pass to see him outside.   ROS: Patient denies fever, rash, sore throat, blurred vision, nausea, vomiting, diarrhea, cough, shortness of breath or chest pain, has dull headache .   Objective:   No results found. No results for input(s): WBC, HGB, HCT, PLT in the last 72 hours. Recent Labs    07/12/20 0551 07/13/20 0621  NA 131* 130*  K 4.3 4.1  CL 100 102  CO2 23 21*  GLUCOSE 105* 110*  BUN 5* 6  CREATININE 0.56 0.63  CALCIUM 9.1 9.3    Intake/Output Summary (Last 24 hours) at 07/14/2020 1115 Last data filed at 07/14/2020 0448 Gross per 24 hour  Intake 800 ml  Output --  Net 800 ml        Physical Exam: Vital Signs Blood pressure 119/68, pulse 86, temperature 98.7 F (37.1 C), temperature source Oral, resp. rate 16, height 5\' 4"  (1.626 m), weight 82.9 kg, last menstrual period 12/22/2017, SpO2 100 %.  Constitutional: No distress . Vital signs reviewed. HEENT: EOMI, oral membranes moist Neck: supple Cardiovascular: RRR without murmur. No JVD    Respiratory/Chest: CTA Bilaterally without wheezes or rales. Normal effort    GI/Abdomen: BS +, non-tender, non-distended Ext: no clubbing, cyanosis, or edema Psych: pleasant and cooperative, sl anxious today Skin: right frontal scalp wounds clean and intact Neuro: Pt is cognitively appropriate with normal insight, memory, and awareness. Cranial nerves 2-12 are intact. Sensory exam is normal. Reflexes are 2+ in all 4's. Fine motor coordination is intact. No tremors. Motor function is grossly 5/5 in UE. LE: limited d/t pain proximall, 2/5 HF, KE and 4+/5 ADF/PF. Motor and sensory exam stable  Musculoskeletal: +thight tenderness   Assessment/Plan: 1. Functional deficits which require 3+ hours per day of interdisciplinary therapy in  a comprehensive inpatient rehab setting.  Physiatrist is providing close team supervision and 24 hour management of active medical problems listed below.  Physiatrist and rehab team continue to assess barriers to discharge/monitor patient progress toward functional and medical goals  Care Tool:  Bathing    Body parts bathed by patient: Right arm,Left arm,Chest,Abdomen,Front perineal area,Right upper leg,Left upper leg,Face   Body parts bathed by helper: Buttocks,Right lower leg,Left lower leg     Bathing assist Assist Level: Maximal Assistance - Patient 24 - 49%     Upper Body Dressing/Undressing Upper body dressing   What is the patient wearing?: Pull over shirt,Hospital gown only    Upper body assist Assist Level: Minimal Assistance - Patient > 75%    Lower Body Dressing/Undressing Lower body dressing      What is the patient wearing?: Incontinence brief     Lower body assist Assist for lower body dressing: Maximal Assistance - Patient 25 - 49%     Toileting Toileting Toileting Activity did not occur (Clothing management and hygiene only): N/A (no void or bm)  Toileting assist Assist for toileting: Moderate Assistance - Patient 50 - 74%     Transfers Chair/bed transfer  Transfers assist     Chair/bed transfer assist level: Minimal Assistance -  Patient > 75%     Locomotion Ambulation   Ambulation assist      Assist level: Minimal Assistance - Patient > 75% Assistive device: Monica-rolling Max distance: 15   Walk 10 feet activity   Assist     Assist level: Minimal Assistance - Patient > 75% Assistive device: Mccanless-rolling   Walk 50 feet activity   Assist Walk 50 feet with 2 turns activity did not occur: Refused         Walk 150 feet activity   Assist Walk 150 feet activity did not occur: Refused         Walk 10 feet on uneven surface  activity   Assist Walk 10 feet on uneven surfaces activity did not occur: Safety/medical  concerns         Wheelchair     Assist Will patient use wheelchair at discharge?: No Type of Wheelchair: Manual Wheelchair activity did not occur: Refused         Wheelchair 50 feet with 2 turns activity    Assist            Wheelchair 150 feet activity     Assist          Blood pressure 119/68, pulse 86, temperature 98.7 F (37.1 C), temperature source Oral, resp. rate 16, height 5\' 4"  (1.626 m), weight 82.9 kg, last menstrual period 12/22/2017, SpO2 100 %.  Medical Problem List and Plan: 1.  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  Antithrombotics: -DVT/anticoagulation:  Mechanical: Sequential compression devices, below knee Bilateral lower extremities             -antiplatelet therapy: N/A 3. Pain Management: Was using NSAIDS tid at home             --will continue Oxycodone as needed  -gabapentin/kpad for myalgias, pain per home regimen   -on scheduled robaxin 750mg  qid  4/12 pain better per pt 4. Mood: LCSW to follow for evaluation and support.                         -antipsychotic agents: N/A 5. Neuropsych: This patient is not fully capable of making decisions on her own behalf. 6. Skin/Wound Care: Routine pressure-relief measures. Should be able to remove vent clips RIgh tfrontal in am  7. Fluids/Electrolytes/Nutrition:    -hypokalemia: 4.1 4/11 continue supplement 8. Hyponatremia:  sodium 130 4/11             --continue 1500 cc FR              -continue salt tabs  -recheck labs Thursday 9.  Seizure prophylaxis: Continue Keppra twice daily.   10. HTN/Tachycardia: Monitor blood pressures 3 times daily.             --Continue Nimotop every 4 hours to prevent vasospasms.             --Continue metoprolol  and amlodipine                --lasix held d/t hyponatremia--continue to hold.  -4/12 bp controlled 11.  Recurrent hypokalemia:  Kdur for supplementation daily.  -K+ 4.1 4/11 12.  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 (Had intermittent weakness with RLE with foot drop progressing to left sided weakness--needed therapy X 2 years for recovery)             --  methotrexate--d'ced per patient request.             --was off plaquenil due to diarrhea.                 13.  Stress related hyperglycemia: Hgb A1c-5.4.  Continue to monitor blood sugars AC/at bedtime.             --  sliding scale insulin for elevated blood sugars. 14.  Hypocalcemia: Add calcium for oral supplementation 15. Bowel incontinence: weaning off plaquenil--ends thursday  -stopped softeners and laxatives  -added probiotic, fiber  -up to toilet to empty  -added questran to help with stool consistency  -4/12 pt reports stools are more formed 16. Fevers: arebrile last 24+ hr  -CXR negative 4/9.  -might be related to CT disorder  -continue to monitor    LOS: 5 days A FACE TO FACE EVALUATION WAS PERFORMED  Meredith Staggers 07/14/2020, 11:15 AM

## 2020-07-14 NOTE — Progress Notes (Signed)
Speech Language Pathology Daily Session Note  Patient Details  Name: Amy Barry MRN: [id number] Date of Birth: 28-May-1970  Today's Date: 07/14/2020 SLP Individual Time: 1224-8250 SLP Individual Time Calculation (min): 55 min  Short Term Goals: Week 1: SLP Short Term Goal 1 (Week 1): Patient will recall at least one activity completed in each of the therapy disciplines (PT, OT, ST) and at least one intervention from nursing with  [place]. SLP Short Term Goal 2 (Week 1): Patient will answer delayed recall questions after listening to short (few sentence paragraph level) text with 80% accuracy and minA. SLP Short Term Goal 3 (Week 1): Patient will summarize after SLP read aloud of 3-4 sentence paragraph with 75% accuracy and modA. SLP Short Term Goal 4 (Week 1): Patient will utilize strategies and environmental cues to orient to time of day, date and recall daily activities completed with minA cues.  Skilled Therapeutic Interventions: Skilled treatment session focused on cognitive goals. Upon arrival, patient was awake in bed with her breakfast next to her. She reported she did not know her breakfast was there because she was unable to see it without her glasses on. Patient initially unable to locate her glasses but with extra time, glasses were located in blankets. Patient and SLP generated a plan to keep glasses in the case and velcro the case to the side of her bed to help with locating them (unable to fit in pocket on side of bed rails). Patient did not utilize her memory notebook yesterday and reported it was because she was unable to locate it. Discussed the importance of leaving the notebook in the same place to help with locating it. Decided on putting it in the left bed rail pocket. Patient located specific areas within the notebook with extra time and recorded questions for the doctor appropriately in the notebook with extra time. Also wrote information down of pertinent information on white  board in her room that she utilized with Min verbal cues. Patient left upright in bed with alarm on and all needs within reach. Continue with current plan of care.     Pain No/Denies Pain   Therapy/Group: Individual Therapy  [name] 07/14/2020, 10:00 AM

## 2020-07-14 NOTE — Progress Notes (Signed)
Physical Therapy Session Note  Patient Details  Name: Amy Barry MRN: [id number] Date of Birth: 1970-08-27  Today's Date: 07/14/2020 PT Individual Time: 1400-1500 PT Individual Time Calculation (min): 60 min   Short Term Goals: Week 1:  PT Short Term Goal 1 (Week 1): Pt will transfer sup to sit w/ log roll and min A PT Short Term Goal 2 (Week 1): Pt will transfer sit to stand and SPT w/ min A PT Short Term Goal 3 (Week 1): Pt will amb 100' w/ RW and CGA.  Skilled Therapeutic Interventions/Progress Updates:    Patient received sitting up in bed, family at bedside, RN providing rx. She was agreeable to PT. She reported 3/10 pain grossly, premedicated. PT providing rest breaks, distractions and repositioning to assist with pain management. She was able to come sit edge of bed with MinA and transfer to wc via stand step pivot transfer with MinA, HHA. Typical gait pattern observed, no abnormal foot clearance or ataxic gait observed. PT transporting patient in wc to therapy gym for time management and energy conservation. She appeared to be trembling in large amplitude, reporting to be cold. PT providing blanket for comfort. Patient ambulated 83ft with RW and CGA/MinA. Abnormal and inconsistent gait pattern observed: R hip hiking + circumduction with excessive foot clearance. Inconsistent lateral hip instability laterally and A/P. B foot drop also inconsistently observed throughout gait bout. Patient reporting significant history of falling and injurious falls, but was not able to explain circumstances of falls. Attempting gait with no AD, MinA HHA x57ft with similar gait pattern as noted above, except minimized lateral hip instability. Patient reporting pain and "spasms" in sciatic nerve distribution B. Patient reports "both of my butt cheeks get cold and then I get these sharp pains down the back of my legs." PT providing patient with diagram of sciatic nerve distribution and educating patient on safe,  gentle stretches and nerve glides to assist with minimizing pain. Patient ambulating additional 54ft with RW and CGA/MinA. R knee "softening" but no buckling noted throughout gait bout as well as gait pattern noted above. Patient returning to room, requesting to return to bed for comfort. Stand step pivot transfer with CGA, HHA and no abnormal gait observed. Patient able to return supine with supervision. Family at bedside, call light within reach.   Therapy Documentation Precautions:  Precautions Precautions: Fall Restrictions Weight Bearing Restrictions: No    Therapy/Group: Individual Therapy  Karoline Caldwell, PT, DPT, CBIS  07/14/2020, 7:42 AM

## 2020-07-14 NOTE — Progress Notes (Signed)
Occupational Therapy Session Note  Patient Details  Name: Amy Barry MRN: [id number] Date of Birth: 02/21/1971  Today's Date: 07/14/2020 OT Individual Time: 8883-5844 OT Individual Time Calculation (min): 45 min    Short Term Goals: Week 1:  OT Short Term Goal 1 (Week 1): Pt will complete LB dressing at sit<stand level with 1 assist OT Short Term Goal 2 (Week 1): Pt will complete UB dressing while sitting unsupported with no more than Min balance assist OT Short Term Goal 3 (Week 1): Pt will complete toilet transfer with 1 assist and LRAD  Skilled Therapeutic Interventions/Progress Updates:    Pt greeted semi-reclined in bed and reported need to urinate. Pt completed bed mobility with supervision. Pt ambulated to the bathroom w/ RW and min A. Pt managed clothing and voided bladder with CGA. Pt then ambulated to get into shower in similar fashion w/ verbal cues for RW positioning. Bathing completed sit<>stand with min A for lower legs. Worked on dressing from wc at the sink with min A. Pt left seated in wc with nursing administering meds and daughter present to help with brushing out hair. Pt left in care of nursing and daughter.  Therapy Documentation Precautions:  Precautions Precautions: Fall Restrictions Weight Bearing Restrictions: No Pain: Denies pain  Therapy/Group: Individual Therapy  Valma Cava 07/14/2020, 11:04 AM

## 2020-07-14 NOTE — Patient Care Conference (Signed)
Inpatient RehabilitationTeam Conference and Plan of Care Update Date: 07/14/2020   Time: 10:38 AM    Patient Name: Amy Barry      Medical Record Number: [id number]  Date of Birth: [birth year] Sex: Female         Room/Bed: 4M11C/4M11C-01 Payor Info: Payor: Holland Falling / Plan: [place] PREFERRED / Product Type: *No Product type* /    Admit Date/Time:  07/09/2020  3:43 PM  Primary Diagnosis:  Subarachnoid bleed [facility])  Hospital Problems: Principal Problem:   Subarachnoid bleed [facility]) Active Problems:   Essential hypertension   Gait abnormality    Expected Discharge Date: Expected Discharge Date: 07/24/20  Team Members Present: Physician leading conference: Dr. Alger Simons Care Coodinator Present: Loralee Pacas, LCSWA;Mickey Hebel Creig Hines, RN, BSN, CRRN Nurse Present: Dorthula Nettles, RN PT Present: Stacy Gardner, PT OT Present: Cherylynn Ridges, OT SLP Present: Weston Anna, SLP PPS Coordinator present : Gunnar Fusi, SLP     Current Status/Progress Goal Weekly Team Focus  Bowel/Bladder   cont B/B. last BM-4/11  remain continent B/B  assess q shift and PRN   Swallow/Nutrition/ Hydration             ADL's   MIn A overall  Supervision  self-care retraining, functional transfers, pain management, balance, generalized strengthening   Mobility   ModA bed mob, MinA all else  supervision overall  endurance, activity tolerance, gait progressions, functional strength   Communication             Safety/Cognition/ Behavioral Observations  Moderate Assistance for memory, problem solving and organization.  Supervision  Using [employer], organization, processing information.   Pain   c/o pain 8 of 10 to back, radiating to hip/leg  pain<3  assess q shift and PRN   Skin   surg. incision head( r side) healing.staples removed.  free of s/s of infection  assess skin  q shift and PRN     Discharge Planning:  Pt needs to be MOD I at d/c. Pt will have intermittent support from husband  who works, and adult dtr who works 3hrs per day. 50 y.o dtr to help as able as she is still in [place] and will be leaving for college after graduation.   Team Discussion: Patient has connective tissue disorder, found down by daughter. SAH with hydrocephalus, crani with staples and sutures which should be dc'd. Do not use a bedpan with this patient, she can get up to the [facility] or take her into the bathroom. She reports that her dog is being put down on Friday and would like to go outside to see her before the family takes her to the vet. Patient is continent B/B, no pain, or sleep issues reported. Two incisions to the head are clean and intact. Family would like patient to be mod I at discharge. Patient on target to meet rehab goals: yes, patient has a gait abnormality that puts her at a high fall risk. She has supervision goals. She is min assist overall. It is unlikely that she will reach a mod I level, she will discharge at a supervision level for her safety. SLP reports she is struggling with words. Family reports that she has been struggling with memory because she had been putting sticky notes all over the computer at home.  *See Care Plan and progress notes for long and short-term goals.   Revisions to Treatment Plan:  Working to bulk up stool.  Teaching Needs: Family education, medication management, pain management, skin/wound care, gait  training, transfer training, balance training, endurance training, safety awareness, cognition awareness.  Current Barriers to Discharge: Decreased caregiver support, Medical stability, Home enviroment access/layout, Wound care, Lack of/limited family support, Medication compliance and Behavior  Possible Resolutions to Barriers: Continue current medications, provide emotional support.     Medical Summary Current Status: SAH with hydrocephalus. cognitively improving, CT disorder. pain an issue. loose stool (d/t plaquenil).  Barriers to Discharge: Medical  stability   Possible Resolutions to [employer] Focus: working to bulk up stool. pain mgt, daily assessment of labs, pt data   Continued Need for Acute Rehabilitation Level of Care: The patient requires daily medical management by a physician with specialized training in physical medicine and rehabilitation for the following reasons: Direction of a multidisciplinary physical rehabilitation program to maximize functional independence : Yes Medical management of patient stability for increased activity during participation in an intensive rehabilitation regime.: Yes Analysis of laboratory values and/or radiology reports with any subsequent need for medication adjustment and/or medical intervention. : Yes   I attest that I was present, lead the team conference, and concur with the assessment and plan of the team.   Cristi Loron 07/14/2020, 4:22 PM

## 2020-07-14 NOTE — Progress Notes (Signed)
Patient ID: Amy Barry, female   DOB: 06-19-1970, 50 y.o.   MRN: [id number]  SW met with pt, [name], and pt dtr Amy Barry to provide updates from team conference, and d/c date 4/22. Pt was sleeping during time of visit. Sw discussed how pt will require 24/7 care at discharge, and who will provide support. Pt husband stated pt will have someone at the home with her. They also report that when she was paralyzed the first time, they had cameras installed in the home so they could watch her when they weren't home. Fam education scheduled for Sun (4/17) 10am-12pm with her husband and two daughters.   Loralee Pacas, MSW, [place] Office: [phone] Cell: [phone] Fax: [phone]

## 2020-07-15 DIAGNOSIS — I609 Nontraumatic subarachnoid hemorrhage, unspecified: Secondary | ICD-10-CM | POA: Diagnosis not present

## 2020-07-15 DIAGNOSIS — I10 Essential (primary) hypertension: Secondary | ICD-10-CM | POA: Diagnosis not present

## 2020-07-15 DIAGNOSIS — E876 Hypokalemia: Secondary | ICD-10-CM | POA: Diagnosis not present

## 2020-07-15 DIAGNOSIS — E871 Hypo-osmolality and hyponatremia: Secondary | ICD-10-CM | POA: Diagnosis not present

## 2020-07-15 LAB — GLUCOSE, CAPILLARY
Glucose-Capillary: 106 mg/dL — ABNORMAL HIGH (ref 70–99)
Glucose-Capillary: 95 mg/dL (ref 70–99)
Glucose-Capillary: 106 mg/dL — ABNORMAL HIGH (ref 70–99)
Glucose-Capillary: 110 mg/dL — ABNORMAL HIGH (ref 70–99)
Glucose-Capillary: 87 mg/dL (ref 70–99)

## 2020-07-15 NOTE — Progress Notes (Signed)
Speech Language Pathology Daily Session Note  Patient Details  Name: Amy Barry MRN: [id number] Date of Birth: 1971-03-07  Today's Date: 07/15/2020 SLP Individual Time: 0930-1030 SLP Individual Time Calculation (min): 60 min  Short Term Goals: Week 1: SLP Short Term Goal 1 (Week 1): Patient will recall at least one activity completed in each of the therapy disciplines (PT, OT, ST) and at least one intervention from nursing with  [place]. SLP Short Term Goal 2 (Week 1): Patient will answer delayed recall questions after listening to short (few sentence paragraph level) text with 80% accuracy and minA. SLP Short Term Goal 3 (Week 1): Patient will summarize after SLP read aloud of 3-4 sentence paragraph with 75% accuracy and modA. SLP Short Term Goal 4 (Week 1): Patient will utilize strategies and environmental cues to orient to time of day, date and recall daily activities completed with minA cues.  Skilled Therapeutic Interventions: Skilled treatment session focused on cognitive goals. Upon arrival, patient was awake in bed with her mother present. Patient had both her memory folder and glasses case in the designated place to maximize utilization and had already crossed out yesterday's date on her calendar and had been writing information from today in her memory folder.  Initiated discussion about utilization of memory compensatory strategies at home like a paper calendar to record appointments as well as utilizing "alexa" to set alarms for medication reminders. Patient verbalized understanding. As SLP was assisting with repositioning the patient in bed, SLP noticed the patient had been incontinent of urine without awareness. Patient ambulated to the bathroom with a RW and was continent of bowel and bladder. SLP also assisted with changing her brief and pants. Patient ambulated back to bed. Patient left upright in bed with alarm on and all needs within reach. Continue with current plan of care.        Pain No/Denies Pain   Therapy/Group: Individual Therapy  Amy Barry 07/15/2020, 3:55 PM

## 2020-07-15 NOTE — Progress Notes (Signed)
Occupational Therapy Session Note  Patient Details  Name: Amy Barry MRN: [id number] Date of Birth: 1970-12-31  Today's Date: 07/15/2020 OT Individual Time: 1400-1445 OT Individual Time Calculation (min): 45 min  and Today's Date: 07/15/2020 OT Missed Time: 15 Minutes Missed Time Reason: Patient fatigue   Short Term Goals: Week 1:  OT Short Term Goal 1 (Week 1): Pt will complete LB dressing at sit<stand level with 1 assist OT Short Term Goal 2 (Week 1): Pt will complete UB dressing while sitting unsupported with no more than Min balance assist OT Short Term Goal 3 (Week 1): Pt will complete toilet transfer with 1 assist and LRAD  Skilled Therapeutic Interventions/Progress Updates:    Pt sidelying, mid transfer back to supine after completing toileting with NT. Pt c/o fatigue and back pain. Pt initially stating fatigue is too high to participate but was then motivated to take shower. She completed ambulatory transfer from EOB to the walk in shower with min A using RW. She required mod cueing for safety transferring into shower and removing clothes. Pt bathed UB seated on TTB with supervision. CGA provided during standing level peri hygiene. She donned shirt with supervision. Mod A to don pants- to thread over BLE and for slight assist to pull up in standing. Pt returned to EOB- functional mobility with AD- ataxic gait with B hip hiking. Pt returned to supine and reported fatigue was very high, missing last 15 min of session. Bed alarm set and all needs met.   Therapy Documentation Precautions:  Precautions Precautions: Fall Restrictions Weight Bearing Restrictions: No   Therapy/Group: Individual Therapy  Curtis Sites 07/15/2020, 6:32 AM

## 2020-07-15 NOTE — Progress Notes (Signed)
PROGRESS NOTE   Subjective/Complaints: Pt woke up with frontal headache after her headaches had been much better this week.   ROS: Patient denies fever, rash, sore throat, blurred vision, nausea, vomiting, diarrhea, cough, shortness of breath or chest pain, joint or back pain     Objective:   No results found. No results for input(s): WBC, HGB, HCT, PLT in the last 72 hours. Recent Labs    07/13/20 0621  NA 130*  K 4.1  CL 102  CO2 21*  GLUCOSE 110*  BUN 6  CREATININE 0.63  CALCIUM 9.3   No intake or output data in the 24 hours ending 07/15/20 0815      Physical Exam: Vital Signs Blood pressure 119/66, pulse 78, temperature 98.7 F (37.1 C), temperature source Oral, resp. rate 17, height 5\' 4"  (1.626 m), weight 82.9 kg, last menstrual period 12/22/2017, SpO2 100 %.  Constitutional: mild distress . Vital signs reviewed. HEENT: EOMI, oral membranes moist Neck: supple Cardiovascular: RRR without murmur. No JVD    Respiratory/Chest: CTA Bilaterally without wheezes or rales. Normal effort    GI/Abdomen: BS +, non-tender, non-distended Ext: no clubbing, cyanosis, or edema Psych: pleasant and cooperative, sl anxious Skin: right frontal scalp wounds clean and intact Neuro: Pt is cognitively appropriate with normal insight, memory, and awareness. Cranial nerves 2-12 are intact. Sensory exam is normal. Reflexes are 2+ in all 4's. Fine motor coordination is intact. No tremors. Motor function is grossly 5/5 in UE. LE: limited d/t pain proximall, 2/5 HF, KE and 4+/5 ADF/PF. No motor/sensory changes Musculoskeletal: +thigh tenderness   Assessment/Plan: 1. Functional deficits which require 3+ hours per day of interdisciplinary therapy in a comprehensive inpatient rehab setting.  Physiatrist is providing close team supervision and 24 hour management of active medical problems listed below.  Physiatrist and rehab team  continue to assess barriers to discharge/monitor patient progress toward functional and medical goals  Care Tool:  Bathing    Body parts bathed by patient: Right arm,Left arm,Chest,Abdomen,Front perineal area,Right upper leg,Left upper leg,Face   Body parts bathed by helper: Buttocks,Right lower leg,Left lower leg     Bathing assist Assist Level: Maximal Assistance - Patient 24 - 49%     Upper Body Dressing/Undressing Upper body dressing   What is the patient wearing?: Pull over shirt,Hospital gown only    Upper body assist Assist Level: Minimal Assistance - Patient > 75%    Lower Body Dressing/Undressing Lower body dressing      What is the patient wearing?: Incontinence brief     Lower body assist Assist for lower body dressing: Maximal Assistance - Patient 25 - 49%     Toileting Toileting Toileting Activity did not occur (Clothing management and hygiene only): N/A (no void or bm)  Toileting assist Assist for toileting: Moderate Assistance - Patient 50 - 74%     Transfers Chair/bed transfer  Transfers assist     Chair/bed transfer assist level: Contact Guard/Touching assist     Locomotion Ambulation   Ambulation assist      Assist level: Contact Guard/Touching assist Assistive device: [place]-rolling Max distance: 30   Walk 10 feet activity   Assist  Assist level: Contact Guard/Touching assist Assistive device: Raap-rolling   Walk 50 feet activity   Assist Walk 50 feet with 2 turns activity did not occur: Refused         Walk 150 feet activity   Assist Walk 150 feet activity did not occur: Refused         Walk 10 feet on uneven surface  activity   Assist Walk 10 feet on uneven surfaces activity did not occur: Safety/medical concerns         Wheelchair     Assist Will patient use wheelchair at discharge?: No Type of Wheelchair: Manual Wheelchair activity did not occur: Refused         Wheelchair 50 feet  with 2 turns activity    Assist            Wheelchair 150 feet activity     Assist          Blood pressure 119/66, pulse 78, temperature 98.7 F (37.1 C), temperature source Oral, resp. rate 17, height 5\' 4"  (1.626 m), weight 82.9 kg, last menstrual period 12/22/2017, SpO2 100 %.  Medical Problem List and Plan: 1.  Impaired cognition/mobility and ADLs secondary to [facility] within basal cisterns and ventricular system-               -patient may shower               -ELOS/Goals: 14-18 days- supervision to min A  -Continue CIR therapies including PT, OT, and SLP     -may have grounds pass to see dog  2.  Antithrombotics: -DVT/anticoagulation:  Mechanical: Sequential compression devices, below knee Bilateral lower extremities             -antiplatelet therapy: N/A 3. Pain Management: Was using NSAIDS tid at home             --will continue Oxycodone as needed  -gabapentin/kpad for myalgias, pain per home regimen   -on scheduled robaxin 750mg  qid  4/13 consider topamax trial for headaches, local/prn rx for now   -no other associated sx 4. Mood: LCSW to follow for evaluation and support.                         -antipsychotic agents: N/A 5. Neuropsych: This patient is not fully capable of making decisions on her own behalf. 6. Skin/Wound Care: Routine pressure-relief measures. Should be able to remove vent clips RIgh tfrontal in am  7. Fluids/Electrolytes/Nutrition:    -hypokalemia: 4.1 4/11 continue supplement 8. Hyponatremia:  sodium 130 4/11             --continue 1500 cc FR              -continue salt tabs  -recheck labs Thursday 9.  Seizure prophylaxis: Continue Keppra twice daily.   10. HTN/Tachycardia: Monitor blood pressures 3 times daily.             --Continue Nimotop every 4 hours to prevent vasospasms.             --Continue metoprolol and amlodipine                --lasix held d/t hyponatremia--continue to hold.  -4/12 bp controlled 11.  Recurrent  hypokalemia:   Kdur for supplementation daily.  -K+ 4.1 4/11 12.  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 (Had intermittent weakness with RLE with foot drop progressing to left sided weakness--needed therapy X 2  years for recovery)             --  methotrexate--d'ced per patient request.             --was off plaquenil due to diarrhea.                 13.  Stress related hyperglycemia: Hgb A1c-5.4.  Continue to monitor blood sugars AC/at bedtime.             --  sliding scale insulin for elevated blood sugars. 14.  Hypocalcemia: Add calcium for oral supplementation 15. Bowel incontinence: weaning off plaquenil--ends thursday  -stopped softeners and laxatives  -added probiotic, fiber  -up to toilet to empty  -added questran to help with stool consistency  -4/13 stools are improving 16. Fevers: afebrile last 24+ hr  -CXR negative 4/9.  -continue to follow   LOS: 6 days A FACE TO FACE EVALUATION WAS PERFORMED  Meredith Staggers 07/15/2020, 8:15 AM

## 2020-07-15 NOTE — Progress Notes (Signed)
Physical Therapy Session Note  Patient Details  Name: Amy Barry MRN: [id number] Date of Birth: 12-22-1970  Today's Date: 07/15/2020 PT Individual Time: 1125-1220 PT Individual Time Calculation (min): 55 min   Short Term Goals: Week 1:  PT Short Term Goal 1 (Week 1): Pt will transfer sup to sit w/ log roll and min A PT Short Term Goal 2 (Week 1): Pt will transfer sit to stand and SPT w/ min A PT Short Term Goal 3 (Week 1): Pt will amb 100' w/ RW and CGA.  Skilled Therapeutic Interventions/Progress Updates:     Patient in bed with her mother in the room upon PT arrival. Patient alert and agreeable to PT session. Patient reported 3-4/10 headache during session with increased fatigue and closing her eyes in lying at beginning of session, RN made aware. PT provided repositioning, rest breaks, and distraction as pain interventions throughout session. Vitals: BP 122/86, HR 78, SPO2 100%.  Therapeutic Activity: Bed Mobility: Patient performed supine to/from sit with min A-CGA for trunk and lower extremity management. Provided verbal cues for hand placement to push up to sitting, as patient tends to reach for therapist to grab onto. Transfers: Patient performed sit to/from stand x2, toilet transfer x1, and stand pivot x1 with CGA. Provided verbal cues for pushing up and hand placement on RW x2, forward weight shift, and TA activation with transitional movements for reduced back pain/spasm. Patient was continent of bowl and bladder during toileting, noted loose BM and patient reports that she has discussed this with nursing. Performed peri-care and lower body clothing management with set-up assist.   Gait Training:  Patient ambulated 15 feet to/from the bathroom using RW with CGA. Ambulated with decreased gait speed, decreased step length and height,mild forward trunk lean, and downward head gaze, without other gait deficits noted. Provided verbal cues for erect posture, looking ahead, and increased  step height for safety. Patient ascended/descended 8-6" steps using B rails with CGA-min A. Performed reciprocal gait pattern x4 steps and step-to gait pattern x4 steps due to increasing fatigue. Provided cues for technique and sequencing. Discussed home stair set-up. Patient with pictures on her phone of 2 sets of 6-7 steps with a landing with a single step between. Educated on placing a chair on the landing for energy conservation and minimizing trips up/down the stairs to 1x per day as needed when first returning home for safety, patient in agreement.   Wheelchair Mobility:  Patient was transported in the w/c with total A throughout session for energy conservation and time management.  Patient required increased time and rest breaks with all mobility. Provided education on patient's progress, POC, d/c planning, and PT goals during rest breaks.  Patient in bed with her mother in the room at end of session with breaks locked, bed alarm set, and all needs within reach.    Therapy Documentation Precautions:  Precautions Precautions: Fall Restrictions Weight Bearing Restrictions: No   Therapy/Group: Individual Therapy  Neasia Fleeman L Joylynn Defrancesco PT, DPT  07/15/2020, 4:42 PM

## 2020-07-16 DIAGNOSIS — I10 Essential (primary) hypertension: Secondary | ICD-10-CM | POA: Diagnosis not present

## 2020-07-16 DIAGNOSIS — I609 Nontraumatic subarachnoid hemorrhage, unspecified: Secondary | ICD-10-CM | POA: Diagnosis not present

## 2020-07-16 DIAGNOSIS — E876 Hypokalemia: Secondary | ICD-10-CM | POA: Diagnosis not present

## 2020-07-16 DIAGNOSIS — E871 Hypo-osmolality and hyponatremia: Secondary | ICD-10-CM | POA: Diagnosis not present

## 2020-07-16 LAB — BASIC METABOLIC PANEL
Anion gap: 7 (ref 5–15)
BUN: 6 mg/dL (ref 6–20)
CO2: 23 mmol/L (ref 22–32)
Calcium: 9.6 mg/dL (ref 8.9–10.3)
Chloride: 102 mmol/L (ref 98–111)
Creatinine, Ser: 0.65 mg/dL (ref 0.44–1.00)
GFR, Estimated: >60 mL/min (ref >60)
Glucose, Bld: 108 mg/dL — ABNORMAL HIGH (ref 70–99)
Potassium: 4.9 mmol/L (ref 3.5–5.1)
Sodium: 132 mmol/L — ABNORMAL LOW (ref 135–145)

## 2020-07-16 LAB — GLUCOSE, CAPILLARY
Glucose-Capillary: 103 mg/dL — ABNORMAL HIGH (ref 70–99)
Glucose-Capillary: 100 mg/dL — ABNORMAL HIGH (ref 70–99)
Glucose-Capillary: 110 mg/dL — ABNORMAL HIGH (ref 70–99)
Glucose-Capillary: 110 mg/dL — ABNORMAL HIGH (ref 70–99)

## 2020-07-16 MED ORDER — TOPIRAMATE 25 MG PO TABS
25.0000 mg | ORAL_TABLET | Freq: Every day | ORAL | Status: DC
  Administered 2020-07-16: 25 mg via ORAL
  Filled 2020-07-16: qty 1

## 2020-07-16 NOTE — Progress Notes (Signed)
PROGRESS NOTE   Subjective/Complaints: Woke up with another headache. Said she slept well. Head may have been cocked forward a bit while on pillow.  Headaches still mostly frontal however  ROS: Patient denies fever, rash, sore throat, blurred vision, nausea, vomiting, diarrhea, cough, shortness of breath or chest pain, joint or back pain,  or mood change.   Objective:   No results found. No results for input(s): WBC, HGB, HCT, PLT in the last 72 hours. Recent Labs    07/16/20 0513  NA 132*  K 4.9  CL 102  CO2 23  GLUCOSE 108*  BUN 6  CREATININE 0.65  CALCIUM 9.6    Intake/Output Summary (Last 24 hours) at 07/16/2020 1021 Last data filed at 07/16/2020 0735 Gross per 24 hour  Intake 120 ml  Output --  Net 120 ml        Physical Exam: Vital Signs Blood pressure 128/77, pulse 79, temperature 98.8 F (37.1 C), temperature source Oral, resp. rate 20, height 5\' 4"  (1.626 m), weight 82.9 kg, last menstrual period 12/22/2017, SpO2 100 %.  Constitutional: No distress . Vital signs reviewed. HEENT: EOMI, oral membranes moist Neck: supple Cardiovascular: RRR without murmur. No JVD    Respiratory/Chest: CTA Bilaterally without wheezes or rales. Normal effort    GI/Abdomen: BS +, non-tender, non-distended Ext: no clubbing, cyanosis, or edema Psych: pleasant and cooperative Skin: scalp wounds healed Neuro: Pt is cognitively appropriate with normal insight, memory, and awareness. Cranial nerves 2-12 are intact. Sensory exam is normal. Reflexes are 2+ in all 4's. Fine motor coordination is intact. No tremors. Motor function is grossly 5/5 in UE. LE: limited d/t pain proximall, 2/5 HF, KE and 4+/5 ADF/PF. No neuro changes Musculoskeletal: Still b/l thigh tenderness   Assessment/Plan: 1. Functional deficits which require 3+ hours per day of interdisciplinary therapy in a comprehensive inpatient rehab setting.  Physiatrist is  providing close team supervision and 24 hour management of active medical problems listed below.  Physiatrist and rehab team continue to assess barriers to discharge/monitor patient progress toward functional and medical goals  Care Tool:  Bathing    Body parts bathed by patient: Right arm,Left arm,Chest,Abdomen,Front perineal area,Buttocks,Right upper leg,Left upper leg,Face   Body parts bathed by helper: Right lower leg,Left lower leg     Bathing assist Assist Level: Minimal Assistance - Patient > 75%     Upper Body Dressing/Undressing Upper body dressing   What is the patient wearing?: Pull over shirt    Upper body assist Assist Level: Supervision/Verbal cueing    Lower Body Dressing/Undressing Lower body dressing      What is the patient wearing?: Underwear/pull up,Pants     Lower body assist Assist for lower body dressing: Minimal Assistance - Patient > 75%     Toileting Toileting Toileting Activity did not occur (Clothing management and hygiene only): N/A (no void or bm)  Toileting assist Assist for toileting: Contact Guard/Touching assist     Transfers Chair/bed transfer  Transfers assist     Chair/bed transfer assist level: Contact Guard/Touching assist     Locomotion Ambulation   Ambulation assist      Assist level: Contact Guard/Touching assist Assistive  device: Ruta-rolling Max distance: 15 ft   Walk 10 feet activity   Assist     Assist level: Contact Guard/Touching assist Assistive device: Paschen-rolling   Walk 50 feet activity   Assist Walk 50 feet with 2 turns activity did not occur: Refused         Walk 150 feet activity   Assist Walk 150 feet activity did not occur: Refused         Walk 10 feet on uneven surface  activity   Assist Walk 10 feet on uneven surfaces activity did not occur: Safety/medical concerns         Wheelchair     Assist Will patient use wheelchair at discharge?: No Type of  Wheelchair: Manual Wheelchair activity did not occur: Refused         Wheelchair 50 feet with 2 turns activity    Assist            Wheelchair 150 feet activity     Assist          Blood pressure 128/77, pulse 79, temperature 98.8 F (37.1 C), temperature source Oral, resp. rate 20, height 5\' 4"  (1.626 m), weight 82.9 kg, last menstrual period 12/22/2017, SpO2 100 %.  Medical Problem List and Plan: 1.  Impaired cognition/mobility and ADLs secondary to [facility] within basal cisterns and ventricular system-               -patient may shower               -ELOS/Goals: 14-18 days- supervision to min A  -Continue CIR therapies including PT, OT, and SLP      -may have grounds pass to see dog  2.  Antithrombotics: -DVT/anticoagulation:  Mechanical: Sequential compression devices, below knee Bilateral lower extremities             -antiplatelet therapy: N/A 3. Pain Management: Was using NSAIDS tid at home             --will continue Oxycodone as needed  -gabapentin/kpad for myalgias, pain per home regimen   -on scheduled robaxin 750mg  qid  4/14 begin topamax 25mg  qhs for headaches 4. Mood: LCSW to follow for evaluation and support.                         -antipsychotic agents: N/A 5. Neuropsych: This patient is not fully capable of making decisions on her own behalf. 6. Skin/Wound Care: Routine pressure-relief measures. Should be able to remove vent clips RIgh tfrontal in am  7. Fluids/Electrolytes/Nutrition:    -hypokalemia: potassium up to 4.9 4/14, can dc supplement 8. Hyponatremia:  sodium 130 4/11             --continue 1500 cc FR              -continue salt tabs  -Na+ 132 today---continue same plan 9.  Seizure prophylaxis: Continue Keppra twice daily.   10. HTN/Tachycardia: Monitor blood pressures 3 times daily.             --Continue Nimotop every 4 hours to prevent vasospasms.             --Continue metoprolol and amlodipine                --lasix held d/t  hyponatremia--continue to hold.  -4/14 bp controlled 12.  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 (Had intermittent weakness with RLE with foot drop progressing to left sided weakness--needed  therapy X 2 years for recovery)             --  methotrexate--d'ced per patient request.             --off plaquenil today.                 13.  Stress related hyperglycemia: Hgb A1c-5.4.  Continue to monitor blood sugars AC/at bedtime.             --  sliding scale insulin for elevated blood sugars. 14.  Hypocalcemia: Add calcium for oral supplementation 15. Bowel incontinence: weaning off plaquenil--ends thursday  -stopped softeners and laxatives  -added probiotic, fiber  -up to toilet to empty  -added questran to help with stool consistency  -4/14 stools are improving, mushy this morning 16. Fevers: afebrile last 24+ hr  -CXR negative 4/9.  -continue to follow   LOS: 7 days A FACE TO FACE EVALUATION WAS PERFORMED  Meredith Staggers 07/16/2020, 10:21 AM

## 2020-07-16 NOTE — Progress Notes (Signed)
Physical Therapy Session Note  Patient Details  Name: Amy Barry MRN: [id number] Date of Birth: 13-Nov-1970  Today's Date: 07/16/2020 PT Individual Time: 1430-1515 PT Individual Time Calculation (min): 45 min  Short Term Goals: Week 1:  PT Short Term Goal 1 (Week 1): Pt will transfer sup to sit w/ log roll and min A PT Short Term Goal 2 (Week 1): Pt will transfer sit to stand and SPT w/ min A PT Short Term Goal 3 (Week 1): Pt will amb 100' w/ RW and CGA.  Skilled Therapeutic Interventions/Progress Updates:     Patient in bed with her daughters in the room upon PT arrival. Patient alert and agreeable to PT session. Patient reported 5 progressing to 8/10 headache and lower extremity burning pain during session, RN made aware. PT provided repositioning, rest breaks, and distraction as pain interventions throughout session.   Therapeutic Activity: Bed Mobility: Patient performed supine to/from sit with CGA for trunk management in a flat bed with use of bedrail. Provided verbal cues for log roll technique and using of B upper extremities to push up to sitting rather than reaching for assist from PT. Doffed/donned non-skid socks and donned/doffed B socks and tennis shoes with total A for energy/time management with patient sitting EOB.  Transfers: Patient performed sit to/from stand x2 and stand pivot bed<>w/c with CGA using RW. Provided verbal cues for hand placement and controlled descent for safety.  Gait Training:  Patient ambulated 78 feet on first trial in tennis shoes, limited by poor balance and hip/knee instability, then ambulated 147 feet in non-skid socks with improved balance and hip/knee stability using RW both trials with CGA-min A on first trial and CGA on second trial and +2 w/c follow for safety. Ambulated with progressive lateral hip instability with medial collapse and the knees and ankles, increased lateral trunk sway R>L, forward trunk flexion, increased use of upper  extremities, and decreased B foot clearance R>L, all deficits exaggerated with tennis shoes and increased fatigue. Provided multi-modal cues for erect posture, lateral hip activation in stance, and heel strike at initial contact for improved DF activation and foot clearance. Patient and her daughter report that her gait was similar PTA "on bad days" and that she does not wear tennis shoes because she is "more wabbly" when she does. State that she wears slip on Crocs most day, her daughter stated that she will bring them to trial during therapy. Also, reports history of multiple falls in the past year due to lower extremity instability. Stated that patient could walk long distances at the store or park on good days without instability or AD PTA.  Patient reported increased headache and burning leg pain after gait training and requested to return to bed at this time. Patient missed 30 min of skilled PT due to pain/fatigue, RN made aware. Will attempt to make-up missed time as able.  Patient in bed with her daughters in the room at end of session with breaks locked, bed alarm set, and all needs within reach.    Therapy Documentation Precautions:  Precautions Precautions: Fall Restrictions Weight Bearing Restrictions: No   Therapy/Group: Individual Therapy  Ayelet Gruenewald L Rolene Andrades PT, DPT  07/16/2020, 4:41 PM

## 2020-07-16 NOTE — Progress Notes (Signed)
Occupational Therapy Session Note  Patient Details  Name: Amy Barry MRN: [id number] Date of Birth: Feb 11, 1971  Today's Date: 07/16/2020 OT Individual Time: 0700-0805 OT Individual Time Calculation (min): 65 min    Short Term Goals: Week 1:  OT Short Term Goal 1 (Week 1): Pt will complete LB dressing at sit<stand level with 1 assist OT Short Term Goal 2 (Week 1): Pt will complete UB dressing while sitting unsupported with no more than Min balance assist OT Short Term Goal 3 (Week 1): Pt will complete toilet transfer with 1 assist and LRAD  Skilled Therapeutic Interventions/Progress Updates:    Pt eating breakfast while in bed upon arrival. Pt agreeable to getting OOB to wash and change clothing at sink Supine>sit EOB and amb with RW to bathroom with CGA. Toileting with CGA. Pt completed bathing/dressing tasks with sit<>stand at sink. Pt required assistance with threading BLE into pants and donning socks. Sit<>stand at sink with supervision. Pt tearful regarding her"condition" and her dog having to be euthanized tomorrow. Emotional support provided. Pt requires more then a reasonable amount of time to complete tasks. Pt required min verbal cues for date and day of week. Pt required min verbal cues to use red notebook. Pt writing in notebook when therapist exited room. All needs within reach and bed alarm activated.  Therapy Documentation Precautions:  Precautions Precautions: Fall Restrictions Weight Bearing Restrictions: No   Pain:  Pt c/o 7/10 pain in buttocks and radiating down through RLE; repositioned and activity, emotional support  Therapy/Group: Individual Therapy  Leroy Libman 07/16/2020, 8:11 AM

## 2020-07-16 NOTE — Progress Notes (Signed)
Speech Language Pathology Daily Session Note  Patient Details  Name: Amy Barry MRN: [id number] Date of Birth: 01-11-1971  Today's Date: 07/16/2020 SLP Individual Time: 1030-1130 SLP Individual Time Calculation (min): 60 min  Short Term Goals: Week 1: SLP Short Term Goal 1 (Week 1): Patient will recall at least one activity completed in each of the therapy disciplines (PT, OT, ST) and at least one intervention from nursing with  [place]. SLP Short Term Goal 2 (Week 1): Patient will answer delayed recall questions after listening to short (few sentence paragraph level) text with 80% accuracy and minA. SLP Short Term Goal 3 (Week 1): Patient will summarize after SLP read aloud of 3-4 sentence paragraph with 75% accuracy and modA. SLP Short Term Goal 4 (Week 1): Patient will utilize strategies and environmental cues to orient to time of day, date and recall daily activities completed with minA cues.  Skilled Therapeutic Interventions: Skilled treatment session focused on cognitive goals. Upon arrival, patient reported a headache. RN aware and patient premedicated. Patient reported that she was able to easily find her memory folder and glasses case this morning and that it was placed in its designated space. Patient recalled events from morning therapy session independently and independently utilized her memory notebook to write down questions for staff. SLP facilitated session by providing supervision level verbal cues to self-monitor and correct errors during a mildly complex appointment making task. Supervision level verbal cues were also needed for specificity of information to maximize recall and utilization. Overall, patient continues to demonstrate functional improvement. Patient left upright in bed with alarm on and all needs within reach. Continue with current plan of care.      Pain Pain Assessment Pain Scale: 0-10 Pain Score: 6  Pain Type: Intractable pain Pain Location: Head Pain  Orientation: Other (Comment) (all the way around) Pain Descriptors / Indicators: Headache Pain Frequency: Intermittent Pain Onset: Gradual Pain Intervention(s): Medication (See eMAR)  Therapy/Group: Individual Therapy  Amy Barry 07/16/2020, 2:34 PM

## 2020-07-17 DIAGNOSIS — R4189 Other symptoms and signs involving cognitive functions and awareness: Secondary | ICD-10-CM

## 2020-07-17 DIAGNOSIS — E876 Hypokalemia: Secondary | ICD-10-CM | POA: Diagnosis not present

## 2020-07-17 DIAGNOSIS — I609 Nontraumatic subarachnoid hemorrhage, unspecified: Secondary | ICD-10-CM | POA: Diagnosis not present

## 2020-07-17 DIAGNOSIS — E871 Hypo-osmolality and hyponatremia: Secondary | ICD-10-CM | POA: Diagnosis not present

## 2020-07-17 DIAGNOSIS — R269 Unspecified abnormalities of gait and mobility: Secondary | ICD-10-CM

## 2020-07-17 DIAGNOSIS — I10 Essential (primary) hypertension: Secondary | ICD-10-CM | POA: Diagnosis not present

## 2020-07-17 DIAGNOSIS — R41844 Frontal lobe and executive function deficit: Secondary | ICD-10-CM

## 2020-07-17 DIAGNOSIS — G441 Vascular headache, not elsewhere classified: Secondary | ICD-10-CM

## 2020-07-17 LAB — GLUCOSE, CAPILLARY
Glucose-Capillary: 102 mg/dL — ABNORMAL HIGH (ref 70–99)
Glucose-Capillary: 107 mg/dL — ABNORMAL HIGH (ref 70–99)
Glucose-Capillary: 98 mg/dL (ref 70–99)
Glucose-Capillary: 104 mg/dL — ABNORMAL HIGH (ref 70–99)

## 2020-07-17 MED ORDER — TOPIRAMATE 25 MG PO TABS
25.0000 mg | ORAL_TABLET | Freq: Two times a day (BID) | ORAL | Status: DC
  Administered 2020-07-17 – 2020-07-18 (×3): 25 mg via ORAL
  Filled 2020-07-17 (×3): qty 1

## 2020-07-17 NOTE — Progress Notes (Signed)
Occupational Therapy Weekly Progress Note  Patient Details  Name: ANALISA SLEDD MRN: [id number] Date of Birth: [birth year]  Beginning of progress report period: July 10, 2020 End of progress report period: July 17, 2020  Today's Date: 07/17/2020 OT Individual Time: 1500-1530 OT Individual Time Calculation (min): 30 min    Patient has met 3 of 3 short term goals. Patient is making steady progress towards OT goals. She is at an overall CGA level for BADL tasks at this time. Pt continues to have balance, endurance, and strength deficits affecting BADL performance. Continue current POC.  Patient continues to demonstrate the following deficits: muscle weakness, decreased cardiorespiratoy endurance, unbalanced muscle activation, ataxia and decreased motor planning, decreased problem solving and decreased memory and decreased standing balance, decreased postural control and decreased balance strategies and therefore will continue to benefit from skilled OT intervention to enhance overall performance with BADL.  Patient progressing toward long term goals..  Continue plan of care.  OT Short Term Goals Week 1:  OT Short Term Goal 1 (Week 1): Pt will complete LB dressing at sit<stand level with 1 assist OT Short Term Goal 1 - Progress (Week 1): Met OT Short Term Goal 2 (Week 1): Pt will complete UB dressing while sitting unsupported with no more than Min balance assist OT Short Term Goal 2 - Progress (Week 1): Met OT Short Term Goal 3 (Week 1): Pt will complete toilet transfer with 1 assist and LRAD OT Short Term Goal 3 - Progress (Week 1): Met Week 2:  OT Short Term Goal 1 (Week 2): LTG=STG 2/2 ELOS  Skilled Therapeutic Interventions/Progress Updates:   Patient missed 15 minutes of OT treatment session as patient off the unit with nurse tech to say goodbye to her dog who was being euthanized this afternoon. Patient greeted sidelying in bed tearful and upset after saying goodbye to her dog. OT  provided emotional support and encouragement to patient as we talked about memories with her dog. Pt agreeable to get out of the room to take her mind off of her dog. Pt completed stand-pivot to therapy wc with supervision. Worked on [employer] task with 2-5 words on BITS, then correlating with pictures. Pt struggled with task initially as she was still internally distracted. Eventually able to recall up to 5 words. Worked on visual scanning in all 4 quadrants. Pt returned to room and left seated in wc as tech changed bed linens. Pt left in care of nurse tech with needs met.   Therapy Documentation Precautions:  Precautions Precautions: Fall Restrictions Weight Bearing Restrictions: No General: General OT Amount of Missed Time: 15 Minutes Pain: Denies pain at this time Therapy/Group: Individual Therapy  Valma Cava 07/17/2020, 3:56 PM

## 2020-07-17 NOTE — Consult Note (Signed)
Neuropsychological Consultation   Patient:   Amy Barry   DOB:   [birth year]  MR Number:  [id number]  Location:  [facility] [address] [facility] [place] [id number] [place] [address] Dept: [place]: [phone]           Date of Service:   07/17/2020  Start Time:   9:30 AM End Time:   10:30 AM  Provider/Observer:  Ilean Skill, Psy.D.       Clinical Neuropsychologist       Billing Code/Service: [phone]  Chief Complaint:    Amy Barry is a 50 year old female with past medical history including hypertension, irritable bowel syndrome, anemia, undetermined connective tissue disease disorder and family history of cerebral aneurysms.  Patient has been followed by neurology prior to the most recent events because of changes and abnormalities in gait, weakness, and headache.  Patient had MRI done on 2/21 with impression of normal exam with no change noted since 2018.   patient was admitted on 06/29/2020 after being found down by her daughter with slurred speech and confusion.  Patient was found to have a subarachnoid hemorrhage and posterior fossa felt to be aneurysmal in nature.  CT head/neck did not show a definite source of bleed but had poor visualization of proximal left ICA with tiny bulbous area and question of rupture or dissection.  Hemorrhage with surrounding edema in right frontal lobe region measuring 2.1x2 cm and was stable with follow-up CT.    Patient underwent cerebral angio with placement of right frontal ventricular drain and was negative for AVM, aneurysm, fistula or dissection.  Hospital course has been positive for headaches with stiff neck and upper body spasms, development small hematoma along the course of drain in right frontal lobe as well as hypoglycemia.  As subarachnoid hemorrhage and IVH have resolved and intraventricular drain was removed on 4/6 as follow-up CT was without hydrocephalus.  Repeat  cerebral angiogram on 4/4 was negative for aneurysm, AVM or fistula.  Patient with ongoing and continued cognitive deficits with reduced information processing speed confusion and difficulty with executive functioning and problem-solving.  Visual analysis and organization and visual information processing speed are very pronounced.  Her symptoms are quite consistent with right frontal involvement.  Reason for Service:  Patient was referred for neuropsychological consultation due to coping adjustment and emotional distress due to current deficits following subarachnoid hemorrhage.  Below is the HPI for the current admission.  HPI: Amy Barry is a 50 year old female with history of HTN, IBS, anemia, connective tissue disorder-on MTX/Plaquenil, family history of cerebral aneurysms who was admitted on 06/29/2020 after found down by daughter with slurred speech and confusion.  She was found to have [place] and posterior fossa felt to be aneurysmal in nature.  CTA head/neck did not show a definite source of bleed but had poor visualization of proximal left-ICA with tiny bulbous area and question of rupture or dissection.  She was evaluated by Dr. Kathyrn Sheriff and underwent cerebral angio with placement of right frontal ventricular drain and was negative for AVM, aneurysm, fistula or dissection.  Hospital course significant for issues with headaches with neck stiffness and upper back spasms, development small hematoma along course of drain in right frontal lobe as well as hypoglycemia.    SAH and IVH have resolved and intraventricular drain removed on 04/06 as follow-up CT without hydrocephalus.  Repeat cerebral angiogram on 04/04 was negative for aneurysm, AVM or fistula.  She has had issues with progressive hyponatremia with drop in sodium to 127 and transient hypokalemia improved with supplementation.  Lethargy is resolving with improvement in mentation and participation, cognitive deficits with delay in  processing, needs multimodal cues to follow one-step commands, balance deficits with tendency to drift to the left.  Patient with deficits in mobility and ability to carry out ADLs.  CIR was recommended due to functional decline.  Current Status:  Patient was awake and alert but clearly showed anxiety and agitation when I came in.  She had a steady shaking to her leg and acknowledge being quite anxious and distressed.  The patient was able to clearly describe some of the problems she is having with executive functioning and problem-solving most notably visual analysis and organization and visual-spatial/visual judgment deficits.  The patient reports that she has difficulty working out an Mining engineer, processing visual information on her phone and confusion understanding all of the complexities of her recent medical events.  Patient has good memory and recall of past medical events and previous follow-ups by neurology, rehab another issues related to residual effects of her connective tissue disease.  Patient is aware of deficits but the right frontal deficits make it hard for her to understand as etiology leading to greater frustration and worry.  Behavioral Observation: Amy Barry  presents as a 50 y.o.-year-old Right African American Female who appeared her stated age. her dress was Appropriate and she was Well Groomed and her manners were Appropriate to the situation.  her participation was indicative of Appropriate and Redirectable behaviors.  There were physical disabilities noted.  she displayed an appropriate level of cooperation and motivation.     Interactions:    Active Appropriate and Redirectable  Attention:   abnormal and attention span appeared shorter than expected for age  Memory:   abnormal; remote memory intact, recent memory impaired  Visuo-spatial:  abnormal primary related to visual analysis and visual organizational abilities and visual construction abilities/judgment abilities  directly related to right frontal involvement rather than right parietal involvement.  Speech (Volume):  normal  Speech:   normal; normal  Thought Process:  Coherent and Tangential  Though Content:  Rumination; not suicidal and not homicidal  Orientation:   person and place  Judgment:   Poor  Planning:   Poor  Affect:    Anxious  Mood:    Anxious  Insight:   Fair  Intelligence:   high  Medical History:   Past Medical History:  Diagnosis Date  . Arthralgia   . Generalized weakness    bilateral upper and lower extremities,  walks with cane  . GERD (gastroesophageal reflux disease)   . Headache(784.0)   . Heart murmur    per pt "since childhood, no problems"  . History of kidney stones   . Hypertension    followed pcp  . IBS (irritable bowel syndrome)   . Iron deficiency anemia    12-06-2017 per pt recently had IV iron infusion July 2019  . Low vitamin D level   . Osteoarthritis   . PVC (premature ventricular contraction)    per pt had holter monitor  . SLE (systemic lupus erythematosus) ([place])    rheumotologist-- dr syed  . Wears glasses          Patient Active Problem List   Diagnosis Date Noted  . Cognitive deficits   . Subarachnoid bleed ([place]) 07/09/2020  . Subarachnoid hemorrhage ([place]) 06/29/2020  . 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 S/P  hysterectomy 12/13/2017  . Abnormal laboratory test 09/09/2016  . Gait abnormality 08/03/2016  . Weakness 08/03/2016  . Paresthesia 08/03/2016  . S/P cervical spinal fusion 10/23/2015  . Hypersomnia 12/08/2010  . 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  . EPIGASTRIC PAIN 10/13/2009  . IRRITABLE BOWEL SYNDROME 03/10/2009  . BACK PAIN, LUMBAR 07/01/2008  . COSTOCHONDRITIS 02/01/2008  . VIRAL URI 01/08/2008  . CHEST PAIN 01/08/2008  . 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 ARM PAIN 12/25/2007  . ANEMIA-NOS 10/02/2007  . Essential hypertension 10/02/2007  . GERD 10/02/2007  . POLYP, GALLBLADDER 10/02/2007  .  UTERINE POLYP 10/02/2007  . OSTEOARTHRITIS 10/02/2007  . Dyskinesia 10/02/2007  . HEADACHE 10/02/2007  . PALPITATIONS 10/02/2007  . CARDIAC MURMUR, HX OF 10/02/2007  . NEPHROLITHIASIS, HX OF 10/02/2007     Psychiatric History:  No prior psychiatric history but she has been dealing with a number of ongoing medical issues to the years with regard to her connective tissue disease.  Family Med/Psych History:  Family History  Problem Relation Age of Onset  . Arthritis Other   . Hyperlipidemia Other   . Hypertension Daughter   . Colon polyps Daughter 9       hermatoma oversized polyps-benign  . Stroke Other   . Leukemia Maternal Grandmother   . Heart disease Maternal Grandmother   . Asthma Sister   . Hypertension Mother        hx aneursym and surgery  . Atrial fibrillation Mother   . Sarcoidosis Mother   . Healthy Father   . Colon cancer Neg Hx    Impression/DX:  Amy Barry is a 50 year old female with past medical history including hypertension, irritable bowel syndrome, anemia, undetermined connective tissue disease disorder and family history of cerebral aneurysms.  Patient has been followed by neurology prior to the most recent events because of changes and abnormalities in gait, weakness, and headache.  Patient had MRI done on 2/21 with impression of normal exam with no change noted since 2018.   patient was admitted on 06/29/2020 after being found down by her daughter with slurred speech and confusion.  Patient was found to have a subarachnoid hemorrhage and posterior fossa felt to be aneurysmal in nature.  CT head/neck did not show a definite source of bleed but had poor visualization of proximal left ICA with tiny bulbous area and question of rupture or dissection.  Hemorrhage with surrounding edema in right frontal lobe region measuring 2.1x2 cm and was stable with follow-up CT.    Patient underwent cerebral angio with placement of right frontal ventricular drain and was negative  for AVM, aneurysm, fistula or dissection.  Hospital course has been positive for headaches with stiff neck and upper body spasms, development small hematoma along the course of drain in right frontal lobe as well as hypoglycemia.  As subarachnoid hemorrhage and IVH have resolved and intraventricular drain was removed on 4/6 as follow-up CT was without hydrocephalus.  Repeat cerebral angiogram on 4/4 was negative for aneurysm, AVM or fistula.  Patient with ongoing and continued cognitive deficits with reduced information processing speed confusion and difficulty with executive functioning and problem-solving.  Visual analysis and organization and visual information processing speed are very pronounced.  Her symptoms are quite consistent with right frontal involvement.  Patient was awake and alert but clearly showed anxiety and agitation when I came in.  She had a steady shaking to her leg and acknowledge being quite anxious and distressed.  The patient was able to  clearly describe some of the problems she is having with executive functioning and problem-solving most notably visual analysis and organization and visual-spatial/visual judgment deficits.  The patient reports that she has difficulty working out an Mining engineer, processing visual information on her phone and confusion understanding all of the complexities of her recent medical events.  Patient has good memory and recall of past medical events and previous follow-ups by neurology, rehab another issues related to residual effects of her connective tissue disease.  Patient is aware of deficits but the right frontal deficits make it hard for her to understand as etiology leading to greater frustration and worry.  Disposition/Plan:  Today we worked on helping the patient understand and cope with cognitive deficits and difficulties she is having and help her better understand the nature of these deficits.  The patient is having a hard time putting everything  together into a gestalt difficulty gaining any depth to her understanding of her condition.  This is causing a lot of anxiety and frustration and as she works on challenging issues in therapy and heightens her emotional distress.  We worked on her understanding and coping around these particular issues.  Diagnosis:   Frontal lobe and executive functioning deficits         Electronically Signed   _______________________ Ilean Skill, Psy.D. Clinical Neuropsychologist

## 2020-07-17 NOTE — Progress Notes (Signed)
Physical Therapy Session Note  Patient Details  Name: Amy Barry MRN: [id number] Date of Birth: [birth year]  Today's Date: 07/17/2020 PT Individual Time: 8250-5397 PT Individual Time Calculation (min): 58 min   Short Term Goals: Week 1:  PT Short Term Goal 1 (Week 1): Pt will transfer sup to sit w/ log roll and min A PT Short Term Goal 2 (Week 1): Pt will transfer sit to stand and SPT w/ min A PT Short Term Goal 3 (Week 1): Pt will amb 100' w/ RW and CGA. Week 2:     Skilled Therapeutic Interventions/Progress Updates:   Pt received supine in bed and agreeable to PT. Supine>sit transfer with min assist  At trunk for safety. Sitting balance EOB x 2 minutes with distant supervision assist for safety. Pt performed stand pivot transfer to [facility] with CGA and no AD. Pt transported to rehab gym in [facility]. Gait training with RW x 74f and CGA. Pt then performed gait training x 772fwhile weaving through 4 cones with supervision assist, RW, and min cues for attention to task.   Visual scanning sequence: sitting 1:3930m  Standing: 1:08 Visual scanning 2 min (1.6 sec response time sitting)  Memory: 100%, 9 trials. In standing.  Pt required CGA to complete Bits in standing with mild lateral lean to the R, but no overt LOB noted.   nustep BLE/BUE x 4 minutes with cues for increased step length in speed intermittently. Pt noted pain in bil ischial tuberosities, limiting increased time.   Pt returned to room and performed stand pivot transfer to bed with RW and CGA. Sit>supine completed with supervision assist, and left supine in bed with call bell in reach and all needs met.        Therapy Documentation Precautions:  Precautions Precautions: Fall Restrictions Weight Bearing Restrictions: No Vital Signs: Therapy Vitals Temp: 98.8 F (37.1 C) Pulse Rate: 84 Resp: 16 BP: 125/67 Patient Position (if appropriate): Lying Oxygen Therapy SpO2: 100 % O2 Device: Room Air Pain: Pain Assessment Pain  Scale: 0-10 Pain Score: 0-No pain   Therapy/Group: Individual Therapy  AusLorie Phenix15/2022, 4:59 PM

## 2020-07-17 NOTE — Progress Notes (Signed)
Occupational Therapy Session Note  Patient Details  Name: Amy Barry MRN: [id number] Date of Birth: September 25, 1970  Today's Date: 07/17/2020 OT Individual Time: 1300-1350 OT Individual Time Calculation (min): 50 min    Short Term Goals: Week 1:  OT Short Term Goal 1 (Week 1): Pt will complete LB dressing at sit<stand level with 1 assist OT Short Term Goal 1 - Progress (Week 1): Met OT Short Term Goal 2 (Week 1): Pt will complete UB dressing while sitting unsupported with no more than Min balance assist OT Short Term Goal 2 - Progress (Week 1): Met OT Short Term Goal 3 (Week 1): Pt will complete toilet transfer with 1 assist and LRAD OT Short Term Goal 3 - Progress (Week 1): Met  Skilled Therapeutic Interventions/Progress Updates:     Pt received in bed with minor HA at beginnin of session repositioned at end in bed for rest.  ADL:  Pt completes bathing with increased time to sequence through. Pt declines washing feet. Pt cued to sit on TTB while washing hair d/t balance/endurance defiicts.  Pt completes UB dressing with supervision with VC for pulling towel off of back prior to putting shirt on Pt completes LB dressing with CGA sit to stand from TTB with RW Pt completes footwear with total A. Educated on use of sock aide to don socks but d/t time constraint unable to demo/practice PT grooms seated at sink with MOD I Pt completes shower/Tub transfer with CGA using RW with VC for pushing up from EOB to stand to RW  Pt left at end of session in bed with exit alarm on, call light in reach and all needs met   Therapy Documentation Precautions:  Precautions Precautions: Fall Restrictions Weight Bearing Restrictions: No General:   Vital Signs: Therapy Vitals Temp: 98.9 F (37.2 C) Temp Source: Oral Pulse Rate: 75 Resp: 20 BP: 122/74 Patient Position (if appropriate): Sitting Oxygen Therapy SpO2: 100 % O2 Device: Room Air Pain: Pain Assessment Pain Score: 4   ADL: ADL Eating: Not assessed Grooming: Minimal assistance Where Assessed-Grooming: Edge of bed Upper Body Bathing: Moderate assistance Where Assessed-Upper Body Bathing: Edge of bed Lower Body Bathing: Maximal assistance Where Assessed-Lower Body Bathing: Bed level Upper Body Dressing: Moderate assistance Where Assessed-Upper Body Dressing: Edge of bed Lower Body Dressing: Maximal assistance Where Assessed-Lower Body Dressing: Bed level Toileting: Not assessed Toilet Transfer: Not assessed Tub/Shower Transfer: Not assessed Vision   Perception    Praxis   Exercises:   Other Treatments:     Therapy/Group: Individual Therapy  [name] 07/17/2020, 6:51 AM

## 2020-07-17 NOTE — Progress Notes (Signed)
PROGRESS NOTE   Subjective/Complaints: Had ongoing headaches yesterday. Seems  better this morning but still present. Pt complains of frontal and occipital pain. Appetite still poor. Hears "squishing" around right ear when she chews.   ROS: Patient denies fever, rash, sore throat, blurred vision, nausea, vomiting, diarrhea, cough, shortness of breath or chest pain, joint or back pain,   or mood change.   Objective:   No results found. No results for input(s): WBC, HGB, HCT, PLT in the last 72 hours. Recent Labs    07/16/20 0513  NA 132*  K 4.9  CL 102  CO2 23  GLUCOSE 108*  BUN 6  CREATININE 0.65  CALCIUM 9.6    Intake/Output Summary (Last 24 hours) at 07/17/2020 0900 Last data filed at 07/17/2020 0700 Gross per 24 hour  Intake 658 ml  Output --  Net 658 ml        Physical Exam: Vital Signs Blood pressure 122/74, pulse 75, temperature 98.9 F (37.2 C), temperature source Oral, resp. rate 20, height 5\' 4"  (1.626 m), weight 82.9 kg, last menstrual period 12/22/2017, SpO2 100 %.  Constitutional: No distress . Vital signs reviewed. HEENT: EOMI, oral membranes moist Neck: supple Cardiovascular: RRR without murmur. No JVD    Respiratory/Chest: CTA Bilaterally without wheezes or rales. Normal effort    GI/Abdomen: BS +, non-tender, non-distended Ext: no clubbing, cyanosis, or edema Psych: pleasant and cooperative Skin: scalp wounds healed. ?mild seroma in temporal area Neuro: Pt is cognitively appropriate with normal insight, memory, and awareness. Cranial nerves 2-12 are intact. Sensory exam is normal. Reflexes are 2+ in all 4's. Fine motor coordination is intact. No tremors. Motor function is grossly 5/5 in UE. LE: limited d/t pain proximall, 2/5 HF, KE and 4+/5 ADF/PF. Neuro exam unchanged 4/15 Musculoskeletal: Still bilateral thigh tenderness   Assessment/Plan: 1. Functional deficits which require 3+ hours per  day of interdisciplinary therapy in a comprehensive inpatient rehab setting.  Physiatrist is providing close team supervision and 24 hour management of active medical problems listed below.  Physiatrist and rehab team continue to assess barriers to discharge/monitor patient progress toward functional and medical goals  Care Tool:  Bathing    Body parts bathed by patient: Right arm,Left arm,Chest,Abdomen,Front perineal area,Buttocks,Right upper leg,Left upper leg,Face   Body parts bathed by helper: Right lower leg,Left lower leg     Bathing assist Assist Level: Minimal Assistance - Patient > 75%     Upper Body Dressing/Undressing Upper body dressing   What is the patient wearing?: Pull over shirt    Upper body assist Assist Level: Supervision/Verbal cueing    Lower Body Dressing/Undressing Lower body dressing      What is the patient wearing?: Underwear/pull up,Pants     Lower body assist Assist for lower body dressing: Minimal Assistance - Patient > 75%     Toileting Toileting Toileting Activity did not occur (Clothing management and hygiene only): N/A (no void or bm)  Toileting assist Assist for toileting: Contact Guard/Touching assist     Transfers Chair/bed transfer  Transfers assist     Chair/bed transfer assist level: Contact Guard/Touching assist     Locomotion Ambulation   Ambulation  assist      Assist level: Contact Guard/Touching assist Assistive device: Kugel-rolling Max distance: 15 ft   Walk 10 feet activity   Assist     Assist level: Contact Guard/Touching assist Assistive device: [place]-rolling   Walk 50 feet activity   Assist Walk 50 feet with 2 turns activity did not occur: Refused         Walk 150 feet activity   Assist Walk 150 feet activity did not occur: Refused         Walk 10 feet on uneven surface  activity   Assist Walk 10 feet on uneven surfaces activity did not occur: Safety/medical concerns          Wheelchair     Assist Will patient use wheelchair at discharge?: No Type of Wheelchair: Manual Wheelchair activity did not occur: Refused         Wheelchair 50 feet with 2 turns activity    Assist            Wheelchair 150 feet activity     Assist          Blood pressure 122/74, pulse 75, temperature 98.9 F (37.2 C), temperature source Oral, resp. rate 20, height 5\' 4"  (1.626 m), weight 82.9 kg, last menstrual period 12/22/2017, SpO2 100 %.  Medical Problem List and Plan: 1.  Impaired cognition/mobility and ADLs secondary to [facility] within basal cisterns and ventricular system-               -patient may shower               -ELOS/Goals: 14-18 days- supervision to min A  -Continue CIR therapies including PT, OT, and SLP       -may have grounds pass to see dog  2.  Antithrombotics: -DVT/anticoagulation:  Mechanical: Sequential compression devices, below knee Bilateral lower extremities             -antiplatelet therapy: N/A 3. Pain Management: Was using NSAIDS tid at home             --will continue Oxycodone as needed  -gabapentin/kpad for myalgias, pain per home regimen   -on scheduled robaxin 750mg  qid  4/15 topamax seems to have helped. Will increase to 25mg  bid 4. Mood: LCSW to follow for evaluation and support.                         -antipsychotic agents: N/A 5. Neuropsych: This patient is not fully capable of making decisions on her own behalf. 6. Skin/Wound Care: Routine pressure-relief measures. Should be able to remove vent clips RIgh tfrontal in am  7. Fluids/Electrolytes/Nutrition:    -hypokalemia: potassium up to 4.9 4/14, can dc supplement 8. Hyponatremia:  sodium 130 4/11             --continue 1500 cc FR              -continue salt tabs  -Na+ 132 4/14---continue same plan---recheck monday 9.  Seizure prophylaxis: Continue Keppra twice daily.   10. HTN/Tachycardia: Monitor blood pressures 3 times daily.             --Continue  Nimotop every 4 hours to prevent vasospasms.             --Continue metoprolol and amlodipine                --lasix held d/t hyponatremia--continue to hold.  -4/15 bp controlled 12.  Connective tissue  disorders: Followed by Dr. Amil Amen. (Had intermittent weakness with RLE with foot drop progressing to left sided weakness--needed therapy X 2 years for recovery)             --  methotrexate--d'ced per patient request.             --now off plaquenil also  .                 13.  Stress related hyperglycemia: Hgb A1c-5.4.                -sliding scale insulin for elevated blood sugars.  -cbg's well controlled 14.  Hypocalcemia: Added calcium for oral supplementation 15. Bowel incontinence: now off plaquenil which causes her loose stool  -stopped softeners and laxatives  -added probiotic, fiber  -up to toilet to empty  -added questran to help with stool consistency  -4/15 stools now mushy, improving 16. Fevers: afebrile    -CXR negative 4/9.  -continue to follow   LOS: 8 days A FACE TO FACE EVALUATION WAS PERFORMED  Meredith Staggers 07/17/2020, 9:00 AM

## 2020-07-17 NOTE — Progress Notes (Signed)
Speech Language Pathology Weekly Progress and Session Note  Patient Details  Name: Amy Barry MRN: [id number] Date of Birth: 22-Oct-1970  Beginning of progress report period: July 09, 2020 End of progress report period: July 16, 2020  Today's Date: 07/17/2020 SLP Individual Time: 0815-0900 SLP Individual Time Calculation (min): 45 min  Short Term Goals: Week 1: SLP Short Term Goal 1 (Week 1): Patient will recall at least one activity completed in each of the therapy disciplines (PT, OT, ST) and at least one intervention from nursing with  [place]. SLP Short Term Goal 1 - Progress (Week 1): Met SLP Short Term Goal 2 (Week 1): Patient will answer delayed recall questions after listening to short (few sentence paragraph level) text with 80% accuracy and minA. SLP Short Term Goal 2 - Progress (Week 1): Discontinued (comment) SLP Short Term Goal 3 (Week 1): Patient will summarize after SLP read aloud of 3-4 sentence paragraph with 75% accuracy and modA. SLP Short Term Goal 3 - Progress (Week 1): Discontinued (comment) SLP Short Term Goal 4 (Week 1): Patient will utilize strategies and environmental cues to orient to time of day, date and recall daily activities completed with minA cues. SLP Short Term Goal 4 - Progress (Week 1): Met    New Short Term Goals: Week 2: SLP Short Term Goal 1 (Week 2): STGs=LTGS due to ELOS  Weekly Progress Updates: Patient continues to make functional gains and has met all STGs that have been addressed this reporting period. Currently, patient requires overall supervision-Min A verbal cues to complete functional and familiar tasks safely in regards to problem solving, recall with use of strategies, awareness and attention. Patient and family education ongoing. Patient would benefit from continued skilled SLP intervention to maximize her cognitive functioning and overall functional independence prior to discharge.      Intensity: Minumum of 1-2 x/day, 30 to 90  minutes Frequency: 3 to 5 out of 7 days Duration/Length of Stay: 07/24/20 Treatment/Interventions: Cognitive remediation/compensation;Internal/external aids;Medication managment;Patient/family education;Therapeutic Activities;Cueing hierarchy;Functional tasks;Environmental controls   Daily Session  Skilled Therapeutic Interventions:  Skilled treatment session focused on cognitive goals. SLP facilitated session by providing extra time and overall Min A verbal cues for organization and problem solving during a complex scheduling task. Patient reported mental fatigue after completion of task and SLP provided education regarding importance of "brain breaks" throughout the day with decreased stimulation. Patient verbalized understanding. Patient left upright in bed with alarm on and all needs within reach. Continue with current plan of care.     Pain Pain Assessment Pain Scale: 0-10 Pain Score: 3  Pain Type: Acute pain Pain Location: Head Pain Descriptors / Indicators: Aching Pain Frequency: Intermittent Pain Onset: Gradual Patients Stated Pain Goal: 2 Pain Intervention(s): Medication (See eMAR) Multiple Pain Sites: No  Therapy/Group: Individual Therapy  Jaya Lapka 07/17/2020, 1:26 PM

## 2020-07-18 DIAGNOSIS — I609 Nontraumatic subarachnoid hemorrhage, unspecified: Secondary | ICD-10-CM | POA: Diagnosis not present

## 2020-07-18 LAB — GLUCOSE, CAPILLARY
Glucose-Capillary: 127 mg/dL — ABNORMAL HIGH (ref 70–99)
Glucose-Capillary: 80 mg/dL (ref 70–99)
Glucose-Capillary: 118 mg/dL — ABNORMAL HIGH (ref 70–99)
Glucose-Capillary: 106 mg/dL — ABNORMAL HIGH (ref 70–99)

## 2020-07-18 MED ORDER — TOPIRAMATE 25 MG PO TABS
50.0000 mg | ORAL_TABLET | Freq: Two times a day (BID) | ORAL | Status: DC
  Administered 2020-07-18 – 2020-07-19 (×2): 50 mg via ORAL
  Filled 2020-07-18 (×2): qty 2

## 2020-07-18 MED ORDER — LIDOCAINE 5 % EX PTCH: 1.0000 | MEDICATED_PATCH | CUTANEOUS | Status: DC

## 2020-07-18 MED ORDER — DICLOFENAC SODIUM 1 % EX GEL
2.0000 g | Freq: Four times a day (QID) | CUTANEOUS | Status: DC
  Administered 2020-07-18 – 2020-07-23 (×10): 2 g via TOPICAL
  Filled 2020-07-18 (×2): qty 100

## 2020-07-18 NOTE — Progress Notes (Signed)
Occupational Therapy Session Note  Patient Details  Name: Amy Barry MRN: [id number] Date of Birth: June 12, 1970  Today's Date: 07/18/2020 OT Individual Time: 1829-9371 OT Individual Time Calculation (min): 57 min    Short Term Goals: Week 2:  OT Short Term Goal 1 (Week 2): LTG=STG 2/2 ELOS  Skilled Therapeutic Interventions/Progress Updates:    Pt greeted seated on commode in bathroom with nurse tech present. Handoff to OT for therapy session. Pt agreeable to shower. PT reports being extremely tired this am. Pt ambulates to get into the shower with RW and close supervision but is shaky when standing. When going to turn on water, pt forgot multiple times which direction the hot or cold water was to adjust water temperature. Overall CGA for bathing as pt stood to wash buttocks and peri-area. Stand-pivot out of shower with supervision. Pt needed min A to don brief and close supervision when standing to pull it up. Will introduce reacher next session. Gown donned until family arrives with patients clothing. Pt needed increased time and rest breaks for all BADL tasks 2/2 fatigue. Pt returned to bed at end of session and left semi-reclined in bed with needs met.   Therapy Documentation Precautions:  Precautions Precautions: Fall Restrictions Weight Bearing Restrictions: No Pain: Pain Assessment Pain Score: 6  Rest and repositioned for comfort  Therapy/Group: Individual Therapy  Valma Cava 07/18/2020, 9:01 AM

## 2020-07-18 NOTE — Progress Notes (Signed)
PROGRESS NOTE   Subjective/Complaints: C/o continued headache, pain in her legs and feet. Has been using one lidocaine patch on neck/posterior head, one on hip. Add voltaren gel. Increase Topamax to 50mg  BID No other complaints   ROS: Patient denies fever, rash, sore throat, blurred vision, nausea, vomiting, diarrhea, cough, shortness of breath or chest pain, joint or back pain, or mood change.   Objective:   No results found. No results for input(s): WBC, HGB, HCT, PLT in the last 72 hours. Recent Labs    07/16/20 0513  NA 132*  K 4.9  CL 102  CO2 23  GLUCOSE 108*  BUN 6  CREATININE 0.65  CALCIUM 9.6    Intake/Output Summary (Last 24 hours) at 07/18/2020 1232 Last data filed at 07/18/2020 0848 Gross per 24 hour  Intake 120 ml  Output --  Net 120 ml        Physical Exam: Vital Signs Blood pressure 110/64, pulse 79, temperature 98.7 F (37.1 C), temperature source Oral, resp. rate 18, height 5\' 4"  (1.626 m), weight 82.9 kg, last menstrual period 12/22/2017, SpO2 99 %.  Gen: no distress, normal appearing HEENT: oral mucosa pink and moist, NCAT Cardio: Reg rate Chest: normal effort, normal rate of breathing Abd: soft, non-distended Ext: no edema Psych: pleasant, normal affect Skin: scalp wounds healed. ?mild seroma in temporal area Neuro: Pt is cognitively appropriate with normal insight, memory, and awareness. Cranial nerves 2-12 are intact. Sensory exam is normal. Reflexes are 2+ in all 4's. Fine motor coordination is intact. No tremors. Motor function is grossly 5/5 in UE. LE: limited d/t pain proximall, 2/5 HF, KE and 4+/5 ADF/PF. Neuro exam unchanged 4/15 Musculoskeletal: Still bilateral thigh tenderness   Assessment/Plan: 1. Functional deficits which require 3+ hours per day of interdisciplinary therapy in a comprehensive inpatient rehab setting.  Physiatrist is providing close team supervision and 24  hour management of active medical problems listed below.  Physiatrist and rehab team continue to assess barriers to discharge/monitor patient progress toward functional and medical goals  Care Tool:  Bathing    Body parts bathed by patient: Right arm,Left arm,Chest,Abdomen,Front perineal area,Buttocks,Right upper leg,Left upper leg,Face   Body parts bathed by helper: Right lower leg,Left lower leg     Bathing assist Assist Level: Minimal Assistance - Patient > 75%     Upper Body Dressing/Undressing Upper body dressing   What is the patient wearing?: Pull over shirt    Upper body assist Assist Level: Supervision/Verbal cueing    Lower Body Dressing/Undressing Lower body dressing      What is the patient wearing?: Underwear/pull up,Pants     Lower body assist Assist for lower body dressing: Minimal Assistance - Patient > 75%     Toileting Toileting Toileting Activity did not occur (Clothing management and hygiene only): N/A (no void or bm)  Toileting assist Assist for toileting: Contact Guard/Touching assist     Transfers Chair/bed transfer  Transfers assist     Chair/bed transfer assist level: Contact Guard/Touching assist     Locomotion Ambulation   Ambulation assist      Assist level: Contact Guard/Touching assist Assistive device: [employer]ominicis-rolling Max distance: 15 ft  Walk 10 feet activity   Assist     Assist level: Contact Guard/Touching assist Assistive device: Boehme-rolling   Walk 50 feet activity   Assist Walk 50 feet with 2 turns activity did not occur: Refused         Walk 150 feet activity   Assist Walk 150 feet activity did not occur: Refused         Walk 10 feet on uneven surface  activity   Assist Walk 10 feet on uneven surfaces activity did not occur: Safety/medical concerns         Wheelchair     Assist Will patient use wheelchair at discharge?: No Type of Wheelchair: Manual Wheelchair activity did  not occur: Refused         Wheelchair 50 feet with 2 turns activity    Assist            Wheelchair 150 feet activity     Assist          Blood pressure 110/64, pulse 79, temperature 98.7 F (37.1 C), temperature source Oral, resp. rate 18, height 5\' 4"  (1.626 m), weight 82.9 kg, last menstrual period 12/22/2017, SpO2 99 %.  Medical Problem List and Plan: 1.  Impaired cognition/mobility and ADLs secondary to [facility] within basal cisterns and ventricular system-               -patient may shower               -ELOS/Goals: 14-18 days- supervision to min A  -Continue CIR therapies including PT, OT, and SLP       -may have grounds pass to see dog  2.  Antithrombotics: -DVT/anticoagulation:  Mechanical: Sequential compression devices, below knee Bilateral lower extremities             -antiplatelet therapy: N/A 3. Pain Management: Was using NSAIDS tid at home             --will continue Oxycodone as needed  -gabapentin/kpad for myalgias, pain per home regimen   -on scheduled robaxin 750mg  qid  Increase topmax to 50mg  BID and add voltaren gel.  4. Mood: LCSW to follow for evaluation and support.                         -antipsychotic agents: N/A 5. Neuropsych: This patient is not fully capable of making decisions on her own behalf. 6. Skin/Wound Care: Routine pressure-relief measures. Should be able to remove vent clips RIgh tfrontal  7. Fluids/Electrolytes/Nutrition:    -hypokalemia: potassium up to 4.9 4/14, can dc supplement 8. Hyponatremia:  sodium 130 4/11             --continue 1500 cc FR              -continue salt tabs  -Na+ 132 4/14---continue same plan---recheck monday 9.  Seizure prophylaxis: Continue Keppra twice daily.   10. HTN/Tachycardia: Monitor blood pressures 3 times daily.             --continue Nimotop every 4 hours to prevent vasospasms.             --Continue metoprolol and amlodipine                --lasix held d/t hyponatremia--continue to  hold.  -4/16 bp controlled 12.  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 (Had intermittent weakness with RLE with foot drop progressing to left sided weakness--needed therapy X 2 years for  recovery)             --  methotrexate--d'ced per patient request.             --now off plaquenil also  .   13.  Stress related hyperglycemia: Hgb A1c-5.4.                -sliding scale insulin for elevated blood sugars.  -cbg's 80-127: continue to monitor.  14.  Hypocalcemia: Added calcium for oral supplementation 15. Bowel incontinence: now off plaquenil which causes her loose stool  -stopped softeners and laxatives  -continue probiotic, fiber  -up to toilet to empty  -added questran to help with stool consistency  -4/15 stools now mushy, improving 16. Fevers: afebrile    -CXR negative 4/9.  -continue to follow   LOS: 9 days A FACE TO FACE EVALUATION WAS PERFORMED  Clide Deutscher Jory Tanguma 07/18/2020, 12:32 PM

## 2020-07-19 DIAGNOSIS — I609 Nontraumatic subarachnoid hemorrhage, unspecified: Secondary | ICD-10-CM | POA: Diagnosis not present

## 2020-07-19 LAB — GLUCOSE, CAPILLARY
Glucose-Capillary: 96 mg/dL (ref 70–99)
Glucose-Capillary: 90 mg/dL (ref 70–99)
Glucose-Capillary: 105 mg/dL — ABNORMAL HIGH (ref 70–99)
Glucose-Capillary: 121 mg/dL — ABNORMAL HIGH (ref 70–99)

## 2020-07-19 MED ORDER — LIDOCAINE 5 % EX PTCH
1.0000 | MEDICATED_PATCH | CUTANEOUS | Status: DC
  Administered 2020-07-19 – 2020-07-23 (×6): 1 via TRANSDERMAL
  Filled 2020-07-19 (×5): qty 1

## 2020-07-19 MED ORDER — TOPIRAMATE 25 MG PO TABS
75.0000 mg | ORAL_TABLET | Freq: Two times a day (BID) | ORAL | Status: DC
  Administered 2020-07-19 – 2020-07-21 (×4): 75 mg via ORAL
  Filled 2020-07-19 (×4): qty 3

## 2020-07-19 NOTE — Progress Notes (Signed)
Physical Therapy Weekly Progress Note  Patient Details  Name: Amy Barry MRN: [id number] Date of Birth: 1970/12/13  Beginning of progress report period: July 10, 2020 End of progress report period: July 19, 2020  Today's Date: 07/19/2020 PT Individual Time: 1120-1210 PT Individual Time Calculation (min): 50 min   Patient has met 3 of 3 short term goals.  Patient with slow but steady progress, limited by headache, lower extremity pain, and fatigue this week. Patient currently performs bed mobility with min A-CGA, transfers with CGA-sueprvision, gait >100 feet with CGA using RW, and 8 steps using L rail with CGA.   Patient continues to demonstrate the following deficits muscle weakness, decreased cardiorespiratoy endurance, unbalanced muscle activation, decreased awareness and decreased problem solving and decreased sitting balance, decreased standing balance, decreased postural control and decreased balance strategies and therefore will continue to benefit from skilled PT intervention to increase functional independence with mobility.  Patient progressing toward long term goals.  Plan of care revisions: Downgraded ambulation goals to CGA for safety due to intermittnet knee buckling..  PT Short Term Goals Week 1:  PT Short Term Goal 1 (Week 1): Pt will transfer sup to sit w/ log roll and min A PT Short Term Goal 1 - Progress (Week 1): Met PT Short Term Goal 2 (Week 1): Pt will transfer sit to stand and SPT w/ min A PT Short Term Goal 2 - Progress (Week 1): Met PT Short Term Goal 3 (Week 1): Pt will amb 100' w/ RW and CGA. PT Short Term Goal 3 - Progress (Week 1): Met Week 2:  PT Short Term Goal 1 (Week 2): STG=LTG due to ELOS.  Skilled Therapeutic Interventions/Progress Updates:     Patient in w/c with her husband and 2 daughters present for family education upon PT arrival. Patient alert and agreeable to PT session. Patient reported 4-5/10 headache and lower extremity pain during  session, reports that she received pain medicine prior to session. PT provided repositioning, rest breaks, and distraction as pain interventions throughout session.   Therapeutic Activity: Bed Mobility: Patient performed supine to/from sit with supervision in a flat bed without use of bed rails. Patient's husband provided cues for setting bottom elbow to push up to sitting from side-lying. Transfers: Patient performed sit to/from stand x3 and stand pivot x1 with supervision using RW. Patient prefers to using B upper extremities on RW due to low back/leg pain with transfers, performed safety and educated on risk of LOB if hand placement is too far back on the RW, patient and family stated understanding.  Gait Training:  Patient ambulated 52 feet using RW with CGA-close supervision provided by her husband. Ambulated with decreased step length and height, decreased DF on L, increased R circumduction, and intermittent R knee instability requiring CGA to steady. Patient's husband provided cues for safe management of RW, proximity to RW, and safe turning technique. Patient's family reports that the patient's gait is similar to a "bad day" at baseline and they feel comfortable assisting her at a CGA level at d/c.  Patient ascended/descended 8-6" steps using B rails with CGA. Performed step-to gait pattern leading with R while ascending and L while descending. Assist provided by her husband, who also instructed patient in [place]. Provided education on placing a chair at the landing between the 2 sets of 7 steps for energy conservation and for staying close to the patient and having a second person or opting to stay down stairs/up stairs with increased fatigue or  pain. Patient and her husband stated understanding.   Wheelchair Mobility:  Patient propelled wheelchair ~30 feet x2 with B upper extremities, limited by fatigue. Provided verbal cues for turning technique. Discussed use of w/c for longer community  distances. Patient's husband very resistant to this and stated he wanted to work with her on transitioning to the [employer] for outdoor and community ambulation. PT educated on patient's increased fall risk with use of a Rollator at this time. Patient asked about use of a transport chair for community mobility. Considering the patient's fatigue levels a transport chair could provide appropriate energy conservation in community settings. Will discuss with therapy team.   Patient required increased time and rest breaks due to fatigue and distraction with her family present. Patient participated in a motivational discussion and prayer with another patient in the gym during session. This appeared to be emotional for her as she teared up during and after.   Patient's family reports that patient is nearly at baseline level of mobility and that they feel very confident in being able to assist her. Patient declined car transfer due to fatigue and reviewed safety with transfer to sedan height vehicle, patient and family stated understanding. Educated on fall risk/prevention, home modifications to prevent falls, and activation of emergency services in the event of a fall during session.   Patient in bed with her family in the room at end of session with breaks locked, bed alarm set, and all needs within reach.    Therapy Documentation Precautions:  Precautions Precautions: Fall Restrictions Weight Bearing Restrictions: No  Therapy/Group: Individual Therapy  Benigna Delisi L Raiden Yearwood PT, DPT  07/19/2020, 3:43 PM

## 2020-07-19 NOTE — Plan of Care (Signed)
  Problem: RH Ambulation Goal: LTG Patient will ambulate in controlled environment (PT) Description: LTG: Patient will ambulate in a controlled environment, # of feet with assistance (PT). Flowsheets Taken 07/19/2020 1600 by Tyeisha Dinan L, PT LTG: Pt will ambulate in controlled environ  assist needed:: (downgraded goal for patient safety due to lower extremity instability with gait.) Contact Guard/Touching assist Taken 07/10/2020 1229 by Ladoris Gene, PT LTG: Ambulation distance in controlled environment: 150 Note: downgraded goal for patient safety due to lower extremity instability with gait. Goal: LTG Patient will ambulate in home environment (PT) Description: LTG: Patient will ambulate in home environment, # of feet with assistance (PT). Flowsheets Taken 07/19/2020 1600 by Jacere Pangborn L, PT LTG: Pt will ambulate in home environ  assist needed:: (downgraded goal for patient safety due to lower extremity instability with gait.) Contact Guard/Touching assist Taken 07/10/2020 1229 by Ladoris Gene, PT LTG: Ambulation distance in home environment: 50 Note: downgraded goal for patient safety due to lower extremity instability with gait.   Problem: RH Stairs Goal: LTG Patient will ambulate up and down stairs w/assist (PT) Description: LTG: Patient will ambulate up and down # of stairs with assistance (PT) Flowsheets (Taken 07/19/2020 1600) LTG: Pt will ambulate up/down stairs assist needed:: (downgraded goal for patient safety due to lower extremity instability with gait.) Contact Guard/Touching assist LTG: Pt will  ambulate up and down number of stairs: 7+7 with B rails for bedroom access Note: downgraded goal for patient safety due to lower extremity instability with gait.

## 2020-07-19 NOTE — Progress Notes (Signed)
Occupational Therapy Session Note  Patient Details  Name: Amy Barry MRN: [id number] Date of Birth: [birth year]  Today's Date: 07/19/2020 OT Individual Time: 1000-1045 OT Individual Time Calculation (min): 45 min    Short Term Goals: Week 2:  OT Short Term Goal 1 (Week 2): LTG=STG 2/2 ELOS  Skilled Therapeutic Interventions/Progress Updates:    Pt resting in bed upon arrival. Husband and two daughters present for education. All family members state they have been assisting pt off and on for approx 4 years. OT intervention with focus on functional amb with RW, functional transfers, toileting, bathing at shower level, and dressing with sit<>stand from EOB. Functional amb and transfers with CGA. Pt requires assistance bathing Bil feet, threading pants over BLE, and donning socks. Family states they have been assisting periodically when at home. Pt showers in garden tub at home with [facility] placed in tub. Therapist expressed concern about stepping over into tub. Family states that the arrangement works good. Recommended 24 hour supervision/assistance at discharge.   Therapy Documentation Precautions:  Precautions Precautions: Fall Restrictions Weight Bearing Restrictions: No   Pain: Pain Assessment Pain Scale: 0-10 Pain Score: 4  Pain Type: Acute pain Pain Location: Head Pain Orientation: Posterior Pain Descriptors / Indicators: Aching;Headache Pain Frequency: Intermittent Pain Onset: On-going Patients Stated Pain Goal: 2 Pain Intervention(s): RN aware and premedicated, shower  Therapy/Group: Individual Therapy  Leroy Libman 07/19/2020, 11:47 AM

## 2020-07-19 NOTE — Progress Notes (Signed)
PROGRESS NOTE   Subjective/Complaints: Continues to have headache and leg pain. Discussed pain can occur as brain bleed resorbs. Discussed further increasing Topamax and she is agreeable. She asks about third lidocaine patch- two for either side of neck and one for right lower back, ordered   ROS: Patient denies fever, rash, sore throat, blurred vision, nausea, vomiting, diarrhea, cough, shortness of breath or chest pain, joint or back pain, or mood change.   Objective:   No results found. No results for input(s): WBC, HGB, HCT, PLT in the last 72 hours. No results for input(s): NA, K, CL, CO2, GLUCOSE, BUN, CREATININE, CALCIUM in the last 72 hours.  Intake/Output Summary (Last 24 hours) at 07/19/2020 1455 Last data filed at 07/19/2020 0800 Gross per 24 hour  Intake 660 ml  Output --  Net 660 ml        Physical Exam: Vital Signs Blood pressure 120/81, pulse 82, temperature 98.8 F (37.1 C), temperature source Oral, resp. rate 18, height 5\' 4"  (1.626 m), weight 82.9 kg, last menstrual period 12/22/2017, SpO2 100 %. Gen: no distress, normal appearing HEENT: oral mucosa pink and moist, NCAT Cardio: Reg rate Chest: normal effort, normal rate of breathing Abd: soft, non-distended Ext: no edema Psych: pleasant, normal affect Skin: scalp wounds healed. ?mild seroma in temporal area Neuro: Pt is cognitively appropriate with normal insight, memory, and awareness. Cranial nerves 2-12 are intact. Sensory exam is normal. Reflexes are 2+ in all 4's. Fine motor coordination is intact. No tremors. Motor function is grossly 5/5 in UE. LE: limited d/t pain proximall, 2/5 HF, KE and 4+/5 ADF/PF. Neuro exam unchanged 4/15 Musculoskeletal: Still bilateral thigh tenderness   Assessment/Plan: 1. Functional deficits which require 3+ hours per day of interdisciplinary therapy in a comprehensive inpatient rehab setting.  Physiatrist is  providing close team supervision and 24 hour management of active medical problems listed below.  Physiatrist and rehab team continue to assess barriers to discharge/monitor patient progress toward functional and medical goals  Care Tool:  Bathing    Body parts bathed by patient: Right arm,Left arm,Chest,Abdomen,Front perineal area,Buttocks,Right upper leg,Left upper leg,Face   Body parts bathed by helper: Right lower leg,Left lower leg     Bathing assist Assist Level: Minimal Assistance - Patient > 75%     Upper Body Dressing/Undressing Upper body dressing   What is the patient wearing?: Pull over shirt    Upper body assist Assist Level: Supervision/Verbal cueing    Lower Body Dressing/Undressing Lower body dressing      What is the patient wearing?: Pants     Lower body assist Assist for lower body dressing: Minimal Assistance - Patient > 75%     Toileting Toileting Toileting Activity did not occur (Clothing management and hygiene only): N/A (no void or bm)  Toileting assist Assist for toileting: Contact Guard/Touching assist     Transfers Chair/bed transfer  Transfers assist     Chair/bed transfer assist level: Contact Guard/Touching assist     Locomotion Ambulation   Ambulation assist      Assist level: Contact Guard/Touching assist Assistive device: [place]-rolling Max distance: 15 ft   Walk 10 feet activity  Assist     Assist level: Contact Guard/Touching assist Assistive device: [place]-rolling   Walk 50 feet activity   Assist Walk 50 feet with 2 turns activity did not occur: Refused         Walk 150 feet activity   Assist Walk 150 feet activity did not occur: Refused         Walk 10 feet on uneven surface  activity   Assist Walk 10 feet on uneven surfaces activity did not occur: Safety/medical concerns         Wheelchair     Assist Will patient use wheelchair at discharge?: No Type of Wheelchair:  Manual Wheelchair activity did not occur: Refused         Wheelchair 50 feet with 2 turns activity    Assist            Wheelchair 150 feet activity     Assist          Blood pressure 120/81, pulse 82, temperature 98.8 F (37.1 C), temperature source Oral, resp. rate 18, height 5\' 4"  (1.626 m), weight 82.9 kg, last menstrual period 12/22/2017, SpO2 100 %.  Medical Problem List and Plan: 1.  Impaired cognition/mobility and ADLs secondary to [facility] within basal cisterns and ventricular system-               -patient may shower               -ELOS/Goals: 14-18 days- supervision to min A  -Continue CIR therapies including PT, OT, and SLP       -may have grounds pass to see dog  2.  Antithrombotics: -DVT/anticoagulation:  Mechanical: Sequential compression devices, below knee Bilateral lower extremities             -antiplatelet therapy: N/A 3. Pain Management: Was using NSAIDS tid at home             --will continue Oxycodone as needed  -gabapentin/kpad for myalgias, pain per home regimen   -on scheduled robaxin 750mg  qid  Increase topmax to 75mg  BID and add voltaren gel.  4. Mood: LCSW to follow for evaluation and support.                         -antipsychotic agents: N/A 5. Neuropsych: This patient is not fully capable of making decisions on her own behalf. 6. Skin/Wound Care: Routine pressure-relief measures. Should be able to remove vent clips RIgh tfrontal  7. Fluids/Electrolytes/Nutrition:    -hypokalemia: potassium up to 4.9 4/14, can dc supplement 8. Hyponatremia:  sodium 130 4/11             --continue 1500 cc FR              -continue salt tabs  -Na+ 132 4/14---continue same plan---recheck monday 9.  Seizure prophylaxis: Continue Keppra twice daily.   10. HTN/Tachycardia: Monitor blood pressures 3 times daily.             --continue Nimotop every 4 hours to prevent vasospasms.             --Continue metoprolol and amlodipine                --lasix held  d/t hyponatremia--continue to hold.  -4/16 bp controlled 12.  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 (Had intermittent weakness with RLE with foot drop progressing to left sided weakness--needed therapy X 2 years for recovery)             --  methotrexate--d'ced per patient request.             --now off plaquenil also  .   13.  Stress related hyperglycemia: Hgb A1c-5.4.                -sliding scale insulin for elevated blood sugars.  -cbg's 80-127: continue to monitor.  14.  Hypocalcemia: Continue calcium for oral supplementation 15. Bowel incontinence: now off plaquenil which causes her loose stool  -stopped softeners and laxatives  -continue probiotic, fiber. Discussed high fiber foods that her family can bring in for her  -up to toilet to empty  -added questran to help with stool consistency  -4/15 stools now mushy, improving 16. Fevers: afebrile    -CXR negative 4/9.  -continue to follow  17. Disposition: husband and 2 daughters are present for family caregiver training.   LOS: 10 days A FACE TO FACE EVALUATION WAS PERFORMED  Amy Barry 07/19/2020, 2:55 PM

## 2020-07-20 ENCOUNTER — Encounter (HOSPITAL_COMMUNITY): Payer: Self-pay | Admitting: Radiology

## 2020-07-20 DIAGNOSIS — I10 Essential (primary) hypertension: Secondary | ICD-10-CM | POA: Diagnosis not present

## 2020-07-20 DIAGNOSIS — I609 Nontraumatic subarachnoid hemorrhage, unspecified: Secondary | ICD-10-CM | POA: Diagnosis not present

## 2020-07-20 DIAGNOSIS — E876 Hypokalemia: Secondary | ICD-10-CM | POA: Diagnosis not present

## 2020-07-20 DIAGNOSIS — E871 Hypo-osmolality and hyponatremia: Secondary | ICD-10-CM | POA: Diagnosis not present

## 2020-07-20 LAB — BASIC METABOLIC PANEL
Anion gap: 8 (ref 5–15)
BUN: 5 mg/dL — ABNORMAL LOW (ref 6–20)
CO2: 17 mmol/L — ABNORMAL LOW (ref 22–32)
Calcium: 9.5 mg/dL (ref 8.9–10.3)
Chloride: 108 mmol/L (ref 98–111)
Creatinine, Ser: 0.67 mg/dL (ref 0.44–1.00)
GFR, Estimated: >60 mL/min (ref >60)
Glucose, Bld: 86 mg/dL (ref 70–99)
Potassium: 3.8 mmol/L (ref 3.5–5.1)
Sodium: 133 mmol/L — ABNORMAL LOW (ref 135–145)

## 2020-07-20 LAB — CBC
HCT: 34.6 % — ABNORMAL LOW (ref 36.0–46.0)
Hemoglobin: 11.3 g/dL — ABNORMAL LOW (ref 12.0–15.0)
MCH: 31.7 pg (ref 26.0–34.0)
MCHC: 32.7 g/dL (ref 30.0–36.0)
MCV: 97.2 fL (ref 80.0–100.0)
Platelets: 352 10*3/uL (ref 150–400)
RBC: 3.56 MIL/uL — ABNORMAL LOW (ref 3.87–5.11)
RDW: 12 % (ref 11.5–15.5)
WBC: 4.6 10*3/uL (ref 4.0–10.5)
nRBC: 0 % (ref 0.0–0.2)

## 2020-07-20 LAB — GLUCOSE, CAPILLARY
Glucose-Capillary: 93 mg/dL (ref 70–99)
Glucose-Capillary: 100 mg/dL — ABNORMAL HIGH (ref 70–99)
Glucose-Capillary: 114 mg/dL — ABNORMAL HIGH (ref 70–99)

## 2020-07-20 NOTE — Progress Notes (Signed)
Patient ID: Amy Barry, female   DOB: June 22, 1970, 50 y.o.   MRN: [id number]  SW left HHA list in room with pt and pt dtr, both were half-sleep when SW came in room. SW stated will follow-up tomorrow to discuss further.   Loralee Pacas, MSW, [place] Office: [phone] Cell: [phone] Fax: [phone]

## 2020-07-20 NOTE — Progress Notes (Signed)
Occupational Therapy Session Note  Patient Details  Name: Amy Barry MRN: [id number] Date of Birth: 08/13/1970  Today's Date: 07/20/2020 OT Individual Time: 0745-0830 OT Individual Time Calculation (min): 45 min  and Today's Date: 07/20/2020 OT Missed Time: 15 Minutes Missed Time Reason: Other (comment) (eating breakfast)   Short Term Goals: Week 2:  OT Short Term Goal 1 (Week 2): LTG=STG 2/2 ELOS  Skilled Therapeutic Interventions/Progress Updates:    Pt greeted semi-reclined in bed finishing breakfast. Pt requested 10 more minutes to finish eating. OT returned and pt agreeable to OT treatment focused on self-care retraining. Pt completed bed mobility with supervision. She reported need to go to the bathroom. Min cues for hand placement with sit<>stand, then CGA to ambulate into bathroom w/RW. Pt with continent void of bowel and bladder. Pt completed peri-care with increased time and set-up A. OT issued reacher and sock-aid. OT provided education and demonstration for device use, then had pt practice. Pt was able to thread B LEs into pants and use sock aid to don socks with increased time and min verbal cues for technique. Worked on standing balance/endurance for standing toothbrushing and facewashing tasks-over 5 minutes of standing. Pt ambulated back to bed at end of session w/ RW and supervision. Pt left semi-reclined in bed with bed alarm on, call bell in reach, and needs met.   Therapy Documentation Precautions:  Precautions Precautions: Fall Restrictions Weight Bearing Restrictions: No General: General OT Amount of Missed Time: 15 Minutes Pain: Pt reports slight headache, but stated it was manageable  Therapy/Group: Individual Therapy   S  07/20/2020, 8:40 AM  

## 2020-07-20 NOTE — Progress Notes (Signed)
Speech Language Pathology Daily Session Note  Patient Details  Name: Amy Barry MRN: [id number] Date of Birth: 1970/08/04  Today's Date: 07/20/2020 SLP Individual Time: 1117-1200 SLP Individual Time Calculation (min): 43 min  Short Term Goals: Week 2: SLP Short Term Goal 1 (Week 2): STGs=LTGS due to ELOS  Skilled Therapeutic Interventions:Skilled ST services focused on cognitive skills. Pt required extra time and cold compress to aid in alertness. SLP facilitated complex problem solving skills in familiar scheduling tasks, however possibility impacted by lethargy pt required max A verbal cues fading to mod A verbal cues with each sentence broken down step by step. Pt became emotional about cognitive deficits and SLP provided support/education pertaining to neuro rehabilitation. Pt was left in room with call bell within reach and bed alarm set. SLP recommends to continue skilled services.     Pain Pain Assessment Pain Score: 0-No pain  Therapy/Group: Individual Therapy  Bret Stamour  [facility] 07/20/2020, 4:48 PM

## 2020-07-20 NOTE — Progress Notes (Signed)
Physical Therapy Session Note  Patient Details  Name: Amy Barry MRN: [id number] Date of Birth: [birth year]  Today's Date: 07/20/2020 PT Individual Time: 1400-1500 PT Individual Time Calculation (min): 60 min   Short Term Goals: Week 1:  PT Short Term Goal 1 (Week 1): Pt will transfer sup to sit w/ log roll and min A PT Short Term Goal 1 - Progress (Week 1): Met PT Short Term Goal 2 (Week 1): Pt will transfer sit to stand and SPT w/ min A PT Short Term Goal 2 - Progress (Week 1): Met PT Short Term Goal 3 (Week 1): Pt will amb 100' w/ RW and CGA. PT Short Term Goal 3 - Progress (Week 1): Met Week 2:  PT Short Term Goal 1 (Week 2): STG=LTG due to ELOS. Week 3:     Skilled Therapeutic Interventions/Progress Updates:    Pain:  Pt reports cervical, head, and bilat LE pain.  Treatment to tolerance.  Rest breaks and repositioning as needed.  Pt initially supine and agreeable to treatment session w/focus on functional mobility and global strengthening.  Supine to sit w/cga, additional time.   Dons shirt w/set up and supervision assist in sitting.  stand pivot transfer bed to wc w/rw and cga.   Once in wc, pt transported to gym for continued session.  Gait 53f w/RW and cga, LE tremors during gait, cues to maintain safe distance within [place].  Standing therex in parallel bars: Stepping to target for coordination + single limb stance, guarding at wbing knee for safety.  Step ups onto 3in step 4x5 leading L, repeated w/R, difficulty w/repeating sequence w/alternating sets, pt frustrated by this "why can't I remember how to do this when I just finished doing it?"  Discussed brain injury recovery/healing time/overstimulation.  Pt stated she was angry, frustrated, sad.  Emotional support provided, pt assured that her emotions were normal response to illness and normal in recovery from brain injury.    Gait 668fw/RW and cga, increased LE tremors noted w/second gait trial.   Short distance  gait wc to bed w/cga, sit to supine w/cga.  Pt left supine w/rails up x 4, alarm set, bed in lowest position, and needs in reach.  Daughter at bedside.  Therapy Documentation Precautions:  Precautions Precautions: Fall Restrictions Weight Bearing Restrictions: No    Therapy/Group: Individual Therapy  [place] Field[place]PT[place]/18/2022, 4:12 PM

## 2020-07-20 NOTE — Progress Notes (Signed)
PROGRESS NOTE   Subjective/Complaints: Still having headaches and generalized body/leg pain. Headache not too bad this morning  ROS: Patient denies fever, rash, sore throat, blurred vision, nausea, vomiting, diarrhea, cough, shortness of breath or chest pain,  or mood change.    Objective:   No results found. Recent Labs    07/20/20 0534  WBC 4.6  HGB 11.3*  HCT 34.6*  PLT 352   Recent Labs    07/20/20 0534  NA 133*  K 3.8  CL 108  CO2 17*  GLUCOSE 86  BUN 5*  CREATININE 0.67  CALCIUM 9.5    Intake/Output Summary (Last 24 hours) at 07/20/2020 0937 Last data filed at 07/20/2020 0825 Gross per 24 hour  Intake 660 ml  Output --  Net 660 ml        Physical Exam: Vital Signs Blood pressure 102/72, pulse 72, temperature 98.6 F (37 C), resp. rate 20, height 5\' 4"  (1.626 m), weight 82.9 kg, last menstrual period 12/22/2017, SpO2 100 %. Constitutional: Mild distress . Vital signs reviewed. HEENT: EOMI, oral membranes moist Neck: supple Cardiovascular: RRR without murmur. No JVD    Respiratory/Chest: CTA Bilaterally without wheezes or rales. Normal effort    GI/Abdomen: BS +, non-tender, non-distended Ext: no clubbing, cyanosis, or edema Psych: pleasant and cooperative Skin: scalp wounds healed. ?mild seroma in temporal area Neuro: reasonable insight and awareness. Functional memory. Cranial nerves 2-12 are intact. Sensory exam is normal. Reflexes are 2+ in all 4's. Fine motor coordination is intact. No tremors. Motor function is grossly 5/5 in UE. LE: limited d/t pain proximall, 2/5 HF, KE and 4+/5 ADF/PF. Neuro exam stable 4/18 Musculoskeletal: Still bilateral thigh and LE tenderness   Assessment/Plan: 1. Functional deficits which require 3+ hours per day of interdisciplinary therapy in a comprehensive inpatient rehab setting.  Physiatrist is providing close team supervision and 24 hour management of  active medical problems listed below.  Physiatrist and rehab team continue to assess barriers to discharge/monitor patient progress toward functional and medical goals  Care Tool:  Bathing    Body parts bathed by patient: Right arm,Left arm,Chest,Abdomen,Front perineal area,Buttocks,Right upper leg,Left upper leg,Face   Body parts bathed by helper: Right lower leg,Left lower leg     Bathing assist Assist Level: Minimal Assistance - Patient > 75%     Upper Body Dressing/Undressing Upper body dressing   What is the patient wearing?: Pull over shirt    Upper body assist Assist Level: Supervision/Verbal cueing    Lower Body Dressing/Undressing Lower body dressing      What is the patient wearing?: Pants     Lower body assist Assist for lower body dressing: Minimal Assistance - Patient > 75%     Toileting Toileting Toileting Activity did not occur (Clothing management and hygiene only): N/A (no void or bm)  Toileting assist Assist for toileting: Contact Guard/Touching assist     Transfers Chair/bed transfer  Transfers assist     Chair/bed transfer assist level: Contact Guard/Touching assist     Locomotion Ambulation   Ambulation assist      Assist level: Contact Guard/Touching assist Assistive device: [place]-rolling Max distance: 15 ft   Walk  10 feet activity   Assist     Assist level: Contact Guard/Touching assist Assistive device: [place]-rolling   Walk 50 feet activity   Assist Walk 50 feet with 2 turns activity did not occur: Refused         Walk 150 feet activity   Assist Walk 150 feet activity did not occur: Refused         Walk 10 feet on uneven surface  activity   Assist Walk 10 feet on uneven surfaces activity did not occur: Safety/medical concerns         Wheelchair     Assist Will patient use wheelchair at discharge?: No Type of Wheelchair: Manual Wheelchair activity did not occur: Refused          Wheelchair 50 feet with 2 turns activity    Assist            Wheelchair 150 feet activity     Assist          Blood pressure 102/72, pulse 72, temperature 98.6 F (37 C), resp. rate 20, height 5\' 4"  (1.626 m), weight 82.9 kg, last menstrual period 12/22/2017, SpO2 100 %.  Medical Problem List and Plan: 1.  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  Antithrombotics: -DVT/anticoagulation:  Mechanical: Sequential compression devices, below knee Bilateral lower extremities             -antiplatelet therapy: N/A 3. Pain Management: Was using NSAIDS tid at home             --will continue Oxycodone as needed  -gabapentin/kpad for myalgias, pain per home regimen   -on scheduled robaxin 750mg  qid  4/18 now on topamax 75mg  BID--observe for sedation. continue voltaren gel.  4. Mood: LCSW to follow for evaluation and support.                         -antipsychotic agents: N/A 5. Neuropsych: This patient is not fully capable of making decisions on her own behalf. 6. Skin/Wound Care: Routine pressure-relief measures. Should be able to remove vent clips RIgh tfrontal  7. Fluids/Electrolytes/Nutrition:    -hypokalemia: potassium up to 4.9 4/14, can dc supplement 8. Hyponatremia:                --continue 1500 cc FR   -Na+ 133 4/18.  -dc salt tabs 9.  Seizure prophylaxis: Continue Keppra twice daily.   10. HTN/Tachycardia: Monitor blood pressures 3 times daily.             --continue Nimotop every 4 hours to prevent vasospasms.             --Continue metoprolol and amlodipine                --lasix held d/t hyponatremia--continue to hold.  -4/18 bp controlled 12.  Connective tissue disorders: Followed by  Dr. Amil Amen. (Had intermittent weakness with RLE with foot drop progressing to left sided weakness--needed therapy X 2 years for recovery)             --  methotrexate--d'ced per patient request.             --now off plaquenil also    -may have more jt pain that she's off these meds.   13.  Stress related hyperglycemia: Hgb A1c-5.4.                -sliding scale insulin for elevated blood sugars.  -cbg's 80-127: continue to monitor.  14.  Hypocalcemia: Continue calcium for oral supplementation 15. Bowel incontinence: now off plaquenil which causes her loose stool  -stopped softeners and laxatives  -continue probiotic, fiber. Discussed high fiber foods that her family can bring in for her  -up to toilet to empty  -added questran to help with stool consistency  -4/18 stools formed yesterday   -may be able to back off regimen soon 16. Fevers: afebrile    -CXR negative 4/9.  -continue to follow     LOS: 11 days A FACE TO [place] 07/20/2020, 9:37 AM

## 2020-07-21 ENCOUNTER — Inpatient Hospital Stay (HOSPITAL_COMMUNITY): Payer: No Typology Code available for payment source

## 2020-07-21 LAB — GLUCOSE, CAPILLARY
Glucose-Capillary: 92 mg/dL (ref 70–99)
Glucose-Capillary: 104 mg/dL — ABNORMAL HIGH (ref 70–99)
Glucose-Capillary: 99 mg/dL (ref 70–99)
Glucose-Capillary: 100 mg/dL — ABNORMAL HIGH (ref 70–99)

## 2020-07-21 MED ORDER — OXYCODONE HCL 5 MG PO TABS
5.0000 mg | ORAL_TABLET | Freq: Four times a day (QID) | ORAL | Status: DC | PRN
  Administered 2020-07-22: 5 mg via ORAL
  Filled 2020-07-21: qty 1

## 2020-07-21 MED ORDER — METHOCARBAMOL 500 MG PO TABS
500.0000 mg | ORAL_TABLET | Freq: Four times a day (QID) | ORAL | Status: DC
  Administered 2020-07-21 – 2020-07-24 (×11): 500 mg via ORAL
  Filled 2020-07-21 (×11): qty 1

## 2020-07-21 MED ORDER — TOPIRAMATE 25 MG PO TABS
50.0000 mg | ORAL_TABLET | Freq: Two times a day (BID) | ORAL | Status: DC
  Administered 2020-07-21 – 2020-07-23 (×5): 50 mg via ORAL
  Filled 2020-07-21 (×6): qty 2

## 2020-07-21 NOTE — Progress Notes (Signed)
Occupational Therapy Session Note  Patient Details  Name: Amy Barry MRN: [id number] Date of Birth: [birth year]  Today's Date: 07/21/2020 OT Individual Time: 1300-1357 OT Individual Time Calculation (min): 57 min    Short Term Goals: Week 2:  OT Short Term Goal 1 (Week 2): LTG=STG 2/2 ELOS  Skilled Therapeutic Interventions/Progress Updates:    Pt greeted semi-reclined in bed. Per daughter, pt is very loopy today from receiving narcotics for headache. Pt agreeable to try to participate in OT. Pt completed bed mobility with increased time and close supervision. Sit<>stand at EOB with RW and CGA, then close supervision to ambulate to the wc with RW. Pt reported some dizziess but it did not get any worse throughout session. Pt and daughter brought down to tub room. Practiced tub shower transfer in simulated home environment. Per daughter, pt's tub is very wide, but it also is a shower with a shower curtian. They already have a HH shower hose. Discussed energy conservation and balance with need to sit to shower. OT also discussed that pt is not safe to step over tub ledge without grab bars. Pt did well with tub shower transfer with supervision. Pt brought to therayp gym and worked on standing balance/endurance with standing peg board puzzle. Pt needed increased time for puzzle but tolerated standing bouts of 5 minutes. Pt returned to room to handoff to SLP for next therapy session.  Therapy Documentation Precautions:  Precautions Precautions: Fall Restrictions Weight Bearing Restrictions: No Pain:  Pt reports headache-6/10. Rest and repositioned  Therapy/Group: Individual Therapy  Valma Cava 07/21/2020, 2:09 PM

## 2020-07-21 NOTE — Progress Notes (Addendum)
PROGRESS NOTE   Subjective/Complaints: Still with significant headaches. More sedated after oxycodone this morning. Doesn't want to get out of bed with OT   ROS: Patient denies fever, rash, sore throat, blurred vision, nausea, vomiting, diarrhea, cough, shortness of breath or chest pain,or mood change.       Objective:   No results found. Recent Labs    07/20/20 0534  WBC 4.6  HGB 11.3*  HCT 34.6*  PLT 352   Recent Labs    07/20/20 0534  NA 133*  K 3.8  CL 108  CO2 17*  GLUCOSE 86  BUN 5*  CREATININE 0.67  CALCIUM 9.5    Intake/Output Summary (Last 24 hours) at 07/21/2020 1101 Last data filed at 07/21/2020 0742 Gross per 24 hour  Intake 360 ml  Output --  Net 360 ml        Physical Exam: Vital Signs Blood pressure 116/75, pulse 71, temperature 98.6 F (37 C), temperature source Oral, resp. rate 20, height 5\' 4"  (1.626 m), weight 82.9 kg, last menstrual period 12/22/2017, SpO2 100 %. .Constitutional: No distress . Vital signs reviewed. HEENT: EOMI, oral membranes moist Neck: supple Cardiovascular: RRR without murmur. No JVD    Respiratory/Chest: CTA Bilaterally without wheezes or rales. Normal effort    GI/Abdomen: BS +, non-tender, non-distended Ext: no clubbing, cyanosis, or edema Psych: pleasant and cooperative Skin: scalp wounds healed. ?mild seroma in temporal area Neuro: more lethargic. Follows commands. Normal language, although a little slurred. Pupils reactive, EOMI.  Cranial nerves 2-12 are intact. Sensory exam is normal. Reflexes are 2+ in all 4's. Fine motor coordination is intact. No tremors. Motor function is grossly 5/5 in UE. LE: limited d/t pain proximall, 2/5 HF, KE and 4+/5 ADF/PF. Neuro exam stable 4/18 Musculoskeletal: Still bilateral thigh and LE tenderness   Assessment/Plan: 1. Functional deficits which require 3+ hours per day of interdisciplinary therapy in a comprehensive  inpatient rehab setting.  Physiatrist is providing close team supervision and 24 hour management of active medical problems listed below.  Physiatrist and rehab team continue to assess barriers to discharge/monitor patient progress toward functional and medical goals  Care Tool:  Bathing    Body parts bathed by patient: Right arm,Left arm,Chest,Abdomen,Front perineal area,Buttocks,Right upper leg,Left upper leg,Face   Body parts bathed by helper: Right lower leg,Left lower leg     Bathing assist Assist Level: Minimal Assistance - Patient > 75%     Upper Body Dressing/Undressing Upper body dressing   What is the patient wearing?: Pull over shirt    Upper body assist Assist Level: Supervision/Verbal cueing    Lower Body Dressing/Undressing Lower body dressing      What is the patient wearing?: Pants     Lower body assist Assist for lower body dressing: Minimal Assistance - Patient > 75%     Toileting Toileting Toileting Activity did not occur (Clothing management and hygiene only): N/A (no void or bm)  Toileting assist Assist for toileting: Contact Guard/Touching assist     Transfers Chair/bed transfer  Transfers assist     Chair/bed transfer assist level: Contact Guard/Touching assist     Locomotion Ambulation   Ambulation assist  Assist level: Contact Guard/Touching assist Assistive device: Jue-rolling Max distance: 60   Walk 10 feet activity   Assist     Assist level: Contact Guard/Touching assist Assistive device: Coiro-rolling   Walk 50 feet activity   Assist Walk 50 feet with 2 turns activity did not occur: Refused  Assist level: Contact Guard/Touching assist Assistive device: L[place]er-rolling    Walk 150 feet activity   Assist Walk 150 feet activity did not occur: Refused         Walk 10 feet on uneven surface  activity   Assist Walk 10 feet on uneven surfaces activity did not occur: Safety/medical concerns          Wheelchair     Assist Will patient use wheelchair at discharge?: No Type of Wheelchair: Manual Wheelchair activity did not occur: Refused         Wheelchair 50 feet with 2 turns activity    Assist            Wheelchair 150 feet activity     Assist          Blood pressure 116/75, pulse 71, temperature 98.6 F (37 C), temperature source Oral, resp. rate 20, height 5\' 4"  (1.626 m), weight 82.9 kg, last menstrual period 12/22/2017, SpO2 100 %.  Medical Problem List and Plan: 1.  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  Antithrombotics: -DVT/anticoagulation:  Mechanical: Sequential compression devices, below knee Bilateral lower extremities             -antiplatelet therapy: N/A 3. Pain Management: Was using NSAIDS tid at home             --will continue Oxycodone as needed  -gabapentin/kpad for myalgias, pain per home regimen   -on scheduled robaxin 750mg  qid  4/19. Feel that increased topamax dose, robaxin and oxy are contributing to lethargy. Reduce to 50mg , 5mg , and 500mg  respectively 4. Mood: LCSW to follow for evaluation and support.                         -antipsychotic agents: N/A 5. Neuropsych: This patient is not fully capable of making decisions on her own behalf. 6. Skin/Wound Care: Routine pressure-relief measures. Should be able to remove vent clips RIgh tfrontal  7. Fluids/Electrolytes/Nutrition:    -hypokalemia: potassium up to 4.9 4/14, can dc supplement 8. Hyponatremia:                --continue 1500 cc FR   -Na+ 133 4/18.  -off salt tabs 9.  Seizure prophylaxis: Continue Keppra twice daily.   10. HTN/Tachycardia:  Monitor blood pressures 3 times daily.             --continue Nimotop every 4 hours to prevent vasospasms.             --Continue metoprolol and amlodipine                --lasix held d/t hyponatremia--continue to hold.  -4/19 bp controlled 12.  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 (Had intermittent weakness with RLE with foot drop progressing to left sided weakness--needed therapy X 2 years for recovery)             --  methotrexate--d'ced per patient request.             --now off plaquenil also    - more jt pain that she's off these meds.   13.  Stress related hyperglycemia: Hgb A1c-5.4.                -sliding scale insulin for elevated blood sugars.  -cbg's 80-127: continue to monitor.  14.  Hypocalcemia: Continue calcium for oral supplementation 15. Bowel incontinence: now off plaquenil which causes her loose stool  -stopped softeners and laxatives  -continue probiotic, fiber. Discussed high fiber foods that her family can bring in for her  -up to toilet to empty  -added questran to help with stool consistency  -4/18 stools formed    -may be able to back off regimen soon 16. Fevers: afebrile    -CXR negative 4/9.  -continue to follow     LOS: 12 days A FACE TO [place] 07/21/2020, 11:01 AM

## 2020-07-21 NOTE — Progress Notes (Signed)
Speech Language Pathology Daily Session Note  Patient Details  Name: Amy Barry MRN: [id number] Date of Birth: 05-15-1970  Today's Date: 07/21/2020 SLP Individual Time: 1357-1440 SLP Individual Time Calculation (min): 43 min  Short Term Goals: Week 2: SLP Short Term Goal 1 (Week 2): STGs=LTGS due to ELOS  Skilled Therapeutic Interventions: Skilled treatment session focused on family education with the patient and her daughter. SLP facilitated session by providing strategies to utilize at home to maximize recall and carryover of functional information. SLP also provided education regarding the importance of utilizing a routine and staying cognitively and physically active at home to maximize functional independence and generated a list of activities to facilitate cognitive and functional recovery. Both verbalized understanding of all information. Patient left upright in bed with alarm on and all needs within reach. Continue with current plan of care.      Pain 6/10 headache Patient repositioned and premedicated   Therapy/Group: Individual Therapy  Luca Dyar 07/21/2020, 3:50 PM

## 2020-07-21 NOTE — Progress Notes (Signed)
Patient ID: Amy Barry, female   DOB: 08-13-1970, 50 y.o.   MRN: [id number]  SW met with pt and pt dtr in room to provide updates from team conference. SW called [name] [phone]) while in room. SW discussed HHA preference. Pt had not shared with husband or dtr. SW discussed DME recommendations: RW and transport chair. State pt has RW. SW to follow-up about HHA preference, and will order DME.   Loralee Pacas, MSW, [place] Office: [phone] Cell: [phone] Fax: [phone]

## 2020-07-21 NOTE — Progress Notes (Signed)
Physical Therapy Session Note  Patient Details  Name: [name] MRN: [id number] Date of Birth: Nov 01, 1970  Today's Date: 07/21/2020 PT Individual Time: 0800-0915 PT Individual Time Calculation (min): 75 min   Short Term Goals: Week 1:  PT Short Term Goal 1 (Week 1): Pt will transfer sup to sit w/ log roll and min A PT Short Term Goal 1 - Progress (Week 1): Met PT Short Term Goal 2 (Week 1): Pt will transfer sit to stand and SPT w/ min A PT Short Term Goal 2 - Progress (Week 1): Met PT Short Term Goal 3 (Week 1): Pt will amb 100' w/ RW and CGA. PT Short Term Goal 3 - Progress (Week 1): Met Week 2:  PT Short Term Goal 1 (Week 2): STG=LTG due to ELOS. Week 3:     Skilled Therapeutic Interventions/Progress Updates:    Pain:  Pt reports headache pain, reports nursing just provided meds.  Treatment to tolerance.  Rest breaks and repositioning as needed.  Pt initially supine and agreeable to treatment session w/focus on am ADLs, functional mobility, education.  Bed mobility: rolling, supine to sit w/supervision using bedrail Sits on edge of bed w/supervision. Requires assist to initiate donning socks but able to pull on w/supervision. stand pivot transfer bed to wc w/cga, addiitional time, no AD. Pt brushes teeth, washes face w/set up,  and therapist assist w/fixing hair for energy conservation only.  Pt transported to gym.  Gait 166f w/RW and cga LE tremors increase w/fatigue.  Stairs: ascended/descended 8 steps w/2 rails w/min assist, cues for sequencing, additional time, increased LE tremors w/fatigue.  Discussed need for chair on landing at home to break up 7 + 7 stairs to second level bedroom.  Also discussed equipment recommendations including transport wc, RW, tub bench.  Pt expressed wish to obtain these items, expressed husband resistant to these.  Car: performs using RW w/cga, additional time  Ramp: ascended/descended 151framp w/cga and cues for safety w/RW.  Gait as  above x 12555f stand pivot transfer wc to bed w/cga. Pt left supine w/rails up x 4, alarm set, bed in lowest position, and needs in reach.  Therapy Documentation Precautions:  Precautions Precautions: Fall Restrictions Weight Bearing Restrictions: No    Therapy/Group: Individual Therapy  BarCallie FieldingT  BarCallie FieldingT [place]19/2022, 4:05 PM

## 2020-07-21 NOTE — Patient Care Conference (Signed)
Inpatient RehabilitationTeam Conference and Plan of Care Update Date: 07/21/2020   Time: 10:38 AM    Patient Name: Amy Barry      Medical Record Number: [id number]  Date of Birth: 1970-06-04 Sex: Female         Room/Bed: 4M11C/4M11C-01 Payor Info: Payor: Holland Falling / Plan: [place] PREFERRED / Product Type: *No Product type* /    Admit Date/Time:  07/09/2020  3:43 PM  Primary Diagnosis:  Subarachnoid bleed [facility])  Hospital Problems: Principal Problem:   Subarachnoid bleed [facility]) Active Problems:   Essential hypertension   Gait abnormality   Cognitive deficits   Frontal lobe and executive function deficit    Expected Discharge Date: Expected Discharge Date: 07/24/20  Team Members Present: Physician leading conference: Dr. Alger Simons Care Coodinator Present: Loralee Pacas, LCSWA;Desjuan Stearns Creig Hines, RN, BSN, [place] Nurse Present: Dorthula Nettles, RN PT Present: Callie Fielding, PT OT Present: Cherylynn Ridges, OT SLP Present: Weston Anna, SLP PPS Coordinator present : Gunnar Fusi, SLP     Current Status/Progress Goal Weekly Team Focus  Bowel/Bladder   continent of bowel & bladder, LBM 07/19/20  remain continent  assess for changes   Swallow/Nutrition/ Hydration             ADL's   CGA overall  supervision  self-care retraining, activity tolerance, balance, general strengthening   Mobility   cga bed mobility, cga transfers and cga gait w/RW, min assist stairs w/2 rails  supervision overall  gait endurance, standing balance, stairs, functional strength   Communication             Safety/Cognition/ Behavioral Observations  Min A  Supervision  complex problem solving, recall with use of strategies   Pain   c/o headaches & pain to Bil feet 7/10  pain scale <3/10  assess & treat as needed   Skin   surgical incision healing to head  no signs of infection  assess q shift     Discharge Planning:  Pt needs to be MOD I at d/c. Pt will have intermittent support from  husband who works, and adult dtr who works 3hrs per day. Husband states pt will have 24/7 care. States they also have cameras throughout the home to watch pt as well. 50 y.o dtr to help as able as she is still in highscool and will be leaving for college after graduation. Fam edu completed on 4/17 with pt husband and two daughters.   Team Discussion: Patient is experiencing general aches, headaches, stools have firmed up since discontinuing the Plaquenil. Nursing reports patient is continent and reporting 8/10 pain, given PRN Oxy. No sleep issues reported, and the right crani incision site is clean and dry. Social work reports family education was on Sunday.  Patient on target to meet rehab goals: yes, family was not receptive to bathroom transfers but the patient was, and the family states that she is doing better. She is contact guard assist with everything. She fatigues quickly and does have some type of tremors, not sure of the etiology. Family is also not receptive to equipment recommendations. Patient understands why she needs the equipment and is okay with it. SLP reports that she is doing well the memory strategies.   *See Care Plan and progress notes for long and short-term goals.   Revisions to Treatment Plan:  Titration of Topamax for headaches.  Teaching Needs: Family education, medication management, pain management, skin/wound care, transfer training, gait training, balance training, endurance training, safety awareness.  Current Barriers  to Discharge: Decreased caregiver support, Medical stability, Home enviroment access/layout, Wound care, Lack of/limited family support, Medication compliance and Behavior  Possible Resolutions to Barriers: Continue current medications, provide emotional support.     Medical Summary Current Status: ongoing headaches---topamax titration. jt/hip pain likely related to CT disorder.  Barriers to Discharge: Medical stability   Possible Resolutions to  Barriers/Weekly Focus: pain mgt, optimize nutrition. daily monitoring of labs and pt data   Continued Need for Acute Rehabilitation Level of Care: The patient requires daily medical management by a physician with specialized training in physical medicine and rehabilitation for the following reasons: Direction of a multidisciplinary physical rehabilitation program to maximize functional independence : Yes Medical management of patient stability for increased activity during participation in an intensive rehabilitation regime.: Yes Analysis of laboratory values and/or radiology reports with any subsequent need for medication adjustment and/or medical intervention. : Yes   I attest that I was present, lead the team conference, and concur with the assessment and plan of the team.   Cristi Loron 07/21/2020, 4:03 PM

## 2020-07-22 ENCOUNTER — Other Ambulatory Visit: Payer: Self-pay

## 2020-07-22 LAB — GLUCOSE, CAPILLARY
Glucose-Capillary: 100 mg/dL — ABNORMAL HIGH (ref 70–99)
Glucose-Capillary: 98 mg/dL (ref 70–99)

## 2020-07-22 MED ORDER — SIMETHICONE 80 MG PO CHEW: 80.0000 mg | CHEWABLE_TABLET | Freq: Four times a day (QID) | ORAL | 0 refills | Status: DC | PRN

## 2020-07-22 MED ORDER — MUSCLE RUB 10-15 % EX CREA: 1.0000 "application " | TOPICAL_CREAM | Freq: Two times a day (BID) | CUTANEOUS | 0 refills | Status: DC | PRN

## 2020-07-22 MED ORDER — CALCIUM POLYCARBOPHIL 625 MG PO TABS: 625.0000 mg | ORAL_TABLET | Freq: Every day | ORAL | Status: DC

## 2020-07-22 MED ORDER — SACCHAROMYCES BOULARDII 250 MG PO CAPS: 250.0000 mg | ORAL_CAPSULE | Freq: Two times a day (BID) | ORAL | Status: AC

## 2020-07-22 NOTE — Progress Notes (Signed)
PROGRESS NOTE   Subjective/Complaints: Feeling a little better today. Headache better controlled. Able to sleep.   ROS: Patient denies fever, rash, sore throat, blurred vision, nausea, vomiting, diarrhea, cough, shortness of breath or chest pain, joint or back pain,   or mood change.       Objective:   CT HEAD WO CONTRAST  Result Date: 07/21/2020 CLINICAL DATA:  Status post intervention.  Headache. EXAM: CT HEAD WITHOUT CONTRAST TECHNIQUE: Contiguous axial images were obtained from the base of the skull through the vertex without intravenous contrast. COMPARISON:  07/08/2020 FINDINGS: Brain: Unchanged area of hypoattenuation in the right frontal lobe along the course of the previously present EVD catheter. Previously seen acute hemorrhage has resolved. No new site of hemorrhage. Vascular: No hyperdense vessel or unexpected calcification. Skull: Small right frontal burr hole Sinuses/Orbits: No acute finding. Other: None. IMPRESSION: 1. Unchanged area of hypoattenuation in the right frontal lobe along the course of the previously present EVD catheter. Previously seen acute hemorrhage has resolved. 2. No new site of hemorrhage. Electronically Signed   By: Ulyses Jarred M.D.   On: 07/21/2020 19:33   Recent Labs    07/20/20 0534  WBC 4.6  HGB 11.3*  HCT 34.6*  PLT 352   Recent Labs    07/20/20 0534  NA 133*  K 3.8  CL 108  CO2 17*  GLUCOSE 86  BUN 5*  CREATININE 0.67  CALCIUM 9.5    Intake/Output Summary (Last 24 hours) at 07/22/2020 0840 Last data filed at 07/22/2020 0700 Gross per 24 hour  Intake 360 ml  Output --  Net 360 ml        Physical Exam: Vital Signs Blood pressure 115/76, pulse 74, temperature 98.8 F (37.1 C), resp. rate 20, height 5\' 4"  (1.626 m), weight 82.9 kg, last menstrual period 12/22/2017, SpO2 100 %. .Constitutional: No distress . Vital signs reviewed. HEENT: EOMI, oral membranes moist Neck:  supple Cardiovascular: RRR without murmur. No JVD    Respiratory/Chest: CTA Bilaterally without wheezes or rales. Normal effort    GI/Abdomen: BS +, non-tender, non-distended Ext: no clubbing, cyanosis, or edema Psych: pleasant and cooperative,  Skin: scalp wounds healed. ?mild seroma in temporal area Neuro: alert and appropriate today. Needed calendar to figure out date.  Normal language, although a little slurred. Pupils reactive, EOMI.  Cranial nerves 2-12 are intact. Sensory exam is normal. Reflexes are 2+ in all 4's. Fine motor coordination is intact. No tremors. Motor function is grossly 5/5 in UE. LE: limited d/t pain proximall, 2/5 HF, KE and 4+/5 ADF/PF. Neuro exam stable 4/18 Musculoskeletal: Still bilateral thigh and LE tenderness   Assessment/Plan: 1. Functional deficits which require 3+ hours per day of interdisciplinary therapy in a comprehensive inpatient rehab setting.  Physiatrist is providing close team supervision and 24 hour management of active medical problems listed below.  Physiatrist and rehab team continue to assess barriers to discharge/monitor patient progress toward functional and medical goals  Care Tool:  Bathing    Body parts bathed by patient: Right arm,Left arm,Chest,Abdomen,Front perineal area,Buttocks,Right upper leg,Left upper leg,Face   Body parts bathed by helper: Right lower leg,Left lower leg  Bathing assist Assist Level: Minimal Assistance - Patient > 75%     Upper Body Dressing/Undressing Upper body dressing   What is the patient wearing?: Pull over shirt    Upper body assist Assist Level: Supervision/Verbal cueing    Lower Body Dressing/Undressing Lower body dressing      What is the patient wearing?: Pants     Lower body assist Assist for lower body dressing: Minimal Assistance - Patient > 75%     Toileting Toileting Toileting Activity did not occur (Clothing management and hygiene only): N/A (no void or bm)  Toileting  assist Assist for toileting: Contact Guard/Touching assist     Transfers Chair/bed transfer  Transfers assist     Chair/bed transfer assist level: Contact Guard/Touching assist     Locomotion Ambulation   Ambulation assist      Assist level: Contact Guard/Touching assist Assistive device: [place]onda-rolling Max distance: 135   Walk 10 feet activity   Assist     Assist level: Contact Guard/Touching assist Assistive device: Longest-rolling   Walk 50 feet activity   Assist Walk 50 feet with 2 turns activity did not occur: Refused  Assist level: Contact Guard/Touching assist Assistive device: [place]ell-rolling    Walk 150 feet activity   Assist Walk 150 feet activity did not occur: Safety/medical concerns         Walk 10 feet on uneven surface  activity   Assist Walk 10 feet on uneven surfaces activity did not occur: Safety/medical concerns   Assist level: Contact Guard/Touching assist Assistive device: Aeronautical engineer Will patient use wheelchair at discharge?: No Type of Wheelchair: Manual Wheelchair activity did not occur: Refused         Wheelchair 50 feet with 2 turns activity    Assist            Wheelchair 150 feet activity     Assist          Blood pressure 115/76, pulse 74, temperature 98.8 F (37.1 C), resp. rate 20, height 5\' 4"  (1.626 m), weight 82.9 kg, last menstrual period 12/22/2017, SpO2 100 %.  Medical Problem List and Plan: 1.  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 Stable to improved 2.  Antithrombotics: -DVT/anticoagulation:  Mechanical: Sequential compression devices, below knee Bilateral lower extremities              -antiplatelet therapy: N/A 3. Pain Management: Was using NSAIDS tid at home             --will continue Oxycodone as needed  -gabapentin/kpad for myalgias, pain per home regimen   -on scheduled robaxin 750mg  qid  4/19.   Reduced oxy,robaxin,topamax d/t lethargy--improved 4/20 4. Mood: LCSW to follow for evaluation and support.                         -antipsychotic agents: N/A 5. Neuropsych: This patient is not fully capable of making decisions on her own behalf. 6. Skin/Wound Care: Routine pressure-relief measures. Should be able to remove vent clips RIgh tfrontal  7. Fluids/Electrolytes/Nutrition:    -  hypokalemia: potassium up to 4.9 4/14, can dc supplement 8. Hyponatremia:                --continue 1500 cc FR   -Na+ 133 4/18.  -off salt tabs 9.  Seizure prophylaxis: Continue Keppra twice daily.   10. HTN/Tachycardia: Monitor blood pressures 3 times daily.             --continue Nimotop every 4 hours to prevent vasospasms.             --Continue metoprolol and amlodipine                --lasix held d/t hyponatremia--continue to hold.  -4/20 bp controlled 12.  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 (Had intermittent weakness with RLE with foot drop progressing to left sided weakness--needed therapy X 2 years for recovery)             --  methotrexate--d'ced per patient request.             --now off plaquenil also    - more jt pain that she's off these meds. D/w her today  84.  Stress related hyperglycemia: Hgb A1c-5.4.                -sliding scale insulin for elevated blood sugars.  -cbg's 80-127: continue to monitor.  14.  Hypocalcemia: Continue calcium for oral supplementation 15. Bowel incontinence: now off plaquenil which causes her loose stool  -stopped softeners and laxatives  -continue probiotic, fiber. Discussed high fiber foods that her family can bring in for her  -up to toilet to empty  -added questran to help with stool consistency  -4/19 stools formed     - continue regimen 16. Fevers: afebrile    -CXR negative 4/9.  -continue to follow     LOS: 13 days A FACE TO [place] 07/22/2020, 8:40 AM

## 2020-07-22 NOTE — Progress Notes (Signed)
Occupational Therapy Session Note  Patient Details  Name: Amy Barry MRN: [id number] Date of Birth: 12-15-1970  Today's Date: 07/22/2020 OT Individual Time: 1500-1600 OT Individual Time Calculation (min): 60 min    Skilled Therapeutic Interventions/Progress Updates:    1:1. Pt received in bed agreeable to OT, however has HA. Pt sits EOB with S and uses sock aide with set up to don socks with no cuing needed but increased time. Total A transport to tx gym. Pt completes standing tabletop task for standing tolerance and [facility] creating picture with push pins and CGA for standing balance fading to S. Pt requires rest break prior to finishing picture. Pt reporting needing to toilet. Pt ambulates with RW w/c>bathroom>EOB wit S-CGA and VC for hand placement. Pt able to manage clothing and requires increased time to void bowel and small BM. Exited session with pt seated in bed, exit alarm on and call light in reach   Therapy Documentation Precautions:  Precautions Precautions: Fall Restrictions Weight Bearing Restrictions: No General:   Vital Signs: Therapy Vitals Temp: 98.8 F (37.1 C) Pulse Rate: 74 Resp: 20 BP: 115/76 Patient Position (if appropriate): Lying Oxygen Therapy SpO2: 100 % O2 Device: Room Air Pain:   ADL: ADL Eating: Not assessed Grooming: Minimal assistance Where Assessed-Grooming: Edge of bed Upper Body Bathing: Moderate assistance Where Assessed-Upper Body Bathing: Edge of bed Lower Body Bathing: Maximal assistance Where Assessed-Lower Body Bathing: Bed level Upper Body Dressing: Moderate assistance Where Assessed-Upper Body Dressing: Edge of bed Lower Body Dressing: Maximal assistance Where Assessed-Lower Body Dressing: Bed level Toileting: Not assessed Toilet Transfer: Not assessed Tub/Shower Transfer: Not assessed Vision   Perception    Praxis   Exercises:   Other Treatments:     Therapy/Group: Individual Therapy  [name] 07/22/2020, 6:58 AM

## 2020-07-22 NOTE — Progress Notes (Signed)
Physical Therapy Session Note  Patient Details  Name: Amy Barry MRN: [id number] Date of Birth: 1971-03-06  Today's Date: 07/22/2020 PT Individual Time: 1100-1156 PT Individual Time Calculation (min): 56 min   Short Term Goals: Week 2:  PT Short Term Goal 1 (Week 2): STG=LTG due to ELOS.  Skilled Therapeutic Interventions/Progress Updates:     CSW discussed concerns about patient's cognitive status, reporting patient with poor recall of CSW and previous discussion as well as slow response to questions just prior to session. Patient in bed upon PT arrival. Patient alert and agreeable to PT session. Patient reported 8/10 headache during session, RN made aware. PT provided repositioning, rest breaks, and distraction as pain interventions throughout session. Patient also reported increased fatigue. Noted to be speaking on soft tones and with slight delay in response. Able to provided correct answers to all orientation questions, required increased time to remember the year. Strength testing without change at bed level, patient denied sensory or vision changes. Vitals: 134/79, HR 74, SPO2 100%. Patient also without changes in functional mobility during session.   During session patient reported feeling upset this morning by her family discussing brining a new cat or dog home prior to her d/c to help her cope with the recent loss of her dog during admission. Discussed stress management strategies and encouraged her to communicate her concerns with her family to reduce stress. Also, educated on the increased fall risk with a new, young pet in the home at d/c and encouraged her to discuss this with her family. She also reports that she does get headaches when she is stress and feels like she cannot "think straight."   Therapeutic Activity: Bed Mobility: Patient performed supine to/from sit with mod I in a flat bed without use of bed rails.  Transfers: Patient performed sit to/from stand from the bed and  the toilet with CGA using a RW. Provided verbal cues for forward weight shift and pushing up to stand.  Gait Training:  Patient ambulated ~15 feet to/from the bathroom and 110 feet with 2 180 degree turns using RW with CGA. Ambulated with decreased gait speed, forward trunk flexion, mild R lower extremity shaking/instability without buckling, decreased step height and length with mild steppage and circumducted gait on R. Provided verbal cues for erect posture, proximity to RW for safety, increased step height for safety, and lateral hip stability on stance limb.  Discussed d/c planning and PT goals for d/c during session. Patient feeling confident and "excited" to return home with her family. Denied any questions/concerns at this time.  Patient in bed at end of session with breaks locked, bed alarm set, and all needs within reach.    Therapy Documentation Precautions:  Precautions Precautions: Fall Restrictions Weight Bearing Restrictions: No General: PT Amount of Missed Time (min): 4 Minutes PT Missed Treatment Reason: Patient fatigue   Therapy/Group: Individual Therapy  Jazzma Neidhardt L Kyrin Gratz PT, DPT  07/22/2020, 5:18 PM

## 2020-07-22 NOTE — Progress Notes (Signed)
Patient ID: Amy Barry, female   DOB: [birth year], 50 y.o.   MRN: [id number]  SW met with pt in room to get updates on HHA preference. Pt reports selection has not been made. When discussing with pt DME: TTB and transport chair. Pt would like items ordered. Pt was not able to decide which HHA. SW informed will follow-up with her husband. She also provided SW with FMLA forms. SW made efforts to contact pt husband, but voicemail is full.   SW spoke with [name] [phone]) to discuss HHA, DME, and FMLA forms. Husband to review, as he reports he was just made aware of HHA selection as of yesterday. SW informed on DME to be ordered. He inquired about cost for [facility] and DME. SW informed on pre-admission information with regard to coverage, and encouraged follow-up with insurance to discuss. SW also informed on FMLA forms, and asked if he would like SW to fax forms. SW to fax FMLA forms.  SW faxed FMLA forms to [place] [facility] [phone]). SW ordered DME with [employer] via parachute.   Loralee Pacas, MSW, [place] Office: [phone] Cell: [phone] Fax: [phone]

## 2020-07-22 NOTE — Progress Notes (Signed)
Followed up on patient per SW request. She was restless and anxious about her HA but did not want to take anything. She is also very concerned about her cognitive deficits and when she can return to work--encouraged use of memory book. Has not taken any oxycodone since yesterday am. Advised her to use tylenol, ice pack and trial 5 mg oxycodone if conservative measures ineffective. Daughter in room advised to monitor for any SE and continue to cue on cognitive deficits.  CBG checks d/c to decrease anxiety and as BS well controlled.

## 2020-07-22 NOTE — Progress Notes (Signed)
Physical Therapy Session Note  Patient Details  Name: Amy Barry MRN: [id number] Date of Birth: 01-12-1971  Today's Date: 07/22/2020 PT Individual Time:  0930-1003 PT Individual Time Calculation (min): 33 min     Short Term Goals: Week 2:  PT Short Term Goal 1 (Week 2): STG=LTG due to ELOS.  Skilled Therapeutic Interventions/Progress Updates:  Patient supine in bed upon PT arrival. Patient alert and agreeable to PT session although she states that she had a bout of emesis that morning and was currently very cold. Patient denied pain during session.  Therapeutic Activity: Bed Mobility: Patient performed supine --> sit with supervision initially but was unable to maintain seated position and then required vc with Min A for initiation and compketes with CGA. Body tremors in seated position requiring Min A to maintain seated position. Improves to requiring intermittent CGA tomaintain. Guided pt in changing into longer sleeve shirt for improved warmth. Requires Min A to initiate dressing. Pt states not feeling well and requests to lie down. With time, she is able to bring UB to bed surface and CGA to assist with BLE to bed surface. Pt is able to follow instructions to position in hooklying, grab to bed rails and adjust body position toward [facility] with supervision.   Pt states n/t with some burning pain in lower back and back of BLE. Guided in gentle abdominal bracing and progressing into mild posterior chain neural glides within pain free ROM and then into pain free SKTC with touch to knee of opposite hand. Requires verbal instructions with visual cues and progresses with practice to intermittent verbal cues only. Performed x15.   Patient supine in bed at end of session with brakes locked, bed alarm set, and all needs within reach.     Therapy Documentation Precautions:  Precautions Precautions: Fall Restrictions Weight Bearing Restrictions: No  Therapy/Group: Individual Therapy  Alger Simons 07/22/2020, 5:02 PM

## 2020-07-22 NOTE — Progress Notes (Signed)
Speech Language Pathology Daily Session Note  Patient Details  Name: Amy Barry MRN: [id number] Date of Birth: 1970/12/23  Today's Date: 07/22/2020 SLP Individual Time: 1300-1345 SLP Individual Time Calculation (min): 45 min  Short Term Goals: Week 2: SLP Short Term Goal 1 (Week 2): STGs=LTGS due to ELOS  Skilled Therapeutic Interventions: Skilled treatment session focused on cognitive goals. Upon arrival, patient was eating her lunch partially reclined in bed. SLP repositioned the patient to maximize safety and efficiency with PO intake. Patient asking orientation questions and required supervision level verbal cues for use of her memory notebook. SLP also organized her memory notebook to maximize efficiency and utilization. Patient recalled her current medications and their functions with Min A verbal cues. Recommend organizing a pill box during next session. Patient appeared lethargic throughout session and required extra time for verbal expression and to complete all tasks. Patient left upright in bed with alarm on and all needs within reach. Continue with current plan of care.      Pain 6/10 pain in head RN aware. Patient repositioned  Therapy/Group: Individual Therapy  Amy Barry 07/22/2020, 3:49 PM

## 2020-07-23 ENCOUNTER — Other Ambulatory Visit: Payer: Self-pay

## 2020-07-23 MED ORDER — METHOCARBAMOL 500 MG PO TABS: 500.0000 mg | ORAL_TABLET | Freq: Four times a day (QID) | ORAL | 0 refills | Status: DC

## 2020-07-23 MED ORDER — GABAPENTIN 300 MG PO CAPS: 300.0000 mg | ORAL_CAPSULE | Freq: Every day | ORAL | 0 refills | Status: DC

## 2020-07-23 MED ORDER — CHOLESTYRAMINE 4 G PO PACK: 4.0000 g | PACK | Freq: Three times a day (TID) | ORAL | 0 refills | Status: DC

## 2020-07-23 MED ORDER — LIDOCAINE 5 % EX PTCH: 2.0000 | MEDICATED_PATCH | CUTANEOUS | 0 refills | Status: DC

## 2020-07-23 MED ORDER — LEVETIRACETAM 100 MG/ML PO SOLN: 500.0000 mg | Freq: Two times a day (BID) | ORAL | 0 refills | Status: DC

## 2020-07-23 MED ORDER — OXYCODONE HCL 5 MG PO TABS: 5.0000 mg | ORAL_TABLET | Freq: Four times a day (QID) | ORAL | 0 refills | Status: DC | PRN

## 2020-07-23 MED ORDER — TOPIRAMATE 50 MG PO TABS: 50.0000 mg | ORAL_TABLET | Freq: Two times a day (BID) | ORAL | 0 refills | Status: DC

## 2020-07-23 MED ORDER — TRAZODONE HCL 50 MG PO TABS: 50.0000 mg | ORAL_TABLET | Freq: Every day | ORAL | 0 refills | Status: DC

## 2020-07-23 MED ORDER — ACETAMINOPHEN-CODEINE #3 300-30 MG PO TABS: 1.0000 | ORAL_TABLET | ORAL | 0 refills | Status: DC | PRN

## 2020-07-23 MED ORDER — DICLOFENAC SODIUM 1 % EX GEL: 2.0000 g | Freq: Four times a day (QID) | CUTANEOUS | 0 refills | Status: DC

## 2020-07-23 NOTE — Progress Notes (Signed)
Physical Therapy Session Note  Patient Details  Name: Amy Barry MRN: [id number] Date of Birth: [birth year]  Today's Date: 07/23/2020 PT Individual Time: 0915-0940 PT Individual Time Calculation (min): 25 min   Short Term Goals: Week 2:  PT Short Term Goal 1 (Week 2): STG=LTG due to ELOS.  Skilled Therapeutic Interventions/Progress Updates:    Pt received seated in w/c in room, agreeable to PT session. Pt reports ongoing headache and BLE pain, declines intervention. Sit to stand and stand pivot transfer with RW and Supervision throughout session. Ambulation w/c to/from simulation car x 10 ft with RW and CGA, pt initially with trembling in BLE and occasional buckling of LE, no LOB. Car transfer with CGA with cues for safe transfer technique with RW at simulation height of sedan pt will d/c home in. Ambulation x 200 ft with CGA and RW with improved gait pattern noted, no trembling of BLE and no instances of LE buckling. Pt left seated in w/c in room with needs in reach, quick release belt and chair alarm in place at end of session.  Therapy Documentation Precautions:  Precautions Precautions: Fall Precaution Comments: SBP < 140 Restrictions Weight Bearing Restrictions: No   Therapy/Group: Individual Therapy   Excell Seltzer, PT, DPT, CSRS  07/23/2020, 12:14 PM

## 2020-07-23 NOTE — Progress Notes (Signed)
Patient ID: Amy Barry, female   DOB: [birth year], 50 y.o.   MRN: [id number]  SW received updates from pt husband [name], preferred HHA is [facility]. SW sent HHPT/OT/SLP referral to [facility] and waiting on follow-up.  *SW spoke with Lattie Haw [phone]) to discuss referral, and they do not have a speech therapist in [place] at this time.  SW spoke with pt husband [name] about changes. Prefers [facility] if possible. He is aware if unable to secure a HHA, will work on one that can.   SW spoke with Cheryl/[facility] [phone]) to discuss referral. Reports pt benefits are out of network.   SW sent referral to [facility], and waiting on follow-up.   *SW met with pt, pt husband [name], and dtr [name] to provide updates on above. They state if pt requires outpatient therapies, they will ensure she has support to get there.   Loralee Pacas, MSW, [place] Office: [phone] Cell: [phone] Fax: [phone]

## 2020-07-23 NOTE — Progress Notes (Signed)
Occupational Therapy Discharge Summary  Patient Details  Name: Amy Barry MRN: [id number] Date of Birth: 1970/04/13  Today's Date: 07/23/2020 OT Individual Time: 1300-1345 OT Individual Time Calculation (min): 45 min   OT treatment session focused on increased independence with BADL tasks. Pt able to access dresser drawers, collect clothing, ambulate to bathroom with RW all with close supervision and RW. Bathing/dressing completed AE, supervision, and increased time. Standing balance/endurance at the sink while brushing hair with supervision. See functional navigator for further details. Pt left semi-reclined in bed with bed alarm on, call bell in reach, and family present.   Patient has met 8 of 8 long term goals due to improved activity tolerance, improved balance, postural control, ability to compensate for deficits, improved attention, improved awareness and improved coordination.  Patient to discharge at overall Supervision level.  Patient's care partner is independent to provide the necessary physical and cognitive assistance at discharge.    Reasons goals not met: n/a   Recommendation:  Patient will benefit from ongoing skilled OT services in home health setting to continue to advance functional skills in the area of BADL and iADL.  Equipment: tub transfer bench  Reasons for discharge: treatment goals met and discharge from hospital  Patient/family agrees with progress made and goals achieved: Yes  OT Discharge Precautions/Restrictions  Precautions Precautions: Fall Restrictions Weight Bearing Restrictions: No Pain Pain Assessment Pain Scale: Faces Faces Pain Scale: Hurts a little bit Pain Type: Acute pain Pain Location: Head Pain Descriptors / Indicators: Aching Pain Onset: On-going Pain Intervention(s): RN made aware ADL ADL Eating: Independent Grooming: Supervision/safety Where Assessed-Grooming: Edge of bed Upper Body Bathing: Supervision/safety Where  Assessed-Upper Body Bathing: Edge of bed Lower Body Bathing: Supervision/safety Where Assessed-Lower Body Bathing: Bed level Upper Body Dressing: Supervision/safety Where Assessed-Upper Body Dressing: Edge of bed Lower Body Dressing: Supervision/safety Where Assessed-Lower Body Dressing: Bed level Toileting: Supervision/safety Toilet Transfer: Close supervision Tub/Shower Transfer: Close supervison Perception  Perception: Within Functional Limits Praxis Praxis: Impaired Praxis Impairment Details: Motor planning;Perseveration Cognition Overall Cognitive Status: Impaired/Different from baseline Arousal/Alertness: Awake/alert Memory: Impaired Problem Solving: Impaired Safety/Judgment: Impaired Sensation Sensation Light Touch: Appears Intact Hot/Cold: Appears Intact Proprioception: Impaired by gross assessment Stereognosis: Appears Intact Coordination Gross Motor Movements are Fluid and Coordinated: No Fine Motor Movements are Fluid and Coordinated: No Coordination and Movement Description: mostly WFL, mild tremors Motor  Motor Motor: Abnormal postural alignment and control Motor - Discharge Observations: intention tremor vs ataxia? Mobility  Bed Mobility Bed Mobility: Rolling Right;Supine to Sit;Sit to Supine;Rolling Left Rolling Right: Supervision/verbal cueing Rolling Left: Supervision/Verbal cueing Supine to Sit: Supervision/Verbal cueing Sit to Supine: Supervision/Verbal cueing Transfers Sit to Stand: Supervision/Verbal cueing Stand to Sit: Supervision/Verbal cueing  Trunk/Postural Assessment  Cervical Assessment Cervical Assessment: Within Functional Limits Thoracic Assessment Thoracic Assessment: Within Functional Limits Lumbar Assessment Lumbar Assessment: Exceptions to [facility] (posterior pelvic tilt)  Balance Balance Balance Assessed: Yes Static Sitting Balance Static Sitting - Balance Support: Feet supported Static Sitting - Level of Assistance: 5: Stand by  assistance Dynamic Sitting Balance Dynamic Sitting - Balance Support: During functional activity;Feet supported Dynamic Sitting - Level of Assistance: 5: Stand by assistance Static Standing Balance Static Standing - Balance Support: During functional activity;No upper extremity supported Static Standing - Level of Assistance: 5: Stand by assistance Dynamic Standing Balance Dynamic Standing - Balance Support: During functional activity Dynamic Standing - Level of Assistance: 5: Stand by assistance (CGA) Extremity/Trunk Assessment RUE Assessment RUE Assessment: Within Functional Limits LUE Assessment LUE Assessment: Within  Functional Limits   Valma Cava 07/23/2020, 3:40 PM

## 2020-07-23 NOTE — Progress Notes (Signed)
Speech Language Pathology Discharge Summary  Patient Details  Name: Amy Barry MRN: [id number] Date of Birth: Jan 21, 1971  Today's Date: 07/23/2020 SLP Individual Time: 1035-1100 SLP Individual Time Calculation (min): 25 min   Skilled Therapeutic Interventions:  Patient seen with husband and daughter present for skilled ST session focusing on education. Husband is aware that [facility] agency planned for patient does not currently have an SLP working in their area. SLP discussed importance of structuring patient's day, having patient do as much as she can with her ADL's and to watch for her requesting too much help, working on organization and recall of daily events. Family in agreement and understanding of all education. Patient to be discharged from CIR tomorrow (4/22) and recommendation continues to be [facility] SLP.   Patient has met 4 of 4 long term goals.  Patient to discharge at overall Supervision level.   Reasons goals not met: N/A   Clinical Impression/Discharge Summary: Patient has made functional gains and has met 4 of 4 LTGs this admission. Currently, patient requires overall supervision-Min A verbal and visual cues to complete functional and mildly complex tasks safely regarding recall with use of strategies and problem solving. Patient requires extra time and encouragement for confidence in performance as she is demonstrating greater improvement in overall cognitive functioning than she perceives. Patient and family education is complete and patient will discharge home with 24 hour supervision from family. Patient would benefit from f/u SLP services to maximize her cognitive functioning and overall functional independence in order to reduce caregiver burden.   Care Partner:  Caregiver Able to Provide Assistance: Yes  Type of Caregiver Assistance: Physical;Cognitive  Recommendation:  Home Health SLP;24 hour supervision/assistance  Rationale for SLP Follow Up: Reduce caregiver burden;Maximize  cognitive function and independence   Equipment: N/A   Reasons for discharge: Discharged from hospital   Patient/Family Agrees with Progress Made and Goals Achieved: Yes    [place], [place] 07/23/2020, 6:51 AM   Sonia Baller, [place], CCC-SLP 07/23/20 12:47 PM

## 2020-07-23 NOTE — Plan of Care (Signed)
  Problem: Sit to Stand Goal: LTG:  Patient will perform sit to stand in prep for activites of daily living with assistance level (OT) Description: LTG:  Patient will perform sit to stand in prep for activites of daily living with assistance level (OT) Outcome: Completed/Met   Problem: RH Grooming Goal: LTG Patient will perform grooming w/assist,cues/equip (OT) Description: LTG: Patient will perform grooming with assist, with/without cues using equipment (OT) Outcome: Completed/Met   Problem: RH Bathing Goal: LTG Patient will bathe all body parts with assist levels (OT) Description: LTG: Patient will bathe all body parts with assist levels (OT) Outcome: Completed/Met   Problem: RH Dressing Goal: LTG Patient will perform upper body dressing (OT) Description: LTG Patient will perform upper body dressing with assist, with/without cues (OT). Outcome: Completed/Met Goal: LTG Patient will perform lower body dressing w/assist (OT) Description: LTG: Patient will perform lower body dressing with assist, with/without cues in positioning using equipment (OT) Outcome: Completed/Met   Problem: RH Toileting Goal: LTG Patient will perform toileting task (3/3 steps) with assistance level (OT) Description: LTG: Patient will perform toileting task (3/3 steps) with assistance level (OT)  Outcome: Completed/Met   Problem: RH Toilet Transfers Goal: LTG Patient will perform toilet transfers w/assist (OT) Description: LTG: Patient will perform toilet transfers with assist, with/without cues using equipment (OT) Outcome: Completed/Met   Problem: RH Tub/Shower Transfers Goal: LTG Patient will perform tub/shower transfers w/assist (OT) Description: LTG: Patient will perform tub/shower transfers with assist, with/without cues using equipment (OT) Outcome: Completed/Met   

## 2020-07-23 NOTE — Progress Notes (Signed)
Physical Therapy Discharge Summary  Patient Details  Name: Amy Barry MRN: [id number] Date of Birth: [birth year]  Today's Date: 07/23/2020 PT Individual Time: 8366-2947; 6546-5035 PT Individual Time Calculation (min): 55 min and 25 mins  Session 1: Patient received supine in bed, agreeable to PT. She denies pain and reports sleeping well. She continues to report poor vision/trouble focusing with her glasses on, but insisted on keeping glasses on throughout session. She was able to come sit edge of bed with CGA and verbal cues for sequencing. Once seated edge of bed, patient with persistent trunk mobility noted and larger amplitude. Patient transferring to wc via stand pivot with CGA and general instability noted. PT transporting patient in wc to therapy gym for time management and energy conservation. CGA for gait x170f with variable gait impairments noted- though consistent NBOS observed. When patient completes dual task or is distracted, minimal gait patterns observed. Patient able to negotiate 8 steps with B HR and CGA, self-selecting step to pattern. CGA when ambulating over uneven ground and for transfer into car as well. D/c assessment completed as noted below. Patient returning to room in wc, seatbelt alarm on, call light within reach.   Session 2: Patient received sitting up in wc, agreeable to PT. She denies pain. Patient ambulating 1542fwith RW and CGA to therapy gym with variable gait pattern noted again. NuStep x8 mins with B LE only on level 4 for improved coordination and reciprocal stepping in BLE. Patient returning to room in wc, seatbelt alarm on, call light within reach.    Patient has met 5 of 10 long term goals due to improved activity tolerance, improved balance, improved postural control, increased strength, decreased pain, ability to compensate for deficits, improved attention, improved awareness and improved coordination.  Patient to discharge at an ambulatory [place]  Patient's care partner is independent to provide the necessary physical and cognitive assistance at discharge.  Reasons goals not met: Patient with inconsistent presentation throughout CIR stay. At times, she will require grossly supervision. And other times, she will demonstrate larger amplitude "ataxia," which threatens her balance and thereby she requires more consistently CGA.   Recommendation:  Patient will benefit from ongoing skilled PT services in home health setting to continue to advance safe functional mobility, address ongoing impairments in coordination, gait progressions, dynamic standing balance, activity tolerance, and minimize fall risk.  Equipment: RW, transport chair   Reasons for discharge: discharge from hospital  Patient/family agrees with progress made and goals achieved: Yes  PT Discharge Precautions/Restrictions Precautions Precautions: Fall Precaution Comments: SBP < 140 Restrictions Weight Bearing Restrictions: No Vision/Perception  Vision - Assessment Additional Comments: Patient reports vision is worse with glasses vs. without glasses Perception Perception: Within Functional Limits Praxis Praxis: Impaired Praxis Impairment Details: Motor planning;Perseveration  Cognition Overall Cognitive Status: Impaired/Different from baseline Arousal/Alertness: Awake/alert Orientation Level: Oriented X4 Sustained Attention: Appears intact Memory: Impaired Awareness: Impaired Problem Solving: Impaired Safety/Judgment: Impaired Sensation Sensation Light Touch: Appears Intact (reports N/T to plantar surface of B feet) Hot/Cold: Appears Intact Proprioception: Impaired by gross assessment Stereognosis: Appears Intact Coordination Gross Motor Movements are Fluid and Coordinated: No Fine Motor Movements are Fluid and Coordinated: No Heel Shin Test: initiates tremor when starting to move, but able to complete this movement WFL B Motor  Motor Motor:  Abnormal postural alignment and control Motor - Discharge Observations: intention tremor vs ataxia?  Mobility Bed Mobility Bed Mobility: Rolling Right;Supine to Sit;Sit to Supine;Rolling Left Rolling Right: Supervision/verbal  cueing Rolling Left: Supervision/Verbal cueing Supine to Sit: Contact Guard/Touching assist Sit to Supine: Contact Guard/Touching assist Transfers Transfers: Sit to Stand;Stand to Sit;Stand Pivot Transfers Sit to Stand: Contact Guard/Touching assist Stand to Sit: Contact Guard/Touching assist Stand Pivot Transfers: Contact Guard/Touching assist Transfer (Assistive device): 1 person hand held assist Locomotion  Gait Ambulation: Yes Gait Assistance: Contact Guard/Touching assist Gait Distance (Feet): 150 Feet Assistive device: Rolling Messman Gait Assistance Details: Verbal cues for safe use of DME/AE Gait Gait: Yes Gait Pattern: Impaired Gait Pattern: Narrow base of support (variable gait impairments throughout gait bout noted) Gait velocity: decreased Stairs / Additional Locomotion Stairs: Yes Stairs Assistance: Contact Guard/Touching assist Stair Management Technique: Two rails Number of Stairs: 8 Height of Stairs: 6 Ramp: Contact Guard/touching assist Curb: Nurse, mental health Mobility: No  Trunk/Postural Assessment  Cervical Assessment Cervical Assessment: Within Functional Limits Thoracic Assessment Thoracic Assessment: Within Functional Limits Lumbar Assessment Lumbar Assessment: Exceptions to [facility] (posterior pelvic tilt) Postural Control Postural Control: Deficits on evaluation Righting Reactions: slightly delayed Protective Responses: slightly delayed  Balance Balance Balance Assessed: Yes Static Sitting Balance Static Sitting - Balance Support: Feet supported Static Sitting - Level of Assistance: 5: Stand by assistance Dynamic Sitting Balance Dynamic Sitting - Balance Support: During functional  activity;Feet supported Dynamic Sitting - Level of Assistance: 5: Stand by assistance Static Standing Balance Static Standing - Balance Support: During functional activity;No upper extremity supported Static Standing - Level of Assistance:  (CGA) Dynamic Standing Balance Dynamic Standing - Balance Support: During functional activity Dynamic Standing - Level of Assistance:  (CGA) Extremity Assessment      RLE Assessment RLE Assessment: Within Functional Limits General Strength Comments: grossly 4/5, questionable effort put forth LLE Assessment LLE Assessment: Within Functional Limits General Strength Comments: grossly 4/5, questionable effort put forth    Debbora Dus 07/23/2020, 10:11 AM

## 2020-07-23 NOTE — Progress Notes (Signed)
PROGRESS NOTE   Subjective/Complaints: Has headache but not severe. Complains of generalized lower body/leg pains.   ROS: Patient denies fever, rash, sore throat, blurred vision, nausea, vomiting, diarrhea, cough, shortness of breath or chest pain,  headache, or mood change.       Objective:   CT HEAD WO CONTRAST  Result Date: 07/21/2020 CLINICAL DATA:  Status post intervention.  Headache. EXAM: CT HEAD WITHOUT CONTRAST TECHNIQUE: Contiguous axial images were obtained from the base of the skull through the vertex without intravenous contrast. COMPARISON:  07/08/2020 FINDINGS: Brain: Unchanged area of hypoattenuation in the right frontal lobe along the course of the previously present EVD catheter. Previously seen acute hemorrhage has resolved. No new site of hemorrhage. Vascular: No hyperdense vessel or unexpected calcification. Skull: Small right frontal burr hole Sinuses/Orbits: No acute finding. Other: None. IMPRESSION: 1. Unchanged area of hypoattenuation in the right frontal lobe along the course of the previously present EVD catheter. Previously seen acute hemorrhage has resolved. 2. No new site of hemorrhage. Electronically Signed   By: Ulyses Jarred M.D.   On: 07/21/2020 19:33   No results for input(s): WBC, HGB, HCT, PLT in the last 72 hours. No results for input(s): NA, K, CL, CO2, GLUCOSE, BUN, CREATININE, CALCIUM in the last 72 hours.  Intake/Output Summary (Last 24 hours) at 07/23/2020 0927 Last data filed at 07/23/2020 0700 Gross per 24 hour  Intake 240 ml  Output --  Net 240 ml        Physical Exam: Vital Signs Blood pressure 124/78, pulse 77, temperature 98.6 F (37 C), temperature source Oral, resp. rate 20, height 5\' 4"  (1.626 m), weight 82.9 kg, last menstrual period 12/22/2017, SpO2 100 %. Constitutional: No distress . Vital signs reviewed. HEENT: EOMI, oral membranes moist Neck: supple Cardiovascular: RRR  without murmur. No JVD    Respiratory/Chest: CTA Bilaterally without wheezes or rales. Normal effort    GI/Abdomen: BS +, non-tender, non-distended Ext: no clubbing, cyanosis, or edema Psych: pleasant and cooperative Skin: scalp wounds healed.   Neuro: alert and appropriate.  Normal language, although a little slurred. Pupils reactive, EOMI.  Cranial nerves 2-12 are intact. Sensory exam is normal. Reflexes are 2+ in all 4's. Fine motor coordination is intact. No tremors. Motor function is grossly 5/5 in UE. LE: limited d/t pain proximall, 2/5 HF, KE and 4+/5 ADF/PF. Neuro exam stable 4/18 Musculoskeletal: Still bilateral thigh and LE tenderness   Assessment/Plan: 1. Functional deficits which require 3+ hours per day of interdisciplinary therapy in a comprehensive inpatient rehab setting.  Physiatrist is providing close team supervision and 24 hour management of active medical problems listed below.  Physiatrist and rehab team continue to assess barriers to discharge/monitor patient progress toward functional and medical goals  Care Tool:  Bathing    Body parts bathed by patient: Right arm,Left arm,Chest,Abdomen,Front perineal area,Buttocks,Right upper leg,Left upper leg,Face   Body parts bathed by helper: Right lower leg,Left lower leg     Bathing assist Assist Level: Minimal Assistance - Patient > 75%     Upper Body Dressing/Undressing Upper body dressing   What is the patient wearing?: Pull over shirt    Upper body  assist Assist Level: Supervision/Verbal cueing    Lower Body Dressing/Undressing Lower body dressing      What is the patient wearing?: Pants     Lower body assist Assist for lower body dressing: Minimal Assistance - Patient > 75%     Toileting Toileting Toileting Activity did not occur (Clothing management and hygiene only): N/A (no void or bm)  Toileting assist Assist for toileting: Contact Guard/Touching assist     Transfers Chair/bed  transfer  Transfers assist     Chair/bed transfer assist level: Contact Guard/Touching assist     Locomotion Ambulation   Ambulation assist      Assist level: Contact Guard/Touching assist Assistive device: [place]-rolling Max distance: 135   Walk 10 feet activity   Assist     Assist level: Contact Guard/Touching assist Assistive device: [place]-rolling   Walk 50 feet activity   Assist Walk 50 feet with 2 turns activity did not occur: Refused  Assist level: Contact Guard/Touching assist Assistive device: [place]-rolling    Walk 150 feet activity   Assist Walk 150 feet activity did not occur: Safety/medical concerns         Walk 10 feet on uneven surface  activity   Assist Walk 10 feet on uneven surfaces activity did not occur: Safety/medical concerns   Assist level: Contact Guard/Touching assist Assistive device: Aeronautical engineer Will patient use wheelchair at discharge?: No Type of Wheelchair: Manual Wheelchair activity did not occur: Refused         Wheelchair 50 feet with 2 turns activity    Assist            Wheelchair 150 feet activity     Assist          Blood pressure 124/78, pulse 77, temperature 98.6 F (37 C), temperature source Oral, resp. rate 20, height 5\' 4"  (1.626 m), weight 82.9 kg, last menstrual period 12/22/2017, SpO2 100 %.  Medical Problem List and Plan: 1.  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  Antithrombotics: -DVT/anticoagulation:  Mechanical: Sequential compression devices, below knee Bilateral lower extremities             -antiplatelet therapy: N/A 3. Pain Management: Was using NSAIDS tid at home              --will continue Oxycodone as needed  -gabapentin/kpad for myalgias, pain per home regimen   -on scheduled robaxin 750mg  qid  4/20 pain control adequate. Suspect some psychosomatic overlay as well 4. Mood: LCSW to follow for evaluation and support.                         -antipsychotic agents: N/A 5. Neuropsych: This patient is not fully capable of making decisions on her own behalf. 6. Skin/Wound Care: Routine pressure-relief measures. Should be able to remove vent clips RIgh tfrontal  7. Fluids/Electrolytes/Nutrition:    -hypokalemia: potassium up to 4.9 4/14, can dc supplement 8. Hyponatremia:                --continue 1500 cc FR   -  Na+ 133 4/18.  -off salt tabs 9.  Seizure prophylaxis: Continue Keppra twice daily.   10. HTN/Tachycardia: Monitor blood pressures 3 times daily.             --continue Nimotop every 4 hours to prevent vasospasms.             --Continue metoprolol and amlodipine                --lasix held d/t hyponatremia--continue to hold.  -4/21 bp controlled 12.  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 (Had intermittent weakness with RLE with foot drop progressing to left sided weakness--needed therapy X 2 years for recovery)             --  methotrexate--d'ced per patient request.             --now off plaquenil also    - more jt pain that she's off these meds.    -will need to d/w rheum re: plan of treatment 13.  Stress related hyperglycemia: Hgb A1c-5.4.                -sliding scale insulin for elevated blood sugars.  -cbg's ok  14.  Hypocalcemia: Continue calcium for oral supplementation 15. Bowel incontinence: now off plaquenil which causes her loose stool  -stopped softeners and laxatives  -continue probiotic, fiber. Discussed high fiber foods that her family can bring in for her  -up to toilet to empty  -added questran to help with stool consistency  -4/19 stools formed    - continue regimen 16. Fevers: afebrile    -CXR negative  4/9.  -continue to follow     LOS: 14 days A FACE TO [place] 07/23/2020, 9:27 AM

## 2020-07-24 ENCOUNTER — Other Ambulatory Visit: Payer: Self-pay | Admitting: Physical Medicine and Rehabilitation

## 2020-07-24 DIAGNOSIS — R519 Headache, unspecified: Secondary | ICD-10-CM

## 2020-07-24 DIAGNOSIS — M542 Cervicalgia: Secondary | ICD-10-CM

## 2020-07-24 MED ORDER — POTASSIUM CHLORIDE CRYS ER 20 MEQ PO TBCR: 20.0000 meq | EXTENDED_RELEASE_TABLET | Freq: Every day | ORAL | 0 refills | Status: DC

## 2020-07-24 MED ORDER — TOPIRAMATE 25 MG PO TABS: 100.0000 mg | ORAL_TABLET | Freq: Every day | ORAL | Status: DC

## 2020-07-24 MED ORDER — TOPIRAMATE 50 MG PO TABS: 50.0000 mg | ORAL_TABLET | ORAL | 1 refills | Status: DC

## 2020-07-24 MED ORDER — TOPIRAMATE 25 MG PO TABS: 50.0000 mg | ORAL_TABLET | Freq: Every day | ORAL | Status: DC

## 2020-07-24 MED ORDER — POTASSIUM CHLORIDE CRYS ER 20 MEQ PO TBCR: 20.0000 meq | EXTENDED_RELEASE_TABLET | Freq: Every day | ORAL | Status: DC

## 2020-07-24 NOTE — Discharge Summary (Signed)
Physician Discharge Summary  Patient ID: Amy Barry MRN: [id number] DOB/AGE: [birth year] 50 y.o.  Admit date: 07/09/2020 Discharge date: 07/24/2020  Discharge Diagnoses:  Principal Problem:   Subarachnoid bleed [facility]) Active Problems:   Essential hypertension   Gait abnormality   Cognitive deficits   Frontal lobe and executive function deficit   Headache   Neck pain, musculoskeletal   Hyponatremia   Discharged Condition: stable   Significant Diagnostic Studies: DG Chest 2 View  Result Date: 07/11/2020 CLINICAL DATA:  Fever. EXAM: CHEST - 2 VIEW COMPARISON:  09/16/2015 FINDINGS: Normal sized heart. Clear lungs with normal vascularity. Interval lower cervical spine hardware. IMPRESSION: No acute abnormality. Electronically Signed   By: Claudie Revering M.D.   On: 07/11/2020 16:40   CT HEAD WO CONTRAST  Result Date: 07/21/2020 CLINICAL DATA:  Status post intervention.  Headache. EXAM: CT HEAD WITHOUT CONTRAST TECHNIQUE: Contiguous axial images were obtained from the base of the skull through the vertex without intravenous contrast. COMPARISON:  07/08/2020 FINDINGS: Brain: Unchanged area of hypoattenuation in the right frontal lobe along the course of the previously present EVD catheter. Previously seen acute hemorrhage has resolved. No new site of hemorrhage. Vascular: No hyperdense vessel or unexpected calcification. Skull: Small right frontal burr hole Sinuses/Orbits: No acute finding. Other: None. IMPRESSION: 1. Unchanged area of hypoattenuation in the right frontal lobe along the course of the previously present EVD catheter. Previously seen acute hemorrhage has resolved. 2. No new site of hemorrhage. Electronically Signed   By: Ulyses Jarred M.D.   On: 07/21/2020 19:33   CT HEAD WO CONTRAST  Result Date: 07/08/2020 CLINICAL DATA:  Headache with right frontal hemorrhage noted 1 day prior EXAM: CT HEAD WITHOUT CONTRAST TECHNIQUE: Contiguous axial images were obtained from the base of the  skull through the vertex without intravenous contrast. COMPARISON:  July 07, 2020 and June 29, 2020 FINDINGS: Brain: There is a shunt catheter from a right frontal approach with tip in third ventricle, unchanged. There is hemorrhage with surrounding edema in the right frontal lobe region, unchanged from 1 day prior. The focus of hemorrhage measures 2.1 x 2.0 cm, stable. No new area of hemorrhage. Currently there is no appreciable ventricular dilatation. Sulci appear unremarkable. No appreciable mass. No subdural or epidural fluid collections. No midline shift. Note that previously noted subarachnoid hemorrhage seen on the March 2022 study is no longer evident with brain parenchyma appearance unchanged from 1 day prior. No findings suggesting acute infarct. Vascular: No hyperdense vessel. Slight calcification noted in the carotid siphon regions. Skull: The bony calvarium appears intact except for bony defect for shunt placement in the right frontal region. Sinuses/Orbits: Paranasal sinuses are clear. Orbits appear symmetric bilaterally. Other: Mastoid air cells clear. IMPRESSION: Shunt catheter tip in third ventricle without hydrocephalus. Ventricles appear unchanged compared to 1 day prior. No sulcal enlargement. Hemorrhage in the right frontal lobe surrounding a portion of the catheter is stable. No new hemorrhage evident. Interval resolution of subarachnoid hemorrhage. Gray-white differentiation unremarkable. No acute infarct evident. No extra-axial fluid collection or midline shift. Slight arterial vascular calcification noted. Electronically Signed   By: Lowella Grip III M.D.   On: 07/08/2020 08:41     Labs:  Basic Metabolic Panel: BMP Latest Ref Rng & Units 07/20/2020 07/16/2020 07/13/2020  Glucose 70 - 99 mg/dL 86 108(H) 110(H)  BUN 6 - 20 mg/dL 5(L) 6 6  Creatinine 0.44 - 1.00 mg/dL 0.67 0.65 0.63  Sodium 135 - 145 mmol/L 133(L) 132(L) 130(L)  Potassium 3.5 - 5.1 mmol/L 3.8 4.9 4.1  Chloride 98  - 111 mmol/L 108 102 102  CO2 22 - 32 mmol/L 17(L) 23 21(L)  Calcium 8.9 - 10.3 mg/dL 9.5 9.6 9.3    CBC: CBC Latest Ref Rng & Units 07/20/2020 07/10/2020 07/09/2020  WBC 4.0 - 10.5 K/uL 4.6 9.9 11.3(H)  Hemoglobin 12.0 - 15.0 g/dL 11.3(L) 12.1 11.7(L)  Hematocrit 36.0 - 46.0 % 34.6(L) 33.7(L) 34.2(L)  Platelets 150 - 400 K/uL 352 316 317    CBG: Recent Labs  Lab 07/21/20 1117 07/21/20 1628 07/21/20 2104 07/22/20 0633 07/22/20 1118  GLUCAP 104* 99 100* 100* 98    Brief HPI:   Amy Barry is a 50 y.o. female with history of HTN, IBS, connective tissue disorder, history of cerebral aneurysms who was admitted on 06/29/2020 after found down by daughter with slurred speech and confusion.  She was found to have [place] in posterior fossa felt to be aneurysmal in nature.  CTA head/neck did not show a definite source of bleeding but poor visualization of proximal left-ICA with tiny bulbous area.  She underwent cerebral angiogram (which was negative for AVM, aneurysm, dissection or fistula) with placement of right frontal EVD by Dr. Kathyrn Sheriff.   She has had bouts of lethargy and did develop small hematoma along course of brain and right frontal lobe. Serial CT head negative for hydrocephalus. As SAH and IVH resolved, intraventricular drain was removed on 04/06 and repeat cerebral angiogram was negative for aneurysm, fistula.  Patient with progressive hyponatremia, transient hypokalemia, issues with headaches and neck stiffness as well as back spasms.  Lethargy was resolving with improvement in mentation however she was noted to have cognitive deficits with delayed processing, difficulty following one step commands, problems with ADLs as well as balance deficits affecting mobility. CIR was recommended due to functional decline.    Hospital Course: Amy Barry was admitted to rehab 07/09/2020 for inpatient therapies to consist of PT, ST and OT at least three hours five days a week. Past admission  physiatrist, therapy team and rehab RN have worked together to provide customized collaborative inpatient rehab.  Her blood pressures were monitored on TID basis and Nimotop was weaned off per protocol.  Lasix was held due to hyponatremia and she was placed on fluid restriction.  Serial check of electrolytes showed hyponatremia improving therefore salt tabs were discontinued.  Her blood pressures have been reasonably controlled on metoprolol and amlodipine.  She was noted to have low-grade fever past admission and CXR/UA done was negative for infection.  BLE Dopplers were negative for DVT and she defervesced without antibiotics.   Pain control has been an issue.  She was maintained on oxycodone, Robaxin as well as lidocaine patches to traps/scalenes to help with headaches.  Voltaren gel was added to help with musculoskeletal pain however patient has frequently declined this..  Topamax was added and titrated upwards to help manage headaches.  This was weaned down and oxycodone was decreased due to bouts of lethargy.  Follow-up CT head showed no change in hypoattenuation in right frontal lobe and that acute hemorrhage had resolved.  Her blood sugars were monitored with ac/hs CBG checks due to stress related hyperglycemia and SSI was use prn for tighter BS control.  Her p.o. intake has been good and blood sugars are reasonably controlled therefore this was DC'd on 04/20.    She reported diarrhea with bowel incontinence felt to be due to Plaquenil.  She reported that she  was not taking Plaquenil or methotrexate at home therefore these were discontinued.  Probiotics fiber and Questran was added to help with bowel control and loose stools.  As stools were more formed Questran was discontinued at discharge.  She was noted to have significant hypokalemia at admission requiring runs of K and was started on oral supplementation additionally.  As hypokalemia had resolved, this was briefly discontinued on 04/20 but  follow-up labs showed potassium to be trending back down this was resumed at discharge.  She has made good gains during her stay and contact-guard assistance is recommended due to intermittent episodes of ataxia that affecting balance and safety.  She will continue to receive follow-up home health PT, OT and speech therapy by advanced home care after discharge.   Rehab course: During patient's stay in rehab weekly team conferences were held to monitor patient's progress, set goals and discuss barriers to discharge. At admission, patient required max assist with basic ADL task and mod assist with mobility.  She exhibited mild to moderate cognitive impairments as well as mild pragmatic/social language impairments.  She  has had improvement in activity tolerance, balance, postural control as well as ability to compensate for deficits. She is able to complete ADL tasks with use of AE and close supervision.  She requires supervision to contact-guard assist for transfers and contact-guard to min assist for ambulating 150 feet with rolling Gander.  Disposition: home  Diet: Low fat/Low cholesterol.    Special Instructions: 1.  No driving or strenuous activity till cleared by MD. 2.  Family to assist with medication management. 3. Recommend repeat BMET in 1-2 weeks to monitor sodium and potassium levels.    Discharge Instructions    Ambulatory referral to Physical Medicine Rehab   Complete by: As directed      Allergies as of 07/24/2020      Reactions   Imitrex [sumatriptan] Other (See Comments)   High blood pressure   Demerol [meperidine] Nausea And Vomiting   Oxycodone Other (See Comments)   DROWSY   Reglan [metoclopramide] Other (See Comments)   "shakiness"   Tramadol Nausea Only   Codeine Palpitations   Propoxyphene N-acetaminophen Nausea And Vomiting      Medication List    STOP taking these medications   docusate sodium 50 MG capsule Commonly known as: COLACE   furosemide 40 MG  tablet Commonly known as: LASIX   hydroxychloroquine 200 MG tablet Commonly known as: PLAQUENIL   methotrexate 2.5 MG tablet   niMODipine 6 MG/ML Soln Commonly known as: NYMALIZE   omeprazole 40 MG capsule Commonly known as: PRILOSEC   ondansetron 4 MG tablet Commonly known as: ZOFRAN   potassium chloride 10 MEQ tablet Commonly known as: KLOR-CON   TURMERIC PO     TAKE these medications   acetaminophen-codeine 300-30 MG tablet Commonly known as: TYLENOL #3 Take 1-2 tablets by mouth every 4 (four) hours as needed for moderate pain. Notes to patient: YOU CAN USE THIS OR REGULAR TYLENOL--DO NOT USE BOTH TOGETHER.--MAX 6 PILLS PER DAY   amLODipine 5 MG tablet Commonly known as: NORVASC Take 5 mg by mouth daily.   diclofenac Sodium 1 % Gel Commonly known as: VOLTAREN Apply 2 g topically 4 (four) times daily. Notes to patient: CAN USE TO THREE DIFFERENT JOINTS   folic acid 1 MG tablet Commonly known as: FOLVITE Take 1 mg by mouth daily.   gabapentin 300 MG capsule Commonly known as: NEURONTIN Take 1 capsule (300 mg total) by  mouth at bedtime. What changed:   when to take this  reasons to take this   levETIRAcetam 100 MG/ML solution Commonly known as: KEPPRA Take 5 mLs (500 mg total) by mouth 2 (two) times daily. Notes to patient: TO PREVENT SEIZURES   lidocaine 5 % Commonly known as: LIDODERM Place 2 patches onto the skin daily. Apply at 8pm and remove at 8 pm--to shoulder/neck. (Purchase this over the counter as insurance usually does not cover this) Notes to patient: HAS TO BE OFF FOR 12 HOURS   methocarbamol 500 MG tablet Commonly known as: ROBAXIN Take 1 tablet (500 mg total) by mouth 4 (four) times daily.   metoprolol tartrate 100 MG tablet Commonly known as: LOPRESSOR TAKE ONE TABLET BY MOUTH TWICE DAILY   multivitamin with minerals Tabs tablet Take 1 tablet by mouth daily.   Muscle Rub 10-15 % Crea Apply 1 application topically 2 (two) times  daily as needed for muscle pain.   oxyCODONE 5 MG immediate release tablet--Rx#20 pills Commonly known as: Oxy IR/ROXICODONE Take 1 tablet (5 mg total) by mouth every 6 (six) hours as needed for severe pain.   pantoprazole 40 MG tablet Commonly known as: PROTONIX Take 40 mg by mouth daily as needed (for acid reflux/indigestion).   polycarbophil 625 MG tablet Commonly known as: FIBERCON Take 1 tablet (625 mg total) by mouth daily. Notes to patient: OTC   potassium chloride SA 20 MEQ tablet Commonly known as: KLOR-CON Take 1 tablet (20 mEq total) by mouth daily.   saccharomyces boulardii 250 MG capsule Commonly known as: FLORASTOR Take 1 capsule (250 mg total) by mouth 2 (two) times daily. Notes to patient: OTC   simethicone 80 MG chewable tablet Commonly known as: MYLICON Chew 1 tablet (80 mg total) by mouth 4 (four) times daily as needed for flatulence. Notes to patient: OTC   topiramate 50 MG tablet Commonly known as: TOPAMAX Take 1 tablet (50 mg total) by mouth as directed. Take 1 tab in morning and 2 tabs at bedtime   traZODone 50 MG tablet Commonly known as: DESYREL Take 1 tablet (50 mg total) by mouth at bedtime. Notes to patient: FOR SLEEP       Follow-up Information    Meredith Staggers, MD Follow up.   Specialty: Physical Medicine and Rehabilitation Why: Office will call you with follow up appt Contact information: [address] [place] [address] [phone]        Judith Part, MD. Call on 07/27/2020.   Specialty: Neurosurgery Why: for post op appointment Contact information: [place] [address] [phone]        Donald Prose, MD. Call on 07/27/2020.   Specialty: Family Medicine Why: for post hospital follow up Contact information: [place] [address] [phone]               Signed: Bary Leriche 07/28/2020, 5:26 PM

## 2020-07-24 NOTE — Progress Notes (Signed)
PROGRESS NOTE   Subjective/Complaints: Headache present but not bad. Other generalized body aches. Anxious but happy about going home today  ROS: Patient denies fever, rash, sore throat, blurred vision, nausea, vomiting, diarrhea, cough, shortness of breath or chest pain,  or mood change.      Objective:   No results found. No results for input(s): WBC, HGB, HCT, PLT in the last 72 hours. No results for input(s): NA, K, CL, CO2, GLUCOSE, BUN, CREATININE, CALCIUM in the last 72 hours.  Intake/Output Summary (Last 24 hours) at 07/24/2020 0918 Last data filed at 07/24/2020 0804 Gross per 24 hour  Intake 740 ml  Output --  Net 740 ml        Physical Exam: Vital Signs Blood pressure 102/69, pulse 72, temperature 98.7 F (37.1 C), temperature source Oral, resp. rate 20, height 5\' 4"  (1.626 m), weight 82.9 kg, last menstrual period 12/22/2017, SpO2 99 %. Constitutional: No distress . Vital signs reviewed. HEENT: EOMI, oral membranes moist Neck: supple Cardiovascular: RRR without murmur. No JVD    Respiratory/Chest: CTA Bilaterally without wheezes or rales. Normal effort    GI/Abdomen: BS +, non-tender, non-distended Ext: no clubbing, cyanosis, or edema Psych: pleasant and cooperative, sl anxious Skin: scalp wounds healed.   Neuro: alert and appropriate.  Normal language, although a little slurred. Pupils reactive, EOMI.  Cranial nerves 2-12 are intact. Sensory exam is normal. Reflexes are 2+ in all 4's. Fine motor coordination is intact. No tremors. Motor function is grossly 5/5 in UE. LE: limited d/t pain proximall, 2-3+/5 HF, KE and 4+/5 ADF/PF. Musculoskeletal: Still bilateral thigh and LE tenderness with AROM/PROM   Assessment/Plan: 1. Functional deficits which require 3+ hours per day of interdisciplinary therapy in a comprehensive inpatient rehab setting.  Physiatrist is providing close team supervision and 24 hour  management of active medical problems listed below.  Physiatrist and rehab team continue to assess barriers to discharge/monitor patient progress toward functional and medical goals  Care Tool:  Bathing    Body parts bathed by patient: Right arm,Left arm,Chest,Abdomen,Front perineal area,Buttocks,Right upper leg,Left upper leg,Face,Right lower leg,Left lower leg   Body parts bathed by helper: Right lower leg,Left lower leg     Bathing assist Assist Level: Supervision/Verbal cueing Assistive Device Comment: LH sponge   Upper Body Dressing/Undressing Upper body dressing   What is the patient wearing?: Pull over shirt    Upper body assist Assist Level: Set up assist    Lower Body Dressing/Undressing Lower body dressing      What is the patient wearing?: Pants,Underwear/pull up     Lower body assist Assist for lower body dressing: Supervision/Verbal cueing Assistive Device Comment: reacher   Toileting Toileting Toileting Activity did not occur (Clothing management and hygiene only): N/A (no void or bm)  Toileting assist Assist for toileting: Supervision/Verbal cueing     Transfers Chair/bed transfer  Transfers assist     Chair/bed transfer assist level: Supervision/Verbal cueing     Locomotion Ambulation   Ambulation assist      Assist level: Contact Guard/Touching assist Assistive device: Ratz-rolling Max distance: 150   Walk 10 feet activity   Assist     Assist  level: Contact Guard/Touching assist Assistive device: Beeks-rolling   Walk 50 feet activity   Assist Walk 50 feet with 2 turns activity did not occur: Refused  Assist level: Contact Guard/Touching assist Assistive device: Clavijo-rolling    Walk 150 feet activity   Assist Walk 150 feet activity did not occur: Safety/medical concerns  Assist level: Contact Guard/Touching assist Assistive device: Thurow-rolling    Walk 10 feet on uneven surface  activity   Assist Walk 10  feet on uneven surfaces activity did not occur: Safety/medical concerns   Assist level: Contact Guard/Touching assist Assistive device: Aeronautical engineer Will patient use wheelchair at discharge?: No Type of Wheelchair: Manual Wheelchair activity did not occur: Refused         Wheelchair 50 feet with 2 turns activity    Assist            Wheelchair 150 feet activity     Assist          Blood pressure 102/69, pulse 72, temperature 98.7 F (37.1 C), temperature source Oral, resp. rate 20, height 5\' 4"  (1.626 m), weight 82.9 kg, last menstrual period 12/22/2017, SpO2 99 %.  Medical Problem List and Plan: 1.  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 2.  Antithrombotics: -DVT/anticoagulation:  Mechanical: Sequential compression devices, below knee Bilateral lower extremities             -antiplatelet therapy: N/A 3. Pain Management/headaches: Was using NSAIDS tid at home             --will continue Oxycodone as needed  -gabapentin/kpad for myalgias, pain per home regimen   -on scheduled robaxin 750mg  qid  4/21adjust topamax to 50qam and 100qpm. Suspect some psychosomatic overlay   4. Mood: LCSW to follow for evaluation and support.                         -antipsychotic agents: N/A 5. Neuropsych: This patient is not fully capable of making decisions on her own behalf. 6. Skin/Wound Care: Routine pressure-relief measures. Should be able to remove vent clips RIgh tfrontal  7. Fluids/Electrolytes/Nutrition:    -hypokalemia: potassium up to 4.9 4/14 but down to 3.8 4/18   -send home on 20 meq daily of KDUR 8. Hyponatremia:                --continue 1500 cc FR   -Na+ 133 4/18.  -off salt tabs  -f/u as outpt 9.  Seizure prophylaxis: Continue Keppra  twice daily.   10. HTN/Tachycardia: Monitor blood pressures 3 times daily.             -- Nimotop completed             --Continue metoprolol and amlodipine                --lasix held d/t hyponatremia--continue to hold.  -4/22 bp controlled 12.  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 (Had intermittent weakness with RLE with foot drop progressing to left sided weakness--needed therapy X 2 years for recovery)             --  methotrexate--d'ced per patient request.             --  now off plaquenil also    - more jt pain that she's off these meds.    -will need to d/w rheum re: plan of treatment as outpt 13.  Stress related hyperglycemia: Hgb A1c-5.4.                -sliding scale insulin for elevated blood sugars.  -cbg's ok  14.  Hypocalcemia: Continue calcium for oral supplementation 15. Bowel incontinence: now off plaquenil which causes her loose stool  -stopped softeners and laxatives  -continue probiotic, fiber. Discussed high fiber foods that her family can bring in for her  -up to toilet to empty  -added questran to help with stool consistency  -4/22 stools formed    - dc questran 16. Fevers: resolved    LOS: 15 days A FACE TO FACE EVALUATION WAS PERFORMED  Meredith Staggers 07/24/2020, 9:18 AM

## 2020-07-24 NOTE — Progress Notes (Signed)
Inpatient Rehabilitation Care Coordinator Discharge Note  The overall goal for the admission was met for:   Discharge location: Yes. D/c to home with 24/7 care from family.   Length of Stay: Yes. 14 days.   Discharge activity level: Yes. Minimal Assistance.  Home/community participation: Yes. Limited.   Services provided included: MD, RD, PT, OT, SLP, RN, CM, TR, Pharmacy, Neuropsych and SW  Financial Services: Private Insurance: Airline pilot (preferred) and Other: [employer]  Choices offered to/list presented to:Yes  Follow-up services arranged: Home Health: [facility] for HHPT/OT/SLP, DME: [facility] for TTB and 3in1 BSC and Patient/Family request agency HH: [facility] and [facility], DME: N/A  Comments (or additional information):  Patient/Family verbalized understanding of follow-up arrangements: Yes  Individual responsible for coordination of the follow-up plan: contact pt husband [name] [phone]  Confirmed correct DME delivered: Rana Snare 07/24/2020    Rana Snare

## 2020-07-24 NOTE — Progress Notes (Signed)
INPATIENT REHABILITATION DISCHARGE NOTE   Discharge instructions by: w/c   Verbalized understanding: yes   Skin care/Wound care: none   Pain: managed with three pain meds   IV's: none   Tubes/Drains: none   Safety instructions: given by Reesa Chew PA-C  Patient belongings: given to husband   Discharged to: home   Discharged via: Wheelchair   Notes: Patient discharged to home at 9:52 am

## 2020-07-24 NOTE — Discharge Instructions (Signed)
Inpatient Rehab Discharge Instructions  Amy Barry Discharge date and time:  07/24/20  Activities/Precautions/ Functional Status: Activity: no lifting, driving, or strenuous exercise  till cleared by MD Diet: low fat, low cholesterol diet Wound Care: none needed    Functional status:  ___ No restrictions     ___ Walk up steps independently _X__ 24/7 supervision/assistance   ___ Walk up steps with assistance ___ Intermittent supervision/assistance  ___ Bathe/dress independently ___ Walk with Evangelist     _X__ Bathe/dress with assistance ___ Walk Independently    ___ Shower independently _X__ Walk with assistance    ___ Shower with assistance _X__ No alcohol     ___ Return to work/school ________  COMMUNITY REFERRALS UPON DISCHARGE:    Home Health:   PT     OT     ST                   Agency: [facility] Phone: [phone] *Please expect start of care to begin on Monday (4/25). If you do not receive follow-up on Monday, be sure to contact the branch directly.*   Medical Equipment/Items Ordered: tub transfer bench and 3in1 bedside commode                                                 Agency/Supplier: [facility] [phone]    Special Instructions:    My questions have been answered and I understand these instructions. I will adhere to these goals and the provided educational materials after my discharge from the hospital.  Patient/Caregiver Signature _______________________________ Date __________  Clinician Signature _______________________________________ Date __________  Please bring this form and your medication list with you to all your follow-up doctor's appointments.

## 2020-07-24 NOTE — Progress Notes (Signed)
Patient ID: Amy Barry, female   DOB: [birth year], 50 y.o.   MRN: [id number]  SW received updates from [facility] the branch accepted referral for HHPT/OT/SLP for [facility] on Monday. SW called pt husband [name] [phone]) to provide updates; he stated he was on his way to the room.  SW updated pt, pt husband, and pt dtr [place] on [facility] being [place] care and [facility] will be on Monday .   SW faxed orders to [facility] [phone].  Loralee Pacas, MSW, [place] Office: [phone] Cell: [phone] Fax: [phone]

## 2020-07-28 ENCOUNTER — Telehealth: Payer: Self-pay

## 2020-07-28 DIAGNOSIS — E871 Hypo-osmolality and hyponatremia: Secondary | ICD-10-CM

## 2020-07-28 MED ORDER — RIZATRIPTAN BENZOATE 5 MG PO TABS: 5.0000 mg | ORAL_TABLET | Freq: Once | ORAL | 2 refills | Status: DC | PRN

## 2020-07-28 NOTE — Telephone Encounter (Signed)
Per protocol [facility] [place] chart was reviewed for home health services.  A verbal order was given to [facility])  for  in-home Occupational Therapy. Service to be twice a week for one week.   Call back ph# [phone].

## 2020-07-28 NOTE — Telephone Encounter (Signed)
Pts husband called and has questions since they left the hospital , wife has been having seizure like scenarios he said her seizure medicine was lowered down and wants to know if he can increase it or why is that happening .

## 2020-07-28 NOTE — Telephone Encounter (Signed)
These episodes (per daughter) have been preceded by increased headache.  They may be complicated migraines. She remains on the same dose of keppra and topamax as she was on in the hospital. There also may be an anxiety component.   1.Sent in an rx for maxalt (?bp elevation with imitrex). Continue keppra, topamax 2.Encouraged family to work on relaxation techniques, distractions from pain as I think anxiety plays a role.  3. If these persist over the next 24+ hours without improvement, she needs to be seen in the ED to r/o seizure activity.

## 2020-08-03 ENCOUNTER — Telehealth: Payer: Self-pay | Admitting: *Deleted

## 2020-08-03 NOTE — Telephone Encounter (Signed)
Amy Barry [facility] called for POC 1wk9 to work on cognition.  Approval given.

## 2020-08-05 ENCOUNTER — Other Ambulatory Visit: Payer: Self-pay

## 2020-08-05 ENCOUNTER — Encounter
Payer: No Typology Code available for payment source | Attending: Physical Medicine & Rehabilitation | Admitting: Physical Medicine & Rehabilitation

## 2020-08-05 ENCOUNTER — Encounter: Payer: Self-pay | Admitting: Physical Medicine & Rehabilitation

## 2020-08-05 VITALS — BP 116/77 | HR 68 | Temp 98.8°F | Ht 64.0 in | Wt 176.0 lb

## 2020-08-05 DIAGNOSIS — I609 Nontraumatic subarachnoid hemorrhage, unspecified: Secondary | ICD-10-CM | POA: Diagnosis not present

## 2020-08-05 DIAGNOSIS — R41844 Frontal lobe and executive function deficit: Secondary | ICD-10-CM

## 2020-08-05 DIAGNOSIS — G441 Vascular headache, not elsewhere classified: Secondary | ICD-10-CM | POA: Diagnosis not present

## 2020-08-05 DIAGNOSIS — M353 Polymyalgia rheumatica: Secondary | ICD-10-CM | POA: Diagnosis present

## 2020-08-05 DIAGNOSIS — R269 Unspecified abnormalities of gait and mobility: Secondary | ICD-10-CM | POA: Diagnosis not present

## 2020-08-05 MED ORDER — TOPIRAMATE 100 MG PO TABS: 100.0000 mg | ORAL_TABLET | Freq: Two times a day (BID) | ORAL | 2 refills | Status: DC

## 2020-08-05 NOTE — Patient Instructions (Addendum)
PLEASE FEEL FREE TO CALL OUR OFFICE WITH ANY PROBLEMS OR QUESTIONS ([phone])   MELATONIN 5-10MG  AT BEDTIME FOR SLEEP.   Stop gabapentin

## 2020-08-05 NOTE — Progress Notes (Signed)
Subjective:    Patient ID: Amy Barry, female    DOB: 04-09-1970, 50 y.o.   MRN: [id number]  HPI  Amy Barry is here in follow up of her posterior fossa hemorrhage and associated deficits. This is a transitional care visit.  Home therapy has begun to come out to her house.  Patient is doing some activity on her own with her family's assistance at home as well.  She is moving a bit better but still often is limited by pain and fatigue.  She has follow-up with rheumatology regarding her connective tissue disorder.  Lab work has been ordered.  I talked to Amy Barry's daughter last week as she was having severe headaches followed by a period where she is less responsive for a few minutes.  Patient reports that sometimes she can lose focus easily and then "zoned out".  The seem to be happening at different times of the day.  She remains on Topamax 100 mg at bedtime to 50 mg in the morning for headache prophylaxis.  When I spoke with her daughter last week I went ahead and started Maxalt 5 mg as needed headaches.  She is typically taking this twice a day and reports improvement in the headaches and these episodes.  We have kept [place] on board for seizure prophylaxis although she is never had a documented seizure.  Sleep has been fair to good.  He still has hip and thigh pain.  Appetite is fair though she still does not have her regular taste back since this hospitalization.  The Topamax did not change the quality of her taste either once we started it.  Bowel movements have been more formed.  I asked the patient and her husband about anxiety and this seems to be an ongoing factor also.  Amy Barry is very worried about her appearance and her ongoing deficits.  Her appearance others often can lead to increased anxiety and sometimes depression.  Husband and family seem to remain very supportive of her.    Pain Inventory Average Pain 9 Pain Right Now 7 My pain is stabbing  LOCATION OF PAIN   head  BOWEL Number of stools per week: 3 Oral laxative use Yes  Type of laxative  Enema or suppository use No  History of colostomy No  Incontinent No   BLADDER Pads In and out cath, frequency  Able to self cath na Bladder incontinence Yes  Frequent urination No  Leakage with coughing No  Difficulty starting stream No  Incomplete bladder emptying No    Mobility ability to climb steps?  yes do you drive?  no  Function employed # of hrs/week just out of work I need assistance with the following:  dressing, bathing, toileting, meal prep, household duties and shopping  Neuro/Psych bladder control problems weakness numbness confusion  Prior Studies TC appt  Physicians involved in your care TC appt   Family History  Problem Relation Age of Onset  . Arthritis Other   . Hyperlipidemia Other   . Hypertension Daughter   . Colon polyps Daughter 9       hermatoma oversized polyps-benign  . Stroke Other   . Leukemia Maternal Grandmother   . Heart disease Maternal Grandmother   . Asthma Sister   . Hypertension Mother        hx aneursym and surgery  . Atrial fibrillation Mother   . Sarcoidosis Mother   . Healthy Father   . Colon cancer Neg Hx  Social History   Socioeconomic History  . Marital status: Married    Spouse name: Not on file  . Number of children: 2  . Years of education: College  . Highest education level: Not on file  Occupational History  . Occupation: Programmer, multimedia: [employer]  Tobacco Use  . Smoking status: Never Smoker  . Smokeless tobacco: Never Used  Vaping Use  . Vaping Use: Never used  Substance and Sexual Activity  . Alcohol use: No    Alcohol/week: 0.0 standard drinks  . Drug use: Never  . Sexual activity: Yes    Partners: Male    Birth control/protection: Surgical  Other Topics Concern  . Not on file  Social History Narrative   Lives at home with husband.   Right-handed.   Occasional caffeine use.   Social  Determinants of Health   Financial Resource Strain: Not on file  Food Insecurity: Not on file  Transportation Needs: Not on file  Physical Activity: Not on file  Stress: Not on file  Social Connections: Not on file   Past Surgical History:  Procedure Laterality Date  . CERVICAL DISC ARTHROPLASTY N/A 10/23/2015   Procedure: Cervical Disc Arthroplasty Cervical six-seven;  Surgeon: Eustace Moore, MD;  Location: [place] [facility];  Service: Neurosurgery;  Laterality: N/A;  . D & C HYSTEROSCOPY W/ RESECTION ENDOMETRIAL POLYP  10-13-2003    dr rivard @[facility]   . ESOPHAGOGASTRODUODENOSCOPY     for GERD  . IR ANGIO INTRA EXTRACRAN SEL COM CAROTID INNOMINATE BILAT MOD SED  07/06/2020  . IR ANGIO INTRA EXTRACRAN SEL INTERNAL CAROTID BILAT MOD SED  06/29/2020  . IR ANGIO VERTEBRAL SEL VERTEBRAL BILAT MOD SED  06/29/2020  . IR ANGIO VERTEBRAL SEL VERTEBRAL BILAT MOD SED  07/06/2020  . LAPAROSCOPIC ABDOMINAL EXPLORATION  1996   dx ibs  . OVARIAN CYST SURGERY  age 2   x2 cyst  . RADIOLOGY WITH ANESTHESIA N/A 06/29/2020   Procedure: IR WITH ANESTHESIA;  Surgeon: Radiologist, Medication, MD;  Location: [place];  Service: Radiology;  Laterality: N/A;  . ROBOTIC ASSISTED TOTAL HYSTERECTOMY N/A 12/13/2017   Procedure: XI ROBOTIC ASSISTED TOTAL HYSTERECTOMYWITH BILATERAL SALPINGECTOMY;  Surgeon: Christophe Louis, MD;  Location: [facility];  Service: Gynecology;  Laterality: N/A;  . TUBAL LIGATION Bilateral 06-01-2002   dr Mancel Bale @[facility]    PPTL   Past Medical History:  Diagnosis Date  . Arthralgia   . Generalized weakness    bilateral upper and lower extremities,  walks with cane  . GERD (gastroesophageal reflux disease)   . Headache(784.0)   . Heart murmur    per pt "since childhood, no problems"  . History of kidney stones   . Hypertension    followed pcp  . IBS (irritable bowel syndrome)   . Iron deficiency anemia    12-06-2017 per pt recently had IV iron infusion July 2019  . Low vitamin D level   . Osteoarthritis   .  PVC (premature ventricular contraction)    per pt had holter monitor  . SLE (systemic lupus erythematosus) ([place])    rheumotologist-- dr syed  . Wears glasses    BP 116/77   Pulse 68   Temp 98.8 F (37.1 C)   Ht 5\' 4"  (1.626 m)   Wt 176 lb (79.8 kg)   LMP 12/22/2017   SpO2 96%   BMI 30.21 kg/m   Opioid Risk Score:   Fall Risk Score:  `1  Depression screen [facility] 2/9  Depression screen PHQ 2/9 03/04/2019  Decreased Interest 0  Down, Depressed, Hopeless 0  PHQ - 2 Score 0  Some recent data might be hidden    Review of Systems  Neurological: Positive for weakness, numbness and headaches.  Psychiatric/Behavioral: Positive for confusion.  All other systems reviewed and are negative.      Objective:   Physical Exam  Gen: no distress, normal appearing HEENT: oral mucosa pink and moist, NCAT Cardio: Reg rate Chest: normal effort, normal rate of breathing Abd: soft, non-distended Ext: no edema Psych: pleasant, anxious at times Skin: intact Neuro: Alert and oriented x 3. Normal insight and awareness. STM deficits and some difficulties with concentration and focus.. Normal language and speech. Cranial nerve exam unremarkable. UE 4/5. LE: 2+ prox to 4/5 distally.  Inconsistent sensory exam in both lower extremities. Musculoskeletal: bilateral hip, thigh pain with palpation as well as active range of motion.        Assessment & Plan:  1.  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  -continue with HH therapies  -family is supportive.   -transition to outpt therapies, aquatic therapy at some point  -Encouraged as much activity as possible as she is recovering.  This will be nightly helpful for her muscle strength but for her pain is well.  2.  Pain management  -Oxycodone as needed  - topical modalities for myalgias, Robaxin can be changed to as needed  -Topamax for  headaches  -DC gabapentin 3.  Seizure prophylaxis: Keppra probably no longer needed but will defer to Dr. Zada Finders as there is a possibily these episodes she has been experiencing could reflect seizure activity although I feel they are more migraine and anxiety related in addition to her concentration/cognitive deficits 4.  Connective tissue disorder: Followed by Dr. Amil Amen  -recent labs pending  -med regimen to be determined 5. Migraine/vascular headaches  -increase topamax to 100mg  bid  -Maxalt 5 mg 1 to 2 tablets daily as needed for migraine headaches  Handicap parking placard form was completed today.  25 minutes of face to face patient care time were spent during this visit. All questions were encouraged and answered.  Follow up with me in 2 mos .

## 2020-08-06 ENCOUNTER — Other Ambulatory Visit: Payer: Self-pay | Admitting: Physical Medicine & Rehabilitation

## 2020-08-10 ENCOUNTER — Other Ambulatory Visit: Payer: Self-pay | Admitting: Physical Medicine and Rehabilitation

## 2020-08-15 ENCOUNTER — Other Ambulatory Visit: Payer: Self-pay | Admitting: Physical Medicine & Rehabilitation

## 2020-08-15 ENCOUNTER — Other Ambulatory Visit: Payer: Self-pay | Admitting: Physical Medicine and Rehabilitation

## 2020-08-19 ENCOUNTER — Telehealth: Payer: Self-pay | Admitting: *Deleted

## 2020-08-19 NOTE — Telephone Encounter (Signed)
Geralyn PT [facility] called to ext PT 1 wk5.  Approval given.

## 2020-08-20 ENCOUNTER — Other Ambulatory Visit: Payer: Self-pay | Admitting: Physical Medicine and Rehabilitation

## 2020-09-11 ENCOUNTER — Other Ambulatory Visit: Payer: Self-pay | Admitting: Physical Medicine & Rehabilitation

## 2020-09-13 ENCOUNTER — Other Ambulatory Visit: Payer: Self-pay | Admitting: Physical Medicine & Rehabilitation

## 2020-09-15 ENCOUNTER — Other Ambulatory Visit: Payer: Self-pay | Admitting: Physical Medicine & Rehabilitation

## 2020-09-24 ENCOUNTER — Telehealth: Payer: Self-pay | Admitting: Physical Medicine & Rehabilitation

## 2020-09-24 NOTE — Telephone Encounter (Signed)
[place] with [facility] needs to get verbal orders for ST 1w4 starting week of June 26.  Please call her at [phone].

## 2020-09-24 NOTE — Telephone Encounter (Signed)
Approval given

## 2020-09-25 ENCOUNTER — Telehealth: Payer: Self-pay | Admitting: Physical Medicine & Rehabilitation

## 2020-09-25 NOTE — Telephone Encounter (Signed)
Orders approved, Geralyn aware.

## 2020-09-25 NOTE — Telephone Encounter (Signed)
Amy Barry PT with [facility] need to recert pt and see her 1w4 then transfer to outpatient therapy.  Please call her at [phone].

## 2020-09-30 ENCOUNTER — Encounter
Payer: No Typology Code available for payment source | Attending: Physical Medicine & Rehabilitation | Admitting: Physical Medicine & Rehabilitation

## 2020-09-30 DIAGNOSIS — G441 Vascular headache, not elsewhere classified: Secondary | ICD-10-CM | POA: Insufficient documentation

## 2020-09-30 DIAGNOSIS — R41844 Frontal lobe and executive function deficit: Secondary | ICD-10-CM | POA: Insufficient documentation

## 2020-09-30 DIAGNOSIS — M353 Polymyalgia rheumatica: Secondary | ICD-10-CM | POA: Insufficient documentation

## 2020-09-30 DIAGNOSIS — R269 Unspecified abnormalities of gait and mobility: Secondary | ICD-10-CM | POA: Insufficient documentation

## 2020-09-30 DIAGNOSIS — I609 Nontraumatic subarachnoid hemorrhage, unspecified: Secondary | ICD-10-CM | POA: Insufficient documentation

## 2020-10-09 ENCOUNTER — Other Ambulatory Visit (HOSPITAL_COMMUNITY): Payer: Self-pay | Admitting: Physician Assistant

## 2020-10-09 ENCOUNTER — Other Ambulatory Visit: Payer: Self-pay | Admitting: Physical Medicine & Rehabilitation

## 2020-10-09 DIAGNOSIS — I609 Nontraumatic subarachnoid hemorrhage, unspecified: Secondary | ICD-10-CM

## 2020-10-16 ENCOUNTER — Other Ambulatory Visit: Payer: Self-pay | Admitting: Physical Medicine & Rehabilitation

## 2020-10-20 ENCOUNTER — Other Ambulatory Visit: Payer: Self-pay | Admitting: Physical Medicine & Rehabilitation

## 2020-10-20 DIAGNOSIS — G441 Vascular headache, not elsewhere classified: Secondary | ICD-10-CM

## 2020-10-22 ENCOUNTER — Telehealth: Payer: Self-pay | Admitting: *Deleted

## 2020-10-22 NOTE — Telephone Encounter (Signed)
Amy Barry PT [facility] called for extension of PT POC 1wk5 and states that Amy Barry is interested in mental health counseling due to her disease and effects on her life.  Please advise.

## 2020-10-26 NOTE — Telephone Encounter (Signed)
Per Dr Naaman Plummer, "Extending PT is fine. As far as the other is concerned, this patient needs to make her scheduled appointments. We would have likely discussed this if she had come a couple weeks ago. "  I have let [name] know.  Pts next appt is 12/16/20.

## 2020-10-29 ENCOUNTER — Telehealth: Payer: Self-pay

## 2020-10-29 NOTE — Telephone Encounter (Signed)
Shelda Pal, [place] from [facility] called requesting verbal orders for [facility] 1wk3 starting August 1. Orders approved and given.

## 2020-11-04 ENCOUNTER — Encounter
Payer: No Typology Code available for payment source | Attending: Physical Medicine & Rehabilitation | Admitting: Physical Medicine & Rehabilitation

## 2020-11-04 ENCOUNTER — Other Ambulatory Visit: Payer: Self-pay

## 2020-11-04 ENCOUNTER — Encounter: Payer: Self-pay | Admitting: Physical Medicine & Rehabilitation

## 2020-11-04 VITALS — BP 125/83 | HR 64 | Temp 98.8°F | Ht 64.0 in | Wt 168.4 lb

## 2020-11-04 DIAGNOSIS — I609 Nontraumatic subarachnoid hemorrhage, unspecified: Secondary | ICD-10-CM

## 2020-11-04 DIAGNOSIS — R269 Unspecified abnormalities of gait and mobility: Secondary | ICD-10-CM

## 2020-11-04 DIAGNOSIS — R41844 Frontal lobe and executive function deficit: Secondary | ICD-10-CM

## 2020-11-04 DIAGNOSIS — G441 Vascular headache, not elsewhere classified: Secondary | ICD-10-CM | POA: Diagnosis not present

## 2020-11-04 MED ORDER — LEVETIRACETAM 500 MG PO TABS: 500.0000 mg | ORAL_TABLET | Freq: Two times a day (BID) | ORAL | 2 refills | Status: DC

## 2020-11-04 NOTE — Patient Instructions (Addendum)
KEEP WORKING ON STANDING/TRANSFERRING/MOVING YOUR LEGS EVERY DAY!   KEEP A LOG AND CHART YOUR PROGRESS. TRY TO IMPROVE A LITTLE BIT EACH DAY.    I WANT TO TRANSITION YOU TO OUTPATIENT THERAPY

## 2020-11-04 NOTE — Progress Notes (Signed)
Subjective:    Patient ID: Amy Barry, female    DOB: 11-08-1970, 50 y.o.   MRN: [id number]  HPI  Amy Barry is here in follow up of her [place]. I last saw her in May. She reports some improvement in her nausea which is pretty mild at this point. She may have some GERD, but she's not sure. Her swallowing is improving too.   Her headaches are slowly improving. She is on topamax and maxalt for rescue. They are more dull at this point. The pain is mostly in her temporal area. It's worst when she sneezes or coughs. She had her vision checked recently by ophthalmology. Her vision hasn't improved a great deal and she was told that there "wasn't much which could be done".   She is still having generalized pain related to her CT disorder. It can often serve as detriment to her acitivity levels. HH is coming to the house, but I was hoping to have transitioned to outpt by now.   She missed a couple keppra doses and found that her pain was much worse. We had kept this medication going because of question of seizures however.      Pain Inventory Average Pain 7 Pain Right Now 7 My pain is constant, aching, and throb , tinging, numbness  LOCATION OF PAIN  Head, left hip, buttocks, right hand, both feet, legs  BOWEL Number of stools per week: 2 Oral laxative use Yes  Type of laxative Miralax Enema or suppository use No  History of colostomy No  Incontinent No   BLADDER Normal In and out cath, frequency n/a Able to self cath No  Bladder incontinence Yes  Frequent urination Yes  Leakage with coughing No  Difficulty starting stream No  Incomplete bladder emptying No    Mobility walk with assistance use a [place] how many minutes can you walk? Unknown  ability to climb steps?  yes do you drive?  no use a wheelchair needs help with transfers Do you have any goals in this area?  yes  Function employed # of hrs/week Medical leave from [employer] I need assistance with the following:  meal  prep, household duties, and shopping Do you have any goals in this area?  yes  Neuro/Psych bowel control problems weakness numbness tremor tingling trouble walking spasms dizziness anxiety loss of taste or smell  Prior Studies Any changes since last visit?  no  Physicians involved in your care Any changes since last visit?  no   Family History  Problem Relation Age of Onset   Arthritis Other    Hyperlipidemia Other    Hypertension Daughter    Colon polyps Daughter 83       hermatoma oversized polyps-benign   Stroke Other    Leukemia Maternal Grandmother    Heart disease Maternal Grandmother    Asthma Sister    Hypertension Mother        hx aneursym and surgery   Atrial fibrillation Mother    Sarcoidosis Mother    Healthy Father    Colon cancer Neg Hx    Social History   Socioeconomic History   Marital status: Married    Spouse name: Not on file   Number of children: 2   Years of education: College   Highest education level: Not on file  Occupational History   Occupation: Therapist, sports    Employer: [employer] manager  Tobacco Use   Smoking status: Never   Smokeless tobacco: Never  Vaping Use  Vaping Use: Never used  Substance and Sexual Activity   Alcohol use: No    Alcohol/week: 0.0 standard drinks   Drug use: Never   Sexual activity: Yes    Partners: Male    Birth control/protection: Surgical  Other Topics Concern   Not on file  Social History Narrative   Lives at home with husband.   Right-handed.   Occasional caffeine use.   Social Determinants of Health   Financial Resource Strain: Not on file  Food Insecurity: Not on file  Transportation Needs: Not on file  Physical Activity: Not on file  Stress: Not on file  Social Connections: Not on file   Past Surgical History:  Procedure Laterality Date   CERVICAL DISC ARTHROPLASTY N/A 10/23/2015   Procedure: Cervical Disc Arthroplasty Cervical six-seven;  Surgeon: Eustace Moore, MD;  Location: [facility];  Service: Neurosurgery;  Laterality: N/A;   D & C HYSTEROSCOPY W/ RESECTION ENDOMETRIAL POLYP  10-13-2003    dr rivard '@[facility]    ESOPHAGOGASTRODUODENOSCOPY     for GERD   IR ANGIO INTRA EXTRACRAN SEL COM CAROTID INNOMINATE BILAT MOD SED  07/06/2020   IR ANGIO INTRA EXTRACRAN SEL INTERNAL CAROTID BILAT MOD SED  06/29/2020   IR ANGIO VERTEBRAL SEL VERTEBRAL BILAT MOD SED  06/29/2020   IR ANGIO VERTEBRAL SEL VERTEBRAL BILAT MOD SED  07/06/2020   LAPAROSCOPIC ABDOMINAL EXPLORATION  1996   dx ibs   OVARIAN CYST SURGERY  age 32   x2 cyst   RADIOLOGY WITH ANESTHESIA N/A 06/29/2020   Procedure: IR WITH ANESTHESIA;  Surgeon: Radiologist, Medication, MD;  Location: [place];  Service: Radiology;  Laterality: N/A;   ROBOTIC ASSISTED TOTAL HYSTERECTOMY N/A 12/13/2017   Procedure: XI ROBOTIC ASSISTED TOTAL HYSTERECTOMYWITH BILATERAL SALPINGECTOMY;  Surgeon: Christophe Louis, MD;  Location: [facility];  Service: Gynecology;  Laterality: N/A;   TUBAL LIGATION Bilateral 06-01-2002   dr Mancel Bale '@[facility]'$    PPTL   Past Medical History:  Diagnosis Date   Arthralgia    Generalized weakness    bilateral upper and lower extremities,  walks with cane   GERD (gastroesophageal reflux disease)    Headache(784.0)    Heart murmur    per pt "since childhood, no problems"   History of kidney stones    Hypertension    followed pcp   IBS (irritable bowel syndrome)    Iron deficiency anemia    12-06-2017 per pt recently had IV iron infusion July 2019   Low vitamin D level    Osteoarthritis    PVC (premature ventricular contraction)    per pt had holter monitor   SLE (systemic lupus erythematosus) ([place])    rheumotologist-- dr syed   Wears glasses    BP 125/83   Pulse 64   Temp 98.8 F (37.1 C)   Ht '5\' 4"'$  (1.626 m)   LMP 12/22/2017   SpO2 98%   BMI 30.21 kg/m   Opioid Risk Score:   Fall Risk Score:  `1  Depression screen PHQ 2/9  Depression screen [facility] 2/9 08/05/2020 03/04/2019  Decreased Interest 0 0  Down, Depressed,  Hopeless 0 0  PHQ - 2 Score 0 0  Some recent data might be hidden    Review of Systems  Gastrointestinal:  Positive for constipation.       Hemorrhoids  Genitourinary:  Positive for urgency.  Musculoskeletal:  Positive for gait problem.  Neurological:  Positive for dizziness, tremors, weakness, numbness and headaches.  Psychiatric/Behavioral:  Anxiety      Objective:   Physical Exam  General: No acute distress HEENT: NCAT, EOMI, oral membranes moist Cards: reg rate  Chest: normal effort Abdomen: Soft, NT, ND Skin: dry, intact Extremities: no edema Psych: pleasant and appropriate, MORE UP BEAT TODAY Neuro: Alert and oriented x 3. Normal insight and awareness. STM deficits and some difficulties with concentration and focus.. Normal language and speech. Cranial nerve exam unremarkable. UE 3=4/5. LE: 2+ prox to 4/5 distally.  Left is weaker than right. Inconsistent sensory exam in both lower extremities. Musculoskeletal: bilateral hip, thigh pain with palpation as well as active range of motion.            Assessment & Plan:  1.  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             -continue with HH therapies PT and OT             -family is supportive.             -still would like to transition to outpt therapies, aquatic therapy at some point             -Really needs to strengthen hip and shoulder girdle musculature.  2.  Pain management             -Oxycodone as needed             -continue  topical modalities for myalgias, Robaxin  as needed             -Topamax '100mg'$  bid for headaches              3.  Seizure prophylaxis: keppra '500mg'$  bid---change to pill form  -continue for now as she find it helps her pain. 4.  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 Migraine/vascular headaches---seem to be improving, decrease nausea             -topamax  '100mg'$  bid             -Maxalt 5 mg 1 to 2 tablets daily as needed for migraine headaches    -these help   -vision component  -relaxation/rest when needed   15 minutes of face to face patient care time were spent during this visit. All questions were encouraged and answered.  Follow up with me in 3 mos .

## 2020-11-05 ENCOUNTER — Telehealth: Payer: Self-pay | Admitting: Physical Medicine & Rehabilitation

## 2020-11-05 NOTE — Telephone Encounter (Signed)
I have left patient a voicemail about picking up FMLA paperwork and there is a $25 charge.  I have sent a copy to scan center and I have also put a copy in our book up front.  Original copy is up front and ready for pick up for patient in accordion file.

## 2020-11-12 ENCOUNTER — Other Ambulatory Visit: Payer: Self-pay | Admitting: Physical Medicine & Rehabilitation

## 2020-11-22 ENCOUNTER — Other Ambulatory Visit: Payer: Self-pay | Admitting: Physical Medicine & Rehabilitation

## 2020-11-25 ENCOUNTER — Ambulatory Visit (HOSPITAL_COMMUNITY)
Admission: RE | Admit: 2020-11-25 | Discharge: 2020-11-25 | Disposition: A | Discharge status: Home / Self Care | Payer: No Typology Code available for payment source | Source: Ambulatory Visit | Attending: Physician Assistant | Admitting: Physician Assistant

## 2020-11-25 DIAGNOSIS — I609 Nontraumatic subarachnoid hemorrhage, unspecified: Secondary | ICD-10-CM | POA: Insufficient documentation

## 2020-12-09 ENCOUNTER — Other Ambulatory Visit: Payer: Self-pay | Admitting: Physical Medicine & Rehabilitation

## 2020-12-16 ENCOUNTER — Ambulatory Visit: Payer: No Typology Code available for payment source | Admitting: Physical Medicine & Rehabilitation

## 2020-12-29 ENCOUNTER — Other Ambulatory Visit: Payer: Self-pay | Admitting: Physical Medicine & Rehabilitation

## 2021-01-04 ENCOUNTER — Encounter: Payer: Self-pay | Admitting: Internal Medicine

## 2021-01-19 ENCOUNTER — Other Ambulatory Visit: Payer: Self-pay

## 2021-01-19 ENCOUNTER — Ambulatory Visit: Payer: No Typology Code available for payment source

## 2021-01-19 ENCOUNTER — Telehealth: Payer: Self-pay | Admitting: *Deleted

## 2021-01-19 NOTE — Telephone Encounter (Signed)
While doing pt's PV, she states she would like to discuss having an upper EGD d/t trouble swallowing.  She saw Dr. Henrene Pastor for this in 2018, but states she has been having issues especially since March 2022, after having a CVA.  OV made for 02-05-21 at 9:30 with [facility].  Colonoscopy not cancelled at this time, but note put into appt desk to cancel if needed

## 2021-02-03 ENCOUNTER — Other Ambulatory Visit: Payer: Self-pay | Admitting: Physical Medicine & Rehabilitation

## 2021-02-03 DIAGNOSIS — I609 Nontraumatic subarachnoid hemorrhage, unspecified: Secondary | ICD-10-CM

## 2021-02-05 ENCOUNTER — Ambulatory Visit (INDEPENDENT_AMBULATORY_CARE_PROVIDER_SITE_OTHER): Payer: No Typology Code available for payment source | Admitting: Physician Assistant

## 2021-02-05 ENCOUNTER — Encounter: Payer: Self-pay | Admitting: Physician Assistant

## 2021-02-05 VITALS — BP 122/72 | HR 64 | Ht 61.5 in | Wt 168.2 lb

## 2021-02-05 DIAGNOSIS — R1310 Dysphagia, unspecified: Secondary | ICD-10-CM | POA: Diagnosis not present

## 2021-02-05 DIAGNOSIS — K5900 Constipation, unspecified: Secondary | ICD-10-CM

## 2021-02-05 DIAGNOSIS — I609 Nontraumatic subarachnoid hemorrhage, unspecified: Secondary | ICD-10-CM | POA: Diagnosis not present

## 2021-02-05 NOTE — Patient Instructions (Signed)
Continue Omeprazole.  Start Miralax 1 capful daily in 8 ounces of liquid.  You have been scheduled for a Barium Esophogram at [facility] (1st floor of the hospital) on Friday 02/12/21 at 9 am. Please arrive 15 minutes prior to your appointment for registration. Make certain not to have anything to eat or drink 3 hours prior to your test. If you need to reschedule for any reason, please contact radiology at [phone] to do so. __________________________________________________________________ A barium swallow is an examination that concentrates on views of the esophagus. This tends to be a double contrast exam (barium and two liquids which, when combined, create a gas to distend the wall of the oesophagus) or single contrast (non-ionic iodine based). The study is usually tailored to your symptoms so a good history is essential. Attention is paid during the study to the form, structure and configuration of the esophagus, looking for functional disorders (such as aspiration, dysphagia, achalasia, motility and reflux) EXAMINATION You may be asked to change into a gown, depending on the type of swallow being performed. A radiologist and radiographer will perform the procedure. The radiologist will advise you of the type of contrast selected for your procedure and direct you during the exam. You will be asked to stand, sit or lie in several different positions and to hold a small amount of fluid in your mouth before being asked to swallow while the imaging is performed .In some instances you may be asked to swallow barium coated marshmallows to assess the motility of a solid food bolus. The exam can be recorded as a digital or video fluoroscopy procedure. POST PROCEDURE It will take 1-2 days for the barium to pass through your system. To facilitate this, it is important, unless otherwise directed, to increase your fluids for the next 24-48hrs and to resume your normal diet.  This test typically  takes about 30 minutes to perform. ___________________________________________________________________________

## 2021-02-05 NOTE — Progress Notes (Signed)
Chief Complaint: Dysphagia and constipation  HPI:    Amy Barry is a 50 year old African-American female with a past medical history of anxiety, GERD, IBS, IDA, stroke and others listed below, known to Dr. Henrene Pastor, who was referred to me by Donald Prose, MD for a complaint of dysphagia and constipation.    06/13/2016 patient seen in clinic by Dr. Henrene Pastor for epigastric pain and constipation as well as dysphagia.  At that time schedule patient for an upper EGD and continued PPI.    30/28/18 EGD was normal.    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  She had lasting difficulties including dizziness and headaches.    Today, the patient presents to clinic accompanied by her daughter who assists with history.  She explains that she has always had intermittent issues with swallowing where things feel like they get stuck and she gets choked.  This significantly worsened after her stroke in March of this year.  She was told by her neurologist that likely this was just a side effect from stroke but she wanted to mention it to [name] before she has her scheduled colonoscopy 11/17 patient needs an EGD.  Tells me that anything that she eats or drinks can get stuck in her throat and and often feels like they just do not make it all the way down to her stomach.  Describes some intermittent episodes of nausea and vomiting as well as pills getting stuck.  All this seems to come and go in its intensity and recently has seemed a little bit better.  She has been started on Omeprazole 40 mg daily by her PCP which she thinks is helping for some reflux symptoms she was experiencing.    Constipation since time of stroke, she was using MiraLAX every 2 to 3 days which was seeming to help, but then it stopped working.  She cannot remember when her last bowel movement was.    She has 2 daughters, one with her here today and another at [facility] [employer] to become a  Animal nutritionist.    Denies fever, chills, abdominal pain or symptoms that awaken her from sleep.  Past Medical History:  Diagnosis Date   Anxiety    Arthralgia    Generalized weakness    bilateral upper and lower extremities,  walks with cane   GERD (gastroesophageal reflux disease)    Headache(784.0)    Heart murmur    per pt "since childhood, no problems"   History of kidney stones    Hypertension    followed pcp   IBS (irritable bowel syndrome)    Iron deficiency anemia    12-06-2017 per pt recently had IV iron infusion July 2019   Low vitamin D level    Osteoarthritis    pt unsure about this   PVC (premature ventricular contraction)    per pt had holter monitor   SLE (systemic lupus erythematosus) ([place])    rheumotologist-- dr syed   Stroke [facility]) 06/2020   Wears glasses     Past Surgical History:  Procedure Laterality Date   CERVICAL DISC ARTHROPLASTY N/A 10/23/2015   Procedure: Cervical Disc Arthroplasty Cervical six-seven;  Surgeon: Eustace Moore, MD;  Location: [place] [facility];  Service: Neurosurgery;  Laterality: N/A;   D & C HYSTEROSCOPY W/ RESECTION ENDOMETRIAL POLYP  10-13-2003    dr rivard @[facility]    ESOPHAGOGASTRODUODENOSCOPY     for GERD   IR ANGIO INTRA EXTRACRAN SEL COM CAROTID INNOMINATE  BILAT MOD SED  07/06/2020   IR ANGIO INTRA EXTRACRAN SEL INTERNAL CAROTID BILAT MOD SED  06/29/2020   IR ANGIO VERTEBRAL SEL VERTEBRAL BILAT MOD SED  06/29/2020   IR ANGIO VERTEBRAL SEL VERTEBRAL BILAT MOD SED  07/06/2020   LAPAROSCOPIC ABDOMINAL EXPLORATION  1996   dx ibs   OVARIAN CYST SURGERY  age 50   x2 cyst   RADIOLOGY WITH ANESTHESIA N/A 06/29/2020   Procedure: IR WITH ANESTHESIA;  Surgeon: Radiologist, Medication, MD;  Location: [place];  Service: Radiology;  Laterality: N/A;   ROBOTIC ASSISTED TOTAL HYSTERECTOMY N/A 12/13/2017   Procedure: XI ROBOTIC ASSISTED TOTAL HYSTERECTOMYWITH BILATERAL SALPINGECTOMY;  Surgeon: Christophe Louis, MD;  Location: [facility];  Service: Gynecology;   Laterality: N/A;   TUBAL LIGATION Bilateral 06-01-2002   dr Mancel Bale @[facility]    PPTL    Current Outpatient Medications  Medication Sig Dispense Refill   acetaminophen-codeine (TYLENOL #3) 300-30 MG tablet TAKE 1 TO 2 TABLETS BY MOUTH EVERY 4 HOURS AS NEEDED FOR MODERATE PAIN 50 tablet 0   amLODipine (NORVASC) 5 MG tablet Take 5 mg by mouth daily.     diclofenac Sodium (VOLTAREN) 1 % GEL Apply 2 g topically 4 (four) times daily. (Patient not taking: Reported on 69/48/5462) 703 g 0   folic acid (FOLVITE) 1 MG tablet Take 1 mg by mouth daily.     furosemide (LASIX) 40 MG tablet Take 40 mg by mouth daily.     KLOR-CON M20 20 MEQ tablet TAKE 1 TABLET BY MOUTH EVERY DAY 30 tablet 0   levETIRAcetam (KEPPRA) 500 MG tablet TAKE 1 TABLET BY MOUTH TWICE A DAY 180 tablet 0   lidocaine (LIDODERM) 5 % Place 2 patches onto the skin daily. Apply at 8pm and remove at 8 pm--to shoulder/neck. (Purchase this over the counter as insurance usually does not cover this) (Patient not taking: No sig reported) 60 patch 0   Melatonin 10 MG CAPS Take by mouth daily.     Menthol-Methyl Salicylate (MUSCLE RUB) 10-15 % CREA Apply 1 application topically 2 (two) times daily as needed for muscle pain. (Patient not taking: No sig reported)  0   methocarbamol (ROBAXIN) 500 MG tablet TAKE 1 TABLET BY MOUTH FOUR TIMES A DAY AS NEEDED (Patient taking differently: Take 1 tablet by mouth four times a day) 120 tablet 1   metoprolol (LOPRESSOR) 100 MG tablet TAKE ONE TABLET BY MOUTH TWICE DAILY (Patient taking differently: Take 100 mg by mouth 2 (two) times daily.) 180 tablet 3   Multiple Vitamin (MULTIVITAMIN WITH MINERALS) TABS Take 1 tablet by mouth daily.  (Patient not taking: Reported on 01/19/2021)     omeprazole (PRILOSEC) 40 MG capsule daily.     ondansetron (ZOFRAN-ODT) 4 MG disintegrating tablet PRN     oxyCODONE (OXY IR/ROXICODONE) 5 MG immediate release tablet Take 1 tablet (5 mg total) by mouth every 6 (six) hours as needed for  severe pain. (Patient not taking: Reported on 01/19/2021) 20 tablet 0   pantoprazole (PROTONIX) 40 MG tablet Take 40 mg by mouth daily as needed (for acid reflux/indigestion).  (Patient not taking: Reported on 01/19/2021)     polycarbophil (FIBERCON) 625 MG tablet Take 1 tablet (625 mg total) by mouth daily. (Patient not taking: Reported on 01/19/2021)     polyethylene glycol powder (GLYCOLAX/MIRALAX) 17 GM/SCOOP powder Takes PRN     potassium chloride (KLOR-CON) 20 MEQ packet Take by mouth 2 (two) times daily.     rizatriptan (MAXALT) 5 MG  tablet TAKE 1 TABLET (5 MG TOTAL) BY MOUTH ONCE AS NEEDED FOR MIGRAINE. MAY REPEAT IN 2 HOURS IF NEEDED 30 tablet 2   saccharomyces boulardii (FLORASTOR) 250 MG capsule Take 1 capsule (250 mg total) by mouth 2 (two) times daily. (Patient taking differently: Take 250 mg by mouth 2 (two) times daily. Takes daily only)     topiramate (TOPAMAX) 100 MG tablet TAKE 1 TABLET BY MOUTH TWICE A DAY 180 tablet 1   traZODone (DESYREL) 50 MG tablet TAKE 1 TABLET BY MOUTH EVERYDAY AT BEDTIME 90 tablet 1   VERAPAMIL HCL PO  (Patient not taking: No sig reported)     No current facility-administered medications for this visit.    Allergies as of 02/05/2021 - Review Complete 01/19/2021  Allergen Reaction Noted   Sumatriptan Other (See Comments) 04/25/2006   Meperidine Nausea And Vomiting 04/25/2006   Metoclopramide Other (See Comments) 04/25/2006   Oxycodone Other (See Comments) 12/06/2017   Oxycodone-acetaminophen  04/25/2006   Tramadol Nausea Only 10/22/2015   Codeine Palpitations 04/25/2006   Propoxyphene n-acetaminophen Nausea And Vomiting 10/02/2007    Family History  Problem Relation Age of Onset   Arthritis Other    Hyperlipidemia Other    Hypertension Daughter    Colon polyps Daughter 43       hermatoma oversized polyps-benign   Stroke Other    Leukemia Maternal Grandmother    Heart disease Maternal Grandmother    Asthma Sister    Hypertension Mother         hx aneursym and surgery   Atrial fibrillation Mother    Sarcoidosis Mother    Healthy Father    Colon cancer Neg Hx     Social History   Socioeconomic History   Marital status: Married    Spouse name: Not on file   Number of children: 2   Years of education: College   Highest education level: Not on file  Occupational History   Occupation: Programmer, multimedia: [employer]  Tobacco Use   Smoking status: Never   Smokeless tobacco: Never  Vaping Use   Vaping Use: Never used  Substance and Sexual Activity   Alcohol use: No    Alcohol/week: 0.0 standard drinks   Drug use: Never   Sexual activity: Yes    Partners: Male    Birth control/protection: Surgical  Other Topics Concern   Not on file  Social History Narrative   Lives at home with husband.   Right-handed.   Occasional caffeine use.   Social Determinants of Health   Financial Resource Strain: Not on file  Food Insecurity: Not on file  Transportation Needs: Not on file  Physical Activity: Not on file  Stress: Not on file  Social Connections: Not on file  Intimate Partner Violence: Not on file    Review of Systems:    Constitutional: No fever or chills Skin: No rash  Cardiovascular: No chest pain Respiratory: No SOB  Gastrointestinal: See HPI and otherwise negative Genitourinary: No dysuria Neurological: No headache, dizziness or syncope Musculoskeletal: No new muscle or joint pain Hematologic: No bleeding  Psychiatric: No history of depression or anxiety   Physical Exam:  Vital signs: BP 122/72 (BP Location: Right Arm, Patient Position: Sitting, Cuff Size: Normal)   Pulse 64   Ht 5' 1.5" (1.562 m) Comment: height measured without shoes  Wt 168 lb 4 oz (76.3 kg)   LMP 12/22/2017   BMI 31.28 kg/m    Constitutional:  Pleasant AA female appears to be in NAD, Well developed, Well nourished, alert and cooperative Head:  Normocephalic and atraumatic. Eyes:   PEERL, EOMI. No icterus. Conjunctiva  pink. Ears:  Normal auditory acuity. Neck:  Supple Throat: Oral cavity and pharynx without inflammation, swelling or lesion.  Respiratory: Respirations even and unlabored. Lungs clear to auscultation bilaterally.   No wheezes, crackles, or rhonchi.  Cardiovascular: Normal S1, S2. No MRG. Regular rate and rhythm. No peripheral edema, cyanosis or pallor.  Gastrointestinal:  Soft, nondistended, nontender. No rebound or guarding. Normal bowel sounds. No appreciable masses or hepatomegaly. Rectal:  Not performed.  Msk:  Symmetrical without gross deformities. Without edema, no deformity or joint abnormality.  Neurologic:  Alert and  oriented x4;  + residual weakness, uses [place] to ambulate Skin:   Dry and intact without significant lesions or rashes. Psychiatric: Demonstrates good judgement and reason without abnormal affect or behaviors.  Minimal decreased comprehension  RELEVANT LABS AND IMAGING: CBC    Component Value Date/Time   WBC 4.6 07/20/2020 0534   RBC 3.56 (L) 07/20/2020 0534   HGB 11.3 (L) 07/20/2020 0534   HCT 34.6 (L) 07/20/2020 0534   PLT 352 07/20/2020 0534   MCV 97.2 07/20/2020 0534   MCH 31.7 07/20/2020 0534   MCHC 32.7 07/20/2020 0534   RDW 12.0 07/20/2020 0534   LYMPHSABS 1.3 07/10/2020 0531   MONOABS 1.2 (H) 07/10/2020 0531   EOSABS 0.0 07/10/2020 0531   BASOSABS 0.0 07/10/2020 0531    CMP     Component Value Date/Time   NA 133 (L) 07/20/2020 0534   K 3.8 07/20/2020 0534   CL 108 07/20/2020 0534   CO2 17 (L) 07/20/2020 0534   GLUCOSE 86 07/20/2020 0534   BUN 5 (L) 07/20/2020 0534   CREATININE 0.67 07/20/2020 0534   CALCIUM 9.5 07/20/2020 0534   PROT 6.7 07/10/2020 1010   ALBUMIN 3.1 (L) 07/10/2020 1010   AST 21 07/10/2020 1010   ALT 23 07/10/2020 1010   ALKPHOS 43 07/10/2020 1010   BILITOT 0.7 07/10/2020 1010   GFRNONAA >60 07/20/2020 0534   GFRAA >60 12/06/2017 1644    Assessment: 1.  Screening for colorectal cancer: Patient has never had a  colonoscopy and is scheduled for one in November with Dr. Henrene Pastor 2.  Dysphagia: Previous endoscopy in 2018 was normal for intermittent symptoms, now worse than stroke; likely dysmotility versus stricture 3.  Constipation: Increased since stroke; likely decreased motility/slow transit 4.  Stroke: In March not on anticoagulation  Plan: 1.  Patient already scheduled for colonoscopy on November 17.  I have ordered a barium esophagram with tablet to be completed prior to then.  If this shows any need for EGD then we may need to cancel the colonoscopy and reschedule her for a double procedure.  Discussed this today. 2.  Reviewed anti-dysphagia measures. 3.  Continue Omeprazole 40 mg daily 4.  Discussed patient should add back in MiraLAX once daily for constipation 5.  Patient to follow in clinic per recommendations after time of esophagram  Ellouise Newer, PA-C [facility] 02/05/2021, 9:36 AM  Cc: Donald Prose, MD

## 2021-02-05 NOTE — Progress Notes (Signed)
Assessment and plan reviewed. Keep colonoscopy as planned. If the esophagram suggest that she needs an EGD, this can be on a separate day.  The procedures do not need to be on the same day. Thanks Dr. Henrene Pastor

## 2021-02-10 ENCOUNTER — Encounter: Payer: Self-pay | Admitting: Physical Medicine & Rehabilitation

## 2021-02-10 ENCOUNTER — Encounter
Payer: No Typology Code available for payment source | Attending: Physical Medicine & Rehabilitation | Admitting: Physical Medicine & Rehabilitation

## 2021-02-10 ENCOUNTER — Other Ambulatory Visit: Payer: Self-pay

## 2021-02-10 VITALS — BP 130/80 | HR 67 | Temp 99.0°F | Ht 61.5 in | Wt 168.0 lb

## 2021-02-10 DIAGNOSIS — R4189 Other symptoms and signs involving cognitive functions and awareness: Secondary | ICD-10-CM | POA: Diagnosis present

## 2021-02-10 DIAGNOSIS — I609 Nontraumatic subarachnoid hemorrhage, unspecified: Secondary | ICD-10-CM | POA: Insufficient documentation

## 2021-02-10 DIAGNOSIS — M7522 Bicipital tendinitis, left shoulder: Secondary | ICD-10-CM | POA: Diagnosis present

## 2021-02-10 MED ORDER — METHYLPREDNISOLONE 4 MG PO TBPK: ORAL_TABLET | ORAL | 0 refills | Status: DC

## 2021-02-10 NOTE — Progress Notes (Signed)
Subjective:    Patient ID: Amy Barry, female    DOB: [birth year], 50 y.o.   MRN: [id number]  HPI  [name] is here in follow up of her [place]. She completed HH therapies. She transitioned to HEP. She hurt her right shoulder doing exercises. She's struggling to move her left arm now. She has not gone to outpt therapy.   From a standpoint of her headaches they have been "coming and going". She notices brain "fog" when she hears loud noises or when she's in crowds. 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 She finds it difficult to concentrate in general.   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    Pain Inventory Average Pain 10 Pain Right Now 8 My pain is constant, sharp, aching, and numbness, shoots  LOCATION OF PAIN  left-side upper shoulder arm wrist elbow chest pain  BOWEL Number of stools per week: 1-2 Oral laxative use  Miralax, Probiotic  BLADDER Normal    Mobility walk with assistance use a [place] how many minutes can you walk? 30 mins ability to climb steps?  yes do you drive?  no use a wheelchair transfers alone Do you have any goals in this area?  yes  Function disabled: date disabled applied I need assistance with the following:  shopping Do you have any goals in this area?  yes  Neuro/Psych bowel control problems weakness numbness tingling spasms confusion anxiety  Prior Studies Any changes since last visit?  no  Physicians involved in your care Any changes since last visit?  yes, GI ,   Family History  Problem Relation Age of Onset   Hypertension Mother        hx aneursym and surgery   Atrial fibrillation Mother    Sarcoidosis Mother    Anuerysm Mother        brain   Arthritis Mother    Healthy Father    Asthma Sister    Leukemia Maternal Grandmother    Heart disease Maternal Grandmother    Stroke Paternal Grandmother    Hypertension Daughter    Colon polyps Daughter 50       hermatoma oversized polyps-benign   Hypertension Daughter    Hyperlipidemia  Other    Colon cancer Neg Hx    Social History   Socioeconomic History   Marital status: Married    Spouse name: Not on file   Number of children: 2   Years of education: College   Highest education level: Not on file  Occupational History   Occupation: Programmer, multimedia: Theme park manager  Tobacco Use   Smoking status: Never   Smokeless tobacco: Never  Vaping Use   Vaping Use: Never used  Substance and Sexual Activity   Alcohol use: No    Alcohol/week: 0.0 standard drinks   Drug use: Never   Sexual activity: Yes    Partners: Male    Birth control/protection: Surgical  Other Topics Concern   Not on file  Social History Narrative   Lives at home with husband.   Right-handed.   Occasional caffeine use.   Social Determinants of Health   Financial Resource Strain: Not on file  Food Insecurity: Not on file  Transportation Needs: Not on file  Physical Activity: Not on file  Stress: Not on file  Social Connections: Not on file   Past Surgical History:  Procedure Laterality Date   CERVICAL DISC ARTHROPLASTY N/A 10/23/2015   Procedure: Cervical Disc Arthroplasty Cervical six-seven;  Surgeon: Camelia Eng  Ronnald Ramp, MD;  Location: [place] [facility];  Service: Neurosurgery;  Laterality: N/A;   D & C HYSTEROSCOPY W/ RESECTION ENDOMETRIAL POLYP  10-13-2003    dr rivard @[facility]    ESOPHAGOGASTRODUODENOSCOPY     for GERD   IR ANGIO INTRA EXTRACRAN SEL COM CAROTID INNOMINATE BILAT MOD SED  07/06/2020   IR ANGIO INTRA EXTRACRAN SEL INTERNAL CAROTID BILAT MOD SED  06/29/2020   IR ANGIO VERTEBRAL SEL VERTEBRAL BILAT MOD SED  06/29/2020   IR ANGIO VERTEBRAL SEL VERTEBRAL BILAT MOD SED  07/06/2020   LAPAROSCOPIC ABDOMINAL EXPLORATION  1996   dx ibs   OVARIAN CYST SURGERY  age 70   x2 cyst   RADIOLOGY WITH ANESTHESIA N/A 06/29/2020   Procedure: IR WITH ANESTHESIA;  Surgeon: Radiologist, Medication, MD;  Location: [place];  Service: Radiology;  Laterality: N/A;   ROBOTIC ASSISTED TOTAL HYSTERECTOMY N/A 12/13/2017    Procedure: XI ROBOTIC ASSISTED TOTAL HYSTERECTOMYWITH BILATERAL SALPINGECTOMY;  Surgeon: Christophe Louis, MD;  Location[facility];  Service: Gynecology;  Laterality: N/A;   TUBAL LIGATION Bilateral 06-01-2002   dr Mancel Bale @[facility]    PPTL   Past Medical History:  Diagnosis Date   Anxiety    Arthralgia    Generalized weakness    bilateral upper and lower extremities,  walks with cane   GERD (gastroesophageal reflux disease)    Headache(784.0)    Heart murmur    per pt "since childhood, no problems"   History of kidney stones    Hypertension    followed pcp   IBS (irritable bowel syndrome)    Iron deficiency anemia    12-06-2017 per pt recently had IV iron infusion July 2019   Low vitamin D level    Osteoarthritis    pt unsure about this   PVC (premature ventricular contraction)    per pt had holter monitor   SLE (systemic lupus erythematosus) ([place])    rheumotologist-- dr syed   Stroke [facility]) 06/2020   Wears glasses    BP 130/80   Pulse 67   Temp 99 F (37.2 C)   Ht 5' 1.5" (1.562 m)   Wt 168 lb (76.2 kg)   LMP 12/22/2017   SpO2 97%   BMI 31.23 kg/m   Opioid Risk Score:   Fall Risk Score:  `1  Depression screen PHQ 2/9  Depression screen [facility] 2/9 11/04/2020 08/05/2020 03/04/2019  Decreased Interest 0 0 0  Down, Depressed, Hopeless 0 0 0  PHQ - 2 Score 0 0 0  Some recent data might be hidden    Review of Systems  Gastrointestinal:  Positive for constipation.  Musculoskeletal:  Positive for gait problem.  Neurological:  Positive for dizziness, weakness, numbness and headaches.  Psychiatric/Behavioral:  Positive for confusion.       Objective:   Physical Exam Constitutional: No distress . Vital signs reviewed. HEENT: NCAT, EOMI, oral membranes moist Neck: supple Cardiovascular: RRR without murmur. No JVD    Respiratory/Chest: CTA Bilaterally without wheezes or rales. Normal effort    GI/Abdomen: BS +, non-tender, non-distended Ext: no clubbing, cyanosis, or  edema Psych: pleasant and cooperative  Neuro: Alert and oriented x 3. Normal insight and awareness. STM deficits and some difficulties with concentration and focus.. Normal language and speech. Cranial nerve exam unremarkable. UE 3=4/5. LE: 2+ prox to 4/5 distally.  Left ilower remains weaker than right. Inconsistent sensory exam in both lower extremities. Musculoskeletal: left short head bicep tendon pain  Assessment & Plan:  1.  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             - transition to outpt therapies,[facility] PT, OT, SLP  -might benefit from stimulant for concentration             -OT to address left shoulder  2.  Left shoulder pain, bicipital tendonitis.              - ice TID.   -outpt OT  -consider injection             -  topical modalities for myalgias, Robaxin  as needed             -Topamax 100mg  bid for headaches              3.  Seizure prophylaxis: keppra 500mg  bid---  4.  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 Migraine/vascular headaches---have improved in general             -topamax 100mg  bid--stay with this for now             -Maxalt 5 mg 1 to 2 tablets daily as needed for migraine headaches                          -these help             -vision/anxiety component             -relaxation/rest when needed   15 minutes of face to face patient care time were spent during this visit. All questions were encouraged and answered.  Follow up with me in 2 mos .

## 2021-02-10 NOTE — Patient Instructions (Signed)
ICE TO LEFT SHOULDER 2-3 X DAILY 30 MINUTES PER  GENTLE RANGE OF MOTION TO YOUR LEFT SHOULDER

## 2021-02-12 ENCOUNTER — Ambulatory Visit (HOSPITAL_COMMUNITY)
Admission: RE | Admit: 2021-02-12 | Discharge: 2021-02-12 | Disposition: A | Discharge status: Home / Self Care | Payer: No Typology Code available for payment source | Source: Ambulatory Visit | Attending: Physician Assistant | Admitting: Physician Assistant

## 2021-02-12 ENCOUNTER — Other Ambulatory Visit: Payer: Self-pay

## 2021-02-12 DIAGNOSIS — R1310 Dysphagia, unspecified: Secondary | ICD-10-CM | POA: Insufficient documentation

## 2021-02-15 ENCOUNTER — Telehealth: Payer: Self-pay | Admitting: *Deleted

## 2021-02-15 MED ORDER — METHYLPREDNISOLONE 4 MG PO TBPK: ORAL_TABLET | ORAL | 0 refills | Status: DC

## 2021-02-15 NOTE — Addendum Note (Signed)
Addended by: Caro Hight on: 02/15/2021 11:56 AM   Modules accepted: Orders

## 2021-02-15 NOTE — Telephone Encounter (Signed)
Mrs Amedee called and said the prednisone was never sent to the pharmacy.

## 2021-02-15 NOTE — Telephone Encounter (Signed)
Medrol dose pack was sent to [facility] on [address] at her last visit

## 2021-02-17 ENCOUNTER — Telehealth: Payer: Self-pay | Admitting: Physician Assistant

## 2021-02-17 NOTE — Telephone Encounter (Signed)
Inbound call from patient's daughter stating patient never received instructions for procedure that is scheduled for tomorrow.  Please advise.

## 2021-02-17 NOTE — Telephone Encounter (Signed)
Spoke with patient's daughter and informed her instructions have been sent to her mychart. Informed daughter patient needs to start clear liquids today. Patient daughter voiced understanding.

## 2021-02-18 ENCOUNTER — Other Ambulatory Visit: Payer: Self-pay

## 2021-02-18 ENCOUNTER — Encounter: Payer: Self-pay | Admitting: Internal Medicine

## 2021-02-18 ENCOUNTER — Ambulatory Visit (AMBULATORY_SURGERY_CENTER): Payer: No Typology Code available for payment source | Admitting: Internal Medicine

## 2021-02-18 VITALS — BP 140/69 | HR 64 | Temp 97.5°F | Resp 20 | Ht 61.0 in | Wt 164.0 lb

## 2021-02-18 DIAGNOSIS — Z1211 Encounter for screening for malignant neoplasm of colon: Secondary | ICD-10-CM | POA: Diagnosis present

## 2021-02-18 DIAGNOSIS — K5900 Constipation, unspecified: Secondary | ICD-10-CM

## 2021-02-18 MED ORDER — SODIUM CHLORIDE 0.9 % IV SOLN: 500.0000 mL | Freq: Once | INTRAVENOUS | Status: DC

## 2021-02-18 NOTE — Patient Instructions (Signed)
YOU HAD AN ENDOSCOPIC PROCEDURE TODAY AT THE [facility]:   Refer to the procedure report that was given to you for any specific questions about what was found during the examination.  If the procedure report does not answer your questions, please call your gastroenterologist to clarify.  If you requested that your care partner not be given the details of your procedure findings, then the procedure report has been included in a sealed envelope for you to review at your convenience later.  YOU SHOULD EXPECT: Some feelings of bloating in the abdomen. Passage of more gas than usual.  Walking can help get rid of the air that was put into your GI tract during the procedure and reduce the bloating. If you had a lower endoscopy (such as a colonoscopy or flexible sigmoidoscopy) you may notice spotting of blood in your stool or on the toilet paper. If you underwent a bowel prep for your procedure, you may not have a normal bowel movement for a few days.  Please Note:  You might notice some irritation and congestion in your nose or some drainage.  This is from the oxygen used during your procedure.  There is no need for concern and it should clear up in a day or so.  SYMPTOMS TO REPORT IMMEDIATELY:   Following lower endoscopy (colonoscopy or flexible sigmoidoscopy):  Excessive amounts of blood in the stool  Significant tenderness or worsening of abdominal pains  Swelling of the abdomen that is new, acute  Fever of 100F or higher  For urgent or emergent issues, a gastroenterologist can be reached at any hour by calling [phone]. Do not use MyChart messaging for urgent concerns.    DIET:  We do recommend a small meal at first, but then you may proceed to your regular diet.  Drink plenty of fluids but you should avoid alcoholic beverages for 24 hours.  ACTIVITY:  You should plan to take it easy for the rest of today and you should NOT DRIVE or use heavy machinery until tomorrow (because  of the sedation medicines used during the test).    FOLLOW UP: Our staff will call the number listed on your records 48-72 hours following your procedure to check on you and address any questions or concerns that you may have regarding the information given to you following your procedure. If we do not reach you, we will leave a message.  We will attempt to reach you two times.  During this call, we will ask if you have developed any symptoms of COVID 19. If you develop any symptoms (ie: fever, flu-like symptoms, shortness of breath, cough etc.) before then, please call [phone].  If you test positive for Covid 19 in the 2 weeks post procedure, please call and report this information to us.    If any biopsies were taken you will be contacted by phone or by letter within the next 1-3 weeks.  Please call us at [phone] if you have not heard about the biopsies in 3 weeks.    SIGNATURES/CONFIDENTIALITY: You and/or your care partner have signed paperwork which will be entered into your electronic medical record.  These signatures attest to the fact that that the information above on your After Visit Summary has been reviewed and is understood.  Full responsibility of the confidentiality of this discharge information lies with you and/or your care-partner. 

## 2021-02-18 NOTE — Progress Notes (Signed)
PT taken to PACU. Monitors in place. VSS. Report given to RN. 

## 2021-02-18 NOTE — Progress Notes (Signed)
HISTORY OF PRESENT ILLNESS:  Amy Barry is a 50 y.o. female who presents today for screening colonoscopy.  Her first colonoscopy.  No relevant complaints.  She was recently seen by the GI physician assistant February 05, 2021.  See that dictation for complete H&P details  REVIEW OF SYSTEMS:  All non-GI ROS negative.  Past Medical History:  Diagnosis Date   Anxiety    Arthralgia    Generalized weakness    bilateral upper and lower extremities,  walks with cane   GERD (gastroesophageal reflux disease)    Headache(784.0)    Heart murmur    per pt "since childhood, no problems"   History of kidney stones    Hypertension    followed pcp   IBS (irritable bowel syndrome)    Iron deficiency anemia    12-06-2017 per pt recently had IV iron infusion July 2019   Low vitamin D level    Osteoarthritis    pt unsure about this   PVC (premature ventricular contraction)    per pt had holter monitor   SLE (systemic lupus erythematosus) ([place])    rheumotologist-- dr syed   Stroke [facility]) 06/2020   Wears glasses     Past Surgical History:  Procedure Laterality Date   CERVICAL DISC ARTHROPLASTY N/A 10/23/2015   Procedure: Cervical Disc Arthroplasty Cervical six-seven;  Surgeon: Eustace Moore, MD;  Location: [facility];  Service: Neurosurgery;  Laterality: N/A;   D & C HYSTEROSCOPY W/ RESECTION ENDOMETRIAL POLYP  10-13-2003    dr rivard @[facility]    ESOPHAGOGASTRODUODENOSCOPY     for GERD   IR ANGIO INTRA EXTRACRAN SEL COM CAROTID INNOMINATE BILAT MOD SED  07/06/2020   IR ANGIO INTRA EXTRACRAN SEL INTERNAL CAROTID BILAT MOD SED  06/29/2020   IR ANGIO VERTEBRAL SEL VERTEBRAL BILAT MOD SED  06/29/2020   IR ANGIO VERTEBRAL SEL VERTEBRAL BILAT MOD SED  07/06/2020   LAPAROSCOPIC ABDOMINAL EXPLORATION  1996   dx ibs   OVARIAN CYST SURGERY  age 30   x2 cyst   RADIOLOGY WITH ANESTHESIA N/A 06/29/2020   Procedure: IR WITH ANESTHESIA;  Surgeon: Radiologist, Medication, MD;  Location: [facility];  Service: Radiology;   Laterality: N/A;   ROBOTIC ASSISTED TOTAL HYSTERECTOMY N/A 12/13/2017   Procedure: XI ROBOTIC ASSISTED TOTAL HYSTERECTOMYWITH BILATERAL SALPINGECTOMY;  Surgeon: Christophe Louis, MD;  Location: [facility];  Service: Gynecology;  Laterality: N/A;   TUBAL LIGATION Bilateral 06-01-2002   dr Mancel Bale @[facility]    PPTL    Social History Amy Barry  reports that she has never smoked. She has never used smokeless tobacco. She reports that she does not drink alcohol and does not use drugs.  family history includes Anuerysm in her mother; Arthritis in her mother; Asthma in her sister; Atrial fibrillation in her mother; Colon polyps (age of onset: 33) in her daughter; Healthy in her father; Heart disease in her maternal grandmother; Hyperlipidemia in an other family member; Hypertension in her daughter, daughter, and mother; Leukemia in her maternal grandmother; Sarcoidosis in her mother; Stroke in her paternal grandmother.  Allergies  Allergen Reactions   Sumatriptan Other (See Comments)    High blood pressure Other reaction(s): Hypertension, Unknown   Meperidine Nausea And Vomiting    Other reaction(s): Unknown   Metoclopramide Other (See Comments)    "shakiness" Other reaction(s): Unknown   Oxycodone Other (See Comments)    DROWSY   Oxycodone-Acetaminophen     Other reaction(s): Unknown   Tramadol Nausea Only  Codeine Palpitations    Other reaction(s): Nausea/Vomiting, Unknown   Propoxyphene N-Acetaminophen Nausea And Vomiting       PHYSICAL EXAMINATION:  Vital signs: BP (!) 143/78   Pulse 63   Temp (!) 97.5 F (36.4 C) (Skin)   Ht 5\' 1"  (1.549 m)   Wt 164 lb (74.4 kg)   LMP 12/22/2017   SpO2 100%   BMI 30.99 kg/m  General: Well-developed, well-nourished, no acute distress HEENT: Sclerae are anicteric, conjunctiva pink. Oral mucosa intact Lungs: Clear Heart: Regular Abdomen: soft, nontender, nondistended, no obvious ascites, no peritoneal signs, normal bowel sounds. No  organomegaly. Extremities: No edema Psychiatric: alert and oriented x3. Cooperative     ASSESSMENT:  1.  Colon cancer screening.  Average risk   PLAN:   1.  Screening colonoscopy

## 2021-02-18 NOTE — Op Note (Signed)
[place] Patient Name: Amy Barry Procedure Date: 02/18/2021 4:40 PM MRN: [id number] Endoscopist: Docia Chuck. Henrene Pastor , MD Age: 50 Referring MD:  Date of Birth: 1970/09/27 Gender: Female Account #: [id number] Procedure:                Colonoscopy Indications:              Screening for colorectal malignant neoplasm Medicines:                Monitored Anesthesia Care Procedure:                Pre-Anesthesia Assessment:                           - Prior to the procedure, a History and Physical                            was performed, and patient medications and                            allergies were reviewed. The patient's tolerance of                            previous anesthesia was also reviewed. The risks                            and benefits of the procedure and the sedation                            options and risks were discussed with the patient.                            All questions were answered, and informed consent                            was obtained. Prior Anticoagulants: The patient has                            taken no previous anticoagulant or antiplatelet                            agents. ASA Grade Assessment: II - A patient with                            mild systemic disease. After reviewing the risks                            and benefits, the patient was deemed in                            satisfactory condition to undergo the procedure.                           After obtaining informed consent, the colonoscope  was passed under direct vision. Throughout the                            procedure, the patient's blood pressure, pulse, and                            oxygen saturations were monitored continuously. The                            #[id number] [place] was introduced through                            the anus and advanced to the the cecum, identified                            by appendiceal  orifice and ileocecal valve. The                            ileocecal valve, appendiceal orifice, and rectum                            were photographed. The quality of the bowel                            preparation was excellent. The colonoscopy was                            performed without difficulty. The patient tolerated                            the procedure well. The bowel preparation used was                            SUPREP via split dose instruction. Scope In: 4:47:41 PM Scope Out: 5:01:54 PM Scope Withdrawal Time: 0 hours 10 minutes 16 seconds  Total Procedure Duration: 0 hours 14 minutes 13 seconds  Findings:                 The entire examined colon appeared normal on direct                            and retroflexion views. Mild melanosis coli                            incidentally noted Complications:            No immediate complications. Estimated blood loss:                            None. Estimated Blood Loss:     Estimated blood loss: none. Impression:               - The entire examined colon is normal on direct and  retroflexion views.                           - No specimens collected. Recommendation:           - Repeat colonoscopy in 10 years for screening                            purposes.                           - Patient has a contact number available for                            emergencies. The signs and symptoms of potential                            delayed complications were discussed with the                            patient. Return to normal activities tomorrow.                            Written discharge instructions were provided to the                            patient.                           - Resume previous diet.                           - Continue present medications. Docia Chuck. Henrene Pastor, MD 02/18/2021 5:07:33 PM This report has been signed electronically.

## 2021-02-22 ENCOUNTER — Telehealth: Payer: Self-pay | Admitting: *Deleted

## 2021-02-22 NOTE — Telephone Encounter (Signed)
  Follow up Call-  Call back number 02/18/2021  Post procedure Call Back phone  # [phone]  Permission to leave phone message Yes  Some recent data might be hidden     Patient questions:  Do you have a fever, pain , or abdominal swelling? No. Pain Score  0 *  Have you tolerated food without any problems? Yes.    Have you been able to return to your normal activities? Yes.    Do you have any questions about your discharge instructions: Diet   No. Medications  No. Follow up visit  No.  Do you have questions or concerns about your Care? No.  Actions: * If pain score is 4 or above: No action needed, pain <4.  Have you developed a fever since your procedure? no  2.   Have you had an respiratory symptoms (SOB or cough) since your procedure? no  3.   Have you tested positive for COVID 19 since your procedure no  4.   Have you had any family members/close contacts diagnosed with the COVID 19 since your procedure?  no   If yes to any of these questions please route to Joylene John, RN and Joella Prince, RN

## 2021-03-03 ENCOUNTER — Ambulatory Visit: Payer: No Typology Code available for payment source | Attending: Family Medicine | Admitting: Occupational Therapy

## 2021-03-03 ENCOUNTER — Encounter: Payer: Self-pay | Admitting: Occupational Therapy

## 2021-03-03 ENCOUNTER — Ambulatory Visit: Payer: No Typology Code available for payment source | Admitting: Speech Pathology

## 2021-03-03 ENCOUNTER — Ambulatory Visit: Payer: No Typology Code available for payment source

## 2021-03-03 ENCOUNTER — Encounter: Payer: Self-pay | Admitting: Speech Pathology

## 2021-03-03 ENCOUNTER — Other Ambulatory Visit: Payer: Self-pay

## 2021-03-03 DIAGNOSIS — R41840 Attention and concentration deficit: Secondary | ICD-10-CM | POA: Insufficient documentation

## 2021-03-03 DIAGNOSIS — M25512 Pain in left shoulder: Secondary | ICD-10-CM | POA: Diagnosis present

## 2021-03-03 DIAGNOSIS — G4486 Cervicogenic headache: Secondary | ICD-10-CM

## 2021-03-03 DIAGNOSIS — M25612 Stiffness of left shoulder, not elsewhere classified: Secondary | ICD-10-CM | POA: Diagnosis present

## 2021-03-03 DIAGNOSIS — R2689 Other abnormalities of gait and mobility: Secondary | ICD-10-CM

## 2021-03-03 DIAGNOSIS — R41844 Frontal lobe and executive function deficit: Secondary | ICD-10-CM | POA: Diagnosis present

## 2021-03-03 DIAGNOSIS — M6281 Muscle weakness (generalized): Secondary | ICD-10-CM | POA: Insufficient documentation

## 2021-03-03 DIAGNOSIS — R279 Unspecified lack of coordination: Secondary | ICD-10-CM | POA: Insufficient documentation

## 2021-03-03 DIAGNOSIS — M542 Cervicalgia: Secondary | ICD-10-CM | POA: Diagnosis present

## 2021-03-03 DIAGNOSIS — R41841 Cognitive communication deficit: Secondary | ICD-10-CM

## 2021-03-03 DIAGNOSIS — I69054 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: Secondary | ICD-10-CM | POA: Insufficient documentation

## 2021-03-03 DIAGNOSIS — R41842 Visuospatial deficit: Secondary | ICD-10-CM | POA: Diagnosis present

## 2021-03-03 NOTE — Therapy (Signed)
[facility] [address] [place], [place], [address] Phone: [phone]   Fax:  [phone]  Physical Therapy Evaluation  Patient Details  Name: Amy Barry MRN: [id number] Date of Birth: [birth year] Referring Provider (PT): Dr. Naaman Plummer   Encounter Date: 03/03/2021   PT End of Session - 03/03/21 1045     Visit Number 1    Number of Visits 17    Date for PT Re-Evaluation 04/28/21    Authorization Type [employer]    PT Start Time 0935    PT Stop Time 1020    PT Time Calculation (min) 45 min    Equipment Utilized During Treatment Gait belt    Activity Tolerance Patient tolerated treatment well    Behavior During Therapy WFL for tasks assessed/performed             Past Medical History:  Diagnosis Date   Anxiety    Arthralgia    Generalized weakness    bilateral upper and lower extremities,  walks with cane   GERD (gastroesophageal reflux disease)    Headache(784.0)    Heart murmur    per pt "since childhood, no problems"   History of kidney stones    Hypertension    followed pcp   IBS (irritable bowel syndrome)    Iron deficiency anemia    12-06-2017 per pt recently had IV iron infusion July 2019   Low vitamin D level    Osteoarthritis    pt unsure about this   PVC (premature ventricular contraction)    per pt had holter monitor   SLE (systemic lupus erythematosus) ([place])    rheumotologist-- dr syed   Stroke [facility]) 06/2020   Wears glasses     Past Surgical History:  Procedure Laterality Date   CERVICAL DISC ARTHROPLASTY N/A 10/23/2015   Procedure: Cervical Disc Arthroplasty Cervical six-seven;  Surgeon: Eustace Moore, MD;  Location: [facility];  Service: Neurosurgery;  Laterality: N/A;   D & C HYSTEROSCOPY W/ RESECTION ENDOMETRIAL POLYP  10-13-2003    dr rivard @[facility]    ESOPHAGOGASTRODUODENOSCOPY     for GERD   IR ANGIO INTRA EXTRACRAN SEL COM CAROTID INNOMINATE BILAT MOD SED  07/06/2020   IR ANGIO  INTRA EXTRACRAN SEL INTERNAL CAROTID BILAT MOD SED  06/29/2020   IR ANGIO VERTEBRAL SEL VERTEBRAL BILAT MOD SED  06/29/2020   IR ANGIO VERTEBRAL SEL VERTEBRAL BILAT MOD SED  07/06/2020   LAPAROSCOPIC ABDOMINAL EXPLORATION  1996   dx ibs   OVARIAN CYST SURGERY  age 50   x2 cyst   RADIOLOGY WITH ANESTHESIA N/A 06/29/2020   Procedure: IR WITH ANESTHESIA;  Surgeon: Radiologist, Medication, MD;  Location: [facility];  Service: Radiology;  Laterality: N/A;   ROBOTIC ASSISTED TOTAL HYSTERECTOMY N/A 12/13/2017   Procedure: XI ROBOTIC ASSISTED TOTAL HYSTERECTOMYWITH BILATERAL SALPINGECTOMY;  Surgeon: Christophe Louis, MD;  Location: [facility];  Service: Gynecology;  Laterality: N/A;   TUBAL LIGATION Bilateral 06-01-2002   dr Mancel Bale @[facility]    PPTL    There were no vitals filed for this visit.    Subjective Assessment - 03/03/21 0942     Subjective She had brain bleed in March of this year and was hospitalized. Prior to stroke and brain bleed, she was experiencing weakness in bil UE and LE for 2 years prior to that. Pt is unsure of what caused the weakness. Pt reported weakness was prominent in R UE and LE prior to stroke. Pt is reporting pain  in L UE. She is still getting headaches everyday. Headache is constant and varies in intensity.  Pt also has pain bil knees. Pt present today with her daughter. Daughter reports that her father is trying to help her by taking her out to movies and grocery shopping but she is afraid that patient is getting overstimulated as she gets worst after long grocery trips.    Patient is accompained by: Family member    Pertinent History medical history of generalized arthritis and myalgias and concern for functional weakness, SLE, irritable bowel, Subarachnoid hemorrhage    Limitations Standing;Walking;Writing;House hold activities;Reading;Lifting    How long can you sit comfortably? no issues    How long can you stand comfortably? 2 min    How long can you walk comfortably? 5 min    Patient  Stated Goals Improve independence    Currently in Pain? Yes    Pain Score 8     Pain Location Shoulder    Pain Orientation Left    Pain Descriptors / Indicators Aching    Multiple Pain Sites Yes    Pain Score 4    Pain Location Head    Pain Orientation Right;Left    Pain Descriptors / Indicators Aching    Pain Type Chronic pain    Pain Onset More than a month ago    Pain Frequency Constant    Aggravating Factors  cognitive stimulation, use of bil UE, neck motions    Pain Relieving Factors rest                [facility] PT Assessment - 03/03/21 0948       Assessment   Medical Diagnosis Subarachnoid hemorrhage, gait abnormality    Referring Provider (PT) Dr. Naaman Plummer    Onset Date/Surgical Date 02/10/21    Prior Therapy CIR      Precautions   Precautions Fall      Restrictions   Weight Bearing Restrictions No      Balance Screen   Has the patient fallen in the past 6 months No   but reports of 3-4 near falls   Is the patient reluctant to leave their home because of a fear of falling?  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     Cognition   Overall Cognitive Status Impaired/Different from baseline    Area of Impairment Attention;Memory;Safety/judgement;Problem solving    Current Attention Level Focused    Memory Decreased short-term memory      Ambulation/Gait   Ambulation/Gait Yes    Ambulation/Gait Assistance 6: Modified independent (Device/Increase time)    Ambulation Distance (Feet) 115 Feet    Assistive device  Rolling [place]    Gait Pattern Decreased arm swing - right;Decreased arm swing - left;Decreased step length - right;Decreased step length - left;Decreased stance time - right;Decreased stance time - left;Decreased stride length      Standardized Balance Assessment   Standardized Balance Assessment Five Times Sit  to Stand;10 meter walk test;Timed Up and Go Test    Five times sit to stand comments  34 sec   with R UE use   10 Meter Walk 0.41 m/s with RW      Timed Up and Go Test   Normal TUG (seconds) 40.7   with RW                       Objective measurements completed on examination: See above findings.                  PT Short Term Goals - 03/03/21 1032       PT SHORT TERM GOAL #1   Title Patient will report at least 30% reduction in her daily headaches intensity and frequency to improve overall function    Baseline Constant headache that ranges from 4-9/10 daily    Time 6    Period Weeks    Status New    Target Date 04/14/21      PT SHORT TERM GOAL #2   Title Patient will demo improve cervical extension by 5 deg to improve overall cervical ROM    Baseline TBD    Time 6    Period Weeks    Status New    Target Date 04/14/21      PT SHORT TERM GOAL #3   Title Patient will be able to ambulate 25' with RW with SBA without needing  aresting break to improve walking endurance in community    Baseline 115' with RW CGA    Time 6    Period Weeks    Status New    Target Date 04/14/21      PT SHORT TERM GOAL #4   Title Patient will be able to perform sit to stand without use of UE to improve functional strength in LE    Baseline Definite need of R UE use with sit to stand transfer    Time 6    Period Weeks    Status New    Target Date 04/14/21               PT Long Term Goals - 03/03/21 1035       PT LONG TERM GOAL #1   Title Patient will demo 0.18m/s improvement in her 10 meter walk gait speed with RW to progress to community  ambulator    Baseline 0.41 m/s with RW    Time 8    Period Weeks    Status New    Target Date 04/28/21      PT LONG TERM GOAL #2   Title Patient will report headache frequency of <2x/week to improve daily functioning    Baseline constant headache, headache everyday    Time 8    Period Weeks    Status New    Target Date 04/28/21      PT LONG TERM GOAL #3   Title Pt will demo TUG score in <20 seconds with RW to improve functional mobility    Baseline 40.7 seconds with RW    Time 8    Period Weeks    Status New    Target Date 04/28/21      PT LONG TERM GOAL #4   Title Pt will be able to perform 5x sit to stand with or without UE support in <20 seconds to improve functional strength with transfers    Baseline 34 sec    Time 8  Period Weeks    Status New    Target Date 04/28/21                    Plan - 03/03/21 1039     Clinical Impression Statement Patient is a 50 y.o. female who was seen today for physical therapy evaluation and treatment for gait and mobility deficit after subarachnoid hemorrhage in 06/2020. Patient also reports of progressive weakness prior to her stroke. Pt had limitations with cervical ROM and palpable muscle tightness in bil cervicothoracic musculature which may be causing cervicogenic headaches which may be adding to her overall headache symptoms. Patient currently demonstrates decreased functional strength, decreased mobility, gait abnormalities, decreased functional balance, decreased cervical AROM, muscle tightness in neck, headaches, and pain in left shoulder that interfers with her daily functioning. Patient will benefit from skilled PT to address these impairement and improve overall function to meet her short term and long term goals and progress her independence to reduce caregiver burden.    Personal Factors and Comorbidities Comorbidity 2;Past/Current Experience;Time since onset of injury/illness/exacerbation    Comorbidities medical  history of generalized arthritis and myalgias and concern for functional weakness, SLE, irritable bowel,    Examination-Activity Limitations Bathing;Caring for Others;Carry;Dressing;Hygiene/Grooming;Lift;Locomotion Level;Reach Overhead;Squat;Stairs;Stand;Transfers    Examination-Participation Restrictions Church;Cleaning;Community Activity;Driving;Laundry;Medication Management;Meal Prep;Occupation;Shop;Yard Work;Personal Finances    Stability/Clinical Decision Making Evolving/Moderate complexity    Clinical Decision Making Moderate    Rehab Potential Good    PT Frequency 2x / week    PT Duration 8 weeks    PT Treatment/Interventions ADLs/Self Care Home Management;Cryotherapy;Moist Heat;Traction;Gait training;Stair training;Functional mobility training;Therapeutic activities;Therapeutic exercise;Balance training;Manual techniques;Orthotic Fit/Training;Patient/family education;Cognitive remediation;Neuromuscular re-education;Passive range of motion;Energy conservation;Vestibular;Visual/perceptual remediation/compensation;Joint Manipulations;Spinal Manipulations    PT Next Visit Plan Assess cervical AROM, Initiate HEP, family education regarding amount of stimulation to make sure she is not overly stimulated at home getting adequate rest, work on transfers with proprer hand placement, standing balance activities, gait training    PT Home Exercise Plan TBD    Consulted and Agree with Plan of Care Patient;Family member/caregiver    Family Member Consulted daughter             Patient will benefit from skilled therapeutic intervention in order to improve the following deficits and impairments:  Abnormal gait, Decreased activity tolerance, Decreased balance, Decreased cognition, Decreased safety awareness, Decreased range of motion, Decreased mobility, Decreased endurance, Decreased strength, Difficulty walking, Dizziness, Impaired flexibility, Increased fascial restricitons, Hypomobility, Impaired  tone, Impaired UE functional use, Improper body mechanics, Postural dysfunction, Pain  Visit Diagnosis: Other abnormalities of gait and mobility  Neck pain  Cervicogenic headache  Muscle weakness (generalized)     Problem List Patient Active Problem List   Diagnosis Date Noted   Biceps tendonitis on left 02/10/2021   Vascular headache 08/05/2020   Hyponatremia 07/28/2020   Headache 07/24/2020   Neck pain, musculoskeletal 07/24/2020   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    Subarachnoid bleed ([place]) 07/09/2020   Subarachnoid hemorrhage ([place]) 06/29/2020   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[place]) 03/04/2019   S/P hysterectomy 12/13/2017   Abnormal laboratory test 09/09/2016   Gait abnormality 08/03/2016   Weakness 08/03/2016   Paresthesia 08/03/2016   S/P cervical spinal fusion 10/23/2015   Hypersomnia 12/08/2010   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   EPIGASTRIC PAIN 10/13/2009   IRRITABLE BOWEL SYNDROME 03/10/2009   BACK PAIN, LUMBAR 07/01/2008   COSTOCHONDRITIS 02/01/2008   VIRAL URI 01/08/2008   CHEST PAIN 01/08/2008   OTHER SPECIFIED  EPISODIC MOOD DISORDER 12/25/2007   ARM PAIN 12/25/2007   ANEMIA-NOS 10/02/2007   Essential hypertension 10/02/2007   GERD 10/02/2007   POLYP, GALLBLADDER 10/02/2007   UTERINE POLYP 10/02/2007   OSTEOARTHRITIS 10/02/2007   Dyskinesia 10/02/2007   HEADACHE 10/02/2007   PALPITATIONS 10/02/2007   CARDIAC MURMUR, HX OF 10/02/2007   NEPHROLITHIASIS, HX OF 10/02/2007    Kerrie Pleasure, PT 03/03/2021, 10:48 AM  [facility] [address] [place], [place], [address] Phone: [phone]   Fax:  [phone]  Name: ALLYSE FREGEAU MRN: [id number] Date of Birth: 05-Jun-1970

## 2021-03-03 NOTE — Therapy (Signed)
Amy Barry [address] [place], [place], [address] Phone: [phone]   Fax:  [phone]  Occupational Therapy Evaluation  Patient Details  Name: Amy Barry MRN: [id number] Date of Birth: 08/20/1970 Referring Provider (OT): Amy Linsey, MD   Encounter Date: 03/03/2021   OT End of Session - 03/03/21 1138     Visit Number 1    Number of Visits 17    Date for OT Re-Evaluation 05/12/21    Authorization Type [employer]    Authorization Time Period 120 visits PT/OT, 120 visits ST    Authorization - Number of Visits 38    OT Start Time 1105    OT Stop Time 1145    OT Time Calculation (min) 40 min    Activity Tolerance Patient tolerated treatment well    Behavior During Therapy WFL for tasks assessed/performed             Past Medical History:  Diagnosis Date   Anxiety    Arthralgia    Generalized weakness    bilateral upper and lower extremities,  walks with cane   GERD (gastroesophageal reflux disease)    Headache(784.0)    Heart murmur    per pt "since childhood, no problems"   History of kidney stones    Hypertension    followed pcp   IBS (irritable bowel syndrome)    Iron deficiency anemia    12-06-2017 per pt recently had IV iron infusion July 2019   Low vitamin D level    Osteoarthritis    pt unsure about this   PVC (premature ventricular contraction)    per pt had holter monitor   SLE (systemic lupus erythematosus) (Amy Barry)    rheumotologist-- dr Amy Barry   Stroke [facility]) 06/2020   Wears glasses     Past Surgical History:  Procedure Laterality Date   CERVICAL DISC ARTHROPLASTY N/A 10/23/2015   Procedure: Cervical Disc Arthroplasty Cervical six-seven;  Surgeon: Amy Moore, MD;  Location: [facility];  Service: Neurosurgery;  Laterality: N/A;   D & C HYSTEROSCOPY W/ RESECTION ENDOMETRIAL POLYP  10-13-2003    dr rivard @[facility]    ESOPHAGOGASTRODUODENOSCOPY     for GERD   IR ANGIO INTRA EXTRACRAN SEL  COM CAROTID INNOMINATE BILAT MOD SED  07/06/2020   IR ANGIO INTRA EXTRACRAN SEL INTERNAL CAROTID BILAT MOD SED  06/29/2020   IR ANGIO VERTEBRAL SEL VERTEBRAL BILAT MOD SED  06/29/2020   IR ANGIO VERTEBRAL SEL VERTEBRAL BILAT MOD SED  07/06/2020   LAPAROSCOPIC ABDOMINAL EXPLORATION  1996   dx ibs   OVARIAN CYST SURGERY  age 73   x2 cyst   RADIOLOGY WITH ANESTHESIA N/A 06/29/2020   Procedure: IR WITH ANESTHESIA;  Surgeon: Radiologist, Medication, MD;  Location: [facility];  Service: Radiology;  Laterality: N/A;   ROBOTIC ASSISTED TOTAL HYSTERECTOMY N/A 12/13/2017   Procedure: XI ROBOTIC ASSISTED TOTAL HYSTERECTOMYWITH BILATERAL SALPINGECTOMY;  Surgeon: Amy Louis, MD;  Location: [facility];  Service: Gynecology;  Laterality: N/A;   TUBAL LIGATION Bilateral 06-01-2002   dr Mancel Bale @[facility]    PPTL    There were no vitals filed for this visit.   Subjective Assessment - 03/03/21 1056     Subjective  Pt is a 50 year old that presents to Neuro OPOT s/p SAH and biceps tendonitis in LUE. Pt presented to ED on 06/29/20 with AMS. Imaging revealed SAH in possible aneurysmal pattern although CTA negative. PMH significant for HTN, IBS, anemia, connective tissue disorder -  on MTX/Plaquenil, family history of cerebral aneurysms. Pt reports primary concern with her speech, wants to check on the shoulder and walking. Pt reports must challenge with balance, stairs and putting on her clothes (upperbody). Pt lives with her spouse and daughter and is currently not working. 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    Patient is accompanied by: Family member   daughter, Amy Barry   Pertinent History PMH: HTN, IBS, anemia, connective tissue disorder, family history of cerebral aneurysms    Limitations Fall Risk, L shoulder pain, L hemi    Currently in Pain? Yes    Pain Score 10-Worst pain ever    Pain Location Shoulder    Pain Orientation Left    Pain Descriptors / Indicators  Aching;Throbbing    Pain Type Acute pain    Pain Onset More than a month ago    Pain Frequency Constant    Pain Onset --               [facility] OT Assessment - 03/03/21 1102       Assessment   Medical Diagnosis Subarachnoid Hemorrhage (SAH), L Bicep Tendonitis    Referring Provider (OT) Amy Linsey, MD    Onset Date/Surgical Date 02/10/21    Hand Dominance Right    Prior Therapy CIR      Precautions   Precautions Fall    Precaution Comments Anxious, L Hemi      Balance Screen   Has the patient fallen in the past 6 months No    Has the patient had a decrease in activity level because of a fear of falling?  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   Vocation Requirements work from home prior    Leisure baking, reading      ADL   Eating/Feeding Needs assist with cutting food   tries to complete independently   Grooming Minimal assistance   needs assistance for styling/brushing hair   Upper Body Bathing Supervision/safety    Lower Body Bathing Supervision/safety    Upper Body Dressing Minimal assistance   does most but occassionally needs assistance   Lower Body Dressing Minimal assistance   does most but occassionally needs assistance   Toilet Transfer Modified independent    Toileting - Clothing Manipulation Modified independent    [place] Transfer Supervision/safety      IADL   Shopping Completely unable to shop;Needs to be accompanied on any shopping trip     Light Housekeeping Performs light daily tasks but cannot maintain acceptable level of cleanliness    Meal Prep Able to complete simple warm meal prep;Able to complete simple cold meal and snack prep    Community Mobility Relies on family or friends for transportation    Medication Management Takes responsibility if medication is prepared in advance in seperate dosage  Financial Management Requires assistance      Mobility   Mobility Status Comments ambulated with rolling Brian to evaluation with cga      Written Expression   Dominant Hand Right      Vision - History   Baseline Vision Wears glasses all the time   contacts as well   Additional Comments pt reports blurriness and demonstrated difficulty focusing with 88 cancellation 1.5 M although completed with 100% accuracy once focused      Vision Assessment   Comment visual scanning, discrimination and form constancy with 100% accuracy      Cognition   Behaviors Other (comment)   pt with significant anxiety with overstimulation. tearful when becoming overwhelmed     Sensation   Light Touch Appears Intact    Hot/Cold Impaired by gross assessment    Proprioception Appears Intact      Coordination   9 Hole Peg Test Right;Left    Right 9 Hole Peg Test 21.53s    Left 9 Hole Peg Test 32.28s    Box and Blocks R 47 L 34      ROM / Strength   AROM / PROM / Strength AROM;Strength      AROM   Overall AROM  Deficits    Overall AROM Comments limited by pain and hemiparesis on LUE    AROM Assessment Site Shoulder;Elbow    Right/Left Shoulder Left    Left Shoulder Flexion 75 Degrees   90 PROM   Right/Left Elbow --   pt with PROM WFL LUE elbow extension however with AROM initially very limited d/t pain     Strength   Overall Strength Deficits    Overall Strength Comments RUE WFL, LUE deficits - unable to assess d/t pain      Hand Function   Right Hand Gross Grasp Functional    Right Hand Grip (lbs) 39.4 lbs    Left Hand Gross  Grasp Impaired   full composite flexion/extension   Left Hand Grip (lbs) 19.6 lbs                                OT Short Term Goals - 03/03/21 1216       OT SHORT TERM GOAL #1   Title Pt will be independent with initial HEP for LUE    Time 4    Period Weeks    Status New    Target Date 03/31/21      OT SHORT TERM GOAL #2   Title Pt will perform UB dressing with pain no greater than 7/10 in LUE shoulder and with no physical assistance    Time 4    Period Weeks    Status New      OT SHORT TERM GOAL #3   Title Pt will achieve 90 degrees of AROM shoulder flexion with LUE in order to progress to using LUE for functional reaching activities    Baseline 75* AROM - limited by pain    Time 4    Period Weeks    Status New      OT SHORT TERM GOAL #4   Title Pt will increase grip strength in LUE by 5lbs or greater.    Baseline L 19.6, R 39.4    Time 4    Period Weeks    Status New      OT SHORT TERM GOAL #5   Title Pt will perform simple  warm meal prep and/or light housekeeping with supervision and good safety awareness.    Time 4    Period Weeks    Status New               OT Long Term Goals - 03/03/21 1737       OT LONG TERM GOAL #1   Title Pt will be independent with updated HEP for LUE    Time 10    Period Weeks    Status New    Target Date 05/12/21      OT LONG TERM GOAL #2   Title Pt will report pain no greater than 6/10 while completing HEP    Time 10    Period Weeks    Status New      OT LONG TERM GOAL #3   Title Pt will increase fine motor coordination in LUE by completing 9 hole peg test in 25 seconds or less.    Baseline R 21.53s, L 32.28s    Time 10    Period Weeks    Status New      OT LONG TERM GOAL #4   Title Pt will demonstrate ability to obtain a lightweight object at 110 degrees shoulder flexion or greater    Baseline 75    Time 10    Period Weeks    Status New      OT LONG TERM GOAL #5   Title Pt will  increase grip strength in LUE to 30 lbs or greater for increased functional use.    Time 10    Period Weeks                   Plan - 03/03/21 1056     Clinical Impression Statement Pt is a 50 year old that presents to Neuro OPOT s/p SAH and biceps tendonitis in LUE. Pt presented to ED on 06/29/20 with AMS. Imaging revealed SAH in possible aneurysmal pattern although CTA negative. PMH significant for HTN, IBS, anemia, connective tissue disorder - on MTX/Plaquenil, family history of cerebral aneurysms. Pt presents today to OT evaluation with significant LUE weakness, pain and decreased coordination impeding overall ability to complete ADLs and IADLs with prior level of independence. Pt also presents with unsteadiness with functional mobility and visuospatial deficits as well as decreased attention and processing skills. Skilled occupational therapy is recommended to target listed areas of deficit and increase independence with ADLs and IADLs and decrease caregiver burden upon discharge.    OT Occupational Profile and History Detailed Assessment- Review of Records and additional review of physical, cognitive, psychosocial history related to current functional performance    Occupational performance deficits (Please refer to evaluation for details): ADL's;IADL's    Body Structure / Function / Physical Skills ADL;Decreased knowledge of use of DME;Strength;Dexterity;GMC;Pain;UE functional use;IADL;ROM;Vision;Flexibility;Sensation;FMC;Coordination    Cognitive Skills Attention;Problem Solve;Understand;Emotional    Rehab Potential Good    Clinical Decision Making Several treatment options, min-mod task modification necessary    Comorbidities Affecting Occupational Performance: May have comorbidities impacting occupational performance    Modification or Assistance to Complete Evaluation  Min-Moderate modification of tasks or assist with assess necessary to complete eval    OT Frequency 2x / week     OT Duration Other (comment)   16 visits over 10 weeks for any scheduling conflicts   OT Treatment/Interventions Self-care/ADL training;Moist Heat;Fluidtherapy;DME and/or AE instruction;Splinting;Therapeutic activities;Traction;Aquatic Therapy;Ultrasound;Therapeutic exercise;Cognitive remediation/compensation;Visual/perceptual remediation/compensation;Passive range of motion;Functional Mobility Training;Neuromuscular education;Electrical Stimulation;Energy conservation;Manual Therapy;Patient/family education  Plan self PROM/gentle ROM exercises for LUE to get shoulder moving for HEP    OT Home Exercise Plan started with seated self PROM to floor - review at first session    Recommended Other Services seeing PT, ST    Consulted and Agree with Plan of Care Patient;Family member/caregiver    Family Member Consulted daughter, Amy Barry             Patient will benefit from skilled therapeutic intervention in order to improve the following deficits and impairments:   Body Structure / Function / Physical Skills: ADL, Decreased knowledge of use of DME, Strength, Dexterity, GMC, Pain, UE functional use, IADL, ROM, Vision, Flexibility, Sensation, FMC, Coordination Cognitive Skills: Attention, Problem Solve, Understand, Emotional     Visit Diagnosis: Muscle weakness (generalized)  Acute pain of left shoulder  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 (HCC)  Stiffness of left shoulder, not elsewhere classified  Lack of coordination  Attention and concentration deficit  Frontal lobe and executive function deficit  Visuospatial deficit    Problem List Patient Active Problem List   Diagnosis Date Noted   Biceps tendonitis on left 02/10/2021   Vascular headache 08/05/2020   Hyponatremia 07/28/2020   Headache 07/24/2020   Neck pain, musculoskeletal 07/24/2020   Cognitive deficits    Frontal lobe and executive function  deficit    Subarachnoid bleed ([place]) 07/09/2020   Subarachnoid hemorrhage ([place]) 06/29/2020   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[place]) 03/04/2019   S/P hysterectomy 12/13/2017   Abnormal laboratory test 09/09/2016   Gait abnormality 08/03/2016   Weakness 08/03/2016   Paresthesia 08/03/2016   S/P cervical spinal fusion 10/23/2015   Hypersomnia 12/08/2010   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   EPIGASTRIC PAIN 10/13/2009   IRRITABLE BOWEL SYNDROME 03/10/2009   BACK PAIN, LUMBAR 07/01/2008   COSTOCHONDRITIS 02/01/2008   VIRAL URI 01/08/2008   CHEST PAIN 01/08/2008   OTHER SPECIFIED EPISODIC MOOD DISORDER 12/25/2007   ARM PAIN 12/25/2007   ANEMIA-NOS 10/02/2007   Essential hypertension 10/02/2007   GERD 10/02/2007   POLYP, GALLBLADDER 10/02/2007   UTERINE POLYP 10/02/2007   OSTEOARTHRITIS 10/02/2007   Dyskinesia 10/02/2007   HEADACHE 10/02/2007   PALPITATIONS 10/02/2007   CARDIAC MURMUR, HX OF 10/02/2007   NEPHROLITHIASIS, HX OF 10/02/2007    Zachery Conch, OT/L 03/03/2021, 5:40 PM  [place] [address] [place], [place], [address] Phone: [phone]   Fax:  [phone]  Name: Amy Barry MRN: [id number] Date of Birth: 11-May-1970

## 2021-03-03 NOTE — Therapy (Signed)
[facility] [address] [place], [place], [address] Phone: [phone]   Fax:  [phone]  Speech Language Pathology Evaluation  Patient Details  Name: Amy Barry MRN: [id number] Date of Birth: July 09, 1970 Referring Provider (SLP): Alger Simons MD   Encounter Date: 03/03/2021   End of Session - 03/03/21 1244     Visit Number 1    Number of Visits 17    Date for SLP Re-Evaluation 05/03/21    SLP Start Time 65    SLP Stop Time  1105    SLP Time Calculation (min) 40 min    Activity Tolerance Patient tolerated treatment well             Past Medical History:  Diagnosis Date   Anxiety    Arthralgia    Generalized weakness    bilateral upper and lower extremities,  walks with cane   GERD (gastroesophageal reflux disease)    Headache(784.0)    Heart murmur    per pt "since childhood, no problems"   History of kidney stones    Hypertension    followed pcp   IBS (irritable bowel syndrome)    Iron deficiency anemia    12-06-2017 per pt recently had IV iron infusion July 2019   Low vitamin D level    Osteoarthritis    pt unsure about this   PVC (premature ventricular contraction)    per pt had holter monitor   SLE (systemic lupus erythematosus) ([place])    rheumotologist-- dr syed   Stroke [facility]) 06/2020   Wears glasses     Past Surgical History:  Procedure Laterality Date   CERVICAL DISC ARTHROPLASTY N/A 10/23/2015   Procedure: Cervical Disc Arthroplasty Cervical six-seven;  Surgeon: Eustace Moore, MD;  Location: [facility];  Service: Neurosurgery;  Laterality: N/A;   D & C HYSTEROSCOPY W/ RESECTION ENDOMETRIAL POLYP  10-13-2003    dr rivard @[facility]    ESOPHAGOGASTRODUODENOSCOPY     for GERD   IR ANGIO INTRA EXTRACRAN SEL COM CAROTID INNOMINATE BILAT MOD SED  07/06/2020   IR ANGIO INTRA EXTRACRAN SEL INTERNAL CAROTID BILAT MOD SED  06/29/2020   IR ANGIO VERTEBRAL SEL VERTEBRAL BILAT MOD SED  06/29/2020    IR ANGIO VERTEBRAL SEL VERTEBRAL BILAT MOD SED  07/06/2020   LAPAROSCOPIC ABDOMINAL EXPLORATION  1996   dx ibs   OVARIAN CYST SURGERY  age 57   x2 cyst   RADIOLOGY WITH ANESTHESIA N/A 06/29/2020   Procedure: IR WITH ANESTHESIA;  Surgeon: Radiologist, Medication, MD;  Location: [place];  Service: Radiology;  Laterality: N/A;   ROBOTIC ASSISTED TOTAL HYSTERECTOMY N/A 12/13/2017   Procedure: XI ROBOTIC ASSISTED TOTAL HYSTERECTOMYWITH BILATERAL SALPINGECTOMY;  Surgeon: Christophe Louis, MD;  Location: [facility];  Service: Gynecology;  Laterality: N/A;   TUBAL LIGATION Bilateral 06-01-2002   dr Mancel Bale @[facility]    PPTL    There were no vitals filed for this visit.   Subjective Assessment - 03/03/21 1114     Subjective Pt became tearful and speech became unintelligible/dysarthric during session. Daughter reports this is common when she has difficulty completing a task and has anxiety. SLP worked through breathing exercises. Speech returned to baseline.    Currently in Pain? Yes    Pain Score 8     Pain Location Shoulder    Pain Orientation Left    Pain Descriptors / Indicators Aching  SLP Evaluation [facility] - 03/03/21 1031       SLP Visit Information   SLP Received On 03/03/21    Referring Provider (SLP) Alger Simons MD    Onset Date 06/29/20    Medical Diagnosis SAH      Subjective   Patient/Family Stated Goal "I want to go out again."      General Information   HPI Amy Barry is a 50 y.o. female with history of HTN, IBS, connective tissue disorder, history of cerebral aneurysms who was admitted on 06/29/2020 after found down by daughter with slurred speech and confusion.  She was found to have [place] in posterior fossa felt to be aneurysmal in nature.  CTA head/neck did not show a definite source of bleeding but poor visualization of proximal left-ICA with tiny bulbous area.  She underwent cerebral angiogram (which was negative for AVM, aneurysm, dissection or fistula) with  placement of right frontal EVD by Dr. Kathyrn Sheriff.   She has had bouts of lethargy and did develop small hematoma along course of brain and right frontal lobe. Serial CT head negative for hydrocephalus. As SAH and IVH resolved, intraventricular drain was removed on 04/06 and repeat cerebral angiogram was negative for aneurysm, fistula.  Patient with progressive hyponatremia, transient hypokalemia, issues with headaches and neck stiffness as well as back spasms. Pt recieved IPR and HH therapies and continues to present with cognitive impairment.      Balance Screen   Has the patient fallen in the past 6 months No    Has the patient had a decrease in activity level because of a fear of falling?  No    Is the patient reluctant to leave their home because of a fear of falling?  No      Prior Functional Status   Cognitive/Linguistic Baseline Within functional limits    Type of Home House     Lives With Spouse;Family    Education [employer]    Vocation Full time employment      Cognition   Overall Cognitive Status Impaired/Different from baseline    Area of Impairment Attention;Memory;Safety/judgement;Problem solving    Current Attention Level Focused      Auditory Comprehension   Overall Auditory Comprehension Appears within functional limits for tasks assessed      Verbal Expression   Overall Verbal Expression Appears within functional limits for tasks assessed      Motor Speech   Overall Motor Speech Appears within functional limits for tasks assessed      Standardized Assessments   Standardized Assessments  Other Assessment    Other Assessment Unable to fully complete CLQT - to cont next session                    SLP Education - 03/03/21 1117     Education Details Anxiety and brain injury; SLP role in stroke recovery    Person(s) Educated Patient;Child(ren)    Methods Explanation;Demonstration;Handout    Comprehension Verbalized understanding;Need further instruction               SLP Short Term Goals - 03/03/21 1245       SLP SHORT TERM GOAL #1   Title Complete CLQT to determine extent of cognitive deficits.    Time 2    Period Weeks    Status New    Target Date 03/17/21                Plan - 03/03/21 1246  Clinical Impression Statement Pt is a 50 yo female who presents for OP ST evaluation post SAH on 06/29/20. Pt has had a course of ST services through [facility] per report of daughter. Upon entering room, pt demonstrated delayed processing of verbal information while therapist was asking case hx questions. Pt endorsed difficulty with re: attention, memory, brain fog, processing and extreme noise sensitvity. She also reported she was recently dx with presbyesophagus and "really chews her food." She reports medication and additional mastication have been successful. Pt reacts to extreme over-stimulation in loud and/or preceived stressful environments with what pt refers to as "an episode". We determined she refers to being "overstimulated" as an "episode". When beginning assessment, pt was able to complete "Personal Facts" (8/8) and "Confrontration naming" (10/10) on the  CLQT. When attempting to complete Story Retell, pt was only able to recall 2 items from the paragraph (2/18). At this time, pt began to cry and exhibited effortful, unintelligible/dysarthic speech. Daughter told SLP this is what her "episodes look like" and "she just has to calm down." Reportedly, this happens frequently in speech therapy when a task is too difficult. SLP turned off lights and practiced deep breathing. This eventually soothed patient and her speech returned to normal. SLP provided pt with "Relaxation after BI" handout from [employer] [facility] [place]. SLP suggested speaking with her doctor about counseling services and/or medication options if anxiety continues. Pt reported, "I don't want to go to a counselor". SLP rec f/u with doc/mental health services as this  reaction to stress was significant. SLP rec skilled speech services to address cognitive impairment, as we all as provide edu on diet modifications for presbyesophagus. *To complete CLQT next session.    Speech Therapy Frequency 2x / week    Duration 8 weeks    Treatment/Interventions Compensatory strategies;Cueing hierarchy;Functional tasks;Patient/family education;Diet toleration management by SLP;Environmental controls;Cognitive reorganization;Multimodal communcation approach;Compensatory techniques;Internal/external aids;SLP instruction and feedback    Potential to Achieve Goals Fair    Potential Considerations Other (comment)   Anxiety   Consulted and Agree with Plan of Care Patient;Family member/caregiver    Family Member Consulted daughter Debe Coder             Patient will benefit from skilled therapeutic intervention in order to improve the following deficits and impairments:   Cognitive communication deficit    Problem List Patient Active Problem List   Diagnosis Date Noted   Biceps tendonitis on left 02/10/2021   Vascular headache 08/05/2020   Hyponatremia 07/28/2020   Headache 07/24/2020   Neck pain, musculoskeletal 07/24/2020   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    Subarachnoid bleed ([place]) 07/09/2020   Subarachnoid hemorrhage ([place]) 06/29/2020   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[place]) 03/04/2019   S/P hysterectomy 12/13/2017   Abnormal laboratory test 09/09/2016   Gait abnormality 08/03/2016   Weakness 08/03/2016   Paresthesia 08/03/2016   S/P cervical spinal fusion 10/23/2015   Hypersomnia 12/08/2010   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   EPIGASTRIC PAIN 10/13/2009   IRRITABLE BOWEL SYNDROME 03/10/2009   BACK PAIN, LUMBAR 07/01/2008   COSTOCHONDRITIS 02/01/2008   VIRAL URI 01/08/2008   CHEST PAIN 01/08/2008   OTHER SPECIFIED EPISODIC MOOD DISORDER 12/25/2007   ARM PAIN 12/25/2007   ANEMIA-NOS 10/02/2007   Essential hypertension 10/02/2007   GERD  10/02/2007   POLYP, GALLBLADDER 10/02/2007   UTERINE POLYP 10/02/2007   OSTEOARTHRITIS 10/02/2007   Dyskinesia 10/02/2007   HEADACHE 10/02/2007   PALPITATIONS 10/02/2007   CARDIAC MURMUR,  HX OF 10/02/2007   NEPHROLITHIASIS, HX OF 10/02/2007    Rosann Auerbach Girard, CCC-SLP 03/03/2021, 2:39 PM  [facility] [address] [place], [place], [address] Phone: [phone]   Fax:  [phone]  Name: CHRISANNA MISHRA MRN: [id number] Date of Birth: 04-16-1970

## 2021-03-09 ENCOUNTER — Encounter: Payer: Self-pay | Admitting: Speech Pathology

## 2021-03-09 ENCOUNTER — Other Ambulatory Visit: Payer: Self-pay

## 2021-03-09 ENCOUNTER — Ambulatory Visit: Payer: No Typology Code available for payment source | Admitting: Speech Pathology

## 2021-03-09 ENCOUNTER — Ambulatory Visit: Payer: No Typology Code available for payment source | Attending: Family Medicine | Admitting: Occupational Therapy

## 2021-03-09 DIAGNOSIS — R2689 Other abnormalities of gait and mobility: Secondary | ICD-10-CM | POA: Diagnosis present

## 2021-03-09 DIAGNOSIS — M25612 Stiffness of left shoulder, not elsewhere classified: Secondary | ICD-10-CM | POA: Insufficient documentation

## 2021-03-09 DIAGNOSIS — R41840 Attention and concentration deficit: Secondary | ICD-10-CM | POA: Diagnosis present

## 2021-03-09 DIAGNOSIS — I69054 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: Secondary | ICD-10-CM | POA: Insufficient documentation

## 2021-03-09 DIAGNOSIS — R279 Unspecified lack of coordination: Secondary | ICD-10-CM | POA: Diagnosis present

## 2021-03-09 DIAGNOSIS — R41844 Frontal lobe and executive function deficit: Secondary | ICD-10-CM | POA: Insufficient documentation

## 2021-03-09 DIAGNOSIS — M6281 Muscle weakness (generalized): Secondary | ICD-10-CM | POA: Insufficient documentation

## 2021-03-09 DIAGNOSIS — R41841 Cognitive communication deficit: Secondary | ICD-10-CM | POA: Diagnosis present

## 2021-03-09 DIAGNOSIS — M25512 Pain in left shoulder: Secondary | ICD-10-CM | POA: Insufficient documentation

## 2021-03-09 NOTE — Therapy (Signed)
[facility] [address] [place], [place], [address] Phone: [phone]   Fax:  [phone]  Occupational Therapy Treatment  Patient Details  Name: Amy Barry MRN: [id number] Date of Birth: Sep 24, 1970 Referring Provider (OT): Oval Linsey, MD   Encounter Date: 03/09/2021   OT End of Session - 03/09/21 1154     Visit Number 2    Number of Visits 17    Date for OT Re-Evaluation 05/12/21    Authorization Type [employer]    Authorization Time Period 120 visits PT/OT, 120 visits ST    Authorization - Number of Visits 78    OT Start Time 1150    OT Stop Time 1230    OT Time Calculation (min) 40 min    Activity Tolerance Patient tolerated treatment well    Behavior During Therapy WFL for tasks assessed/performed             Past Medical History:  Diagnosis Date   Anxiety    Arthralgia    Generalized weakness    bilateral upper and lower extremities,  walks with cane   GERD (gastroesophageal reflux disease)    Headache(784.0)    Heart murmur    per pt "since childhood, no problems"   History of kidney stones    Hypertension    followed pcp   IBS (irritable bowel syndrome)    Iron deficiency anemia    12-06-2017 per pt recently had IV iron infusion July 2019   Low vitamin D level    Osteoarthritis    pt unsure about this   PVC (premature ventricular contraction)    per pt had holter monitor   SLE (systemic lupus erythematosus) ([place])    rheumotologist-- dr syed   Stroke [facility]) 06/2020   Wears glasses     Past Surgical History:  Procedure Laterality Date   CERVICAL DISC ARTHROPLASTY N/A 10/23/2015   Procedure: Cervical Disc Arthroplasty Cervical six-seven;  Surgeon: Eustace Moore, MD;  [facility]: [facility];  Service: Neurosurgery;  Laterality: N/A;   D & C HYSTEROSCOPY W/ RESECTION ENDOMETRIAL POLYP  10-13-2003    dr rivard @[facility]    ESOPHAGOGASTRODUODENOSCOPY     for GERD   IR ANGIO INTRA EXTRACRAN SEL COM  CAROTID INNOMINATE BILAT MOD SED  07/06/2020   IR ANGIO INTRA EXTRACRAN SEL INTERNAL CAROTID BILAT MOD SED  06/29/2020   IR ANGIO VERTEBRAL SEL VERTEBRAL BILAT MOD SED  06/29/2020   IR ANGIO VERTEBRAL SEL VERTEBRAL BILAT MOD SED  07/06/2020   LAPAROSCOPIC ABDOMINAL EXPLORATION  1996   dx ibs   OVARIAN CYST SURGERY  age 50   x2 cyst   RADIOLOGY WITH ANESTHESIA N/A 06/29/2020   Procedure: IR WITH ANESTHESIA;  Surgeon: Radiologist, Medication, MD;  Location: [facility];  Service: Radiology;  Laterality: N/A;   ROBOTIC ASSISTED TOTAL HYSTERECTOMY N/A 12/13/2017   Procedure: XI ROBOTIC ASSISTED TOTAL HYSTERECTOMYWITH BILATERAL SALPINGECTOMY;  Surgeon: Christophe Louis, MD;  Location[facility];  Service: Gynecology;  Laterality: N/A;   TUBAL LIGATION Bilateral 06-01-2002   dr Mancel Bale @[facility]    PPTL    There were no vitals filed for this visit.   Subjective Assessment - 03/09/21 1153     Subjective  "i'm ok i guess - i woke up and my arm was giving me a bunch of problems"    Patient is accompanied by: Family member   daughter, [place]   Pertinent History PMH: HTN, IBS, anemia, connective tissue disorder, family history of  cerebral aneurysms    Limitations Fall Risk, L shoulder pain, L hemi    Currently in Pain? Yes    Pain Score 8     Pain Location Shoulder    Pain Orientation Left    Pain Descriptors / Indicators Aching    Pain Type Acute pain    Pain Onset More than a month ago    Pain Frequency Constant    Aggravating Factors  moving it, changing clothes    Pain Relieving Factors heat              Reviewed goals. Pt agreeable.  NMR Gentle weight on left extended arm in sidelying with gentle rolling to back and to left side for increasing weight in LUE shoulder. AROM for elbow extension and flexion, supination and pronation and composite flexion/extension in hand..   Physioball x 10 forward reaching for increased range of motion and tolerating shoulder flexion  Hemi Glide in sitting with forward  reaching/scap retraction and protraction and horizontal abduction, minimal circumduction                   OT Education - 03/09/21 1223     Education Details gentle roll to left side with arm extended, self prom to floor - see pt instructions    Person(s) Educated Spouse;Patient    Methods Explanation;Demonstration;Handout    Comprehension Verbalized understanding;Returned demonstration;Need further instruction              OT Short Term Goals - 03/03/21 1216       OT SHORT TERM GOAL #1   Title Pt will be independent with initial HEP for LUE    Time 4    Period Weeks    Status New    Target Date 03/31/21      OT SHORT TERM GOAL #2   Title Pt will perform UB dressing with pain no greater than 7/10 in LUE shoulder and with no physical assistance    Time 4    Period Weeks    Status New      OT SHORT TERM GOAL #3   Title Pt will achieve 90 degrees of AROM shoulder flexion with LUE in order to progress to using LUE for functional reaching activities    Baseline 75* AROM - limited by pain    Time 4    Period Weeks    Status New      OT SHORT TERM GOAL #4   Title Pt will increase grip strength in LUE by 5lbs or greater.    Baseline L 19.6, R 39.4    Time 4    Period Weeks    Status New      OT SHORT TERM GOAL #5   Title Pt will perform simple warm meal prep and/or light housekeeping with supervision and good safety awareness.    Time 4    Period Weeks    Status New               OT Long Term Goals - 03/03/21 1737       OT LONG TERM GOAL #1   Title Pt will be independent with updated HEP for LUE    Time 10    Period Weeks    Status New    Target Date 05/12/21      OT LONG TERM GOAL #2   Title Pt will report pain no greater than 6/10 while completing HEP    Time 10  Period Weeks    Status New      OT LONG TERM GOAL #3   Title Pt will increase fine motor coordination in LUE by completing 9 hole peg test in 25 seconds or less.     Baseline R 21.53s, L 32.28s    Time 10    Period Weeks    Status New      OT LONG TERM GOAL #4   Title Pt will demonstrate ability to obtain a lightweight object at 110 degrees shoulder flexion or greater    Baseline 75    Time 10    Period Weeks    Status New      OT LONG TERM GOAL #5   Title Pt will increase grip strength in LUE to 30 lbs or greater for increased functional use.    Time 10    Period Weeks                   Plan - 03/09/21 1215     Clinical Impression Statement Pt with excessive guarding and learned non use of LUE.    OT Occupational Profile and History Detailed Assessment- Review of Records and additional review of physical, cognitive, psychosocial history related to current functional performance    Occupational performance deficits (Please refer to evaluation for details): ADL's;IADL's    Body Structure / Function / Physical Skills ADL;Decreased knowledge of use of DME;Strength;Dexterity;GMC;Pain;UE functional use;IADL;ROM;Vision;Flexibility;Sensation;FMC;Coordination    Cognitive Skills Attention;Problem Solve;Understand;Emotional    Rehab Potential Good    Clinical Decision Making Several treatment options, min-mod task modification necessary    Comorbidities Affecting Occupational Performance: May have comorbidities impacting occupational performance    Modification or Assistance to Complete Evaluation  Min-Moderate modification of tasks or assist with assess necessary to complete eval    OT Frequency 2x / week    OT Duration Other (comment)   16 visits over 10 weeks for any scheduling conflicts   OT Treatment/Interventions Self-care/ADL training;Moist Heat;Fluidtherapy;DME and/or AE instruction;Splinting;Therapeutic activities;Traction;Aquatic Therapy;Ultrasound;Therapeutic exercise;Cognitive remediation/compensation;Visual/perceptual remediation/compensation;Passive range of motion;Functional Mobility Training;Neuromuscular education;Electrical  Stimulation;Energy conservation;Manual Therapy;Patient/family education    Plan self PROM/gentle ROM exercises for LUE to get shoulder moving for HEP, sleep positions    OT Home Exercise Plan started with seated self PROM to floor - review at first session    Recommended Other Services seeing PT, ST    Consulted and Agree with Plan of Care Patient;Family member/caregiver    Family Member Consulted daughter, Debe Coder             Patient will benefit from skilled therapeutic intervention in order to improve the following deficits and impairments:   Body Structure / Function / Physical Skills: ADL, Decreased knowledge of use of DME, Strength, Dexterity, GMC, Pain, UE functional use, IADL, ROM, Vision, Flexibility, Sensation, FMC, Coordination Cognitive Skills: Attention, Problem Solve, Understand, Emotional     Visit Diagnosis: Stiffness of left shoulder, not elsewhere classified  Lack of coordination  Attention and concentration deficit  Frontal lobe and executive function deficit  Acute pain of left shoulder  Muscle weakness (generalized)  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 (HCC)    Problem List Patient Active Problem List   Diagnosis Date Noted   Biceps tendonitis on left 02/10/2021   Vascular headache 08/05/2020   Hyponatremia 07/28/2020   Headache 07/24/2020   Neck pain, musculoskeletal 07/24/2020   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    Subarachnoid  bleed ([place]) 07/09/2020   Subarachnoid hemorrhage ([place]) 06/29/2020   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[place]) 03/04/2019   S/P hysterectomy 12/13/2017   Abnormal laboratory test 09/09/2016   Gait abnormality 08/03/2016   Weakness 08/03/2016   Paresthesia 08/03/2016   S/P cervical spinal fusion 10/23/2015   Hypersomnia 12/08/2010   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   EPIGASTRIC PAIN 10/13/2009   IRRITABLE BOWEL SYNDROME 03/10/2009   BACK  PAIN, LUMBAR 07/01/2008   COSTOCHONDRITIS 02/01/2008   VIRAL URI 01/08/2008   CHEST PAIN 01/08/2008   OTHER SPECIFIED EPISODIC MOOD DISORDER 12/25/2007   ARM PAIN 12/25/2007   ANEMIA-NOS 10/02/2007   Essential hypertension 10/02/2007   GERD 10/02/2007   POLYP, GALLBLADDER 10/02/2007   UTERINE POLYP 10/02/2007   OSTEOARTHRITIS 10/02/2007   Dyskinesia 10/02/2007   HEADACHE 10/02/2007   PALPITATIONS 10/02/2007   CARDIAC MURMUR, HX OF 10/02/2007   NEPHROLITHIASIS, HX OF 10/02/2007    Zachery Conch, OT 03/09/2021, 2:29 PM  [facility] [address] [place], [place], [address] Phone: [phone]   Fax:  [phone]  Name: RUTHY FORRY MRN: [id number] Date of Birth: 13-Feb-1971

## 2021-03-09 NOTE — Therapy (Signed)
[facility] [name] [place], [place], [address] Phone: [phone]   Fax:  [phone]  Speech Language Pathology Treatment  Patient Details  Name: Amy Barry MRN: [id number] Date of Birth: [birth year] Referring Provider (SLP): Alger Simons MD   Encounter Date: 03/09/2021   End of Session - 03/09/21 1741     Visit Number 2    Number of Visits 17    Date for SLP Re-Evaluation 05/03/21    SLP Start Time 11    SLP Stop Time  1100    SLP Time Calculation (min) 45 min             Past Medical History:  Diagnosis Date   Anxiety    Arthralgia    Generalized weakness    bilateral upper and lower extremities,  walks with cane   GERD (gastroesophageal reflux disease)    Headache(784.0)    Heart murmur    per pt "since childhood, no problems"   History of kidney stones    Hypertension    followed pcp   IBS (irritable bowel syndrome)    Iron deficiency anemia    12-06-2017 per pt recently had IV iron infusion July 2019   Low vitamin D level    Osteoarthritis    pt unsure about this   PVC (premature ventricular contraction)    per pt had holter monitor   SLE (systemic lupus erythematosus) ([place])    rheumotologist-- dr syed   Stroke [facility]) 06/2020   Wears glasses     Past Surgical History:  Procedure Laterality Date   CERVICAL DISC ARTHROPLASTY N/A 10/23/2015   Procedure: Cervical Disc Arthroplasty Cervical six-seven;  Surgeon: Eustace Moore, MD;  Location: [facility];  Service: Neurosurgery;  Laterality: N/A;   D & C HYSTEROSCOPY W/ RESECTION ENDOMETRIAL POLYP  10-13-2003    dr rivard @[facility]    ESOPHAGOGASTRODUODENOSCOPY     for GERD   IR ANGIO INTRA EXTRACRAN SEL COM CAROTID INNOMINATE BILAT MOD SED  07/06/2020   IR ANGIO INTRA EXTRACRAN SEL INTERNAL CAROTID BILAT MOD SED  06/29/2020   IR ANGIO VERTEBRAL SEL VERTEBRAL BILAT MOD SED  06/29/2020   IR ANGIO VERTEBRAL SEL VERTEBRAL BILAT MOD SED  07/06/2020    LAPAROSCOPIC ABDOMINAL EXPLORATION  1996   dx ibs   OVARIAN CYST SURGERY  age 53   x2 cyst   RADIOLOGY WITH ANESTHESIA N/A 06/29/2020   Procedure: IR WITH ANESTHESIA;  Surgeon: Radiologist, Medication, MD;  Location: [facility];  Service: Radiology;  Laterality: N/A;   ROBOTIC ASSISTED TOTAL HYSTERECTOMY N/A 12/13/2017   Procedure: XI ROBOTIC ASSISTED TOTAL HYSTERECTOMYWITH BILATERAL SALPINGECTOMY;  Surgeon: Christophe Louis, MD;  Location: [facility];  Service: Gynecology;  Laterality: N/A;   TUBAL LIGATION Bilateral 06-01-2002   dr Mancel Bale @[facility]    PPTL    There were no vitals filed for this visit.   Subjective Assessment - 03/09/21 1022     Subjective Points - when asked who is with you today    Patient is accompained by: Family member   Amy Barry   Currently in Pain? Yes    Pain Score 8     Pain Location Shoulder    Pain Orientation Left    Pain Descriptors / Indicators Aching    Pain Type Acute pain                   ADULT SLP TREATMENT - 03/09/21 1023  General Information   Behavior/Cognition Alert;Cooperative;Pleasant mood      Treatment Provided   Treatment provided Cognitive-Linquistic      Cognitive-Linquistic Treatment   Treatment focused on Cognition;Dysarthria;Patient/family/caregiver education    Skilled Treatment Pt accompanied by spouse, They report that prior to hospitalization, pt handled finances, which her spouse is now doing. Daughter is helping Amy Barry sort her meds in her oragnizer, and Amy Barry completed meal planning, grocery lists and cooking while Amy Barry did [employer]. Her daughter is also keeping a calendar and managing appointments for Amy Barry. Administered The NeuroQOL Cognitive Function PROM, with input from pt and spouse. Amy Barry scored a 39/140. They noted daily difficulty planning activities, remembering where things were placed, keeping track of what she is doing, slow thinking, trouble forming her thoughts, forgetting the name of a familiar person and  words being on the tip of her tongue. The symbol cancellation subtest of the CLQT revealed a score of 8/12, Clock drawing showed the hands placed at 11 and 10, instead of 11 and 2. She named 11 animals and 4 "m" words in 1 minute (15-20 is WNL). Overall response to tasks and verbal responses were very slow, taking all of the time allowed on the subtests, and running out of time as well. Volume is low and at times inaudible. When asked, her spouse said they had no trouble hearing her at home. Towards the end of the session, Amy Barry observed Amy Barry to stare blankly at the wall. He stated "she isn't listening to you right now. See how she stares without blinking, she is having an episode" about 15 seconds later, he stated "she's back." However, in questioning, they both note that Amy Barry has had success in cooking and baked a cake from scratch. She endorses burning bacon, but other wise success cooking simple items.She is texting, emailing, on line shopping and completing light cleaning with success. This is inconsistent with very slow response time, poor error awareness, reduced attention,  difficulty standing and zoning out observed today. Affect and speech flat with brow furrowed the entire session      Assessment / Recommendations / Plan   Plan Goals updated      Progression Toward Goals   Progression toward goals Progressing toward goals              SLP Education - 03/09/21 1728     Education Details initiate schedule, pill sort, calendar management    Person(s) Educated Patient    Methods Explanation;Verbal cues;Handout    Comprehension Verbalized understanding;Verbal cues required;Need further instruction              SLP Short Term Goals - 03/09/21 1733       SLP SHORT TERM GOAL #1   Title Complete CLQT to determine extent of cognitive deficits.    Time 2    Period Weeks    Status On-going    Target Date 03/17/21      SLP SHORT TERM GOAL #2   Title Pt will manage her calendar and  appointments with rare min A from family over 2 weeks    Time 4    Period Weeks    Status New      SLP SHORT TERM GOAL #3   Title Pt will sort pills into organizer independently with supervision cues from family    Time 4    Period Weeks    Status New      SLP SHORT TERM GOAL #4   Title Pt will  pay 3 bills with occasional min A from family    Time 4    Period Weeks    Status New      SLP SHORT TERM GOAL #5   Title Pt will carryover 3 compensatory strategies to complete 2 IADL's with occasional min A from family    Time 4    Period Weeks    Status New              SLP Long Term Goals - 03/09/21 1736       SLP LONG TERM GOAL #1   Title Pt will independently manage her daily schedule, appointments, to do lists over 1 week    Time 8    Period Weeks    Status New      SLP LONG TERM GOAL #2   Title Pt will use compensations for organization and attention to pay 5 bills with rare min A from family    Time 8    Period Weeks    Status New      SLP LONG TERM GOAL #3   Title Pt will be independent in medication management with no A from family    Time 8    Period Weeks    Status New      SLP LONG TERM GOAL #4   Title Pt will improve score on NeuroQOL Cognitive Function PROM (initial score 39) by 4 points    Time 8    Period Weeks    Status New      SLP LONG TERM GOAL #5   Title Pt will carryover compensatory strategies for alternating attention in cooking and IADL's with rare min A from family    Time 8    Period Weeks    Status New              Plan - 03/09/21 1728     Clinical Impression Statement Congitive impairments persist, with very delayed responses, faint volume, flat affect. CLQT subtests completed. Performance on CLQT inconsistent with successes in baking and cooking. Atypical episode of "zoning out" staring off without blinking, her spouse endorses this occurs at home. Goals added, initiated training in compensations for memory and attention for  simple ADL's. Continue skilled ST to maximize cognition for safety, independence and return to PLOF. CLQT to be completed in net 3 sessions, as pt tolerates short periods of testing due to anxiety.    Speech Therapy Frequency 2x / week    Duration 8 weeks    Treatment/Interventions Compensatory strategies;Cueing hierarchy;Functional tasks;Patient/family education;Diet toleration management by SLP;Environmental controls;Cognitive reorganization;Multimodal communcation approach;Compensatory techniques;Internal/external aids;SLP instruction and feedback    Potential to Achieve Goals Fair    Potential Considerations --   anxiety            Patient will benefit from skilled therapeutic intervention in order to improve the following deficits and impairments:   Cognitive communication deficit    Problem List Patient Active Problem List   Diagnosis Date Noted   Biceps tendonitis on left 02/10/2021   Vascular headache 08/05/2020   Hyponatremia 07/28/2020   Headache 07/24/2020   Neck pain, musculoskeletal 07/24/2020   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    Subarachnoid bleed ([place]) 07/09/2020   Subarachnoid hemorrhage ([place]) 06/29/2020   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[place]) 03/04/2019   S/P hysterectomy 12/13/2017   Abnormal laboratory test 09/09/2016   Gait abnormality 08/03/2016   Weakness 08/03/2016   Paresthesia 08/03/2016   S/P cervical  spinal fusion 10/23/2015   Hypersomnia 12/08/2010   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   EPIGASTRIC PAIN 10/13/2009   IRRITABLE BOWEL SYNDROME 03/10/2009   BACK PAIN, LUMBAR 07/01/2008   COSTOCHONDRITIS 02/01/2008   VIRAL URI 01/08/2008   CHEST PAIN 01/08/2008   OTHER SPECIFIED EPISODIC MOOD DISORDER 12/25/2007   ARM PAIN 12/25/2007   ANEMIA-NOS 10/02/2007   Essential hypertension 10/02/2007   GERD 10/02/2007   POLYP, GALLBLADDER 10/02/2007   UTERINE POLYP 10/02/2007   OSTEOARTHRITIS 10/02/2007   Dyskinesia 10/02/2007   HEADACHE  10/02/2007   PALPITATIONS 10/02/2007   CARDIAC MURMUR, HX OF 10/02/2007   NEPHROLITHIASIS, HX OF 10/02/2007    Sianne Tejada, Annye Rusk, CCC-SLP 03/09/2021, 5:42 PM  [facility] [address] [place], [place], [address] Phone: [phone]   Fax:  [phone]   Name: SHELITHA MAGLEY MRN: [id number] Date of Birth: 02-27-1971

## 2021-03-09 NOTE — Patient Instructions (Addendum)
   3 ring binder with sections for OT, PT and ST - Amy Barry will be responsible for bringing this each session  Amy Barry - start to be the person who writes your appointments on the calendar - cross each day as it is over  Help Amy Barry remember her meds if she is not, by helping her set an alarm on her phone, or check the day and have her see if she took them  In rehab, our goal is to gradually get Amy Barry back to doing what she did before (minding her physical limitations). Please help her to help herself, and if there is a task she can do and did before the stroke, encourage her to do it, rather than automatically doing it for her.      Checkers Writer [employer] games Jig saw puzzles Easy cross words Memory match Board games [employer] Learn a new game!  Listen to and discuss Ted Talks or Podcasts Read and discuss short articles of interest to you- Take notes on these if memory is a challenge Discuss social media posts Look and discuss photo albums  The best activities to improve cognition are functional, real life activities that are important to you:  Plan a menu Participate in household chores and decisions (with supervision) Participate in [place] a party, trip or tailgate with all of the details (even if you aren't really going to carry it out) Participate in your hobby as you are able with assistance Manage your texts, emails with supervision if needed. Google search for items (even if you're not really going to buy anything) and compare prices and features Socialize -  however, too many visitors can be overwhelming, so set limits "My doctor said I should only visit (or talk) for 20 minutes" or "I do better when I visit with just 1-2 people at a time for 20 minutes"    It's good to use real in-person games, not just apps  Apps:  Ne[employer] Elevate There are apps for most of the games listed above

## 2021-03-09 NOTE — Patient Instructions (Signed)
Gentle rolling onto L side with arm extended out. Repeat 10 times.  Reaching to floor with arms. Repeat x 10

## 2021-03-10 ENCOUNTER — Ambulatory Visit: Payer: No Typology Code available for payment source | Admitting: Speech Pathology

## 2021-03-10 ENCOUNTER — Ambulatory Visit: Payer: No Typology Code available for payment source | Admitting: Occupational Therapy

## 2021-03-10 ENCOUNTER — Encounter: Payer: Self-pay | Admitting: Occupational Therapy

## 2021-03-10 ENCOUNTER — Encounter: Payer: Self-pay | Admitting: Speech Pathology

## 2021-03-10 DIAGNOSIS — R41841 Cognitive communication deficit: Secondary | ICD-10-CM

## 2021-03-10 DIAGNOSIS — I69054 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: Secondary | ICD-10-CM

## 2021-03-10 DIAGNOSIS — M25612 Stiffness of left shoulder, not elsewhere classified: Secondary | ICD-10-CM | POA: Diagnosis not present

## 2021-03-10 DIAGNOSIS — M25512 Pain in left shoulder: Secondary | ICD-10-CM

## 2021-03-10 DIAGNOSIS — R279 Unspecified lack of coordination: Secondary | ICD-10-CM

## 2021-03-10 DIAGNOSIS — R41844 Frontal lobe and executive function deficit: Secondary | ICD-10-CM

## 2021-03-10 DIAGNOSIS — R41840 Attention and concentration deficit: Secondary | ICD-10-CM

## 2021-03-10 DIAGNOSIS — M6281 Muscle weakness (generalized): Secondary | ICD-10-CM

## 2021-03-10 NOTE — Therapy (Signed)
[facility] [address] [place], [place], [address] Phone: [phone]   Fax:  [phone]  Speech Language Pathology Treatment  Patient Details  Name: Amy Barry MRN: [id number] Date of Birth: 1971-03-11 Referring Provider (SLP): Alger Simons MD   Encounter Date: 03/10/2021   End of Session - 03/10/21 1059     Visit Number 3    Number of Visits 17    Date for SLP Re-Evaluation 05/03/21    SLP Start Time 0930    SLP Stop Time  1245    SLP Time Calculation (min) 45 min    Activity Tolerance Patient tolerated treatment well             Past Medical History:  Diagnosis Date   Anxiety    Arthralgia    Generalized weakness    bilateral upper and lower extremities,  walks with cane   GERD (gastroesophageal reflux disease)    Headache(784.0)    Heart murmur    per pt "since childhood, no problems"   History of kidney stones    Hypertension    followed pcp   IBS (irritable bowel syndrome)    Iron deficiency anemia    12-06-2017 per pt recently had IV iron infusion July 2019   Low vitamin D level    Osteoarthritis    pt unsure about this   PVC (premature ventricular contraction)    per pt had holter monitor   SLE (systemic lupus erythematosus) ([place])    rheumotologist-- dr syed   Stroke [facility]) 06/2020   Wears glasses     Past Surgical History:  Procedure Laterality Date   CERVICAL DISC ARTHROPLASTY N/A 10/23/2015   Procedure: Cervical Disc Arthroplasty Cervical six-seven;  Surgeon: Eustace Moore, MD;  Location: [facility];  Service: Neurosurgery;  Laterality: N/A;   D & C HYSTEROSCOPY W/ RESECTION ENDOMETRIAL POLYP  10-13-2003    dr rivard @[facility]    ESOPHAGOGASTRODUODENOSCOPY     for GERD   IR ANGIO INTRA EXTRACRAN SEL COM CAROTID INNOMINATE BILAT MOD SED  07/06/2020   IR ANGIO INTRA EXTRACRAN SEL INTERNAL CAROTID BILAT MOD SED  06/29/2020   IR ANGIO VERTEBRAL SEL VERTEBRAL BILAT MOD SED  06/29/2020    IR ANGIO VERTEBRAL SEL VERTEBRAL BILAT MOD SED  07/06/2020   LAPAROSCOPIC ABDOMINAL EXPLORATION  1996   dx ibs   OVARIAN CYST SURGERY  age 39   x2 cyst   RADIOLOGY WITH ANESTHESIA N/A 06/29/2020   Procedure: IR WITH ANESTHESIA;  Surgeon: Radiologist, Medication, MD;  Location: [facility];  Service: Radiology;  Laterality: N/A;   ROBOTIC ASSISTED TOTAL HYSTERECTOMY N/A 12/13/2017   Procedure: XI ROBOTIC ASSISTED TOTAL HYSTERECTOMYWITH BILATERAL SALPINGECTOMY;  Surgeon: Christophe Louis, MD;  Location[facility];  Service: Gynecology;  Laterality: N/A;   TUBAL LIGATION Bilateral 06-01-2002   dr Mancel Bale @[facility]    PPTL    There were no vitals filed for this visit.   Subjective Assessment - 03/10/21 0941     Subjective "we went out to eat"    Patient is accompained by: Family member   daughter, Amy Barry   Currently in Pain? Yes    Pain Score 6     Pain Location Shoulder    Pain Orientation Left    Pain Descriptors / Indicators Aching    Pain Type Acute pain    Pain Onset More than a month ago    Pain Frequency Constant  ADULT SLP TREATMENT - 03/10/21 0944       General Information   Behavior/Cognition Alert;Cooperative;Pleasant mood      Treatment Provided   Treatment provided Cognitive-Linquistic      Cognitive-Linquistic Treatment   Treatment focused on Cognition;Patient/family/caregiver education    Skilled Treatment Pt brought in binder, we organied binder and hole punched. She described how she pays bills and attention, memory, and organization generating list of monthly bills, company and rough amount. She generated 6 monthly bills, required min questioning cues and semantic cues to recall vs word find the companies.Design Memory subtest and maze subtest. She correctly recalled 4/6 designs, however 1 was greater than 10 seconds, so 3/6 were recalled timely. Simple maze was completed correctly in time, and completed complex maze in time, with 1 error. Will continue CLQT  subests as she is able. Instructed pt and daughter to have pt cross off days on her calendar at home, and have Amy Barry fill out the calendar as appointments are added. Amy Barry requied extended time and visual cues to ID today's date on therapy calendar and to ID her next appointment      Assessment / Recommendations / [place] with current plan of care      Progression Toward Goals   Progression toward goals Progressing toward goals              SLP Education - 03/10/21 1055     Education Details use calendar at home , complete bill shett    Person(s) Educated Patient;Child(ren)    Methods Explanation;Demonstration    Comprehension Verbalized understanding              SLP Short Term Goals - 03/10/21 1059       SLP SHORT TERM GOAL #1   Title Complete CLQT to determine extent of cognitive deficits.    Time 2    Period Weeks    Status On-going    Target Date 03/17/21      SLP SHORT TERM GOAL #2   Title Pt will manage her calendar and appointments with rare min A from family over 2 weeks    Time 4    Period Weeks    Status On-going      SLP SHORT TERM GOAL #3   Title Pt will sort pills into organizer independently with supervision cues from family    Time 4    Period Weeks    Status On-going      SLP SHORT TERM GOAL #4   Title Pt will pay 3 bills with occasional min A from family    Time 4    Period Weeks    Status On-going      SLP SHORT TERM GOAL #5   Title Pt will carryover 3 compensatory strategies to complete 2 IADL's with occasional min A from family    Time 4    Period Weeks    Status On-going              SLP Long Term Goals - 03/10/21 1059       SLP LONG TERM GOAL #1   Title Pt will independently manage her daily schedule, appointments, to do lists over 1 week    Time 8    Period Weeks    Status On-going      SLP LONG TERM GOAL #2   Title Pt will use compensations for organization and attention to pay 5 bills with rare min A  from  family    Time 8    Period Weeks    Status On-going      SLP LONG TERM GOAL #3   Title Pt will be independent in medication management with no A from family    Time 8    Period Weeks    Status On-going      SLP LONG TERM GOAL #4   Title Pt will improve score on NeuroQOL Cognitive Function PROM (initial score 39) by 4 points    Time 8    Period Weeks    Status On-going      SLP LONG TERM GOAL #5   Title Pt will carryover compensatory strategies for alternating attention in cooking and IADL's with rare min A from family    Time 8    Period Weeks    Status On-going              Plan - 03/10/21 1056     Clinical Impression Statement Slow processsing and reduced memory persist. Affect somewhat better today. No episodes of zoning out. Initated strategies for financial management and attention. Trained caregiver in [place] to use calendar and manage her appointments to facilitate independence in this.  Continuing CLQT subtests as anxiety permits. Continue skilled ST to maxmize safety, independence, and return to PLOF.    Speech Therapy Frequency 2x / week    Duration 8 weeks    Treatment/Interventions Compensatory strategies;Cueing hierarchy;Functional tasks;Patient/family education;Diet toleration management by SLP;Environmental controls;Cognitive reorganization;Multimodal communcation approach;Compensatory techniques;Internal/external aids;SLP instruction and feedback    Potential to [place]             Patient will benefit from skilled therapeutic intervention in order to improve the following deficits and impairments:   Cognitive communication deficit    Problem List Patient Active Problem List   Diagnosis Date Noted   Biceps tendonitis on left 02/10/2021   Vascular headache 08/05/2020   Hyponatremia 07/28/2020   Headache 07/24/2020   Neck pain, musculoskeletal 07/24/2020   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     Subarachnoid bleed ([place]) 07/09/2020   Subarachnoid hemorrhage ([place]) 06/29/2020   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[place]) 03/04/2019   S/P hysterectomy 12/13/2017   Abnormal laboratory test 09/09/2016   Gait abnormality 08/03/2016   Weakness 08/03/2016   Paresthesia 08/03/2016   S/P cervical spinal fusion 10/23/2015   Hypersomnia 12/08/2010   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   EPIGASTRIC PAIN 10/13/2009   IRRITABLE BOWEL SYNDROME 03/10/2009   BACK PAIN, LUMBAR 07/01/2008   COSTOCHONDRITIS 02/01/2008   VIRAL URI 01/08/2008   CHEST PAIN 01/08/2008   OTHER SPECIFIED EPISODIC MOOD DISORDER 12/25/2007   ARM PAIN 12/25/2007   ANEMIA-NOS 10/02/2007   Essential hypertension 10/02/2007   GERD 10/02/2007   POLYP, GALLBLADDER 10/02/2007   UTERINE POLYP 10/02/2007   OSTEOARTHRITIS 10/02/2007   Dyskinesia 10/02/2007   HEADACHE 10/02/2007   PALPITATIONS 10/02/2007   CARDIAC MURMUR, HX OF 10/02/2007   NEPHROLITHIASIS, HX OF 10/02/2007    Syrai Gladwin, Annye Rusk, CCC-SLP 03/10/2021, 11:00 AM  [facility] [address] [place], [place], [address] Phone: [phone]   Fax:  [phone]   Name: CASSIDEY BARRALES MRN: [id number] Date of Birth: [birth year]

## 2021-03-10 NOTE — Therapy (Signed)
[facility] [address] [place], [place], [address] Phone: [phone]   Fax:  [phone]  Occupational Therapy Treatment  Patient Details  Name: Amy Barry MRN: [id number] Date of Birth: 12-24-1970 Referring Provider (OT): Oval Linsey, MD   Encounter Date: 03/10/2021   OT End of Session - 03/10/21 1117     Visit Number 3    Number of Visits 17    Date for OT Re-Evaluation 05/12/21    Authorization Type [employer]    Authorization Time Period 120 visits PT/OT, 120 visits ST    Authorization - Number of Visits 43    OT Start Time 1015    OT Stop Time 1100    OT Time Calculation (min) 45 min    Activity Tolerance Patient tolerated treatment well    Behavior During Therapy WFL for tasks assessed/performed             Past Medical History:  Diagnosis Date   Anxiety    Arthralgia    Generalized weakness    bilateral upper and lower extremities,  walks with cane   GERD (gastroesophageal reflux disease)    Headache(784.0)    Heart murmur    per pt "since childhood, no problems"   History of kidney stones    Hypertension    followed pcp   IBS (irritable bowel syndrome)    Iron deficiency anemia    12-06-2017 per pt recently had IV iron infusion July 2019   Low vitamin D level    Osteoarthritis    pt unsure about this   PVC (premature ventricular contraction)    per pt had holter monitor   SLE (systemic lupus erythematosus) ([place])    rheumotologist-- dr syed   Stroke [facility]) 06/2020   Wears glasses     Past Surgical History:  Procedure Laterality Date   CERVICAL DISC ARTHROPLASTY N/A 10/23/2015   Procedure: Cervical Disc Arthroplasty Cervical six-seven;  Surgeon: Eustace Moore, MD;  Location: [facility];  Service: Neurosurgery;  Laterality: N/A;   D & C HYSTEROSCOPY W/ RESECTION ENDOMETRIAL POLYP  10-13-2003    dr rivard @[facility]    ESOPHAGOGASTRODUODENOSCOPY     for GERD   IR ANGIO INTRA EXTRACRAN SEL COM  CAROTID INNOMINATE BILAT MOD SED  07/06/2020   IR ANGIO INTRA EXTRACRAN SEL INTERNAL CAROTID BILAT MOD SED  06/29/2020   IR ANGIO VERTEBRAL SEL VERTEBRAL BILAT MOD SED  06/29/2020   IR ANGIO VERTEBRAL SEL VERTEBRAL BILAT MOD SED  07/06/2020   LAPAROSCOPIC ABDOMINAL EXPLORATION  1996   dx ibs   OVARIAN CYST SURGERY  age 4   x2 cyst   RADIOLOGY WITH ANESTHESIA N/A 06/29/2020   Procedure: IR WITH ANESTHESIA;  Surgeon: Radiologist, Medication, MD;  Location: [facility];  Service: Radiology;  Laterality: N/A;   ROBOTIC ASSISTED TOTAL HYSTERECTOMY N/A 12/13/2017   Procedure: XI ROBOTIC ASSISTED TOTAL HYSTERECTOMYWITH BILATERAL SALPINGECTOMY;  Surgeon: Christophe Louis, MD;  Location: [facility];  Service: Gynecology;  Laterality: N/A;   TUBAL LIGATION Bilateral 06-01-2002   dr Mancel Bale @[facility]    PPTL    There were no vitals filed for this visit.   Subjective Assessment - 03/10/21 1018     Subjective  It's a little better.  It was like an 8 yesterday - today it is like a 6.    Pertinent History PMH: HTN, IBS, anemia, connective tissue disorder, family history of cerebral aneurysms    Limitations Fall Risk, L shoulder pain, L  hemi    Pain Score 6     Pain Location Shoulder    Pain Descriptors / Indicators Aching;Guarding    Pain Type Acute pain    Pain Onset More than a month ago    Pain Frequency Constant                          OT Treatments/Exercises (OP) - 03/10/21 0001       Neurological Re-education Exercises   Other Exercises 1 Patient with pain in left shoulder girdle.  Patient with very strong pull from pects major/?minor.  Sent message to physiatrist regarding options for medication.  Patient had relief of pain consisitently when pects lengthened and scapula realigned.  Patient has incorrect muscle activation in left UE - worked on active relaxation during upright sitting - rotating body on stable arm.    Other Exercises 2 Seated exercise to address alignment of left shoulder girdle with  body rotating toward and away fdrom leftr.  Patient overactivates through head and upper body, and shows limited activation in lower trunk/pelvis.                      OT Short Term Goals - 03/10/21 1118       OT SHORT TERM GOAL #1   Title Pt will be independent with initial HEP for LUE    Time 4    Period Weeks    Status On-going    Target Date 03/31/21      OT SHORT TERM GOAL #2   Title Pt will perform UB dressing with pain no greater than 7/10 in LUE shoulder and with no physical assistance    Time 4    Period Weeks    Status On-going      OT SHORT TERM GOAL #3   Title Pt will achieve 90 degrees of AROM shoulder flexion with LUE in order to progress to using LUE for functional reaching activities    Baseline 75* AROM - limited by pain    Time 4    Period Weeks    Status On-going      OT SHORT TERM GOAL #4   Title Pt will increase grip strength in LUE by 5lbs or greater.    Baseline L 19.6, R 39.4    Time 4    Period Weeks    Status On-going      OT SHORT TERM GOAL #5   Title Pt will perform simple warm meal prep and/or light housekeeping with supervision and good safety awareness.    Time 4    Period Weeks    Status On-going               OT Long Term Goals - 03/10/21 1118       OT LONG TERM GOAL #1   Title Pt will be independent with updated HEP for LUE    Time 10    Period Weeks    Status On-going    Target Date 05/12/21      OT LONG TERM GOAL #2   Title Pt will report pain no greater than 6/10 while completing HEP    Time 10    Period Weeks    Status On-going      OT LONG TERM GOAL #3   Title Pt will increase fine motor coordination in LUE by completing 9 hole peg test in 25 seconds or less.    Baseline  R 21.53s, L 32.28s    Time 10    Period Weeks    Status On-going      OT LONG TERM GOAL #4   Title Pt will demonstrate ability to obtain a lightweight object at 110 degrees shoulder flexion or greater    Baseline 75    Time 10     Period Weeks    Status On-going      OT LONG TERM GOAL #5   Title Pt will increase grip strength in LUE to 30 lbs or greater for increased functional use.    Time 10    Period Weeks    Status On-going                   Plan - 03/10/21 1117     Clinical Impression Statement Pt tearful when shoulder pain relieved - patient fearful with all movement.  Does best with slow controlled movement.    OT Occupational Profile and History Detailed Assessment- Review of Records and additional review of physical, cognitive, psychosocial history related to current functional performance    Occupational performance deficits (Please refer to evaluation for details): ADL's;IADL's    Body Structure / Function / Physical Skills ADL;Decreased knowledge of use of DME;Strength;Dexterity;GMC;Pain;UE functional use;IADL;ROM;Vision;Flexibility;Sensation;FMC;Coordination    Cognitive Skills Attention;Problem Solve;Understand;Emotional    Rehab Potential Good    Clinical Decision Making Several treatment options, min-mod task modification necessary    Comorbidities Affecting Occupational Performance: May have comorbidities impacting occupational performance    Modification or Assistance to Complete Evaluation  Min-Moderate modification of tasks or assist with assess necessary to complete eval    OT Frequency 2x / week    OT Duration Other (comment)   16 visits over 10 weeks for any scheduling conflicts   OT Treatment/Interventions Self-care/ADL training;Moist Heat;Fluidtherapy;DME and/or AE instruction;Splinting;Therapeutic activities;Traction;Aquatic Therapy;Ultrasound;Therapeutic exercise;Cognitive remediation/compensation;Visual/perceptual remediation/compensation;Passive range of motion;Functional Mobility Training;Neuromuscular education;Electrical Stimulation;Energy conservation;Manual Therapy;Patient/family education    Plan self PROM/gentle ROM exercises for LUE to get shoulder moving for HEP, sleep  positions    OT Home Exercise Plan started with seated self PROM to floor - review at first session    Recommended Other Services seeing PT, ST    Consulted and Agree with Plan of Care Patient;Family member/caregiver    Family Member Consulted daughter, Debe Coder             Patient will benefit from skilled therapeutic intervention in order to improve the following deficits and impairments:   Body Structure / Function / Physical Skills: ADL, Decreased knowledge of use of DME, Strength, Dexterity, GMC, Pain, UE functional use, IADL, ROM, Vision, Flexibility, Sensation, FMC, Coordination Cognitive Skills: Attention, Problem Solve, Understand, Emotional     Visit Diagnosis: Stiffness of left shoulder, not elsewhere classified  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 (HCC)  Lack of coordination  Attention and concentration deficit  Frontal lobe and executive function deficit  Acute pain of left shoulder  Muscle weakness (generalized)    Problem List Patient Active Problem List   Diagnosis Date Noted   Biceps tendonitis on left 02/10/2021   Vascular headache 08/05/2020   Hyponatremia 07/28/2020   Headache 07/24/2020   Neck pain, musculoskeletal 07/24/2020   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    Subarachnoid bleed ([place]) 07/09/2020   Subarachnoid hemorrhage ([place]) 06/29/2020   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[place]) 03/04/2019   S/P hysterectomy 12/13/2017   Abnormal laboratory test 09/09/2016   Gait abnormality  08/03/2016   Weakness 08/03/2016   Paresthesia 08/03/2016   S/P cervical spinal fusion 10/23/2015   Hypersomnia 12/08/2010   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   EPIGASTRIC PAIN 10/13/2009   IRRITABLE BOWEL SYNDROME 03/10/2009   BACK PAIN, LUMBAR 07/01/2008   COSTOCHONDRITIS 02/01/2008   VIRAL URI 01/08/2008   CHEST PAIN 01/08/2008   OTHER SPECIFIED EPISODIC MOOD DISORDER 12/25/2007    ARM PAIN 12/25/2007   ANEMIA-NOS 10/02/2007   Essential hypertension 10/02/2007   GERD 10/02/2007   POLYP, GALLBLADDER 10/02/2007   UTERINE POLYP 10/02/2007   OSTEOARTHRITIS 10/02/2007   Dyskinesia 10/02/2007   HEADACHE 10/02/2007   PALPITATIONS 10/02/2007   CARDIAC MURMUR, HX OF 10/02/2007   NEPHROLITHIASIS, HX OF 10/02/2007    Mariah Milling, OT 03/10/2021, 11:19 AM  [facility] [address] [place], [place], [address] Phone: [phone]   Fax:  [phone]  Name: Amy Barry MRN: [id number] Date of Birth: [birth year]

## 2021-03-12 ENCOUNTER — Ambulatory Visit: Payer: No Typology Code available for payment source

## 2021-03-13 ENCOUNTER — Other Ambulatory Visit: Payer: Self-pay | Admitting: Physical Medicine & Rehabilitation

## 2021-03-18 ENCOUNTER — Ambulatory Visit: Payer: No Typology Code available for payment source | Admitting: Physical Therapy

## 2021-03-18 ENCOUNTER — Ambulatory Visit: Payer: No Typology Code available for payment source | Admitting: Occupational Therapy

## 2021-03-18 ENCOUNTER — Encounter: Payer: Self-pay | Admitting: Occupational Therapy

## 2021-03-18 ENCOUNTER — Other Ambulatory Visit: Payer: Self-pay

## 2021-03-18 DIAGNOSIS — M25612 Stiffness of left shoulder, not elsewhere classified: Secondary | ICD-10-CM

## 2021-03-18 DIAGNOSIS — R279 Unspecified lack of coordination: Secondary | ICD-10-CM

## 2021-03-18 DIAGNOSIS — I69054 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: Secondary | ICD-10-CM

## 2021-03-18 DIAGNOSIS — R41840 Attention and concentration deficit: Secondary | ICD-10-CM

## 2021-03-18 DIAGNOSIS — M6281 Muscle weakness (generalized): Secondary | ICD-10-CM

## 2021-03-18 DIAGNOSIS — R41844 Frontal lobe and executive function deficit: Secondary | ICD-10-CM

## 2021-03-18 DIAGNOSIS — M25512 Pain in left shoulder: Secondary | ICD-10-CM

## 2021-03-18 NOTE — Therapy (Signed)
[facility] [address] [place], [place], [address] Phone: [phone]   Fax:  [phone]  Occupational Therapy Treatment  Patient Details  Name: Amy Barry MRN: [id number] Date of Birth: 09-Apr-1970 Referring Provider (OT): Oval Linsey, MD   Encounter Date: 03/18/2021   OT End of Session - 03/18/21 1200     Visit Number 4    Number of Visits 17    Date for OT Re-Evaluation 05/12/21    Authorization Type Aet[employer]    Authorization Time Period 120 visits PT/OT, 120 visits ST    Authorization - Number of Visits 108    OT Start Time 1100    OT Stop Time 1145    OT Time Calculation (min) 45 min    Activity Tolerance Patient tolerated treatment well    Behavior During Therapy WFL for tasks assessed/performed             Past Medical History:  Diagnosis Date   Anxiety    Arthralgia    Generalized weakness    bilateral upper and lower extremities,  walks with cane   GERD (gastroesophageal reflux disease)    Headache(784.0)    Heart murmur    per pt "since childhood, no problems"   History of kidney stones    Hypertension    followed pcp   IBS (irritable bowel syndrome)    Iron deficiency anemia    12-06-2017 per pt recently had IV iron infusion July 2019   Low vitamin D level    Osteoarthritis    pt unsure about this   PVC (premature ventricular contraction)    per pt had holter monitor   SLE (systemic lupus erythematosus) ([place])    rheumotologist-- dr syed   Stroke [facility]) 06/2020   Wears glasses     Past Surgical History:  Procedure Laterality Date   CERVICAL DISC ARTHROPLASTY N/A 10/23/2015   Procedure: Cervical Disc Arthroplasty Cervical six-seven;  Surgeon: Eustace Moore, MD;  Location: [facility];  Service: Neurosurgery;  Laterality: N/A;   D & C HYSTEROSCOPY W/ RESECTION ENDOMETRIAL POLYP  10-13-2003    dr rivard @[facility]    ESOPHAGOGASTRODUODENOSCOPY     for GERD   IR ANGIO INTRA EXTRACRAN SEL COM  CAROTID INNOMINATE BILAT MOD SED  07/06/2020   IR ANGIO INTRA EXTRACRAN SEL INTERNAL CAROTID BILAT MOD SED  06/29/2020   IR ANGIO VERTEBRAL SEL VERTEBRAL BILAT MOD SED  06/29/2020   IR ANGIO VERTEBRAL SEL VERTEBRAL BILAT MOD SED  07/06/2020   LAPAROSCOPIC ABDOMINAL EXPLORATION  1996   dx ibs   OVARIAN CYST SURGERY  age 14   x2 cyst   RADIOLOGY WITH ANESTHESIA N/A 06/29/2020   Procedure: IR WITH ANESTHESIA;  Surgeon: Radiologist, Medication, MD;  Location: [facility];  Service: Radiology;  Laterality: N/A;   ROBOTIC ASSISTED TOTAL HYSTERECTOMY N/A 12/13/2017   Procedure: XI ROBOTIC ASSISTED TOTAL HYSTERECTOMYWITH BILATERAL SALPINGECTOMY;  Surgeon: Christophe Louis, MD;  Location: [facility];  Service: Gynecology;  Laterality: N/A;   TUBAL LIGATION Bilateral 06-01-2002   dr Mancel Bale @[facility]    PPTL    There were no vitals filed for this visit.   Subjective Assessment - 03/18/21 1113     Subjective  I was sporting a 10 - its more like a 6 or 7 now    Patient is accompanied by: Family member    Pertinent History PMH: HTN, IBS, anemia, connective tissue disorder, family history of cerebral aneurysms    Limitations Fall  Risk, L shoulder pain, L hemi    Currently in Pain? Yes    Pain Score 6     Pain Location Shoulder    Pain Orientation Left    Pain Descriptors / Indicators Aching    Pain Type Chronic pain    Pain Onset More than a month ago    Pain Frequency Constant    Aggravating Factors  Sleeping - wakes her - needs to reposition    Pain Relieving Factors heat, tylenol                          OT Treatments/Exercises (OP) - 03/18/21 0001       Neurological Re-education Exercises   Other Exercises 1 Neuromuscular reeducation to address left shoulder pain.  Working to reduce tension in proximal shoulder neck and upper back.  Supine rolling slightly toward and away from left arm.  Worked on incorporating controlled exhalation to assist with relaxing tension in neck, chest, shoulder.  Seated  working on trunk rotation as a whole toward left side.  Patient reports less pain at end of session.                    OT Education - 03/18/21 1159     Education Details aquatic therapy    Person(s) Educated Patient;Child(ren)    Methods Explanation;Handout;Verbal cues    Comprehension Verbalized understanding              OT Short Term Goals - 03/18/21 1201       OT SHORT TERM GOAL #1   Title Pt will be independent with initial HEP for LUE    Time 4    Period Weeks    Status On-going    Target Date 03/31/21      OT SHORT TERM GOAL #2   Title Pt will perform UB dressing with pain no greater than 7/10 in LUE shoulder and with no physical assistance    Time 4    Period Weeks    Status On-going      OT SHORT TERM GOAL #3   Title Pt will achieve 90 degrees of AROM shoulder flexion with LUE in order to progress to using LUE for functional reaching activities    Baseline 75* AROM - limited by pain    Time 4    Period Weeks    Status On-going      OT SHORT TERM GOAL #4   Title Pt will increase grip strength in LUE by 5lbs or greater.    Baseline L 19.6, R 39.4    Time 4    Period Weeks    Status On-going      OT SHORT TERM GOAL #5   Title Pt will perform simple warm meal prep and/or light housekeeping with supervision and good safety awareness.    Time 4    Period Weeks    Status On-going               OT Long Term Goals - 03/18/21 1201       OT LONG TERM GOAL #1   Title Pt will be independent with updated HEP for LUE    Time 10    Period Weeks    Status On-going    Target Date 05/12/21      OT LONG TERM GOAL #2   Title Pt will report pain no greater than 6/10 while completing HEP  Time 10    Period Weeks    Status On-going      OT LONG TERM GOAL #3   Title Pt will increase fine motor coordination in LUE by completing 9 hole peg test in 25 seconds or less.    Baseline R 21.53s, L 32.28s    Time 10    Period Weeks    Status  On-going      OT LONG TERM GOAL #4   Title Pt will demonstrate ability to obtain a lightweight object at 110 degrees shoulder flexion or greater    Baseline 75    Time 10    Period Weeks    Status On-going      OT LONG TERM GOAL #5   Title Pt will increase grip strength in LUE to 30 lbs or greater for increased functional use.    Time 10    Period Weeks    Status On-going                   Plan - 03/18/21 1200     Clinical Impression Statement Pt reports slow improvement with shoulder pain.  Patient remains extremely anxious, yet responds well to slow therapeutic technique    OT Occupational Profile and History Detailed Assessment- Review of Records and additional review of physical, cognitive, psychosocial history related to current functional performance    Occupational performance deficits (Please refer to evaluation for details): ADL's;IADL's    Body Structure / Function / Physical Skills ADL;Decreased knowledge of use of DME;Strength;Dexterity;GMC;Pain;UE functional use;IADL;ROM;Vision;Flexibility;Sensation;FMC;Coordination    Cognitive Skills Attention;Problem Solve;Understand;Emotional    Rehab Potential Good    Clinical Decision Making Several treatment options, min-mod task modification necessary    Comorbidities Affecting Occupational Performance: May have comorbidities impacting occupational performance    Modification or Assistance to Complete Evaluation  Min-Moderate modification of tasks or assist with assess necessary to complete eval    OT Frequency 2x / week    OT Duration Other (comment)   16 visits over 10 weeks for any scheduling conflicts   OT Treatment/Interventions Self-care/ADL training;Moist Heat;Fluidtherapy;DME and/or AE instruction;Splinting;Therapeutic activities;Traction;Aquatic Therapy;Ultrasound;Therapeutic exercise;Cognitive remediation/compensation;Visual/perceptual remediation/compensation;Passive range of motion;Functional Mobility  Training;Neuromuscular education;Electrical Stimulation;Energy conservation;Manual Therapy;Patient/family education    Plan self PROM/gentle ROM exercises for LUE to get shoulder moving for HEP, sleep positions    OT Home Exercise Plan aquatics for balance and shoulder pain, started with seated self PROM to floor - review at first session    Recommended Other Services seeing PT, ST    Consulted and Agree with Plan of Care Patient;Family member/caregiver    Family Member Consulted daughter, Debe Coder             Patient will benefit from skilled therapeutic intervention in order to improve the following deficits and impairments:   Body Structure / Function / Physical Skills: ADL, Decreased knowledge of use of DME, Strength, Dexterity, GMC, Pain, UE functional use, IADL, ROM, Vision, Flexibility, Sensation, FMC, Coordination Cognitive Skills: Attention, Problem Solve, Understand, Emotional     Visit Diagnosis: 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 (HCC)  Stiffness of left shoulder, not elsewhere classified  Lack of coordination  Attention and concentration deficit  Frontal lobe and executive function deficit  Acute pain of left shoulder  Muscle weakness (generalized)    Problem List Patient Active Problem List   Diagnosis Date Noted   Biceps tendonitis on left 02/10/2021   Vascular headache 08/05/2020   Hyponatremia 07/28/2020  Headache 07/24/2020   Neck pain, musculoskeletal 07/24/2020   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    Subarachnoid bleed ([place]) 07/09/2020   Subarachnoid hemorrhage ([place]) 06/29/2020   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[place]) 03/04/2019   S/P hysterectomy 12/13/2017   Abnormal laboratory test 09/09/2016   Gait abnormality 08/03/2016   Weakness 08/03/2016   Paresthesia 08/03/2016   S/P cervical spinal fusion 10/23/2015   Hypersomnia 12/08/2010   ANGIONEUROTIC EDEMA  01/20/2010   EPIGASTRIC PAIN 10/13/2009   IRRITABLE BOWEL SYNDROME 03/10/2009   BACK PAIN, LUMBAR 07/01/2008   COSTOCHONDRITIS 02/01/2008   VIRAL URI 01/08/2008   CHEST PAIN 01/08/2008   OTHER SPECIFIED EPISODIC MOOD DISORDER 12/25/2007   ARM PAIN 12/25/2007   ANEMIA-NOS 10/02/2007   Essential hypertension 10/02/2007   GERD 10/02/2007   POLYP, GALLBLADDER 10/02/2007   UTERINE POLYP 10/02/2007   OSTEOARTHRITIS 10/02/2007   Dyskinesia 10/02/2007   HEADACHE 10/02/2007   PALPITATIONS 10/02/2007   CARDIAC MURMUR, HX OF 10/02/2007   NEPHROLITHIASIS, HX OF 10/02/2007    Amy Barry, OT 03/18/2021, 12:03 PM  [facility] [address] [place], [place], [address] Phone: [phone]   Fax:  [phone]  Name: Amy Barry MRN: [id number] Date of Birth: [birth year]

## 2021-03-18 NOTE — Patient Instructions (Signed)
° °  Aquatic Therapy: What to Expect!  Where:  [facility] [address] [place], [place] [address] [phone]           How to Prepare: Please make sure you drink 8 ounces of water about one hour prior to your pool session A caregiver must attend the entire session with the patient (unless your primary therapists feels this is not necessary). The caregiver will be responsible for assisting with dressing as well as any toileting needs.  Please arrive IN YOUR SUIT and a few minutes prior to your appointment - this helps to avoid delays in starting your session. Please make sure to attend to any toileting needs prior to entering the pool Once on the pool deck your therapist will ask you to sign the Patient  Consent and Assignment of Benefits form Your therapist may take your blood pressure prior to, during and after your session if indicated We usually try and create a home exercise program based on activities we do in the pool.  Please be thinking about who might be able to assist you in the pool should you want to participate in an aquatic home exercise program at the time of discharge.  Some patients do not want to or do not have the ability to participate in an aquatic home program - this is not a barrier in any way to you participating in aquatic therapy as part of your current therapy plan!    About the pool: Entering the pool Your therapist will assist you; there are multiple ways to enter including stairs with railings, a walk in ramp, a roll in chair and a mechanical lift. Your therapist will determine the most appropriate way for you. Water temperature is usually between 86-87 degrees There may be other swimmers in the pool at the same time     Contact Info:             Appointments: [facility]         All sessions are 45 minutes   [address]  [address]            Please call the [facility]  if   [place], [place]  [address]           you need to cancel or reschedule an appointment.  [phone]           Suzanne Dilday, PT    Kris Recia Sons, OTR/L    09/25/19  

## 2021-03-19 ENCOUNTER — Ambulatory Visit: Payer: No Typology Code available for payment source | Admitting: Occupational Therapy

## 2021-03-19 ENCOUNTER — Ambulatory Visit: Payer: No Typology Code available for payment source

## 2021-03-22 ENCOUNTER — Ambulatory Visit: Payer: No Typology Code available for payment source | Admitting: Occupational Therapy

## 2021-03-22 ENCOUNTER — Encounter: Payer: Self-pay | Admitting: Occupational Therapy

## 2021-03-22 ENCOUNTER — Other Ambulatory Visit: Payer: Self-pay | Admitting: Physical Medicine & Rehabilitation

## 2021-03-22 ENCOUNTER — Other Ambulatory Visit: Payer: Self-pay

## 2021-03-22 DIAGNOSIS — R279 Unspecified lack of coordination: Secondary | ICD-10-CM

## 2021-03-22 DIAGNOSIS — M25512 Pain in left shoulder: Secondary | ICD-10-CM

## 2021-03-22 DIAGNOSIS — M6281 Muscle weakness (generalized): Secondary | ICD-10-CM

## 2021-03-22 DIAGNOSIS — R41844 Frontal lobe and executive function deficit: Secondary | ICD-10-CM

## 2021-03-22 DIAGNOSIS — M25612 Stiffness of left shoulder, not elsewhere classified: Secondary | ICD-10-CM

## 2021-03-22 DIAGNOSIS — I69054 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: Secondary | ICD-10-CM

## 2021-03-22 DIAGNOSIS — R41840 Attention and concentration deficit: Secondary | ICD-10-CM

## 2021-03-22 NOTE — Telephone Encounter (Signed)
Pharmacy is sending a request for trazodone. Would you like to refill?

## 2021-03-22 NOTE — Therapy (Signed)
[facility] [address] [place], [place], [address] Phone: [phone]   Fax:  [phone]  Occupational Therapy Treatment  Patient Details  Name: Amy Barry MRN: [id number] Date of Birth: [birth year] Referring Provider (OT): Oval Linsey, MD   Encounter Date: 03/22/2021   OT End of Session - 03/22/21 1842     Visit Number 5    Number of Visits 17    Date for OT Re-Evaluation 05/12/21    Authorization Type [employer]    Authorization Time Period 120 visits PT/OT, 120 visits ST    Authorization - Number of Visits 38    OT Start Time 1405    OT Stop Time 1500    OT Time Calculation (min) 55 min    Activity Tolerance Patient tolerated treatment well    Behavior During Therapy WFL for tasks assessed/performed             Past Medical History:  Diagnosis Date   Anxiety    Arthralgia    Generalized weakness    bilateral upper and lower extremities,  walks with cane   GERD (gastroesophageal reflux disease)    Headache(784.0)    Heart murmur    per pt "since childhood, no problems"   History of kidney stones    Hypertension    followed pcp   IBS (irritable bowel syndrome)    Iron deficiency anemia    12-06-2017 per pt recently had IV iron infusion July 2019   Low vitamin D level    Osteoarthritis    pt unsure about this   PVC (premature ventricular contraction)    per pt had holter monitor   SLE (systemic lupus erythematosus) ([place])    rheumotologist-- dr syed   Stroke [facility]) 06/2020   Wears glasses     Past Surgical History:  Procedure Laterality Date   CERVICAL DISC ARTHROPLASTY N/A 10/23/2015   Procedure: Cervical Disc Arthroplasty Cervical six-seven;  Surgeon: Eustace Moore, MD;  Location: [facility];  Service: Neurosurgery;  Laterality: N/A;   D & C HYSTEROSCOPY W/ RESECTION ENDOMETRIAL POLYP  10-13-2003    dr rivard @[facility]    ESOPHAGOGASTRODUODENOSCOPY     for GERD   IR ANGIO INTRA EXTRACRAN SEL COM  CAROTID INNOMINATE BILAT MOD SED  07/06/2020   IR ANGIO INTRA EXTRACRAN SEL INTERNAL CAROTID BILAT MOD SED  06/29/2020   IR ANGIO VERTEBRAL SEL VERTEBRAL BILAT MOD SED  06/29/2020   IR ANGIO VERTEBRAL SEL VERTEBRAL BILAT MOD SED  07/06/2020   LAPAROSCOPIC ABDOMINAL EXPLORATION  1996   dx ibs   OVARIAN CYST SURGERY  age 65   x2 cyst   RADIOLOGY WITH ANESTHESIA N/A 06/29/2020   Procedure: IR WITH ANESTHESIA;  Surgeon: Radiologist, Medication, MD;  Location: [facility];  Service: Radiology;  Laterality: N/A;   ROBOTIC ASSISTED TOTAL HYSTERECTOMY N/A 12/13/2017   Procedure: XI ROBOTIC ASSISTED TOTAL HYSTERECTOMYWITH BILATERAL SALPINGECTOMY;  Surgeon: Christophe Louis, MD;  Location: [facility];  Service: Gynecology;  Laterality: N/A;   TUBAL LIGATION Bilateral 06-01-2002   dr Mancel Bale @[facility]    PPTL    There were no vitals filed for this visit.   Subjective Assessment - 03/22/21 1836     Subjective  Patient indicates it has been 4 years since she was last in a pool. Patient reports - I think I was doing my exercises in my sleep, and I was doing them all wrong!    Patient is accompanied by: Family member  Pertinent History PMH: HTN, IBS, anemia, connective tissue disorder, family history of cerebral aneurysms    Limitations Fall Risk, L shoulder pain, L hemi    Currently in Pain? Yes    Pain Score 6     Pain Location Shoulder    Pain Orientation Left    Pain Descriptors / Indicators Aching    Pain Type Chronic pain    Pain Onset More than a month ago    Pain Frequency Constant    Aggravating Factors  poor alignment, overuse    Pain Relieving Factors heat, tylenol            Patient seen for aquatic therapy visit today.   Patient required min assist to walk on pool deck, and to enter and exit pool via stairs and two railings.  Patient needing cueing to not overuse upper extremities with all walking tasks.  Session occurred in 3.5 ft of water, patient unstable to go into deeper water at this time.   Patient  needing mod to max assist for walking - worked on static standing to balance over feet.  Patient with strong impulse to lose balance backward.  Difficulty walking with water [place] and mod assist.  Patient limited by fear and anxiety.  Worked at slow pace with emphasis on relaxing breathing, and shoulder/neck muscles.   Worked on allowing arms to float to surface versus lifting.  When patient able to grasp this concept - showed improved range of motion without pain.                          OT Short Term Goals - 03/22/21 1844       OT SHORT TERM GOAL #1   Title Pt will be independent with initial HEP for LUE    Time 4    Period Weeks    Status On-going    Target Date 03/31/21      OT SHORT TERM GOAL #2   Title Pt will perform UB dressing with pain no greater than 7/10 in LUE shoulder and with no physical assistance    Time 4    Period Weeks    Status On-going      OT SHORT TERM GOAL #3   Title Pt will achieve 90 degrees of AROM shoulder flexion with LUE in order to progress to using LUE for functional reaching activities    Baseline 75* AROM - limited by pain    Time 4    Period Weeks    Status On-going      OT SHORT TERM GOAL #4   Title Pt will increase grip strength in LUE by 5lbs or greater.    Baseline L 19.6, R 39.4    Time 4    Period Weeks    Status On-going      OT SHORT TERM GOAL #5   Title Pt will perform simple warm meal prep and/or light housekeeping with supervision and good safety awareness.    Time 4    Period Weeks    Status On-going               OT Long Term Goals - 03/22/21 1844       OT LONG TERM GOAL #1   Title Pt will be independent with updated HEP for LUE    Time 10    Period Weeks    Status On-going    Target Date 05/12/21  OT LONG TERM GOAL #2   Title Pt will report pain no greater than 6/10 while completing HEP    Time 10    Period Weeks    Status On-going      OT LONG TERM GOAL #3   Title Pt will  increase fine motor coordination in LUE by completing 9 hole peg test in 25 seconds or less.    Baseline R 21.53s, L 32.28s    Time 10    Period Weeks    Status On-going      OT LONG TERM GOAL #4   Title Pt will demonstrate ability to obtain a lightweight object at 110 degrees shoulder flexion or greater    Baseline 75    Time 10    Period Weeks    Status On-going      OT LONG TERM GOAL #5   Title Pt will increase grip strength in LUE to 30 lbs or greater for increased functional use.    Time 10    Period Weeks    Status On-going                   Plan - 03/22/21 1843     Clinical Impression Statement Patient continues to show significant shoulder pain. Patient continues to show significant balance deficits.    OT Occupational Profile and History Detailed Assessment- Review of Records and additional review of physical, cognitive, psychosocial history related to current functional performance    Occupational performance deficits (Please refer to evaluation for details): ADL's;IADL's    Body Structure / Function / Physical Skills ADL;Decreased knowledge of use of DME;Strength;Dexterity;GMC;Pain;UE functional use;IADL;ROM;Vision;Flexibility;Sensation;FMC;Coordination    Cognitive Skills Attention;Problem Solve;Understand;Emotional    Rehab Potential Good    Clinical Decision Making Several treatment options, min-mod task modification necessary    Comorbidities Affecting Occupational Performance: May have comorbidities impacting occupational performance    Modification or Assistance to Complete Evaluation  Min-Moderate modification of tasks or assist with assess necessary to complete eval    OT Frequency 2x / week    OT Duration Other (comment)   16 visits over 10 weeks for any scheduling conflicts   OT Treatment/Interventions Self-care/ADL training;Moist Heat;Fluidtherapy;DME and/or AE instruction;Splinting;Therapeutic activities;Traction;Aquatic Therapy;Ultrasound;Therapeutic  exercise;Cognitive remediation/compensation;Visual/perceptual remediation/compensation;Passive range of motion;Functional Mobility Training;Neuromuscular education;Electrical Stimulation;Energy conservation;Manual Therapy;Patient/family education    Plan self PROM/gentle ROM exercises for LUE to get shoulder moving for HEP, sleep positions    OT Home Exercise Plan aquatics for balance and shoulder pain, started with seated self PROM to floor - review at first session    Recommended Other Services seeing PT, ST    Consulted and Agree with Plan of Care Patient;Family member/caregiver    Family Member Consulted daughter, Debe Coder             Patient will benefit from skilled therapeutic intervention in order to improve the following deficits and impairments:   Body Structure / Function / Physical Skills: ADL, Decreased knowledge of use of DME, Strength, Dexterity, GMC, Pain, UE functional use, IADL, ROM, Vision, Flexibility, Sensation, FMC, Coordination Cognitive Skills: Attention, Problem Solve, Understand, Emotional     Visit Diagnosis: 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 (HCC)  Stiffness of left shoulder, not elsewhere classified  Lack of coordination  Attention and concentration deficit  Frontal lobe and executive function deficit  Acute pain of left shoulder  Muscle weakness (generalized)    Problem List Patient Active Problem List   Diagnosis Date Noted  Biceps tendonitis on left 02/10/2021   Vascular headache 08/05/2020   Hyponatremia 07/28/2020   Headache 07/24/2020   Neck pain, musculoskeletal 07/24/2020   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    Subarachnoid bleed ([place]) 07/09/2020   Subarachnoid hemorrhage ([place]) 06/29/2020   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[place]) 03/04/2019   S/P hysterectomy 12/13/2017   Abnormal laboratory test 09/09/2016   Gait abnormality 08/03/2016   Weakness  08/03/2016   Paresthesia 08/03/2016   S/P cervical spinal fusion 10/23/2015   Hypersomnia 12/08/2010   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   EPIGASTRIC PAIN 10/13/2009   IRRITABLE BOWEL SYNDROME 03/10/2009   BACK PAIN, LUMBAR 07/01/2008   COSTOCHONDRITIS 02/01/2008   VIRAL URI 01/08/2008   CHEST PAIN 01/08/2008   OTHER SPECIFIED EPISODIC MOOD DISORDER 12/25/2007   ARM PAIN 12/25/2007   ANEMIA-NOS 10/02/2007   Essential hypertension 10/02/2007   GERD 10/02/2007   POLYP, GALLBLADDER 10/02/2007   UTERINE POLYP 10/02/2007   OSTEOARTHRITIS 10/02/2007   Dyskinesia 10/02/2007   HEADACHE 10/02/2007   PALPITATIONS 10/02/2007   CARDIAC MURMUR, HX OF 10/02/2007   NEPHROLITHIASIS, HX OF 10/02/2007    Amy Barry, OT 03/22/2021, 6:45 PM  [facility] [address] [place], [place], [address] Phone: [phone]   Fax:  [phone]  Name: Amy Barry MRN: [id number] Date of Birth: Aug 08, 1970

## 2021-03-24 ENCOUNTER — Ambulatory Visit: Payer: No Typology Code available for payment source

## 2021-03-24 ENCOUNTER — Encounter: Payer: Self-pay | Admitting: Occupational Therapy

## 2021-03-24 ENCOUNTER — Other Ambulatory Visit: Payer: Self-pay

## 2021-03-24 ENCOUNTER — Ambulatory Visit: Payer: No Typology Code available for payment source | Admitting: Occupational Therapy

## 2021-03-24 DIAGNOSIS — M25512 Pain in left shoulder: Secondary | ICD-10-CM

## 2021-03-24 DIAGNOSIS — R279 Unspecified lack of coordination: Secondary | ICD-10-CM

## 2021-03-24 DIAGNOSIS — M6281 Muscle weakness (generalized): Secondary | ICD-10-CM

## 2021-03-24 DIAGNOSIS — M25612 Stiffness of left shoulder, not elsewhere classified: Secondary | ICD-10-CM

## 2021-03-24 DIAGNOSIS — R41844 Frontal lobe and executive function deficit: Secondary | ICD-10-CM

## 2021-03-24 DIAGNOSIS — R41840 Attention and concentration deficit: Secondary | ICD-10-CM

## 2021-03-24 DIAGNOSIS — I69054 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: Secondary | ICD-10-CM

## 2021-03-24 NOTE — Therapy (Signed)
[facility] [address] [place], [place], [address] Phone: [phone]   Fax:  [phone]  Occupational Therapy Treatment  Patient Details  Name: Amy Barry MRN: [id number] Date of Birth: 14-Dec-1970 Referring Provider (OT): Oval Linsey, MD   Encounter Date: 03/24/2021   OT End of Session - 03/24/21 1351     Visit Number 6    Number of Visits 17    Date for OT Re-Evaluation 05/12/21    Authorization Type Aet[employer]    Authorization Time Period 120 visits PT/OT, 120 visits ST    Authorization - Number of Visits 77    OT Start Time 1230    OT Stop Time 1315    OT Time Calculation (min) 45 min    Activity Tolerance Patient tolerated treatment well    Behavior During Therapy WFL for tasks assessed/performed             Past Medical History:  Diagnosis Date   Anxiety    Arthralgia    Generalized weakness    bilateral upper and lower extremities,  walks with cane   GERD (gastroesophageal reflux disease)    Headache(784.0)    Heart murmur    per pt "since childhood, no problems"   History of kidney stones    Hypertension    followed pcp   IBS (irritable bowel syndrome)    Iron deficiency anemia    12-06-2017 per pt recently had IV iron infusion July 2019   Low vitamin D level    Osteoarthritis    pt unsure about this   PVC (premature ventricular contraction)    per pt had holter monitor   SLE (systemic lupus erythematosus) ([place])    rheumotologist-- dr syed   Stroke [facility]) 06/2020   Wears glasses     Past Surgical History:  Procedure Laterality Date   CERVICAL DISC ARTHROPLASTY N/A 10/23/2015   Procedure: Cervical Disc Arthroplasty Cervical six-seven;  Surgeon: Eustace Moore, MD;  [facility];  Service: Neurosurgery;  Laterality: N/A;   D & C HYSTEROSCOPY W/ RESECTION ENDOMETRIAL POLYP  10-13-2003    dr rivard @[facility]    ESOPHAGOGASTRODUODENOSCOPY     for GERD   IR ANGIO INTRA EXTRACRAN SEL COM  CAROTID INNOMINATE BILAT MOD SED  07/06/2020   IR ANGIO INTRA EXTRACRAN SEL INTERNAL CAROTID BILAT MOD SED  06/29/2020   IR ANGIO VERTEBRAL SEL VERTEBRAL BILAT MOD SED  06/29/2020   IR ANGIO VERTEBRAL SEL VERTEBRAL BILAT MOD SED  07/06/2020   LAPAROSCOPIC ABDOMINAL EXPLORATION  1996   dx ibs   OVARIAN CYST SURGERY  age 50   x2 cyst   RADIOLOGY WITH ANESTHESIA N/A 06/29/2020   Procedure: IR WITH ANESTHESIA;  Surgeon: Radiologist, Medication, MD;  Location: [facility];  Service: Radiology;  Laterality: N/A;   ROBOTIC ASSISTED TOTAL HYSTERECTOMY N/A 12/13/2017   Procedure: XI ROBOTIC ASSISTED TOTAL HYSTERECTOMYWITH BILATERAL SALPINGECTOMY;  Surgeon: Christophe Louis, MD;  Location[facility];  Service: Gynecology;  Laterality: N/A;   TUBAL LIGATION Bilateral 06-01-2002   dr Mancel Bale @[facility]    PPTL    There were no vitals filed for this visit.   Subjective Assessment - 03/24/21 1237     Subjective  It's hard to walk in that pool - I could tell I had a workout the next day    Patient is accompanied by: Family member    Pertinent History PMH: HTN, IBS, anemia, connective tissue disorder, family history of cerebral aneurysms  Limitations Fall Risk, L shoulder pain, L hemi    Currently in Pain? Yes    Pain Score 8     Pain Location Arm    Pain Orientation Left    Pain Descriptors / Indicators Aching    Pain Type Chronic pain    Pain Onset More than a month ago    Pain Frequency Constant                          OT Treatments/Exercises (OP) - 03/24/21 1345       Neurological Re-education Exercises   Other Exercises 1 rolling gently to left and right side for increasing weight bearing tolerance into LUE and gentle ROM. Pt with poor tolerance of activity.    Other Exercises 2 Pt in side lying on left side with arm extended on wedge for gravity eliminated positioning - pt with gentle shoulder flexion/extension and protraction/retraction with LUe on wedge with moderate report of pain in LUE. Pt  encouraged to take deep breaths for pain management and reducing tension and guarding of LUE incresing pain.                      OT Short Term Goals - 03/22/21 1844       OT SHORT TERM GOAL #1   Title Pt will be independent with initial HEP for LUE    Time 4    Period Weeks    Status On-going    Target Date 03/31/21      OT SHORT TERM GOAL #2   Title Pt will perform UB dressing with pain no greater than 7/10 in LUE shoulder and with no physical assistance    Time 4    Period Weeks    Status On-going      OT SHORT TERM GOAL #3   Title Pt will achieve 90 degrees of AROM shoulder flexion with LUE in order to progress to using LUE for functional reaching activities    Baseline 75* AROM - limited by pain    Time 4    Period Weeks    Status On-going      OT SHORT TERM GOAL #4   Title Pt will increase grip strength in LUE by 5lbs or greater.    Baseline L 19.6, R 39.4    Time 4    Period Weeks    Status On-going      OT SHORT TERM GOAL #5   Title Pt will perform simple warm meal prep and/or light housekeeping with supervision and good safety awareness.    Time 4    Period Weeks    Status On-going               OT Long Term Goals - 03/22/21 1844       OT LONG TERM GOAL #1   Title Pt will be independent with updated HEP for LUE    Time 10    Period Weeks    Status On-going    Target Date 05/12/21      OT LONG TERM GOAL #2   Title Pt will report pain no greater than 6/10 while completing HEP    Time 10    Period Weeks    Status On-going      OT LONG TERM GOAL #3   Title Pt will increase fine motor coordination in LUE by completing 9 hole peg test in 25 seconds or less.  Baseline R 21.53s, L 32.28s    Time 10    Period Weeks    Status On-going      OT LONG TERM GOAL #4   Title Pt will demonstrate ability to obtain a lightweight object at 110 degrees shoulder flexion or greater    Baseline 75    Time 10    Period Weeks    Status On-going       OT LONG TERM GOAL #5   Title Pt will increase grip strength in LUE to 30 lbs or greater for increased functional use.    Time 10    Period Weeks    Status On-going                   Plan - 03/24/21 1353     Clinical Impression Statement Pt continues with significant shoulder pain and guarding at LUE. Poor tolerace of gentle ROM exercises.    OT Occupational Profile and History Detailed Assessment- Review of Records and additional review of physical, cognitive, psychosocial history related to current functional performance    Occupational performance deficits (Please refer to evaluation for details): ADL's;IADL's    Body Structure / Function / Physical Skills ADL;Decreased knowledge of use of DME;Strength;Dexterity;GMC;Pain;UE functional use;IADL;ROM;Vision;Flexibility;Sensation;FMC;Coordination    Cognitive Skills Attention;Problem Solve;Understand;Emotional    Rehab Potential Good    Clinical Decision Making Several treatment options, min-mod task modification necessary    Comorbidities Affecting Occupational Performance: May have comorbidities impacting occupational performance    Modification or Assistance to Complete Evaluation  Min-Moderate modification of tasks or assist with assess necessary to complete eval    OT Frequency 2x / week    OT Duration Other (comment)   16 visits over 10 weeks for any scheduling conflicts   OT Treatment/Interventions Self-care/ADL training;Moist Heat;Fluidtherapy;DME and/or AE instruction;Splinting;Therapeutic activities;Traction;Aquatic Therapy;Ultrasound;Therapeutic exercise;Cognitive remediation/compensation;Visual/perceptual remediation/compensation;Passive range of motion;Functional Mobility Training;Neuromuscular education;Electrical Stimulation;Energy conservation;Manual Therapy;Patient/family education    Plan self PROM/gentle ROM exercises for LUE to get shoulder moving for HEP, sleep positions    OT Home Exercise Plan aquatics for  balance and shoulder pain, started with seated self PROM to floor - review at first session    Recommended Other Services seeing PT, ST    Consulted and Agree with Plan of Care Patient;Family member/caregiver    Family Member Consulted daughter, Debe Coder             Patient will benefit from skilled therapeutic intervention in order to improve the following deficits and impairments:   Body Structure / Function / Physical Skills: ADL, Decreased knowledge of use of DME, Strength, Dexterity, GMC, Pain, UE functional use, IADL, ROM, Vision, Flexibility, Sensation, FMC, Coordination Cognitive Skills: Attention, Problem Solve, Understand, Emotional     Visit Diagnosis: 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 (HCC)  Stiffness of left shoulder, not elsewhere classified  Lack of coordination  Attention and concentration deficit  Frontal lobe and executive function deficit  Acute pain of left shoulder  Muscle weakness (generalized)    Problem List Patient Active Problem List   Diagnosis Date Noted   Biceps tendonitis on left 02/10/2021   Vascular headache 08/05/2020   Hyponatremia 07/28/2020   Headache 07/24/2020   Neck pain, musculoskeletal 07/24/2020   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    Subarachnoid bleed ([place]) 07/09/2020   Subarachnoid hemorrhage ([place]) 06/29/2020   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[place]) 03/04/2019   S/P hysterectomy 12/13/2017   Abnormal laboratory test 09/09/2016  Gait abnormality 08/03/2016   Weakness 08/03/2016   Paresthesia 08/03/2016   S/P cervical spinal fusion 10/23/2015   Hypersomnia 12/08/2010   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   EPIGASTRIC PAIN 10/13/2009   IRRITABLE BOWEL SYNDROME 03/10/2009   BACK PAIN, LUMBAR 07/01/2008   COSTOCHONDRITIS 02/01/2008   VIRAL URI 01/08/2008   CHEST PAIN 01/08/2008   OTHER SPECIFIED EPISODIC MOOD DISORDER 12/25/2007   ARM PAIN  12/25/2007   ANEMIA-NOS 10/02/2007   Essential hypertension 10/02/2007   GERD 10/02/2007   POLYP, GALLBLADDER 10/02/2007   UTERINE POLYP 10/02/2007   OSTEOARTHRITIS 10/02/2007   Dyskinesia 10/02/2007   HEADACHE 10/02/2007   PALPITATIONS 10/02/2007   CARDIAC MURMUR, HX OF 10/02/2007   NEPHROLITHIASIS, HX OF 10/02/2007    Zachery Conch, OT 03/24/2021, 1:53 PM  [facility] [address] [place], [place], [address] Phone: [phone]   Fax:  [phone]  Name: Amy Barry MRN: [id number] Date of Birth: 04/03/1971

## 2021-03-26 ENCOUNTER — Ambulatory Visit: Payer: No Typology Code available for payment source

## 2021-03-26 ENCOUNTER — Ambulatory Visit: Payer: No Typology Code available for payment source | Admitting: Occupational Therapy

## 2021-03-31 ENCOUNTER — Other Ambulatory Visit: Payer: Self-pay

## 2021-03-31 ENCOUNTER — Encounter: Payer: Self-pay | Admitting: Occupational Therapy

## 2021-03-31 ENCOUNTER — Ambulatory Visit: Payer: No Typology Code available for payment source | Admitting: Occupational Therapy

## 2021-03-31 ENCOUNTER — Ambulatory Visit: Payer: No Typology Code available for payment source

## 2021-03-31 VITALS — BP 132/84

## 2021-03-31 DIAGNOSIS — M25512 Pain in left shoulder: Secondary | ICD-10-CM

## 2021-03-31 DIAGNOSIS — M6281 Muscle weakness (generalized): Secondary | ICD-10-CM

## 2021-03-31 DIAGNOSIS — R2689 Other abnormalities of gait and mobility: Secondary | ICD-10-CM

## 2021-03-31 DIAGNOSIS — R279 Unspecified lack of coordination: Secondary | ICD-10-CM

## 2021-03-31 DIAGNOSIS — M25612 Stiffness of left shoulder, not elsewhere classified: Secondary | ICD-10-CM

## 2021-03-31 DIAGNOSIS — R41840 Attention and concentration deficit: Secondary | ICD-10-CM

## 2021-03-31 DIAGNOSIS — R41844 Frontal lobe and executive function deficit: Secondary | ICD-10-CM

## 2021-03-31 DIAGNOSIS — I69054 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: Secondary | ICD-10-CM

## 2021-03-31 NOTE — Therapy (Signed)
[facility] [address] [place], [place], [address] Phone: [phone]   Fax:  [phone]  Physical Therapy Treatment  Patient Details  Name: Amy Barry MRN: [id number] Date of Birth: Feb 22, 1971 Referring Provider (PT): Dr. Naaman Plummer   Encounter Date: 03/31/2021   PT End of Session - 03/31/21 1538     Visit Number 2    Number of Visits 17    Date for PT Re-Evaluation 04/28/21    Authorization Type Aetna    PT Start Time 1535    PT Stop Time 7017    PT Time Calculation (min) 38 min    Behavior During Therapy [facility] for tasks assessed/performed             Past Medical History:  Diagnosis Date   Anxiety    Arthralgia    Generalized weakness    bilateral upper and lower extremities,  walks with cane   GERD (gastroesophageal reflux disease)    Headache(784.0)    Heart murmur    per pt "since childhood, no problems"   History of kidney stones    Hypertension    followed pcp   IBS (irritable bowel syndrome)    Iron deficiency anemia    12-06-2017 per pt recently had IV iron infusion July 2019   Low vitamin D level    Osteoarthritis    pt unsure about this   PVC (premature ventricular contraction)    per pt had holter monitor   SLE (systemic lupus erythematosus) ([place])    rheumotologist-- dr syed   Stroke [facility]) 06/2020   Wears glasses     Past Surgical History:  Procedure Laterality Date   CERVICAL DISC ARTHROPLASTY N/A 10/23/2015   Procedure: Cervical Disc Arthroplasty Cervical six-seven;  Surgeon: Eustace Moore, MD;  Location: [facility];  Service: Neurosurgery;  Laterality: N/A;   D & C HYSTEROSCOPY W/ RESECTION ENDOMETRIAL POLYP  10-13-2003    dr rivard @[facility]    ESOPHAGOGASTRODUODENOSCOPY     for GERD   IR ANGIO INTRA EXTRACRAN SEL COM CAROTID INNOMINATE BILAT MOD SED  07/06/2020   IR ANGIO INTRA EXTRACRAN SEL INTERNAL CAROTID BILAT MOD SED  06/29/2020   IR ANGIO VERTEBRAL SEL VERTEBRAL BILAT MOD SED   06/29/2020   IR ANGIO VERTEBRAL SEL VERTEBRAL BILAT MOD SED  07/06/2020   LAPAROSCOPIC ABDOMINAL EXPLORATION  1996   dx ibs   OVARIAN CYST SURGERY  age 50   x2 cyst   RADIOLOGY WITH ANESTHESIA N/A 06/29/2020   Procedure: IR WITH ANESTHESIA;  Surgeon: Radiologist, Medication, MD;  Location: [facility];  Service: Radiology;  Laterality: N/A;   ROBOTIC ASSISTED TOTAL HYSTERECTOMY N/A 12/13/2017   Procedure: XI ROBOTIC ASSISTED TOTAL HYSTERECTOMYWITH BILATERAL SALPINGECTOMY;  Surgeon: Christophe Louis, MD;  Location: [facility];  Service: Gynecology;  Laterality: N/A;   TUBAL LIGATION Bilateral 06-01-2002   dr Mancel Bale @[facility]    PPTL    Vitals:   03/31/21 1538  BP: 132/84     Subjective Assessment - 03/31/21 1538     Subjective Pt denies any new issues.    Patient is accompained by: Family member    Pertinent History medical history of generalized arthritis and myalgias and concern for functional weakness, SLE, irritable bowel, Subarachnoid hemorrhage    Limitations Standing;Walking;Writing;House hold activities;Reading;Lifting    How long can you sit comfortably? no issues    How long can you stand comfortably? 2 min    How long can you walk comfortably? 5  min    Patient Stated Goals Improve independence    Currently in Pain? Yes    Pain Score 8     Pain Location Arm    Pain Orientation Left    Pain Descriptors / Indicators Aching    Pain Type Chronic pain    Pain Onset More than a month ago    Pain Frequency Constant    Aggravating Factors  overuse    Pain Relieving Factors pain medication    Pain Onset More than a month ago                [facility] PT Assessment - 03/31/21 1540       ROM / Strength   AROM / PROM / Strength Strength      Strength   Overall Strength Comments BLE difficult to formally assess due to catch and release and ataxic movements throughout but brossly 3+to 4/5 throughout BLE with left slightly weakner than right.                           [place]  Adult PT Treatment/Exercise - 03/31/21 1540       Transfers   Transfers Sit to Stand;Stand to Sit    Sit to Stand 4: Min assist    Sit to Stand Details Verbal cues for technique    Sit to Stand Details (indicate cue type and reason) Pt was cued to lean forward and come up to stand getting her balance. Held to PT arm to stabilize at times. Performed x 5 with PT in front to stabilize as needed. Cued to engage core in standing to try to help stabilize.    Stand to Sit 4: Min assist    Stand to Sit Details (indicate cue type and reason) Verbal cues for technique    Stand to Sit Details Cued to bend at waist prior to going to sit and reach back      Ambulation/Gait   Ambulation/Gait Yes    Ambulation/Gait Assistance 5: Supervision;4: Min guard    Ambulation Distance (Feet) 115 Feet    Assistive device Rolling Brekke    Gait Pattern Step-through pattern;Decreased step length - right;Decreased step length - left    Stairs Yes    Stairs Assistance 4: Min guard    Stairs Assistance Details (indicate cue type and reason) step-to up leading with RLE then sideways with descent    Stair Management Technique One rail Right;Step to pattern;Sideways    Number of Stairs 4    Height of Stairs 6      Neuro Re-ed    Neuro Re-ed Details  Standing at [place]: attempted to tap cone but hardly able to reach with RLE and unable on left. Switched to 2 therastones and performed x 4 on each foot with cues to try to stand tall and control movements. Pt had ataxic movements on both legs.                       PT Short Term Goals - 03/31/21 2018       PT SHORT TERM GOAL #1   Title Patient will report at least 30% reduction in her daily headaches intensity and frequency to improve overall function    Baseline Constant headache that ranges from 4-9/10 daily. 03/31/21 Deferred as not treating for headaches as referral for brain bleed.    Time 6    Period Weeks  Status Deferred    Target Date  04/14/21      PT SHORT TERM GOAL #2   Title Patient will demo improve cervical extension by 5 deg to improve overall cervical ROM    Baseline 03/31/21 Deferred as not treating neck as referral for brain bleed    Time 6    Period Weeks    Status Deferred    Target Date 04/14/21      PT SHORT TERM GOAL #3   Title Patient will be able to ambulate 40' with RW with SBA without needing  aresting break to improve walking endurance in community    Baseline 115' with RW CGA    Time 6    Period Weeks    Status New    Target Date 04/14/21      PT SHORT TERM GOAL #4   Title Patient will be able to perform sit to stand without use of UE to improve functional strength in LE    Baseline Definite need of R UE use with sit to stand transfer    Time 6    Period Weeks    Status New    Target Date 04/14/21               PT Long Term Goals - 03/03/21 1035       PT LONG TERM GOAL #1   Title Patient will demo 0.23m/s improvement in her 10 meter walk gait speed with RW to progress to community ambulator    Baseline 0.41 m/s with RW    Time 8    Period Weeks    Status New    Target Date 04/28/21      PT LONG TERM GOAL #2   Title Patient will report headache frequency of <2x/week to improve daily functioning    Baseline constant headache, headache everyday    Time 8    Period Weeks    Status New    Target Date 04/28/21      PT LONG TERM GOAL #3   Title Pt will demo TUG score in <20 seconds with RW to improve functional mobility    Baseline 40.7 seconds with RW    Time 8    Period Weeks    Status New    Target Date 04/28/21      PT LONG TERM GOAL #4   Title Pt will be able to perform 5x sit to stand with or without UE support in <20 seconds to improve functional strength with transfers    Baseline 34 sec    Time 8    Period Weeks    Status New    Target Date 04/28/21                   Plan - 03/31/21 2019     Clinical Impression Statement PT further assessed  strength at visit today. Difficult to formally assess due to catch and release and ataxic movements throughout with inconsistencies noted. Pt was challenged with performing sit to stand without [place] in front of her. Ataxic type movements noted in standing with decreased UE support having hold PT arm versus [place] as well as when had her try to tap to targets. Deferred neck and headache goals from eval as referral was for brain bleed.    Personal Factors and Comorbidities Comorbidity 2;Past/Current Experience;Time since onset of injury/illness/exacerbation    Comorbidities medical history of generalized arthritis and myalgias and concern for functional weakness, SLE, irritable bowel,  Examination-Activity Limitations Bathing;Caring for Others;Carry;Dressing;Hygiene/Grooming;Lift;Locomotion Level;Reach Overhead;Squat;Stairs;Stand;Transfers    Examination-Participation Restrictions Church;Cleaning;Community Activity;Driving;Laundry;Medication Management;Meal Prep;Occupation;Shop;Yard Work;Personal Finances    Stability/Clinical Decision Making Evolving/Moderate complexity    Rehab Potential Good    PT Frequency 2x / week    PT Duration 8 weeks    PT Treatment/Interventions ADLs/Self Care Home Management;Cryotherapy;Moist Heat;Traction;Gait training;Stair training;Functional mobility training;Therapeutic activities;Therapeutic exercise;Balance training;Manual techniques;Orthotic Fit/Training;Patient/family education;Cognitive remediation;Neuromuscular re-education;Passive range of motion;Energy conservation;Vestibular;Visual/perceptual remediation/compensation;Joint Manipulations;Spinal Manipulations    PT Next Visit Plan HEP with family assist.  work on transfers with proper hand placement, standing balance activities, gait training    Consulted and Agree with Plan of Care Patient;Family member/caregiver    Family Member Consulted daughter             Patient will benefit from skilled  therapeutic intervention in order to improve the following deficits and impairments:  Abnormal gait, Decreased activity tolerance, Decreased balance, Decreased cognition, Decreased safety awareness, Decreased range of motion, Decreased mobility, Decreased endurance, Decreased strength, Difficulty walking, Dizziness, Impaired flexibility, Increased fascial restricitons, Hypomobility, Impaired tone, Impaired UE functional use, Improper body mechanics, Postural dysfunction, Pain  Visit Diagnosis: Other abnormalities of gait and mobility  Muscle weakness (generalized)     Problem List Patient Active Problem List   Diagnosis Date Noted   Biceps tendonitis on left 02/10/2021   Vascular headache 08/05/2020   Hyponatremia 07/28/2020   Headache 07/24/2020   Neck pain, musculoskeletal 07/24/2020   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    Subarachnoid bleed ([place]) 07/09/2020   Subarachnoid hemorrhage ([place]) 06/29/2020   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[place]) 03/04/2019   S/P hysterectomy 12/13/2017   Abnormal laboratory test 09/09/2016   Gait abnormality 08/03/2016   Weakness 08/03/2016   Paresthesia 08/03/2016   S/P cervical spinal fusion 10/23/2015   Hypersomnia 12/08/2010   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   EPIGASTRIC PAIN 10/13/2009   IRRITABLE BOWEL SYNDROME 03/10/2009   BACK PAIN, LUMBAR 07/01/2008   COSTOCHONDRITIS 02/01/2008   VIRAL URI 01/08/2008   CHEST PAIN 01/08/2008   OTHER SPECIFIED EPISODIC MOOD DISORDER 12/25/2007   ARM PAIN 12/25/2007   ANEMIA-NOS 10/02/2007   Essential hypertension 10/02/2007   GERD 10/02/2007   POLYP, GALLBLADDER 10/02/2007   UTERINE POLYP 10/02/2007   OSTEOARTHRITIS 10/02/2007   Dyskinesia 10/02/2007   HEADACHE 10/02/2007   PALPITATIONS 10/02/2007   CARDIAC MURMUR, HX OF 10/02/2007   NEPHROLITHIASIS, HX OF 10/02/2007    Electa Sniff, PT, DPT, NCS 03/31/2021, 8:25 PM  [facility] [address] [place], [place], [address] Phone: [phone]   Fax:  [phone]  Name: CRISLYN WILLBANKS MRN: [id number] Date of Birth: 1970-07-13

## 2021-03-31 NOTE — Therapy (Signed)
[facility] [address] [place], [place], [address] Phone: [phone]   Fax:  [phone]  Occupational Therapy Treatment  Patient Details  Name: Amy Barry MRN: [id number] Date of Birth: 1970/09/20 Referring Provider (OT): Oval Linsey, MD   Encounter Date: 03/31/2021   OT End of Session - 03/31/21 1616     Visit Number 7    Number of Visits 17    Date for OT Re-Evaluation 05/12/21    Authorization Type Aetna    Authorization Time Period 120 visits PT/OT, 120 visits ST    Authorization - Number of Visits 34    OT Start Time 1614    OT Stop Time 1700    OT Time Calculation (min) 46 min    Activity Tolerance Patient tolerated treatment well    Behavior During Therapy WFL for tasks assessed/performed             Past Medical History:  Diagnosis Date   Anxiety    Arthralgia    Generalized weakness    bilateral upper and lower extremities,  walks with cane   GERD (gastroesophageal reflux disease)    Headache(784.0)    Heart murmur    per pt "since childhood, no problems"   History of kidney stones    Hypertension    followed pcp   IBS (irritable bowel syndrome)    Iron deficiency anemia    12-06-2017 per pt recently had IV iron infusion July 2019   Low vitamin D level    Osteoarthritis    pt unsure about this   PVC (premature ventricular contraction)    per pt had holter monitor   SLE (systemic lupus erythematosus) ([place])    rheumotologist-- dr syed   Stroke [facility]) 06/2020   Wears glasses     Past Surgical History:  Procedure Laterality Date   CERVICAL DISC ARTHROPLASTY N/A 10/23/2015   Procedure: Cervical Disc Arthroplasty Cervical six-seven;  Surgeon: Eustace Moore, MD;  Location: [facility];  Service: Neurosurgery;  Laterality: N/A;   D & C HYSTEROSCOPY W/ RESECTION ENDOMETRIAL POLYP  10-13-2003    dr rivard @[facility]    ESOPHAGOGASTRODUODENOSCOPY     for GERD   IR ANGIO INTRA EXTRACRAN SEL COM  CAROTID INNOMINATE BILAT MOD SED  07/06/2020   IR ANGIO INTRA EXTRACRAN SEL INTERNAL CAROTID BILAT MOD SED  06/29/2020   IR ANGIO VERTEBRAL SEL VERTEBRAL BILAT MOD SED  06/29/2020   IR ANGIO VERTEBRAL SEL VERTEBRAL BILAT MOD SED  07/06/2020   LAPAROSCOPIC ABDOMINAL EXPLORATION  1996   dx ibs   OVARIAN CYST SURGERY  age 36   x2 cyst   RADIOLOGY WITH ANESTHESIA N/A 06/29/2020   Procedure: IR WITH ANESTHESIA;  Surgeon: Radiologist, Medication, MD;  Location: [facility];  Service: Radiology;  Laterality: N/A;   ROBOTIC ASSISTED TOTAL HYSTERECTOMY N/A 12/13/2017   Procedure: XI ROBOTIC ASSISTED TOTAL HYSTERECTOMYWITH BILATERAL SALPINGECTOMY;  Surgeon: Christophe Louis, MD;  Location: [facility];  Service: Gynecology;  Laterality: N/A;   TUBAL LIGATION Bilateral 06-01-2002   dr Mancel Bale @[facility]    PPTL    There were no vitals filed for this visit.   Subjective Assessment - 03/31/21 1614     Subjective  "I usually try and stay on my schedule with pain meds and got off schedule and I paid for it"    Patient is accompanied by: Family member   daughter   Pertinent History PMH: HTN, IBS, anemia, connective tissue disorder, family history  of cerebral aneurysms    Limitations Fall Risk, L shoulder pain, L hemi    Currently in Pain? Yes    Pain Score 8     Pain Location Arm    Pain Orientation Left    Pain Descriptors / Indicators Aching    Pain Type Chronic pain    Pain Onset More than a month ago    Pain Frequency Constant    Aggravating Factors  overuse    Pain Relieving Factors pain medication            UB dressing with mod verbal cues and min A for assistance with pulling shirt over L shoulder. Pt was able to thread BUE and pull shirt overhead and doff with increased time and little difficulty.   Weight Shifts Standing towards mat with weight shifts on hands and forearms with mod A, forward reaching on hands and sliding forward for standing balance and gentle shoulder range of motion. Pt req'd min A for  standing balance and verbal cues for scapular stabilization as patient with guarding and tension in LUE shoulder during movements.   UE ranger with LUE in sitting with forward reaching and horizontal abdudction with good control and no report of increased pain.  Trunk Rotation to right with LUE adjacent to L of body maintaining position so to provide gentle range of motion in LUE. Pt reported no increased in pain.  Low Level Reaching with LUE to target with reports of increased pain and discomfort with reaching against gravity.   Weight bearing gently on forearm LUE to left of body while seated edge of mat on elevated surface. Pt tolerated well with no complaints of increased pain.                     OT Short Term Goals - 03/22/21 1844       OT SHORT TERM GOAL #1   Title Pt will be independent with initial HEP for LUE    Time 4    Period Weeks    Status On-going    Target Date 03/31/21      OT SHORT TERM GOAL #2   Title Pt will perform UB dressing with pain no greater than 7/10 in LUE shoulder and with no physical assistance    Time 4    Period Weeks    Status On-going      OT SHORT TERM GOAL #3   Title Pt will achieve 90 degrees of AROM shoulder flexion with LUE in order to progress to using LUE for functional reaching activities    Baseline 75* AROM - limited by pain    Time 4    Period Weeks    Status On-going      OT SHORT TERM GOAL #4   Title Pt will increase grip strength in LUE by 5lbs or greater.    Baseline L 19.6, R 39.4    Time 4    Period Weeks    Status On-going      OT SHORT TERM GOAL #5   Title Pt will perform simple warm meal prep and/or light housekeeping with supervision and good safety awareness.    Time 4    Period Weeks    Status On-going               OT Long Term Goals - 03/22/21 1844       OT LONG TERM GOAL #1   Title Pt will be independent with updated  HEP for LUE    Time 10    Period Weeks    Status On-going     Target Date 05/12/21      OT LONG TERM GOAL #2   Title Pt will report pain no greater than 6/10 while completing HEP    Time 10    Period Weeks    Status On-going      OT LONG TERM GOAL #3   Title Pt will increase fine motor coordination in LUE by completing 9 hole peg test in 25 seconds or less.    Baseline R 21.53s, L 32.28s    Time 10    Period Weeks    Status On-going      OT LONG TERM GOAL #4   Title Pt will demonstrate ability to obtain a lightweight object at 110 degrees shoulder flexion or greater    Baseline 75    Time 10    Period Weeks    Status On-going      OT LONG TERM GOAL #5   Title Pt will increase grip strength in LUE to 30 lbs or greater for increased functional use.    Time 10    Period Weeks    Status On-going                   Plan - 04/01/21 9024     Clinical Impression Statement Pt with significant guarding and shoulder hiking and tension with movement and at rest in LUE. Pt tolerating more NMR and LUE movement this day.    OT Occupational Profile and History Detailed Assessment- Review of Records and additional review of physical, cognitive, psychosocial history related to current functional performance    Occupational performance deficits (Please refer to evaluation for details): ADL's;IADL's    Body Structure / Function / Physical Skills ADL;Decreased knowledge of use of DME;Strength;Dexterity;GMC;Pain;UE functional use;IADL;ROM;Vision;Flexibility;Sensation;FMC;Coordination    Cognitive Skills Attention;Problem Solve;Understand;Emotional    Rehab Potential Good    Clinical Decision Making Several treatment options, min-mod task modification necessary    Comorbidities Affecting Occupational Performance: May have comorbidities impacting occupational performance    Modification or Assistance to Complete Evaluation  Min-Moderate modification of tasks or assist with assess necessary to complete eval    OT Frequency 2x / week    OT Duration  Other (comment)   16 visits over 10 weeks for any scheduling conflicts   OT Treatment/Interventions Self-care/ADL training;Moist Heat;Fluidtherapy;DME and/or AE instruction;Splinting;Therapeutic activities;Traction;Aquatic Therapy;Ultrasound;Therapeutic exercise;Cognitive remediation/compensation;Visual/perceptual remediation/compensation;Passive range of motion;Functional Mobility Training;Neuromuscular education;Electrical Stimulation;Energy conservation;Manual Therapy;Patient/family education    Plan review sleep positions, NMR LUE, aquatics therapy for LUE and balance    OT Home Exercise Plan aquatics for balance and shoulder pain, started with seated self PROM to floor - review at first session    Recommended Other Services seeing PT, ST    Consulted and Agree with Plan of Care Patient;Family member/caregiver    Family Member Consulted daughter, Amy Barry             Patient will benefit from skilled therapeutic intervention in order to improve the following deficits and impairments:   Body Structure / Function / Physical Skills: ADL, Decreased knowledge of use of DME, Strength, Dexterity, GMC, Pain, UE functional use, IADL, ROM, Vision, Flexibility, Sensation, FMC, Coordination Cognitive Skills: Attention, Problem Solve, Understand, Emotional     Visit Diagnosis: 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 (HCC)  Stiffness of left shoulder, not elsewhere classified  Lack of coordination  Attention and concentration deficit  Frontal lobe and executive function deficit  Acute pain of left shoulder  Muscle weakness (generalized)    Problem List Patient Active Problem List   Diagnosis Date Noted   Biceps tendonitis on left 02/10/2021   Vascular headache 08/05/2020   Hyponatremia 07/28/2020   Headache 07/24/2020   Neck pain, musculoskeletal 07/24/2020   Cognitive deficits    Frontal lobe and executive function  deficit    Subarachnoid bleed ([place]) 07/09/2020   Subarachnoid hemorrhage ([place]) 06/29/2020   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[place]) 03/04/2019   S/P hysterectomy 12/13/2017   Abnormal laboratory test 09/09/2016   Gait abnormality 08/03/2016   Weakness 08/03/2016   Paresthesia 08/03/2016   S/P cervical spinal fusion 10/23/2015   Hypersomnia 12/08/2010   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   EPIGASTRIC PAIN 10/13/2009   IRRITABLE BOWEL SYNDROME 03/10/2009   BACK PAIN, LUMBAR 07/01/2008   COSTOCHONDRITIS 02/01/2008   VIRAL URI 01/08/2008   CHEST PAIN 01/08/2008   OTHER SPECIFIED EPISODIC MOOD DISORDER 12/25/2007   ARM PAIN 12/25/2007   ANEMIA-NOS 10/02/2007   Essential hypertension 10/02/2007   GERD 10/02/2007   POLYP, GALLBLADDER 10/02/2007   UTERINE POLYP 10/02/2007   OSTEOARTHRITIS 10/02/2007   Dyskinesia 10/02/2007   HEADACHE 10/02/2007   PALPITATIONS 10/02/2007   CARDIAC MURMUR, HX OF 10/02/2007   NEPHROLITHIASIS, HX OF 10/02/2007    Zachery Conch, OT 04/01/2021, 8:32 AM  [facility] [address] [place], [place], [address] Phone: [phone]   Fax:  [phone]  Name: LEIGHANNA KIRN MRN: [id number] Date of Birth: [birth year]

## 2021-04-01 ENCOUNTER — Ambulatory Visit: Payer: No Typology Code available for payment source

## 2021-04-01 ENCOUNTER — Encounter: Payer: Self-pay | Admitting: Occupational Therapy

## 2021-04-01 ENCOUNTER — Ambulatory Visit: Payer: No Typology Code available for payment source | Admitting: Occupational Therapy

## 2021-04-01 DIAGNOSIS — R279 Unspecified lack of coordination: Secondary | ICD-10-CM

## 2021-04-01 DIAGNOSIS — R41844 Frontal lobe and executive function deficit: Secondary | ICD-10-CM

## 2021-04-01 DIAGNOSIS — I69054 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: Secondary | ICD-10-CM

## 2021-04-01 DIAGNOSIS — M6281 Muscle weakness (generalized): Secondary | ICD-10-CM

## 2021-04-01 DIAGNOSIS — M25612 Stiffness of left shoulder, not elsewhere classified: Secondary | ICD-10-CM | POA: Diagnosis not present

## 2021-04-01 DIAGNOSIS — R41841 Cognitive communication deficit: Secondary | ICD-10-CM

## 2021-04-01 DIAGNOSIS — R41840 Attention and concentration deficit: Secondary | ICD-10-CM

## 2021-04-01 DIAGNOSIS — R2689 Other abnormalities of gait and mobility: Secondary | ICD-10-CM

## 2021-04-01 DIAGNOSIS — M25512 Pain in left shoulder: Secondary | ICD-10-CM

## 2021-04-01 NOTE — Therapy (Signed)
[facility] [address] [place], [place], [address] Phone: [phone]   Fax:  [phone]  Physical Therapy Treatment  Patient Details  Name: Amy Barry MRN: [id number] Date of Birth: 04-Aug-1970 Referring Provider (PT): Dr. Naaman Plummer   Encounter Date: 04/01/2021   PT End of Session - 04/01/21 1539     Visit Number 3    Number of Visits 17    Date for PT Re-Evaluation 04/28/21    Authorization Type Aetna    PT Start Time 1538   PT running behind   PT Stop Time 2993    PT Time Calculation (min) 36 min    Behavior During Therapy [facility] for tasks assessed/performed             Past Medical History:  Diagnosis Date   Anxiety    Arthralgia    Generalized weakness    bilateral upper and lower extremities,  walks with cane   GERD (gastroesophageal reflux disease)    Headache(784.0)    Heart murmur    per pt "since childhood, no problems"   History of kidney stones    Hypertension    followed pcp   IBS (irritable bowel syndrome)    Iron deficiency anemia    12-06-2017 per pt recently had IV iron infusion July 2019   Low vitamin D level    Osteoarthritis    pt unsure about this   PVC (premature ventricular contraction)    per pt had holter monitor   SLE (systemic lupus erythematosus) ([place])    rheumotologist-- dr syed   Stroke [facility]) 06/2020   Wears glasses     Past Surgical History:  Procedure Laterality Date   CERVICAL DISC ARTHROPLASTY N/A 10/23/2015   Procedure: Cervical Disc Arthroplasty Cervical six-seven;  Surgeon: Eustace Moore, MD;  Location: [facility];  Service: Neurosurgery;  Laterality: N/A;   D & C HYSTEROSCOPY W/ RESECTION ENDOMETRIAL POLYP  10-13-2003    dr rivard @[facility]    ESOPHAGOGASTRODUODENOSCOPY     for GERD   IR ANGIO INTRA EXTRACRAN SEL COM CAROTID INNOMINATE BILAT MOD SED  07/06/2020   IR ANGIO INTRA EXTRACRAN SEL INTERNAL CAROTID BILAT MOD SED  06/29/2020   IR ANGIO VERTEBRAL SEL  VERTEBRAL BILAT MOD SED  06/29/2020   IR ANGIO VERTEBRAL SEL VERTEBRAL BILAT MOD SED  07/06/2020   LAPAROSCOPIC ABDOMINAL EXPLORATION  1996   dx ibs   OVARIAN CYST SURGERY  age 77   x2 cyst   RADIOLOGY WITH ANESTHESIA N/A 06/29/2020   Procedure: IR WITH ANESTHESIA;  Surgeon: Radiologist, Medication, MD;  Location: [facility];  Service: Radiology;  Laterality: N/A;   ROBOTIC ASSISTED TOTAL HYSTERECTOMY N/A 12/13/2017   Procedure: XI ROBOTIC ASSISTED TOTAL HYSTERECTOMYWITH BILATERAL SALPINGECTOMY;  Surgeon: Christophe Louis, MD;  Location: [facility];  Service: Gynecology;  Laterality: N/A;   TUBAL LIGATION Bilateral 06-01-2002   dr Mancel Bale @[facility]    PPTL    There were no vitals filed for this visit.   Subjective Assessment - 04/01/21 1539     Subjective Pt reports she is doing ok. No new issues. Shoulder feeling better today.    Patient is accompained by: Family member    Pertinent History medical history of generalized arthritis and myalgias and concern for functional weakness, SLE, irritable bowel, Subarachnoid hemorrhage    Limitations Standing;Walking;Writing;House hold activities;Reading;Lifting    How long can you sit comfortably? no issues    How long can you stand comfortably? 2 min  How long can you walk comfortably? 5 min    Patient Stated Goals Improve independence    Pain Score 6     Pain Location Shoulder    Pain Orientation Left    Pain Descriptors / Indicators Aching    Pain Type Chronic pain    Pain Onset More than a month ago    Pain Frequency Constant    Aggravating Factors  overuse    Pain Onset More than a month ago                               [facility] Adult PT Treatment/Exercise - 04/01/21 1540       Transfers   Transfers Sit to Stand;Stand to Sit    Sit to Stand 4: Min assist    Sit to Stand Details Verbal cues for technique    Stand to Sit 4: Min assist;With upper extremity assist      Ambulation/Gait   Ambulation/Gait Yes    Ambulation/Gait  Assistance 5: Supervision;4: Min guard    Ambulation/Gait Assistance Details Pt was cued to relax shoulders for more upright posture.    Ambulation Distance (Feet) 230 Feet   175'x 1   Assistive device [place]    Gait Pattern Step-through pattern;Decreased step length - right;Decreased step length - left;Ataxic    Ambulation Surface Level;Indoor      Neuro Re-ed    Neuro Re-ed Details  Sit to stand x 5 from edge of mat with [place] in front. Verbal cues to try to use momentum to help with rising more smoothly pushing with arms. Upon rising pt has extraneous movements throughout bilateral LE and UE with LOB posterior at times needing CGA/min assist for safety. Added in standing and walking 10' around come and back x 3 and decreased sway noted. Performed step-up on 4" step at [place] but then pt unable to lift right foot off step. PT tried to get her to take a couple deep breaths and just focus on lifting right foot. She eventually slid foot off but was very anxious. Trialed just tapping step with RLE and off and same thing happended. PT discussed with pt to try to just stop and calm down when she has trouble doing something and then try again. Explained that she was able to climb steps yesterday and they were higher. Performed standing march x 5 each foot with verbal cues to try to set foot down gently. Extraneous movements noted throughout.                       PT Short Term Goals - 03/31/21 2018       PT SHORT TERM GOAL #1   Title Patient will report at least 30% reduction in her daily headaches intensity and frequency to improve overall function    Baseline Constant headache that ranges from 4-9/10 daily. 03/31/21 Deferred as not treating for headaches as referral for brain bleed.    Time 6    Period Weeks    Status Deferred    Target Date 04/14/21      PT SHORT TERM GOAL #2   Title Patient will demo improve cervical extension by 5 deg to improve overall cervical ROM     Baseline 03/31/21 Deferred as not treating neck as referral for brain bleed    Time 6    Period Weeks    Status Deferred  Target Date 04/14/21      PT SHORT TERM GOAL #3   Title Patient will be able to ambulate 48' with RW with SBA without needing  aresting break to improve walking endurance in community    Baseline 115' with RW CGA    Time 6    Period Weeks    Status New    Target Date 04/14/21      PT SHORT TERM GOAL #4   Title Patient will be able to perform sit to stand without use of UE to improve functional strength in LE    Baseline Definite need of R UE use with sit to stand transfer    Time 6    Period Weeks    Status New    Target Date 04/14/21               PT Long Term Goals - 03/03/21 1035       PT LONG TERM GOAL #1   Title Patient will demo 0.61m/s improvement in her 10 meter walk gait speed with RW to progress to community ambulator    Baseline 0.41 m/s with RW    Time 8    Period Weeks    Status New    Target Date 04/28/21      PT LONG TERM GOAL #2   Title Patient will report headache frequency of <2x/week to improve daily functioning    Baseline constant headache, headache everyday    Time 8    Period Weeks    Status New    Target Date 04/28/21      PT LONG TERM GOAL #3   Title Pt will demo TUG score in <20 seconds with RW to improve functional mobility    Baseline 40.7 seconds with RW    Time 8    Period Weeks    Status New    Target Date 04/28/21      PT LONG TERM GOAL #4   Title Pt will be able to perform 5x sit to stand with or without UE support in <20 seconds to improve functional strength with transfers    Baseline 34 sec    Time 8    Period Weeks    Status New    Target Date 04/28/21                   Plan - 04/01/21 1635     Clinical Impression Statement Pt continues to present with inconsistent extraneous movements during activities. She gets anxious quickly when unable to perform something. Discussed when that  happens taking a second and trying again.    Personal Factors and Comorbidities Comorbidity 2;Past/Current Experience;Time since onset of injury/illness/exacerbation    Comorbidities medical history of generalized arthritis and myalgias and concern for functional weakness, SLE, irritable bowel,    Examination-Activity Limitations Bathing;Caring for Others;Carry;Dressing;Hygiene/Grooming;Lift;Locomotion Level;Reach Overhead;Squat;Stairs;Stand;Transfers    Examination-Participation Restrictions Church;Cleaning;Community Activity;Driving;Laundry;Medication Management;Meal Prep;Occupation;Shop;Yard Work;Personal Finances    Stability/Clinical Decision Making Evolving/Moderate complexity    Rehab Potential Good    PT Frequency 2x / week    PT Duration 8 weeks    PT Treatment/Interventions ADLs/Self Care Home Management;Cryotherapy;Moist Heat;Traction;Gait training;Stair training;Functional mobility training;Therapeutic activities;Therapeutic exercise;Balance training;Manual techniques;Orthotic Fit/Training;Patient/family education;Cognitive remediation;Neuromuscular re-education;Passive range of motion;Energy conservation;Vestibular;Visual/perceptual remediation/compensation;Joint Manipulations;Spinal Manipulations    PT Next Visit Plan Try NuStep for some aerobic activity and strengthening. HEP with family assist.  work on transfers with proper hand placement, standing balance activities, gait training    Consulted and Agree  with Plan of Care Patient;Family member/caregiver    Family Member Consulted daughter             Patient will benefit from skilled therapeutic intervention in order to improve the following deficits and impairments:  Abnormal gait, Decreased activity tolerance, Decreased balance, Decreased cognition, Decreased safety awareness, Decreased range of motion, Decreased mobility, Decreased endurance, Decreased strength, Difficulty walking, Dizziness, Impaired flexibility, Increased  fascial restricitons, Hypomobility, Impaired tone, Impaired UE functional use, Improper body mechanics, Postural dysfunction, Pain  Visit Diagnosis: Other abnormalities of gait and mobility  Muscle weakness (generalized)     Problem List Patient Active Problem List   Diagnosis Date Noted   Biceps tendonitis on left 02/10/2021   Vascular headache 08/05/2020   Hyponatremia 07/28/2020   Headache 07/24/2020   Neck pain, musculoskeletal 07/24/2020   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    Subarachnoid bleed ([place]) 07/09/2020   Subarachnoid hemorrhage ([place]) 06/29/2020   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[place]) 03/04/2019   S/P hysterectomy 12/13/2017   Abnormal laboratory test 09/09/2016   Gait abnormality 08/03/2016   Weakness 08/03/2016   Paresthesia 08/03/2016   S/P cervical spinal fusion 10/23/2015   Hypersomnia 12/08/2010   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   EPIGASTRIC PAIN 10/13/2009   IRRITABLE BOWEL SYNDROME 03/10/2009   BACK PAIN, LUMBAR 07/01/2008   COSTOCHONDRITIS 02/01/2008   VIRAL URI 01/08/2008   CHEST PAIN 01/08/2008   OTHER SPECIFIED EPISODIC MOOD DISORDER 12/25/2007   ARM PAIN 12/25/2007   ANEMIA-NOS 10/02/2007   Essential hypertension 10/02/2007   GERD 10/02/2007   POLYP, GALLBLADDER 10/02/2007   UTERINE POLYP 10/02/2007   OSTEOARTHRITIS 10/02/2007   Dyskinesia 10/02/2007   HEADACHE 10/02/2007   PALPITATIONS 10/02/2007   CARDIAC MURMUR, HX OF 10/02/2007   NEPHROLITHIASIS, HX OF 10/02/2007    Electa Sniff, PT, DPT, NCS 04/01/2021, 4:44 PM  [facility] [address] [place], [place], [address] Phone: [phone]   Fax:  [phone]  Name: Amy Barry MRN: [id number] Date of Birth: 1970-09-08

## 2021-04-01 NOTE — Therapy (Signed)
[facility] [address] [place], [place], [address] Phone: [phone]   Fax:  [phone]  Occupational Therapy Treatment  Patient Details  Name: Amy Barry MRN: [id number] Date of Birth: June 15, 1970 Referring Provider (OT): Oval Linsey, MD   Encounter Date: 04/01/2021   OT End of Session - 04/01/21 1451     Visit Number 8    Number of Visits 17    Date for OT Re-Evaluation 05/12/21    Authorization Type Aetna    Authorization Time Period 120 visits PT/OT, 120 visits ST    Authorization - Number of Visits 65    OT Start Time 1448    OT Stop Time 1530    OT Time Calculation (min) 42 min    Activity Tolerance Patient tolerated treatment well    Behavior During Therapy WFL for tasks assessed/performed             Past Medical History:  Diagnosis Date   Anxiety    Arthralgia    Generalized weakness    bilateral upper and lower extremities,  walks with cane   GERD (gastroesophageal reflux disease)    Headache(784.0)    Heart murmur    per pt "since childhood, no problems"   History of kidney stones    Hypertension    followed pcp   IBS (irritable bowel syndrome)    Iron deficiency anemia    12-06-2017 per pt recently had IV iron infusion July 2019   Low vitamin D level    Osteoarthritis    pt unsure about this   PVC (premature ventricular contraction)    per pt had holter monitor   SLE (systemic lupus erythematosus) ([place])    rheumotologist-- dr syed   Stroke [facility]) 06/2020   Wears glasses     Past Surgical History:  Procedure Laterality Date   CERVICAL DISC ARTHROPLASTY N/A 10/23/2015   Procedure: Cervical Disc Arthroplasty Cervical six-seven;  Surgeon: Eustace Moore, MD;  Location: [facility];  Service: Neurosurgery;  Laterality: N/A;   D & C HYSTEROSCOPY W/ RESECTION ENDOMETRIAL POLYP  10-13-2003    dr rivard @[facility]    ESOPHAGOGASTRODUODENOSCOPY     for GERD   IR ANGIO INTRA EXTRACRAN SEL COM  CAROTID INNOMINATE BILAT MOD SED  07/06/2020   IR ANGIO INTRA EXTRACRAN SEL INTERNAL CAROTID BILAT MOD SED  06/29/2020   IR ANGIO VERTEBRAL SEL VERTEBRAL BILAT MOD SED  06/29/2020   IR ANGIO VERTEBRAL SEL VERTEBRAL BILAT MOD SED  07/06/2020   LAPAROSCOPIC ABDOMINAL EXPLORATION  1996   dx ibs   OVARIAN CYST SURGERY  age 35   x2 cyst   RADIOLOGY WITH ANESTHESIA N/A 06/29/2020   Procedure: IR WITH ANESTHESIA;  Surgeon: Radiologist, Medication, MD;  Location: [facility];  Service: Radiology;  Laterality: N/A;   ROBOTIC ASSISTED TOTAL HYSTERECTOMY N/A 12/13/2017   Procedure: XI ROBOTIC ASSISTED TOTAL HYSTERECTOMYWITH BILATERAL SALPINGECTOMY;  Surgeon: Christophe Louis, MD;  Location: [facility];  Service: Gynecology;  Laterality: N/A;   TUBAL LIGATION Bilateral 06-01-2002   dr Mancel Bale @[facility]    PPTL    There were no vitals filed for this visit.   Subjective Assessment - 04/01/21 1450     Subjective  "it doesn't hurt as bad as it did yesterday"    Patient is accompanied by: Family member   daughter   Pertinent History PMH: HTN, IBS, anemia, connective tissue disorder, family history of cerebral aneurysms    Limitations Fall Risk, L shoulder  pain, L hemi    Currently in Pain? Yes    Pain Score 6     Pain Location Arm    Pain Orientation Left    Pain Descriptors / Indicators Aching    Pain Type Chronic pain    Pain Onset More than a month ago    Pain Frequency Constant             Grasp Release with Low Level Reach with LUE with 1 inch blocks. Pt with good range of motion and movement of LUE however still presents with increased shoulder tension and guarding. Pt inconsistent with reports of pain and movement patterns with LUE.   Gentle ROM for LUE with trunk rotation and arms on chair with seated modified cat/cow poses for increased shoulder mobility  Self PROM to floor with slight pendulum for increased range of motion and gentle movement of shoulder in gravity eliminated position.  Pt continues to  present with guarding even in gravity eliminated positions and movements and sometimes with RUE in conjunction with LUE. Continue to encourage BUE normal movement patterns in gentle range of motion and movements.                       OT Short Term Goals - 03/22/21 1844       OT SHORT TERM GOAL #1   Title Pt will be independent with initial HEP for LUE    Time 4    Period Weeks    Status On-going    Target Date 03/31/21      OT SHORT TERM GOAL #2   Title Pt will perform UB dressing with pain no greater than 7/10 in LUE shoulder and with no physical assistance    Time 4    Period Weeks    Status On-going      OT SHORT TERM GOAL #3   Title Pt will achieve 90 degrees of AROM shoulder flexion with LUE in order to progress to using LUE for functional reaching activities    Baseline 75* AROM - limited by pain    Time 4    Period Weeks    Status On-going      OT SHORT TERM GOAL #4   Title Pt will increase grip strength in LUE by 5lbs or greater.    Baseline L 19.6, R 39.4    Time 4    Period Weeks    Status On-going      OT SHORT TERM GOAL #5   Title Pt will perform simple warm meal prep and/or light housekeeping with supervision and good safety awareness.    Time 4    Period Weeks    Status On-going               OT Long Term Goals - 03/22/21 1844       OT LONG TERM GOAL #1   Title Pt will be independent with updated HEP for LUE    Time 10    Period Weeks    Status On-going    Target Date 05/12/21      OT LONG TERM GOAL #2   Title Pt will report pain no greater than 6/10 while completing HEP    Time 10    Period Weeks    Status On-going      OT LONG TERM GOAL #3   Title Pt will increase fine motor coordination in LUE by completing 9 hole peg test in 25 seconds  or less.    Baseline R 21.53s, L 32.28s    Time 10    Period Weeks    Status On-going      OT LONG TERM GOAL #4   Title Pt will demonstrate ability to obtain a lightweight  object at 110 degrees shoulder flexion or greater    Baseline 75    Time 10    Period Weeks    Status On-going      OT LONG TERM GOAL #5   Title Pt will increase grip strength in LUE to 30 lbs or greater for increased functional use.    Time 10    Period Weeks    Status On-going                   Plan - 04/01/21 1531     Clinical Impression Statement Pt receptive of feedback for movement patterns. Continue to focus on decreasing guarding and tension in LUE and increase normal movement patterns.    OT Occupational Profile and History Detailed Assessment- Review of Records and additional review of physical, cognitive, psychosocial history related to current functional performance    Occupational performance deficits (Please refer to evaluation for details): ADL's;IADL's    Body Structure / Function / Physical Skills ADL;Decreased knowledge of use of DME;Strength;Dexterity;GMC;Pain;UE functional use;IADL;ROM;Vision;Flexibility;Sensation;FMC;Coordination    Cognitive Skills Attention;Problem Solve;Understand;Emotional    Rehab Potential Good    Clinical Decision Making Several treatment options, min-mod task modification necessary    Comorbidities Affecting Occupational Performance: May have comorbidities impacting occupational performance    Modification or Assistance to Complete Evaluation  Min-Moderate modification of tasks or assist with assess necessary to complete eval    OT Frequency 2x / week    OT Duration Other (comment)   16 visits over 10 weeks for any scheduling conflicts   OT Treatment/Interventions Self-care/ADL training;Moist Heat;Fluidtherapy;DME and/or AE instruction;Splinting;Therapeutic activities;Traction;Aquatic Therapy;Ultrasound;Therapeutic exercise;Cognitive remediation/compensation;Visual/perceptual remediation/compensation;Passive range of motion;Functional Mobility Training;Neuromuscular education;Electrical Stimulation;Energy conservation;Manual  Therapy;Patient/family education    Plan review sleep positions, NMR LUE, aquatics therapy for LUE and balance, simple cooking    OT Home Exercise Plan aquatics for balance and shoulder pain, started with seated self PROM to floor - review at first session    Recommended Other Services seeing PT, ST    Consulted and Agree with Plan of Care Patient;Family member/caregiver    Family Member Consulted daughter, Debe Coder             Patient will benefit from skilled therapeutic intervention in order to improve the following deficits and impairments:   Body Structure / Function / Physical Skills: ADL, Decreased knowledge of use of DME, Strength, Dexterity, GMC, Pain, UE functional use, IADL, ROM, Vision, Flexibility, Sensation, FMC, Coordination Cognitive Skills: Attention, Problem Solve, Understand, Emotional     Visit Diagnosis: 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 (HCC)  Lack of coordination  Stiffness of left shoulder, not elsewhere classified  Attention and concentration deficit  Frontal lobe and executive function deficit  Acute pain of left shoulder  Muscle weakness (generalized)    Problem List Patient Active Problem List   Diagnosis Date Noted   Biceps tendonitis on left 02/10/2021   Vascular headache 08/05/2020   Hyponatremia 07/28/2020   Headache 07/24/2020   Neck pain, musculoskeletal 07/24/2020   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    Subarachnoid bleed ([place]) 07/09/2020   Subarachnoid hemorrhage ([place]) 06/29/2020   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[place]) 03/04/2019  S/P hysterectomy 12/13/2017   Abnormal laboratory test 09/09/2016   Gait abnormality 08/03/2016   Weakness 08/03/2016   Paresthesia 08/03/2016   S/P cervical spinal fusion 10/23/2015   Hypersomnia 12/08/2010   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   EPIGASTRIC PAIN 10/13/2009   IRRITABLE BOWEL SYNDROME 03/10/2009   BACK  PAIN, LUMBAR 07/01/2008   COSTOCHONDRITIS 02/01/2008   VIRAL URI 01/08/2008   CHEST PAIN 01/08/2008   OTHER SPECIFIED EPISODIC MOOD DISORDER 12/25/2007   ARM PAIN 12/25/2007   ANEMIA-NOS 10/02/2007   Essential hypertension 10/02/2007   GERD 10/02/2007   POLYP, GALLBLADDER 10/02/2007   UTERINE POLYP 10/02/2007   OSTEOARTHRITIS 10/02/2007   Dyskinesia 10/02/2007   HEADACHE 10/02/2007   PALPITATIONS 10/02/2007   CARDIAC MURMUR, HX OF 10/02/2007   NEPHROLITHIASIS, HX OF 10/02/2007    Zachery Conch, OT 04/01/2021, 3:37 PM  [facility] [address] [place], [place], [address] Phone: [phone]   Fax:  [phone]  Name: Amy Barry MRN: [id number] Date of Birth: November 23, 1970

## 2021-04-01 NOTE — Patient Instructions (Signed)
Take 3 deep breaths (breathe in through your nose, breathe out through your mouth) if you start to feel overwhelmed or overstimulated. When you feel fatigued and dizziness, start your deep breathing.   Bring portable pill organizer to next ST session    Complete homework for next ST session

## 2021-04-02 NOTE — Therapy (Signed)
[facility] [address] [place], [place], [address] Phone: [phone]   Fax:  [phone]  Speech Language Pathology Treatment  Patient Details  Name: Amy Barry MRN: [id number] Date of Birth: 04-10-1970 Referring Provider (SLP): Alger Simons MD   Encounter Date: 04/01/2021   End of Session - 04/01/21 1548     Visit Number 4    Number of Visits 17    Date for SLP Re-Evaluation 05/03/21    SLP Start Time 60    SLP Stop Time  1705    SLP Time Calculation (min) 50 min    Activity Tolerance Patient tolerated treatment well             Past Medical History:  Diagnosis Date   Anxiety    Arthralgia    Generalized weakness    bilateral upper and lower extremities,  walks with cane   GERD (gastroesophageal reflux disease)    Headache(784.0)    Heart murmur    per pt "since childhood, no problems"   History of kidney stones    Hypertension    followed pcp   IBS (irritable bowel syndrome)    Iron deficiency anemia    12-06-2017 per pt recently had IV iron infusion July 2019   Low vitamin D level    Osteoarthritis    pt unsure about this   PVC (premature ventricular contraction)    per pt had holter monitor   SLE (systemic lupus erythematosus) ([place])    rheumotologist-- dr syed   Stroke [facility]) 06/2020   Wears glasses     Past Surgical History:  Procedure Laterality Date   CERVICAL DISC ARTHROPLASTY N/A 10/23/2015   Procedure: Cervical Disc Arthroplasty Cervical six-seven;  Surgeon: Eustace Moore, MD;  Location: [facility];  Service: Neurosurgery;  Laterality: N/A;   D & C HYSTEROSCOPY W/ RESECTION ENDOMETRIAL POLYP  10-13-2003    dr rivard @[facility]    ESOPHAGOGASTRODUODENOSCOPY     for GERD   IR ANGIO INTRA EXTRACRAN SEL COM CAROTID INNOMINATE BILAT MOD SED  07/06/2020   IR ANGIO INTRA EXTRACRAN SEL INTERNAL CAROTID BILAT MOD SED  06/29/2020   IR ANGIO VERTEBRAL SEL VERTEBRAL BILAT MOD SED  06/29/2020    IR ANGIO VERTEBRAL SEL VERTEBRAL BILAT MOD SED  07/06/2020   LAPAROSCOPIC ABDOMINAL EXPLORATION  1996   dx ibs   OVARIAN CYST SURGERY  age 43   x2 cyst   RADIOLOGY WITH ANESTHESIA N/A 06/29/2020   Procedure: IR WITH ANESTHESIA;  Surgeon: Radiologist, Medication, MD;  Location: [facility];  Service: Radiology;  Laterality: N/A;   ROBOTIC ASSISTED TOTAL HYSTERECTOMY N/A 12/13/2017   Procedure: XI ROBOTIC ASSISTED TOTAL HYSTERECTOMYWITH BILATERAL SALPINGECTOMY;  Surgeon: Christophe Louis, MD;  Location: [facility];  Service: Gynecology;  Laterality: N/A;   TUBAL LIGATION Bilateral 06-01-2002   dr Mancel Bale @[facility]    PPTL    There were no vitals filed for this visit.   Subjective Assessment - 04/01/21 1617     Subjective "bunch...of..um...walking"    Patient is accompained by: Family member   daughter, Debe Coder   Currently in Pain? Yes    Pain Score 6     Pain Location Shoulder                   ADULT SLP TREATMENT - 04/01/21 1547       General Information   Behavior/Cognition Alert;Cooperative;Pleasant mood      Treatment Provided   Treatment provided Cognitive-Linquistic  Cognitive-Linquistic Treatment   Treatment focused on Cognition;Patient/family/caregiver education    Skilled Treatment Pt returned with binder. Calendar dates crossed off on calendar as recommended. Pt plans to to manage family calendar at home starting in January. SLP recommended patient re-visit calendar multiple times per day for repetition as pt reports reduced recall of specific information on calendar. Pt has been using an alarm for her medication, which is successsful for morning/evening. Her daughter indicated occasional missed dosages for afternoon and nighttime. SLP targeted functional problem solving, in which SLP suggested shifting the times earlier due to patient preference for early bedtime. Family will bring in portable pill box used at home as pt reports some confusion and reduced orientation. Usual halting and  pausing noted in conversation, in which pt endorsed word finding difficulty (may be related to recall). Anxiety also indicated, in which SLP re-educated recommended strategies to reduce anxious behaviors in stimulating environments.      Assessment / Recommendations / Plan   Plan Continue with current plan of care      Progression Toward Goals   Progression toward goals Progressing toward goals              SLP Education - 04/02/21 0747     Education Details HEP, pill organizer    Person(s) Educated Patient;Child(ren)    Methods Explanation;Demonstration;Handout    Comprehension Verbalized understanding;Returned demonstration;Need further instruction              SLP Short Term Goals - 04/01/21 1548       SLP SHORT TERM GOAL #1   Title Complete CLQT to determine extent of cognitive deficits.    Time 2    Period Weeks    Status On-going      SLP SHORT TERM GOAL #2   Title Pt will manage her calendar and appointments with rare min A from family over 2 weeks    Time 3    Period Weeks    Status On-going      SLP SHORT TERM GOAL #3   Title Pt will sort pills into organizer independently with supervision cues from family    Time 3    Period Weeks    Status On-going      SLP SHORT TERM GOAL #4   Title Pt will pay 3 bills with occasional min A from family    Time 3    Period Weeks    Status On-going      SLP SHORT TERM GOAL #5   Title Pt will carryover 3 compensatory strategies to complete 2 IADL's with occasional min A from family    Time 3    Period Weeks    Status On-going              SLP Long Term Goals - 04/01/21 1549       SLP LONG TERM GOAL #1   Title Pt will independently manage her daily schedule, appointments, to do lists over 1 week    Time 7    Period Weeks    Status On-going      SLP LONG TERM GOAL #2   Title Pt will use compensations for organization and attention to pay 5 bills with rare min A from family    Time 7    Period Weeks     Status On-going      SLP LONG TERM GOAL #3   Title Pt will be independent in medication management with no A from family  Time 7    Period Weeks    Status On-going      SLP LONG TERM GOAL #4   Title Pt will improve score on NeuroQOL Cognitive Function PROM (initial score 39) by 4 points    Time 7    Period Weeks    Status On-going      SLP LONG TERM GOAL #5   Title Pt will carryover compensatory strategies for alternating attention in cooking and IADL's with rare min A from family    Time 7    Period Weeks    Status On-going              Plan - 04/01/21 1548     Clinical Impression Statement Slow processsing and reduced memory persist. Affect somewhat better today. No episodes of zoning out. Initated strategies for medication management and recall of appointments. Trained caregiver in [place] with pill Chief [employer] and alarms. Continuing CLQT subtests as anxiety permits. Continue skilled ST to maxmize safety, independence, and return to PLOF.    Speech Therapy Frequency 2x / week    Duration 8 weeks    Treatment/Interventions Compensatory strategies;Cueing hierarchy;Functional tasks;Patient/family education;Diet toleration management by SLP;Environmental controls;Cognitive reorganization;Multimodal communcation approach;Compensatory techniques;Internal/external aids;SLP instruction and feedback    Potential to [place]             Patient will benefit from skilled therapeutic intervention in order to improve the following deficits and impairments:   Cognitive communication deficit    Problem List Patient Active Problem List   Diagnosis Date Noted   Biceps tendonitis on left 02/10/2021   Vascular headache 08/05/2020   Hyponatremia 07/28/2020   Headache 07/24/2020   Neck pain, musculoskeletal 07/24/2020   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    Subarachnoid bleed ([place]) 07/09/2020   Subarachnoid hemorrhage ([place])  06/29/2020   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[place]) 03/04/2019   S/P hysterectomy 12/13/2017   Abnormal laboratory test 09/09/2016   Gait abnormality 08/03/2016   Weakness 08/03/2016   Paresthesia 08/03/2016   S/P cervical spinal fusion 10/23/2015   Hypersomnia 12/08/2010   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   EPIGASTRIC PAIN 10/13/2009   IRRITABLE BOWEL SYNDROME 03/10/2009   BACK PAIN, LUMBAR 07/01/2008   COSTOCHONDRITIS 02/01/2008   VIRAL URI 01/08/2008   CHEST PAIN 01/08/2008   OTHER SPECIFIED EPISODIC MOOD DISORDER 12/25/2007   ARM PAIN 12/25/2007   ANEMIA-NOS 10/02/2007   Essential hypertension 10/02/2007   GERD 10/02/2007   POLYP, GALLBLADDER 10/02/2007   UTERINE POLYP 10/02/2007   OSTEOARTHRITIS 10/02/2007   Dyskinesia 10/02/2007   HEADACHE 10/02/2007   PALPITATIONS 10/02/2007   CARDIAC MURMUR, HX OF 10/02/2007   NEPHROLITHIASIS, HX OF 10/02/2007    Alinda Deem, CCC-SLP 04/02/2021, 7:48 AM  [facility] [address] [place], [place], [address] Phone: [phone]   Fax:  [phone]   Name: STEFANNIE DEFEO MRN: [id number] Date of Birth: March 08, 1971

## 2021-04-06 ENCOUNTER — Ambulatory Visit: Payer: No Typology Code available for payment source

## 2021-04-06 ENCOUNTER — Other Ambulatory Visit: Payer: Self-pay

## 2021-04-06 ENCOUNTER — Encounter: Payer: Self-pay | Admitting: Occupational Therapy

## 2021-04-06 ENCOUNTER — Ambulatory Visit: Payer: No Typology Code available for payment source | Attending: Family Medicine | Admitting: Occupational Therapy

## 2021-04-06 DIAGNOSIS — M6281 Muscle weakness (generalized): Secondary | ICD-10-CM

## 2021-04-06 DIAGNOSIS — R4701 Aphasia: Secondary | ICD-10-CM | POA: Insufficient documentation

## 2021-04-06 DIAGNOSIS — R41841 Cognitive communication deficit: Secondary | ICD-10-CM | POA: Insufficient documentation

## 2021-04-06 DIAGNOSIS — I69054 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: Secondary | ICD-10-CM | POA: Diagnosis present

## 2021-04-06 DIAGNOSIS — M25512 Pain in left shoulder: Secondary | ICD-10-CM | POA: Diagnosis present

## 2021-04-06 DIAGNOSIS — M25612 Stiffness of left shoulder, not elsewhere classified: Secondary | ICD-10-CM

## 2021-04-06 DIAGNOSIS — R41840 Attention and concentration deficit: Secondary | ICD-10-CM

## 2021-04-06 DIAGNOSIS — R2689 Other abnormalities of gait and mobility: Secondary | ICD-10-CM | POA: Insufficient documentation

## 2021-04-06 DIAGNOSIS — R41844 Frontal lobe and executive function deficit: Secondary | ICD-10-CM | POA: Diagnosis present

## 2021-04-06 DIAGNOSIS — R279 Unspecified lack of coordination: Secondary | ICD-10-CM | POA: Diagnosis present

## 2021-04-06 NOTE — Therapy (Signed)
[facility] [address] [place], [place], [address] Phone: [phone]   Fax:  [phone]  Speech Language Pathology Treatment  Patient Details  Name: Amy Barry MRN: [id number] Date of Birth: 1970-07-26 Referring Provider (SLP): Alger Simons MD   Encounter Date: 04/06/2021   End of Session - 04/06/21 1313     Visit Number 5    Number of Visits 17    Date for SLP Re-Evaluation 05/03/21    SLP Start Time 1017    SLP Stop Time  1100    SLP Time Calculation (min) 43 min    Activity Tolerance Patient tolerated treatment well             Past Medical History:  Diagnosis Date   Anxiety    Arthralgia    Generalized weakness    bilateral upper and lower extremities,  walks with cane   GERD (gastroesophageal reflux disease)    Headache(784.0)    Heart murmur    per pt "since childhood, no problems"   History of kidney stones    Hypertension    followed pcp   IBS (irritable bowel syndrome)    Iron deficiency anemia    12-06-2017 per pt recently had IV iron infusion July 2019   Low vitamin D level    Osteoarthritis    pt unsure about this   PVC (premature ventricular contraction)    per pt had holter monitor   SLE (systemic lupus erythematosus) ([place])    rheumotologist-- dr syed   Stroke [facility]) 06/2020   Wears glasses     Past Surgical History:  Procedure Laterality Date   CERVICAL DISC ARTHROPLASTY N/A 10/23/2015   Procedure: Cervical Disc Arthroplasty Cervical six-seven;  Surgeon: Eustace Moore, MD;  Location: [facility];  Service: Neurosurgery;  Laterality: N/A;   D & C HYSTEROSCOPY W/ RESECTION ENDOMETRIAL POLYP  10-13-2003    dr rivard @[facility]    ESOPHAGOGASTRODUODENOSCOPY     for GERD   IR ANGIO INTRA EXTRACRAN SEL COM CAROTID INNOMINATE BILAT MOD SED  07/06/2020   IR ANGIO INTRA EXTRACRAN SEL INTERNAL CAROTID BILAT MOD SED  06/29/2020   IR ANGIO VERTEBRAL SEL VERTEBRAL BILAT MOD SED  06/29/2020    IR ANGIO VERTEBRAL SEL VERTEBRAL BILAT MOD SED  07/06/2020   LAPAROSCOPIC ABDOMINAL EXPLORATION  1996   dx ibs   OVARIAN CYST SURGERY  age 67   x2 cyst   RADIOLOGY WITH ANESTHESIA N/A 06/29/2020   Procedure: IR WITH ANESTHESIA;  Surgeon: Radiologist, Medication, MD;  Location: [facility];  Service: Radiology;  Laterality: N/A;   ROBOTIC ASSISTED TOTAL HYSTERECTOMY N/A 12/13/2017   Procedure: XI ROBOTIC ASSISTED TOTAL HYSTERECTOMYWITH BILATERAL SALPINGECTOMY;  Surgeon: Christophe Louis, MD;  Location[facility];  Service: Gynecology;  Laterality: N/A;   TUBAL LIGATION Bilateral 06-01-2002   dr Mancel Bale @[facility]    PPTL    There were no vitals filed for this visit.   Subjective Assessment - 04/06/21 1019     Subjective "I'm tired"    Currently in Pain? Yes    Pain Score 7     Pain Location Shoulder                   ADULT SLP TREATMENT - 04/06/21 1019       General Information   Behavior/Cognition Alert;Cooperative;Pleasant mood;Lethargic      Treatment Provided   Treatment provided Cognitive-Linquistic      Cognitive-Linquistic Treatment   Treatment focused  on Cognition;Patient/family/caregiver education    Skilled Treatment Pt endorsed feeling tired today. Pt set alarm for new year to wake up, which was effective. Pill boxes provided today for SLP to assess and aid problem solving for improved medication management. For last two bedtime slots, pt did not accurately take medications. With further questioning and visual cues, it was determined pt took pill from wrong date x1. Pt became upset when analyzing pill box but was easily redirected with positive encouragement. SLP generated strategy to cover previous days with sticky notes, confirm current date/DOW via phone or calendar, modify alarm sounds to be distinct, and possibly shift schedule for earlier start times due to early bedtime. Pt verbalized understanding and agreement with SLP recommendations.      Assessment / Recommendations / Plan    Plan Continue with current plan of care      Progression Toward Goals   Progression toward goals Progressing toward goals              SLP Education - 04/06/21 1313     Education Details medication management recs    Person(s) Educated Patient    Methods Demonstration;Explanation;Handout;Verbal cues    Comprehension Verbalized understanding;Returned demonstration;Need further instruction              SLP Short Term Goals - 04/06/21 1313       SLP SHORT TERM GOAL #1   Title Complete CLQT to determine extent of cognitive deficits.    Time 2    Period Weeks    Status On-going      SLP SHORT TERM GOAL #2   Title Pt will manage her calendar and appointments with rare min A from family over 2 weeks    Time 2    Period Weeks    Status On-going      SLP SHORT TERM GOAL #3   Title Pt will sort pills into organizer independently with supervision cues from family    Time 2    Period Weeks    Status On-going      SLP SHORT TERM GOAL #4   Title Pt will pay 3 bills with occasional min A from family    Time 2    Period Weeks    Status On-going      SLP SHORT TERM GOAL #5   Title Pt will carryover 3 compensatory strategies to complete 2 IADL's with occasional min A from family    Time 2    Period Weeks    Status On-going              SLP Long Term Goals - 04/06/21 1314       SLP LONG TERM GOAL #1   Title Pt will independently manage her daily schedule, appointments, to do lists over 1 week    Time 6    Period Weeks    Status On-going      SLP LONG TERM GOAL #2   Title Pt will use compensations for organization and attention to pay 5 bills with rare min A from family    Time 6    Period Weeks    Status On-going      SLP LONG TERM GOAL #3   Title Pt will be independent in medication management with no A from family    Time 6    Period Weeks    Status On-going      SLP LONG TERM GOAL #4   Title Pt will improve score on  NeuroQOL Cognitive Function PROM  (initial score 39) by 4 points    Time 6    Period Weeks    Status On-going      SLP LONG TERM GOAL #5   Title Pt will carryover compensatory strategies for alternating attention in cooking and IADL's with rare min A from family    Time 6    Period Weeks    Status On-going              Plan - 04/06/21 1313     Clinical Impression Statement Slow processsing and reduced memory persist. No episodes of zoning out. Conducted ongoing education and training of compensatory strategies for medication management and recall of medication times. Handout provided for caregiver to support Amy Barry with pill box organizer and alarms. Will continue CLQT subtests as anxiety permits. Continue skilled ST to maxmize safety, independence, and return to PLOF.    Speech Therapy Frequency 2x / week    Duration 8 weeks    Treatment/Interventions Compensatory strategies;Cueing hierarchy;Functional tasks;Patient/family education;Diet toleration management by SLP;Environmental controls;Cognitive reorganization;Multimodal communcation approach;Compensatory techniques;Internal/external aids;SLP instruction and feedback    Potential to [place]             Patient will benefit from skilled therapeutic intervention in order to improve the following deficits and impairments:   Cognitive communication deficit    Problem List Patient Active Problem List   Diagnosis Date Noted   Biceps tendonitis on left 02/10/2021   Vascular headache 08/05/2020   Hyponatremia 07/28/2020   Headache 07/24/2020   Neck pain, musculoskeletal 07/24/2020   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    Subarachnoid bleed ([place]) 07/09/2020   Subarachnoid hemorrhage ([place]) 06/29/2020   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[place]) 03/04/2019   S/P hysterectomy 12/13/2017   Abnormal laboratory test 09/09/2016   Gait abnormality 08/03/2016   Weakness 08/03/2016   Paresthesia 08/03/2016   S/P cervical spinal fusion 10/23/2015    Hypersomnia 12/08/2010   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   EPIGASTRIC PAIN 10/13/2009   IRRITABLE BOWEL SYNDROME 03/10/2009   BACK PAIN, LUMBAR 07/01/2008   COSTOCHONDRITIS 02/01/2008   VIRAL URI 01/08/2008   CHEST PAIN 01/08/2008   OTHER SPECIFIED EPISODIC MOOD DISORDER 12/25/2007   ARM PAIN 12/25/2007   ANEMIA-NOS 10/02/2007   Essential hypertension 10/02/2007   GERD 10/02/2007   POLYP, GALLBLADDER 10/02/2007   UTERINE POLYP 10/02/2007   OSTEOARTHRITIS 10/02/2007   Dyskinesia 10/02/2007   HEADACHE 10/02/2007   PALPITATIONS 10/02/2007   CARDIAC MURMUR, HX OF 10/02/2007   NEPHROLITHIASIS, HX OF 10/02/2007    Alinda Deem, CCC-SLP 04/06/2021, 3:07 PM  [facility] [address] [place], [place], [address] Phone: [phone]   Fax:  [phone]   Name: Amy Barry MRN: [id number] Date of Birth: 17-May-1970

## 2021-04-06 NOTE — Patient Instructions (Addendum)
Consider different medication times to help you get those bedtime medications taken consistently. Possible schedule changes (discuss with family): Morning= 7:30 am  Or   6:30 am  Midday= 12:30 pm  Or  11:30 am Evening=  4:30 pm  Or  3:30 pm  Bedtime=  7:30 pm  Or   6:30 pm  We put sticky notes over the previous days on the pill box to help you attend to the right date.    Look at your calendar/phone prior to selecting the medication date if you don't remember the day of the week.    We changed the alarm sounds to see if that is helpful to get your attention to take your midday/bedtime medications.

## 2021-04-06 NOTE — Therapy (Signed)
[facility] [address] [place], [place], [address] Phone: [phone]   Fax:  [phone]  Occupational Therapy Treatment and Progress Update  Patient Details  Name: Amy Barry MRN: [id number] Date of Birth: [birth year] Referring Provider (OT): Oval Linsey, MD   Encounter Date: 04/06/2021   OT End of Session - 04/06/21 1211     Visit Number 9    Number of Visits 17    Date for OT Re-Evaluation 05/12/21    Authorization Type [employer]    Authorization Time Period 120 visits PT/OT, 120 visits ST    Authorization - Number of Visits 22    OT Start Time 1100    OT Stop Time 1145    OT Time Calculation (min) 45 min    Activity Tolerance Patient tolerated treatment well    Behavior During Therapy WFL for tasks assessed/performed             Past Medical History:  Diagnosis Date   Anxiety    Arthralgia    Generalized weakness    bilateral upper and lower extremities,  walks with cane   GERD (gastroesophageal reflux disease)    Headache(784.0)    Heart murmur    per pt "since childhood, no problems"   History of kidney stones    Hypertension    followed pcp   IBS (irritable bowel syndrome)    Iron deficiency anemia    12-06-2017 per pt recently had IV iron infusion July 2019   Low vitamin D level    Osteoarthritis    pt unsure about this   PVC (premature ventricular contraction)    per pt had holter monitor   SLE (systemic lupus erythematosus) ([place])    rheumotologist-- dr syed   Stroke [facility]) 06/2020   Wears glasses     Past Surgical History:  Procedure Laterality Date   CERVICAL DISC ARTHROPLASTY N/A 10/23/2015   Procedure: Cervical Disc Arthroplasty Cervical six-seven;  Surgeon: Eustace Moore, MD;  Location: [facility];  Service: Neurosurgery;  Laterality: N/A;   D & C HYSTEROSCOPY W/ RESECTION ENDOMETRIAL POLYP  10-13-2003    dr rivard '@[facility]'    ESOPHAGOGASTRODUODENOSCOPY     for GERD   IR ANGIO INTRA  EXTRACRAN SEL COM CAROTID INNOMINATE BILAT MOD SED  07/06/2020   IR ANGIO INTRA EXTRACRAN SEL INTERNAL CAROTID BILAT MOD SED  06/29/2020   IR ANGIO VERTEBRAL SEL VERTEBRAL BILAT MOD SED  06/29/2020   IR ANGIO VERTEBRAL SEL VERTEBRAL BILAT MOD SED  07/06/2020   LAPAROSCOPIC ABDOMINAL EXPLORATION  1996   dx ibs   OVARIAN CYST SURGERY  age 51   x2 cyst   RADIOLOGY WITH ANESTHESIA N/A 06/29/2020   Procedure: IR WITH ANESTHESIA;  Surgeon: Radiologist, Medication, MD;  Location: [place];  Service: Radiology;  Laterality: N/A;   ROBOTIC ASSISTED TOTAL HYSTERECTOMY N/A 12/13/2017   Procedure: XI ROBOTIC ASSISTED TOTAL HYSTERECTOMYWITH BILATERAL SALPINGECTOMY;  Surgeon: Christophe Louis, MD;  Location: [facility];  Service: Gynecology;  Laterality: N/A;   TUBAL LIGATION Bilateral 06-01-2002   dr Mancel Bale '@[facility]'    PPTL    There were no vitals filed for this visit.   Subjective Assessment - 04/06/21 1155     Subjective  Im probably about a 7 (anterior shoulder humerus)                          OT Treatments/Exercises (OP) - 04/06/21 0001  ADLs   LB Dressing Worked on doffing and donning shoes using BUE.  Patient initially hesitant to utilze LUE.  With encouragement able to use BUE to don shoes - using a foot stool to elevate level of foot - reduce bending.    ADL Comments Reviewed all short term goals. Patient has met these goals.  Reports starting to complete simple cooking and housekeeping tasks.  Reports her daughter is not helping her dress, but her husband will help her.  Encouraged her to continue to work for independence.      Neurological Re-education Exercises   Other Exercises 1 Realignment of left shoulder girdle to work on coodination of shoulder,head, and trunk with weight shift toward left side.  Patient had immediate reduction in shoulder pain with realignment.  Patient encouraged to utilize strategies of lifting chest, dropping shoulder and tipping head toward left to reduce pull on  left shoulder girdle.                    OT Education - 04/06/21 1211     Education Details increase functional use of LUE    Person(s) Educated Patient    Methods Explanation;Demonstration;Verbal cues;Tactile cues    Comprehension Need further instruction              OT Short Term Goals - 04/06/21 1107       OT SHORT TERM GOAL #1   Title Pt will be independent with initial HEP for LUE    Time 4    Period Weeks    Status Achieved      OT SHORT TERM GOAL #2   Title Pt will perform UB dressing with pain no greater than 7/10 in LUE shoulder and with no physical assistance    Time 4    Status Achieved      OT SHORT TERM GOAL #3   Title Pt will achieve 90 degrees of AROM shoulder flexion with LUE in order to progress to using LUE for functional reaching activities    Baseline 75* AROM - limited by pain    Time 4    Period Weeks    Status Achieved      OT SHORT TERM GOAL #4   Title Pt will increase grip strength in LUE by 5lbs or greater.    Baseline L 19.6, R 39.4,      1/3 - L 28.8!, R 52.6    Time 4    Period Weeks    Status Achieved      OT SHORT TERM GOAL #5   Title Pt will perform simple warm meal prep and/or light housekeeping with supervision and good safety awareness.    Baseline 1/3 reports helping daughter make a salad, and she reports doing laundry    Time 4    Period Weeks    Status Achieved               OT Long Term Goals - 04/06/21 1119       OT LONG TERM GOAL #1   Title Pt will be independent with updated HEP for LUE    Time 10    Period Weeks    Status On-going    Target Date 05/12/21      OT LONG TERM GOAL #2   Title Pt will report pain no greater than 6/10 while completing HEP    Time 10    Period Weeks    Status On-going  OT LONG TERM GOAL #3   Title Pt will increase fine motor coordination in LUE by completing 9 hole peg test in 25 seconds or less.    Baseline R 21.53s, L 32.28s    Time 10    Period Weeks     Status On-going      OT LONG TERM GOAL #4   Title Pt will demonstrate ability to obtain a lightweight object at 110 degrees shoulder flexion or greater    Baseline 75    Time 10    Period Weeks    Status On-going      OT LONG TERM GOAL #5   Title Pt will increase grip strength in LUE to 30 lbs or greater for increased functional use.    Time 10    Period Weeks    Status On-going                   Plan - 04/06/21 1212     Clinical Impression Statement This progress report covers dates of service from 03/03/21-04/06/21.  Patient has shown slow progress in reduction of left arm pain, but has gradually returned to participation in basic self care activities.  Patient has met short teram goals, and is working toward long term goals.  Patient has had one session of aquatic therapy to address balance and left shoulder range of motion and reduction in pain.    OT Occupational Profile and History Detailed Assessment- Review of Records and additional review of physical, cognitive, psychosocial history related to current functional performance    Occupational performance deficits (Please refer to evaluation for details): ADL's;IADL's    Body Structure / Function / Physical Skills ADL;Decreased knowledge of use of DME;Strength;Dexterity;GMC;Pain;UE functional use;IADL;ROM;Vision;Flexibility;Sensation;FMC;Coordination    Cognitive Skills Attention;Problem Solve;Understand;Emotional    Rehab Potential Good    Clinical Decision Making Several treatment options, min-mod task modification necessary    Comorbidities Affecting Occupational Performance: May have comorbidities impacting occupational performance    Modification or Assistance to Complete Evaluation  Min-Moderate modification of tasks or assist with assess necessary to complete eval    OT Frequency 2x / week    OT Duration Other (comment)   16 visits over 10 weeks for any scheduling conflicts   OT Treatment/Interventions  Self-care/ADL training;Moist Heat;Fluidtherapy;DME and/or AE instruction;Splinting;Therapeutic activities;Traction;Aquatic Therapy;Ultrasound;Therapeutic exercise;Cognitive remediation/compensation;Visual/perceptual remediation/compensation;Passive range of motion;Functional Mobility Training;Neuromuscular education;Electrical Stimulation;Energy conservation;Manual Therapy;Patient/family education    Plan review sleep positions, NMR LUE, aquatics therapy for LUE and balance, simple cooking    OT Home Exercise Plan aquatics for balance and shoulder pain, started with seated self PROM to floor - review at first session    Recommended Other Services seeing PT, ST    Consulted and Agree with Plan of Care Patient             Patient will benefit from skilled therapeutic intervention in order to improve the following deficits and impairments:   Body Structure / Function / Physical Skills: ADL, Decreased knowledge of use of DME, Strength, Dexterity, GMC, Pain, UE functional use, IADL, ROM, Vision, Flexibility, Sensation, FMC, Coordination Cognitive Skills: Attention, Problem Solve, Understand, Emotional     Visit Diagnosis: 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) 06/29/2020   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[place]) 03/04/2019   S/P hysterectomy 12/13/2017    Abnormal laboratory test 09/09/2016   Gait abnormality 08/03/2016   Weakness 08/03/2016   Paresthesia 08/03/2016   S/P cervical spinal fusion 10/23/2015   Hypersomnia 12/08/2010   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   EPIGASTRIC PAIN 10/13/2009   IRRITABLE BOWEL SYNDROME 03/10/2009   BACK PAIN, LUMBAR 07/01/2008   COSTOCHONDRITIS 02/01/2008   VIRAL URI 01/08/2008   CHEST PAIN 01/08/2008   OTHER SPECIFIED EPISODIC MOOD DISORDER 12/25/2007   ARM PAIN 12/25/2007   ANEMIA-NOS 10/02/2007   Essential hypertension 10/02/2007   GERD 10/02/2007   POLYP, GALLBLADDER 10/02/2007   UTERINE POLYP 10/02/2007   OSTEOARTHRITIS 10/02/2007   Dyskinesia 10/02/2007   HEADACHE 10/02/2007   PALPITATIONS 10/02/2007   CARDIAC MURMUR, HX OF 10/02/2007   NEPHROLITHIASIS, HX OF 10/02/2007    Mariah Milling, OT 04/06/2021, 12:17 PM  [facility] [address] [place], [place], [address] Phone: [phone]   Fax:  [phone]  Name: DONIA YOKUM MRN: [id number] Date of Birth: [birth year]

## 2021-04-08 ENCOUNTER — Other Ambulatory Visit: Payer: Self-pay

## 2021-04-08 ENCOUNTER — Ambulatory Visit: Payer: No Typology Code available for payment source | Admitting: Occupational Therapy

## 2021-04-08 ENCOUNTER — Ambulatory Visit: Payer: No Typology Code available for payment source

## 2021-04-08 ENCOUNTER — Encounter: Payer: Self-pay | Admitting: Occupational Therapy

## 2021-04-08 VITALS — BP 122/72

## 2021-04-08 DIAGNOSIS — R41841 Cognitive communication deficit: Secondary | ICD-10-CM

## 2021-04-08 DIAGNOSIS — R2689 Other abnormalities of gait and mobility: Secondary | ICD-10-CM

## 2021-04-08 DIAGNOSIS — M6281 Muscle weakness (generalized): Secondary | ICD-10-CM

## 2021-04-08 DIAGNOSIS — R279 Unspecified lack of coordination: Secondary | ICD-10-CM

## 2021-04-08 DIAGNOSIS — R41840 Attention and concentration deficit: Secondary | ICD-10-CM

## 2021-04-08 DIAGNOSIS — M25512 Pain in left shoulder: Secondary | ICD-10-CM

## 2021-04-08 DIAGNOSIS — I69054 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: Secondary | ICD-10-CM | POA: Diagnosis not present

## 2021-04-08 DIAGNOSIS — M25612 Stiffness of left shoulder, not elsewhere classified: Secondary | ICD-10-CM

## 2021-04-08 DIAGNOSIS — R41844 Frontal lobe and executive function deficit: Secondary | ICD-10-CM

## 2021-04-08 NOTE — Therapy (Signed)
[facility] [address] [place], [place], [address] Phone: [phone]   Fax:  [phone]  Occupational Therapy Treatment  Patient Details  Name: Amy Barry MRN: [id number] Date of Birth: 24-May-1970 Referring Provider (OT): Oval Linsey, MD   Encounter Date: 04/08/2021   OT End of Session - 04/08/21 1110     Visit Number 10    Number of Visits 17    Date for OT Re-Evaluation 05/12/21    Authorization Type [employer]et[employer]    Authorization Time Period 120 visits PT/OT, 120 visits ST    Authorization - Number of Visits 41    OT Start Time 1107    OT Stop Time 1145    OT Time Calculation (min) 38 min    Activity Tolerance Patient tolerated treatment well    Behavior During Therapy WFL for tasks assessed/performed             Past Medical History:  Diagnosis Date   Anxiety    Arthralgia    Generalized weakness    bilateral upper and lower extremities,  walks with cane   GERD (gastroesophageal reflux disease)    Headache(784.0)    Heart murmur    per pt "since childhood, no problems"   History of kidney stones    Hypertension    followed pcp   IBS (irritable bowel syndrome)    Iron deficiency anemia    12-06-2017 per pt recently had IV iron infusion July 2019   Low vitamin D level    Osteoarthritis    pt unsure about this   PVC (premature ventricular contraction)    per pt had holter monitor   SLE (systemic lupus erythematosus) ([place])    rheumotologist-- dr syed   Stroke [facility]) 06/2020   Wears glasses     Past Surgical History:  Procedure Laterality Date   CERVICAL DISC ARTHROPLASTY N/A 10/23/2015   Procedure: Cervical Disc Arthroplasty Cervical six-seven;  Surgeon: Eustace Moore, MD;  Location: [facility];  Service: Neurosurgery;  Laterality: N/A;   D & C HYSTEROSCOPY W/ RESECTION ENDOMETRIAL POLYP  10-13-2003    dr rivard @[facility]    ESOPHAGOGASTRODUODENOSCOPY     for GERD   IR ANGIO INTRA EXTRACRAN SEL COM  CAROTID INNOMINATE BILAT MOD SED  07/06/2020   IR ANGIO INTRA EXTRACRAN SEL INTERNAL CAROTID BILAT MOD SED  06/29/2020   IR ANGIO VERTEBRAL SEL VERTEBRAL BILAT MOD SED  06/29/2020   IR ANGIO VERTEBRAL SEL VERTEBRAL BILAT MOD SED  07/06/2020   LAPAROSCOPIC ABDOMINAL EXPLORATION  1996   dx ibs   OVARIAN CYST SURGERY  age 20   x2 cyst   RADIOLOGY WITH ANESTHESIA N/A 06/29/2020   Procedure: IR WITH ANESTHESIA;  Surgeon: Radiologist, Medication, MD;  Location: [facility];  Service: Radiology;  Laterality: N/A;   ROBOTIC ASSISTED TOTAL HYSTERECTOMY N/A 12/13/2017   Procedure: XI ROBOTIC ASSISTED TOTAL HYSTERECTOMYWITH BILATERAL SALPINGECTOMY;  Surgeon: Christophe Louis, MD;  Location: [facility];  Service: Gynecology;  Laterality: N/A;   TUBAL LIGATION Bilateral 06-01-2002   dr Mancel Bale @[facility]    PPTL    There were no vitals filed for this visit.   Subjective Assessment - 04/08/21 1110     Subjective  "It's hurting but not extruciating"    Pertinent History PMH: HTN, IBS, anemia, connective tissue disorder, family history of cerebral aneurysms    Limitations Fall Risk, L shoulder pain, L hemi    Currently in Pain? Yes    Pain Score  6     Pain Location Shoulder    Pain Orientation Left    Pain Descriptors / Indicators Aching    Pain Type Chronic pain    Pain Onset More than a month ago    Pain Frequency Constant                          OT Treatments/Exercises (OP) - 04/08/21 0001       ADLs   Grooming pt reports styling her own hair today with needing assistance for parting her hair from her daughter. Pt worried about not being able to reach the back of her head d/t limited shoulder range of motion. Encouraged patient to continue trying to do so and continue to use the LUE for more functioanl tasks like this for increasing range of motion    Home Maintenance folding laundry in standing. pt stood for approximtely 5 minutes and folded 4 items beore needing seated rest brek. Pt was reliant on  rolling [place] for stability frequently during standing task.                      OT Short Term Goals - 04/06/21 1107       OT SHORT TERM GOAL #1   Title Pt will be independent with initial HEP for LUE    Time 4    Period Weeks    Status Achieved      OT SHORT TERM GOAL #2   Title Pt will perform UB dressing with pain no greater than 7/10 in LUE shoulder and with no physical assistance    Time 4    Status Achieved      OT SHORT TERM GOAL #3   Title Pt will achieve 90 degrees of AROM shoulder flexion with LUE in order to progress to using LUE for functional reaching activities    Baseline 75* AROM - limited by pain    Time 4    Period Weeks    Status Achieved      OT SHORT TERM GOAL #4   Title Pt will increase grip strength in LUE by 5lbs or greater.    Baseline L 19.6, R 39.4,      1/3 - L 28.8!, R 52.6    Time 4    Period Weeks    Status Achieved      OT SHORT TERM GOAL #5   Title Pt will perform simple warm meal prep and/or light housekeeping with supervision and good safety awareness.    Baseline 1/3 reports helping daughter make a salad, and she reports doing laundry    Time 4    Period Weeks    Status Achieved               OT Long Term Goals - 04/06/21 1119       OT LONG TERM GOAL #1   Title Pt will be independent with updated HEP for LUE    Time 10    Period Weeks    Status On-going    Target Date 05/12/21      OT LONG TERM GOAL #2   Title Pt will report pain no greater than 6/10 while completing HEP    Time 10    Period Weeks    Status On-going      OT LONG TERM GOAL #3   Title Pt will increase fine motor coordination in LUE by completing 9 hole peg  test in 25 seconds or less.    Baseline R 21.53s, L 32.28s    Time 10    Period Weeks    Status On-going      OT LONG TERM GOAL #4   Title Pt will demonstrate ability to obtain a lightweight object at 110 degrees shoulder flexion or greater    Baseline 75    Time 10    Period  Weeks    Status On-going      OT LONG TERM GOAL #5   Title Pt will increase grip strength in LUE to 30 lbs or greater for increased functional use.    Time 10    Period Weeks    Status On-going                   Plan - 04/08/21 1135     Clinical Impression Statement Pt continues with slow and stead progress towards increaed range of motion and decrease pain in LUE however increasing independence and ability to complete more functional tasks    OT Occupational Profile and History Detailed Assessment- Review of Records and additional review of physical, cognitive, psychosocial history related to current functional performance    Occupational performance deficits (Please refer to evaluation for details): ADL's;IADL's    Body Structure / Function / Physical Skills ADL;Decreased knowledge of use of DME;Strength;Dexterity;GMC;Pain;UE functional use;IADL;ROM;Vision;Flexibility;Sensation;FMC;Coordination    Cognitive Skills Attention;Problem Solve;Understand;Emotional    Rehab Potential Good    Clinical Decision Making Several treatment options, min-mod task modification necessary    Comorbidities Affecting Occupational Performance: May have comorbidities impacting occupational performance    Modification or Assistance to Complete Evaluation  Min-Moderate modification of tasks or assist with assess necessary to complete eval    OT Frequency 2x / week    OT Duration Other (comment)   16 visits over 10 weeks for any scheduling conflicts   OT Treatment/Interventions Self-care/ADL training;Moist Heat;Fluidtherapy;DME and/or AE instruction;Splinting;Therapeutic activities;Traction;Aquatic Therapy;Ultrasound;Therapeutic exercise;Cognitive remediation/compensation;Visual/perceptual remediation/compensation;Passive range of motion;Functional Mobility Training;Neuromuscular education;Electrical Stimulation;Energy conservation;Manual Therapy;Patient/family education    Plan review sleep positions,  NMR LUE, aquatics therapy for LUE and balance, simple cooking    OT Home Exercise Plan aquatics for balance and shoulder pain, started with seated self PROM to floor - review at first session    Recommended Other Services seeing PT, ST    Consulted and Agree with Plan of Care Patient             Patient will benefit from skilled therapeutic intervention in order to improve the following deficits and impairments:   Body Structure / Function / Physical Skills: ADL, Decreased knowledge of use of DME, Strength, Dexterity, GMC, Pain, UE functional use, IADL, ROM, Vision, Flexibility, Sensation, FMC, Coordination Cognitive Skills: Attention, Problem Solve, Understand, Emotional     Visit Diagnosis: Frontal lobe and executive function deficit  Other abnormalities of gait and mobility  Lack of coordination  Stiffness of left shoulder, not elsewhere classified  Attention and concentration deficit  Muscle weakness (generalized)  Acute pain of left shoulder    Problem List Patient Active Problem List   Diagnosis Date Noted   Biceps tendonitis on left 02/10/2021   Vascular headache 08/05/2020   Hyponatremia 07/28/2020   Headache 07/24/2020   Neck pain, musculoskeletal 07/24/2020   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    Subarachnoid bleed ([place]) 07/09/2020   Subarachnoid hemorrhage ([place]) 06/29/2020   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[place]) 03/04/2019   S/P hysterectomy 12/13/2017  Abnormal laboratory test 09/09/2016   Gait abnormality 08/03/2016   Weakness 08/03/2016   Paresthesia 08/03/2016   S/P cervical spinal fusion 10/23/2015   Hypersomnia 12/08/2010   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   EPIGASTRIC PAIN 10/13/2009   IRRITABLE BOWEL SYNDROME 03/10/2009   BACK PAIN, LUMBAR 07/01/2008   COSTOCHONDRITIS 02/01/2008   VIRAL URI 01/08/2008   CHEST PAIN 01/08/2008   OTHER SPECIFIED EPISODIC MOOD DISORDER 12/25/2007   ARM PAIN 12/25/2007   ANEMIA-NOS 10/02/2007    Essential hypertension 10/02/2007   GERD 10/02/2007   POLYP, GALLBLADDER 10/02/2007   UTERINE POLYP 10/02/2007   OSTEOARTHRITIS 10/02/2007   Dyskinesia 10/02/2007   HEADACHE 10/02/2007   PALPITATIONS 10/02/2007   CARDIAC MURMUR, HX OF 10/02/2007   NEPHROLITHIASIS, HX OF 10/02/2007    Zachery Conch, OT 04/08/2021, 12:09 PM  [facility] [address] [place], [place], [address] Phone: [phone]   Fax:  [phone]  Name: IYANNI HEPP MRN: [id number] Date of Birth: 18-Jan-1971

## 2021-04-08 NOTE — Therapy (Signed)
[facility] [address] [place], [place], [address] Phone: [phone]   Fax:  [phone]  Speech Language Pathology Treatment  Patient Details  Name: Amy Barry MRN: [id number] Date of Birth: [birth year] Referring Provider (SLP): Alger Simons MD   Encounter Date: 04/08/2021   End of Session - 04/08/21 1015     Visit Number 6    Number of Visits 17    Date for SLP Re-Evaluation 05/03/21    SLP Start Time 0071    SLP Stop Time  1100    SLP Time Calculation (min) 45 min    Activity Tolerance Patient tolerated treatment well             Past Medical History:  Diagnosis Date   Anxiety    Arthralgia    Generalized weakness    bilateral upper and lower extremities,  walks with cane   GERD (gastroesophageal reflux disease)    Headache(784.0)    Heart murmur    per pt "since childhood, no problems"   History of kidney stones    Hypertension    followed pcp   IBS (irritable bowel syndrome)    Iron deficiency anemia    12-06-2017 per pt recently had IV iron infusion July 2019   Low vitamin D level    Osteoarthritis    pt unsure about this   PVC (premature ventricular contraction)    per pt had holter monitor   SLE (systemic lupus erythematosus) ([place])    rheumotologist-- dr syed   Stroke [facility]) 06/2020   Wears glasses     Past Surgical History:  Procedure Laterality Date   CERVICAL DISC ARTHROPLASTY N/A 10/23/2015   Procedure: Cervical Disc Arthroplasty Cervical six-seven;  Surgeon: Eustace Moore, MD;  Location: [facility];  Service: Neurosurgery;  Laterality: N/A;   D & C HYSTEROSCOPY W/ RESECTION ENDOMETRIAL POLYP  10-13-2003    dr rivard @[facility]    ESOPHAGOGASTRODUODENOSCOPY     for GERD   IR ANGIO INTRA EXTRACRAN SEL COM CAROTID INNOMINATE BILAT MOD SED  07/06/2020   IR ANGIO INTRA EXTRACRAN SEL INTERNAL CAROTID BILAT MOD SED  06/29/2020   IR ANGIO VERTEBRAL SEL VERTEBRAL BILAT MOD SED  06/29/2020    IR ANGIO VERTEBRAL SEL VERTEBRAL BILAT MOD SED  07/06/2020   LAPAROSCOPIC ABDOMINAL EXPLORATION  1996   dx ibs   OVARIAN CYST SURGERY  age 60   x2 cyst   RADIOLOGY WITH ANESTHESIA N/A 06/29/2020   Procedure: IR WITH ANESTHESIA;  Surgeon: Radiologist, Medication, MD;  Location: [facility];  Service: Radiology;  Laterality: N/A;   ROBOTIC ASSISTED TOTAL HYSTERECTOMY N/A 12/13/2017   Procedure: XI ROBOTIC ASSISTED TOTAL HYSTERECTOMYWITH BILATERAL SALPINGECTOMY;  Surgeon: Christophe Louis, MD;  Location: [facility];  Service: Gynecology;  Laterality: N/A;   TUBAL LIGATION Bilateral 06-01-2002   dr Mancel Bale @[facility]    PPTL    There were no vitals filed for this visit.   Subjective Assessment - 04/08/21 1015     Subjective "good"    Currently in Pain? Yes    Pain Score 6     Pain Location Shoulder                   ADULT SLP TREATMENT - 04/08/21 1015       General Information   Behavior/Cognition Alert;Cooperative;Pleasant mood;Lethargic      Treatment Provided   Treatment provided Cognitive-Linquistic      Cognitive-Linquistic Treatment   Treatment focused on  Cognition;Patient/family/caregiver education    Skilled Treatment "A little bit better" re: medication management; however, pt missed this morning alarm as pt was showering. Daughter provided verbal prompt to take medication. Changing alarm sounds and using visual aid on pill organizer were reportedly effective. Pt effectively compensated for appointment management as pt took photo of household calendar to look at. SLP suggested closer picture to aid visualization. SLP completed 1/2 remaining CLQT subtests: symbol trails. Pt requested instruction repetition for final 2/3 subtests. Anxiety noted for last subtest which mildly improved with SLP encouragement. Pt did not complete task within time frame but allowed to finish for confidence. Design generation to be completed next session.      Assessment / Recommendations / Plan   Plan Continue with  current plan of care      Progression Toward Goals   Progression toward goals Progressing toward goals              SLP Education - 04/08/21 1250     Education Details appointment management recs    Person(s) Educated Patient    Methods Explanation;Demonstration    Comprehension Verbalized understanding;Returned demonstration;Need further instruction              SLP Short Term Goals - 04/08/21 1024       SLP SHORT TERM GOAL #1   Title Complete CLQT to determine extent of cognitive deficits.    Time 2    Period Weeks    Status On-going      SLP SHORT TERM GOAL #2   Title Pt will manage her calendar and appointments with rare min A from family over 2 weeks    Time 2    Period Weeks    Status On-going      SLP SHORT TERM GOAL #3   Title Pt will sort pills into organizer independently with supervision cues from family    Time 2    Period Weeks    Status On-going      SLP SHORT TERM GOAL #4   Title Pt will pay 3 bills with occasional min A from family    Time 2    Period Weeks    Status On-going      SLP SHORT TERM GOAL #5   Title Pt will carryover 3 compensatory strategies to complete 2 IADL's with occasional min A from family    Time 2    Period Weeks    Status On-going              SLP Long Term Goals - 04/08/21 1024       SLP LONG TERM GOAL #1   Title Pt will independently manage her daily schedule, appointments, to do lists over 1 week    Time 6    Period Weeks    Status On-going      SLP LONG TERM GOAL #2   Title Pt will use compensations for organization and attention to pay 5 bills with rare min A from family    Time 6    Period Weeks    Status On-going      SLP LONG TERM GOAL #3   Title Pt will be independent in medication management with no A from family    Time 6    Period Weeks    Status On-going      SLP LONG TERM GOAL #4   Title Pt will improve score on NeuroQOL Cognitive Function PROM (initial score 39) by 4 points  Time 6    Period Weeks    Status On-going      SLP LONG TERM GOAL #5   Title Pt will carryover compensatory strategies for alternating attention in cooking and IADL's with rare min A from family    Time 6    Period Weeks    Status On-going              Plan - 04/08/21 1023     Clinical Impression Statement Slow processsing and reduced memory persist. No episodes of zoning out. Conducted ongoing education and training of compensatory strategies for medication and appointment managemeent. SLP completed symbol trail subtest of CLQT with additional processing time. Will continue remaining CLQT subtest next session as anxiety permits. Continue skilled ST to maxmize safety, independence, and return to PLOF.    Speech Therapy Frequency 2x / week    Duration 8 weeks    Treatment/Interventions Compensatory strategies;Cueing hierarchy;Functional tasks;Patient/family education;Diet toleration management by SLP;Environmental controls;Cognitive reorganization;Multimodal communcation approach;Compensatory techniques;Internal/external aids;SLP instruction and feedback    Potential to [place]             Patient will benefit from skilled therapeutic intervention in order to improve the following deficits and impairments:   Cognitive communication deficit    Problem List Patient Active Problem List   Diagnosis Date Noted   Biceps tendonitis on left 02/10/2021   Vascular headache 08/05/2020   Hyponatremia 07/28/2020   Headache 07/24/2020   Neck pain, musculoskeletal 07/24/2020   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    Subarachnoid bleed ([place]) 07/09/2020   Subarachnoid hemorrhage ([place]) 06/29/2020   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[place]) 03/04/2019   S/P hysterectomy 12/13/2017   Abnormal laboratory test 09/09/2016   Gait abnormality 08/03/2016   Weakness 08/03/2016   Paresthesia 08/03/2016   S/P cervical spinal fusion 10/23/2015   Hypersomnia 12/08/2010    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   EPIGASTRIC PAIN 10/13/2009   IRRITABLE BOWEL SYNDROME 03/10/2009   BACK PAIN, LUMBAR 07/01/2008   COSTOCHONDRITIS 02/01/2008   VIRAL URI 01/08/2008   CHEST PAIN 01/08/2008   OTHER SPECIFIED EPISODIC MOOD DISORDER 12/25/2007   ARM PAIN 12/25/2007   ANEMIA-NOS 10/02/2007   Essential hypertension 10/02/2007   GERD 10/02/2007   POLYP, GALLBLADDER 10/02/2007   UTERINE POLYP 10/02/2007   OSTEOARTHRITIS 10/02/2007   Dyskinesia 10/02/2007   HEADACHE 10/02/2007   PALPITATIONS 10/02/2007   CARDIAC MURMUR, HX OF 10/02/2007   NEPHROLITHIASIS, HX OF 10/02/2007    Alinda Deem, CCC-SLP 04/08/2021, 12:51 PM  [facility] [address] [place], [place], [address] Phone: [phone]   Fax:  [phone]   Name: KRISTIANNA SAPERSTEIN MRN: [id number] Date of Birth: [birth year]

## 2021-04-08 NOTE — Therapy (Signed)
[facility] [address] [place], [place], [address] Phone: [phone]   Fax:  [phone]  Physical Therapy Treatment  Patient Details  Name: Amy Barry MRN: [id number] Date of Birth: 01/03/1971 Referring Provider (PT): Dr. Naaman Plummer   Encounter Date: 04/08/2021   PT End of Session - 04/08/21 1155     Visit Number 3    Number of Visits 17    Date for PT Re-Evaluation 04/28/21    Authorization Type [employer]    PT Start Time 1150    PT Stop Time 1228    PT Time Calculation (min) 38 min    Behavior During Therapy [facility] for tasks assessed/performed             Past Medical History:  Diagnosis Date   Anxiety    Arthralgia    Generalized weakness    bilateral upper and lower extremities,  walks with cane   GERD (gastroesophageal reflux disease)    Headache(784.0)    Heart murmur    per pt "since childhood, no problems"   History of kidney stones    Hypertension    followed pcp   IBS (irritable bowel syndrome)    Iron deficiency anemia    12-06-2017 per pt recently had IV iron infusion July 2019   Low vitamin D level    Osteoarthritis    pt unsure about this   PVC (premature ventricular contraction)    per pt had holter monitor   SLE (systemic lupus erythematosus) ([place])    rheumotologist-- dr syed   Stroke [facility]) 06/2020   Wears glasses     Past Surgical History:  Procedure Laterality Date   CERVICAL DISC ARTHROPLASTY N/A 10/23/2015   Procedure: Cervical Disc Arthroplasty Cervical six-seven;  Surgeon: Eustace Moore, MD;  Location: [facility];  Service: Neurosurgery;  Laterality: N/A;   D & C HYSTEROSCOPY W/ RESECTION ENDOMETRIAL POLYP  10-13-2003    dr rivard @[facility]    ESOPHAGOGASTRODUODENOSCOPY     for GERD   IR ANGIO INTRA EXTRACRAN SEL COM CAROTID INNOMINATE BILAT MOD SED  07/06/2020   IR ANGIO INTRA EXTRACRAN SEL INTERNAL CAROTID BILAT MOD SED  06/29/2020   IR ANGIO VERTEBRAL SEL VERTEBRAL BILAT MOD SED   06/29/2020   IR ANGIO VERTEBRAL SEL VERTEBRAL BILAT MOD SED  07/06/2020   LAPAROSCOPIC ABDOMINAL EXPLORATION  1996   dx ibs   OVARIAN CYST SURGERY  age 64   x2 cyst   RADIOLOGY WITH ANESTHESIA N/A 06/29/2020   Procedure: IR WITH ANESTHESIA;  Surgeon: Radiologist, Medication, MD;  Location: [facility];  Service: Radiology;  Laterality: N/A;   ROBOTIC ASSISTED TOTAL HYSTERECTOMY N/A 12/13/2017   Procedure: XI ROBOTIC ASSISTED TOTAL HYSTERECTOMYWITH BILATERAL SALPINGECTOMY;  Surgeon: Christophe Louis, MD;  [facility];  Service: Gynecology;  Laterality: N/A;   TUBAL LIGATION Bilateral 06-01-2002   dr Mancel Bale @[facility]    PPTL    Vitals:   04/08/21 1156  BP: 122/72     Subjective Assessment - 04/08/21 1156     Subjective Pt reports she is doing ok.    Patient is accompained by: Family member    Pertinent History medical history of generalized arthritis and myalgias and concern for functional weakness, SLE, irritable bowel, Subarachnoid hemorrhage    Limitations Standing;Walking;Writing;House hold activities;Reading;Lifting    How long can you sit comfortably? no issues    How long can you stand comfortably? 2 min    How long can you walk comfortably?  5 min    Patient Stated Goals Improve independence    Currently in Pain? Yes    Pain Score 6     Pain Location Shoulder    Pain Orientation Left    Pain Descriptors / Indicators Aching    Pain Type Chronic pain    Pain Onset More than a month ago    Pain Frequency Constant    Aggravating Factors  overuse    Pain Relieving Factors pain meds    Pain Onset More than a month ago                               [facility] Adult PT Treatment/Exercise - 04/08/21 1158       Transfers   Transfers Sit to Stand;Stand to Sit    Sit to Stand 5: Supervision    Stand to Sit 5: Supervision      Ambulation/Gait   Ambulation/Gait Yes    Ambulation/Gait Assistance 5: Supervision    Ambulation/Gait Assistance Details Pt was cued to increase step  length and tighten left gluts to stay up tall. Pt initially reported left arm sore but as went on and talked to therapist she did well. Was cued also to try to relax arm more.    Ambulation Distance (Feet) 345 Feet    Assistive device Rolling Pen    Gait Pattern Step-through pattern;Decreased step length - right;Decreased step length - left    Ambulation Surface Level;Indoor      Exercises   Exercises Other Exercises;Knee/Hip      Knee/Hip Exercises: Aerobic   Nustep x 8 min level 3 with BLE and right UE. Pt was cued to try to increase speed some. Pt able to carry on conversation throughout but reported feeling a little SOB.                       PT Short Term Goals - 03/31/21 2018       PT SHORT TERM GOAL #1   Title Patient will report at least 30% reduction in her daily headaches intensity and frequency to improve overall function    Baseline Constant headache that ranges from 4-9/10 daily. 03/31/21 Deferred as not treating for headaches as referral for brain bleed.    Time 6    Period Weeks    Status Deferred    Target Date 04/14/21      PT SHORT TERM GOAL #2   Title Patient will demo improve cervical extension by 5 deg to improve overall cervical ROM    Baseline 03/31/21 Deferred as not treating neck as referral for brain bleed    Time 6    Period Weeks    Status Deferred    Target Date 04/14/21      PT SHORT TERM GOAL #3   Title Patient will be able to ambulate 92' with RW with SBA without needing  aresting break to improve walking endurance in community    Baseline 115' with RW CGA    Time 6    Period Weeks    Status New    Target Date 04/14/21      PT SHORT TERM GOAL #4   Title Patient will be able to perform sit to stand without use of UE to improve functional strength in LE    Baseline Definite need of R UE use with sit to stand transfer    Time 6  Period Weeks    Status New    Target Date 04/14/21               PT Long Term Goals -  03/03/21 1035       PT LONG TERM GOAL #1   Title Patient will demo 0.56m/s improvement in her 10 meter walk gait speed with RW to progress to community ambulator    Baseline 0.41 m/s with RW    Time 8    Period Weeks    Status New    Target Date 04/28/21      PT LONG TERM GOAL #2   Title Patient will report headache frequency of <2x/week to improve daily functioning    Baseline constant headache, headache everyday    Time 8    Period Weeks    Status New    Target Date 04/28/21      PT LONG TERM GOAL #3   Title Pt will demo TUG score in <20 seconds with RW to improve functional mobility    Baseline 40.7 seconds with RW    Time 8    Period Weeks    Status New    Target Date 04/28/21      PT LONG TERM GOAL #4   Title Pt will be able to perform 5x sit to stand with or without UE support in <20 seconds to improve functional strength with transfers    Baseline 34 sec    Time 8    Period Weeks    Status New    Target Date 04/28/21                   Plan - 04/08/21 2108     Clinical Impression Statement PT focused more on trying to get more aerobic activity with use of NuStep and walking during session today. Pt was able to increase walking distance.    Personal Factors and Comorbidities Comorbidity 2;Past/Current Experience;Time since onset of injury/illness/exacerbation    Comorbidities medical history of generalized arthritis and myalgias and concern for functional weakness, SLE, irritable bowel,    Examination-Activity Limitations Bathing;Caring for Others;Carry;Dressing;Hygiene/Grooming;Lift;Locomotion Level;Reach Overhead;Squat;Stairs;Stand;Transfers    Examination-Participation Restrictions Church;Cleaning;Community Activity;Driving;Laundry;Medication Management;Meal Prep;Occupation;Shop;Yard Work;Personal Finances    Stability/Clinical Decision Making Evolving/Moderate complexity    Rehab Potential Good    PT Frequency 2x / week    PT Duration 8 weeks    PT  Treatment/Interventions ADLs/Self Care Home Management;Cryotherapy;Moist Heat;Traction;Gait training;Stair training;Functional mobility training;Therapeutic activities;Therapeutic exercise;Balance training;Manual techniques;Orthotic Fit/Training;Patient/family education;Cognitive remediation;Neuromuscular re-education;Passive range of motion;Energy conservation;Vestibular;Visual/perceptual remediation/compensation;Joint Manipulations;Spinal Manipulations    PT Next Visit Plan Check STGs. Continue more functional activities. Continue NuStep for some aerobic activity and strengthening. HEP with family assist.  work on transfers with proper hand placement, standing balance activities, gait training    Consulted and Agree with Plan of Care Patient;Family member/caregiver    Family Member Consulted daughter             Patient will benefit from skilled therapeutic intervention in order to improve the following deficits and impairments:  Abnormal gait, Decreased activity tolerance, Decreased balance, Decreased cognition, Decreased safety awareness, Decreased range of motion, Decreased mobility, Decreased endurance, Decreased strength, Difficulty walking, Dizziness, Impaired flexibility, Increased fascial restricitons, Hypomobility, Impaired tone, Impaired UE functional use, Improper body mechanics, Postural dysfunction, Pain  Visit Diagnosis: Other abnormalities of gait and mobility  Muscle weakness (generalized)     Problem List Patient Active Problem List   Diagnosis Date Noted   Biceps tendonitis on  left 02/10/2021   Vascular headache 08/05/2020   Hyponatremia 07/28/2020   Headache 07/24/2020   Neck pain, musculoskeletal 07/24/2020   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    Subarachnoid bleed ([place]) 07/09/2020   Subarachnoid hemorrhage ([place]) 06/29/2020   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[place]) 03/04/2019   S/P hysterectomy 12/13/2017   Abnormal laboratory test 09/09/2016   Gait  abnormality 08/03/2016   Weakness 08/03/2016   Paresthesia 08/03/2016   S/P cervical spinal fusion 10/23/2015   Hypersomnia 12/08/2010   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   EPIGASTRIC PAIN 10/13/2009   IRRITABLE BOWEL SYNDROME 03/10/2009   BACK PAIN, LUMBAR 07/01/2008   COSTOCHONDRITIS 02/01/2008   VIRAL URI 01/08/2008   CHEST PAIN 01/08/2008   OTHER SPECIFIED EPISODIC MOOD DISORDER 12/25/2007   ARM PAIN 12/25/2007   ANEMIA-NOS 10/02/2007   Essential hypertension 10/02/2007   GERD 10/02/2007   POLYP, GALLBLADDER 10/02/2007   UTERINE POLYP 10/02/2007   OSTEOARTHRITIS 10/02/2007   Dyskinesia 10/02/2007   HEADACHE 10/02/2007   PALPITATIONS 10/02/2007   CARDIAC MURMUR, HX OF 10/02/2007   NEPHROLITHIASIS, HX OF 10/02/2007    Electa Sniff, PT, DPT, NCS 04/08/2021, 9:12 PM  [facility] [address] [place], [place], [address] Phone: [phone]   Fax:  [phone]  Name: DEVON KINGDON MRN: [id number] Date of Birth: 03-Dec-1970

## 2021-04-12 ENCOUNTER — Ambulatory Visit: Payer: No Typology Code available for payment source | Admitting: Occupational Therapy

## 2021-04-12 ENCOUNTER — Encounter: Payer: Self-pay | Admitting: Occupational Therapy

## 2021-04-12 ENCOUNTER — Other Ambulatory Visit: Payer: Self-pay

## 2021-04-12 DIAGNOSIS — M6281 Muscle weakness (generalized): Secondary | ICD-10-CM

## 2021-04-12 DIAGNOSIS — R279 Unspecified lack of coordination: Secondary | ICD-10-CM

## 2021-04-12 DIAGNOSIS — R41844 Frontal lobe and executive function deficit: Secondary | ICD-10-CM

## 2021-04-12 DIAGNOSIS — M25612 Stiffness of left shoulder, not elsewhere classified: Secondary | ICD-10-CM

## 2021-04-12 DIAGNOSIS — R41840 Attention and concentration deficit: Secondary | ICD-10-CM

## 2021-04-12 DIAGNOSIS — I69054 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: Secondary | ICD-10-CM

## 2021-04-12 DIAGNOSIS — M25512 Pain in left shoulder: Secondary | ICD-10-CM

## 2021-04-12 NOTE — Therapy (Signed)
[facility] [address] [facility] [place], [place], [address] Phone: [phone]   Fax:  [phone]  Occupational Therapy Treatment  Patient Details  Name: Amy Barry MRN: [id number] Date of Birth: 1971-01-29 Referring Provider (OT): Oval Linsey, MD   Encounter Date: 04/12/2021   OT End of Session - 04/12/21 1836     Visit Number 11    Number of Visits 17    Date for OT Re-Evaluation 05/12/21    Authorization Type [employer]    Authorization Time Period 120 visits PT/OT, 120 visits ST    Authorization - Number of Visits 37    OT Start Time 7564    OT Stop Time 1530    OT Time Calculation (min) 75 min    Activity Tolerance Patient tolerated treatment well             Past Medical History:  Diagnosis Date   Anxiety    Arthralgia    Generalized weakness    bilateral upper and lower extremities,  walks with cane   GERD (gastroesophageal reflux disease)    Headache(784.0)    Heart murmur    per pt "since childhood, no problems"   History of kidney stones    Hypertension    followed pcp   IBS (irritable bowel syndrome)    Iron deficiency anemia    12-06-2017 per pt recently had IV iron infusion July 2019   Low vitamin D level    Osteoarthritis    pt unsure about this   PVC (premature ventricular contraction)    per pt had holter monitor   SLE (systemic lupus erythematosus) ([place])    rheumotologist-- dr syed   Stroke [facility]) 06/2020   Wears glasses     Past Surgical History:  Procedure Laterality Date   CERVICAL DISC ARTHROPLASTY N/A 10/23/2015   Procedure: Cervical Disc Arthroplasty Cervical six-seven;  Surgeon: Eustace Moore, MD;  [facility]: [facility];  Service: Neurosurgery;  Laterality: N/A;   D & C HYSTEROSCOPY W/ RESECTION ENDOMETRIAL POLYP  10-13-2003    dr rivard @[facility]    ESOPHAGOGASTRODUODENOSCOPY     for GERD   IR ANGIO INTRA EXTRACRAN SEL COM CAROTID INNOMINATE BILAT MOD SED  07/06/2020   IR ANGIO INTRA  EXTRACRAN SEL INTERNAL CAROTID BILAT MOD SED  06/29/2020   IR ANGIO VERTEBRAL SEL VERTEBRAL BILAT MOD SED  06/29/2020   IR ANGIO VERTEBRAL SEL VERTEBRAL BILAT MOD SED  07/06/2020   LAPAROSCOPIC ABDOMINAL EXPLORATION  1996   dx ibs   OVARIAN CYST SURGERY  age 41   x2 cyst   RADIOLOGY WITH ANESTHESIA N/A 06/29/2020   Procedure: IR WITH ANESTHESIA;  Surgeon: Radiologist, Medication, MD;  Location: [facility];  Service: Radiology;  Laterality: N/A;   ROBOTIC ASSISTED TOTAL HYSTERECTOMY N/A 12/13/2017   Procedure: XI ROBOTIC ASSISTED TOTAL HYSTERECTOMYWITH BILATERAL SALPINGECTOMY;  Surgeon: Christophe Louis, MD;  Location: [facility];  Service: Gynecology;  Laterality: N/A;   TUBAL LIGATION Bilateral 06-01-2002   dr Mancel Bale @[facility]    PPTL    There were no vitals filed for this visit.   Subjective Assessment - 04/12/21 1835     Subjective  My arm hurts today    Patient is accompanied by: Family member    Pertinent History PMH: HTN, IBS, anemia, connective tissue disorder, family history of cerebral aneurysms    Limitations Fall Risk, L shoulder pain, L hemi    Pain Score 6     Pain Location Shoulder  Pain Orientation Left    Pain Descriptors / Indicators Aching    Pain Type Chronic pain    Pain Onset More than a month ago    Pain Frequency Constant    Aggravating Factors  movement    Pain Relieving Factors rest            Patient seen for aquatic therapy visit today.  Patient walked on pool deck with min assist and rolling Bennie.  Patient caught Coor on rubber mat on pool deck and needed increased time to problem solve and adjust Pierrelouis for safety.  Patient entered and exited pool via stairs and min assistance.   Patient continues with stimulus bound behavior - very surface dependent when attempting to move herself from place to place.  Patient with large sways anterior / posterior when attempting to walk in the water, although never fully lost her balance.  Patient having difficulty placing right foot  on pool bottom, then also difficulty weight shifting forward onto left leg.  Patterns of walking somewhat inconsistent - but decreased weight shift left and forward , as well as decreased anterior trunk control was prevalent throughout walking in all conditions.  Worked on slowing speed of steps, reducing UE support, and focusing on forward weight shift, and weight shifting over left foot.  Also worked on patient attempting static standing in dynamic environment.  Patient very challenged by even small amounts of turbulence.  In modified plantigrade worked on shoulder stretches, and beginning to load left arm.  Patient has difficulty with all weight shift left so difficulty to load left arm sufficient to unload right arm.  Worked in quadruped on underwater bench to address child's pose modification for even greater left arm stretch.  Patient without report of increased pain with near end range of shoulder flexion, but reported pain with increased effort to weight shift onto left side to un-weight right arm or leg.                           OT Short Term Goals - 04/12/21 1840       OT SHORT TERM GOAL #1   Title Pt will be independent with initial HEP for LUE    Time 4    Period Weeks    Status Achieved      OT SHORT TERM GOAL #2   Title Pt will perform UB dressing with pain no greater than 7/10 in LUE shoulder and with no physical assistance    Time 4    Status Achieved      OT SHORT TERM GOAL #3   Title Pt will achieve 90 degrees of AROM shoulder flexion with LUE in order to progress to using LUE for functional reaching activities    Baseline 75* AROM - limited by pain    Time 4    Period Weeks    Status Achieved      OT SHORT TERM GOAL #4   Title Pt will increase grip strength in LUE by 5lbs or greater.    Baseline L 19.6, R 39.4,      1/3 - L 28.8!, R 52.6    Time 4    Period Weeks    Status Achieved      OT SHORT TERM GOAL #5   Title Pt will perform simple  warm meal prep and/or light housekeeping with supervision and good safety awareness.    Baseline 1/3 reports helping daughter make a  salad, and she reports doing laundry    Time 4    Period Weeks    Status Achieved               OT Long Term Goals - 04/12/21 1841       OT LONG TERM GOAL #1   Title Pt will be independent with updated HEP for LUE    Time 10    Period Weeks    Status On-going    Target Date 05/12/21      OT LONG TERM GOAL #2   Title Pt will report pain no greater than 6/10 while completing HEP    Time 10    Period Weeks    Status On-going      OT LONG TERM GOAL #3   Title Pt will increase fine motor coordination in LUE by completing 9 hole peg test in 25 seconds or less.    Baseline R 21.53s, L 32.28s    Time 10    Period Weeks    Status On-going      OT LONG TERM GOAL #4   Title Pt will demonstrate ability to obtain a lightweight object at 110 degrees shoulder flexion or greater    Baseline 75    Time 10    Period Weeks    Status On-going      OT LONG TERM GOAL #5   Title Pt will increase grip strength in LUE to 30 lbs or greater for increased functional use.    Time 10    Period Weeks    Status On-going                   Plan - 04/12/21 1837     Clinical Impression Statement Pt has shown inconsistent improvement in report of pain - instead of 8-10/10, she is more currently reporting 6-7/10,  Pain does not respond well to conventional therapy.  Focus of therapy continues to be to improve functional abilities - working to have patient participate more actively inher day to day self care.    OT Occupational Profile and History Detailed Assessment- Review of Records and additional review of physical, cognitive, psychosocial history related to current functional performance    Occupational performance deficits (Please refer to evaluation for details): ADL's;IADL's    Body Structure / Function / Physical Skills ADL;Decreased knowledge of use  of DME;Strength;Dexterity;GMC;Pain;UE functional use;IADL;ROM;Vision;Flexibility;Sensation;FMC;Coordination    Cognitive Skills Attention;Problem Solve;Understand;Emotional    Rehab Potential Good    Clinical Decision Making Several treatment options, min-mod task modification necessary    Comorbidities Affecting Occupational Performance: May have comorbidities impacting occupational performance    Modification or Assistance to Complete Evaluation  Min-Moderate modification of tasks or assist with assess necessary to complete eval    OT Frequency 2x / week    OT Duration Other (comment)   16 visits over 10 weeks for any scheduling conflicts   OT Treatment/Interventions Self-care/ADL training;Moist Heat;Fluidtherapy;DME and/or AE instruction;Splinting;Therapeutic activities;Traction;Aquatic Therapy;Ultrasound;Therapeutic exercise;Cognitive remediation/compensation;Visual/perceptual remediation/compensation;Passive range of motion;Functional Mobility Training;Neuromuscular education;Electrical Stimulation;Energy conservation;Manual Therapy;Patient/family education    Plan review sleep positions, NMR LUE, aquatics therapy for LUE and balance, simple cooking    OT Home Exercise Plan aquatics for balance and shoulder pain, started with seated self PROM to floor - review at first session    Recommended Other Services seeing PT, ST    Consulted and Agree with Plan of Care Patient             Patient  will benefit from skilled therapeutic intervention in order to improve the following deficits and impairments:   Body Structure / Function / Physical Skills: ADL, Decreased knowledge of use of DME, Strength, Dexterity, GMC, Pain, UE functional use, IADL, ROM, Vision, Flexibility, Sensation, FMC, Coordination Cognitive Skills: Attention, Problem Solve, Understand, Emotional     Visit Diagnosis: 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  (HCC)  Acute pain of left shoulder  Attention and concentration deficit  Stiffness of left shoulder, not elsewhere classified  Muscle weakness (generalized)  Lack of coordination  Frontal lobe and executive function deficit    Problem List Patient Active Problem List   Diagnosis Date Noted   Biceps tendonitis on left 02/10/2021   Vascular headache 08/05/2020   Hyponatremia 07/28/2020   Headache 07/24/2020   Neck pain, musculoskeletal 07/24/2020   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    Subarachnoid bleed ([place]) 07/09/2020   Subarachnoid hemorrhage (HCC) 06/29/2020   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[place]) 03/04/2019   S/P hysterectomy 12/13/2017   Abnormal laboratory test 09/09/2016   Gait abnormality 08/03/2016   Weakness 08/03/2016   Paresthesia 08/03/2016   S/P cervical spinal fusion 10/23/2015   Hypersomnia 12/08/2010   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   EPIGASTRIC PAIN 10/13/2009   IRRITABLE BOWEL SYNDROME 03/10/2009   BACK PAIN, LUMBAR 07/01/2008   COSTOCHONDRITIS 02/01/2008   VIRAL URI 01/08/2008   CHEST PAIN 01/08/2008   OTHER SPECIFIED EPISODIC MOOD DISORDER 12/25/2007   ARM PAIN 12/25/2007   ANEMIA-NOS 10/02/2007   Essential hypertension 10/02/2007   GERD 10/02/2007   POLYP, GALLBLADDER 10/02/2007   UTERINE POLYP 10/02/2007   OSTEOARTHRITIS 10/02/2007   Dyskinesia 10/02/2007   HEADACHE 10/02/2007   PALPITATIONS 10/02/2007   CARDIAC MURMUR, HX OF 10/02/2007   NEPHROLITHIASIS, HX OF 10/02/2007    Amy Barry, OT 04/12/2021, 6:42 PM  [facility] [address] [place], [place], [address] Phone: [phone]   Fax:  [phone]  Name: Amy Barry MRN: [id number] Date of Birth: 10-Mar-1971

## 2021-04-13 ENCOUNTER — Ambulatory Visit: Payer: No Typology Code available for payment source

## 2021-04-13 ENCOUNTER — Encounter: Payer: Self-pay | Admitting: Occupational Therapy

## 2021-04-13 ENCOUNTER — Ambulatory Visit: Payer: No Typology Code available for payment source | Admitting: Occupational Therapy

## 2021-04-13 DIAGNOSIS — R41841 Cognitive communication deficit: Secondary | ICD-10-CM

## 2021-04-13 DIAGNOSIS — M25512 Pain in left shoulder: Secondary | ICD-10-CM

## 2021-04-13 DIAGNOSIS — I69054 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: Secondary | ICD-10-CM | POA: Diagnosis not present

## 2021-04-13 DIAGNOSIS — M6281 Muscle weakness (generalized): Secondary | ICD-10-CM

## 2021-04-13 DIAGNOSIS — M25612 Stiffness of left shoulder, not elsewhere classified: Secondary | ICD-10-CM

## 2021-04-13 DIAGNOSIS — R2689 Other abnormalities of gait and mobility: Secondary | ICD-10-CM

## 2021-04-13 DIAGNOSIS — R41844 Frontal lobe and executive function deficit: Secondary | ICD-10-CM

## 2021-04-13 DIAGNOSIS — R279 Unspecified lack of coordination: Secondary | ICD-10-CM

## 2021-04-13 DIAGNOSIS — R41840 Attention and concentration deficit: Secondary | ICD-10-CM

## 2021-04-13 NOTE — Therapy (Signed)
[facility] [address] [place], [place], [address] Phone: [phone]   Fax:  [phone]  Occupational Therapy Treatment  Patient Details  Name: Amy Barry MRN: [id number] Date of Birth: Aug 20, 1970 Referring Provider (OT): Oval Linsey, MD   Encounter Date: 04/13/2021   OT End of Session - 04/13/21 1138     Visit Number 12    Number of Visits 17    Date for OT Re-Evaluation 05/12/21    Authorization Type Aetna    Authorization Time Period 120 visits PT/OT, 120 visits ST    Authorization - Number of Visits 46    OT Start Time 1100    OT Stop Time 1140    OT Time Calculation (min) 40 min    Activity Tolerance Patient tolerated treatment well    Behavior During Therapy WFL for tasks assessed/performed             Past Medical History:  Diagnosis Date   Anxiety    Arthralgia    Generalized weakness    bilateral upper and lower extremities,  walks with cane   GERD (gastroesophageal reflux disease)    Headache(784.0)    Heart murmur    per pt "since childhood, no problems"   History of kidney stones    Hypertension    followed pcp   IBS (irritable bowel syndrome)    Iron deficiency anemia    12-06-2017 per pt recently had IV iron infusion July 2019   Low vitamin D level    Osteoarthritis    pt unsure about this   PVC (premature ventricular contraction)    per pt had holter monitor   SLE (systemic lupus erythematosus) ([place])    rheumotologist-- dr syed   Stroke [facility]) 06/2020   Wears glasses     Past Surgical History:  Procedure Laterality Date   CERVICAL DISC ARTHROPLASTY N/A 10/23/2015   Procedure: Cervical Disc Arthroplasty Cervical six-seven;  Surgeon: Eustace Moore, MD;  [facility]: [facility];  Service: Neurosurgery;  Laterality: N/A;   D & C HYSTEROSCOPY W/ RESECTION ENDOMETRIAL POLYP  10-13-2003    dr rivard _0    ESOPHAGOGASTRODUODENOSCOPY     for GERD   IR ANGIO INTRA EXTRACRAN SEL COM  CAROTID INNOMINATE BILAT MOD SED  07/06/2020   IR ANGIO INTRA EXTRACRAN SEL INTERNAL CAROTID BILAT MOD SED  06/29/2020   IR ANGIO VERTEBRAL SEL VERTEBRAL BILAT MOD SED  06/29/2020   IR ANGIO VERTEBRAL SEL VERTEBRAL BILAT MOD SED  07/06/2020   LAPAROSCOPIC ABDOMINAL EXPLORATION  1996   dx ibs   OVARIAN CYST SURGERY  age 53   x2 cyst   RADIOLOGY WITH ANESTHESIA N/A 06/29/2020   Procedure: IR WITH ANESTHESIA;  Surgeon: Radiologist, Medication, MD;  Location: [facility];  Service: Radiology;  Laterality: N/A;   ROBOTIC ASSISTED TOTAL HYSTERECTOMY N/A 12/13/2017   Procedure: XI ROBOTIC ASSISTED TOTAL HYSTERECTOMYWITH BILATERAL SALPINGECTOMY;  Surgeon: Christophe Louis, MD;  Location: [facility];  Service: Gynecology;  Laterality: N/A;   TUBAL LIGATION Bilateral 06-01-2002   dr Mancel Bale _1    PPTL    There were no vitals filed for this visit.   Subjective Assessment - 04/13/21 1109     Subjective  I slept well - (after pool session yesterday)    Pertinent History PMH: HTN, IBS, anemia, connective tissue disorder, family history of cerebral aneurysms    Limitations Fall Risk, L shoulder pain, L hemi    Currently in Pain? Yes  Pain Score 6     Pain Location Arm    Pain Orientation Left    Pain Descriptors / Indicators Aching    Pain Type Chronic pain    Pain Onset More than a month ago    Pain Frequency Constant    Aggravating Factors  movement    Pain Relieving Factors rest                [facility] OT Assessment - 04/13/21 0001       Hand Function   Right Hand Gross Grasp Functional    Right Hand Grip (lbs) 57.3    Left Hand Grip (lbs) 30.2                      OT Treatments/Exercises (OP) - 04/13/21 1146       ADLs   ADL Comments Patient has met two long term goals.  Reviewed remianing goals and plan of care.      Neurological Re-education Exercises   Other Exercises 1 Neuromuscular reeducation to address exercises patient could potentially do at home.  Patient asked to  demonstrate table slides.  Patient immediately reported 8+/10 pain with activity.  Encouraged patient to go slower, and start in smaller range.  Patient has pain, then stops moving, this leads to tightness, and cycle continues.  Reinforced the importance of moving, but moving and gradually building range.  Worked on stabilizing arms on elevated surface on table (~ 90 degrees of shoulder flexion) then weight shifting though body to increase shoulder range.                    OT Education - 04/13/21 1145     Education Details Seated stretching exercises at table, table slides    Person(s) Educated Patient    Methods Explanation;Demonstration;Verbal cues    Comprehension Returned demonstration;Verbalized understanding;Need further instruction              OT Short Term Goals - 04/12/21 1840       OT SHORT TERM GOAL #1   Title Pt will be independent with initial HEP for LUE    Time 4    Period Weeks    Status Achieved      OT SHORT TERM GOAL #2   Title Pt will perform UB dressing with pain no greater than 7/10 in LUE shoulder and with no physical assistance    Time 4    Status Achieved      OT SHORT TERM GOAL #3   Title Pt will achieve 90 degrees of AROM shoulder flexion with LUE in order to progress to using LUE for functional reaching activities    Baseline 75* AROM - limited by pain    Time 4    Period Weeks    Status Achieved      OT SHORT TERM GOAL #4   Title Pt will increase grip strength in LUE by 5lbs or greater.    Baseline L 19.6, R 39.4,      1/3 - L 28.8!, R 52.6    Time 4    Period Weeks    Status Achieved      OT SHORT TERM GOAL #5   Title Pt will perform simple warm meal prep and/or light housekeeping with supervision and good safety awareness.    Baseline 1/3 reports helping daughter make a salad, and she reports doing laundry    Time 4    Period Weeks  Status Achieved               OT Long Term Goals - 04/13/21 1200       OT LONG  TERM GOAL #1   Title Pt will be independent with updated HEP for LUE    Time 10    Period Weeks    Status On-going    Target Date 05/12/21      OT LONG TERM GOAL #2   Title Pt will report pain no greater than 6/10 while completing HEP    Time 10    Period Weeks    Status On-going      OT LONG TERM GOAL #3   Title Pt will increase fine motor coordination in LUE by completing 9 hole peg test in 25 seconds or less.    Baseline R 21.53s, L 32.28s    Time 10    Period Weeks    Status Achieved      OT LONG TERM GOAL #4   Title Pt will demonstrate ability to obtain a lightweight object at 110 degrees shoulder flexion or greater    Baseline 75    Time 10    Period Weeks    Status On-going      OT LONG TERM GOAL #5   Title Pt will increase grip strength in LUE to 30 lbs or greater for increased functional use.    Time 10    Period Weeks    Status Achieved                   Plan - 04/13/21 1145     Clinical Impression Statement Pt has met two long term goals - needs further work on functional use and range of motion in LUE.  Anticipate discharge as planned at end of this plan of care.    OT Occupational Profile and History Detailed Assessment- Review of Records and additional review of physical, cognitive, psychosocial history related to current functional performance    Occupational performance deficits (Please refer to evaluation for details): ADL's;IADL's    Body Structure / Function / Physical Skills ADL;Decreased knowledge of use of DME;Strength;Dexterity;GMC;Pain;UE functional use;IADL;ROM;Vision;Flexibility;Sensation;FMC;Coordination    Cognitive Skills Attention;Problem Solve;Understand;Emotional    Rehab Potential Good    Clinical Decision Making Several treatment options, min-mod task modification necessary    Comorbidities Affecting Occupational Performance: May have comorbidities impacting occupational performance    Modification or Assistance to Complete  Evaluation  Min-Moderate modification of tasks or assist with assess necessary to complete eval    OT Frequency 2x / week    OT Duration Other (comment)   16 visits over 10 weeks for any scheduling conflicts   OT Treatment/Interventions Self-care/ADL training;Moist Heat;Fluidtherapy;DME and/or AE instruction;Splinting;Therapeutic activities;Traction;Aquatic Therapy;Ultrasound;Therapeutic exercise;Cognitive remediation/compensation;Visual/perceptual remediation/compensation;Passive range of motion;Functional Mobility Training;Neuromuscular education;Electrical Stimulation;Energy conservation;Manual Therapy;Patient/family education    Plan NMR LUE - increase shoulder range of motion and functional use, aquatics therapy for LUE and balance, simple cooking    OT Home Exercise Plan aquatics for balance and shoulder pain, started with seated self PROM to floor - review at first session    Recommended Other Services seeing PT, ST    Consulted and Agree with Plan of Care Patient             Patient will benefit from skilled therapeutic intervention in order to improve the following deficits and impairments:   Body Structure / Function / Physical Skills: ADL, Decreased knowledge of use of DME, Strength, Dexterity,  GMC, Pain, UE functional use, IADL, ROM, Vision, Flexibility, Sensation, FMC, Coordination Cognitive Skills: Attention, Problem Solve, Understand, Emotional     Visit Diagnosis: 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 (HCC)  Acute pain of left shoulder  Attention and concentration deficit  Stiffness of left shoulder, not elsewhere classified  Muscle weakness (generalized)  Lack of coordination  Frontal lobe and executive function deficit    Problem List Patient Active Problem List   Diagnosis Date Noted   Biceps tendonitis on left 02/10/2021   Vascular headache 08/05/2020   Hyponatremia 07/28/2020    Headache 07/24/2020   Neck pain, musculoskeletal 07/24/2020   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    Subarachnoid bleed ([place]) 07/09/2020   Subarachnoid hemorrhage (HCC) 06/29/2020   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[place]) 03/04/2019   S/P hysterectomy 12/13/2017   Abnormal laboratory test 09/09/2016   Gait abnormality 08/03/2016   Weakness 08/03/2016   Paresthesia 08/03/2016   S/P cervical spinal fusion 10/23/2015   Hypersomnia 12/08/2010   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   EPIGASTRIC PAIN 10/13/2009   IRRITABLE BOWEL SYNDROME 03/10/2009   BACK PAIN, LUMBAR 07/01/2008   COSTOCHONDRITIS 02/01/2008   VIRAL URI 01/08/2008   CHEST PAIN 01/08/2008   OTHER SPECIFIED EPISODIC MOOD DISORDER 12/25/2007   ARM PAIN 12/25/2007   ANEMIA-NOS 10/02/2007   Essential hypertension 10/02/2007   GERD 10/02/2007   POLYP, GALLBLADDER 10/02/2007   UTERINE POLYP 10/02/2007   OSTEOARTHRITIS 10/02/2007   Dyskinesia 10/02/2007   HEADACHE 10/02/2007   PALPITATIONS 10/02/2007   CARDIAC MURMUR, HX OF 10/02/2007   NEPHROLITHIASIS, HX OF 10/02/2007    Mariah Milling, OT 04/13/2021, 12:01 PM  [facility] [address] [place], [place], [address] Phone: [phone]   Fax:  [phone]  Name: Amy Barry MRN: [id number] Date of Birth: 09-18-1970

## 2021-04-13 NOTE — Therapy (Addendum)
[facility] [address] [place], [place], [address] Phone: [phone]   Fax:  [phone]  Speech Language Pathology Treatment  Patient Details  Name: Amy Barry MRN: [id number] Date of Birth: September 26, 1970 Referring Provider (SLP): Alger Simons MD   Encounter Date: 04/13/2021   End of Session - 04/13/21 0931     Visit Number 7    Number of Visits 17    Date for SLP Re-Evaluation 05/03/21    SLP Start Time 0932    SLP Stop Time  5449    SLP Time Calculation (min) 43 min    Activity Tolerance Patient tolerated treatment well;Other (comment)   anxiety            Past Medical History:  Diagnosis Date   Anxiety    Arthralgia    Generalized weakness    bilateral upper and lower extremities,  walks with cane   GERD (gastroesophageal reflux disease)    Headache(784.0)    Heart murmur    per pt "since childhood, no problems"   History of kidney stones    Hypertension    followed pcp   IBS (irritable bowel syndrome)    Iron deficiency anemia    12-06-2017 per pt recently had IV iron infusion July 2019   Low vitamin D level    Osteoarthritis    pt unsure about this   PVC (premature ventricular contraction)    per pt had holter monitor   SLE (systemic lupus erythematosus) ([place])    rheumotologist-- dr syed   Stroke [facility]) 06/2020   Wears glasses     Past Surgical History:  Procedure Laterality Date   CERVICAL DISC ARTHROPLASTY N/A 10/23/2015   Procedure: Cervical Disc Arthroplasty Cervical six-seven;  Surgeon: Eustace Moore, MD;  Location: [place] [facility];  Service: Neurosurgery;  Laterality: N/A;   D & C HYSTEROSCOPY W/ RESECTION ENDOMETRIAL POLYP  10-13-2003    dr rivard @[facility]    ESOPHAGOGASTRODUODENOSCOPY     for GERD   IR ANGIO INTRA EXTRACRAN SEL COM CAROTID INNOMINATE BILAT MOD SED  07/06/2020   IR ANGIO INTRA EXTRACRAN SEL INTERNAL CAROTID BILAT MOD SED  06/29/2020   IR ANGIO VERTEBRAL SEL VERTEBRAL  BILAT MOD SED  06/29/2020   IR ANGIO VERTEBRAL SEL VERTEBRAL BILAT MOD SED  07/06/2020   LAPAROSCOPIC ABDOMINAL EXPLORATION  1996   dx ibs   OVARIAN CYST SURGERY  age 2   x2 cyst   RADIOLOGY WITH ANESTHESIA N/A 06/29/2020   Procedure: IR WITH ANESTHESIA;  Surgeon: Radiologist, Medication, MD;  Location: [facility];  Service: Radiology;  Laterality: N/A;   ROBOTIC ASSISTED TOTAL HYSTERECTOMY N/A 12/13/2017   Procedure: XI ROBOTIC ASSISTED TOTAL HYSTERECTOMYWITH BILATERAL SALPINGECTOMY;  Surgeon: Christophe Louis, MD;  Location: [facility];  Service: Gynecology;  Laterality: N/A;   TUBAL LIGATION Bilateral 06-01-2002   dr Mancel Bale @[facility]    PPTL    There were no vitals filed for this visit.   Subjective Assessment - 04/13/21 0931     Subjective "just trying to get ready"    Patient is accompained by: Family member   daughter   Currently in Pain? Yes    Pain Score 6     Pain Location Shoulder                   ADULT SLP TREATMENT - 04/13/21 0931       General Information   Behavior/Cognition Alert;Cooperative;Pleasant mood;Lethargic      Treatment  Provided   Treatment provided Cognitive-Linquistic      Cognitive-Linquistic Treatment   Treatment focused on Cognition;Patient/family/caregiver education    Skilled Treatment Pain and fatigue endorsed today. Pt attended family event this weekend, in which pt needed to sit in corner with noise cancelling headphones on to combat over-stimulation. Over-stimulation was present prior to stroke, with daughter reporting strong sensitivity to sound and smells. SLP completed remaining subtest of CLQT (design generation), in which pt designed two designs in 3 minutes. CLQT results are as folllows: Severe memory, moderate executive functions and language, and mild attention and visuospatial skills. Related to functional tasks, pt endorsed reading sentences over and over and difficulty writing. SLP targeted reading short passage. Despite accurate oral reading, pt  noted to reread same words/sentence again. In total, pt read 19 words prior to becoming overwhelmed. SLP implemented use of calming strategies, in which pt required remainder of session to compose herself. Daughter reports this occurs when she becomes upset.      Assessment / Recommendations / Plan   Plan Continue with current plan of care      Progression Toward Goals   Progression toward goals Progressing toward goals              SLP Education - 04/13/21 1024     Education Details reading HEP, visual trackers, audio recordings    Person(s) Educated Patient    Methods Explanation;Demonstration;Handout    Comprehension Verbalized understanding;Returned demonstration;Need further instruction              SLP Short Term Goals - 04/13/21 0955       SLP SHORT TERM GOAL #1   Title Complete CLQT to determine extent of cognitive deficits.    Status Achieved      SLP SHORT TERM GOAL #2   Title Pt will manage her calendar and appointments with rare min A from family over 2 weeks    Time 1    Period Weeks    Status On-going      SLP SHORT TERM GOAL #3   Title Pt will sort pills into organizer independently with supervision cues from family    Time 1    Period Weeks    Status On-going      SLP SHORT TERM GOAL #4   Title Pt will pay 3 bills with occasional min A from family    Time 1    Period Weeks    Status On-going      SLP SHORT TERM GOAL #5   Title Pt will carryover 3 compensatory strategies to complete 2 IADL's with occasional min A from family    Time 1    Period Weeks    Status On-going              SLP Long Term Goals - 04/13/21 1779       SLP LONG TERM GOAL #1   Title Pt will independently manage her daily schedule, appointments, to do lists over 1 week    Time 5    Period Weeks    Status On-going      SLP LONG TERM GOAL #2   Title Pt will use compensations for organization and attention to pay 5 bills with rare min A from family    Time 5     Period Weeks    Status On-going      SLP LONG TERM GOAL #3   Title Pt will be independent in medication management with no A from  family    Time 5    Period Weeks    Status On-going      SLP LONG TERM GOAL #4   Title Pt will improve score on NeuroQOL Cognitive Function PROM (initial score 39) by 4 points    Time 5    Period Weeks    Status On-going      SLP LONG TERM GOAL #5   Title Pt will carryover compensatory strategies for alternating attention in cooking and IADL's with rare min A from family    Time 5    Period Weeks    Status On-going              Plan - 04/13/21 0931     Clinical Impression Statement Slow processsing and reduced memory persist. CLQT completed which revealed severe memory deficits, moderate executive functioning and language, and mild attention and visospatial skills. Limited therapeutic activities completed today due to heightened anxiety and emotional lability during oral reading task. Continue skilled ST to maxmize safety, independence, and return to PLOF.    Speech Therapy Frequency 2x / week    Duration 8 weeks    Treatment/Interventions Compensatory strategies;Cueing hierarchy;Functional tasks;Patient/family education;Diet toleration management by SLP;Environmental controls;Cognitive reorganization;Multimodal communcation approach;Compensatory techniques;Internal/external aids;SLP instruction and feedback    Potential to [place]             Patient will benefit from skilled therapeutic intervention in order to improve the following deficits and impairments:   Cognitive communication deficit    Problem List Patient Active Problem List   Diagnosis Date Noted   Biceps tendonitis on left 02/10/2021   Vascular headache 08/05/2020   Hyponatremia 07/28/2020   Headache 07/24/2020   Neck pain, musculoskeletal 07/24/2020   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    Subarachnoid bleed ([place]) 07/09/2020    Subarachnoid hemorrhage ([place]) 06/29/2020   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[place]) 03/04/2019   S/P hysterectomy 12/13/2017   Abnormal laboratory test 09/09/2016   Gait abnormality 08/03/2016   Weakness 08/03/2016   Paresthesia 08/03/2016   S/P cervical spinal fusion 10/23/2015   Hypersomnia 12/08/2010   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   EPIGASTRIC PAIN 10/13/2009   IRRITABLE BOWEL SYNDROME 03/10/2009   BACK PAIN, LUMBAR 07/01/2008   COSTOCHONDRITIS 02/01/2008   VIRAL URI 01/08/2008   CHEST PAIN 01/08/2008   OTHER SPECIFIED EPISODIC MOOD DISORDER 12/25/2007   ARM PAIN 12/25/2007   ANEMIA-NOS 10/02/2007   Essential hypertension 10/02/2007   GERD 10/02/2007   POLYP, GALLBLADDER 10/02/2007   UTERINE POLYP 10/02/2007   OSTEOARTHRITIS 10/02/2007   Dyskinesia 10/02/2007   HEADACHE 10/02/2007   PALPITATIONS 10/02/2007   CARDIAC MURMUR, HX OF 10/02/2007   NEPHROLITHIASIS, HX OF 10/02/2007    Alinda Deem, CCC-SLP 04/13/2021, 10:31 AM  [facility] [address] [place], [place], [address] Phone: [phone]   Fax:  [phone]   Name: Amy Barry MRN: [id number] Date of Birth: 1970/11/30

## 2021-04-13 NOTE — Therapy (Signed)
[facility] [address] [place], [place], [address] Phone: [phone]   Fax:  [phone]  Physical Therapy Treatment  Patient Details  Name: Amy Barry MRN: [id number] Date of Birth: 25-Dec-1970 Referring Provider (PT): Dr. Naaman Plummer   Encounter Date: 04/13/2021   PT End of Session - 04/13/21 1019     Visit Number 4    Number of Visits 17    Date for PT Re-Evaluation 04/28/21    Authorization Type [employer]    PT Start Time 1018    PT Stop Time 1100    PT Time Calculation (min) 42 min    Equipment Utilized During Treatment Gait belt    Activity Tolerance Patient tolerated treatment well    Behavior During Therapy WFL for tasks assessed/performed             Past Medical History:  Diagnosis Date   Anxiety    Arthralgia    Generalized weakness    bilateral upper and lower extremities,  walks with cane   GERD (gastroesophageal reflux disease)    Headache(784.0)    Heart murmur    per pt "since childhood, no problems"   History of kidney stones    Hypertension    followed pcp   IBS (irritable bowel syndrome)    Iron deficiency anemia    12-06-2017 per pt recently had IV iron infusion July 2019   Low vitamin D level    Osteoarthritis    pt unsure about this   PVC (premature ventricular contraction)    per pt had holter monitor   SLE (systemic lupus erythematosus) ([place])    rheumotologist-- dr syed   Stroke [facility]) 06/2020   Wears glasses     Past Surgical History:  Procedure Laterality Date   CERVICAL DISC ARTHROPLASTY N/A 10/23/2015   Procedure: Cervical Disc Arthroplasty Cervical six-seven;  Surgeon: Eustace Moore, MD;  Location: [facility]UR[facility];  Service: Neurosurgery;  Laterality: N/A;   D & C HYSTEROSCOPY W/ RESECTION ENDOMETRIAL POLYP  10-13-2003    dr rivard _0    ESOPHAGOGASTRODUODENOSCOPY     for GERD   IR ANGIO INTRA EXTRACRAN SEL COM CAROTID INNOMINATE BILAT MOD SED  07/06/2020   IR ANGIO INTRA  EXTRACRAN SEL INTERNAL CAROTID BILAT MOD SED  06/29/2020   IR ANGIO VERTEBRAL SEL VERTEBRAL BILAT MOD SED  06/29/2020   IR ANGIO VERTEBRAL SEL VERTEBRAL BILAT MOD SED  07/06/2020   LAPAROSCOPIC ABDOMINAL EXPLORATION  1996   dx ibs   OVARIAN CYST SURGERY  age 51   x2 cyst   RADIOLOGY WITH ANESTHESIA N/A 06/29/2020   Procedure: IR WITH ANESTHESIA;  Surgeon: Radiologist, Medication, MD;  Location: [facility];  Service: Radiology;  Laterality: N/A;   ROBOTIC ASSISTED TOTAL HYSTERECTOMY N/A 12/13/2017   Procedure: XI ROBOTIC ASSISTED TOTAL HYSTERECTOMYWITH BILATERAL SALPINGECTOMY;  Surgeon: Christophe Louis, MD;  Location: [facility];  Service: Gynecology;  Laterality: N/A;   TUBAL LIGATION Bilateral 06-01-2002   dr Mancel Bale _1    PPTL    There were no vitals filed for this visit.   Subjective Assessment - 04/13/21 1020     Subjective Pt reports that she did a lot of walking in pool yesterday and it went alright.    Patient is accompained by: Family member    Pertinent History medical history of generalized arthritis and myalgias and concern for functional weakness, SLE, irritable bowel, Subarachnoid hemorrhage    Limitations Standing;Walking;Writing;House hold activities;Reading;Lifting    How long  can you sit comfortably? no issues    How long can you stand comfortably? 2 min    How long can you walk comfortably? 5 min    Patient Stated Goals Improve independence    Currently in Pain? Yes    Pain Score 6     Pain Location Arm    Pain Orientation Left    Pain Descriptors / Indicators Aching    Pain Type Chronic pain    Pain Onset More than a month ago    Pain Frequency Constant    Pain Onset More than a month ago                               [facility] Adult PT Treatment/Exercise - 04/13/21 1021       Transfers   Transfers Sit to Stand;Stand to Sit    Sit to Stand 5: Supervision;4: Min guard    Sit to Stand Details Verbal cues for technique    Stand to Sit 5: Supervision;4: Min  guard    Stand to Sit Details (indicate cue type and reason) Verbal cues for technique    Stand to Sit Details Cued to bend at hips prior to sitting. Assistance level varied throughout session depending on if focusing on the activity or just getting up to do something else.    Comments Performed sit to stand with 1 hand on [place] and 1 on mat, then 2 hands on mat, then 1 hand on mat, then no hands x 2 CGA. With descent was cued to bend at hips and go down slowly. Pt started to have more extraneous movements with focusing on the task.  Mini-squat holding to [place] x 5 CGA with pt having increased shaking to control. Reported thighs sore with activity. After sit to stands p suddenly having difficulty advancing feet with [place] and encouraged to keep stepping with ataxic presentation of foot placement min assist.      Ambulation/Gait   Ambulation/Gait Yes    Ambulation/Gait Assistance 5: Supervision    Ambulation/Gait Assistance Details Pt was cued to try to increase step length and relax shoulders. Pt with slow cadence but consistent throughout.    Ambulation Distance (Feet) 460 Feet    Assistive device Rolling Marcinek    Gait Pattern Step-through pattern;Decreased step length - right;Decreased step length - left    Ambulation Surface Level;Indoor      Knee/Hip Exercises: Aerobic   Nustep x 7 min level 3 with BLU and right UE. Verbal cues to keep step/min>20. Pt able to keep it moving better today.                     PT Education - 04/13/21 1330     Education Details Results of goal check    Person(s) Educated Patient    Methods Explanation    Comprehension Verbalized understanding              PT Short Term Goals - 04/13/21 1035       PT SHORT TERM GOAL #1   Title Patient will report at least 30% reduction in her daily headaches intensity and frequency to improve overall function    Baseline Constant headache that ranges from 4-9/10 daily. 03/31/21 Deferred as not  treating for headaches as referral for brain bleed.    Time 6    Period Weeks    Status Deferred    Target Date  04/14/21      PT SHORT TERM GOAL #2   Title Patient will demo improve cervical extension by 5 deg to improve overall cervical ROM    Baseline 03/31/21 Deferred as not treating neck as referral for brain bleed    Time 6    Period Weeks    Status Deferred    Target Date 04/14/21      PT SHORT TERM GOAL #3   Title Patient will be able to ambulate 460' with RW with SBA without needing  aresting break to improve walking endurance in community    Baseline 115' with RW CGA. 04/13/21 460' supervision with RW    Time 6    Period Weeks    Status Achieved    Target Date 04/14/21      PT SHORT TERM GOAL #4   Title Patient will be able to perform sit to stand without use of UE to improve functional strength in LE    Baseline Definite need of R UE use with sit to stand transfer. 04/13/21 Able to perform without UE support from edge of mat but needs close SBA/CGA for balance.    Time 6    Period Weeks    Status Partially Met    Target Date 04/14/21               PT Long Term Goals - 03/03/21 1035       PT LONG TERM GOAL #1   Title Patient will demo 0.77ms improvement in her 10 meter walk gait speed with RW to progress to community ambulator    Baseline 0.41 m/s with RW    Time 8    Period Weeks    Status New    Target Date 04/28/21      PT LONG TERM GOAL #2   Title Patient will report headache frequency of <2x/week to improve daily functioning    Baseline constant headache, headache everyday    Time 8    Period Weeks    Status New    Target Date 04/28/21      PT LONG TERM GOAL #3   Title Pt will demo TUG score in <20 seconds with RW to improve functional mobility    Baseline 40.7 seconds with RW    Time 8    Period Weeks    Status New    Target Date 04/28/21      PT LONG TERM GOAL #4   Title Pt will be able to perform 5x sit to stand with or without UE  support in <20 seconds to improve functional strength with transfers    Baseline 34 sec    Time 8    Period Weeks    Status New    Target Date 04/28/21                   Plan - 04/13/21 1331     Clinical Impression Statement PT assessed STGs today. Headache and cervical ROM goals have been deferred as not what focus of therapy is on from referral. Pt met distance goals for walking with supervision. She had consistent short steps with slow pace. Her presentation does continue to vary throughout session, however. After working on sit to stands she was suddenly having more difficulty advancing legs with extraneous movements. Pt is able to rise without UE support but again ability varies and needs close supervision for safety.    Personal Factors and Comorbidities Comorbidity 2;Past/Current Experience;Time since onset of  injury/illness/exacerbation    Comorbidities medical history of generalized arthritis and myalgias and concern for functional weakness, SLE, irritable bowel,    Examination-Activity Limitations Bathing;Caring for Others;Carry;Dressing;Hygiene/Grooming;Lift;Locomotion Level;Reach Overhead;Squat;Stairs;Stand;Transfers    Examination-Participation Restrictions Church;Cleaning;Community Activity;Driving;Laundry;Medication Management;Meal Prep;Occupation;Shop;Yard Work;Personal Finances    Stability/Clinical Decision Making Evolving/Moderate complexity    Rehab Potential Good    PT Frequency 2x / week    PT Duration 8 weeks    PT Treatment/Interventions ADLs/Self Care Home Management;Cryotherapy;Moist Heat;Traction;Gait training;Stair training;Functional mobility training;Therapeutic activities;Therapeutic exercise;Balance training;Manual techniques;Orthotic Fit/Training;Patient/family education;Cognitive remediation;Neuromuscular re-education;Passive range of motion;Energy conservation;Vestibular;Visual/perceptual remediation/compensation;Joint Manipulations;Spinal Manipulations     PT Next Visit Plan Continue more functional activities. Continue NuStep for some aerobic activity and strengthening. HEP with family assist.  work on transfers with proper hand placement, standing balance activities, gait training    Consulted and Agree with Plan of Care Patient;Family member/caregiver    Family Member Consulted daughter             Patient will benefit from skilled therapeutic intervention in order to improve the following deficits and impairments:  Abnormal gait, Decreased activity tolerance, Decreased balance, Decreased cognition, Decreased safety awareness, Decreased range of motion, Decreased mobility, Decreased endurance, Decreased strength, Difficulty walking, Dizziness, Impaired flexibility, Increased fascial restricitons, Hypomobility, Impaired tone, Impaired UE functional use, Improper body mechanics, Postural dysfunction, Pain  Visit Diagnosis: Other abnormalities of gait and mobility  Muscle weakness (generalized)     Problem List Patient Active Problem List   Diagnosis Date Noted   Biceps tendonitis on left 02/10/2021   Vascular headache 08/05/2020   Hyponatremia 07/28/2020   Headache 07/24/2020   Neck pain, musculoskeletal 07/24/2020   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    Subarachnoid bleed ([place]) 07/09/2020   Subarachnoid hemorrhage ([place]) 06/29/2020   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[place]) 03/04/2019   S/P hysterectomy 12/13/2017   Abnormal laboratory test 09/09/2016   Gait abnormality 08/03/2016   Weakness 08/03/2016   Paresthesia 08/03/2016   S/P cervical spinal fusion 10/23/2015   Hypersomnia 12/08/2010   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   EPIGASTRIC PAIN 10/13/2009   IRRITABLE BOWEL SYNDROME 03/10/2009   BACK PAIN, LUMBAR 07/01/2008   COSTOCHONDRITIS 02/01/2008   VIRAL URI 01/08/2008   CHEST PAIN 01/08/2008   OTHER SPECIFIED EPISODIC MOOD DISORDER 12/25/2007   ARM PAIN 12/25/2007   ANEMIA-NOS 10/02/2007   Essential  hypertension 10/02/2007   GERD 10/02/2007   POLYP, GALLBLADDER 10/02/2007   UTERINE POLYP 10/02/2007   OSTEOARTHRITIS 10/02/2007   Dyskinesia 10/02/2007   HEADACHE 10/02/2007   PALPITATIONS 10/02/2007   CARDIAC MURMUR, HX OF 10/02/2007   NEPHROLITHIASIS, HX OF 10/02/2007    Electa Sniff, PT, DPT, NCS 04/13/2021, 1:38 PM  [facility] [address] [place], [place], [address] Phone: [phone]   Fax:  [phone]  Name: SALLEY BOXLEY MRN: [id number] Date of Birth: 1970/06/18

## 2021-04-15 ENCOUNTER — Ambulatory Visit: Payer: No Typology Code available for payment source

## 2021-04-15 ENCOUNTER — Ambulatory Visit: Payer: No Typology Code available for payment source | Admitting: Occupational Therapy

## 2021-04-15 ENCOUNTER — Other Ambulatory Visit: Payer: Self-pay

## 2021-04-15 DIAGNOSIS — R2689 Other abnormalities of gait and mobility: Secondary | ICD-10-CM

## 2021-04-15 DIAGNOSIS — M6281 Muscle weakness (generalized): Secondary | ICD-10-CM

## 2021-04-15 DIAGNOSIS — I69054 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: Secondary | ICD-10-CM | POA: Diagnosis not present

## 2021-04-15 DIAGNOSIS — R41841 Cognitive communication deficit: Secondary | ICD-10-CM

## 2021-04-15 NOTE — Therapy (Signed)
[facility] [address] [place], [place], [address] Phone: [phone]   Fax:  [phone]  Physical Therapy Treatment  Patient Details  Name: Amy Barry MRN: [id number] Date of Birth: [birth year] Referring Provider (PT): Dr. Naaman Plummer   Encounter Date: 04/15/2021   PT End of Session - 04/15/21 1404     Visit Number 5    Number of Visits 17    Date for PT Re-Evaluation 04/28/21    Authorization Type [employer]    PT Start Time 6606    PT Stop Time 1445    PT Time Calculation (min) 43 min    Equipment Utilized During Treatment Gait belt    Activity Tolerance Patient tolerated treatment well    Behavior During Therapy WFL for tasks assessed/performed             Past Medical History:  Diagnosis Date   Anxiety    Arthralgia    Generalized weakness    bilateral upper and lower extremities,  walks with cane   GERD (gastroesophageal reflux disease)    Headache(784.0)    Heart murmur    per pt "since childhood, no problems"   History of kidney stones    Hypertension    followed pcp   IBS (irritable bowel syndrome)    Iron deficiency anemia    12-06-2017 per pt recently had IV iron infusion July 2019   Low vitamin D level    Osteoarthritis    pt unsure about this   PVC (premature ventricular contraction)    per pt had holter monitor   SLE (systemic lupus erythematosus) ([place])    rheumotologist-- dr syed   Stroke [facility]) 06/2020   Wears glasses     Past Surgical History:  Procedure Laterality Date   CERVICAL DISC ARTHROPLASTY N/A 10/23/2015   Procedure: Cervical Disc Arthroplasty Cervical six-seven;  Surgeon: Eustace Moore, MD;  Location: [facility];  Service: Neurosurgery;  Laterality: N/A;   D & C HYSTEROSCOPY W/ RESECTION ENDOMETRIAL POLYP  10-13-2003    dr rivard _0    ESOPHAGOGASTRODUODENOSCOPY     for GERD   IR ANGIO INTRA EXTRACRAN SEL COM CAROTID INNOMINATE BILAT MOD SED  07/06/2020   IR ANGIO INTRA  EXTRACRAN SEL INTERNAL CAROTID BILAT MOD SED  06/29/2020   IR ANGIO VERTEBRAL SEL VERTEBRAL BILAT MOD SED  06/29/2020   IR ANGIO VERTEBRAL SEL VERTEBRAL BILAT MOD SED  07/06/2020   LAPAROSCOPIC ABDOMINAL EXPLORATION  1996   dx ibs   OVARIAN CYST SURGERY  age 51   x2 cyst   RADIOLOGY WITH ANESTHESIA N/A 06/29/2020   Procedure: IR WITH ANESTHESIA;  Surgeon: Radiologist, Medication, MD;  Location: [facility];  Service: Radiology;  Laterality: N/A;   ROBOTIC ASSISTED TOTAL HYSTERECTOMY N/A 12/13/2017   Procedure: XI ROBOTIC ASSISTED TOTAL HYSTERECTOMYWITH BILATERAL SALPINGECTOMY;  Surgeon: Christophe Louis, MD;  Location: [facility];  Service: Gynecology;  Laterality: N/A;   TUBAL LIGATION Bilateral 06-01-2002   dr Mancel Bale _1    PPTL    There were no vitals filed for this visit.   Subjective Assessment - 04/15/21 1404     Subjective Pt reports doing okay today.    Patient is accompained by: Family member    Pertinent History medical history of generalized arthritis and myalgias and concern for functional weakness, SLE, irritable bowel, Subarachnoid hemorrhage    Limitations Standing;Walking;Writing;House hold activities;Reading;Lifting    How long can you sit comfortably? no issues    How long  can you stand comfortably? 2 min    How long can you walk comfortably? 5 min    Patient Stated Goals Improve independence    Currently in Pain? Yes    Pain Score 5     Pain Location Arm    Pain Orientation Right    Pain Descriptors / Indicators Aching    Pain Type Chronic pain    Pain Onset More than a month ago    Pain Frequency Constant    Aggravating Factors  movement    Pain Relieving Factors rest    Pain Onset More than a month ago                               [facility] Adult PT Treatment/Exercise - 04/15/21 1405       Transfers   Transfers Sit to Stand;Stand to Sit    Sit to Stand 5: Supervision    Stand to Sit 5: Supervision      Ambulation/Gait   Ambulation/Gait Yes     Ambulation/Gait Assistance 5: Supervision    Ambulation/Gait Assistance Details Verbal cues to try to increase step length and keep [place] moving.    Ambulation Distance (Feet) 230 Feet    Assistive device Rolling Galvan    Gait Pattern Step-through pattern;Decreased step length - right;Decreased step length - left    Ambulation Surface Level;Indoor    Stairs Yes    Stairs Assistance 5: Supervision    Stairs Assistance Details (indicate cue type and reason) 4 steps with right rail only then 4 steps with left rail only    Stair Management Technique One rail Right;One rail Left;Step to pattern    Number of Stairs 8    Height of Stairs 6      Neuro Re-ed    Neuro Re-ed Details  Side stepping along counter 8' x 4 with UE support. Verbal cues to stay up tall and try to increase step length. Occasionally extraneous movements noted.      Exercises   Exercises Other Exercises      Knee/Hip Exercises: Aerobic   Nustep x 10 min level 3 with BLE and right UE. Verbal cues to keep step/min>20. Pt able to keep it moving better today.                       PT Short Term Goals - 04/13/21 1035       PT SHORT TERM GOAL #1   Title Patient will report at least 30% reduction in her daily headaches intensity and frequency to improve overall function    Baseline Constant headache that ranges from 4-9/10 daily. 03/31/21 Deferred as not treating for headaches as referral for brain bleed.    Time 6    Period Weeks    Status Deferred    Target Date 04/14/21      PT SHORT TERM GOAL #2   Title Patient will demo improve cervical extension by 5 deg to improve overall cervical ROM    Baseline 03/31/21 Deferred as not treating neck as referral for brain bleed    Time 6    Period Weeks    Status Deferred    Target Date 04/14/21      PT SHORT TERM GOAL #3   Title Patient will be able to ambulate 460' with RW with SBA without needing  aresting break to improve walking endurance in community  Baseline 115' with RW CGA. 04/13/21 460' supervision with RW    Time 6    Period Weeks    Status Achieved    Target Date 04/14/21      PT SHORT TERM GOAL #4   Title Patient will be able to perform sit to stand without use of UE to improve functional strength in LE    Baseline Definite need of R UE use with sit to stand transfer. 04/13/21 Able to perform without UE support from edge of mat but needs close SBA/CGA for balance.    Time 6    Period Weeks    Status Partially Met    Target Date 04/14/21               PT Long Term Goals - 03/03/21 1035       PT LONG TERM GOAL #1   Title Patient will demo 0.85ms improvement in her 10 meter walk gait speed with RW to progress to community ambulator    Baseline 0.41 m/s with RW    Time 8    Period Weeks    Status New    Target Date 04/28/21      PT LONG TERM GOAL #2   Title Patient will report headache frequency of <2x/week to improve daily functioning    Baseline constant headache, headache everyday    Time 8    Period Weeks    Status New    Target Date 04/28/21      PT LONG TERM GOAL #3   Title Pt will demo TUG score in <20 seconds with RW to improve functional mobility    Baseline 40.7 seconds with RW    Time 8    Period Weeks    Status New    Target Date 04/28/21      PT LONG TERM GOAL #4   Title Pt will be able to perform 5x sit to stand with or without UE support in <20 seconds to improve functional strength with transfers    Baseline 34 sec    Time 8    Period Weeks    Status New    Target Date 04/28/21                   Plan - 04/15/21 1926     Clinical Impression Statement Pt was able to tolerate more continous activity during session with less breaks. Stuck to more functional tasks and pt had less extraneous movements noted.    Personal Factors and Comorbidities Comorbidity 2;Past/Current Experience;Time since onset of injury/illness/exacerbation    Comorbidities medical history of generalized  arthritis and myalgias and concern for functional weakness, SLE, irritable bowel,    Examination-Activity Limitations Bathing;Caring for Others;Carry;Dressing;Hygiene/Grooming;Lift;Locomotion Level;Reach Overhead;Squat;Stairs;Stand;Transfers    Examination-Participation Restrictions Church;Cleaning;Community Activity;Driving;Laundry;Medication Management;Meal Prep;Occupation;Shop;Yard Work;Personal Finances    Stability/Clinical Decision Making Evolving/Moderate complexity    Rehab Potential Good    PT Frequency 2x / week    PT Duration 8 weeks    PT Treatment/Interventions ADLs/Self Care Home Management;Cryotherapy;Moist Heat;Traction;Gait training;Stair training;Functional mobility training;Therapeutic activities;Therapeutic exercise;Balance training;Manual techniques;Orthotic Fit/Training;Patient/family education;Cognitive remediation;Neuromuscular re-education;Passive range of motion;Energy conservation;Vestibular;Visual/perceptual remediation/compensation;Joint Manipulations;Spinal Manipulations    PT Next Visit Plan Continue more functional activities. Continue NuStep for some aerobic activity and strengthening. HEP with family assist.  work on transfers with proper hand placement, standing balance activities, gait training    Consulted and Agree with Plan of Care Patient;Family member/caregiver    Family Member Consulted daughter  Patient will benefit from skilled therapeutic intervention in order to improve the following deficits and impairments:  Abnormal gait, Decreased activity tolerance, Decreased balance, Decreased cognition, Decreased safety awareness, Decreased range of motion, Decreased mobility, Decreased endurance, Decreased strength, Difficulty walking, Dizziness, Impaired flexibility, Increased fascial restricitons, Hypomobility, Impaired tone, Impaired UE functional use, Improper body mechanics, Postural dysfunction, Pain  Visit Diagnosis: Other abnormalities of  gait and mobility  Muscle weakness (generalized)     Problem List Patient Active Problem List   Diagnosis Date Noted   Biceps tendonitis on left 02/10/2021   Vascular headache 08/05/2020   Hyponatremia 07/28/2020   Headache 07/24/2020   Neck pain, musculoskeletal 07/24/2020   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    Subarachnoid bleed ([place]) 07/09/2020   Subarachnoid hemorrhage ([place]) 06/29/2020   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   S/P hysterectomy 12/13/2017   Abnormal laboratory test 09/09/2016   Gait abnormality 08/03/2016   Weakness 08/03/2016   Paresthesia 08/03/2016   S/P cervical spinal fusion 10/23/2015   Hypersomnia 12/08/2010   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   EPIGASTRIC PAIN 10/13/2009   IRRITABLE BOWEL SYNDROME 03/10/2009   BACK PAIN, LUMBAR 07/01/2008   COSTOCHONDRITIS 02/01/2008   VIRAL URI 01/08/2008   CHEST PAIN 01/08/2008   OTHER SPECIFIED EPISODIC MOOD DISORDER 12/25/2007   ARM PAIN 12/25/2007   ANEMIA-NOS 10/02/2007   Essential hypertension 10/02/2007   GERD 10/02/2007   POLYP, GALLBLADDER 10/02/2007   UTERINE POLYP 10/02/2007   OSTEOARTHRITIS 10/02/2007   Dyskinesia 10/02/2007   HEADACHE 10/02/2007   PALPITATIONS 10/02/2007   CARDIAC MURMUR, HX OF 10/02/2007   NEPHROLITHIASIS, HX OF 10/02/2007    Electa Sniff, PT, DPT, NCS 04/15/2021, 7:28 PM  [facility] [address] [place], [place], [address] Phone: [phone]   Fax:  [phone]  Name: Amy Barry MRN: [id number] Date of Birth: 11-13-1970

## 2021-04-15 NOTE — Therapy (Signed)
[facility] [address] [place], [place], [address] Phone: [phone]   Fax:  [phone]  Speech Language Pathology Treatment  Patient Details  Name: Amy Barry MRN: [id number] Date of Birth: 1970/12/09 Referring Provider (SLP): Alger Simons MD   Encounter Date: 04/15/2021   End of Session - 04/15/21 1315     Visit Number 8    Number of Visits 17    Date for SLP Re-Evaluation 05/03/21    SLP Start Time 3    SLP Stop Time  1400    SLP Time Calculation (min) 42 min    Activity Tolerance Patient tolerated treatment well             Past Medical History:  Diagnosis Date   Anxiety    Arthralgia    Generalized weakness    bilateral upper and lower extremities,  walks with cane   GERD (gastroesophageal reflux disease)    Headache(784.0)    Heart murmur    per pt "since childhood, no problems"   History of kidney stones    Hypertension    followed pcp   IBS (irritable bowel syndrome)    Iron deficiency anemia    12-06-2017 per pt recently had IV iron infusion July 2019   Low vitamin D level    Osteoarthritis    pt unsure about this   PVC (premature ventricular contraction)    per pt had holter monitor   SLE (systemic lupus erythematosus) ([place])    rheumotologist-- dr syed   Stroke [facility]) 06/2020   Wears glasses     Past Surgical History:  Procedure Laterality Date   CERVICAL DISC ARTHROPLASTY N/A 10/23/2015   Procedure: Cervical Disc Arthroplasty Cervical six-seven;  Surgeon: Eustace Moore, MD;  Location: [facility];  Service: Neurosurgery;  Laterality: N/A;   D & C HYSTEROSCOPY W/ RESECTION ENDOMETRIAL POLYP  10-13-2003    dr rivard @[facility]    ESOPHAGOGASTRODUODENOSCOPY     for GERD   IR ANGIO INTRA EXTRACRAN SEL COM CAROTID INNOMINATE BILAT MOD SED  07/06/2020   IR ANGIO INTRA EXTRACRAN SEL INTERNAL CAROTID BILAT MOD SED  06/29/2020   IR ANGIO VERTEBRAL SEL VERTEBRAL BILAT MOD SED  06/29/2020    IR ANGIO VERTEBRAL SEL VERTEBRAL BILAT MOD SED  07/06/2020   LAPAROSCOPIC ABDOMINAL EXPLORATION  1996   dx ibs   OVARIAN CYST SURGERY  age 12   x2 cyst   RADIOLOGY WITH ANESTHESIA N/A 06/29/2020   Procedure: IR WITH ANESTHESIA;  Surgeon: Radiologist, Medication, MD;  Location: [place];  Service: Radiology;  Laterality: N/A;   ROBOTIC ASSISTED TOTAL HYSTERECTOMY N/A 12/13/2017   Procedure: XI ROBOTIC ASSISTED TOTAL HYSTERECTOMYWITH BILATERAL SALPINGECTOMY;  Surgeon: Christophe Louis, MD;  Location: [facility];  Service: Gynecology;  Laterality: N/A;   TUBAL LIGATION Bilateral 06-01-2002   dr Mancel Bale @[facility]    PPTL    There were no vitals filed for this visit.   Subjective Assessment - 04/15/21 1319     Subjective "tired"    Patient is accompained by: Family member   [place], daughter   Currently in Pain? Yes    Pain Score 5     Pain Location Arm                   ADULT SLP TREATMENT - 04/15/21 1315       General Information   Behavior/Cognition Alert;Cooperative;Pleasant mood;Lethargic;Distractible;Requires cueing      Treatment Provided   Treatment provided  Cognitive-Linquistic      Cognitive-Linquistic Treatment   Treatment focused on Cognition;Patient/family/caregiver education    Skilled Treatment Pt arrived with daughter. Pt reportedly feeling tired after running errands (initially arrived to therapy at 10:15 initially versus 13:15). Pt did complete reading short phrases (2-3 words) with word finding difficulty endorsed. Word finding is reportedly occuring frequently. SLP targeted naming today for picture and short descriptions. Usual additional processing time required for simple naming tasks. SLP provided occasional phonemic and semantic cues for verbal expression at word level. Intermittent dififculty spelling exhibited requiring mod SLP A. SLP requested pt read silently to self, in which rare rephrasing needed to increase comprehension when concept was mildly more abstract. Pt  quickly fatigued during naming tasks. SLP educated naming strategies, energy conservation strategies, and recommendations for frequent breaks while working as pt will supposedly work self into exhaustion without awareness of physical/mental limitations.      Assessment / Recommendations / Plan   Plan Continue with current plan of care      Progression Toward Goals   Progression toward goals Progressing toward goals              SLP Education - 04/15/21 1623     Education Details naming strategies, energy conservation strategies    Person(s) Educated Patient;Child(ren)    Methods Explanation;Demonstration;Handout;Verbal cues    Comprehension Verbalized understanding;Returned demonstration;Need further instruction;Verbal cues required              SLP Short Term Goals - 04/15/21 1626       SLP SHORT TERM GOAL #1   Title Complete CLQT to determine extent of cognitive deficits.    Status Achieved      SLP SHORT TERM GOAL #2   Title Pt will manage her calendar and appointments with rare min A from family over 2 weeks    Time 1    Period Weeks    Status On-going      SLP SHORT TERM GOAL #3   Title Pt will sort pills into organizer independently with supervision cues from family    Time 1    Period Weeks    Status On-going      SLP SHORT TERM GOAL #4   Title Pt will pay 3 bills with occasional min A from family    Time 1    Period Weeks    Status On-going      SLP SHORT TERM GOAL #5   Title Pt will carryover 3 compensatory strategies to complete 2 IADL's with occasional min A from family    Time 1    Period Weeks    Status On-going              SLP Long Term Goals - 04/15/21 1627       SLP LONG TERM GOAL #1   Title Pt will independently manage her daily schedule, appointments, to do lists over 1 week    Time 5    Period Weeks    Status On-going      SLP LONG TERM GOAL #2   Title Pt will use compensations for organization and attention to pay 5 bills  with rare min A from family    Time 5    Period Weeks    Status On-going      SLP LONG TERM GOAL #3   Title Pt will be independent in medication management with no A from family    Time 5    Period Weeks  Status On-going      SLP LONG TERM GOAL #4   Title Pt will improve score on NeuroQOL Cognitive Function PROM (initial score 39) by 4 points    Time 5    Period Weeks    Status On-going      SLP LONG TERM GOAL #5   Title Pt will carryover compensatory strategies for alternating attention in cooking and IADL's with rare min A from family    Time 5    Period Weeks    Status On-going              Plan - 04/15/21 1315     Clinical Impression Statement Slow processsing and reduced memory persist. Frustration reported re: word finding deficits, which has become evident with recent reading tasks. SLP targeted simple naming tasks (picture naming and descriptions), in which pt benefited from usual extra processing time and occasional semantic and phonemic cues. Therapeutic activities were impacted by fatigue and anxiety intermittently, with usual breaks required secondary to reduced frustration tolerance and poor endurance. Continue skilled ST to maxmize safety, independence, and return to PLOF.    Speech Therapy Frequency 2x / week    Duration 8 weeks    Treatment/Interventions Compensatory strategies;Cueing hierarchy;Functional tasks;Patient/family education;Diet toleration management by SLP;Environmental controls;Cognitive reorganization;Multimodal communcation approach;Compensatory techniques;Internal/external aids;SLP instruction and feedback    Potential to [place]             Patient will benefit from skilled therapeutic intervention in order to improve the following deficits and impairments:   Cognitive communication deficit    Problem List Patient Active Problem List   Diagnosis Date Noted   Biceps tendonitis on left 02/10/2021   Vascular headache  08/05/2020   Hyponatremia 07/28/2020   Headache 07/24/2020   Neck pain, musculoskeletal 07/24/2020   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    Subarachnoid bleed ([place]) 07/09/2020   Subarachnoid hemorrhage ([place]) 06/29/2020   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[place]) 03/04/2019   S/P hysterectomy 12/13/2017   Abnormal laboratory test 09/09/2016   Gait abnormality 08/03/2016   Weakness 08/03/2016   Paresthesia 08/03/2016   S/P cervical spinal fusion 10/23/2015   Hypersomnia 12/08/2010   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   EPIGASTRIC PAIN 10/13/2009   IRRITABLE BOWEL SYNDROME 03/10/2009   BACK PAIN, LUMBAR 07/01/2008   COSTOCHONDRITIS 02/01/2008   VIRAL URI 01/08/2008   CHEST PAIN 01/08/2008   OTHER SPECIFIED EPISODIC MOOD DISORDER 12/25/2007   ARM PAIN 12/25/2007   ANEMIA-NOS 10/02/2007   Essential hypertension 10/02/2007   GERD 10/02/2007   POLYP, GALLBLADDER 10/02/2007   UTERINE POLYP 10/02/2007   OSTEOARTHRITIS 10/02/2007   Dyskinesia 10/02/2007   HEADACHE 10/02/2007   PALPITATIONS 10/02/2007   CARDIAC MURMUR, HX OF 10/02/2007   NEPHROLITHIASIS, HX OF 10/02/2007    Alinda Deem, CCC-SLP 04/15/2021, 4:28 PM  [facility] [address] [place], [place], [address] Phone: [phone]   Fax:  [phone]   Name: Amy Barry MRN: [id number] Date of Birth: 1970/10/22

## 2021-04-15 NOTE — Patient Instructions (Signed)

## 2021-04-19 ENCOUNTER — Other Ambulatory Visit: Payer: Self-pay | Admitting: Physical Medicine & Rehabilitation

## 2021-04-20 ENCOUNTER — Ambulatory Visit: Payer: No Typology Code available for payment source

## 2021-04-20 ENCOUNTER — Ambulatory Visit: Payer: No Typology Code available for payment source | Admitting: Occupational Therapy

## 2021-04-20 ENCOUNTER — Encounter: Payer: Self-pay | Admitting: Occupational Therapy

## 2021-04-20 ENCOUNTER — Other Ambulatory Visit: Payer: Self-pay

## 2021-04-20 VITALS — BP 118/74

## 2021-04-20 DIAGNOSIS — I69054 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: Secondary | ICD-10-CM

## 2021-04-20 DIAGNOSIS — R41844 Frontal lobe and executive function deficit: Secondary | ICD-10-CM

## 2021-04-20 DIAGNOSIS — R41840 Attention and concentration deficit: Secondary | ICD-10-CM

## 2021-04-20 DIAGNOSIS — M25512 Pain in left shoulder: Secondary | ICD-10-CM

## 2021-04-20 DIAGNOSIS — R41841 Cognitive communication deficit: Secondary | ICD-10-CM

## 2021-04-20 DIAGNOSIS — R279 Unspecified lack of coordination: Secondary | ICD-10-CM

## 2021-04-20 DIAGNOSIS — M6281 Muscle weakness (generalized): Secondary | ICD-10-CM

## 2021-04-20 DIAGNOSIS — M25612 Stiffness of left shoulder, not elsewhere classified: Secondary | ICD-10-CM

## 2021-04-20 DIAGNOSIS — R2689 Other abnormalities of gait and mobility: Secondary | ICD-10-CM

## 2021-04-20 NOTE — Therapy (Signed)
[facility] [address] [place], [place], [address] Phone: [phone]   Fax:  [phone]  Physical Therapy Treatment  Patient Details  Name: Amy Barry MRN: [id number] Date of Birth: Jul 30, 1970 Referring Provider (PT): Dr. Naaman Plummer   Encounter Date: 04/20/2021   PT End of Session - 04/20/21 1019     Visit Number 6    Number of Visits 17    Date for PT Re-Evaluation 04/28/21    Authorization Type [employer]    PT Start Time 1017    PT Stop Time 1057    PT Time Calculation (min) 40 min    Equipment Utilized During Treatment Gait belt    Activity Tolerance Patient tolerated treatment well    Behavior During Therapy WFL for tasks assessed/performed             Past Medical History:  Diagnosis Date   Anxiety    Arthralgia    Generalized weakness    bilateral upper and lower extremities,  walks with cane   GERD (gastroesophageal reflux disease)    Headache(784.0)    Heart murmur    per pt "since childhood, no problems"   History of kidney stones    Hypertension    followed pcp   IBS (irritable bowel syndrome)    Iron deficiency anemia    12-06-2017 per pt recently had IV iron infusion July 2019   Low vitamin D level    Osteoarthritis    pt unsure about this   PVC (premature ventricular contraction)    per pt had holter monitor   SLE (systemic lupus erythematosus) ([place])    rheumotologist-- dr syed   Stroke [facility]) 06/2020   Wears glasses     Past Surgical History:  Procedure Laterality Date   CERVICAL DISC ARTHROPLASTY N/A 10/23/2015   Procedure: Cervical Disc Arthroplasty Cervical six-seven;  Surgeon: Eustace Moore, MD;  Location: [facility];  Service: Neurosurgery;  Laterality: N/A;   D & C HYSTEROSCOPY W/ RESECTION ENDOMETRIAL POLYP  10-13-2003    dr rivard '@[facility]'    ESOPHAGOGASTRODUODENOSCOPY     for GERD   IR ANGIO INTRA EXTRACRAN SEL COM CAROTID INNOMINATE BILAT MOD SED  07/06/2020   IR ANGIO INTRA  EXTRACRAN SEL INTERNAL CAROTID BILAT MOD SED  06/29/2020   IR ANGIO VERTEBRAL SEL VERTEBRAL BILAT MOD SED  06/29/2020   IR ANGIO VERTEBRAL SEL VERTEBRAL BILAT MOD SED  07/06/2020   LAPAROSCOPIC ABDOMINAL EXPLORATION  1996   dx ibs   OVARIAN CYST SURGERY  age 16   x2 cyst   RADIOLOGY WITH ANESTHESIA N/A 06/29/2020   Procedure: IR WITH ANESTHESIA;  Surgeon: Radiologist, Medication, MD;  Location: [facility];  Service: Radiology;  Laterality: N/A;   ROBOTIC ASSISTED TOTAL HYSTERECTOMY N/A 12/13/2017   Procedure: XI ROBOTIC ASSISTED TOTAL HYSTERECTOMYWITH BILATERAL SALPINGECTOMY;  Surgeon: Christophe Louis, MD;  Location: [facility];  Service: Gynecology;  Laterality: N/A;   TUBAL LIGATION Bilateral 06-01-2002   dr Mancel Bale '@[facility]'    PPTL    Vitals:   04/20/21 1020  BP: 118/74     Subjective Assessment - 04/20/21 1020     Subjective Pt reports that she has had a headache the last couple days. Pretty constant. Waiting on the tylenol refill. Did take the Maxalt yesterday and helped some but puts her to sleep. Pt straightened her own hair to try to use her arm more. Took a couple hours.    Patient is accompained by: Family member  Pertinent History medical history of generalized arthritis and myalgias and concern for functional weakness, SLE, irritable bowel, Subarachnoid hemorrhage    Limitations Standing;Walking;Writing;House hold activities;Reading;Lifting    How long can you sit comfortably? no issues    How long can you stand comfortably? 2 min    How long can you walk comfortably? 5 min    Patient Stated Goals Improve independence    Currently in Pain? Yes    Pain Score 5     Pain Location Head    Pain Orientation Left    Pain Descriptors / Indicators Headache    Pain Type Acute pain    Pain Onset In the past 7 days    Pain Frequency Constant    Multiple Pain Sites Yes    Pain Score 6    Pain Location Shoulder    Pain Orientation Left    Pain Descriptors / Indicators Aching    Pain Type Chronic  pain    Pain Onset More than a month ago    Pain Frequency Constant    Aggravating Factors  movement    Pain Relieving Factors rest                               [facility] Adult PT Treatment/Exercise - 04/20/21 1023       Transfers   Transfers Sit to Stand;Stand to Sit    Sit to Stand 5: Supervision;With upper extremity assist    Stand to Sit 5: Supervision;With upper extremity assist      Ambulation/Gait   Ambulation/Gait Yes    Ambulation/Gait Assistance 5: Supervision    Ambulation/Gait Assistance Details Pt ambulated first on level path. Then added in stepping over 3 therastones and weaving in and out of 4 cones with RW x 3 bouts during laps. Pt initially started to have extraneous movements noted and poor coordination of steps on 1st pass but did improve some as went on. PT tried not to draw attention to movements and instead instructed to try to keep [place] moving.    Ambulation Distance (Feet) 230 Feet   345' around obstacles   Assistive device Rolling Leider    Gait Pattern Step-through pattern;Decreased step length - right;Decreased step length - left    Ambulation Surface Level;Indoor      Neuro Re-ed    Neuro Re-ed Details  Side stepping in front of mat holding PT hands 6' x 4 min assist. Pt was cued to reset when she would get stuck. Pt had extraneous/ataxic type movements noted throughout. BP=138/82 at end                       PT Short Term Goals - 04/13/21 1035       PT SHORT TERM GOAL #1   Title Patient will report at least 30% reduction in her daily headaches intensity and frequency to improve overall function    Baseline Constant headache that ranges from 4-9/10 daily. 03/31/21 Deferred as not treating for headaches as referral for brain bleed.    Time 6    Period Weeks    Status Deferred    Target Date 04/14/21      PT SHORT TERM GOAL #2   Title Patient will demo improve cervical extension by 5 deg to improve overall cervical  ROM    Baseline 03/31/21 Deferred as not treating neck as referral for brain bleed    Time  6    Period Weeks    Status Deferred    Target Date 04/14/21      PT SHORT TERM GOAL #3   Title Patient will be able to ambulate 10' with RW with SBA without needing  aresting break to improve walking endurance in community    Baseline 115' with RW CGA. 04/13/21 460' supervision with RW    Time 6    Period Weeks    Status Achieved    Target Date 04/14/21      PT SHORT TERM GOAL #4   Title Patient will be able to perform sit to stand without use of UE to improve functional strength in LE    Baseline Definite need of R UE use with sit to stand transfer. 04/13/21 Able to perform without UE support from edge of mat but needs close SBA/CGA for balance.    Time 6    Period Weeks    Status Partially Met    Target Date 04/14/21               PT Long Term Goals - 03/03/21 1035       PT LONG TERM GOAL #1   Title Patient will demo 0.7ms improvement in her 10 meter walk gait speed with RW to progress to community ambulator    Baseline 0.41 m/s with RW    Time 8    Period Weeks    Status New    Target Date 04/28/21      PT LONG TERM GOAL #2   Title Patient will report headache frequency of <2x/week to improve daily functioning    Baseline constant headache, headache everyday    Time 8    Period Weeks    Status New    Target Date 04/28/21      PT LONG TERM GOAL #3   Title Pt will demo TUG score in <20 seconds with RW to improve functional mobility    Baseline 40.7 seconds with RW    Time 8    Period Weeks    Status New    Target Date 04/28/21      PT LONG TERM GOAL #4   Title Pt will be able to perform 5x sit to stand with or without UE support in <20 seconds to improve functional strength with transfers    Baseline 34 sec    Time 8    Period Weeks    Status New    Target Date 04/28/21                   Plan - 04/20/21 1302     Clinical Impression Statement Pt  continues to show more extaneous/ataxic movements if any activity is added to normal activity. Takes a while to settle down when does occur but seems to do best when do not try to draw a lot of attention to movements.    Personal Factors and Comorbidities Comorbidity 2;Past/Current Experience;Time since onset of injury/illness/exacerbation    Comorbidities medical history of generalized arthritis and myalgias and concern for functional weakness, SLE, irritable bowel,    Examination-Activity Limitations Bathing;Caring for Others;Carry;Dressing;Hygiene/Grooming;Lift;Locomotion Level;Reach Overhead;Squat;Stairs;Stand;Transfers    Examination-Participation Restrictions Church;Cleaning;Community Activity;Driving;Laundry;Medication Management;Meal Prep;Occupation;Shop;Yard Work;Personal Finances    Stability/Clinical Decision Making Evolving/Moderate complexity    Rehab Potential Good    PT Frequency 2x / week    PT Duration 8 weeks    PT Treatment/Interventions ADLs/Self Care Home Management;Cryotherapy;Moist Heat;Traction;Gait training;Stair training;Functional mobility training;Therapeutic activities;Therapeutic exercise;Balance training;Manual techniques;Orthotic  Fit/Training;Patient/family education;Cognitive remediation;Neuromuscular re-education;Passive range of motion;Energy conservation;Vestibular;Visual/perceptual remediation/compensation;Joint Manipulations;Spinal Manipulations    PT Next Visit Plan Continue more functional activities. Continue NuStep for some aerobic activity and strengthening. HEP with family assist.  work on transfers with proper hand placement, standing balance activities, gait training    Consulted and Agree with Plan of Care Patient;Family member/caregiver    Family Member Consulted daughter             Patient will benefit from skilled therapeutic intervention in order to improve the following deficits and impairments:  Abnormal gait, Decreased activity tolerance,  Decreased balance, Decreased cognition, Decreased safety awareness, Decreased range of motion, Decreased mobility, Decreased endurance, Decreased strength, Difficulty walking, Dizziness, Impaired flexibility, Increased fascial restricitons, Hypomobility, Impaired tone, Impaired UE functional use, Improper body mechanics, Postural dysfunction, Pain  Visit Diagnosis: Other abnormalities of gait and mobility  Muscle weakness (generalized)     Problem List Patient Active Problem List   Diagnosis Date Noted   Biceps tendonitis on left 02/10/2021   Vascular headache 08/05/2020   Hyponatremia 07/28/2020   Headache 07/24/2020   Neck pain, musculoskeletal 07/24/2020   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    Subarachnoid bleed ([place]) 07/09/2020   Subarachnoid hemorrhage ([place]) 06/29/2020   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[place]) 03/04/2019   S/P hysterectomy 12/13/2017   Abnormal laboratory test 09/09/2016   Gait abnormality 08/03/2016   Weakness 08/03/2016   Paresthesia 08/03/2016   S/P cervical spinal fusion 10/23/2015   Hypersomnia 12/08/2010   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   EPIGASTRIC PAIN 10/13/2009   IRRITABLE BOWEL SYNDROME 03/10/2009   BACK PAIN, LUMBAR 07/01/2008   COSTOCHONDRITIS 02/01/2008   VIRAL URI 01/08/2008   CHEST PAIN 01/08/2008   OTHER SPECIFIED EPISODIC MOOD DISORDER 12/25/2007   ARM PAIN 12/25/2007   ANEMIA-NOS 10/02/2007   Essential hypertension 10/02/2007   GERD 10/02/2007   POLYP, GALLBLADDER 10/02/2007   UTERINE POLYP 10/02/2007   OSTEOARTHRITIS 10/02/2007   Dyskinesia 10/02/2007   HEADACHE 10/02/2007   PALPITATIONS 10/02/2007   CARDIAC MURMUR, HX OF 10/02/2007   NEPHROLITHIASIS, HX OF 10/02/2007    Electa Sniff, PT, DPT, NCS 04/20/2021, 1:06 PM  [facility] [address] [place], [place], [address] Phone: [phone]   Fax:  [phone]  Name: PETRONELLA SHUFORD MRN:  [id number] Date of Birth: [birth year]

## 2021-04-20 NOTE — Therapy (Signed)
[facility] [address] [place], [place], [address] Phone: [phone]   Fax:  [phone]  Speech Language Pathology Treatment  Patient Details  Name: Amy Barry MRN: [id number] Date of Birth: 1970/08/20 Referring Provider (SLP): Alger Simons MD   Encounter Date: 04/20/2021   End of Session - 04/20/21 1145     Visit Number 9    Number of Visits 17    Date for SLP Re-Evaluation 05/03/21    SLP Start Time 9675    SLP Stop Time  9163    SLP Time Calculation (min) 50 min    Activity Tolerance Patient tolerated treatment well             Past Medical History:  Diagnosis Date   Anxiety    Arthralgia    Generalized weakness    bilateral upper and lower extremities,  walks with cane   GERD (gastroesophageal reflux disease)    Headache(784.0)    Heart murmur    per pt "since childhood, no problems"   History of kidney stones    Hypertension    followed pcp   IBS (irritable bowel syndrome)    Iron deficiency anemia    12-06-2017 per pt recently had IV iron infusion July 2019   Low vitamin D level    Osteoarthritis    pt unsure about this   PVC (premature ventricular contraction)    per pt had holter monitor   SLE (systemic lupus erythematosus) ([place])    rheumotologist-- dr syed   Stroke [facility]) 06/2020   Wears glasses     Past Surgical History:  Procedure Laterality Date   CERVICAL DISC ARTHROPLASTY N/A 10/23/2015   Procedure: Cervical Disc Arthroplasty Cervical six-seven;  Surgeon: Eustace Moore, MD;  Location: [facility];  Service: Neurosurgery;  Laterality: N/A;   D & C HYSTEROSCOPY W/ RESECTION ENDOMETRIAL POLYP  10-13-2003    dr rivard '@[facility]'    ESOPHAGOGASTRODUODENOSCOPY     for GERD   IR ANGIO INTRA EXTRACRAN SEL COM CAROTID INNOMINATE BILAT MOD SED  07/06/2020   IR ANGIO INTRA EXTRACRAN SEL INTERNAL CAROTID BILAT MOD SED  06/29/2020   IR ANGIO VERTEBRAL SEL VERTEBRAL BILAT MOD SED  06/29/2020    IR ANGIO VERTEBRAL SEL VERTEBRAL BILAT MOD SED  07/06/2020   LAPAROSCOPIC ABDOMINAL EXPLORATION  1996   dx ibs   OVARIAN CYST SURGERY  age 51   x2 cyst   RADIOLOGY WITH ANESTHESIA N/A 06/29/2020   Procedure: IR WITH ANESTHESIA;  Surgeon: Radiologist, Medication, MD;  Location: [facility];  Service: Radiology;  Laterality: N/A;   ROBOTIC ASSISTED TOTAL HYSTERECTOMY N/A 12/13/2017   Procedure: XI ROBOTIC ASSISTED TOTAL HYSTERECTOMYWITH BILATERAL SALPINGECTOMY;  Surgeon: Christophe Louis, MD;  [facility];  Service: Gynecology;  Laterality: N/A;   TUBAL LIGATION Bilateral 06-01-2002   dr Mancel Bale '@[facility]'    PPTL    There were no vitals filed for this visit.   Subjective Assessment - 04/20/21 1143     Subjective "my head hurts and I'm tired"    Patient is accompained by: Family member   daughter, Debe Coder   Currently in Pain? Yes    Pain Score 7     Pain Location Head                   ADULT SLP TREATMENT - 04/20/21 1144       General Information   Behavior/Cognition Alert;Cooperative;Pleasant mood;Lethargic;Distractible;Requires cueing      Treatment  Provided   Treatment provided Cognitive-Linquistic      Cognitive-Linquistic Treatment   Treatment focused on Cognition;Patient/family/caregiver education    Skilled Treatment Pt stated "I've been so out of sorts." Husband and daughter at home this weekend, in which pt endorsed feeling over-stimulated and overwhelmed. SLP educated external and internal compensatory techniques for energy conservation as pt quickly fatigues and easily becomes overwhelmed, especially with increased cognitive load. In discussion, no overt anomia or pausing/halting exhibited for extended conversation. Given clinical observations for inconsistent fluent speech, anomia, and cognitive difficulties, SLP questioned possible psychological involvement and anxiety. Pt described current  feelings as "stuck in prison or vacuum," feeling "overwhelmed," and the sensation feels  like "it holds me down" which results in impaired ability to converse and/or complete cognitive tasks. SLP recommended pt consult MD re: behaviors and impact on cognitive functioning for medical intervention recommendations. Pt and daughter verbalized understanding.      Assessment / Recommendations / Plan   Plan Continue with current plan of care      Progression Toward Goals   Progression toward goals Not progressing toward goals (comment)   questioning psych involvement             SLP Education - 04/20/21 1303     Education Details external/internal conservation techniques, rec for psych    Person(s) Educated Patient;Child(ren)    Methods Explanation;Demonstration;Verbal cues    Comprehension Verbalized understanding;Returned demonstration;Need further instruction              SLP Short Term Goals - 04/20/21 1146       SLP SHORT TERM GOAL #1   Title Complete CLQT to determine extent of cognitive deficits.    Status Achieved      SLP SHORT TERM GOAL #2   Title Pt will manage her calendar and appointments with rare min A from family over 2 weeks    Status Not Met      SLP SHORT TERM GOAL #3   Title Pt will sort pills into organizer independently with supervision cues from family    Status Not Met      SLP SHORT TERM GOAL #4   Title Pt will pay 3 bills with occasional min A from family    Status Not Met      SLP SHORT TERM GOAL #5   Title Pt will carryover 3 compensatory strategies to complete 2 IADL's with occasional min A from family    Status Partially Met              SLP Long Term Goals - 04/20/21 1309       SLP LONG TERM GOAL #1   Title Pt will independently manage her daily schedule, appointments, to do lists over 1 week    Time 4    Period Weeks    Status On-going      SLP LONG TERM GOAL #2   Title Pt will use compensations for organization and attention to pay 5 bills with rare min A from family    Time 4    Period Weeks    Status  On-going      SLP LONG TERM GOAL #3   Title Pt will be independent in medication management with no A from family    Time 4    Period Weeks    Status On-going      SLP LONG TERM GOAL #4   Title Pt will improve score on NeuroQOL Cognitive Function PROM (initial score 39)  by 4 points    Time 4    Period Weeks    Status On-going      SLP LONG TERM GOAL #5   Title Pt will carryover compensatory strategies for alternating attention in cooking and IADL's with rare min A from family    Time 4    Period Weeks    Status On-going              Plan - 04/20/21 1146     Clinical Impression Statement Slow yet inconsistent processsing, reduced memory, and inconsistent word finding deficits exhibited. In extended conversation today re: recent events and family observations, pt did WNL verbal expression with no anomia, appropriate processing speed, and fluent speech with no overt halting/pausing in conversation. Thus far, structured therapeutic activities have been impacted by fatigue and anxiety, especially when cognitive load increases, with usual breaks required secondary to reduced frustration tolerance and poor endurance. SLP recommended to patient and daughter to f/u with MD re: possible psych consult as SLP questions underlying psychological involvement contributing to observed cognitive communication impairments. Continue skilled ST to maxmize safety, independence, and return to PLOF.    Speech Therapy Frequency 2x / week    Duration 8 weeks    Treatment/Interventions Compensatory strategies;Cueing hierarchy;Functional tasks;Patient/family education;Diet toleration management by SLP;Environmental controls;Cognitive reorganization;Multimodal communcation approach;Compensatory techniques;Internal/external aids;SLP instruction and feedback    Potential to [place]             Patient will benefit from skilled therapeutic intervention in order to improve the following deficits  and impairments:   Cognitive communication deficit    Problem List Patient Active Problem List   Diagnosis Date Noted   Biceps tendonitis on left 02/10/2021   Vascular headache 08/05/2020   Hyponatremia 07/28/2020   Headache 07/24/2020   Neck pain, musculoskeletal 07/24/2020   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    Subarachnoid bleed ([place]) 07/09/2020   Subarachnoid hemorrhage ([place]) 06/29/2020   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[place]) 03/04/2019   S/P hysterectomy 12/13/2017   Abnormal laboratory test 09/09/2016   Gait abnormality 08/03/2016   Weakness 08/03/2016   Paresthesia 08/03/2016   S/P cervical spinal fusion 10/23/2015   Hypersomnia 12/08/2010   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   EPIGASTRIC PAIN 10/13/2009   IRRITABLE BOWEL SYNDROME 03/10/2009   BACK PAIN, LUMBAR 07/01/2008   COSTOCHONDRITIS 02/01/2008   VIRAL URI 01/08/2008   CHEST PAIN 01/08/2008   OTHER SPECIFIED EPISODIC MOOD DISORDER 12/25/2007   ARM PAIN 12/25/2007   ANEMIA-NOS 10/02/2007   Essential hypertension 10/02/2007   GERD 10/02/2007   POLYP, GALLBLADDER 10/02/2007   UTERINE POLYP 10/02/2007   OSTEOARTHRITIS 10/02/2007   Dyskinesia 10/02/2007   HEADACHE 10/02/2007   PALPITATIONS 10/02/2007   CARDIAC MURMUR, HX OF 10/02/2007   NEPHROLITHIASIS, HX OF 10/02/2007    Alinda Deem, CCC-SLP 04/20/2021, 1:11 PM  [facility] [address] [place], [place], [address] Phone: [phone]   Fax:  [phone]   Name: Amy Barry MRN: [id number] Date of Birth: 13-Sep-1970

## 2021-04-20 NOTE — Therapy (Signed)
[facility] [address] [place], [place], [address] Phone: [phone]   Fax:  [phone]  Occupational Therapy Treatment  Patient Details  Name: Amy Barry MRN: [id number] Date of Birth: 1970-08-31 Referring Provider (OT): Oval Linsey, MD   Encounter Date: 04/20/2021   OT End of Session - 04/20/21 1104     Visit Number 13    Number of Visits 17    Date for OT Re-Evaluation 05/12/21    Authorization Type [employer]    Authorization Time Period 120 visits PT/OT, 120 visits ST    Authorization - Number of Visits 52    OT Start Time 1100    OT Stop Time 1145    OT Time Calculation (min) 45 min    Activity Tolerance Patient tolerated treatment well    Behavior During Therapy WFL for tasks assessed/performed             Past Medical History:  Diagnosis Date   Anxiety    Arthralgia    Generalized weakness    bilateral upper and lower extremities,  walks with cane   GERD (gastroesophageal reflux disease)    Headache(784.0)    Heart murmur    per pt "since childhood, no problems"   History of kidney stones    Hypertension    followed pcp   IBS (irritable bowel syndrome)    Iron deficiency anemia    12-06-2017 per pt recently had IV iron infusion July 2019   Low vitamin D level    Osteoarthritis    pt unsure about this   PVC (premature ventricular contraction)    per pt had holter monitor   SLE (systemic lupus erythematosus) ([place])    rheumotologist-- dr syed   Stroke [facility]) 06/2020   Wears glasses     Past Surgical History:  Procedure Laterality Date   CERVICAL DISC ARTHROPLASTY N/A 10/23/2015   Procedure: Cervical Disc Arthroplasty Cervical six-seven;  Surgeon: Eustace Moore, MD;  [facility]: [facility];  Service: Neurosurgery;  Laterality: N/A;   D & C HYSTEROSCOPY W/ RESECTION ENDOMETRIAL POLYP  10-13-2003    dr rivard @[facility]    ESOPHAGOGASTRODUODENOSCOPY     for GERD   IR ANGIO INTRA EXTRACRAN SEL COM  CAROTID INNOMINATE BILAT MOD SED  07/06/2020   IR ANGIO INTRA EXTRACRAN SEL INTERNAL CAROTID BILAT MOD SED  06/29/2020   IR ANGIO VERTEBRAL SEL VERTEBRAL BILAT MOD SED  06/29/2020   IR ANGIO VERTEBRAL SEL VERTEBRAL BILAT MOD SED  07/06/2020   LAPAROSCOPIC ABDOMINAL EXPLORATION  1996   dx ibs   OVARIAN CYST SURGERY  age 56   x2 cyst   RADIOLOGY WITH ANESTHESIA N/A 06/29/2020   Procedure: IR WITH ANESTHESIA;  Surgeon: Radiologist, Medication, MD;  Location: [facility];  Service: Radiology;  Laterality: N/A;   ROBOTIC ASSISTED TOTAL HYSTERECTOMY N/A 12/13/2017   Procedure: XI ROBOTIC ASSISTED TOTAL HYSTERECTOMYWITH BILATERAL SALPINGECTOMY;  Surgeon: Christophe Louis, MD;  Location: [facility];  Service: Gynecology;  Laterality: N/A;   TUBAL LIGATION Bilateral 06-01-2002   dr Mancel Bale @[facility]    PPTL    There were no vitals filed for this visit.   Subjective Assessment - 04/20/21 1102     Subjective  Just a headache and the cats at home doing something bad.    Pertinent History PMH: HTN, IBS, anemia, connective tissue disorder, family history of cerebral aneurysms    Limitations Fall Risk, L shoulder pain, L hemi    Currently in Pain?  Yes    Pain Score 6     Pain Location Head    Pain Orientation Left    Pain Descriptors / Indicators Headache    Pain Type Acute pain    Pain Onset In the past 7 days    Pain Frequency Constant                          OT Treatments/Exercises (OP) - 04/20/21 0001       Bed Mobility   Bed Mobility Left Sidelying to Sit    Left Sidelying to Sit Supervision/Verbal cueing;Contact Guard/Touching assist   pt reqd assistance for getting LUE under body and cueing to extend elbow for pushing up from sidelying.     ADLs   Grooming pt straightened her hair and reports it was painful but it didn't hurt any worse at the end.    ADL Comments pt reports getting self dressed, picking out clothes and doing all self care today.      Neurological Re-education Exercises    Other Exercises 1 supine foam roller exercises x 10 reps chest press, shoulder flexion and horizontal abduction - pt continues to demonstrate guarding and tensing of LUE during any movement.      Functional Reaching Activities   Low Level low level reaching to chair in front of patient with reaching with LUE to place 1 inch blocks. mod cues for reducing guarding and overactivation of LUE shoulder and to sit upright during activity                      OT Short Term Goals - 04/12/21 1840       OT SHORT TERM GOAL #1   Title Pt will be independent with initial HEP for LUE    Time 4    Period Weeks    Status Achieved      OT SHORT TERM GOAL #2   Title Pt will perform UB dressing with pain no greater than 7/10 in LUE shoulder and with no physical assistance    Time 4    Status Achieved      OT SHORT TERM GOAL #3   Title Pt will achieve 90 degrees of AROM shoulder flexion with LUE in order to progress to using LUE for functional reaching activities    Baseline 75* AROM - limited by pain    Time 4    Period Weeks    Status Achieved      OT SHORT TERM GOAL #4   Title Pt will increase grip strength in LUE by 5lbs or greater.    Baseline L 19.6, R 39.4,      1/3 - L 28.8!, R 52.6    Time 4    Period Weeks    Status Achieved      OT SHORT TERM GOAL #5   Title Pt will perform simple warm meal prep and/or light housekeeping with supervision and good safety awareness.    Baseline 1/3 reports helping daughter make a salad, and she reports doing laundry    Time 4    Period Weeks    Status Achieved               OT Long Term Goals - 04/13/21 1200       OT LONG TERM GOAL #1   Title Pt will be independent with updated HEP for LUE    Time 10    Period  Weeks    Status On-going    Target Date 05/12/21      OT LONG TERM GOAL #2   Title Pt will report pain no greater than 6/10 while completing HEP    Time 10    Period Weeks    Status On-going      OT LONG TERM  GOAL #3   Title Pt will increase fine motor coordination in LUE by completing 9 hole peg test in 25 seconds or less.    Baseline R 21.53s, L 32.28s    Time 10    Period Weeks    Status Achieved      OT LONG TERM GOAL #4   Title Pt will demonstrate ability to obtain a lightweight object at 110 degrees shoulder flexion or greater    Baseline 75    Time 10    Period Weeks    Status On-going      OT LONG TERM GOAL #5   Title Pt will increase grip strength in LUE to 30 lbs or greater for increased functional use.    Time 10    Period Weeks    Status Achieved                   Plan - 04/20/21 1148     Clinical Impression Statement Pt indicates increased independence at home and increased progress towards goals.    OT Occupational Profile and History Detailed Assessment- Review of Records and additional review of physical, cognitive, psychosocial history related to current functional performance    Occupational performance deficits (Please refer to evaluation for details): ADL's;IADL's    Body Structure / Function / Physical Skills ADL;Decreased knowledge of use of DME;Strength;Dexterity;GMC;Pain;UE functional use;IADL;ROM;Vision;Flexibility;Sensation;FMC;Coordination    Cognitive Skills Attention;Problem Solve;Understand;Emotional    Rehab Potential Good    Clinical Decision Making Several treatment options, min-mod task modification necessary    Comorbidities Affecting Occupational Performance: May have comorbidities impacting occupational performance    Modification or Assistance to Complete Evaluation  Min-Moderate modification of tasks or assist with assess necessary to complete eval    OT Frequency 2x / week    OT Duration Other (comment)   16 visits over 10 weeks for any scheduling conflicts   OT Treatment/Interventions Self-care/ADL training;Moist Heat;Fluidtherapy;DME and/or AE instruction;Splinting;Therapeutic activities;Traction;Aquatic Therapy;Ultrasound;Therapeutic  exercise;Cognitive remediation/compensation;Visual/perceptual remediation/compensation;Passive range of motion;Functional Mobility Training;Neuromuscular education;Electrical Stimulation;Energy conservation;Manual Therapy;Patient/family education    Plan NMR LUE - increase shoulder range of motion and functional use, aquatics therapy for LUE and balance, simple cooking, one visit over cert period    [place] for balance and shoulder pain, started with seated self PROM to floor - review at first session    Recommended Other Services seeing PT, ST    Consulted and Agree with Plan of Care Patient             Patient will benefit from skilled therapeutic intervention in order to improve the following deficits and impairments:   Body Structure / Function / Physical Skills: ADL, Decreased knowledge of use of DME, Strength, Dexterity, GMC, Pain, UE functional use, IADL, ROM, Vision, Flexibility, Sensation, FMC, Coordination Cognitive Skills: Attention, Problem Solve, Understand, Emotional     Visit Diagnosis: Lack of coordination  Frontal lobe and executive function deficit  Muscle weakness (generalized)  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 (HCC)  Acute pain of left shoulder  Attention and concentration deficit  Stiffness of left shoulder, not elsewhere classified  Problem List Patient Active Problem List   Diagnosis Date Noted   Biceps tendonitis on left 02/10/2021   Vascular headache 08/05/2020   Hyponatremia 07/28/2020   Headache 07/24/2020   Neck pain, musculoskeletal 07/24/2020   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    Subarachnoid bleed ([place]) 07/09/2020   Subarachnoid hemorrhage ([place]) 06/29/2020   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[place]) 03/04/2019   S/P hysterectomy 12/13/2017   Abnormal laboratory test 09/09/2016   Gait abnormality 08/03/2016   Weakness 08/03/2016    Paresthesia 08/03/2016   S/P cervical spinal fusion 10/23/2015   Hypersomnia 12/08/2010   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   EPIGASTRIC PAIN 10/13/2009   IRRITABLE BOWEL SYNDROME 03/10/2009   BACK PAIN, LUMBAR 07/01/2008   COSTOCHONDRITIS 02/01/2008   VIRAL URI 01/08/2008   CHEST PAIN 01/08/2008   OTHER SPECIFIED EPISODIC MOOD DISORDER 12/25/2007   ARM PAIN 12/25/2007   ANEMIA-NOS 10/02/2007   Essential hypertension 10/02/2007   GERD 10/02/2007   POLYP, GALLBLADDER 10/02/2007   UTERINE POLYP 10/02/2007   OSTEOARTHRITIS 10/02/2007   Dyskinesia 10/02/2007   HEADACHE 10/02/2007   PALPITATIONS 10/02/2007   CARDIAC MURMUR, HX OF 10/02/2007   NEPHROLITHIASIS, HX OF 10/02/2007    Zachery Conch, OT 04/20/2021, 11:49 AM  [facility] [address] [place], [place], [address] Phone: [phone]   Fax:  [phone]  Name: GREGG HOLSTER MRN: [id number] Date of Birth: [birth year]

## 2021-04-22 ENCOUNTER — Ambulatory Visit: Payer: No Typology Code available for payment source

## 2021-04-22 ENCOUNTER — Ambulatory Visit: Payer: No Typology Code available for payment source | Admitting: Occupational Therapy

## 2021-04-27 ENCOUNTER — Ambulatory Visit: Payer: No Typology Code available for payment source | Admitting: Occupational Therapy

## 2021-04-27 ENCOUNTER — Ambulatory Visit: Payer: No Typology Code available for payment source

## 2021-04-29 ENCOUNTER — Ambulatory Visit: Payer: No Typology Code available for payment source | Admitting: Occupational Therapy

## 2021-04-29 ENCOUNTER — Telehealth: Payer: Self-pay

## 2021-04-29 ENCOUNTER — Ambulatory Visit: Payer: No Typology Code available for payment source

## 2021-04-29 NOTE — Telephone Encounter (Signed)
PT called pt per her request after she cancelled today due to being sick. Left message as went straight to voicemail. Let her know when next PT visit was scheduled for next Tuesday. Cherly Anderson, PT, DPT, NCS

## 2021-05-01 ENCOUNTER — Other Ambulatory Visit: Payer: Self-pay | Admitting: Physical Medicine & Rehabilitation

## 2021-05-01 DIAGNOSIS — G441 Vascular headache, not elsewhere classified: Secondary | ICD-10-CM

## 2021-05-01 DIAGNOSIS — I609 Nontraumatic subarachnoid hemorrhage, unspecified: Secondary | ICD-10-CM

## 2021-05-04 ENCOUNTER — Ambulatory Visit: Payer: No Typology Code available for payment source

## 2021-05-04 ENCOUNTER — Ambulatory Visit: Payer: No Typology Code available for payment source | Admitting: Occupational Therapy

## 2021-05-04 ENCOUNTER — Other Ambulatory Visit: Payer: Self-pay

## 2021-05-04 ENCOUNTER — Encounter: Payer: Self-pay | Admitting: Occupational Therapy

## 2021-05-04 DIAGNOSIS — M6281 Muscle weakness (generalized): Secondary | ICD-10-CM

## 2021-05-04 DIAGNOSIS — R41844 Frontal lobe and executive function deficit: Secondary | ICD-10-CM

## 2021-05-04 DIAGNOSIS — I69054 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: Secondary | ICD-10-CM | POA: Diagnosis not present

## 2021-05-04 DIAGNOSIS — R2689 Other abnormalities of gait and mobility: Secondary | ICD-10-CM

## 2021-05-04 DIAGNOSIS — M25512 Pain in left shoulder: Secondary | ICD-10-CM

## 2021-05-04 DIAGNOSIS — R41840 Attention and concentration deficit: Secondary | ICD-10-CM

## 2021-05-04 DIAGNOSIS — M25612 Stiffness of left shoulder, not elsewhere classified: Secondary | ICD-10-CM

## 2021-05-04 DIAGNOSIS — R41841 Cognitive communication deficit: Secondary | ICD-10-CM

## 2021-05-04 DIAGNOSIS — R279 Unspecified lack of coordination: Secondary | ICD-10-CM

## 2021-05-04 NOTE — Therapy (Signed)
[facility] [address] [place], [place], [address] Phone: [phone]   Fax:  [phone]  Occupational Therapy Treatment  Patient Details  Name: Amy Barry MRN: [id number] Date of Birth: July 11, 1970 Referring Provider (OT): Oval Linsey, MD   Encounter Date: 05/04/2021   OT End of Session - 05/04/21 1108     Visit Number 14    Number of Visits 17    Date for OT Re-Evaluation 05/12/21    Authorization Type Aetna    Authorization Time Period 120 visits PT/OT, 120 visits ST    Authorization - Number of Visits 53    OT Start Time 1105    OT Stop Time 1145    OT Time Calculation (min) 40 min    Activity Tolerance Patient tolerated treatment well    Behavior During Therapy WFL for tasks assessed/performed             Past Medical History:  Diagnosis Date   Anxiety    Arthralgia    Generalized weakness    bilateral upper and lower extremities,  walks with cane   GERD (gastroesophageal reflux disease)    Headache(784.0)    Heart murmur    per pt "since childhood, no problems"   History of kidney stones    Hypertension    followed pcp   IBS (irritable bowel syndrome)    Iron deficiency anemia    12-06-2017 per pt recently had IV iron infusion July 2019   Low vitamin D level    Osteoarthritis    pt unsure about this   PVC (premature ventricular contraction)    per pt had holter monitor   SLE (systemic lupus erythematosus) ([place])    rheumotologist-- dr syed   Stroke [facility]) 06/2020   Wears glasses     Past Surgical History:  Procedure Laterality Date   CERVICAL DISC ARTHROPLASTY N/A 10/23/2015   Procedure: Cervical Disc Arthroplasty Cervical six-seven;  Surgeon: Eustace Moore, MD;  [facility]: [facility];  Service: Neurosurgery;  Laterality: N/A;   D & C HYSTEROSCOPY W/ RESECTION ENDOMETRIAL POLYP  10-13-2003    dr rivard @[facility]    ESOPHAGOGASTRODUODENOSCOPY     for GERD   IR ANGIO INTRA EXTRACRAN SEL COM  CAROTID INNOMINATE BILAT MOD SED  07/06/2020   IR ANGIO INTRA EXTRACRAN SEL INTERNAL CAROTID BILAT MOD SED  06/29/2020   IR ANGIO VERTEBRAL SEL VERTEBRAL BILAT MOD SED  06/29/2020   IR ANGIO VERTEBRAL SEL VERTEBRAL BILAT MOD SED  07/06/2020   LAPAROSCOPIC ABDOMINAL EXPLORATION  1996   dx ibs   OVARIAN CYST SURGERY  age 51   x2 cyst   RADIOLOGY WITH ANESTHESIA N/A 06/29/2020   Procedure: IR WITH ANESTHESIA;  Surgeon: Radiologist, Medication, MD;  Location: [facility];  Service: Radiology;  Laterality: N/A;   ROBOTIC ASSISTED TOTAL HYSTERECTOMY N/A 12/13/2017   Procedure: XI ROBOTIC ASSISTED TOTAL HYSTERECTOMYWITH BILATERAL SALPINGECTOMY;  Surgeon: Christophe Louis, MD;  Location: [facility];  Service: Gynecology;  Laterality: N/A;   TUBAL LIGATION Bilateral 06-01-2002   dr Mancel Bale @[facility]    PPTL    There were no vitals filed for this visit.   Subjective Assessment - 05/04/21 1107     Subjective  reports it's going "better"    Pertinent History PMH: HTN, IBS, anemia, connective tissue disorder, family history of cerebral aneurysms    Limitations Fall Risk, L shoulder pain, L hemi    Currently in Pain? Yes    Pain Score 5  Pain Location Arm    Pain Orientation Left    Pain Descriptors / Indicators Aching    Pain Type Chronic pain    Pain Onset More than a month ago    Pain Frequency Constant              Resistance Clothespins mid/high level reaching with LUE - mod cues for decreasing compensatory movements and shoulder hiking. Increased time req'd d/t fatigue.  Low level reaching at table top with semi circle pegboard with LUE with mod cueing for technique and movement patterns.                       OT Short Term Goals - 04/12/21 1840       OT SHORT TERM GOAL #1   Title Pt will be independent with initial HEP for LUE    Time 4    Period Weeks    Status Achieved      OT SHORT TERM GOAL #2   Title Pt will perform UB dressing with pain no greater than 7/10 in LUE shoulder  and with no physical assistance    Time 4    Status Achieved      OT SHORT TERM GOAL #3   Title Pt will achieve 90 degrees of AROM shoulder flexion with LUE in order to progress to using LUE for functional reaching activities    Baseline 75* AROM - limited by pain    Time 4    Period Weeks    Status Achieved      OT SHORT TERM GOAL #4   Title Pt will increase grip strength in LUE by 5lbs or greater.    Baseline L 19.6, R 39.4,      1/3 - L 28.8!, R 52.6    Time 4    Period Weeks    Status Achieved      OT SHORT TERM GOAL #5   Title Pt will perform simple warm meal prep and/or light housekeeping with supervision and good safety awareness.    Baseline 1/3 reports helping daughter make a salad, and she reports doing laundry    Time 4    Period Weeks    Status Achieved               OT Long Term Goals - 04/13/21 1200       OT LONG TERM GOAL #1   Title Pt will be independent with updated HEP for LUE    Time 10    Period Weeks    Status On-going    Target Date 05/12/21      OT LONG TERM GOAL #2   Title Pt will report pain no greater than 6/10 while completing HEP    Time 10    Period Weeks    Status On-going      OT LONG TERM GOAL #3   Title Pt will increase fine motor coordination in LUE by completing 9 hole peg test in 25 seconds or less.    Baseline R 21.53s, L 32.28s    Time 10    Period Weeks    Status Achieved      OT LONG TERM GOAL #4   Title Pt will demonstrate ability to obtain a lightweight object at 110 degrees shoulder flexion or greater    Baseline 75    Time 10    Period Weeks    Status On-going      OT LONG TERM  GOAL #5   Title Pt will increase grip strength in LUE to 30 lbs or greater for increased functional use.    Time 10    Period Weeks    Status Achieved                   Plan - 05/04/21 1124     Clinical Impression Statement Pt indicates increased independence at home and increased progress towards goals.    OT  Occupational Profile and History Detailed Assessment- Review of Records and additional review of physical, cognitive, psychosocial history related to current functional performance    Occupational performance deficits (Please refer to evaluation for details): ADL's;IADL's    Body Structure / Function / Physical Skills ADL;Decreased knowledge of use of DME;Strength;Dexterity;GMC;Pain;UE functional use;IADL;ROM;Vision;Flexibility;Sensation;FMC;Coordination    Cognitive Skills Attention;Problem Solve;Understand;Emotional    Rehab Potential Good    Clinical Decision Making Several treatment options, min-mod task modification necessary    Comorbidities Affecting Occupational Performance: May have comorbidities impacting occupational performance    Modification or Assistance to Complete Evaluation  Min-Moderate modification of tasks or assist with assess necessary to complete eval    OT Frequency 2x / week    OT Duration Other (comment)   16 visits over 10 weeks for any scheduling conflicts   OT Treatment/Interventions Self-care/ADL training;Moist Heat;Fluidtherapy;DME and/or AE instruction;Splinting;Therapeutic activities;Traction;Aquatic Therapy;Ultrasound;Therapeutic exercise;Cognitive remediation/compensation;Visual/perceptual remediation/compensation;Passive range of motion;Functional Mobility Training;Neuromuscular education;Electrical Stimulation;Energy conservation;Manual Therapy;Patient/family education    Plan discharge next session    OT [place] for balance and shoulder pain, started with seated self PROM to floor - review at first session    Recommended Other Services seeing PT, ST    Consulted and Agree with Plan of Care Patient             Patient will benefit from skilled therapeutic intervention in order to improve the following deficits and impairments:   Body Structure / Function / Physical Skills: ADL, Decreased knowledge of use of DME, Strength, Dexterity,  GMC, Pain, UE functional use, IADL, ROM, Vision, Flexibility, Sensation, FMC, Coordination Cognitive Skills: Attention, Problem Solve, Understand, Emotional     Visit Diagnosis: Lack of coordination  Frontal lobe and executive function deficit  Muscle weakness (generalized)  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 (HCC)  Acute pain of left shoulder  Attention and concentration deficit  Stiffness of left shoulder, not elsewhere classified  Other abnormalities of gait and mobility    Problem List Patient Active Problem List   Diagnosis Date Noted   Biceps tendonitis on left 02/10/2021   Vascular headache 08/05/2020   Hyponatremia 07/28/2020   Headache 07/24/2020   Neck pain, musculoskeletal 07/24/2020   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    Subarachnoid bleed ([place]) 07/09/2020   Subarachnoid hemorrhage ([place]) 06/29/2020   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[place]) 03/04/2019   S/P hysterectomy 12/13/2017   Abnormal laboratory test 09/09/2016   Gait abnormality 08/03/2016   Weakness 08/03/2016   Paresthesia 08/03/2016   S/P cervical spinal fusion 10/23/2015   Hypersomnia 12/08/2010   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   EPIGASTRIC PAIN 10/13/2009   IRRITABLE BOWEL SYNDROME 03/10/2009   BACK PAIN, LUMBAR 07/01/2008   COSTOCHONDRITIS 02/01/2008   VIRAL URI 01/08/2008   CHEST PAIN 01/08/2008   OTHER SPECIFIED EPISODIC MOOD DISORDER 12/25/2007   ARM PAIN 12/25/2007   ANEMIA-NOS 10/02/2007   Essential hypertension 10/02/2007   GERD 10/02/2007   POLYP, GALLBLADDER 10/02/2007   UTERINE POLYP  10/02/2007   OSTEOARTHRITIS 10/02/2007   Dyskinesia 10/02/2007   HEADACHE 10/02/2007   PALPITATIONS 10/02/2007   CARDIAC MURMUR, HX OF 10/02/2007   NEPHROLITHIASIS, HX OF 10/02/2007    Zachery Conch, OT 05/04/2021, 12:30 PM  [facility] [address] [place], [place], [address] Phone: [phone]   Fax:  [phone]  Name: Amy Barry MRN: [id number] Date of Birth: 1970-11-28

## 2021-05-04 NOTE — Therapy (Signed)
[facility] [address] [place], [place], [address] Phone: [phone]   Fax:  [phone]  Speech Language Pathology Treatment  Patient Details  Name: Amy Barry MRN: [id number] Date of Birth: 11/02/1970 Referring Provider (SLP): Alger Simons MD   Encounter Date: 05/04/2021   End of Session - 05/04/21 1307     Visit Number 10    Number of Visits 17    Date for SLP Re-Evaluation 05/03/21    SLP Start Time 1146    SLP Stop Time  6962    SLP Time Calculation (min) 48 min    Activity Tolerance Patient tolerated treatment well             Past Medical History:  Diagnosis Date   Anxiety    Arthralgia    Generalized weakness    bilateral upper and lower extremities,  walks with cane   GERD (gastroesophageal reflux disease)    Headache(784.0)    Heart murmur    per pt "since childhood, no problems"   History of kidney stones    Hypertension    followed pcp   IBS (irritable bowel syndrome)    Iron deficiency anemia    12-06-2017 per pt recently had IV iron infusion July 2019   Low vitamin D level    Osteoarthritis    pt unsure about this   PVC (premature ventricular contraction)    per pt had holter monitor   SLE (systemic lupus erythematosus) ([place])    rheumotologist-- dr syed   Stroke [facility]) 06/2020   Wears glasses     Past Surgical History:  Procedure Laterality Date   CERVICAL DISC ARTHROPLASTY N/A 10/23/2015   Procedure: Cervical Disc Arthroplasty Cervical six-seven;  Surgeon: Eustace Moore, MD;  Location: [facility];  Service: Neurosurgery;  Laterality: N/A;   D & C HYSTEROSCOPY W/ RESECTION ENDOMETRIAL POLYP  10-13-2003    dr rivard _0    ESOPHAGOGASTRODUODENOSCOPY     for GERD   IR ANGIO INTRA EXTRACRAN SEL COM CAROTID INNOMINATE BILAT MOD SED  07/06/2020   IR ANGIO INTRA EXTRACRAN SEL INTERNAL CAROTID BILAT MOD SED  06/29/2020   IR ANGIO VERTEBRAL SEL VERTEBRAL BILAT MOD SED  06/29/2020    IR ANGIO VERTEBRAL SEL VERTEBRAL BILAT MOD SED  07/06/2020   LAPAROSCOPIC ABDOMINAL EXPLORATION  1996   dx ibs   OVARIAN CYST SURGERY  age 13   x2 cyst   RADIOLOGY WITH ANESTHESIA N/A 06/29/2020   Procedure: IR WITH ANESTHESIA;  Surgeon: Radiologist, Medication, MD;  Location: [facility];  Service: Radiology;  Laterality: N/A;   ROBOTIC ASSISTED TOTAL HYSTERECTOMY N/A 12/13/2017   Procedure: XI ROBOTIC ASSISTED TOTAL HYSTERECTOMYWITH BILATERAL SALPINGECTOMY;  Surgeon: Christophe Louis, MD;  Location: [facility];  Service: Gynecology;  Laterality: N/A;   TUBAL LIGATION Bilateral 06-01-2002   dr Mancel Bale _1    PPTL    There were no vitals filed for this visit.   Subjective Assessment - 05/04/21 1149     Subjective "I got over stimulated"    Patient is accompained by: Family member   daughter   Currently in Pain? Yes    Pain Score 5     Pain Location Arm                   ADULT SLP TREATMENT - 05/04/21 1149       General Information   Behavior/Cognition Alert;Cooperative;Pleasant mood;Lethargic;Distractible;Requires cueing      Treatment Provided  Treatment provided Cognitive-Linquistic      Cognitive-Linquistic Treatment   Treatment focused on Cognition;Patient/family/caregiver education    Skilled Treatment Pt entered with limited verbal output which required usual prompting from daughter. No further difficulty communicating in conversation exhibited. Pt endorsed becoming overstimulated on the drive to therapy as she has not left the house in two weeks. Episodes x2 reported of patient becoming limp and nearly falling. SLP suggested consulting MD tomorrow about these episodes, as well as ongoing episodes of getting overwhelmed/overstimulated which impact cognitive and physical functioning. 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 Pt verbalized various job responsibilities  with occasional A from daughter. Pt believed she would be unable to complete these tasks at this time, especially if she becomes stressed. Pt endorsed her "comprehension is off" and "I can't remember anything" in which daughter indicated this has be occuring for years (seemingly functional neurological disorder). SLP recommended modifying or implementing cognitive strategies consistently to optimize usage. Pt and daughter verbalized understanding and agreement.      Assessment / Recommendations / Plan   Plan Continue with current plan of care      Progression Toward Goals   Progression toward goals Not progressing toward goals              SLP Education - 05/04/21 1307     Education Details external strategies, repetition is key for memory    Person(s) Educated Patient;Child(ren)    Methods Explanation;Demonstration;Verbal cues    Comprehension Verbalized understanding;Returned demonstration;Need further instruction              SLP Short Term Goals - 04/20/21 1146       SLP SHORT TERM GOAL #1   Title Complete CLQT to determine extent of cognitive deficits.    Status Achieved      SLP SHORT TERM GOAL #2   Title Pt will manage her calendar and appointments with rare min A from family over 2 weeks    Status Not Met      SLP SHORT TERM GOAL #3   Title Pt will sort pills into organizer independently with supervision cues from family    Status Not Met      SLP SHORT TERM GOAL #4   Title Pt will pay 3 bills with occasional min A from family    Status Not Met      SLP SHORT TERM GOAL #5   Title Pt will carryover 3 compensatory strategies to complete 2 IADL's with occasional min A from family    Status Partially Met              SLP Long Term Goals - 05/04/21 1308       SLP LONG TERM GOAL #1   Title Pt will independently manage her daily schedule, appointments, to do lists over 1 week    Time 3    Period Weeks    Status On-going      SLP LONG TERM GOAL #2    Title Pt will use compensations for organization and attention to pay 5 bills with rare min A from family    Time 3    Period Weeks    Status On-going      SLP LONG TERM GOAL #3   Title Pt will be independent in medication management with no A from family    Time 3    Period  Weeks    Status On-going      SLP LONG TERM GOAL #4   Title Pt will improve score on NeuroQOL Cognitive Function PROM (initial score 39) by 4 points    Time 3    Period Weeks    Status On-going      SLP LONG TERM GOAL #5   Title Pt will carryover compensatory strategies for alternating attention in cooking and IADL's with rare min A from family    Time 3    Period Weeks    Status On-going              Plan - 05/04/21 1308     Clinical Impression Statement Slow yet inconsistent processsing, reduced memory, and inconsistent word finding deficits exhibited. In extended conversation today re: recent events and family observations, pt exhibited WNL verbal expression with no overt anomia, occasional reduced processing speed, and overall fluent speech with no significant halting/pausing in conversation. Thus far, structured therapeutic activities have been impacted by fatigue and anxiety, especially when cognitive load increases, with usual breaks required secondary to reduced frustration tolerance and poor endurance. SLP recommended to patient and daughter to f/u with MD re: possible psych consult as SLP questions underlying psychological involvement contributing to observed cognitive communication impairments. Continue skilled ST to maxmize safety, independence, and return to PLOF.    Speech Therapy Frequency 2x / week    Duration 8 weeks    Treatment/Interventions Compensatory strategies;Cueing hierarchy;Functional tasks;Patient/family education;Diet toleration management by SLP;Environmental controls;Cognitive reorganization;Multimodal communcation approach;Compensatory techniques;Internal/external aids;SLP  instruction and feedback    Potential to [place]             Patient will benefit from skilled therapeutic intervention in order to improve the following deficits and impairments:   Cognitive communication deficit    Problem List Patient Active Problem List   Diagnosis Date Noted   Biceps tendonitis on left 02/10/2021   Vascular headache 08/05/2020   Hyponatremia 07/28/2020   Headache 07/24/2020   Neck pain, musculoskeletal 07/24/2020   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    Subarachnoid bleed ([place]) 07/09/2020   Subarachnoid hemorrhage ([place]) 06/29/2020   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[place]) 03/04/2019   S/P hysterectomy 12/13/2017   Abnormal laboratory test 09/09/2016   Gait abnormality 08/03/2016   Weakness 08/03/2016   Paresthesia 08/03/2016   S/P cervical spinal fusion 10/23/2015   Hypersomnia 12/08/2010   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   EPIGASTRIC PAIN 10/13/2009   IRRITABLE BOWEL SYNDROME 03/10/2009   BACK PAIN, LUMBAR 07/01/2008   COSTOCHONDRITIS 02/01/2008   VIRAL URI 01/08/2008   CHEST PAIN 01/08/2008   OTHER SPECIFIED EPISODIC MOOD DISORDER 12/25/2007   ARM PAIN 12/25/2007   ANEMIA-NOS 10/02/2007   Essential hypertension 10/02/2007   GERD 10/02/2007   POLYP, GALLBLADDER 10/02/2007   UTERINE POLYP 10/02/2007   OSTEOARTHRITIS 10/02/2007   Dyskinesia 10/02/2007   HEADACHE 10/02/2007   PALPITATIONS 10/02/2007   CARDIAC MURMUR, HX OF 10/02/2007   NEPHROLITHIASIS, HX OF 10/02/2007    Alinda Deem, CCC-SLP 05/04/2021, 1:09 PM  [facility] [address] [place], [place], [address] Phone: [phone]   Fax:  [phone]   Name: Amy Barry MRN: [id number] Date of Birth: 07-18-1970

## 2021-05-05 ENCOUNTER — Encounter
Payer: No Typology Code available for payment source | Attending: Physical Medicine & Rehabilitation | Admitting: Physical Medicine & Rehabilitation

## 2021-05-05 ENCOUNTER — Encounter: Payer: Self-pay | Admitting: Physical Medicine & Rehabilitation

## 2021-05-05 ENCOUNTER — Other Ambulatory Visit: Payer: Self-pay

## 2021-05-05 VITALS — BP 131/73 | HR 69 | Temp 98.7°F | Ht 61.0 in | Wt 166.0 lb

## 2021-05-05 DIAGNOSIS — R4189 Other symptoms and signs involving cognitive functions and awareness: Secondary | ICD-10-CM | POA: Insufficient documentation

## 2021-05-05 DIAGNOSIS — M7522 Bicipital tendinitis, left shoulder: Secondary | ICD-10-CM | POA: Diagnosis not present

## 2021-05-05 DIAGNOSIS — G441 Vascular headache, not elsewhere classified: Secondary | ICD-10-CM | POA: Insufficient documentation

## 2021-05-05 DIAGNOSIS — F411 Generalized anxiety disorder: Secondary | ICD-10-CM | POA: Insufficient documentation

## 2021-05-05 MED ORDER — METHYLPHENIDATE HCL 5 MG PO TABS: 5.0000 mg | ORAL_TABLET | Freq: Two times a day (BID) | ORAL | 0 refills | Status: DC

## 2021-05-05 MED ORDER — ESCITALOPRAM OXALATE 5 MG PO TABS: 5.0000 mg | ORAL_TABLET | Freq: Every day | ORAL | 4 refills | Status: DC

## 2021-05-05 NOTE — Progress Notes (Signed)
Subjective:    Patient ID: Amy Barry, female    DOB: [birth year], 51 y.o.   MRN: [id number]  HPI Dempsey is here in follow up of her [place]. She reports ongoing attention and anxiety issues especially when she's out in public. Things are going to fast for her and that she "can't keep up".  She tells me that she went to a movie which was 3D movie recently with her family and that she did well in the movie but then decompensated when she came out.  She often feels anxious, tired and depressed as well.  She feels that her husband is pushing to do more more even at times where she is not up to the task and sometimes from what she and her daughter are describing he does it in a very aggressive manner and and tries to encourage her to participate in the situation which is overwhelming for her.  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   From a positive standpoint, her left shoulder is feeling better after working with therapy.  She is now ambulating with a rolling [place] and making nice gains overall..   She still a bit apprehensive how things work are going to turn out from here.  She has been in communication with her work about FMLA when she might be able to return to work at some point.  Pain Inventory Average Pain 7 Pain Right Now 7 My pain is constant, dull, and aching  LOCATION OF PAIN  left arm, headache  BOWEL Number of stools per week: 1-2 Oral laxative use No  Type of laxative miralax    BLADDER Normal   Mobility walk with assistance use a [place] ability to climb steps?  yes do you drive?  no use a wheelchair transfers alone Do you have any goals in this area?  yes  Function employed # of hrs/week long-term disability from work disabled: date disabled applied I need assistance with the following:  meal prep, household duties, and shopping Do you have any goals in this area?  yes  Neuro/Psych weakness numbness tremor tingling trouble  walking spasms dizziness confusion depression anxiety  Prior Studies Any changes since last visit?  no  Physicians involved in your care Any changes since last visit?  no   Family History  Problem Relation Age of Onset   Hypertension Mother        hx aneursym and surgery   Atrial fibrillation Mother    Sarcoidosis Mother    Anuerysm Mother        brain   Arthritis Mother    Healthy Father    Asthma Sister    Leukemia Maternal Grandmother    Heart disease Maternal Grandmother    Stroke Paternal Grandmother    Hypertension Daughter    Colon polyps Daughter 77       hermatoma oversized polyps-benign   Hypertension Daughter    Hyperlipidemia Other    Colon cancer Neg Hx    Social History   Socioeconomic History   Marital status: Married    Spouse name: Not on file   Number of children: 2   Years of education: College   Highest education level: Not on file  Occupational History   Occupation: Programmer, multimedia: [employer]  Tobacco Use   Smoking status: Never   Smokeless tobacco: Never  Vaping Use   Vaping Use: Never used  Substance and Sexual Activity   Alcohol  use: No    Alcohol/week: 0.0 standard drinks   Drug use: Never   Sexual activity: Yes    Partners: Male    Birth control/protection: Surgical  Other Topics Concern   Not on file  Social History Narrative   Lives at home with husband.   Right-handed.   Occasional caffeine use.   Social Determinants of Health   Financial Resource Strain: Not on file  Food Insecurity: Not on file  Transportation Needs: Not on file  Physical Activity: Not on file  Stress: Not on file  Social Connections: Not on file   Past Surgical History:  Procedure Laterality Date   CERVICAL DISC ARTHROPLASTY N/A 10/23/2015   Procedure: Cervical Disc Arthroplasty Cervical six-seven;  Surgeon: Eustace Moore, MD;  Location: [facility];  Service: Neurosurgery;  Laterality: N/A;   D & C HYSTEROSCOPY W/ RESECTION  ENDOMETRIAL POLYP  10-13-2003    dr rivard @[facility]    ESOPHAGOGASTRODUODENOSCOPY     for GERD   IR ANGIO INTRA EXTRACRAN SEL COM CAROTID INNOMINATE BILAT MOD SED  07/06/2020   IR ANGIO INTRA EXTRACRAN SEL INTERNAL CAROTID BILAT MOD SED  06/29/2020   IR ANGIO VERTEBRAL SEL VERTEBRAL BILAT MOD SED  06/29/2020   IR ANGIO VERTEBRAL SEL VERTEBRAL BILAT MOD SED  07/06/2020   LAPAROSCOPIC ABDOMINAL EXPLORATION  1996   dx ibs   OVARIAN CYST SURGERY  age 80   x2 cyst   RADIOLOGY WITH ANESTHESIA N/A 06/29/2020   Procedure: IR WITH ANESTHESIA;  Surgeon: Radiologist, Medication, MD;  Location: [facility];  Service: Radiology;  Laterality: N/A;   ROBOTIC ASSISTED TOTAL HYSTERECTOMY N/A 12/13/2017   Procedure: XI ROBOTIC ASSISTED TOTAL HYSTERECTOMYWITH BILATERAL SALPINGECTOMY;  Surgeon: Christophe Louis, MD;  Location: [facility];  Service: Gynecology;  Laterality: N/A;   TUBAL LIGATION Bilateral 06-01-2002   dr Mancel Bale @[facility]    PPTL   Past Medical History:  Diagnosis Date   Anxiety    Arthralgia    Generalized weakness    bilateral upper and lower extremities,  walks with cane   GERD (gastroesophageal reflux disease)    Headache(784.0)    Heart murmur    per pt "since childhood, no problems"   History of kidney stones    Hypertension    followed pcp   IBS (irritable bowel syndrome)    Iron deficiency anemia    12-06-2017 per pt recently had IV iron infusion July 2019   Low vitamin D level    Osteoarthritis    pt unsure about this   PVC (premature ventricular contraction)    per pt had holter monitor   SLE (systemic lupus erythematosus) ([place])    rheumotologist-- dr syed   Stroke [facility]) 06/2020   Wears glasses    BP 131/73    Pulse 69    Temp 98.7 F (37.1 C)    Ht 5\' 1"  (1.549 m)    Wt 166 lb (75.3 kg)    LMP 12/22/2017    SpO2 98%    BMI 31.37 kg/m   Opioid Risk Score:   Fall Risk Score:  `1  Depression screen PHQ 2/9  Depression screen [facility] 2/9 02/10/2021 11/04/2020 08/05/2020 03/04/2019  Decreased Interest 0 0  0 0  Down, Depressed, Hopeless 0 0 0 0  PHQ - 2 Score 0 0 0 0  Some recent data might be hidden    Review of Systems  Musculoskeletal:  Positive for gait problem.       Left arm weakness  Both legs weak Nerve pain in left leg  Neurological:  Positive for dizziness, tremors, weakness, numbness and headaches.  Psychiatric/Behavioral:  Positive for confusion.        Depression, dizzy      Objective:   Physical Exam General: No acute distress HEENT: NCAT, EOMI, oral membranes moist Cards: reg rate  Chest: normal effort Abdomen: Soft, NT, ND Skin: dry, intact Extremities: no edema Psych: Affect is very flat and at times anxious Neuro: farily alert and oriented x 3. fair insight and awareness. STM deficits and obvious difficulties with concentration and focus.. Normal language and speech. Cranial nerve exam unremarkable. UE nearly 4/5. LE: 3 to 4/5 distally.  Left lower still remains weaker than right. Inconsistent sensory exam still in both lower extremities. Musculoskeletal: Left shoulder remains a bit tender with internal and external rotation but she was able to tolerate those movements as well as flexion extension much more easily today.  There is less pain over the anterior shoulder.           Assessment & Plan:  1.  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             -continue outpt therapies,[facility] PT, OT, SLP             -begin trial of ritalin for concentration, 5mg  bid             -discussed with pt/daughter that she needs to continue acclimate but that her husband cant force her into situations beyond the point where she is no longer able to tolerate.   -may need to decrease topamax due to potential mental fog  -I am pleased that her mobility is improving and that she has continued to work hard in outpatient therapies 2.  Left shoulder pain, bicipital  tendonitis. : improved             - ice TID.              -outpt OT                          -  topical modalities for myalgias, Robaxin  as needed                            3.  Seizure prophylaxis: keppra 500mg  bid---  4.  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 Migraine/vascular headaches---have improved in general             -topamax 100mg  bid--stay with this for now             -Maxalt 5 mg 1 to 2 tablets daily as needed for migraine headaches                          -these help             -vision/anxiety component             -relaxation/rest when needed 6. Anxiety and depression  -stimulant as above to improve focus  -lexapro 5mg  qhs  -regular sleep  -family/social supports  -neuropsych referral at some point   30 minutes of face to face patient care time were spent during this visit. All questions were encouraged and answered.  Follow up with me in 2 mos .

## 2021-05-05 NOTE — Patient Instructions (Signed)
PLEASE FEEL FREE TO CALL OUR OFFICE WITH ANY PROBLEMS OR QUESTIONS ([phone])      

## 2021-05-06 ENCOUNTER — Ambulatory Visit: Payer: No Typology Code available for payment source | Admitting: Occupational Therapy

## 2021-05-06 ENCOUNTER — Ambulatory Visit: Payer: No Typology Code available for payment source | Attending: Family Medicine

## 2021-05-06 ENCOUNTER — Encounter: Payer: Self-pay | Admitting: Occupational Therapy

## 2021-05-06 ENCOUNTER — Ambulatory Visit: Payer: No Typology Code available for payment source

## 2021-05-06 DIAGNOSIS — M6281 Muscle weakness (generalized): Secondary | ICD-10-CM | POA: Insufficient documentation

## 2021-05-06 DIAGNOSIS — R2689 Other abnormalities of gait and mobility: Secondary | ICD-10-CM | POA: Insufficient documentation

## 2021-05-06 DIAGNOSIS — M25612 Stiffness of left shoulder, not elsewhere classified: Secondary | ICD-10-CM | POA: Insufficient documentation

## 2021-05-06 DIAGNOSIS — R279 Unspecified lack of coordination: Secondary | ICD-10-CM | POA: Diagnosis present

## 2021-05-06 DIAGNOSIS — R41840 Attention and concentration deficit: Secondary | ICD-10-CM | POA: Diagnosis present

## 2021-05-06 DIAGNOSIS — I69054 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: Secondary | ICD-10-CM | POA: Diagnosis present

## 2021-05-06 DIAGNOSIS — R41844 Frontal lobe and executive function deficit: Secondary | ICD-10-CM

## 2021-05-06 DIAGNOSIS — R2681 Unsteadiness on feet: Secondary | ICD-10-CM | POA: Insufficient documentation

## 2021-05-06 DIAGNOSIS — R41841 Cognitive communication deficit: Secondary | ICD-10-CM

## 2021-05-06 DIAGNOSIS — M25512 Pain in left shoulder: Secondary | ICD-10-CM

## 2021-05-06 NOTE — Therapy (Signed)
[facility] [address] [place], [place], [address] Phone: [phone]   Fax:  [phone]  Occupational Therapy Treatment/Discharge  Patient Details  Name: Amy Barry MRN: [id number] Date of Birth: 1970/06/23 Referring Provider (OT): Oval Linsey, MD   Encounter Date: 05/06/2021   OT End of Session - 05/06/21 0934     Visit Number 15    Number of Visits 17    Date for OT Re-Evaluation 05/12/21    Authorization Type [employer]    Authorization Time Period 120 visits PT/OT, 120 visits ST    Authorization - Number of Visits 59    OT Start Time 0932    OT Stop Time 1015    OT Time Calculation (min) 43 min    Activity Tolerance Patient tolerated treatment well    Behavior During Therapy [facility] for tasks assessed/performed             OCCUPATIONAL THERAPY DISCHARGE SUMMARY  Visits from Start of Care: 15  Current functional level related to goals / functional outcomes: Pt progress with LUE range of motion, decreased pain and LUE grip strength and coordination with time with occupational therapy.    Remaining deficits: Pt continues to demonstrate some compensations with LUE movement patterns   Education / Equipment: HEPs, education on movement patterns   Patient agrees to discharge. Patient goals were met. Patient is being discharged due to meeting the stated rehab goals..     Past Medical History:  Diagnosis Date   Anxiety    Arthralgia    Generalized weakness    bilateral upper and lower extremities,  walks with cane   GERD (gastroesophageal reflux disease)    Headache(784.0)    Heart murmur    per pt "since childhood, no problems"   History of kidney stones    Hypertension    followed pcp   IBS (irritable bowel syndrome)    Iron deficiency anemia    12-06-2017 per pt recently had IV iron infusion July 2019   Low vitamin D level    Osteoarthritis    pt unsure about this   PVC (premature ventricular  contraction)    per pt had holter monitor   SLE (systemic lupus erythematosus) ([place])    rheumotologist-- dr syed   Stroke [facility]) 06/2020   Wears glasses     Past Surgical History:  Procedure Laterality Date   CERVICAL DISC ARTHROPLASTY N/A 10/23/2015   Procedure: Cervical Disc Arthroplasty Cervical six-seven;  Surgeon: Eustace Moore, MD;  Location: [facility];  Service: Neurosurgery;  Laterality: N/A;   D & C HYSTEROSCOPY W/ RESECTION ENDOMETRIAL POLYP  10-13-2003    dr rivard '@[facility]'    ESOPHAGOGASTRODUODENOSCOPY     for GERD   IR ANGIO INTRA EXTRACRAN SEL COM CAROTID INNOMINATE BILAT MOD SED  07/06/2020   IR ANGIO INTRA EXTRACRAN SEL INTERNAL CAROTID BILAT MOD SED  06/29/2020   IR ANGIO VERTEBRAL SEL VERTEBRAL BILAT MOD SED  06/29/2020   IR ANGIO VERTEBRAL SEL VERTEBRAL BILAT MOD SED  07/06/2020   LAPAROSCOPIC ABDOMINAL EXPLORATION  1996   dx ibs   OVARIAN CYST SURGERY  age 71   x2 cyst   RADIOLOGY WITH ANESTHESIA N/A 06/29/2020   Procedure: IR WITH ANESTHESIA;  Surgeon: Radiologist, Medication, MD;  Location: [facility];  Service: Radiology;  Laterality: N/A;   ROBOTIC ASSISTED TOTAL HYSTERECTOMY N/A 12/13/2017   Procedure: XI ROBOTIC ASSISTED TOTAL HYSTERECTOMYWITH BILATERAL SALPINGECTOMY;  Surgeon: Christophe Louis, MD;  Location:  [facility];  Service: Gynecology;  Laterality: N/A;   TUBAL LIGATION Bilateral 06-01-2002   dr Mancel Bale '@[facility]'    PPTL    There were no vitals filed for this visit.   Subjective Assessment - 05/06/21 0933     Subjective  Pt denies any changes and reports no changes in pain - still about a 5    Pertinent History PMH: HTN, IBS, anemia, connective tissue disorder, family history of cerebral aneurysms    Limitations Fall Risk, L shoulder pain, L hemi    Currently in Pain? Yes    Pain Score 5     Pain Location Arm    Pain Orientation Left    Pain Descriptors / Indicators Aching    Pain Type Chronic pain    Pain Onset More than a month ago    Pain Frequency Constant               Medium Pegs with LUE with reaching to chest height with mod cues for movement pattern with shoulder. Pt removed pegs with LUE with in hand manipulation with mod cues for technique.                       OT Short Term Goals - 04/12/21 1840       OT SHORT TERM GOAL #1   Title Pt will be independent with initial HEP for LUE    Time 4    Period Weeks    Status Achieved      OT SHORT TERM GOAL #2   Title Pt will perform UB dressing with pain no greater than 7/10 in LUE shoulder and with no physical assistance    Time 4    Status Achieved      OT SHORT TERM GOAL #3   Title Pt will achieve 90 degrees of AROM shoulder flexion with LUE in order to progress to using LUE for functional reaching activities    Baseline 75* AROM - limited by pain    Time 4    Period Weeks    Status Achieved      OT SHORT TERM GOAL #4   Title Pt will increase grip strength in LUE by 5lbs or greater.    Baseline L 19.6, R 39.4,      1/3 - L 28.8!, R 52.6    Time 4    Period Weeks    Status Achieved      OT SHORT TERM GOAL #5   Title Pt will perform simple warm meal prep and/or light housekeeping with supervision and good safety awareness.    Baseline 1/3 reports helping daughter make a salad, and she reports doing laundry    Time 4    Period Weeks    Status Achieved               OT Long Term Goals - 05/06/21 8119       OT LONG TERM GOAL #1   Title Pt will be independent with updated HEP for LUE    Time 10    Period Weeks    Status Achieved    Target Date 05/12/21      OT LONG TERM GOAL #2   Title Pt will report pain no greater than 6/10 while completing HEP    Time 10    Period Weeks    Status Achieved      OT LONG TERM GOAL #3   Title Pt will increase fine motor  coordination in LUE by completing 9 hole peg test in 25 seconds or less.    Baseline R 21.53s, L 32.28s    Time 10    Period Weeks    Status Achieved      OT LONG TERM GOAL #4   Title Pt  will demonstrate ability to obtain a lightweight object at 110 degrees shoulder flexion or greater    Baseline 75    Time 10    Period Weeks    Status Achieved      OT LONG TERM GOAL #5   Title Pt will increase grip strength in LUE to 30 lbs or greater for increased functional use.    Time 10    Period Weeks    Status Achieved                   Plan - 05/06/21 1001     Clinical Impression Statement Pt has met all goals and is ready to discharge from OT at this time.    OT Occupational Profile and History Detailed Assessment- Review of Records and additional review of physical, cognitive, psychosocial history related to current functional performance    Occupational performance deficits (Please refer to evaluation for details): ADL's;IADL's    Body Structure / Function / Physical Skills ADL;Decreased knowledge of use of DME;Strength;Dexterity;GMC;Pain;UE functional use;IADL;ROM;Vision;Flexibility;Sensation;FMC;Coordination    Cognitive Skills Attention;Problem Solve;Understand;Emotional    Rehab Potential Good    Clinical Decision Making Several treatment options, min-mod task modification necessary    Comorbidities Affecting Occupational Performance: May have comorbidities impacting occupational performance    Modification or Assistance to Complete Evaluation  Min-Moderate modification of tasks or assist with assess necessary to complete eval    OT Frequency 2x / week    OT Duration Other (comment)   16 visits over 10 weeks for any scheduling conflicts   OT Treatment/Interventions Self-care/ADL training;Moist Heat;Fluidtherapy;DME and/or AE instruction;Splinting;Therapeutic activities;Traction;Aquatic Therapy;Ultrasound;Therapeutic exercise;Cognitive remediation/compensation;Visual/perceptual remediation/compensation;Passive range of motion;Functional Mobility Training;Neuromuscular education;Electrical Stimulation;Energy conservation;Manual Therapy;Patient/family education     Plan OT discharge    OT Home Exercise Plan aquatics for balance and shoulder pain, started with seated self PROM to floor - review at first session    Recommended Other Services seeing PT, ST    Consulted and Agree with Plan of Care Patient             Patient will benefit from skilled therapeutic intervention in order to improve the following deficits and impairments:   Body Structure / Function / Physical Skills: ADL, Decreased knowledge of use of DME, Strength, Dexterity, GMC, Pain, UE functional use, IADL, ROM, Vision, Flexibility, Sensation, FMC, Coordination Cognitive Skills: Attention, Problem Solve, Understand, Emotional     Visit Diagnosis: Lack of coordination  Stiffness of left shoulder, not elsewhere classified  Frontal lobe and executive function deficit  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) 03/04/2019   S/P hysterectomy 12/13/2017   Abnormal laboratory test  09/09/2016   Gait abnormality 08/03/2016   Weakness 08/03/2016   Paresthesia 08/03/2016   S/P cervical spinal fusion 10/23/2015   Hypersomnia 12/08/2010   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   EPIGASTRIC PAIN 10/13/2009   IRRITABLE BOWEL SYNDROME 03/10/2009   BACK PAIN, LUMBAR 07/01/2008   COSTOCHONDRITIS 02/01/2008   VIRAL URI 01/08/2008   CHEST PAIN 01/08/2008   OTHER SPECIFIED EPISODIC MOOD  DISORDER 12/25/2007   ARM PAIN 12/25/2007   ANEMIA-NOS 10/02/2007   Essential hypertension 10/02/2007   GERD 10/02/2007   POLYP, GALLBLADDER 10/02/2007   UTERINE POLYP 10/02/2007   OSTEOARTHRITIS 10/02/2007   Dyskinesia 10/02/2007   HEADACHE 10/02/2007   PALPITATIONS 10/02/2007   CARDIAC MURMUR, HX OF 10/02/2007   NEPHROLITHIASIS, HX OF 10/02/2007    Zachery Conch, OT 05/06/2021, 10:17 AM  [facility] [address] [place], [place], [address] Phone: [phone]   Fax:  [phone]  Name: JERUSALEM BROWNSTEIN MRN: [id number] Date of Birth: May 10, 1970

## 2021-05-06 NOTE — Therapy (Signed)
[facility] [address] [place], [place], [address] Phone: [phone]   Fax:  [phone]  Physical Therapy Treatment/Recert  Patient Details  Name: Amy Barry MRN: [id number] Date of Birth: [birth year] Referring Provider (PT): Dr. Naaman Plummer   Encounter Date: 05/06/2021   PT End of Session - 05/06/21 1024     Visit Number 7    Number of Visits 14    Date for PT Re-Evaluation 06/25/21    Authorization Type [employer]    PT Start Time 1018    PT Stop Time 1101    PT Time Calculation (min) 43 min    Equipment Utilized During Treatment Gait belt    Activity Tolerance Patient tolerated treatment well    Behavior During Therapy WFL for tasks assessed/performed             Past Medical History:  Diagnosis Date   Anxiety    Arthralgia    Generalized weakness    bilateral upper and lower extremities,  walks with cane   GERD (gastroesophageal reflux disease)    Headache(784.0)    Heart murmur    per pt "since childhood, no problems"   History of kidney stones    Hypertension    followed pcp   IBS (irritable bowel syndrome)    Iron deficiency anemia    12-06-2017 per pt recently had IV iron infusion July 2019   Low vitamin D level    Osteoarthritis    pt unsure about this   PVC (premature ventricular contraction)    per pt had holter monitor   SLE (systemic lupus erythematosus) ([place])    rheumotologist-- dr syed   Stroke [facility] 06/2020   Wears glasses     Past Surgical History:  Procedure Laterality Date   CERVICAL DISC ARTHROPLASTY N/A 10/23/2015   Procedure: Cervical Disc Arthroplasty Cervical six-seven;  Surgeon: Eustace Moore, MD;  Location: [place] [facility];  Service: Neurosurgery;  Laterality: N/A;   D & C HYSTEROSCOPY W/ RESECTION ENDOMETRIAL POLYP  10-13-2003    dr rivard '@[facility]'    ESOPHAGOGASTRODUODENOSCOPY     for GERD   IR ANGIO INTRA EXTRACRAN SEL COM CAROTID INNOMINATE BILAT MOD SED  07/06/2020   IR ANGIO  INTRA EXTRACRAN SEL INTERNAL CAROTID BILAT MOD SED  06/29/2020   IR ANGIO VERTEBRAL SEL VERTEBRAL BILAT MOD SED  06/29/2020   IR ANGIO VERTEBRAL SEL VERTEBRAL BILAT MOD SED  07/06/2020   LAPAROSCOPIC ABDOMINAL EXPLORATION  1996   dx ibs   OVARIAN CYST SURGERY  age 51   x2 cyst   RADIOLOGY WITH ANESTHESIA N/A 06/29/2020   Procedure: IR WITH ANESTHESIA;  Surgeon: Radiologist, Medication, MD;  Location: [place];  Service: Radiology;  Laterality: N/A;   ROBOTIC ASSISTED TOTAL HYSTERECTOMY N/A 12/13/2017   Procedure: XI ROBOTIC ASSISTED TOTAL HYSTERECTOMYWITH BILATERAL SALPINGECTOMY;  Surgeon: Christophe Louis, MD;  Location: [facility];  Service: Gynecology;  Laterality: N/A;   TUBAL LIGATION Bilateral 06-01-2002   dr Mancel Bale '@[facility]'    PPTL    There were no vitals filed for this visit.   Subjective Assessment - 05/06/21 1024     Subjective Pt reports that she has been sick with the flu. She feels deconditioned as did not do much due to this. Saw Dr. Naaman Plummer yesterday and he has prescribed Lexapro to help her sleep and Ritalin for energy. Pt reports that she has not been sleeping more than 3-4 hours at night so hoping this will help.  Patient is accompained by: Family member    Pertinent History medical history of generalized arthritis and myalgias and concern for functional weakness, SLE, irritable bowel, Subarachnoid hemorrhage    Limitations Standing;Walking;Writing;House hold activities;Reading;Lifting    How long can you sit comfortably? no issues    How long can you stand comfortably? 2 min    How long can you walk comfortably? 5 min    Patient Stated Goals Improve independence    Currently in Pain? Yes    Pain Score 4     Pain Location Arm    Pain Orientation Left    Pain Descriptors / Indicators Aching    Pain Type Chronic pain    Pain Onset More than a month ago    Pain Frequency Constant    Aggravating Factors  movement    Pain Relieving Factors rest    Pain Onset More than a month ago                 [facility] PT Assessment - 05/06/21 1026       Assessment   Medical Diagnosis Subarachnoid Hemorrhage [facility]), L Bicep Tendonitis    Referring Provider (PT) Dr. Naaman Plummer    Onset Date/Surgical Date 02/10/21      Transfers   Five time sit to stand comments  48.08 sec from chair with hands with [place] in front      Ambulation/Gait   Ambulation/Gait Assistance 5: Supervision    Assistive device Rolling Machamer    Gait Pattern Step-through pattern;Decreased step length - right;Decreased step length - left    Ambulation Surface Level;Indoor    Gait velocity 27.7 sec=0.33ms    Stairs Yes    Stairs Assistance 5: Supervision    Stair Management Technique One rail Right;One rail Left;Step to pattern    Number of Stairs 8    Height of Stairs 6      6 Minute Walk- Baseline   6 Minute Walk- Baseline yes    HR (bpm) 62    Modified Borg Scale for Dyspnea 6-   fatigue, not SOB     6 Minute walk- Post Test   6 Minute Walk Post Test yes    HR (bpm) 62    Modified Borg Scale for Dyspnea 6-      6 minute walk test results    Aerobic Endurance Distance Walked 240    Endurance additional comments with RW      Standardized Balance Assessment   Standardized Balance Assessment Timed Up and Go Test      Timed Up and Go Test   TUG Normal TUG    Normal TUG (seconds) 40.63   41.04 and 40.21 with RW                          [facility] Adult PT Treatment/Exercise - 05/06/21 1026       Transfers   Transfers Sit to Stand;Stand to Sit    Sit to Stand 5: Supervision;With upper extremity assist    Sit to Stand Details Verbal cues for technique    Stand to Sit With upper extremity assist      Ambulation/Gait   Ambulation/Gait Yes    Ambulation/Gait Assistance Details Pt was cued to relax shoulders and focus on foot placement    Ambulation Distance (Feet) 240 Feet   during 6 min walk     Neuro Re-ed    Neuro Re-ed Details  Pt stood at [place]  for 10 sec before losing balance  without UE support with increased extraneous movements                     PT Education - 05/06/21 1457     Education Details Results of testing and recert plan for 1x/week for 8 weeks. Discussed importance of pt trying to do more at home even if she has to change how she does things and try to get out of mindset that she can't do things.    Person(s) Educated Patient;Child(ren)    Methods Explanation    Comprehension Verbalized understanding              PT Short Term Goals - 04/13/21 1035       PT SHORT TERM GOAL #1   Title Patient will report at least 30% reduction in her daily headaches intensity and frequency to improve overall function    Baseline Constant headache that ranges from 4-9/10 daily. 03/31/21 Deferred as not treating for headaches as referral for brain bleed.    Time 6    Period Weeks    Status Deferred    Target Date 04/14/21      PT SHORT TERM GOAL #2   Title Patient will demo improve cervical extension by 5 deg to improve overall cervical ROM    Baseline 03/31/21 Deferred as not treating neck as referral for brain bleed    Time 6    Period Weeks    Status Deferred    Target Date 04/14/21      PT SHORT TERM GOAL #3   Title Patient will be able to ambulate 460' with RW with SBA without needing  aresting break to improve walking endurance in community    Baseline 115' with RW CGA. 04/13/21 460' supervision with RW    Time 6    Period Weeks    Status Achieved    Target Date 04/14/21      PT SHORT TERM GOAL #4   Title Patient will be able to perform sit to stand without use of UE to improve functional strength in LE    Baseline Definite need of R UE use with sit to stand transfer. 04/13/21 Able to perform without UE support from edge of mat but needs close SBA/CGA for balance.    Time 6    Period Weeks    Status Partially Met    Target Date 04/14/21               PT Long Term Goals - 05/06/21 1027       PT LONG TERM GOAL #1    Title Patient will demo 0.39ms improvement in her 10 meter walk gait speed with RW to progress to community ambulator    Baseline 0.41 m/s with RW, 05/06/21 0.366m    Time 8    Period Weeks    Status Not Met    Target Date 04/28/21      PT LONG TERM GOAL #2   Title Patient will report headache frequency of <2x/week to improve daily functioning    Baseline 05/06/21 Pt reports headaches had improved a lot until she got the flu recently. Reports she still has constant low lying headache    Time 8    Period Weeks    Status Not Met    Target Date 04/28/21      PT LONG TERM GOAL #3   Title Pt will demo TUG score in <20 seconds with  RW to improve functional mobility    Baseline 40.7 seconds with RW, 05/06/21 40.63 sec with RW    Time 8    Period Weeks    Status Not Met    Target Date 04/28/21      PT LONG TERM GOAL #4   Title Pt will be able to perform 5x sit to stand with or without UE support in <20 seconds to improve functional strength with transfers    Baseline 34 sec. 05/06/21 48.08 sec from chair with hands with [place] in front    Time 8    Period Weeks    Status New    Target Date 04/28/21             Updated goals:  PT Short Term Goals - 05/06/21 1507       PT SHORT TERM GOAL #1   Title Pt will be able to perform HEP for strengthening, balance and aerobic activity with family to continue gains at home.    Time 4    Period Weeks    Status New    Target Date 06/03/21      PT SHORT TERM GOAL #2   Title Pt will decrease 5 x sit to stand from 48.08 sec to <44 sec for improved balance and functional strength.    Baseline 05/06/21 48.08 sec from chair with hands    Time 4    Period Weeks    Status New    Target Date 06/03/21      PT SHORT TERM GOAL #3   Title Pt will increase gait speed from 0.90ms to >0.461m for improved household mobility.    Baseline 05/06/21 0.3649m   Time 4    Period Weeks    Status New    Target Date 06/03/21             PT Long Term  Goals - 05/06/21 1516       PT LONG TERM GOAL #1   Title Pt will decrease TUG from 40.63 sec to <36 sec for improved balance and functional mobility.(LTGs due 06/25/21)    Baseline 05/06/21 40.63 sec with RW    Time 8    Period Weeks    Status New    Target Date 06/25/21      PT LONG TERM GOAL #2   Title Pt will increase 6 min walk distance to >325' with RW and RPE <6/10 for improved activity tolerance.    Baseline 05/06/21 240' with 6/10 RPE    Time 8    Period Weeks    Status New    Target Date 06/25/21      PT LONG TERM GOAL #3   Title Pt will be able to maintain standing without UE support x 1 min for improved standing ADLs supervision.    Baseline 05/06/21 10 sec    Time 8    Period Weeks    Status New    Target Date 06/25/21      PT LONG TERM GOAL #4   Title Pt will ambulate up/down 8 steps with 1 railing mod I for improved home access with RPE <6/10    Baseline 05/06/21  currently supervision with 10/10 RPE after    Time 8    Period Weeks    Status New    Target Date 06/25/21                  Plan - 05/06/21 1459  Clinical Impression Statement Pt returned after 2 weeks out with having the flu. Checked LTGs and unable to meet them. Slight decline noted with gait speed of 0.29ms and 5x sit to stand of 48.08 sec both indicating decreased safety with functional mobility and fall risk. Pt's TUG score stayed about the same at 40.63 sec still indicating fall risk as well. Pt is currently ambulating with [place]. She was able to complete a 6 min walk covering 240' with RPE of 6/10. Pt reports that she fatigues very quickly with all activities especially since having the flu. Due to patient having limited visits so far and recent sickness PT will plan to recert to continue to work on strength, balance and functional mobility to maximize independence in home.    Personal Factors and Comorbidities Comorbidity 2;Past/Current Experience;Time since onset of  injury/illness/exacerbation    Comorbidities medical history of generalized arthritis and myalgias and concern for functional weakness, SLE, irritable bowel,    Examination-Activity Limitations Bathing;Caring for Others;Carry;Dressing;Hygiene/Grooming;Lift;Locomotion Level;Reach Overhead;Squat;Stairs;Stand;Transfers    Examination-Participation Restrictions Church;Cleaning;Community Activity;Driving;Laundry;Medication Management;Meal Prep;Occupation;Shop;Yard Work;Personal Finances    Stability/Clinical Decision Making Evolving/Moderate complexity    Rehab Potential Good    PT Frequency 1x / week    PT Duration 8 weeks    PT Treatment/Interventions ADLs/Self Care Home Management;Cryotherapy;Moist Heat;Traction;Gait training;Stair training;Functional mobility training;Therapeutic activities;Therapeutic exercise;Balance training;Manual techniques;Orthotic Fit/Training;Patient/family education;Cognitive remediation;Neuromuscular re-education;Passive range of motion;Energy conservation;Vestibular;Visual/perceptual remediation/compensation;Joint Manipulations;Spinal Manipulations    PT Next Visit Plan Big focus will be on trying to increase aerobic activity. Continue more functional activities. Continue NuStep for some aerobic activity and strengthening. HEP with family assist.  work on transfers with proper hand placement, standing balance activities trying to decrease UE support as able, gait training    Consulted and Agree with Plan of Care Patient;Family member/caregiver    Family Member Consulted daughter             Patient will benefit from skilled therapeutic intervention in order to improve the following deficits and impairments:  Abnormal gait, Decreased activity tolerance, Decreased balance, Decreased cognition, Decreased safety awareness, Decreased range of motion, Decreased mobility, Decreased endurance, Decreased strength, Difficulty walking, Dizziness, Impaired flexibility, Increased  fascial restricitons, Hypomobility, Impaired tone, Impaired UE functional use, Improper body mechanics, Postural dysfunction, Pain  Visit Diagnosis: Other abnormalities of gait and mobility  Muscle weakness (generalized)  Unsteadiness on feet     Problem List Patient Active Problem List   Diagnosis Date Noted   Generalized anxiety disorder 05/05/2021   Biceps tendonitis on left 02/10/2021   Vascular headache 08/05/2020   Hyponatremia 07/28/2020   Headache 07/24/2020   Neck pain, musculoskeletal 07/24/2020   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    [place]   [place] 06/29/2020   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[place] 03/04/2019   S/P hysterectomy 12/13/2017   Abnormal laboratory test 09/09/2016   Gait abnormality 08/03/2016   Weakness 08/03/2016   Paresthesia 08/03/2016   S/P cervical spinal fusion 10/23/2015   Hypersomnia 12/08/2010   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   EPIGASTRIC PAIN 10/13/2009   IRRITABLE BOWEL SYNDROME 03/10/2009   BACK PAIN, LUMBAR 07/01/2008   COSTOCHONDRITIS 02/01/2008   VIRAL URI 01/08/2008   CHEST PAIN 01/08/2008   OTHER SPECIFIED EPISODIC MOOD DISORDER 12/25/2007   ARM PAIN 12/25/2007   ANEMIA-NOS 10/02/2007   Essential hypertension 10/02/2007   GERD 10/02/2007   POLYP, GALLBLADDER 10/02/2007   UTERINE POLYP 10/02/2007   OSTEOARTHRITIS 10/02/2007   Dyskinesia 10/02/2007   HEADACHE  10/02/2007   PALPITATIONS 10/02/2007   CARDIAC MURMUR, HX OF 10/02/2007   NEPHROLITHIASIS, HX OF 10/02/2007    Electa Sniff, PT, DPT, NCS 05/06/2021, 3:07 PM  [facility] [address] [place], [place], [address] Phone: [phone]   Fax:  [phone]  Name: Amy Barry MRN: [id number] Date of Birth: April 20, 1970

## 2021-05-06 NOTE — Therapy (Signed)
[facility] [address] [place], [place], [address] Phone: [phone]   Fax:  [phone]  Speech Language Pathology Treatment/Recert  Patient Details  Name: Amy Barry MRN: [id number] Date of Birth: 11/24/1970 Referring Provider (SLP): Alger Simons MD   Encounter Date: 05/06/2021   End of Session - 05/06/21 1101     Visit Number 11    Number of Visits 19   recert for 1x/week for 8 weeks   Date for SLP Re-Evaluation 07/02/21    Authorization Type Aetna    SLP Start Time 1105    SLP Stop Time  0277    SLP Time Calculation (min) 40 min    Activity Tolerance Other (comment);Patient tolerated treatment well   initially overt-stimulated            Past Medical History:  Diagnosis Date   Anxiety    Arthralgia    Generalized weakness    bilateral upper and lower extremities,  walks with cane   GERD (gastroesophageal reflux disease)    Headache(784.0)    Heart murmur    per pt "since childhood, no problems"   History of kidney stones    Hypertension    followed pcp   IBS (irritable bowel syndrome)    Iron deficiency anemia    12-06-2017 per pt recently had IV iron infusion July 2019   Low vitamin D level    Osteoarthritis    pt unsure about this   PVC (premature ventricular contraction)    per pt had holter monitor   SLE (systemic lupus erythematosus) ([place])    rheumotologist-- dr syed   Stroke [facility]) 06/2020   Wears glasses     Past Surgical History:  Procedure Laterality Date   CERVICAL DISC ARTHROPLASTY N/A 10/23/2015   Procedure: Cervical Disc Arthroplasty Cervical six-seven;  Surgeon: Eustace Moore, MD;  Location: [facility];  Service: Neurosurgery;  Laterality: N/A;   D & C HYSTEROSCOPY W/ RESECTION ENDOMETRIAL POLYP  10-13-2003    dr rivard _0    ESOPHAGOGASTRODUODENOSCOPY     for GERD   IR ANGIO INTRA EXTRACRAN SEL COM CAROTID INNOMINATE BILAT MOD SED  07/06/2020   IR ANGIO INTRA  EXTRACRAN SEL INTERNAL CAROTID BILAT MOD SED  06/29/2020   IR ANGIO VERTEBRAL SEL VERTEBRAL BILAT MOD SED  06/29/2020   IR ANGIO VERTEBRAL SEL VERTEBRAL BILAT MOD SED  07/06/2020   LAPAROSCOPIC ABDOMINAL EXPLORATION  1996   dx ibs   OVARIAN CYST SURGERY  age 67   x2 cyst   RADIOLOGY WITH ANESTHESIA N/A 06/29/2020   Procedure: IR WITH ANESTHESIA;  Surgeon: Radiologist, Medication, MD;  Location: [facility];  Service: Radiology;  Laterality: N/A;   ROBOTIC ASSISTED TOTAL HYSTERECTOMY N/A 12/13/2017   Procedure: XI ROBOTIC ASSISTED TOTAL HYSTERECTOMYWITH BILATERAL SALPINGECTOMY;  Surgeon: Christophe Louis, MD;  Location: [facility];  Service: Gynecology;  Laterality: N/A;   TUBAL LIGATION Bilateral 06-01-2002   dr Mancel Bale _1    PPTL    There were no vitals filed for this visit.   Subjective Assessment - 05/06/21 1108     Subjective "it got really really loud out there and she got over stimulated" pt unable to verbalize upon entrance    Patient is accompained by: Family member   daughter   Currently in Pain? Yes    Pain Score 4     Pain Location Arm  ADULT SLP TREATMENT - 05/06/21 1101       General Information   Behavior/Cognition Alert;Lethargic;Distractible;Requires cueing;Pleasant mood      Treatment Provided   Treatment provided Cognitive-Linquistic      Cognitive-Linquistic Treatment   Treatment focused on Cognition;Patient/family/caregiver education    Skilled Treatment Pt having episode upon entrance to ST session as pt became over-stimulated while in gym. Pt became dizzy and overwhelmed. Gait shuffling exhibited upon entrance to SLP room. Pt unable to verbalize or participate for >10 minutes, which resulted in pt becoming upset and overtly emotional. During this time, SLP discussed and educated daughter on clinical observations and recommendations. Pt was prescribed Ritalin and Lexapro yesterday and will plan to begin tomorrow, which SLP suspects will be helpful for  management of behaviors exhibited thus far. After pt recovered from episode, pt able to verbally engage in discussion with pt able to recall details of MD appointment. Rephrasing and repetition from daughter reportedly aid pt comprehension and retention. Pt endorsed family will continue to resume management of bills/medications but she would like SLP assistance to better recall smaller tasks such as when cued to write shopping list, checking status of bill, or recall television episodes.      Assessment / Recommendations / Plan   Plan Continue with current plan of care;Goals updated      Progression Toward Goals   Progression toward goals Progressing toward goals   goals modified for more functional, obtainable personal goals             SLP Education - 05/06/21 1157     Education Details new goals, recert POC    Person(s) Educated Patient;Child(ren)    Methods Explanation;Demonstration;Verbal cues    Comprehension Verbalized understanding;Returned demonstration;Need further instruction              SLP Short Term Goals - 04/20/21 1146       SLP SHORT TERM GOAL #1   Title Complete CLQT to determine extent of cognitive deficits.    Status Achieved      SLP SHORT TERM GOAL #2   Title Pt will manage her calendar and appointments with rare min A from family over 2 weeks    Status Not Met      SLP SHORT TERM GOAL #3   Title Pt will sort pills into organizer independently with supervision cues from family    Status Not Met      SLP SHORT TERM GOAL #4   Title Pt will pay 3 bills with occasional min A from family    Status Not Met      SLP SHORT TERM GOAL #5   Title Pt will carryover 3 compensatory strategies to complete 2 IADL's with occasional min A from family    Status Partially Met              SLP Long Term Goals - 05/06/21 1148       SLP LONG TERM GOAL #1   Title Pt will independently manage her daily schedule, appointments, to do lists over 1 week     Status Not Met      SLP LONG TERM GOAL #2   Title Pt will use compensations for organization and attention to pay 5 bills with rare min A from family    Status Not Met      SLP LONG TERM GOAL #3   Title Pt will be independent in medication management with no A from family    Status Not  Met      SLP LONG TERM GOAL #4   Title Pt will improve score on NeuroQOL Cognitive Function PROM (initial score 39) by 4 points    Time 8    Period Weeks    Status On-going   ongoing for recert     SLP LONG TERM GOAL #5   Title Pt will carryover compensatory strategies for alternating attention in cooking and IADL's with rare min A from family    Status Not Met      Additional Long Term Goals   Additional Long Term Goals Yes      SLP LONG TERM GOAL #6   Title Pt will verbalize and implement 3 functional compensations to aid recall and attention for completion of small functional tasks at home given occasional min A over 2 sessions    Time 8    Period Weeks    Status New      SLP LONG TERM GOAL #7   Title Pt will verbally discuss movies/television shows/MD appointments with external compensatory aids to aid recall and verbal expression given occasional min A over 2 sessions    Time 8    Period Weeks    Status New              Plan - 05/06/21 1147     Clinical Impression Statement MD added new prescriptions for attention and anxiety, in which SLP hopes will address significant s/sx exhibited in ST sessions thus far. Given cognitive communication deficits persist and pt expressed desire to incorporate new compensatory strategies to reduce frustration and alleviate caregiver burden, POC recert completed for 1x/week for 8 weeks (scheduled 4 weeks as trial period given pt questioned motivation) to address cognitive communication skills impacting daily functioning. Continue skilled ST to maxmize safety, independence, and return to PLOF.    Speech Therapy Frequency 1x /week    Duration 8 weeks     Treatment/Interventions Compensatory strategies;Cueing hierarchy;Functional tasks;Patient/family education;Diet toleration management by SLP;Environmental controls;Cognitive reorganization;Multimodal communcation approach;Compensatory techniques;Internal/external aids;SLP instruction and feedback    Potential to Achieve Goals Fair    Potential Considerations Ability to learn/carryover information;Cooperation/participation level;Severity of impairments             Patient will benefit from skilled therapeutic intervention in order to improve the following deficits and impairments:   Cognitive communication deficit    Problem List Patient Active Problem List   Diagnosis Date Noted   Generalized anxiety disorder 05/05/2021   Biceps tendonitis on left 02/10/2021   Vascular headache 08/05/2020   Hyponatremia 07/28/2020   Headache 07/24/2020   Neck pain, musculoskeletal 07/24/2020   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    Subarachnoid bleed ([place]) 07/09/2020   Subarachnoid hemorrhage ([place]) 06/29/2020   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[facility]) 03/04/2019   S/P hysterectomy 12/13/2017   Abnormal laboratory test 09/09/2016   Gait abnormality 08/03/2016   Weakness 08/03/2016   Paresthesia 08/03/2016   S/P cervical spinal fusion 10/23/2015   Hypersomnia 12/08/2010   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   EPIGASTRIC PAIN 10/13/2009   IRRITABLE BOWEL SYNDROME 03/10/2009   BACK PAIN, LUMBAR 07/01/2008   COSTOCHONDRITIS 02/01/2008   VIRAL URI 01/08/2008   CHEST PAIN 01/08/2008   OTHER SPECIFIED EPISODIC MOOD DISORDER 12/25/2007   ARM PAIN 12/25/2007   ANEMIA-NOS 10/02/2007   Essential hypertension 10/02/2007   GERD 10/02/2007   POLYP, GALLBLADDER 10/02/2007   UTERINE POLYP 10/02/2007   OSTEOARTHRITIS 10/02/2007   Dyskinesia 10/02/2007   HEADACHE 10/02/2007  PALPITATIONS 10/02/2007   CARDIAC MURMUR, HX OF 10/02/2007   NEPHROLITHIASIS, HX OF 10/02/2007    Alinda Deem,  CCC-SLP 05/06/2021, 12:06 PM  [facility] [address] [place], [place], [address] Phone: [phone]   Fax:  [phone]   Name: ELIZETTE SHEK MRN: [id number] Date of Birth: Jun 18, 1970

## 2021-05-24 ENCOUNTER — Encounter: Payer: Self-pay | Admitting: Physical Therapy

## 2021-05-24 ENCOUNTER — Ambulatory Visit: Payer: No Typology Code available for payment source | Admitting: Physical Therapy

## 2021-05-24 ENCOUNTER — Other Ambulatory Visit: Payer: Self-pay

## 2021-05-24 ENCOUNTER — Ambulatory Visit: Payer: No Typology Code available for payment source

## 2021-05-24 VITALS — BP 158/72 | HR 74

## 2021-05-24 DIAGNOSIS — M6281 Muscle weakness (generalized): Secondary | ICD-10-CM

## 2021-05-24 DIAGNOSIS — R2689 Other abnormalities of gait and mobility: Secondary | ICD-10-CM

## 2021-05-24 DIAGNOSIS — R2681 Unsteadiness on feet: Secondary | ICD-10-CM

## 2021-05-24 DIAGNOSIS — R41841 Cognitive communication deficit: Secondary | ICD-10-CM

## 2021-05-24 NOTE — Patient Instructions (Addendum)
Bring memory book from your mother-in-law to next therapy session.  Limit yourself to one task/one show at a time. Don't try to multitask.   Take notes while you are watching television.  -Make sure you put the date, episode number, and short recap.    Consider how to modify or simplify what you are doing in order to make yourself successful.  -Plan out what you need before you start.   Be aware of possible triggers for episodes. Ex: getting frustrated if you can't find something or say a certain word. Try to recognize the signs and take a break as needed.  -You may need to ask family to identify what signs you are exhibiting when an episode is about to occur.

## 2021-05-24 NOTE — Therapy (Signed)
[facility] [address] [place], [place], [address] Phone: [phone]   Fax:  [phone]  Physical Therapy Treatment  Patient Details  Name: Amy Barry MRN: [id number] Date of Birth: 1970-06-12 Referring Provider (PT): Dr. Naaman [facility]   Encounter Date: 05/24/2021   PT End of Session - 05/24/21 1449     Visit Number 8    Number of Visits 14    Date for PT Re-Evaluation 06/25/21    Authorization Type Aetna    PT Start Time 1447    PT Stop Time 1530    PT Time Calculation (min) 43 min    Equipment Utilized During Treatment Gait belt    Activity Tolerance Patient tolerated treatment well    Behavior During Therapy WFL for tasks assessed/performed             Past Medical History:  Diagnosis Date   Anxiety    Arthralgia    Generalized weakness    bilateral upper and lower extremities,  walks with cane   GERD (gastroesophageal reflux disease)    Headache(784.0)    Heart murmur    per pt "since childhood, no problems"   History of kidney stones    Hypertension    followed pcp   IBS (irritable bowel syndrome)    Iron deficiency anemia    12-06-2017 per pt recently had IV iron infusion July 2019   Low vitamin D level    Osteoarthritis    pt unsure about this   PVC (premature ventricular contraction)    per pt had holter monitor   SLE (systemic lupus erythematosus) ([place])    rheumotologist-- dr syed   Stroke [facility]) 06/2020   Wears glasses     Past Surgical History:  Procedure Laterality Date   CERVICAL DISC ARTHROPLASTY N/A 10/23/2015   Procedure: Cervical Disc Arthroplasty Cervical six-seven;  Surgeon: Eustace Moore, MD;  Location: [facility];  Service: Neurosurgery;  Laterality: N/A;   D & C HYSTEROSCOPY W/ RESECTION ENDOMETRIAL POLYP  10-13-2003    dr rivard @[facility]    ESOPHAGOGASTRODUODENOSCOPY     for GERD   IR ANGIO INTRA EXTRACRAN SEL COM CAROTID INNOMINATE BILAT MOD SED  07/06/2020   IR ANGIO INTRA  EXTRACRAN SEL INTERNAL CAROTID BILAT MOD SED  06/29/2020   IR ANGIO VERTEBRAL SEL VERTEBRAL BILAT MOD SED  06/29/2020   IR ANGIO VERTEBRAL SEL VERTEBRAL BILAT MOD SED  07/06/2020   LAPAROSCOPIC ABDOMINAL EXPLORATION  1996   dx ibs   OVARIAN CYST SURGERY  age 57   x2 cyst   RADIOLOGY WITH ANESTHESIA N/A 06/29/2020   Procedure: IR WITH ANESTHESIA;  Surgeon: Radiologist, Medication, MD;  Location: [facility];  Service: Radiology;  Laterality: N/A;   ROBOTIC ASSISTED TOTAL HYSTERECTOMY N/A 12/13/2017   Procedure: XI ROBOTIC ASSISTED TOTAL HYSTERECTOMYWITH BILATERAL SALPINGECTOMY;  Surgeon: Christophe Louis, MD;  Location: [facility];  Service: Gynecology;  Laterality: N/A;   TUBAL LIGATION Bilateral 06-01-2002   dr Mancel Bale @[facility]    PPTL    Vitals:   05/24/21 1505  BP: (!) 158/72  Pulse: 74  SpO2: 99%     Subjective Assessment - 05/24/21 1450     Subjective Pt reports that she has been having bouts of over stimulation with crowds, loud music, and talking.  Her MD put her on Ritalin and Lexapro 2 weeks ago and she notices her anxiety is lessened and her focus and memory are better.  Head is not hurting as much.  Patient is accompained by: Family member    Pertinent History medical history of generalized arthritis and myalgias and concern for functional weakness, SLE, irritable bowel, Subarachnoid hemorrhage    Limitations Standing;Walking;Writing;House hold activities;Reading;Lifting    How long can you sit comfortably? no issues    How long can you stand comfortably? 2 min    How long can you walk comfortably? 5 min    Patient Stated Goals Improve independence    Currently in Pain? No/denies   States she usually has LUE pain.   Pain Onset More than a month ago    Pain Onset More than a month ago                               [facility] Adult PT Treatment/Exercise - 05/24/21 1457       Ambulation/Gait   Ambulation/Gait Yes    Ambulation/Gait Assistance 5: Supervision    Ambulation/Gait  Assistance Details Pt was cued to reach left foot towards wheels of RW to improve clearance of other foot and step-through pattern, PT provided pelvic facilitation to promote pattern of pelvic rise during swing through (2 laps)    Ambulation Distance (Feet) 230 Feet    Assistive device Rolling Mellen    Gait Pattern Step-through pattern;Decreased step length - right;Decreased step length - left    Ambulation Surface Level;Indoor      Neuro Re-ed    Neuro Re-ed Details  Pt performs 2 bouts standing at [place] w/ bilateral UE strength ~20 sec>2 bouts LUE support 30 sec & RUE support on RW 30sec; pt with inc trunk and opposite UE movement of non-supporting limb requiring tactile cues to regain balance; advance retreat task to forward midline using alt LE w/ green dot for visual cue; pt cued for slow, controlled movement, pt demo inc internal rotation at hip during movement      Exercises   Other Exercises  NuStep level 3, 6 min to promote aerobic endurance and reciprocal LE engagement, avg steps per min 25, BP taken following NuStep, pt requiring rest break to recover.  RPE of 6/10                       PT Short Term Goals - 05/06/21 1507       PT SHORT TERM GOAL #1   Title Pt will be able to perform HEP for strengthening, balance and aerobic activity with family to continue gains at home.    Time 4    Period Weeks    Status New    Target Date 06/03/21      PT SHORT TERM GOAL #2   Title Pt will decrease 5 x sit to stand from 48.08 sec to <44 sec for improved balance and functional strength.    Baseline 05/06/21 48.08 sec from chair with hands    Time 4    Period Weeks    Status New    Target Date 06/03/21      PT SHORT TERM GOAL #3   Title Pt will increase gait speed from 0.41m/s to >0.25m/s for improved household mobility.    Baseline 05/06/21 0.60m/s    Time 4    Period Weeks    Status New    Target Date 06/03/21               PT Long Term Goals - 05/06/21 1516  PT LONG TERM GOAL #1   Title Pt will decrease TUG from 40.63 sec to <36 sec for improved balance and functional mobility.(LTGs due 06/25/21)    Baseline 05/06/21 40.63 sec with RW    Time 8    Period Weeks    Status New    Target Date 06/25/21      PT LONG TERM GOAL #2   Title Pt will increase 6 min walk distance to >325' with RW and RPE <6/10 for improved activity tolerance.    Baseline 05/06/21 240' with 6/10 RPE    Time 8    Period Weeks    Status New    Target Date 06/25/21      PT LONG TERM GOAL #3   Title Pt will be able to maintain standing without UE support x 1 min for improved standing ADLs supervision.    Baseline 05/06/21 10 sec    Time 8    Period Weeks    Status New    Target Date 06/25/21      PT LONG TERM GOAL #4   Title Pt will ambulate up/down 8 steps with 1 railing mod I for improved home access with RPE <6/10    Baseline 05/06/21  currently supervision with 10/10 RPE after    Time 8    Period Weeks    Status New    Target Date 06/25/21                   Plan - 05/24/21 1534     Clinical Impression Statement Pt continues to tolerate progression of endurance training and balance tasks with some continued difficulty with extraneous movements of the trunk and limbs with decreased UE support.  She demonstrates difficulty weight shifting onto LLE and unlocking knee during limb advancement requiring increased cuing during ambulation and functional upright tasks.    Personal Factors and Comorbidities Comorbidity 2;Past/Current Experience;Time since onset of injury/illness/exacerbation    Comorbidities medical history of generalized arthritis and myalgias and concern for functional weakness, SLE, irritable bowel,    Examination-Activity Limitations Bathing;Caring for Others;Carry;Dressing;Hygiene/Grooming;Lift;Locomotion Level;Reach Overhead;Squat;Stairs;Stand;Transfers    Examination-Participation Restrictions Church;Cleaning;Community  Activity;Driving;Laundry;Medication Management;Meal Prep;Occupation;Shop;Yard Work;Personal Finances    Stability/Clinical Decision Making Evolving/Moderate complexity    Rehab Potential Good    PT Frequency 1x / week    PT Duration 8 weeks    PT Treatment/Interventions ADLs/Self Care Home Management;Cryotherapy;Moist Heat;Traction;Gait training;Stair training;Functional mobility training;Therapeutic activities;Therapeutic exercise;Balance training;Manual techniques;Orthotic Fit/Training;Patient/family education;Cognitive remediation;Neuromuscular re-education;Passive range of motion;Energy conservation;Vestibular;Visual/perceptual remediation/compensation;Joint Manipulations;Spinal Manipulations    PT Next Visit Plan Reassess STGs.  Follow-up about sleep and return to work?  Big focus will be on trying to increase aerobic activity. Continue more functional activities. Continue NuStep for some aerobic activity and strengthening. HEP with family assist.  work on transfers with proper hand placement, standing balance activities trying to decrease UE support as able, gait training    Consulted and Agree with Plan of Care Patient;Family member/caregiver    Family Member Consulted daughter             Patient will benefit from skilled therapeutic intervention in order to improve the following deficits and impairments:  Abnormal gait, Decreased activity tolerance, Decreased balance, Decreased cognition, Decreased safety awareness, Decreased range of motion, Decreased mobility, Decreased endurance, Decreased strength, Difficulty walking, Dizziness, Impaired flexibility, Increased fascial restricitons, Hypomobility, Impaired tone, Impaired UE functional use, Improper body mechanics, Postural dysfunction, Pain  Visit Diagnosis: Other abnormalities of gait and mobility  Muscle weakness (generalized)  Unsteadiness on feet     Problem List Patient Active Problem List   Diagnosis Date Noted    Generalized anxiety disorder 05/05/2021   Biceps tendonitis on left 02/10/2021   Vascular headache 08/05/2020   Hyponatremia 07/28/2020   Headache 07/24/2020   Neck pain, musculoskeletal 07/24/2020   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    Subarachnoid bleed ([place]) 07/09/2020   Subarachnoid hemorrhage ([place]) 06/29/2020   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[place]) 03/04/2019   S/P hysterectomy 12/13/2017   Abnormal laboratory test 09/09/2016   Gait abnormality 08/03/2016   Weakness 08/03/2016   Paresthesia 08/03/2016   S/P cervical spinal fusion 10/23/2015   Hypersomnia 12/08/2010   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   EPIGASTRIC PAIN 10/13/2009   IRRITABLE BOWEL SYNDROME 03/10/2009   BACK PAIN, LUMBAR 07/01/2008   COSTOCHONDRITIS 02/01/2008   VIRAL URI 01/08/2008   CHEST PAIN 01/08/2008   OTHER SPECIFIED EPISODIC MOOD DISORDER 12/25/2007   ARM PAIN 12/25/2007   ANEMIA-NOS 10/02/2007   Essential hypertension 10/02/2007   GERD 10/02/2007   POLYP, GALLBLADDER 10/02/2007   UTERINE POLYP 10/02/2007   OSTEOARTHRITIS 10/02/2007   Dyskinesia 10/02/2007   HEADACHE 10/02/2007   PALPITATIONS 10/02/2007   CARDIAC MURMUR, HX OF 10/02/2007   NEPHROLITHIASIS, HX OF 10/02/2007   Rico Junker, PT, DPT 05/24/21    5:14 PM    [facility] [address] [place], [place], [address] Phone: [phone]   Fax:  [phone]  Name: KEONDRA HAYDU MRN: [id number] Date of Birth: 1970-06-23

## 2021-05-24 NOTE — Therapy (Signed)
[facility] [address] [place], [place], [address] Phone: [phone]   Fax:  [phone]  Speech Language Pathology Treatment  Patient Details  Name: Amy Barry MRN: [id number] Date of Birth: [birth year] Referring Provider (SLP): Alger Simons MD   Encounter Date: 05/24/2021   End of Session - 05/24/21 1404     Visit Number 12    Number of Visits 19    Date for SLP Re-Evaluation 07/02/21    Authorization Type [employer]    SLP Start Time 1404    SLP Stop Time  9417    SLP Time Calculation (min) 41 min    Activity Tolerance Patient tolerated treatment well             Past Medical History:  Diagnosis Date   Anxiety    Arthralgia    Generalized weakness    bilateral upper and lower extremities,  walks with cane   GERD (gastroesophageal reflux disease)    Headache(784.0)    Heart murmur    per pt "since childhood, no problems"   History of kidney stones    Hypertension    followed pcp   IBS (irritable bowel syndrome)    Iron deficiency anemia    12-06-2017 per pt recently had IV iron infusion July 2019   Low vitamin D level    Osteoarthritis    pt unsure about this   PVC (premature ventricular contraction)    per pt had holter monitor   SLE (systemic lupus erythematosus) ([place])    rheumotologist-- dr syed   Stroke [facility]) 06/2020   Wears glasses     Past Surgical History:  Procedure Laterality Date   CERVICAL DISC ARTHROPLASTY N/A 10/23/2015   Procedure: Cervical Disc Arthroplasty Cervical six-seven;  Surgeon: Eustace Moore, MD;  Location: [facility];  Service: Neurosurgery;  Laterality: N/A;   D & C HYSTEROSCOPY W/ RESECTION ENDOMETRIAL POLYP  10-13-2003    dr rivard '@[facility]'    ESOPHAGOGASTRODUODENOSCOPY     for GERD   IR ANGIO INTRA EXTRACRAN SEL COM CAROTID INNOMINATE BILAT MOD SED  07/06/2020   IR ANGIO INTRA EXTRACRAN SEL INTERNAL CAROTID BILAT MOD SED  06/29/2020   IR ANGIO VERTEBRAL SEL  VERTEBRAL BILAT MOD SED  06/29/2020   IR ANGIO VERTEBRAL SEL VERTEBRAL BILAT MOD SED  07/06/2020   LAPAROSCOPIC ABDOMINAL EXPLORATION  1996   dx ibs   OVARIAN CYST SURGERY  age 41   x2 cyst   RADIOLOGY WITH ANESTHESIA N/A 06/29/2020   Procedure: IR WITH ANESTHESIA;  Surgeon: Radiologist, Medication, MD;  Location: [facility];  Service: Radiology;  Laterality: N/A;   ROBOTIC ASSISTED TOTAL HYSTERECTOMY N/A 12/13/2017   Procedure: XI ROBOTIC ASSISTED TOTAL HYSTERECTOMYWITH BILATERAL SALPINGECTOMY;  Surgeon: Christophe Louis, MD;  Location: [facility];  Service: Gynecology;  Laterality: N/A;   TUBAL LIGATION Bilateral 06-01-2002   dr Mancel Bale '@[facility]    PPTL    There were no vitals filed for this visit.   Subjective Assessment - 05/24/21 1404     Subjective "just trying to get used to the side effects"    Patient is accompained by: Family member   husband   Currently in Pain? Yes    Pain Score 2     Pain Location Head   and arm                  ADULT SLP TREATMENT - 05/24/21 1404       General Information  Behavior/Cognition Alert;Distractible;Requires cueing;Pleasant mood;Cooperative      Treatment Provided   Treatment provided Cognitive-Linquistic      Cognitive-Linquistic Treatment   Treatment focused on Cognition;Patient/family/caregiver education    Skilled Treatment Pt has continued two newer prescriptions for attention and anxiety, in which some positive outcomes reported ("think better"). SLP educated and instructed memory and attention strategies to utilize at home to optimize recall and completion of necessary tasks. Pt verbalized understanding and agreement to trial. Handout provided with recommendations.      Assessment / Recommendations / Plan   Plan Continue with current plan of care      Progression Toward Goals   Progression toward goals Progressing toward goals              SLP Education - 05/24/21 1440     Education Details recommendations, HEP    Person(s)  Educated Patient;Child(ren)    Methods Explanation;Demonstration;Handout;Verbal cues    Comprehension Verbalized understanding;Returned demonstration;Need further instruction              SLP Short Term Goals - 04/20/21 1146       SLP SHORT TERM GOAL #1   Title Complete CLQT to determine extent of cognitive deficits.    Status Achieved      SLP SHORT TERM GOAL #2   Title Pt will manage her calendar and appointments with rare min A from family over 2 weeks    Status Not Met      SLP SHORT TERM GOAL #3   Title Pt will sort pills into organizer independently with supervision cues from family    Status Not Met      SLP SHORT TERM GOAL #4   Title Pt will pay 3 bills with occasional min A from family    Status Not Met      SLP SHORT TERM GOAL #5   Title Pt will carryover 3 compensatory strategies to complete 2 IADL's with occasional min A from family    Status Partially Met              SLP Long Term Goals - 05/24/21 1557       SLP LONG TERM GOAL #1   Title Pt will independently manage her daily schedule, appointments, to do lists over 1 week    Status Not Met      SLP LONG TERM GOAL #2   Title Pt will use compensations for organization and attention to pay 5 bills with rare min A from family    Status Not Met      SLP LONG TERM GOAL #3   Title Pt will be independent in medication management with no A from family    Status Not Met      SLP LONG TERM GOAL #4   Title Pt will improve score on NeuroQOL Cognitive Function PROM (initial score 39) by 4 points    Time 8    Period Weeks    Status On-going   ongoing for recert     SLP LONG TERM GOAL #5   Title Pt will carryover compensatory strategies for alternating attention in cooking and IADL's with rare min A from family    Status Not Met      SLP LONG TERM GOAL #6   Title Pt will verbalize and implement 3 functional compensations to aid recall and attention for completion of small functional tasks at home given  occasional min A over 2 sessions    Time 8  Period Weeks    Status On-going      SLP LONG TERM GOAL #7   Title Pt will verbally discuss movies/television shows/MD appointments with external compensatory aids to aid recall and verbal expression given occasional min A over 2 sessions    Time 8    Period Weeks    Status On-going              Plan - 05/24/21 1556     Clinical Impression Statement Pt has continued two new prescriptions for attention and anxiety, in which some mild positive outcomes reported. SLP educated and instructed recommended compensatory strateiges to optimize recall and attention for household related tasks to improve functional independence. Pt verbalized agreement to trial and implement at home. Continue skilled ST to maxmize safety, independence, and return to PLOF.    Speech Therapy Frequency 1x /week    Duration 8 weeks    Treatment/Interventions Compensatory strategies;Cueing hierarchy;Functional tasks;Patient/family education;Diet toleration management by SLP;Environmental controls;Cognitive reorganization;Multimodal communcation approach;Compensatory techniques;Internal/external aids;SLP instruction and feedback    Potential to Achieve Goals Fair    Potential Considerations Ability to learn/carryover information;Cooperation/participation level;Severity of impairments             Patient will benefit from skilled therapeutic intervention in order to improve the following deficits and impairments:   Cognitive communication deficit    Problem List Patient Active Problem List   Diagnosis Date Noted   Generalized anxiety disorder 05/05/2021   Biceps tendonitis on left 02/10/2021   Vascular headache 08/05/2020   Hyponatremia 07/28/2020   Headache 07/24/2020   Neck pain, musculoskeletal 07/24/2020   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    Subarachnoid bleed ([place]) 07/09/2020   Subarachnoid hemorrhage ([place]) 06/29/2020    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[place]) 03/04/2019   S/P hysterectomy 12/13/2017   Abnormal laboratory test 09/09/2016   Gait abnormality 08/03/2016   Weakness 08/03/2016   Paresthesia 08/03/2016   S/P cervical spinal fusion 10/23/2015   Hypersomnia 12/08/2010   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   EPIGASTRIC PAIN 10/13/2009   IRRITABLE BOWEL SYNDROME 03/10/2009   BACK PAIN, LUMBAR 07/01/2008   COSTOCHONDRITIS 02/01/2008   VIRAL URI 01/08/2008   CHEST PAIN 01/08/2008   OTHER SPECIFIED EPISODIC MOOD DISORDER 12/25/2007   ARM PAIN 12/25/2007   ANEMIA-NOS 10/02/2007   Essential hypertension 10/02/2007   GERD 10/02/2007   POLYP, GALLBLADDER 10/02/2007   UTERINE POLYP 10/02/2007   OSTEOARTHRITIS 10/02/2007   Dyskinesia 10/02/2007   HEADACHE 10/02/2007   PALPITATIONS 10/02/2007   CARDIAC MURMUR, HX OF 10/02/2007   NEPHROLITHIASIS, HX OF 10/02/2007    Marzetta Board, CCC-SLP 05/24/2021, 3:58 PM  [facility] [address] [place], [place], [address] Phone: [phone]   Fax:  [phone]   Name: Amy Barry MRN: [id number] Date of Birth: Oct 08, 1970

## 2021-05-28 ENCOUNTER — Other Ambulatory Visit: Payer: Self-pay | Admitting: Physical Medicine & Rehabilitation

## 2021-05-28 DIAGNOSIS — F411 Generalized anxiety disorder: Secondary | ICD-10-CM

## 2021-06-03 ENCOUNTER — Other Ambulatory Visit: Payer: Self-pay

## 2021-06-03 ENCOUNTER — Ambulatory Visit: Payer: No Typology Code available for payment source | Attending: Family Medicine

## 2021-06-03 ENCOUNTER — Ambulatory Visit: Payer: No Typology Code available for payment source | Admitting: Physical Therapy

## 2021-06-03 ENCOUNTER — Telehealth: Payer: Self-pay | Admitting: *Deleted

## 2021-06-03 DIAGNOSIS — R4189 Other symptoms and signs involving cognitive functions and awareness: Secondary | ICD-10-CM

## 2021-06-03 DIAGNOSIS — R2681 Unsteadiness on feet: Secondary | ICD-10-CM

## 2021-06-03 DIAGNOSIS — M6281 Muscle weakness (generalized): Secondary | ICD-10-CM

## 2021-06-03 DIAGNOSIS — R2689 Other abnormalities of gait and mobility: Secondary | ICD-10-CM | POA: Insufficient documentation

## 2021-06-03 DIAGNOSIS — R41841 Cognitive communication deficit: Secondary | ICD-10-CM | POA: Insufficient documentation

## 2021-06-03 DIAGNOSIS — R279 Unspecified lack of coordination: Secondary | ICD-10-CM | POA: Insufficient documentation

## 2021-06-03 MED ORDER — METHYLPHENIDATE HCL 5 MG PO TABS: 5.0000 mg | ORAL_TABLET | Freq: Two times a day (BID) | ORAL | 0 refills | Status: DC

## 2021-06-03 NOTE — Telephone Encounter (Signed)
Rx sent 

## 2021-06-03 NOTE — Patient Instructions (Addendum)
Recapping the episode to family is a great way to help cement into your memory. Rephrasing in your own words and the repetition is helpful for memory. You may need to rewatch or rewind the episode if you feel you missed important details.  ? ?Write down the names of shows you have watched recently and what they were about.  ? -[employer]  ? ? ? ? ?Continue to be aware of your "episode" symptoms: tearing up, speech stops, really tired ? ? ?You are doing a great job Floriene! Keep up with memory journal: write down daily events and items on your to-do lists  ?

## 2021-06-03 NOTE — Therapy (Signed)
[facility] [address] [place], [place], [address] Phone: [phone]   Fax:  [phone]  Speech Language Pathology Treatment  Patient Details  Name: Amy Barry MRN: [id number] Date of Birth: 09-12-1970 Referring Provider (SLP): Alger Simons MD   Encounter Date: 06/03/2021   End of Session - 06/03/21 1401     Visit Number 13    Number of Visits 19    Date for SLP Re-Evaluation 07/02/21    Authorization Type Aet[employer]    SLP Start Time 8889    SLP Stop Time  1694    SLP Time Calculation (min) 40 min    Activity Tolerance Patient tolerated treatment well             Past Medical History:  Diagnosis Date   Anxiety    Arthralgia    Generalized weakness    bilateral upper and lower extremities,  walks with cane   GERD (gastroesophageal reflux disease)    Headache(784.0)    Heart murmur    per pt "since childhood, no problems"   History of kidney stones    Hypertension    followed pcp   IBS (irritable bowel syndrome)    Iron deficiency anemia    12-06-2017 per pt recently had IV iron infusion July 2019   Low vitamin D level    Osteoarthritis    pt unsure about this   PVC (premature ventricular contraction)    per pt had holter monitor   SLE (systemic lupus erythematosus) ([place])    rheumotologist-- dr syed   Stroke [facility]) 06/2020   Wears glasses     Past Surgical History:  Procedure Laterality Date   CERVICAL DISC ARTHROPLASTY N/A 10/23/2015   Procedure: Cervical Disc Arthroplasty Cervical six-seven;  Surgeon: Eustace Moore, MD;  Location: [facility];  Service: Neurosurgery;  Laterality: N/A;   D & C HYSTEROSCOPY W/ RESECTION ENDOMETRIAL POLYP  10-13-2003    dr rivard '@[facility]'    ESOPHAGOGASTRODUODENOSCOPY     for GERD   IR ANGIO INTRA EXTRACRAN SEL COM CAROTID INNOMINATE BILAT MOD SED  07/06/2020   IR ANGIO INTRA EXTRACRAN SEL INTERNAL CAROTID BILAT MOD SED  06/29/2020   IR ANGIO VERTEBRAL SEL VERTEBRAL  BILAT MOD SED  06/29/2020   IR ANGIO VERTEBRAL SEL VERTEBRAL BILAT MOD SED  07/06/2020   LAPAROSCOPIC ABDOMINAL EXPLORATION  1996   dx ibs   OVARIAN CYST SURGERY  age 89   x2 cyst   RADIOLOGY WITH ANESTHESIA N/A 06/29/2020   Procedure: IR WITH ANESTHESIA;  Surgeon: Radiologist, Medication, MD;  Location: [facility];  Service: Radiology;  Laterality: N/A;   ROBOTIC ASSISTED TOTAL HYSTERECTOMY N/A 12/13/2017   Procedure: XI ROBOTIC ASSISTED TOTAL HYSTERECTOMYWITH BILATERAL SALPINGECTOMY;  Surgeon: Christophe Louis, MD;  [facility];  Service: Gynecology;  Laterality: N/A;   TUBAL LIGATION Bilateral 06-01-2002   dr Mancel Bale '@[facility]'    PPTL    There were no vitals filed for this visit.   Subjective Assessment - 06/03/21 1406     Subjective "I did the steps (in PT)"    Patient is accompained by: Family member   husband   Currently in Pain? No/denies                   ADULT SLP TREATMENT - 06/03/21 1401       General Information   Behavior/Cognition Alert;Distractible;Requires cueing;Pleasant mood;Cooperative      Treatment Provided   Treatment provided Cognitive-Linquistic  Cognitive-Linquistic Treatment   Treatment focused on Cognition;Patient/family/caregiver education    Skilled Treatment Pt endorsed no longer feeling jittery and ability to think better. Improved sustained attention reported while watching movies and increased awareness of "episode" behaviors indicated, in which pt able to appropriately request daughter's help. Pt has been tracking appointments via written calendar as well as using memory journal, with list of 3 tasks written down for week. Pt able to appropriately update to-do list with completed and new tasks with rare prompting. SLP encouraged continued use of memory journal. Pt unable to recall recent television shows watched but pt provided seemingly thorough recap of new episode watched today. Pt stated "it feels like my brain is on fire" after providing verbal  description. SLP provided positive reinforcment for good recall and prompted cognitive break. SLP provided handout with recommendations to aid carryover.      Assessment / Recommendations / Plan   Plan Continue with current plan of care      Progression Toward Goals   Progression toward goals Progressing toward goals              SLP Education - 06/03/21 1459     Education Details HEP, recommendations    Person(s) Educated Patient;Spouse    Methods Explanation;Demonstration;Verbal cues;Handout    Comprehension Verbalized understanding;Returned demonstration;Need further instruction              SLP Short Term Goals - 04/20/21 1146       SLP SHORT TERM GOAL #1   Title Complete CLQT to determine extent of cognitive deficits.    Status Achieved      SLP SHORT TERM GOAL #2   Title Pt will manage her calendar and appointments with rare min A from family over 2 weeks    Status Not Met      SLP SHORT TERM GOAL #3   Title Pt will sort pills into organizer independently with supervision cues from family    Status Not Met      SLP SHORT TERM GOAL #4   Title Pt will pay 3 bills with occasional min A from family    Status Not Met      SLP SHORT TERM GOAL #5   Title Pt will carryover 3 compensatory strategies to complete 2 IADL's with occasional min A from family    Status Partially Met              SLP Long Term Goals - 06/03/21 1402       SLP LONG TERM GOAL #1   Title Pt will independently manage her daily schedule, appointments, to do lists over 1 week    Status Not Met      SLP LONG TERM GOAL #2   Title Pt will use compensations for organization and attention to pay 5 bills with rare min A from family    Status Not Met      SLP LONG TERM GOAL #3   Title Pt will be independent in medication management with no A from family    Status Not Met      SLP LONG TERM GOAL #4   Title Pt will improve score on NeuroQOL Cognitive Function PROM (initial score 39) by 4  points    Time 7    Period Weeks    Status On-going      SLP LONG TERM GOAL #5   Title Pt will carryover compensatory strategies for alternating attention in cooking and IADL's with rare min  A from family    Status Not Met      SLP LONG TERM GOAL #6   Title Pt will verbalize and implement 3 functional compensations to aid recall and attention for completion of small functional tasks at home given occasional min A over 2 sessions    Time 7    Period Weeks    Status On-going      SLP LONG TERM GOAL #7   Title Pt will verbally discuss movies/television shows/MD appointments with external compensatory aids to aid recall and verbal expression given occasional min A over 2 sessions    Baseline 06-03-21    Time 7    Period Weeks    Status On-going              Plan - 06/03/21 1402     Clinical Impression Statement Pt has continued two new prescriptions for attention and anxiety, in which some positive outcomes reported. SLP educated and instructed recommended compensatory strateiges to optimize recall and attention for household related tasks to improve functional independence. Improved carryover of compensations demonstrated this session, which has been helpful for patient. Continue skilled ST to maxmize safety, independence, and return to PLOF.    Speech Therapy Frequency 1x /week    Duration 8 weeks    Treatment/Interventions Compensatory strategies;Cueing hierarchy;Functional tasks;Patient/family education;Diet toleration management by SLP;Environmental controls;Cognitive reorganization;Multimodal communcation approach;Compensatory techniques;Internal/external aids;SLP instruction and feedback    Potential to Achieve Goals Fair    Potential Considerations Ability to learn/carryover information;Cooperation/participation level;Severity of impairments             Patient will benefit from skilled therapeutic intervention in order to improve the following deficits and impairments:    Cognitive communication deficit    Problem List Patient Active Problem List   Diagnosis Date Noted   Generalized anxiety disorder 05/05/2021   Biceps tendonitis on left 02/10/2021   Vascular headache 08/05/2020   Hyponatremia 07/28/2020   Headache 07/24/2020   Neck pain, musculoskeletal 07/24/2020   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    Subarachnoid bleed ([place]) 07/09/2020   Subarachnoid hemorrhage ([place]) 06/29/2020   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[place]) 03/04/2019   S/P hysterectomy 12/13/2017   Abnormal laboratory test 09/09/2016   Gait abnormality 08/03/2016   Weakness 08/03/2016   Paresthesia 08/03/2016   S/P cervical spinal fusion 10/23/2015   Hypersomnia 12/08/2010   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   EPIGASTRIC PAIN 10/13/2009   IRRITABLE BOWEL SYNDROME 03/10/2009   BACK PAIN, LUMBAR 07/01/2008   COSTOCHONDRITIS 02/01/2008   VIRAL URI 01/08/2008   CHEST PAIN 01/08/2008   OTHER SPECIFIED EPISODIC MOOD DISORDER 12/25/2007   ARM PAIN 12/25/2007   ANEMIA-NOS 10/02/2007   Essential hypertension 10/02/2007   GERD 10/02/2007   POLYP, GALLBLADDER 10/02/2007   UTERINE POLYP 10/02/2007   OSTEOARTHRITIS 10/02/2007   Dyskinesia 10/02/2007   HEADACHE 10/02/2007   PALPITATIONS 10/02/2007   CARDIAC MURMUR, HX OF 10/02/2007   NEPHROLITHIASIS, HX OF 10/02/2007    Marzetta Board, CCC-SLP 06/03/2021, 3:01 PM  [facility] [address] [place], [place], [address] Phone: [phone]   Fax:  [phone]   Name: Amy Barry MRN: [id number] Date of Birth: 1970-07-25

## 2021-06-03 NOTE — Telephone Encounter (Signed)
Patient called stating [facility] called her and said they are out of stock of Ritalin and [facility] has it in stock. Need prescription sent to [facility]. I called [facility] and cancelled other prescription. ?

## 2021-06-03 NOTE — Therapy (Signed)
[facility] [address] [place], [place], [address] Phone: [phone]   Fax:  [phone]  Physical Therapy Treatment  Patient Details  Name: Amy Barry MRN: [id number] Date of Birth: 12/15/1970 Referring Provider (PT): Dr. Naaman Plummer   Encounter Date: 06/03/2021   PT End of Session - 06/03/21 1407     Visit Number 9    Number of Visits 14    Date for PT Re-Evaluation 06/25/21    Authorization Type [employer]    PT Start Time 6962    PT Stop Time 1401    PT Time Calculation (min) 38 min    Activity Tolerance Patient tolerated treatment well    Behavior During Therapy [facility] for tasks assessed/performed             Past Medical History:  Diagnosis Date   Anxiety    Arthralgia    Generalized weakness    bilateral upper and lower extremities,  walks with cane   GERD (gastroesophageal reflux disease)    Headache(784.0)    Heart murmur    per pt "since childhood, no problems"   History of kidney stones    Hypertension    followed pcp   IBS (irritable bowel syndrome)    Iron deficiency anemia    12-06-2017 per pt recently had IV iron infusion July 2019   Low vitamin D level    Osteoarthritis    pt unsure about this   PVC (premature ventricular contraction)    per pt had holter monitor   SLE (systemic lupus erythematosus) ([place])    rheumotologist-- dr syed   Stroke [facility]) 06/2020   Wears glasses     Past Surgical History:  Procedure Laterality Date   CERVICAL DISC ARTHROPLASTY N/A 10/23/2015   Procedure: Cervical Disc Arthroplasty Cervical six-seven;  Surgeon: Eustace Moore, MD;  Location: [facility];  Service: Neurosurgery;  Laterality: N/A;   D & C HYSTEROSCOPY W/ RESECTION ENDOMETRIAL POLYP  10-13-2003    dr rivard _0    ESOPHAGOGASTRODUODENOSCOPY     for GERD   IR ANGIO INTRA EXTRACRAN SEL COM CAROTID INNOMINATE BILAT MOD SED  07/06/2020   IR ANGIO INTRA EXTRACRAN SEL INTERNAL CAROTID BILAT MOD SED   06/29/2020   IR ANGIO VERTEBRAL SEL VERTEBRAL BILAT MOD SED  06/29/2020   IR ANGIO VERTEBRAL SEL VERTEBRAL BILAT MOD SED  07/06/2020   LAPAROSCOPIC ABDOMINAL EXPLORATION  1996   dx ibs   OVARIAN CYST SURGERY  age 51   x2 cyst   RADIOLOGY WITH ANESTHESIA N/A 06/29/2020   Procedure: IR WITH ANESTHESIA;  Surgeon: Radiologist, Medication, MD;  Location: [facility];  Service: Radiology;  Laterality: N/A;   ROBOTIC ASSISTED TOTAL HYSTERECTOMY N/A 12/13/2017   Procedure: XI ROBOTIC ASSISTED TOTAL HYSTERECTOMYWITH BILATERAL SALPINGECTOMY;  Surgeon: Christophe Louis, MD;  Location: [facility];  Service: Gynecology;  Laterality: N/A;   TUBAL LIGATION Bilateral 06-01-2002   dr Mancel Bale _1    PPTL    There were no vitals filed for this visit.   Subjective Assessment - 06/03/21 1324     Subjective Not having too much pain today.  Had to bring RW from upstairs; her usual [place] is in her daughter's car.    Patient is accompained by: Family member    Pertinent History medical history of generalized arthritis and myalgias and concern for functional weakness, SLE, irritable bowel, Subarachnoid hemorrhage    Limitations Standing;Walking;Writing;House hold activities;Reading;Lifting    How long can you sit comfortably?  no issues    How long can you stand comfortably? 2 min    How long can you walk comfortably? 5 min    Patient Stated Goals Improve independence    Currently in Pain? No/denies    Pain Onset More than a month ago    Pain Onset More than a month ago                [facility] PT Assessment - 06/03/21 1328       Ambulation/Gait   Stairs Yes    Stairs Assistance 5: Supervision;4: Min guard    Stairs Assistance Details (indicate cue type and reason) supervision with both hands holding one rail on R and then L ascending and descending laterally (how pt performs at home).  Then performed with one UE support on R and then L rail with min guard when ascending and descending forwards.  Intermittent cues to step  out further to avoid hitting heel on step.      Standardized Balance Assessment   Standardized Balance Assessment Timed Up and Go Test;Five Times Sit to Stand;10 meter walk test    Five times sit to stand comments  40 seconds from mat with UE pushing up from mat.  Min A required today due to posterior LOB and keeping weight back on heels.    10 Meter Walk 23 seconds or .37msec with RW and supervision      Timed Up and Go Test   TUG Normal TUG    Normal TUG (seconds) 39.81    TUG Comments with RW and close supervision                PT Education - 06/03/21 1407     Education Details progress towards STG; areas to continue to focus on    Person(s) Educated Patient    Methods Explanation    Comprehension Verbalized understanding              PT Short Term Goals - 06/03/21 1359       PT SHORT TERM GOAL #1   Title Pt will be able to perform HEP for strengthening, balance and aerobic activity with family to continue gains at home.    Baseline Not able to do all exercises each day due to fatigue    Time 4    Period Weeks    Status Partially Met    Target Date 06/03/21      PT SHORT TERM GOAL #2   Title Pt will decrease 5 x sit to stand from 48.08 sec to <44 sec for improved balance and functional strength.    Baseline 05/06/21 48.08 sec from chair with hands > 40 seconds    Time 4    Period Weeks    Status Achieved    Target Date 06/03/21      PT SHORT TERM GOAL #3   Title Pt will increase gait speed from 0.345m to >0.468mfor improved household mobility.    Baseline 05/06/21 0.9m68m .43 m/sec    Time 4    Period Weeks    Status Partially Met    Target Date 06/03/21               PT Long Term Goals - 06/03/21 1406       PT LONG TERM GOAL #1   Title Pt will decrease TUG from 40.63 sec to <36 sec for improved balance and functional mobility.(LTGs due 06/25/21)    Baseline 05/06/21 40.63  sec with RW > 39 seconds with RW on 06/03/21    Time 8    Period Weeks     Status On-going    Target Date 06/25/21      PT LONG TERM GOAL #2   Title Pt will increase 6 min walk distance to >325' with RW and RPE <6/10 for improved activity tolerance.    Baseline 05/06/21 240' with 6/10 RPE    Time 8    Period Weeks    Status New    Target Date 06/25/21      PT LONG TERM GOAL #3   Title Pt will be able to maintain standing without UE support x 1 min for improved standing ADLs supervision.    Baseline 05/06/21 10 sec    Time 8    Period Weeks    Status New    Target Date 06/25/21      PT LONG TERM GOAL #4   Title Pt will ambulate up/down 8 steps with 1 railing mod I for improved home access with RPE <6/10    Baseline 16 stairs with one UE support on rail and min A    Time 8    Period Weeks    Status On-going    Target Date 06/25/21                   Plan - 06/03/21 1408     Clinical Impression Statement Pt is making steady progress and hast met 1/3 STG, partially meeting other two goals.  Pt is not able to consistently perform all exercises in HEP due to ongoing fatigue.  Pt did meet five time sit to stand goal but continues to require increased UE support and increased time due to poor anterior weight shift of COG over BOS during sit > stand.  Pt is making progress with gait velocity but was still slightly below goal level.  Pt is also making progress towards TUG and stair LTG; still utilizing bilat UE on one rail when ascending and descending stairs - will continue to focus on progressing towards one UE support.    Personal Factors and Comorbidities Comorbidity 2;Past/Current Experience;Time since onset of injury/illness/exacerbation    Comorbidities medical history of generalized arthritis and myalgias and concern for functional weakness, SLE, irritable bowel,    Examination-Activity Limitations Bathing;Caring for Others;Carry;Dressing;Hygiene/Grooming;Lift;Locomotion Level;Reach Overhead;Squat;Stairs;Stand;Transfers    Examination-Participation  Restrictions Church;Cleaning;Community Activity;Driving;Laundry;Medication Management;Meal Prep;Occupation;Shop;Yard Work;Personal Finances    Stability/Clinical Decision Making Evolving/Moderate complexity    Rehab Potential Good    PT Frequency 1x / week    PT Duration 8 weeks    PT Treatment/Interventions ADLs/Self Care Home Management;Cryotherapy;Moist Heat;Traction;Gait training;Stair training;Functional mobility training;Therapeutic activities;Therapeutic exercise;Balance training;Manual techniques;Orthotic Fit/Training;Patient/family education;Cognitive remediation;Neuromuscular re-education;Passive range of motion;Energy conservation;Vestibular;Visual/perceptual remediation/compensation;Joint Manipulations;Spinal Manipulations    PT Next Visit Plan Stairs with one UE support.  Weight shifting forwards during sit > stand. Follow-up about sleep and return to work?  Big focus will be on trying to increase aerobic activity. Continue more functional activities. Continue NuStep for some aerobic activity and strengthening. HEP with family assist.  work on transfers with proper hand placement, standing balance activities trying to decrease UE support as able, gait training    Consulted and Agree with Plan of Care Patient;Family member/caregiver    Family Member Consulted husband             Patient will benefit from skilled therapeutic intervention in order to improve the following deficits and impairments:  Abnormal gait, Decreased  activity tolerance, Decreased balance, Decreased cognition, Decreased safety awareness, Decreased range of motion, Decreased mobility, Decreased endurance, Decreased strength, Difficulty walking, Dizziness, Impaired flexibility, Increased fascial restricitons, Hypomobility, Impaired tone, Impaired UE functional use, Improper body mechanics, Postural dysfunction, Pain  Visit Diagnosis: Other abnormalities of gait and mobility  Muscle weakness  (generalized)  Unsteadiness on feet     Problem List Patient Active Problem List   Diagnosis Date Noted   Generalized anxiety disorder 05/05/2021   Biceps tendonitis on left 02/10/2021   Vascular headache 08/05/2020   Hyponatremia 07/28/2020   Headache 07/24/2020   Neck pain, musculoskeletal 07/24/2020   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    Subarachnoid bleed ([place]) 07/09/2020   Subarachnoid hemorrhage ([place]) 06/29/2020   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[place]) 03/04/2019   S/P hysterectomy 12/13/2017   Abnormal laboratory test 09/09/2016   Gait abnormality 08/03/2016   Weakness 08/03/2016   Paresthesia 08/03/2016   S/P cervical spinal fusion 10/23/2015   Hypersomnia 12/08/2010   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   EPIGASTRIC PAIN 10/13/2009   IRRITABLE BOWEL SYNDROME 03/10/2009   BACK PAIN, LUMBAR 07/01/2008   COSTOCHONDRITIS 02/01/2008   VIRAL URI 01/08/2008   CHEST PAIN 01/08/2008   OTHER SPECIFIED EPISODIC MOOD DISORDER 12/25/2007   ARM PAIN 12/25/2007   ANEMIA-NOS 10/02/2007   Essential hypertension 10/02/2007   GERD 10/02/2007   POLYP, GALLBLADDER 10/02/2007   UTERINE POLYP 10/02/2007   OSTEOARTHRITIS 10/02/2007   Dyskinesia 10/02/2007   HEADACHE 10/02/2007   PALPITATIONS 10/02/2007   CARDIAC MURMUR, HX OF 10/02/2007   NEPHROLITHIASIS, HX OF 10/02/2007    Rico Junker, PT, DPT 06/03/21    2:13 PM    [facility] [address] [place], [place], [address] Phone: [phone]   Fax:  [phone]  Name: LANISSA CASHEN MRN: [id number] Date of Birth: 1970-08-07

## 2021-06-03 NOTE — Telephone Encounter (Signed)
Ritalin refilled

## 2021-06-03 NOTE — Telephone Encounter (Signed)
Notified. 

## 2021-06-03 NOTE — Telephone Encounter (Signed)
Amy Barry is requesting a refill on her methylphenidate. Last fill date was 05/05/21. Next appt is 08/18/21. ?

## 2021-06-03 NOTE — Addendum Note (Signed)
Addended by: Alger Simons T on: 06/03/2021 04:55 PM ? ? Modules accepted: Orders ? ?

## 2021-06-04 ENCOUNTER — Other Ambulatory Visit: Payer: Self-pay | Admitting: Physical Medicine & Rehabilitation

## 2021-06-09 ENCOUNTER — Encounter: Payer: Self-pay | Admitting: Physical Therapy

## 2021-06-09 ENCOUNTER — Ambulatory Visit: Payer: No Typology Code available for payment source | Admitting: Physical Therapy

## 2021-06-09 ENCOUNTER — Encounter: Payer: Self-pay | Admitting: Speech Pathology

## 2021-06-09 ENCOUNTER — Other Ambulatory Visit: Payer: Self-pay

## 2021-06-09 ENCOUNTER — Ambulatory Visit: Payer: No Typology Code available for payment source | Admitting: Speech Pathology

## 2021-06-09 VITALS — BP 123/69 | HR 62

## 2021-06-09 DIAGNOSIS — M6281 Muscle weakness (generalized): Secondary | ICD-10-CM

## 2021-06-09 DIAGNOSIS — R2689 Other abnormalities of gait and mobility: Secondary | ICD-10-CM

## 2021-06-09 DIAGNOSIS — R41841 Cognitive communication deficit: Secondary | ICD-10-CM

## 2021-06-09 NOTE — Therapy (Signed)
[facility] [address] [place], [place], [address] Phone: [phone]   Fax:  [phone]  Physical Therapy Treatment  Patient Details  Name: Amy Barry MRN: [id number] Date of Birth: [birth year] Referring Provider (PT): Dr. Naaman Plummer   Encounter Date: 06/09/2021   PT End of Session - 06/09/21 1428     Visit Number 10    Number of Visits 14    Date for PT Re-Evaluation 06/25/21    Authorization Type Aetna    PT Start Time 1325    PT Stop Time 1403    PT Time Calculation (min) 38 min    Equipment Utilized During Treatment Gait belt    Activity Tolerance Patient tolerated treatment well    Behavior During Therapy WFL for tasks assessed/performed             Past Medical History:  Diagnosis Date   Anxiety    Arthralgia    Generalized weakness    bilateral upper and lower extremities,  walks with cane   GERD (gastroesophageal reflux disease)    Headache(784.0)    Heart murmur    per pt "since childhood, no problems"   History of kidney stones    Hypertension    followed pcp   IBS (irritable bowel syndrome)    Iron deficiency anemia    12-06-2017 per pt recently had IV iron infusion July 2019   Low vitamin D level    Osteoarthritis    pt unsure about this   PVC (premature ventricular contraction)    per pt had holter monitor   SLE (systemic lupus erythematosus) ([place])    rheumotologist-- dr syed   Stroke [facility]) 06/2020   Wears glasses     Past Surgical History:  Procedure Laterality Date   CERVICAL DISC ARTHROPLASTY N/A 10/23/2015   Procedure: Cervical Disc Arthroplasty Cervical six-seven;  Surgeon: Eustace Moore, MD;  Location: [facility];  Service: Neurosurgery;  Laterality: N/A;   D & C HYSTEROSCOPY W/ RESECTION ENDOMETRIAL POLYP  10-13-2003    dr rivard '@[facility]'    ESOPHAGOGASTRODUODENOSCOPY     for GERD   IR ANGIO INTRA EXTRACRAN SEL COM CAROTID INNOMINATE BILAT MOD SED  07/06/2020   IR ANGIO INTRA  EXTRACRAN SEL INTERNAL CAROTID BILAT MOD SED  06/29/2020   IR ANGIO VERTEBRAL SEL VERTEBRAL BILAT MOD SED  06/29/2020   IR ANGIO VERTEBRAL SEL VERTEBRAL BILAT MOD SED  07/06/2020   LAPAROSCOPIC ABDOMINAL EXPLORATION  1996   dx ibs   OVARIAN CYST SURGERY  age 51   x2 cyst   RADIOLOGY WITH ANESTHESIA N/A 06/29/2020   Procedure: IR WITH ANESTHESIA;  Surgeon: Radiologist, Medication, MD;  Location: [facility];  Service: Radiology;  Laterality: N/A;   ROBOTIC ASSISTED TOTAL HYSTERECTOMY N/A 12/13/2017   Procedure: XI ROBOTIC ASSISTED TOTAL HYSTERECTOMYWITH BILATERAL SALPINGECTOMY;  Surgeon: Christophe Louis, MD;  Location: [facility];  Service: Gynecology;  Laterality: N/A;   TUBAL LIGATION Bilateral 06-01-2002   dr Mancel Bale '@[facility]'    PPTL    Vitals:   06/09/21 1330  BP: 123/69  Pulse: 62     Subjective Assessment - 06/09/21 1331     Subjective Everything has been okay.  Headaches come and go.  She has been sleeping a whole better since being on lexapro.  Has not been cleared to return to work.  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  Yesterday she wanted to make an omelet and forgot how to do it.    Patient is accompained by: Family member    Pertinent History medical history of generalized arthritis and myalgias and concern for functional weakness, SLE, irritable bowel, Subarachnoid hemorrhage    Limitations Standing;Walking;Writing;House hold activities;Reading;Lifting    How long can you sit comfortably? no issues    How long can you stand comfortably? 2 min    How long can you walk comfortably? 5 min    Patient Stated Goals Improve independence    Currently in Pain? Yes    Pain Score 4     Pain Location Arm    Pain Orientation Left    Pain Descriptors / Indicators Aching    Pain Type Chronic pain    Pain Onset More than a month ago    Pain Frequency  Intermittent    Pain Onset More than a month ago                               [facility] Adult PT Treatment/Exercise - 06/09/21 1343       Ambulation/Gait   Ambulation/Gait Yes    Ambulation/Gait Assistance 4: Min guard    Ambulation/Gait Assistance Details Cued to aim feet to front [place] wheels to increase step size and widen BOS.  Pt requires 3 standing breaks of <15 sec during lap.  Following gait SpO2 99%, HR 72 bpm.    Ambulation Distance (Feet) 115 Feet    Assistive device Rolling Licht    Gait Pattern Step-through pattern;Trunk flexed;Narrow base of support;Poor foot clearance - left;Poor foot clearance - right   inc UE weight bearing   Ambulation Surface Level;Indoor    Stairs Yes    Stairs Assistance 4: Min guard    Stairs Assistance Details (indicate cue type and reason) Pt negotiates 4x6" steps using R rail during ascent, bilateral rails during descent.  Pt cued to weight shift prior to placing foot on step during descent to prevent sliding heel off step with poor foot placement, increased posterior lean with concentration on foot placement.  Pt begins with step-to pattern on first step up, cued for remaining 3 steps using step through with increased forward trunk lean.  Pt descends with step-to pattern using bilateral rails.  Attempted use of unilateral rail on R during ascent/descent with pt demonstrating increased difficulty initiating stepping.  Performed 4 bouts of step up and step down from 6" bottom step using R lateral rail.  Pt demonstrated improved mechanics.      Neuro Re-ed    Neuro Re-ed Details  Pt performs 2 bouts standing w/ UE support on RW, feet shoulder width apart, 1 min> hand over hand 1 min, no UE support 1 min.  Pt is guarded with UE flexed and adducted w/ inc trunkal ataxia during standing w/o UE support.  Progressed to wide semi-tandem w/ hand over hand support x1 min each foot leading, progressing to x30sec each side w/o UE support.   Increased trunk/hip extraneous movements during semi-tandem.  LOB to R posterolateral direction when R foot in front.  Pt able to step foot forward w/ hand over hand support w/ inc ataxia to position prior to holds.      Knee/Hip Exercises: Aerobic   Nustep x4 mins L2>x4 mins L3 using BLE/BUE; pt given  goal of 40 steps per min, achieved avg 39 steps per min.  PT provided tactile cues to promote separation of knees throughout motion.  99%, 61 bpm following.                       PT Short Term Goals - 06/03/21 1359       PT SHORT TERM GOAL #1   Title Pt will be able to perform HEP for strengthening, balance and aerobic activity with family to continue gains at home.    Baseline Not able to do all exercises each day due to fatigue    Time 4    Period Weeks    Status Partially Met    Target Date 06/03/21      PT SHORT TERM GOAL #2   Title Pt will decrease 5 x sit to stand from 48.08 sec to <44 sec for improved balance and functional strength.    Baseline 05/06/21 48.08 sec from chair with hands > 40 seconds    Time 4    Period Weeks    Status Achieved    Target Date 06/03/21      PT SHORT TERM GOAL #3   Title Pt will increase gait speed from 0.77ms to >0.43m for improved household mobility.    Baseline 05/06/21 0.3673m> .43 m/sec    Time 4    Period Weeks    Status Partially Met    Target Date 06/03/21               PT Long Term Goals - 06/03/21 1406       PT LONG TERM GOAL #1   Title Pt will decrease TUG from 40.63 sec to <36 sec for improved balance and functional mobility.(LTGs due 06/25/21)    Baseline 05/06/21 40.63 sec with RW > 39 seconds with RW on 06/03/21    Time 8    Period Weeks    Status On-going    Target Date 06/25/21      PT LONG TERM GOAL #2   Title Pt will increase 6 min walk distance to >325' with RW and RPE <6/10 for improved activity tolerance.    Baseline 05/06/21 240' with 6/10 RPE    Time 8    Period Weeks    Status New    Target  Date 06/25/21      PT LONG TERM GOAL #3   Title Pt will be able to maintain standing without UE support x 1 min for improved standing ADLs supervision.    Baseline 05/06/21 10 sec    Time 8    Period Weeks    Status New    Target Date 06/25/21      PT LONG TERM GOAL #4   Title Pt will ambulate up/down 8 steps with 1 railing mod I for improved home access with RPE <6/10    Baseline 16 stairs with one UE support on rail and min A    Time 8    Period Weeks    Status On-going    Target Date 06/25/21                   Plan - 06/09/21 1433     Clinical Impression Statement Pt received following speech therapy.  Pt remains limited by fatigue and continued difficulty locating COM over feet during static and dynamic standing activity.  Increased extraneous movements of trunk and at hip when increased concentration during gait and  stair training.  She was able to progress to ascending stairs w/ R rail, but exhibited increased difficulty continuing stepping with unilateral rail during descent so regressed to bilateral rail use to promote step through.  Pt continues to progress towards LTGs.    Personal Factors and Comorbidities Comorbidity 2;Past/Current Experience;Time since onset of injury/illness/exacerbation    Comorbidities medical history of generalized arthritis and myalgias and concern for functional weakness, SLE, irritable bowel,    Examination-Activity Limitations Bathing;Caring for Others;Carry;Dressing;Hygiene/Grooming;Lift;Locomotion Level;Reach Overhead;Squat;Stairs;Stand;Transfers    Examination-Participation Restrictions Church;Cleaning;Community Activity;Driving;Laundry;Medication Management;Meal Prep;Occupation;Shop;Yard Work;Personal Finances    Stability/Clinical Decision Making Evolving/Moderate complexity    Rehab Potential Good    PT Frequency 1x / week    PT Duration 8 weeks    PT Treatment/Interventions ADLs/Self Care Home Management;Cryotherapy;Moist  Heat;Traction;Gait training;Stair training;Functional mobility training;Therapeutic activities;Therapeutic exercise;Balance training;Manual techniques;Orthotic Fit/Training;Patient/family education;Cognitive remediation;Neuromuscular re-education;Passive range of motion;Energy conservation;Vestibular;Visual/perceptual remediation/compensation;Joint Manipulations;Spinal Manipulations    PT Next Visit Plan Stairs with one UE support.  Weight shifting forwards during sit > stand. Follow-up about sleep and return to work?  Big focus will be on trying to increase aerobic activity. Continue more functional activities. Continue NuStep for some aerobic activity and strengthening. HEP with family assist.  work on transfers with proper hand placement, standing balance activities trying to decrease UE support as able, gait training    Consulted and Agree with Plan of Care Patient;Family member/caregiver    Family Member Consulted husband             Patient will benefit from skilled therapeutic intervention in order to improve the following deficits and impairments:  Abnormal gait, Decreased activity tolerance, Decreased balance, Decreased cognition, Decreased safety awareness, Decreased range of motion, Decreased mobility, Decreased endurance, Decreased strength, Difficulty walking, Dizziness, Impaired flexibility, Increased fascial restricitons, Hypomobility, Impaired tone, Impaired UE functional use, Improper body mechanics, Postural dysfunction, Pain  Visit Diagnosis: Other abnormalities of gait and mobility  Muscle weakness (generalized)     Problem List Patient Active Problem List   Diagnosis Date Noted   Generalized anxiety disorder 05/05/2021   Biceps tendonitis on left 02/10/2021   Vascular headache 08/05/2020   Hyponatremia 07/28/2020   Headache 07/24/2020   Neck pain, musculoskeletal 07/24/2020   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    Subarachnoid bleed  ([place]) 07/09/2020   Subarachnoid hemorrhage ([place]) 06/29/2020   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[place]) 03/04/2019   S/P hysterectomy 12/13/2017   Abnormal laboratory test 09/09/2016   Gait abnormality 08/03/2016   Weakness 08/03/2016   Paresthesia 08/03/2016   S/P cervical spinal fusion 10/23/2015   Hypersomnia 12/08/2010   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   EPIGASTRIC PAIN 10/13/2009   IRRITABLE BOWEL SYNDROME 03/10/2009   BACK PAIN, LUMBAR 07/01/2008   COSTOCHONDRITIS 02/01/2008   VIRAL URI 01/08/2008   CHEST PAIN 01/08/2008   OTHER SPECIFIED EPISODIC MOOD DISORDER 12/25/2007   ARM PAIN 12/25/2007   ANEMIA-NOS 10/02/2007   Essential hypertension 10/02/2007   GERD 10/02/2007   POLYP, GALLBLADDER 10/02/2007   UTERINE POLYP 10/02/2007   OSTEOARTHRITIS 10/02/2007   Dyskinesia 10/02/2007   HEADACHE 10/02/2007   PALPITATIONS 10/02/2007   CARDIAC MURMUR, HX OF 10/02/2007   NEPHROLITHIASIS, HX OF 10/02/2007    Bary Richard, PT, DPT 06/09/2021, 2:41 PM  [facility] [address] [place], [place], [address] Phone: [phone]   Fax:  [phone]  Name: Amy Barry MRN: [id number] Date of Birth: 12-10-1970

## 2021-06-09 NOTE — Therapy (Signed)
[facility] [address] [place], [place], [address] Phone: [phone]   Fax:  [phone]  Speech Language Pathology Treatment  Patient Details  Name: Amy Barry MRN: [id number] Date of Birth: September 11, 1970 Referring Provider (SLP): Alger Simons MD   Encounter Date: 06/09/2021   End of Session - 06/09/21 1329     Visit Number 14    Number of Visits 19    Date for SLP Re-Evaluation 07/02/21    Authorization Type Aetna    SLP Start Time 1238   pt arrived late   SLP Stop Time  1315    SLP Time Calculation (min) 37 min    Activity Tolerance Patient tolerated treatment well             Past Medical History:  Diagnosis Date   Anxiety    Arthralgia    Generalized weakness    bilateral upper and lower extremities,  walks with cane   GERD (gastroesophageal reflux disease)    Headache(784.0)    Heart murmur    per pt "since childhood, no problems"   History of kidney stones    Hypertension    followed pcp   IBS (irritable bowel syndrome)    Iron deficiency anemia    12-06-2017 per pt recently had IV iron infusion July 2019   Low vitamin D level    Osteoarthritis    pt unsure about this   PVC (premature ventricular contraction)    per pt had holter monitor   SLE (systemic lupus erythematosus) ([place])    rheumotologist-- dr syed   Stroke [facility]) 06/2020   Wears glasses     Past Surgical History:  Procedure Laterality Date   CERVICAL DISC ARTHROPLASTY N/A 10/23/2015   Procedure: Cervical Disc Arthroplasty Cervical six-seven;  Surgeon: Eustace Moore, MD;  Location: [facility];  Service: Neurosurgery;  Laterality: N/A;   D & C HYSTEROSCOPY W/ RESECTION ENDOMETRIAL POLYP  10-13-2003    dr rivard '@[facility]'    ESOPHAGOGASTRODUODENOSCOPY     for GERD   IR ANGIO INTRA EXTRACRAN SEL COM CAROTID INNOMINATE BILAT MOD SED  07/06/2020   IR ANGIO INTRA EXTRACRAN SEL INTERNAL CAROTID BILAT MOD SED  06/29/2020   IR ANGIO  VERTEBRAL SEL VERTEBRAL BILAT MOD SED  06/29/2020   IR ANGIO VERTEBRAL SEL VERTEBRAL BILAT MOD SED  07/06/2020   LAPAROSCOPIC ABDOMINAL EXPLORATION  1996   dx ibs   OVARIAN CYST SURGERY  age 12   x2 cyst   RADIOLOGY WITH ANESTHESIA N/A 06/29/2020   Procedure: IR WITH ANESTHESIA;  Surgeon: Radiologist, Medication, MD;  Location: [facility];  Service: Radiology;  Laterality: N/A;   ROBOTIC ASSISTED TOTAL HYSTERECTOMY N/A 12/13/2017   Procedure: XI ROBOTIC ASSISTED TOTAL HYSTERECTOMYWITH BILATERAL SALPINGECTOMY;  Surgeon: Christophe Louis, MD;  [facility];  Service: Gynecology;  Laterality: N/A;   TUBAL LIGATION Bilateral 06-01-2002   dr Mancel Bale '@[facility]    PPTL    There were no vitals filed for this visit.   Subjective Assessment - 06/09/21 1245     Subjective "I have this but I can't do the calendar on the fridge"    Patient is accompained by: Family member   spouse   Currently in Pain? No/denies                   ADULT SLP TREATMENT - 06/09/21 1246       General Information   Behavior/Cognition Alert;Distractible;Requires cueing;Pleasant mood;Cooperative  Treatment Provided   Treatment provided Cognitive-Linquistic      Cognitive-Linquistic Treatment   Treatment focused on Cognition;Patient/family/caregiver education    Skilled Treatment Amy Barry brought in to do list and has checked off what she has completed. She started another series with subtitles and she is on episode 9 and gave synopsis with rare questioning cues. She is completing small chores her family gives her with some episodes of "zoning out" Ongoing discussion of listening to her body and taking a break before the has an episode. She reports she is going to write down "very important stuff" in her book, but has not started this yet.      Assessment / Recommendations / Plan   Plan Continue with current plan of care      Progression Toward Goals   Progression toward goals Progressing toward goals               SLP Education - 06/09/21 1327     Education Details compensations for attention, memory, brain fog    Person(s) Educated Patient;Spouse    Methods Explanation;Demonstration;Verbal cues;Handout    Comprehension Verbalized understanding;Returned demonstration;Need further instruction              SLP Short Term Goals - 06/09/21 1328       SLP SHORT TERM GOAL #1   Title Complete CLQT to determine extent of cognitive deficits.    Time 2    Period Weeks    Status Achieved      SLP SHORT TERM GOAL #2   Title Pt will manage her calendar and appointments with rare min A from family over 2 weeks    Period Weeks    Status Not Met      SLP SHORT TERM GOAL #3   Title Pt will sort pills into organizer independently with supervision cues from family    Time 1    Period Weeks    Status Not Met      SLP SHORT TERM GOAL #4   Title Pt will pay 3 bills with occasional min A from family    Time 1    Period Weeks    Status Not Met      SLP SHORT TERM GOAL #5   Title Pt will carryover 3 compensatory strategies to complete 2 IADL's with occasional min A from family    Time 1    Period Weeks    Status Partially Met              SLP Long Term Goals - 06/09/21 [place] #1   Title Pt will independently manage her daily schedule, appointments, to do lists over 1 week    Status Not Met      SLP LONG TERM GOAL #2   Title Pt will use compensations for organization and attention to pay 5 bills with rare min A from family    Status Not Met      SLP LONG TERM GOAL #3   Title Pt will be independent in medication management with no A from family    Status Not Met      SLP LONG TERM GOAL #4   Title Pt will improve score on NeuroQOL Cognitive Function PROM (initial score 39) by 4 points    Time 6    Period Weeks    Status On-going   ongoing for recert     SLP LONG TERM GOAL #5  Title Pt will carryover compensatory strategies for alternating attention in  cooking and IADL's with rare min A from family    Status Not Met      SLP LONG TERM GOAL #6   Title Pt will verbalize and implement 3 functional compensations to aid recall and attention for completion of small functional tasks at home given occasional min A over 2 sessions    Baseline 06/09/21    Time 6    Period Weeks    Status On-going      SLP LONG TERM GOAL #7   Title Pt will verbally discuss movies/television shows/MD appointments with external compensatory aids to aid recall and verbal expression given occasional min A over 2 sessions    Baseline 06-03-21; 06/07/21    Time 6    Period Weeks    Status Achieved              Plan - 06/09/21 1330     Clinical Impression Statement Pt has continued two new prescriptions for attention and anxiety, in which some positive outcomes reported. SLP educated and instructed recommended compensatory strateiges to optimize recall and attention for household related tasks to improve functional independence. Improved carryover of compensations demonstrated this session, which has been helpful for patient. Continue skilled ST to maxmize safety, independence, and return to PLOF.    Speech Therapy Frequency 1x /week    Duration 8 weeks    Treatment/Interventions Compensatory strategies;Cueing hierarchy;Functional tasks;Patient/family education;Diet toleration management by SLP;Environmental controls;Cognitive reorganization;Multimodal communcation approach;Compensatory techniques;Internal/external aids;SLP instruction and feedback    Potential to Achieve Goals Fair    Potential Considerations Ability to learn/carryover information;Cooperation/participation level;Severity of impairments             Patient will benefit from skilled therapeutic intervention in order to improve the following deficits and impairments:   Cognitive communication deficit    Problem List Patient Active Problem List   Diagnosis Date Noted   Generalized anxiety  disorder 05/05/2021   Biceps tendonitis on left 02/10/2021   Vascular headache 08/05/2020   Hyponatremia 07/28/2020   Headache 07/24/2020   Neck pain, musculoskeletal 07/24/2020   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    Subarachnoid bleed ([place]) 07/09/2020   Subarachnoid hemorrhage ([place]) 06/29/2020   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[place]) 03/04/2019   S/P hysterectomy 12/13/2017   Abnormal laboratory test 09/09/2016   Gait abnormality 08/03/2016   Weakness 08/03/2016   Paresthesia 08/03/2016   S/P cervical spinal fusion 10/23/2015   Hypersomnia 12/08/2010   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   EPIGASTRIC PAIN 10/13/2009   IRRITABLE BOWEL SYNDROME 03/10/2009   BACK PAIN, LUMBAR 07/01/2008   COSTOCHONDRITIS 02/01/2008   VIRAL URI 01/08/2008   CHEST PAIN 01/08/2008   OTHER SPECIFIED EPISODIC MOOD DISORDER 12/25/2007   ARM PAIN 12/25/2007   ANEMIA-NOS 10/02/2007   Essential hypertension 10/02/2007   GERD 10/02/2007   POLYP, GALLBLADDER 10/02/2007   UTERINE POLYP 10/02/2007   OSTEOARTHRITIS 10/02/2007   Dyskinesia 10/02/2007   HEADACHE 10/02/2007   PALPITATIONS 10/02/2007   CARDIAC MURMUR, HX OF 10/02/2007   NEPHROLITHIASIS, HX OF 10/02/2007    Latrell Reitan, Annye Rusk, CCC-SLP 06/09/2021, 1:31 PM  [facility] [address] [place], [place], [address] Phone: [phone]   Fax:  [phone]   Name: Amy Barry MRN: [id number] Date of Birth: 08/17/1970

## 2021-06-09 NOTE — Patient Instructions (Addendum)
? ? ? ?  If you have trouble figuring out a plot point, google the show and see if the summary explains ? ?Great job using your to do list ? ?Great job participating in more household activities ? ?Have family help you say the steps in a task before you do it ? ?Great job having all ingredients and supplies out before you start to cook ? ?When you have an episode, take a break in a quiet dark room ? ?Pay attention to your body and brain - try to remove yourself and take a break BEFORE the episode so you can "pre-treat" before you zone out ? ? ?

## 2021-06-16 ENCOUNTER — Ambulatory Visit: Payer: No Typology Code available for payment source

## 2021-06-16 ENCOUNTER — Ambulatory Visit: Payer: No Typology Code available for payment source | Admitting: Speech Pathology

## 2021-06-17 ENCOUNTER — Other Ambulatory Visit: Payer: Self-pay

## 2021-06-17 ENCOUNTER — Encounter: Payer: Self-pay | Admitting: Physical Therapy

## 2021-06-17 ENCOUNTER — Ambulatory Visit: Payer: No Typology Code available for payment source | Admitting: Physical Therapy

## 2021-06-17 ENCOUNTER — Ambulatory Visit: Payer: No Typology Code available for payment source

## 2021-06-17 NOTE — Therapy (Addendum)
[place] [facility] [facility], [place], [address] ?Phone: [phone]   Fax:  [phone] ? ?Speech Language Pathology Treatment/Discharge ? ?Patient Details  ?Name: Amy Barry ?MRN: [id number] ?Date of Birth: June 11, 1970 ?Referring Provider (SLP): Alger Simons MD ? ? ?Encounter Date: 06/17/2021 ? ? End of Session - 06/17/21 0844   ? ? Visit Number 15   ? Number of Visits 19   ? Date for SLP Re-Evaluation 07/02/21   ? Authorization Type Aetna   ? SLP Start Time 0845   ? SLP Stop Time  0926   ? SLP Time Calculation (min) 41 min   ? Activity Tolerance Patient tolerated treatment well   ? ?  ?  ? ?  ? ? ?Past Medical History:  ?Diagnosis Date  ? Anxiety   ? Arthralgia   ? Generalized weakness   ? bilateral upper and lower extremities,  walks with cane  ? GERD (gastroesophageal reflux disease)   ? Headache(784.0)   ? Heart murmur   ? per pt "since childhood, no problems"  ? History of kidney stones   ? Hypertension   ? followed pcp  ? IBS (irritable bowel syndrome)   ? Iron deficiency anemia   ? 12-06-2017 per pt recently had IV iron infusion July 2019  ? Low vitamin D level   ? Osteoarthritis   ? pt unsure about this  ? PVC (premature ventricular contraction)   ? per pt had holter monitor  ? SLE (systemic lupus erythematosus) ([place])   ? rheumotologist-- dr syed  ? Stroke [facility]) 06/2020  ? Wears glasses   ? ? ?Past Surgical History:  ?Procedure Laterality Date  ? CERVICAL DISC ARTHROPLASTY N/A 10/23/2015  ? Procedure: Cervical Disc Arthroplasty Cervical six-seven;  Surgeon: Eustace Moore, MD;  Location: [place] [facility];  Service: Neurosurgery;  Laterality: N/A;  ? D & C HYSTEROSCOPY W/ RESECTION ENDOMETRIAL POLYP  10-13-2003    dr rivard _0   ? ESOPHAGOGASTRODUODENOSCOPY    ? for GERD  ? IR ANGIO INTRA EXTRACRAN SEL COM CAROTID INNOMINATE BILAT MOD SED  07/06/2020  ? IR ANGIO INTRA EXTRACRAN SEL INTERNAL CAROTID BILAT MOD SED  06/29/2020  ? IR ANGIO VERTEBRAL  SEL VERTEBRAL BILAT MOD SED  06/29/2020  ? IR ANGIO VERTEBRAL SEL VERTEBRAL BILAT MOD SED  07/06/2020  ? LAPAROSCOPIC ABDOMINAL EXPLORATION  1996  ? dx ibs  ? OVARIAN CYST SURGERY  age 72  ? x2 cyst  ? RADIOLOGY WITH ANESTHESIA N/A 06/29/2020  ? Procedure: IR WITH ANESTHESIA;  Surgeon: Radiologist, Medication, MD;  Location: [facility];  Service: Radiology;  Laterality: N/A;  ? ROBOTIC ASSISTED TOTAL HYSTERECTOMY N/A 12/13/2017  ? Procedure: XI ROBOTIC ASSISTED TOTAL HYSTERECTOMYWITH BILATERAL SALPINGECTOMY;  Surgeon: Christophe Louis, MD;  Location: [facility];  Service: Gynecology;  Laterality: N/A;  ? TUBAL LIGATION Bilateral 06-01-2002   dr Mancel Bale _1   ? PPTL  ? ? ?There were no vitals filed for this visit. ? ? Subjective Assessment - 06/17/21 0844   ? ? Subjective "okay I guess"   ? Patient is accompained by: Family member   husband  ? Currently in Pain? Yes   ? Pain Score 4    ? Pain Location Arm   and headache  ? ?  ?  ? ?  ? ? ? ?SPEECH THERAPY DISCHARGE SUMMARY ? ?Visits from Start of Care: 15 ? ?Current functional level related to goals / functional outcomes: ?Pt has exhibited some improved  carryover of learned SLP strategies and techniques to optimize cognitive linguistic skills. Pt is pleased with current progress and agreeable to ST discharge today.  ?  ?Remaining deficits: ?Baseline cognitive linguistic deficits ?  ?Education / Equipment: ?Aeronautical engineer, external aids, metacognition, caregiver education  ? ?Patient agrees to discharge. Patient goals were partially met. Patient is being discharged due to being pleased with the current functional level. ? ? ? ? ADULT SLP TREATMENT - 06/17/21 0843   ? ?  ? General Information  ? Behavior/Cognition Alert;Distractible;Requires cueing;Pleasant mood;Cooperative   ?  ? Treatment Provided  ? Treatment provided Cognitive-Linquistic   ?  ? Cognitive-Linquistic Treatment  ? Treatment focused on Cognition;Patient/family/caregiver education   ? Skilled Treatment Annais entered  with reported fatigue and pain. Pt continues with external memory book to aid recall of to-do lists with success. Pt exhibits improved carryover of SLP recommendations. Pt exhibited WNL verbal expression (self-correct word finding x1 with extra time) but pt exhibiting "episode" mid-conversation about home tasks. SLP cued rest break and instructed deep breathing and relaxation to reduce episode symptoms. Husband reports episodes continue to happen when patient is stressed or has too much to process. SLP provided education re: discharge summary and handout with recommendations.   ?  ? Assessment / Recommendations / Plan  ? Plan Discharge SLP treatment due to (comment)   POC complete  ?  ? Progression Toward Goals  ? Progression toward goals Goals partially met, education completed, patient discharged from SLP   ? ?  ?  ? ?  ? ? ? SLP Education - 06/17/21 0907   ? ? Education Details compensations for cognitive linguistic skills, discharge summary   ? Person(s) Educated Patient;Spouse   ? Methods Explanation;Demonstration   ? Comprehension Verbalized understanding;Returned demonstration   ? ?  ?  ? ?  ? ? ? SLP Short Term Goals - 06/17/21 0933   ? ?  ? SLP SHORT TERM GOAL #1  ? Title Complete CLQT to determine extent of cognitive deficits.   ? Status Achieved   ?  ? SLP SHORT TERM GOAL #2  ? Title Pt will manage her calendar and appointments with rare min A from family over 2 weeks   ? Status Not Met   ?  ? SLP SHORT TERM GOAL #3  ? Title Pt will sort pills into organizer independently with supervision cues from family   ? Status Not Met   ?  ? SLP SHORT TERM GOAL #4  ? Title Pt will pay 3 bills with occasional min A from family   ? Status Not Met   ?  ? SLP SHORT TERM GOAL #5  ? Title Pt will carryover 3 compensatory strategies to complete 2 IADL's with occasional min A from family   ? Status Partially Met   ? ?  ?  ? ?  ? ? ? SLP Long Term Goals - 06/17/21 0911   ? ?  ? SLP LONG TERM GOAL #1  ? Title Pt will  independently manage her daily schedule, appointments, to do lists over 1 week   ? Status Not Met   ?  ? SLP LONG TERM GOAL #2  ? Title Pt will use compensations for organization and attention to pay 5 bills with rare min A from family   ? Status Not Met   ?  ? SLP LONG TERM GOAL #3  ? Title Pt will be independent in medication management with no A from  family   ? Status Not Met   ?  ? SLP LONG TERM GOAL #4  ? Title Pt will improve score on NeuroQOL Cognitive Function PROM (initial score 39) by 4 points   ? Status Deferred   deferred due to episode  ?  ? SLP LONG TERM GOAL #5  ? Title Pt will carryover compensatory strategies for alternating attention in cooking and IADL's with rare min A from family   ? Status Not Met   ?  ? SLP LONG TERM GOAL #6  ? Title Pt will verbalize and implement 3 functional compensations to aid recall and attention for completion of small functional tasks at home given occasional min A over 2 sessions   ? Baseline 06/09/21; 06/17/21   ? Status Achieved   ?  ? SLP LONG TERM GOAL #7  ? Title Pt will verbally discuss movies/television shows/MD appointments with external compensatory aids to aid recall and verbal expression given occasional min A over 2 sessions   ? Baseline 06-03-21; 06/07/21   ? Status Achieved   ? ?  ?  ? ?  ? ? ? Plan - 06/17/21 0844   ? ? Clinical Impression Statement SLP completed education and training of recommended compensatory strateiges to optimize recall and attention for household related tasks to improve functional independence. Improved carryover of compensations demonstrated this session, which has been helpful for patient. Pt is pleased with current progress and agreeable to ST discharge this date.   ? Speech Therapy Frequency 1x /week   ? Duration 8 weeks   ? Treatment/Interventions Compensatory strategies;Cueing hierarchy;Functional tasks;Patient/family education;Diet toleration management by SLP;Environmental controls;Cognitive reorganization;Multimodal  communcation approach;Compensatory techniques;Internal/external aids;SLP instruction and feedback   ? Potential to [place]   ? Potential Considerations Ability to learn/carryover information;Cooperation/partici

## 2021-06-17 NOTE — Patient Instructions (Addendum)
Continue to use your book to help your memory (to-do lists, things you want/need to remember, daily events if needed) ? ?If you have trouble remembering something, use a strategy: ? ?W - Write it down ? ?A - Associate it with something (connect it with something else)  ? ?R - Repeat it ? ?M - Mental Image (picture it in your head) ? ? ?You are doing a good job being more engaged at home. Keep this up! ? ? ?

## 2021-06-17 NOTE — Patient Instructions (Addendum)
Access Code: [email or web address] ?URL: [email or web address] ?Date: 06/17/2021 ?Prepared by: Elease Etienne ? ?Exercises ?Sit to Stand with Armchair - 1 x daily - 4 x weekly - 2 sets - 6 reps-therapist emphasized use of one arm for descent and one arm on RW as pt was having difficulty initiating lower to mat table today.  Able to progress to use of single UE to push up from mat. ?Seated Reaching to Side - 1 x daily - 4 x weekly - 3 sets - 10 reps ?Sit to Stand with Arm Reach Toward Target - 1 x daily - 4 x weekly - 2 sets - 6 reps - Explained to pt and husband:  The goal is to [facility] the bottom by transferring weight forward.  Goal is not to complete sit to stand at this with this method at this point, but to squeeze the bottom and attempt to lift from seat while maintaining forward lean. ?Standing March with Counter Support - 1 x daily - 4 x weekly - 2 sets - 15 reps - performed at RW using cue to reach knee to bar of RW and facilitation of weight shift to prevent excessive sway ? ?Added walking with family 30 mins per day, 5 days per week using RW in well lit familiar environment such as inside home; edu to break up 9mns into manageable intervals of time to get improved quality of walking and mitigate fatigue. ? ?Instructions on use of seated stepper setup patient already has to continue LE strengthening at home. ?

## 2021-06-17 NOTE — Therapy (Addendum)
[facility] [address] [place], [place], [address] Phone: [phone]   Fax:  [phone]  Physical Therapy Treatment  Patient Details  Name: Amy Barry MRN: [id number] Date of Birth: May 31, 1970 Referring Provider (PT): Dr. Riley Kill   Encounter Date: 06/17/2021   PT End of Session - 06/17/21 0937     Visit Number 11    Number of Visits 14    Date for PT Re-Evaluation 06/25/21    Authorization Type Aetna    PT Start Time 0930    PT Stop Time 1023    PT Time Calculation (min) 53 min    Equipment Utilized During Treatment Gait belt    Activity Tolerance Patient tolerated treatment well;Patient limited by fatigue;Other (comment)   lability   Behavior During Therapy WFL for tasks assessed/performed;Anxious             Past Medical History:  Diagnosis Date   Anxiety    Arthralgia    Generalized weakness    bilateral upper and lower extremities,  walks with cane   GERD (gastroesophageal reflux disease)    Headache(784.0)    Heart murmur    per pt "since childhood, no problems"   History of kidney stones    Hypertension    followed pcp   IBS (irritable bowel syndrome)    Iron deficiency anemia    12-06-2017 per pt recently had IV iron infusion July 2019   Low vitamin D level    Osteoarthritis    pt unsure about this   PVC (premature ventricular contraction)    per pt had holter monitor   SLE (systemic lupus erythematosus) ([facility])    rheumotologist-- dr syed   Stroke [facility]) 06/2020   Wears glasses     Past Surgical History:  Procedure Laterality Date   CERVICAL DISC ARTHROPLASTY N/A 10/23/2015   Procedure: Cervical Disc Arthroplasty Cervical six-seven;  Surgeon: Tia Alert, MD;  Location: [facility];  Service: Neurosurgery;  Laterality: N/A;   D & C HYSTEROSCOPY W/ RESECTION ENDOMETRIAL POLYP  10-13-2003    dr rivard @[facility]    ESOPHAGOGASTRODUODENOSCOPY     for GERD   IR ANGIO INTRA EXTRACRAN SEL COM  CAROTID INNOMINATE BILAT MOD SED  07/06/2020   IR ANGIO INTRA EXTRACRAN SEL INTERNAL CAROTID BILAT MOD SED  06/29/2020   IR ANGIO VERTEBRAL SEL VERTEBRAL BILAT MOD SED  06/29/2020   IR ANGIO VERTEBRAL SEL VERTEBRAL BILAT MOD SED  07/06/2020   LAPAROSCOPIC ABDOMINAL EXPLORATION  1996   dx ibs   OVARIAN CYST SURGERY  age 6   x2 cyst   RADIOLOGY WITH ANESTHESIA N/A 06/29/2020   Procedure: IR WITH ANESTHESIA;  Surgeon: Radiologist, Medication, MD;  Location: [facility];  Service: Radiology;  Laterality: N/A;   ROBOTIC ASSISTED TOTAL HYSTERECTOMY N/A 12/13/2017   Procedure: XI ROBOTIC ASSISTED TOTAL HYSTERECTOMYWITH BILATERAL SALPINGECTOMY;  Surgeon: Gerald Leitz, MD;  Location: [facility];  Service: Gynecology;  Laterality: N/A;   TUBAL LIGATION Bilateral 06-01-2002   dr Su Hilt @[facility]    PPTL    There were no vitals filed for this visit.   Subjective Assessment - 06/17/21 0933     Subjective Pt states she would like HEP to do with family supervision.  She reports no changes, but had an episode in speech where she zoned out.  She states she thinks she slept okay last night.  She is no longer able to return to work as she was let go 2 days  ago.    Patient is accompained by: Family member    Pertinent History medical history of generalized arthritis and myalgias and concern for functional weakness, SLE, irritable bowel, Subarachnoid hemorrhage    Limitations Standing;Walking;Writing;House hold activities;Reading;Lifting    How long can you sit comfortably? no issues    How long can you stand comfortably? 2 min    How long can you walk comfortably? 5 min    Patient Stated Goals Improve independence    Currently in Pain? Yes    Pain Score 5     Pain Location Head    Pain Orientation Anterior    Pain Descriptors / Indicators Aching    Pain Type Chronic pain    Pain Onset More than a month ago    Pain Score 3    Pain Location Arm    Pain Orientation Left    Pain Descriptors / Indicators Aching    Pain Type  Chronic pain    Pain Onset More than a month ago                               [facility] Adult PT Treatment/Exercise - 06/17/21 4098       Neuro Re-ed    Neuro Re-ed Details  Pt stands at countertop using single UE support w/ CGA and hand over hand support from therapist on opp side to walk length of countertop x3.  Cued for upright posture, decreased use of visualization of feet.  Therapist decreased hand over hand support throughout activity.  Mild ataxia of trunk noted throughout with decreased initiation of swing through bilaterally.  Pt stands at RW and EOM using alt UE support with narrowed BOS standing x , emphasis on relaxing opp arm by side.  Decreased truncal ataxia noted.      Exercises   Exercises Other Exercises    Other Exercises  HEP initiated for strength, coordination and balance.  See pt instructions.      Knee/Hip Exercises: Aerobic   Nustep L3 x27mins, therapist progressively increased step goal at 2 min intervals to increase challenge: 25>40>45>50 steps/min avg; achieved 43 steps per min avg; pt states it is challenging to inc steps, but she would like to use seated stepper at home to continue this activity                     PT Education - 06/17/21 1007     Education Details Edu on walking program in home environment with supervision and use of RW and HEP with supervision/guarding when needed and importance of aerobic activity and proper set up for using their seated stepper at home.  Edu on not using cane at home due to ataxia as pt expresses frustration with RW.  Continued edu on progress and current functional level to address pt questions and concerns.    Person(s) Educated Patient;Spouse   husband-Andre   Methods Explanation;Demonstration;Tactile cues;Verbal cues;Handout    Comprehension Verbalized understanding;Returned demonstration              PT Short Term Goals - 06/03/21 1359       PT SHORT TERM GOAL #1   Title Pt  will be able to perform HEP for strengthening, balance and aerobic activity with family to continue gains at home.    Baseline Not able to do all exercises each day due to fatigue    Time 4    Period Weeks  Status Partially Met    Target Date 06/03/21      PT SHORT TERM GOAL #2   Title Pt will decrease 5 x sit to stand from 48.08 sec to <44 sec for improved balance and functional strength.    Baseline 05/06/21 48.08 sec from chair with hands > 40 seconds    Time 4    Period Weeks    Status Achieved    Target Date 06/03/21      PT SHORT TERM GOAL #3   Title Pt will increase gait speed from 0.62m/s to >0.15m/s for improved household mobility.    Baseline 05/06/21 0.33m/s > .43 m/sec    Time 4    Period Weeks    Status Partially Met    Target Date 06/03/21               PT Long Term Goals - 06/03/21 1406       PT LONG TERM GOAL #1   Title Pt will decrease TUG from 40.63 sec to <36 sec for improved balance and functional mobility.(LTGs due 06/25/21)    Baseline 05/06/21 40.63 sec with RW > 39 seconds with RW on 06/03/21    Time 8    Period Weeks    Status On-going    Target Date 06/25/21      PT LONG TERM GOAL #2   Title Pt will increase 6 min walk distance to >325' with RW and RPE <6/10 for improved activity tolerance.    Baseline 05/06/21 240' with 6/10 RPE    Time 8    Period Weeks    Status New    Target Date 06/25/21      PT LONG TERM GOAL #3   Title Pt will be able to maintain standing without UE support x 1 min for improved standing ADLs supervision.    Baseline 05/06/21 10 sec    Time 8    Period Weeks    Status New    Target Date 06/25/21      PT LONG TERM GOAL #4   Title Pt will ambulate up/down 8 steps with 1 railing mod I for improved home access with RPE <6/10    Baseline 16 stairs with one UE support on rail and min A    Time 8    Period Weeks    Status On-going    Target Date 06/25/21                   Plan - 06/17/21 1153     Clinical  Impression Statement Skilled session focused on addressing aerobic needs of patient and initiating HEP directed at functional strength, coordination, and balance with supervision from husband and daughters.  Brief trials of static balance task with decreasing UE support.  Extensive edu provided throughout session today with therapeutic listening provided by therapist as patient was more labile than normal expressing several concerns and worries about lack of progress and inability to use a cane instead of her RW.  She appeared more fatigued throughout session today requiring more rest and demonstrating increased trunk and LE ataxia as well as difficulty initiating descent from standing and swing through with brief trial of gait at countertop.  Continue per POC.    Personal Factors and Comorbidities Comorbidity 2;Past/Current Experience;Time since onset of injury/illness/exacerbation    Comorbidities medical history of generalized arthritis and myalgias and concern for functional weakness, SLE, irritable bowel,    Examination-Activity Limitations Bathing;Caring for Others;Carry;Dressing;Hygiene/Grooming;Lift;Locomotion Level;Reach Overhead;Squat;Stairs;Stand;Transfers  Examination-Participation Restrictions Church;Cleaning;Community Activity;Driving;Laundry;Medication Management;Meal Prep;Occupation;Shop;Yard Work;Personal Finances    Stability/Clinical Decision Making Evolving/Moderate complexity    Rehab Potential Good    PT Frequency 1x / week    PT Duration 8 weeks    PT Treatment/Interventions ADLs/Self Care Home Management;Cryotherapy;Moist Heat;Traction;Gait training;Stair training;Functional mobility training;Therapeutic activities;Therapeutic exercise;Balance training;Manual techniques;Orthotic Fit/Training;Patient/family education;Cognitive remediation;Neuromuscular re-education;Passive range of motion;Energy conservation;Vestibular;Visual/perceptual remediation/compensation;Joint  Manipulations;Spinal Manipulations    PT Next Visit Plan Assess STGs-due 3/24.  Stairs with one UE support.  Weight shifting forwards during sit > stand. Follow-up about sleep.  Big focus will be on trying to increase aerobic activity. Continue more functional activities. Continue NuStep for some aerobic activity and strengthening. Add to HEP with family assist.  work on transfers with proper hand placement, standing balance activities trying to decrease UE support as able, gait training    PT Home Exercise Plan [email or web address] - therapist could not find code to previous HEP addressed in STGs.    Consulted and Agree with Plan of Care Patient;Family member/caregiver    Family Member Consulted husband             Patient will benefit from skilled therapeutic intervention in order to improve the following deficits and impairments:  Abnormal gait, Decreased activity tolerance, Decreased balance, Decreased cognition, Decreased safety awareness, Decreased range of motion, Decreased mobility, Decreased endurance, Decreased strength, Difficulty walking, Dizziness, Impaired flexibility, Increased fascial restricitons, Hypomobility, Impaired tone, Impaired UE functional use, Improper body mechanics, Postural dysfunction, Pain  Visit Diagnosis: Other abnormalities of gait and mobility  Muscle weakness (generalized)  Unsteadiness on feet  Lack of coordination     Problem List Patient Active Problem List   Diagnosis Date Noted   Generalized anxiety disorder 05/05/2021   Biceps tendonitis on left 02/10/2021   Vascular headache 08/05/2020   Hyponatremia 07/28/2020   Headache 07/24/2020   Neck pain, musculoskeletal 07/24/2020   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    Subarachnoid bleed ([facility]) 07/09/2020   Subarachnoid hemorrhage ([facility]) 06/29/2020   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[facility]CC) 03/04/2019   S/P hysterectomy 12/13/2017   Abnormal laboratory test 09/09/2016   Gait abnormality  08/03/2016   Weakness 08/03/2016   Paresthesia 08/03/2016   S/P cervical spinal fusion 10/23/2015   Hypersomnia 12/08/2010   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   EPIGASTRIC PAIN 10/13/2009   IRRITABLE BOWEL SYNDROME 03/10/2009   BACK PAIN, LUMBAR 07/01/2008   COSTOCHONDRITIS 02/01/2008   VIRAL URI 01/08/2008   CHEST PAIN 01/08/2008   OTHER SPECIFIED EPISODIC MOOD DISORDER 12/25/2007   ARM PAIN 12/25/2007   ANEMIA-NOS 10/02/2007   Essential hypertension 10/02/2007   GERD 10/02/2007   POLYP, GALLBLADDER 10/02/2007   UTERINE POLYP 10/02/2007   OSTEOARTHRITIS 10/02/2007   Dyskinesia 10/02/2007   HEADACHE 10/02/2007   PALPITATIONS 10/02/2007   CARDIAC MURMUR, HX OF 10/02/2007   NEPHROLITHIASIS, HX OF 10/02/2007    Sadie Haber, PT, DPT 06/17/2021, 12:09 PM  [facility] [address] [place], [place], [address] Phone: [phone]   Fax:  [phone]  Name: Amy Barry MRN: [id number] Date of Birth: 11-Jul-1970

## 2021-06-24 ENCOUNTER — Ambulatory Visit: Payer: No Typology Code available for payment source | Admitting: Physical Therapy

## 2021-06-24 ENCOUNTER — Other Ambulatory Visit: Payer: Self-pay

## 2021-06-24 DIAGNOSIS — R279 Unspecified lack of coordination: Secondary | ICD-10-CM

## 2021-06-24 DIAGNOSIS — R2689 Other abnormalities of gait and mobility: Secondary | ICD-10-CM

## 2021-06-24 DIAGNOSIS — R2681 Unsteadiness on feet: Secondary | ICD-10-CM

## 2021-06-24 DIAGNOSIS — M6281 Muscle weakness (generalized): Secondary | ICD-10-CM

## 2021-06-24 NOTE — Therapy (Signed)
[facility] ?[place] ?[place], [place], [address] ?Phone: [phone]   Fax:  [phone] ? ?Physical Therapy Treatment ? ?Patient Details  ?Name: Amy Barry ?MRN: [id number] ?Date of Birth: Aug 01, 1970 ?Referring Provider (PT): Dr. Naaman Plummer ? ? ?Encounter Date: 06/24/2021 ? ? PT End of Session - 06/24/21 1155   ? ? Visit Number 12   ? Number of Visits 14   ? Date for PT Re-Evaluation 06/25/21   ? Authorization Type Aetna   ? PT Start Time 1150 - 1230  (40 minutes)  ? Equipment Utilized During Treatment Gait belt   ? Activity Tolerance Patient tolerated treatment well  ? Behavior During Therapy [facility] for tasks assessed/performed;Anxious   ? ?  ?  ? ?  ? ? ?Past Medical History:  ?Diagnosis Date  ? Anxiety   ? Arthralgia   ? Generalized weakness   ? bilateral upper and lower extremities,  walks with cane  ? GERD (gastroesophageal reflux disease)   ? Headache(784.0)   ? Heart murmur   ? per pt "since childhood, no problems"  ? History of kidney stones   ? Hypertension   ? followed pcp  ? IBS (irritable bowel syndrome)   ? Iron deficiency anemia   ? 12-06-2017 per pt recently had IV iron infusion July 2019  ? Low vitamin D level   ? Osteoarthritis   ? pt unsure about this  ? PVC (premature ventricular contraction)   ? per pt had holter monitor  ? SLE (systemic lupus erythematosus) ([place])   ? rheumotologist-- dr syed  ? Stroke [facility]) 06/2020  ? Wears glasses   ? ? ?Past Surgical History:  ?Procedure Laterality Date  ? CERVICAL DISC ARTHROPLASTY N/A 10/23/2015  ? Procedure: Cervical Disc Arthroplasty Cervical six-seven;  Surgeon: Eustace Moore, MD;  Location: [facility];  Service: Neurosurgery;  Laterality: N/A;  ? D & C HYSTEROSCOPY W/ RESECTION ENDOMETRIAL POLYP  10-13-2003    dr rivard '@[facility]'   ? ESOPHAGOGASTRODUODENOSCOPY    ? for GERD  ? IR ANGIO INTRA EXTRACRAN SEL COM CAROTID INNOMINATE BILAT MOD SED  07/06/2020  ? IR ANGIO INTRA EXTRACRAN SEL INTERNAL CAROTID  BILAT MOD SED  06/29/2020  ? IR ANGIO VERTEBRAL SEL VERTEBRAL BILAT MOD SED  06/29/2020  ? IR ANGIO VERTEBRAL SEL VERTEBRAL BILAT MOD SED  07/06/2020  ? LAPAROSCOPIC ABDOMINAL EXPLORATION  1996  ? dx ibs  ? OVARIAN CYST SURGERY  age 48  ? x2 cyst  ? RADIOLOGY WITH ANESTHESIA N/A 06/29/2020  ? Procedure: IR WITH ANESTHESIA;  Surgeon: Radiologist, Medication, MD;  Location: [place];  Service: Radiology;  Laterality: N/A;  ? ROBOTIC ASSISTED TOTAL HYSTERECTOMY N/A 12/13/2017  ? Procedure: XI ROBOTIC ASSISTED TOTAL HYSTERECTOMYWITH BILATERAL SALPINGECTOMY;  Surgeon: Christophe Louis, MD;  Location: [facility];  Service: Gynecology;  Laterality: N/A;  ? TUBAL LIGATION Bilateral 06-01-2002   dr Mancel Bale '@[facility]'   ? PPTL  ? ? ?There were no vitals filed for this visit. ? ? Subjective Assessment - 06/24/21 1530   ? ? Subjective Having a pretty good day today; is going to go sit out on her porch this afternoon and enjoy the weather.   ? Patient is accompained by: Family member   ? Pertinent History medical history of generalized arthritis and myalgias and concern for functional weakness, SLE, irritable bowel, Subarachnoid hemorrhage   ? Limitations Standing;Walking;Writing;House hold activities;Reading;Lifting   ? How long can you sit comfortably? no issues   ?  How long can you stand comfortably? 2 min   ? How long can you walk comfortably? 5 min   ? Patient Stated Goals Improve independence   ? Currently in Pain? Yes   ? Pain Score 3    ? Pain Location Head   ? Pain Onset More than a month ago   ? Pain Onset More than a month ago   ? ?  ?  ? ?  ? ? ? ? ? ? [facility] PT Assessment - 06/24/21 1156   ? ?  ? Assessment  ? Medical Diagnosis Subarachnoid Hemorrhage [facility]), L Bicep Tendonitis   ? Referring Provider (PT) Dr. Naaman Plummer   ? Onset Date/Surgical Date 02/10/21   ?  ? Home Environment  ? Type of Home House   ? Home Layout Two level   ? Alternate Level Stairs-Number of Steps 7+7   ? Alternate Level Stairs-Rails Right;Left   ?  ? Prior Function  ? Level  of Independence Independent   ?  ? Ambulation/Gait  ? Stairs Yes   ? Stairs Assistance 4: Min assist   ? Stairs Assistance Details (indicate cue type and reason) 4 stairs with one UE support on R rail and then 4 with one UE support on L rail; step to sequence with verbal cues to descend with LLE.   ? Stair Management Technique One rail Right;One rail Left;Step to pattern;Forwards   ? Number of Stairs 8   ? Height of Stairs 6   ?  ? 6 Minute Walk- Baseline  ? 6 Minute Walk- Baseline yes   ? BP (mmHg) 113/65   ? HR (bpm) 57   ? 02 Sat (%RA) 98 %   ? Modified Borg Scale for Dyspnea 1- Very mild shortness of breath   ? Perceived Rate of Exertion (Borg) 9- very light   ?  ? 6 Minute walk- Post Test  ? 6 Minute Walk Post Test yes   ? BP (mmHg) 132/79   ? HR (bpm) 62   ? 02 Sat (%RA) 99 %   ? Modified Borg Scale for Dyspnea 2- Mild shortness of breath   ? Perceived Rate of Exertion (Borg) 13- Somewhat hard   ?  ? 6 minute walk test results   ? Aerobic Endurance Distance Walked 554   ? Endurance additional comments with RW   ?  ? Standardized Balance Assessment  ? Standardized Balance Assessment Timed Up and Go Test   ?  ? Timed Up and Go Test  ? TUG Normal TUG   ? Normal TUG (seconds) 34.57   with RW; 2:20 with cane with quad tip  ? ?  ?  ? ?  ? ? ? PT Education - 06/24/21 1531   ? ? Education Details progress towards goals and areas to continue to address; discussed patient's personal goals and added more visits   ? Person(s) Educated Patient;Child(ren)   ? Methods Explanation   ? Comprehension Verbalized understanding   ? ?  ?  ? ?  ? ? ? ? PT Short Term Goals - 06/03/21 1359   ? ?  ? PT SHORT TERM GOAL #1  ? Title Pt will be able to perform HEP for strengthening, balance and aerobic activity with family to continue gains at home.   ? Baseline Not able to do all exercises each day due to fatigue   ? Time 4   ? Period Weeks   ? Status Partially Met   ?  Target Date 06/03/21   ?  ? PT SHORT TERM GOAL #2  ? Title Pt will  decrease 5 x sit to stand from 48.08 sec to <44 sec for improved balance and functional strength.   ? Baseline 05/06/21 48.08 sec from chair with hands > 40 seconds   ? Time 4   ? Period Weeks   ? Status Achieved   ? Target Date 06/03/21   ?  ? PT SHORT TERM GOAL #3  ? Title Pt will increase gait speed from 0.55ms to >0.41m for improved household mobility.   ? Baseline 05/06/21 0.3672m> .43 m/sec   ? Time 4   ? Period Weeks   ? Status Partially Met   ? Target Date 06/03/21   ? ?  ?  ? ?  ? ? ? ? PT Long Term Goals - 06/24/21 1230   ? ?  ? PT LONG TERM GOAL #1  ? Title Pt will decrease TUG from 40.63 sec to <36 sec for improved balance and functional mobility.(LTGs due 06/25/21)   ? Baseline 34.5 with RW   ? Time 8   ? Period Weeks   ? Status Achieved   ? Target Date 06/25/21   ?  ? PT LONG TERM GOAL #2  ? Title Pt will increase 6 min walk distance to >325' with RW and RPE <6/10 for improved activity tolerance.   ? Baseline 554' with RW   ? Time 8   ? Period Weeks   ? Status Achieved   ? Target Date 06/25/21   ?  ? PT LONG TERM GOAL #3  ? Title Pt will be able to maintain standing without UE support x 1 min for improved standing ADLs supervision.   ? Baseline for a few minutes per patient report   ? Time 8   ? Period Weeks   ? Status Achieved   ? Target Date 06/25/21   ?  ? PT LONG TERM GOAL #4  ? Title Pt will ambulate up/down 8 steps with 1 railing mod I for improved home access with RPE <6/10   ? Baseline 16 stairs with one UE support on rail and min A   ? Time 8   ? Period Weeks   ? Status Partially Met   ? Target Date 06/25/21   ? ?  ?  ? ?  ? ? ?New goals for recertification: ? PT Short Term Goals - 06/24/21 1544   ? ?  ? PT SHORT TERM GOAL #1  ? Title = LTG   ? ?  ?  ? ?  ? ? PT Long Term Goals - 06/24/21 1545   ? ?  ? PT LONG TERM GOAL #1  ? Title Pt will decrease TUG with cane to 60 seconds   ? Baseline 34.5 with RW; 2 minutes with cane   ? Time 8   ? Period Weeks   ? Status Revised   ? Target Date 08/23/21    ?  ? PT LONG TERM GOAL #2  ? Title Pt will increase 6 min walk distance to >350' with cane and RPE 12-13 for improved activity tolerance.   ? Baseline 554' with RW; not performed with cane yet   ? Time 8

## 2021-06-30 ENCOUNTER — Ambulatory Visit: Payer: No Typology Code available for payment source

## 2021-06-30 ENCOUNTER — Other Ambulatory Visit: Payer: Self-pay

## 2021-06-30 DIAGNOSIS — R2681 Unsteadiness on feet: Secondary | ICD-10-CM

## 2021-06-30 DIAGNOSIS — M6281 Muscle weakness (generalized): Secondary | ICD-10-CM

## 2021-06-30 DIAGNOSIS — R2689 Other abnormalities of gait and mobility: Secondary | ICD-10-CM

## 2021-06-30 NOTE — Therapy (Signed)
[facility] ?[place] ?[place], [place], [address] ?Phone: [phone]   Fax:  [phone] ? ?Physical Therapy Treatment ? ?Patient Details  ?Name: Amy Barry ?MRN: [id number] ?Date of Birth: 1970/11/22 ?Referring Provider (PT): Dr. Naaman Plummer ? ? ?Encounter Date: 06/30/2021 ? ? PT End of Session - 06/30/21 1156   ? ? Visit Number 13   ? Number of Visits 20   ? Date for PT Re-Evaluation 08/23/21   ? Authorization Type Aetna - through 09/01/21   ? PT Start Time 0100   PT running behind  ? PT Stop Time 1230   ? PT Time Calculation (min) 36 min   ? Equipment Utilized During Treatment Gait belt   ? Activity Tolerance Patient tolerated treatment well   ? Behavior During Therapy Anxious   ? ?  ?  ? ?  ? ? ?Past Medical History:  ?Diagnosis Date  ? Anxiety   ? Arthralgia   ? Generalized weakness   ? bilateral upper and lower extremities,  walks with cane  ? GERD (gastroesophageal reflux disease)   ? Headache(784.0)   ? Heart murmur   ? per pt "since childhood, no problems"  ? History of kidney stones   ? Hypertension   ? followed pcp  ? IBS (irritable bowel syndrome)   ? Iron deficiency anemia   ? 12-06-2017 per pt recently had IV iron infusion July 2019  ? Low vitamin D level   ? Osteoarthritis   ? pt unsure about this  ? PVC (premature ventricular contraction)   ? per pt had holter monitor  ? SLE (systemic lupus erythematosus) ([place])   ? rheumotologist-- dr syed  ? Stroke [facility]) 06/2020  ? Wears glasses   ? ? ?Past Surgical History:  ?Procedure Laterality Date  ? CERVICAL DISC ARTHROPLASTY N/A 10/23/2015  ? Procedure: Cervical Disc Arthroplasty Cervical six-seven;  Surgeon: Eustace Moore, MD;  Location: [place] [facility];  Service: Neurosurgery;  Laterality: N/A;  ? D & C HYSTEROSCOPY W/ RESECTION ENDOMETRIAL POLYP  10-13-2003    dr rivard '@[facility]   ? ESOPHAGOGASTRODUODENOSCOPY    ? for GERD  ? IR ANGIO INTRA EXTRACRAN SEL COM CAROTID INNOMINATE BILAT MOD SED  07/06/2020  ? IR  ANGIO INTRA EXTRACRAN SEL INTERNAL CAROTID BILAT MOD SED  06/29/2020  ? IR ANGIO VERTEBRAL SEL VERTEBRAL BILAT MOD SED  06/29/2020  ? IR ANGIO VERTEBRAL SEL VERTEBRAL BILAT MOD SED  07/06/2020  ? LAPAROSCOPIC ABDOMINAL EXPLORATION  1996  ? dx ibs  ? OVARIAN CYST SURGERY  age 75  ? x2 cyst  ? RADIOLOGY WITH ANESTHESIA N/A 06/29/2020  ? Procedure: IR WITH ANESTHESIA;  Surgeon: Radiologist, Medication, MD;  Location: [facility];  Service: Radiology;  Laterality: N/A;  ? ROBOTIC ASSISTED TOTAL HYSTERECTOMY N/A 12/13/2017  ? Procedure: XI ROBOTIC ASSISTED TOTAL HYSTERECTOMYWITH BILATERAL SALPINGECTOMY;  Surgeon: Christophe Louis, MD;  Location: [facility];  Service: Gynecology;  Laterality: N/A;  ? TUBAL LIGATION Bilateral 06-01-2002   dr Mancel Bale '@[facility]   ? PPTL  ? ? ?There were no vitals filed for this visit. ? ? Subjective Assessment - 06/30/21 1156   ? ? Subjective Pt reports she is doing ok. Thinks she may have overdid it yesterday with too much moving around. She was walking up and down the steps and doing a bunch of laundry and walking a lot. She just crashed after.   ? Patient is accompained by: Family member   ? Pertinent History medical  history of generalized arthritis and myalgias and concern for functional weakness, SLE, irritable bowel, Subarachnoid hemorrhage   ? Limitations Standing;Walking;Writing;House hold activities;Reading;Lifting   ? How long can you sit comfortably? no issues   ? How long can you stand comfortably? 2 min   ? How long can you walk comfortably? 5 min   ? Patient Stated Goals Improve independence   ? Currently in Pain? Yes   ? Pain Score 4    ? Pain Location Head   ? Pain Descriptors / Indicators Headache   ? Pain Type Chronic pain   ? Pain Onset More than a month ago   ? Pain Frequency Intermittent   ? Multiple Pain Sites Yes   ? Pain Score 3   ? Pain Location Arm   ? Pain Orientation Left   ? Pain Descriptors / Indicators Aching   ? Pain Type Chronic pain   ? Pain Onset More than a month ago   ? Pain  Frequency Constant   ? Aggravating Factors  movement   ? Pain Relieving Factors rest   ? ?  ?  ? ?  ? ? ? ? ? ? ? ? ? ? ? ? ? ? ? ? ? ? ? ? [facility] Adult PT Treatment/Exercise - 06/30/21 1158   ? ?  ? Transfers  ? Transfers Sit to Stand;Stand to Sit   ? Sit to Stand 5: Supervision;4: Min guard   ? Sit to Stand Details Verbal cues for technique   ? Stand to Sit 5: Supervision;4: Min guard   ? Stand to Sit Details (indicate cue type and reason) Verbal cues for technique   ? Comments Performed x 5 from edge of mat working on bowing forward and sticking bottom back then bending knee for descent CGA. More extraneous movements noted without UE support with sway backwards to heels.   ?  ? Ambulation/Gait  ? Ambulation/Gait Yes   ? Stairs Yes   ? Stairs Assistance 4: Min guard   ? Stairs Assistance Details (indicate cue type and reason) performed with 1 rail holding with both hands x 4 steps and then performed with cane on one side and rail on other. Needed cues for sequencing with cane. Pt ascends with LLE and descends with LLE   ? Stair Management Technique One rail Right;Step to pattern;With cane   ? Number of Stairs 8   ? Height of Stairs 6   ? Gait Comments At counter: trialed gait with 1 UE support starting with LUE x 8', then RUE per pt request x 8' then again with LUE x 8'. Pt struggled with balance and more extraneous movements noted and became anxious. CCGA/min assist from PT. Was easier on left but left arm started to hurt more. Pt then had trouble just walking with [place] after.   ? ?  ?  ? ?  ? ? ? ? ? ? ? ? ? ? ? ? PT Short Term Goals - 06/24/21 1544   ? ?  ? PT SHORT TERM GOAL #1  ? Title = LTG   ? ?  ?  ? ?  ? ? ? ? PT Long Term Goals - 06/24/21 1545   ? ?  ? PT LONG TERM GOAL #1  ? Title Pt will decrease TUG with cane to 60 seconds   ? Baseline 34.5 with RW; 2 minutes with cane   ? Time 8   ? Period Weeks   ? Status  Revised   ? Target Date 08/23/21   ?  ? PT LONG TERM GOAL #2  ? Title Pt will increase 6 min  walk distance to >350' with cane and RPE 12-13 for improved activity tolerance.   ? Baseline 554' with RW; not performed with cane yet   ? Time 8   ? Period Weeks   ? Status Revised   ? Target Date 08/23/21   ?  ? PT LONG TERM GOAL #3  ? Target Date 06/25/21   ?  ? PT LONG TERM GOAL #4  ? Title Pt will ambulate up/down 8 steps with one UE support on R railing; 8 stairs with one UE support on L railing with supervision   ? Baseline 8 stairs with one UE support on rail and min A (forwards)   ? Time 8   ? Period Weeks   ? Status Revised   ? Target Date 08/23/21   ? ?  ?  ? ?  ? ? ? ? ? ? ? ? Plan - 06/30/21 1414   ? ? Clinical Impression Statement PT performed steps with cane and one rail today and pt needed max cueing for sequencing with cane on steps but was CGA. Trialed 1 UE support walking along counter and pt had increased extraneous movements with increased anxiety. Did not attempt cane as pt unsafe at this time.   ? Personal Factors and Comorbidities Comorbidity 2;Past/Current Experience;Time since onset of injury/illness/exacerbation   ? Comorbidities medical history of generalized arthritis and myalgias and concern for functional weakness, SLE, irritable bowel,   ? Examination-Activity Limitations Bathing;Caring for Others;Carry;Dressing;Hygiene/Grooming;Lift;Locomotion Level;Reach Overhead;Squat;Stairs;Stand;Transfers   ? Examination-Participation Restrictions Church;Cleaning;Community Activity;Driving;Laundry;Medication Management;Meal Prep;Occupation;Shop;Yard Work;Personal Finances   ? Rehab Potential Good   ? PT Frequency 1x / week   ? PT Duration 8 weeks   ? PT Treatment/Interventions ADLs/Self Care Home Management;Cryotherapy;Moist Heat;Gait training;Stair training;Functional mobility training;Therapeutic activities;Therapeutic exercise;Balance training;Manual techniques;Orthotic Fit/Training;Patient/family education;Cognitive remediation;Neuromuscular re-education;Passive range of motion;Energy  conservation;Vestibular;Visual/perceptual remediation/compensation;DME Instruction   ? PT Next Visit Plan Stairs with one UE support and cane. Gait with RW as cane was not safe at this time. Could try to decrease UE

## 2021-07-02 ENCOUNTER — Telehealth: Payer: Self-pay | Admitting: *Deleted

## 2021-07-02 DIAGNOSIS — R4189 Other symptoms and signs involving cognitive functions and awareness: Secondary | ICD-10-CM

## 2021-07-02 MED ORDER — METHYLPHENIDATE HCL 5 MG PO TABS: 5.0000 mg | ORAL_TABLET | Freq: Two times a day (BID) | ORAL | 0 refills | Status: DC

## 2021-07-02 NOTE — Telephone Encounter (Signed)
Ritalin refilled

## 2021-07-02 NOTE — Telephone Encounter (Signed)
Amy Barry has requested refill on her ritalin. She has verified her pharmacy has enough medication to fill it for her. Please send it asap so they don't run out. ?

## 2021-07-08 ENCOUNTER — Ambulatory Visit: Payer: Self-pay | Attending: Family Medicine | Admitting: Physical Therapy

## 2021-07-08 DIAGNOSIS — M6281 Muscle weakness (generalized): Secondary | ICD-10-CM | POA: Insufficient documentation

## 2021-07-08 DIAGNOSIS — R279 Unspecified lack of coordination: Secondary | ICD-10-CM | POA: Insufficient documentation

## 2021-07-08 DIAGNOSIS — R2689 Other abnormalities of gait and mobility: Secondary | ICD-10-CM | POA: Insufficient documentation

## 2021-07-08 DIAGNOSIS — R2681 Unsteadiness on feet: Secondary | ICD-10-CM | POA: Insufficient documentation

## 2021-07-08 NOTE — Therapy (Signed)
[facility] ?[place] ?[place], [place], [address] ?Phone: [phone]   Fax:  [phone] ? ?Physical Therapy Treatment ? ?Patient Details  ?Name: Amy Barry ?MRN: [id number] ?Date of Birth: [birth year] ?Referring Provider (PT): Dr. Naaman Plummer ? ? ?Encounter Date: 07/08/2021 ? ? PT End of Session - 07/08/21 1151   ? ? Visit Number 14   ? Number of Visits 20   ? Date for PT Re-Evaluation 08/23/21   ? Authorization Type Aetna - through 09/01/21   ? PT Start Time 1150   ? PT Stop Time 1230   ? PT Time Calculation (min) 40 min   ? Activity Tolerance Patient tolerated treatment well   ? Behavior During Therapy [facility] for tasks assessed/performed   ? ?  ?  ? ?  ? ? ? ?Past Medical History:  ?Diagnosis Date  ? Anxiety   ? Arthralgia   ? Generalized weakness   ? bilateral upper and lower extremities,  walks with cane  ? GERD (gastroesophageal reflux disease)   ? Headache(784.0)   ? Heart murmur   ? per pt "since childhood, no problems"  ? History of kidney stones   ? Hypertension   ? followed pcp  ? IBS (irritable bowel syndrome)   ? Iron deficiency anemia   ? 12-06-2017 per pt recently had IV iron infusion July 2019  ? Low vitamin D level   ? Osteoarthritis   ? pt unsure about this  ? PVC (premature ventricular contraction)   ? per pt had holter monitor  ? SLE (systemic lupus erythematosus) ([place])   ? rheumotologist-- dr syed  ? Stroke [facility]) 06/2020  ? Wears glasses   ? ? ?Past Surgical History:  ?Procedure Laterality Date  ? CERVICAL DISC ARTHROPLASTY N/A 10/23/2015  ? Procedure: Cervical Disc Arthroplasty Cervical six-seven;  Surgeon: Eustace Moore, MD;  Location: [place] [facility];  Service: Neurosurgery;  Laterality: N/A;  ? D & C HYSTEROSCOPY W/ RESECTION ENDOMETRIAL POLYP  10-13-2003    dr rivard '@[facility]   ? ESOPHAGOGASTRODUODENOSCOPY    ? for GERD  ? IR ANGIO INTRA EXTRACRAN SEL COM CAROTID INNOMINATE BILAT MOD SED  07/06/2020  ? IR ANGIO INTRA EXTRACRAN SEL INTERNAL CAROTID  BILAT MOD SED  06/29/2020  ? IR ANGIO VERTEBRAL SEL VERTEBRAL BILAT MOD SED  06/29/2020  ? IR ANGIO VERTEBRAL SEL VERTEBRAL BILAT MOD SED  07/06/2020  ? LAPAROSCOPIC ABDOMINAL EXPLORATION  1996  ? dx ibs  ? OVARIAN CYST SURGERY  age 51  ? x2 cyst  ? RADIOLOGY WITH ANESTHESIA N/A 06/29/2020  ? Procedure: IR WITH ANESTHESIA;  Surgeon: Radiologist, Medication, MD;  Location: [place];  Service: Radiology;  Laterality: N/A;  ? ROBOTIC ASSISTED TOTAL HYSTERECTOMY N/A 12/13/2017  ? Procedure: XI ROBOTIC ASSISTED TOTAL HYSTERECTOMYWITH BILATERAL SALPINGECTOMY;  Surgeon: Christophe Louis, MD;  Location: [facility];  Service: Gynecology;  Laterality: N/A;  ? TUBAL LIGATION Bilateral 06-01-2002   dr Mancel Bale '@[facility]   ? PPTL  ? ? ?There were no vitals filed for this visit. ? ? Subjective Assessment - 07/08/21 1152   ? ? Subjective Nothing new to report; still trying to do more activity at home but also finding a balance between moving around and resting so she doesn't crash.  Still tolerating the Ritalin.   ? Patient is accompained by: Family member   ? Pertinent History medical history of generalized arthritis and myalgias and concern for functional weakness, SLE, irritable bowel, Subarachnoid hemorrhage   ?  Limitations Standing;Walking;Writing;House hold activities;Reading;Lifting   ? How long can you sit comfortably? no issues   ? How long can you stand comfortably? 2 min   ? How long can you walk comfortably? 5 min   ? Patient Stated Goals Improve independence   ? Currently in Pain? No/denies   ? Pain Onset More than a month ago   ? ?  ?  ? ?  ? ? ? Balance Exercises - 07/08/21 1206   ? ?  ? Balance Exercises: Standing  ? Tandem Gait Forward;Upper extremity support;3 reps;Limitations   ? Tandem Gait Limitations in // bars with first two laps holding // bars; third set pt cued to hold each step for 3-4 seconds and lifting hands off bars - PT provided min A when releasing bars.  Supervision when holding bars   ? Sidestepping Upper extremity  support;4 reps;Limitations   ? Sidestepping Limitations to L and R with bilat UE support on // bar and using low obstacle to facilitate increased foot clearance, step width and weight shifting.  Performed 2 sets each direction.  Pt reports greater difficulty weight shifting to R and initiating stepping over with LLE.   ? Step Over Hurdles / Cones Static standing performed blocked practice step overs low obstacle laterally with LLE x 6 reps and then RLE x 6 reps with focus on more upright posture, decreased UE support on rails to finger tip support.  Pt demonstrated improved grading of movement when advancing LLE out and back over obstacle.   ? ?  ?  ? ?  ? ? ? ? PT Short Term Goals - 06/24/21 1544   ? ?  ? PT SHORT TERM GOAL #1  ? Title = LTG   ? ?  ?  ? ?  ? ? ? ? PT Long Term Goals - 06/24/21 1545   ? ?  ? PT LONG TERM GOAL #1  ? Title Pt will decrease TUG with cane to 60 seconds   ? Baseline 34.5 with RW; 2 minutes with cane   ? Time 8   ? Period Weeks   ? Status Revised   ? Target Date 08/23/21   ?  ? PT LONG TERM GOAL #2  ? Title Pt will increase 6 min walk distance to >350' with cane and RPE 12-13 for improved activity tolerance.   ? Baseline 554' with RW; not performed with cane yet   ? Time 8   ? Period Weeks   ? Status Revised   ? Target Date 08/23/21   ?  ? PT LONG TERM GOAL #3  ? Target Date 06/25/21   ?  ? PT LONG TERM GOAL #4  ? Title Pt will ambulate up/down 8 steps with one UE support on R railing; 8 stairs with one UE support on L railing with supervision   ? Baseline 8 stairs with one UE support on rail and min A (forwards)   ? Time 8   ? Period Weeks   ? Status Revised   ? Target Date 08/23/21   ? ?  ?  ? ?  ? ? ? ? ? ? ? ? Plan - 07/08/21 1331   ? ? Clinical Impression Statement Treatment session focused on dynamic standing balance activities to facilitate increased grading of movement, weight shifting, static and dynamic balance with decreased UE support to progress towards pt's goal of using  cane.  Pt required increased time and encouragement to decrease amount of  UE support and to initiate LE advancement when standing with decreased UE support.   ? Personal Factors and Comorbidities Comorbidity 2;Past/Current Experience;Time since onset of injury/illness/exacerbation   ? Comorbidities medical history of generalized arthritis and myalgias and concern for functional weakness, SLE, irritable bowel,   ? Examination-Activity Limitations Bathing;Caring for Others;Carry;Dressing;Hygiene/Grooming;Lift;Locomotion Level;Reach Overhead;Squat;Stairs;Stand;Transfers   ? Examination-Participation Restrictions Church;Cleaning;Community Activity;Driving;Laundry;Medication Management;Meal Prep;Occupation;Shop;Yard Work;Personal Finances   ? Rehab Potential Good   ? PT Frequency 1x / week   ? PT Duration 8 weeks   ? PT Treatment/Interventions ADLs/Self Care Home Management;Cryotherapy;Moist Heat;Gait training;Stair training;Functional mobility training;Therapeutic activities;Therapeutic exercise;Balance training;Manual techniques;Orthotic Fit/Training;Patient/family education;Cognitive remediation;Neuromuscular re-education;Passive range of motion;Energy conservation;Vestibular;Visual/perceptual remediation/compensation;DME Instruction   ? PT Next Visit Plan Stairs with one UE support and cane. Gait with RW as cane was not safe at this time. Could try to decrease UE reliance.  standing balance with decreased UE support. Focus on functional activities as extraneous movements increase when working on just balance. Weight shifting forwards during sit > stand. Big focus will be on trying to increase aerobic activity. Continue NuStep for some aerobic activity and strengthening.   ? PT Home Exercise Plan [email or web address] - therapist could not find code to previous HEP addressed in [place].   ? Consulted and Agree with Plan of Care Patient;Family member/caregiver   ? Family Member Consulted daughter   ? ?  ?  ? ?  ? ? ? ?Patient will  benefit from skilled therapeutic intervention in order to improve the following deficits and impairments:  Abnormal gait, Decreased activity tolerance, Decreased balance, Decreased cognition, Decreased safety awa

## 2021-07-12 ENCOUNTER — Other Ambulatory Visit: Payer: Self-pay | Admitting: Physical Medicine & Rehabilitation

## 2021-07-12 DIAGNOSIS — G441 Vascular headache, not elsewhere classified: Secondary | ICD-10-CM

## 2021-07-15 ENCOUNTER — Encounter: Payer: Self-pay | Admitting: Physical Therapy

## 2021-07-15 ENCOUNTER — Ambulatory Visit: Payer: Self-pay | Admitting: Physical Therapy

## 2021-07-15 DIAGNOSIS — M6281 Muscle weakness (generalized): Secondary | ICD-10-CM

## 2021-07-15 DIAGNOSIS — R279 Unspecified lack of coordination: Secondary | ICD-10-CM

## 2021-07-15 DIAGNOSIS — R2681 Unsteadiness on feet: Secondary | ICD-10-CM

## 2021-07-15 DIAGNOSIS — R2689 Other abnormalities of gait and mobility: Secondary | ICD-10-CM

## 2021-07-15 NOTE — Therapy (Signed)
[facility] ?[place] ?[place], [place], [address] ?Phone: [phone]   Fax:  [phone] ? ?Physical Therapy Treatment ? ?Patient Details  ?Name: Amy Barry ?MRN: [id number] ?Date of Birth: June 25, 1970 ?Referring Provider (PT): Dr. Naaman Plummer ? ? ?Encounter Date: 07/15/2021 ? ? PT End of Session - 07/15/21 1406   ? ? Visit Number 15   ? Number of Visits 20   ? Date for PT Re-Evaluation 08/23/21   ? Authorization Type Aetna - through 09/01/21   ? PT Start Time 7494   ? PT Stop Time 4967   ? PT Time Calculation (min) 39 min   ? Equipment Utilized During Treatment Gait belt   ? Activity Tolerance Patient tolerated treatment well   ? Behavior During Therapy [facility] for tasks assessed/performed   ? ?  ?  ? ?  ? ? ? ?Past Medical History:  ?Diagnosis Date  ? Anxiety   ? Arthralgia   ? Generalized weakness   ? bilateral upper and lower extremities,  walks with cane  ? GERD (gastroesophageal reflux disease)   ? Headache(784.0)   ? Heart murmur   ? per pt "since childhood, no problems"  ? History of kidney stones   ? Hypertension   ? followed pcp  ? IBS (irritable bowel syndrome)   ? Iron deficiency anemia   ? 12-06-2017 per pt recently had IV iron infusion July 2019  ? Low vitamin D level   ? Osteoarthritis   ? pt unsure about this  ? PVC (premature ventricular contraction)   ? per pt had holter monitor  ? SLE (systemic lupus erythematosus) ([place])   ? rheumotologist-- dr syed  ? Stroke [facility]) 06/2020  ? Wears glasses   ? ? ?Past Surgical History:  ?Procedure Laterality Date  ? CERVICAL DISC ARTHROPLASTY N/A 10/23/2015  ? Procedure: Cervical Disc Arthroplasty Cervical six-seven;  Surgeon: Eustace Moore, MD;  Location: [place] [facility];  Service: Neurosurgery;  Laterality: N/A;  ? D & C HYSTEROSCOPY W/ RESECTION ENDOMETRIAL POLYP  10-13-2003    dr rivard '@[facility]   ? ESOPHAGOGASTRODUODENOSCOPY    ? for GERD  ? IR ANGIO INTRA EXTRACRAN SEL COM CAROTID INNOMINATE BILAT MOD SED   07/06/2020  ? IR ANGIO INTRA EXTRACRAN SEL INTERNAL CAROTID BILAT MOD SED  06/29/2020  ? IR ANGIO VERTEBRAL SEL VERTEBRAL BILAT MOD SED  06/29/2020  ? IR ANGIO VERTEBRAL SEL VERTEBRAL BILAT MOD SED  07/06/2020  ? LAPAROSCOPIC ABDOMINAL EXPLORATION  1996  ? dx ibs  ? OVARIAN CYST SURGERY  age 89  ? x2 cyst  ? RADIOLOGY WITH ANESTHESIA N/A 06/29/2020  ? Procedure: IR WITH ANESTHESIA;  Surgeon: Radiologist, Medication, MD;  Location: [facility];  Service: Radiology;  Laterality: N/A;  ? ROBOTIC ASSISTED TOTAL HYSTERECTOMY N/A 12/13/2017  ? Procedure: XI ROBOTIC ASSISTED TOTAL HYSTERECTOMYWITH BILATERAL SALPINGECTOMY;  Surgeon: Christophe Louis, MD;  [facility];  Service: Gynecology;  Laterality: N/A;  ? TUBAL LIGATION Bilateral 06-01-2002   dr Mancel Bale '@[facility]   ? PPTL  ? ? ?There were no vitals filed for this visit. ? ?SUBJECTIVE: ?"I've been moving around a little bit more, I got outside a little bit and found out heat don't work out real good for me."  She had one of her episodes where everything shut down and she had to come inside.  She felt paralyzed.  She hasn't been in much pain at all. ? ?PAIN: ?Yes ?4/10 ?L shoulder ?Soreness, constant ? ?TODAY'S  TREATMENT: ?// bars:  Warmed up w 3 bouts x8' forward walking, 3 bouts x8' backwards walking SBA-CGA; standing w/ alt UE support and alt UE reach over head, cued to allow truncal ataxia to dec between reps to allow quality movement and inc safety; pt ambulates 4' using RUE support; side stepping x8' cued for hips forward and upright trunk and paced breathing between steps ?At countertop:  Pt performs bimanual unstacking of cones with alt UE placement on low shelf, retrieval of low shelf and return to countertop with RUE used for placing on high shelf and removing, pt uses anterior hip support from counter to limit truncal ataxia to restack cones using BUE.  No truncal ataxia during first half of task w/ dec UE support. ?Pt standing at mat performing functional reach outside BOS to  chair placed in front of RW, use of alt UE on RW with alt UE taps, pt performs taps to cone with progressively increased distance from target.   ?CGA-minA used for intermittent LOB during standing activity. ? ? ? ? ? PT Short Term Goals - 06/24/21 1544   ? ?  ? PT SHORT TERM GOAL #1  ? Title = LTG   ? ?  ?  ? ?  ? ? ? ? PT Long Term Goals - 06/24/21 1545   ? ?  ? PT LONG TERM GOAL #1  ? Title Pt will decrease TUG with cane to 60 seconds   ? Baseline 34.5 with RW; 2 minutes with cane   ? Time 8   ? Period Weeks   ? Status Revised   ? Target Date 08/23/21   ?  ? PT LONG TERM GOAL #2  ? Title Pt will increase 6 min walk distance to >350' with cane and RPE 12-13 for improved activity tolerance.   ? Baseline 554' with RW; not performed with cane yet   ? Time 8   ? Period Weeks   ? Status Revised   ? Target Date 08/23/21   ?  ? PT LONG TERM GOAL #3  ? Target Date 06/25/21   ?  ? PT LONG TERM GOAL #4  ? Title Pt will ambulate up/down 8 steps with one UE support on R railing; 8 stairs with one UE support on L railing with supervision   ? Baseline 8 stairs with one UE support on rail and min A (forwards)   ? Time 8   ? Period Weeks   ? Status Revised   ? Target Date 08/23/21   ? ?  ?  ? ?  ? ? ? ? ? ? ? ? Plan - 07/15/21 1444   ? ? Clinical Impression Statement Session focused on functional balance tasks in standing with strategies to manage truncal ataxia.  Pt demonstrates dec ataxia at onset of activities, but inc with fatigue.  She remains challenged by fear of movement and anxiety in regards to dynamica balance tasks.  Continue per POC.   ? Personal Factors and Comorbidities Comorbidity 2;Past/Current Experience;Time since onset of injury/illness/exacerbation   ? Comorbidities medical history of generalized arthritis and myalgias and concern for functional weakness, SLE, irritable bowel,   ? Examination-Activity Limitations Bathing;Caring for Others;Carry;Dressing;Hygiene/Grooming;Lift;Locomotion Level;Reach  Overhead;Squat;Stairs;Stand;Transfers   ? Examination-Participation Restrictions Church;Cleaning;Community Activity;Driving;Laundry;Medication Management;Meal Prep;Occupation;Shop;Yard Work;Personal Finances   ? Rehab Potential Good   ? PT Frequency 1x / week   ? PT Duration 8 weeks   ? PT Treatment/Interventions ADLs/Self Care Home Management;Cryotherapy;Moist Heat;Gait training;Stair training;Functional mobility training;Therapeutic  activities;Therapeutic exercise;Balance training;Manual techniques;Orthotic Fit/Training;Patient/family education;Cognitive remediation;Neuromuscular re-education;Passive range of motion;Energy conservation;Vestibular;Visual/perceptual remediation/compensation;DME Instruction   ? PT Next Visit Plan Stairs with one UE support and cane. Gait with RW as cane was not safe at this time. Could try to decrease UE reliance.  standing balance with decreased UE support. Focus on functional activities as extraneous movements increase when working on just balance. Weight shifting forwards during sit > stand. Big focus will be on trying to increase aerobic activity. Continue NuStep for some aerobic activity and strengthening.   ? PT Home Exercise Plan [email or web address] - therapist could not find code to previous HEP addressed in [place].   ? Consulted and Agree with Plan of Care Patient;Family member/caregiver   ? Family Member Consulted daughter   ? ?  ?  ? ?  ? ? ? ? ?Patient will benefit from skilled therapeutic intervention in order to improve the following deficits and impairments:  Abnormal gait, Decreased activity tolerance, Decreased balance, Decreased cognition, Decreased safety awareness, Decreased range of motion, Decreased mobility, Decreased endurance, Decreased strength, Difficulty walking, Dizziness, Impaired flexibility, Increased fascial restricitons, Hypomobility, Impaired tone, Impaired UE functional use, Improper body mechanics, Postural dysfunction, Pain ? ?Visit Diagnosis: ?Other  abnormalities of gait and mobility ? ?Muscle weakness (generalized) ? ?Unsteadiness on feet ? ?Lack of coordination ? ? ? ? ?Problem List ?Patient Active Problem List  ? Diagnosis Date Noted  ? Generalized anxiety disorder 02/01/2

## 2021-07-22 ENCOUNTER — Ambulatory Visit: Payer: Self-pay | Admitting: Physical Therapy

## 2021-07-22 ENCOUNTER — Encounter: Payer: Self-pay | Admitting: Physical Therapy

## 2021-07-22 DIAGNOSIS — M6281 Muscle weakness (generalized): Secondary | ICD-10-CM

## 2021-07-22 DIAGNOSIS — R279 Unspecified lack of coordination: Secondary | ICD-10-CM

## 2021-07-22 DIAGNOSIS — R2681 Unsteadiness on feet: Secondary | ICD-10-CM

## 2021-07-22 DIAGNOSIS — R2689 Other abnormalities of gait and mobility: Secondary | ICD-10-CM

## 2021-07-22 NOTE — Therapy (Signed)
[facility] ?[place] ?[place], [place], [address] ?Phone: [phone]   Fax:  [phone] ? ?Physical Therapy Treatment ? ?Patient Details  ?Name: Amy Barry ?MRN: [id number] ?Date of Birth: 08/03/2020 ?Referring Provider (PT): Dr. Naaman Plummer ? ? ?Encounter Date: 07/22/2021 ? ? PT End of Session - 07/22/21 1019   ? ? Visit Number 16   ? Number of Visits 20   ? Date for PT Re-Evaluation 08/23/21   ? Authorization Type [employer]na - through 09/01/21   ? PT Start Time 1016   ? PT Stop Time 1101   ? PT Time Calculation (min) 45 min   ? Equipment Utilized During Treatment Gait belt   ? Activity Tolerance Patient tolerated treatment well   ? Behavior During Therapy [facility] for tasks assessed/performed   ? ?  ?  ? ?  ? ? ? ?Past Medical History:  ?Diagnosis Date  ? Anxiety   ? Arthralgia   ? Generalized weakness   ? bilateral upper and lower extremities,  walks with cane  ? GERD (gastroesophageal reflux disease)   ? Headache(784.0)   ? Heart murmur   ? per pt "since childhood, no problems"  ? History of kidney stones   ? Hypertension   ? followed pcp  ? IBS (irritable bowel syndrome)   ? Iron deficiency anemia   ? 12-06-2017 per pt recently had IV iron infusion July 2019  ? Low vitamin D level   ? Osteoarthritis   ? pt unsure about this  ? PVC (premature ventricular contraction)   ? per pt had holter monitor  ? SLE (systemic lupus erythematosus) ([place])   ? rheumotologist-- dr syed  ? Stroke [facility]) 06/2020  ? Wears glasses   ? ? ?Past Surgical History:  ?Procedure Laterality Date  ? CERVICAL DISC ARTHROPLASTY N/A 10/23/2015  ? Procedure: Cervical Disc Arthroplasty Cervical six-seven;  Surgeon: Eustace Moore, MD;  Location: [place] [facility];  Service: Neurosurgery;  Laterality: N/A;  ? D & C HYSTEROSCOPY W/ RESECTION ENDOMETRIAL POLYP  10-13-2003    dr rivard '@[facility]   ? ESOPHAGOGASTRODUODENOSCOPY    ? for GERD  ? IR ANGIO INTRA EXTRACRAN SEL COM CAROTID INNOMINATE BILAT MOD SED   07/06/2020  ? IR ANGIO INTRA EXTRACRAN SEL INTERNAL CAROTID BILAT MOD SED  06/29/2020  ? IR ANGIO VERTEBRAL SEL VERTEBRAL BILAT MOD SED  06/29/2020  ? IR ANGIO VERTEBRAL SEL VERTEBRAL BILAT MOD SED  07/06/2020  ? LAPAROSCOPIC ABDOMINAL EXPLORATION  1996  ? dx ibs  ? OVARIAN CYST SURGERY  age 51  ? x2 cyst  ? RADIOLOGY WITH ANESTHESIA N/A 06/29/2020  ? Procedure: IR WITH ANESTHESIA;  Surgeon: Radiologist, Medication, MD;  Location: [place];  Service: Radiology;  Laterality: N/A;  ? ROBOTIC ASSISTED TOTAL HYSTERECTOMY N/A 12/13/2017  ? Procedure: XI ROBOTIC ASSISTED TOTAL HYSTERECTOMYWITH BILATERAL SALPINGECTOMY;  Surgeon: Christophe Louis, MD;  [facility];  Service: Gynecology;  Laterality: N/A;  ? TUBAL LIGATION Bilateral 06-01-2002   dr Mancel Bale '@[facility]   ? PPTL  ? ? ?VITALS: ?RUE in sitting prior to activity:  124/68 ? ?SUBJECTIVE: ?She feels nauseous today as she frequently does, she attributes this to not eating enough with her nighttime meds.  She has thrown up today, but states she ate afterwards and took her morning meds.  No falls.   ? ?PAIN: ?No ? ?TODAY'S TREATMENT: ?Stair step ups to 6" step using BUE on rails w/ cuing for upright trunk to prevent inc  UE use with forward trunk lean x20>R quad tip cane and L rail step ups x3 requiring CGA-minA for posterior support, pt crosses midline when stepping backwards off step ?STS using forward reach to back of chair x6 cued to dec pull on chair and to sequence movement for glut engagement ?Lateral reaching in sitting w/ therapist provided weight shift x10 each side>4" step under feet to promote stability and dec need for therapist assist x10 each side ?Forward reaching to cone on floor using BUE x6 w/ inc time to return to upright sitting, min cues for core engagement ?Pt stands with RUE on countertop completing forward and retro stepping requiring CGA due to truncal ataxia, cued pt to put left hand on hip with decreased sway noted. ? ? ? ? ? ? ? PT Short Term Goals - 06/24/21  1544   ? ?  ? PT SHORT TERM GOAL #1  ? Title = LTG   ? ?  ?  ? ?  ? ? ? ? PT Long Term Goals - 06/24/21 1545   ? ?  ? PT LONG TERM GOAL #1  ? Title Pt will decrease TUG with cane to 60 seconds   ? Baseline 34.5 with RW; 2 minutes with cane   ? Time 8   ? Period Weeks   ? Status Revised   ? Target Date 08/23/21   ?  ? PT LONG TERM GOAL #2  ? Title Pt will increase 6 min walk distance to >350' with cane and RPE 12-13 for improved activity tolerance.   ? Baseline 554' with RW; not performed with cane yet   ? Time 8   ? Period Weeks   ? Status Revised   ? Target Date 08/23/21   ?  ? PT LONG TERM GOAL #3  ? Target Date 06/25/21   ?  ? PT LONG TERM GOAL #4  ? Title Pt will ambulate up/down 8 steps with one UE support on R railing; 8 stairs with one UE support on L railing with supervision   ? Baseline 8 stairs with one UE support on rail and min A (forwards)   ? Time 8   ? Period Weeks   ? Status Revised   ? Target Date 08/23/21   ? ?  ?  ? ?  ? ? ? ? ? ? ? ? Plan - 07/15/21 1444   ? ? Clinical Impression Statement Focused on engaging core to forward lean during STS and sitting balance tasks.  Assessed stair step up using cane and L rail with pt requiring inc guarding to promote safety.  Reinforced that cane is not a good option at this time.  Continued working on decreased UE support to promote improved standing balance.  Continue per POC.  ? Personal Factors and Comorbidities Comorbidity 2;Past/Current Experience;Time since onset of injury/illness/exacerbation   ? Comorbidities medical history of generalized arthritis and myalgias and concern for functional weakness, SLE, irritable bowel,   ? Examination-Activity Limitations Bathing;Caring for Others;Carry;Dressing;Hygiene/Grooming;Lift;Locomotion Level;Reach Overhead;Squat;Stairs;Stand;Transfers   ? Examination-Participation Restrictions Church;Cleaning;Community Activity;Driving;Laundry;Medication Management;Meal Prep;Occupation;Shop;Yard Work;Personal Finances   ?  Rehab Potential Good   ? PT Frequency 1x / week   ? PT Duration 8 weeks   ? PT Treatment/Interventions ADLs/Self Care Home Management;Cryotherapy;Moist Heat;Gait training;Stair training;Functional mobility training;Therapeutic activities;Therapeutic exercise;Balance training;Manual techniques;Orthotic Fit/Training;Patient/family education;Cognitive remediation;Neuromuscular re-education;Passive range of motion;Energy conservation;Vestibular;Visual/perceptual remediation/compensation;DME Instruction   ? PT Next Visit Plan Gait with RW as cane was not safe at this time. standing balance  with decreased UE support-try bean bag toss in standing, can trial reach across midline to retrieve. Focus on functional activities as extraneous movements increase when working on just balance. Weight shifting forwards during sit > stand. Big focus will be on trying to increase aerobic activity. Continue NuStep for some aerobic activity and strengthening.   ? PT Home Exercise Plan [email or web address] - therapist could not find code to previous HEP addressed in [place].   ? Consulted and Agree with Plan of Care Patient;Family member/caregiver   ? Family Member Consulted daughter   ? ?  ?  ? ?  ? ? ? ? ?Patient will benefit from skilled therapeutic intervention in order to improve the following deficits and impairments:    ? ?Visit Diagnosis: ?Other abnormalities of gait and mobility ? ?Muscle weakness (generalized) ? ?Unsteadiness on feet ? ?Lack of coordination ? ? ? ? ?Problem List ?Patient Active Problem List  ? Diagnosis Date Noted  ? Generalized anxiety disorder 05/05/2021  ? Biceps tendonitis on left 02/10/2021  ? Vascular headache 08/05/2020  ? Hyponatremia 07/28/2020  ? Headache 07/24/2020  ? Neck pain, musculoskeletal 07/24/2020  ? Cognitive deficits   ? Frontal lobe and executive function deficit   ? Subarachnoid bleed ([place]) 07/09/2020  ? Subarachnoid hemorrhage ([place]) 06/29/2020  ? 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 S/P hysterectomy  12/13/2017  ? Abnormal laboratory test 09/09/2016  ? Gait abnormality 08/03/2016  ? Weakness 08/03/2016  ? Paresthesia 08/03/2016  ? S/P cervical spinal fusion 10/23/2015  ? Hypersomnia 12/08/2010  ? ANGIONEUROTIC EDEMA

## 2021-07-27 ENCOUNTER — Other Ambulatory Visit: Payer: Self-pay | Admitting: Physical Medicine & Rehabilitation

## 2021-07-27 DIAGNOSIS — I609 Nontraumatic subarachnoid hemorrhage, unspecified: Secondary | ICD-10-CM

## 2021-07-29 ENCOUNTER — Encounter: Payer: Self-pay | Admitting: Physical Therapy

## 2021-07-29 ENCOUNTER — Ambulatory Visit: Payer: Self-pay | Admitting: Physical Therapy

## 2021-07-29 DIAGNOSIS — R2681 Unsteadiness on feet: Secondary | ICD-10-CM

## 2021-07-29 DIAGNOSIS — R279 Unspecified lack of coordination: Secondary | ICD-10-CM

## 2021-07-29 DIAGNOSIS — R2689 Other abnormalities of gait and mobility: Secondary | ICD-10-CM

## 2021-07-29 DIAGNOSIS — M6281 Muscle weakness (generalized): Secondary | ICD-10-CM

## 2021-07-29 NOTE — Therapy (Signed)
[facility] ?[place] ?[place], [place], [address] ?Phone: [phone]   Fax:  [phone] ? ?Physical Therapy Treatment ? ?Patient Details  ?Name: Amy Barry ?MRN: [id number] ?Date of Birth: [birth year] ?Referring Provider (PT): Dr. Naaman Plummer ? ? ?Encounter Date: 07/29/2021 ? ? PT End of Session - 07/29/21 1325   ? ? Visit Number 17   ? Number of Visits 20   ? Date for PT Re-Evaluation 08/23/21   ? Authorization Type Aetna - through 09/01/21   ? PT Start Time 1319   ? PT Stop Time 1400   ? PT Time Calculation (min) 41 min   ? Equipment Utilized During Treatment Gait belt   ? Activity Tolerance Patient tolerated treatment well   ? Behavior During Therapy [facility] for tasks assessed/performed   ? ?  ?  ? ?  ? ? ? ?Past Medical History:  ?Diagnosis Date  ? Anxiety   ? Arthralgia   ? Generalized weakness   ? bilateral upper and lower extremities,  walks with cane  ? GERD (gastroesophageal reflux disease)   ? Headache(784.0)   ? Heart murmur   ? per pt "since childhood, no problems"  ? History of kidney stones   ? Hypertension   ? followed pcp  ? IBS (irritable bowel syndrome)   ? Iron deficiency anemia   ? 12-06-2017 per pt recently had IV iron infusion July 2019  ? Low vitamin D level   ? Osteoarthritis   ? pt unsure about this  ? PVC (premature ventricular contraction)   ? per pt had holter monitor  ? SLE (systemic lupus erythematosus) ([place])   ? rheumotologist-- dr syed  ? Stroke [facility]) 06/2020  ? Wears glasses   ? ? ?Past Surgical History:  ?Procedure Laterality Date  ? CERVICAL DISC ARTHROPLASTY N/A 10/23/2015  ? Procedure: Cervical Disc Arthroplasty Cervical six-seven;  Surgeon: Eustace Moore, MD;  Location: [place] NEUR[facility]S;  Service: Neurosurgery;  Laterality: N/A;  ? D & C HYSTEROSCOPY W/ RESECTION ENDOMETRIAL POLYP  10-13-2003    dr rivard '@[facility]   ? ESOPHAGOGASTRODUODENOSCOPY    ? for GERD  ? IR ANGIO INTRA EXTRACRAN SEL COM CAROTID INNOMINATE BILAT MOD SED   07/06/2020  ? IR ANGIO INTRA EXTRACRAN SEL INTERNAL CAROTID BILAT MOD SED  06/29/2020  ? IR ANGIO VERTEBRAL SEL VERTEBRAL BILAT MOD SED  06/29/2020  ? IR ANGIO VERTEBRAL SEL VERTEBRAL BILAT MOD SED  07/06/2020  ? LAPAROSCOPIC ABDOMINAL EXPLORATION  1996  ? dx ibs  ? OVARIAN CYST SURGERY  age 51  ? x2 cyst  ? RADIOLOGY WITH ANESTHESIA N/A 06/29/2020  ? Procedure: IR WITH ANESTHESIA;  Surgeon: Radiologist, Medication, MD;  Location: [place];  Service: Radiology;  Laterality: N/A;  ? ROBOTIC ASSISTED TOTAL HYSTERECTOMY N/A 12/13/2017  ? Procedure: XI ROBOTIC ASSISTED TOTAL HYSTERECTOMYWITH BILATERAL SALPINGECTOMY;  Surgeon: Christophe Louis, MD;  [facility];  Service: Gynecology;  Laterality: N/A;  ? TUBAL LIGATION Bilateral 06-01-2002   dr Mancel Bale '@[facility]   ? PPTL  ? ? ?VITALS: ?RUE in sitting prior to activity:  124/68 ? ?SUBJECTIVE: ?She spent yesterday outside with daughter while daughter planted vegetables.  She has a personal goal of increasing her outdoor time.  She is disappointed because she wanted to be able to hold the bags of seeds and help plant them.   Her nausea is improving. ? ?PAIN: ?No ? ?TODAY'S TREATMENT: ?Time spent adjusting pt [place] as it is not her regular  [place].  Provided additional tennis balls. ? ?NuStep L2x56mn>L3x4min>L2x2min (8 min total) using BUE/BLE with step goal of 30 steps/min, achieved 34 steps/min avg.  Performed for dynamic warmup and reciprocal movement. ?Gait training w/ dual task, pt walks PT through hair routine during ambulation to discuss difficulties she continues to have with ADLs and fine motor control as well as problem solving routine, she ambulates 575' w/ RW level surface supervision level, she ambulates with dec speed requiring inc time to maintain task, even cadence and no truncal ataxia noted. ? ? ?EDUCATION: ?PT walks pt through modifications to gardening so that she can work towards assisting daughter once raised beds are built.  Edu on raised flower beds and using seat to  hold pot in lap and use small shovel to place soil as this is similar to reaching tasks completed in therapy in sessions prior.   ?Patient and daughter educated.   ?Verbalized Understanding. ? ? ? ? ? ? ? PT Short Term Goals - 06/24/21 1544   ? ?  ? PT SHORT TERM GOAL #1  ? Title = LTG   ? ?  ?  ? ?  ? ? ? ? PT Long Term Goals - 06/24/21 1545   ? ?  ? PT LONG TERM GOAL #1  ? Title Pt will decrease TUG with cane to 60 seconds   ? Baseline 34.5 with RW; 2 minutes with cane   ? Time 8   ? Period Weeks   ? Status Revised   ? Target Date 08/23/21   ?  ? PT LONG TERM GOAL #2  ? Title Pt will increase 6 min walk distance to >350' with cane and RPE 12-13 for improved activity tolerance.   ? Baseline 554' with RW; not performed with cane yet   ? Time 8   ? Period Weeks   ? Status Revised   ? Target Date 08/23/21   ?  ? PT LONG TERM GOAL #3  ? Target Date 06/25/21   ?  ? PT LONG TERM GOAL #4  ? Title Pt will ambulate up/down 8 steps with one UE support on R railing; 8 stairs with one UE support on L railing with supervision   ? Baseline 8 stairs with one UE support on rail and min A (forwards)   ? Time 8   ? Period Weeks   ? Status Revised   ? Target Date 08/23/21   ? ?  ?  ? ?  ? ? ? ? ? ? ? ? Plan - 07/15/21 1444   ? ? Clinical Impression Statement Session focused on endurance with tasks in seated and standing.  Portion of session used to discuss personal goals of increased outdoor activity with modifications based on prior tasks performed in therapy.  Continue per POC.  ? Personal Factors and Comorbidities Comorbidity 2;Past/Current Experience;Time since onset of injury/illness/exacerbation   ? Comorbidities medical history of generalized arthritis and myalgias and concern for functional weakness, SLE, irritable bowel,   ? Examination-Activity Limitations Bathing;Caring for Others;Carry;Dressing;Hygiene/Grooming;Lift;Locomotion Level;Reach Overhead;Squat;Stairs;Stand;Transfers   ? Examination-Participation Restrictions  Church;Cleaning;Community Activity;Driving;Laundry;Medication Management;Meal Prep;Occupation;Shop;Yard Work;Personal Finances   ? Rehab Potential Good   ? PT Frequency 1x / week   ? PT Duration 8 weeks   ? PT Treatment/Interventions ADLs/Self Care Home Management;Cryotherapy;Moist Heat;Gait training;Stair training;Functional mobility training;Therapeutic activities;Therapeutic exercise;Balance training;Manual techniques;Orthotic Fit/Training;Patient/family education;Cognitive remediation;Neuromuscular re-education;Passive range of motion;Energy conservation;Vestibular;Visual/perceptual remediation/compensation;DME Instruction   ? PT Next Visit Plan Gait with RW as cane was not  safe at this time. standing balance with decreased UE support-try bean bag toss in standing, can trial reach across midline to retrieve. Focus on functional activities as extraneous movements increase when working on just balance. Weight shifting forwards during sit > stand. Big focus will be on trying to increase aerobic activity. Continue NuStep for some aerobic activity and strengthening.   ? PT Home Exercise Plan [email or web address] - therapist could not find code to previous HEP addressed in [place].   ? Consulted and Agree with Plan of Care Patient;Family member/caregiver   ? Family Member Consulted daughter   ? ?  ?  ? ?  ? ? ? ? ?Patient will benefit from skilled therapeutic intervention in order to improve the following deficits and impairments:    ? ?Visit Diagnosis: ?Other abnormalities of gait and mobility ? ?Muscle weakness (generalized) ? ?Unsteadiness on feet ? ?Lack of coordination ? ? ? ? ?Problem List ?Patient Active Problem List  ? Diagnosis Date Noted  ? Generalized anxiety disorder 05/05/2021  ? Biceps tendonitis on left 02/10/2021  ? Vascular headache 08/05/2020  ? Hyponatremia 07/28/2020  ? Headache 07/24/2020  ? Neck pain, musculoskeletal 07/24/2020  ? Cognitive deficits   ? Frontal lobe and executive function deficit   ?  Subarachnoid bleed ([place]) 07/09/2020  ? Subarachnoid hemorrhage ([place]) 06/29/2020  ? 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 S/P hysterectomy 12/13/2017  ? Abnormal laboratory test 09/09/2016  ? Gait abnormality 08/03/2016  ? [place]

## 2021-08-04 ENCOUNTER — Other Ambulatory Visit: Payer: Self-pay

## 2021-08-04 DIAGNOSIS — R4189 Other symptoms and signs involving cognitive functions and awareness: Secondary | ICD-10-CM

## 2021-08-04 MED ORDER — METHYLPHENIDATE HCL 5 MG PO TABS: 5.0000 mg | ORAL_TABLET | Freq: Two times a day (BID) | ORAL | 0 refills | Status: DC

## 2021-08-04 NOTE — Telephone Encounter (Signed)
Filled  Written  ID  Drug  QTY  Days  Prescriber  RX #  Dispenser  Refill  Daily Dose*  Pymt Type  PMP  ?07/02/2021 07/02/2021 1  ?Methylphenidate 5 Mg Tablet ?60.00 [place] [id number] [place] (8321) 0/0  Private Pay [place] ?

## 2021-08-05 ENCOUNTER — Encounter: Payer: Self-pay | Admitting: Physical Therapy

## 2021-08-05 ENCOUNTER — Ambulatory Visit: Payer: Self-pay | Admitting: Physical Therapy

## 2021-08-05 ENCOUNTER — Ambulatory Visit: Payer: Self-pay | Attending: Family Medicine | Admitting: Physical Therapy

## 2021-08-05 VITALS — BP 130/75 | HR 61

## 2021-08-05 DIAGNOSIS — M6281 Muscle weakness (generalized): Secondary | ICD-10-CM

## 2021-08-05 DIAGNOSIS — R279 Unspecified lack of coordination: Secondary | ICD-10-CM

## 2021-08-05 DIAGNOSIS — R2681 Unsteadiness on feet: Secondary | ICD-10-CM

## 2021-08-05 DIAGNOSIS — R2689 Other abnormalities of gait and mobility: Secondary | ICD-10-CM

## 2021-08-05 NOTE — Therapy (Signed)
[place] ?[place] ?[place], [place], [address] ?Phone: [phone]   Fax:  [phone] ? ?Physical Therapy Treatment ? ?Patient Details  ?Name: Amy Barry ?MRN: [id number] ?Date of Birth: 1970-06-13 ?Referring Provider (PT): Dr. Naaman Plummer ? ? ?Encounter Date: 08/05/2021 ? ? 08/05/21 1537  ?PT Visits / Re-Eval  ?Visit Number 18  ?Number of Visits 20  ?Date for PT Re-Evaluation 08/23/21  ?Authorization  ?Authorization Type Aetna - through 09/01/21  ?PT Time Calculation  ?PT Start Time 3557  ?PT Stop Time 1615  ?PT Time Calculation (min) 44 min  ?PT - End of Session  ?Equipment Utilized During Treatment Gait belt  ?Activity Tolerance Patient tolerated treatment well  ?Behavior During Therapy [facility] for tasks assessed/performed  ? ? ? ?Past Medical History:  ?Diagnosis Date  ? Anxiety   ? Arthralgia   ? Generalized weakness   ? bilateral upper and lower extremities,  walks with cane  ? GERD (gastroesophageal reflux disease)   ? Headache(784.0)   ? Heart murmur   ? per pt "since childhood, no problems"  ? History of kidney stones   ? Hypertension   ? followed pcp  ? IBS (irritable bowel syndrome)   ? Iron deficiency anemia   ? 12-06-2017 per pt recently had IV iron infusion July 2019  ? Low vitamin D level   ? Osteoarthritis   ? pt unsure about this  ? PVC (premature ventricular contraction)   ? per pt had holter monitor  ? SLE (systemic lupus erythematosus) ([place])   ? rheumotologist-- dr syed  ? Stroke [facility]) 06/2020  ? Wears glasses   ? ? ?Past Surgical History:  ?Procedure Laterality Date  ? CERVICAL DISC ARTHROPLASTY N/A 10/23/2015  ? Procedure: Cervical Disc Arthroplasty Cervical six-seven;  Surgeon: Eustace Moore, MD;  Location: [place] [facility];  Service: Neurosurgery;  Laterality: N/A;  ? D & C HYSTEROSCOPY W/ RESECTION ENDOMETRIAL POLYP  10-13-2003    dr rivard '@[facility]   ? ESOPHAGOGASTRODUODENOSCOPY    ? for GERD  ? IR ANGIO INTRA EXTRACRAN SEL COM CAROTID  INNOMINATE BILAT MOD SED  07/06/2020  ? IR ANGIO INTRA EXTRACRAN SEL INTERNAL CAROTID BILAT MOD SED  06/29/2020  ? IR ANGIO VERTEBRAL SEL VERTEBRAL BILAT MOD SED  06/29/2020  ? IR ANGIO VERTEBRAL SEL VERTEBRAL BILAT MOD SED  07/06/2020  ? LAPAROSCOPIC ABDOMINAL EXPLORATION  1996  ? dx ibs  ? OVARIAN CYST SURGERY  age 29  ? x2 cyst  ? RADIOLOGY WITH ANESTHESIA N/A 06/29/2020  ? Procedure: IR WITH ANESTHESIA;  Surgeon: Radiologist, Medication, MD;  Location: [place];  Service: Radiology;  Laterality: N/A;  ? ROBOTIC ASSISTED TOTAL HYSTERECTOMY N/A 12/13/2017  ? Procedure: XI ROBOTIC ASSISTED TOTAL HYSTERECTOMYWITH BILATERAL SALPINGECTOMY;  Surgeon: Christophe Louis, MD;  Location: [facility];  Service: Gynecology;  Laterality: N/A;  ? TUBAL LIGATION Bilateral 06-01-2002   dr Mancel Bale '@[facility]   ? PPTL  ? ? ?VITALS: ?RUE in sitting prior to activity: ?Today's Vitals  ? 08/05/21 1545  ?BP: 97/71  ?Pulse: (!) 59  ? ?End of session following STS activity:  130/75, HR 61bpm ? ?SUBJECTIVE: ?She walked around outside with husband's supervision today.  She has not had chance to plant or work on flowerbeds.  She has had some nausea and is currently having a mild headache.  Her daughter reports she does not check her blood sugar but has taken meds 30 mins ago and last meal was 2 boiled eggs at  7am. ? ?PAIN: ?Yes ?4/10 ?Headache on right side of Left eye radiating to posterior left ear ? ?TODAY'S TREATMENT: ?PT provides pt peanut butter crackers and Sprite from ADL kitchen due to recent med intake w/o food, headache and low BP compared to usual BP. ? ?Extensive discussion of upcoming discharge, pt current functional status and cognitive strategies from ST that pt is struggling to use to implement appropriate adherence to med regimen schedule that daughter feels is contributing to nausea and activity tolerance.  Pt states she continues to use alarms which helps, but her sleep schedule sometimes interferes with her med compliance due to being in bed when  alarm goes off.  Discussed pt and daughter speaking with MD about alternative med schedule or additional medicine options with decreased frequency to decrease caregiver burden as pt is concerned about forgetting to take medicines when daughter moves away.   ?Discussed continuing to walk with supervision and continuing HEP to maintain functional status.  Edu on increased independence to manage normal daily activity without relying on daughter. ? ?Pt ambulates 460' w/ RW supervision level w/ min cuing for dec UE reliance on RW.  Intermittent cues to decrease excessive pelvic rotation via dec step size.   ? ?Pt negotiates 8x6" stairs using bilateral rails and close supervision to CGA using reciprocal stepping when ascending and step-to when descending during first bout.  Cued for step through on second bout requiring CGA. ? ?Pt performs STS from EOM w/ BUE support x10 w/ inc time to perform task. ? ?BP reassessed immediately following STS activity. ? ?EDUCATION: ?Began discussion of d/c on 08/19/2021 with pt feeling good about timeframe.  ?Patient and daughter educated.   ?Verbalized Understanding. ? ? ? ? ? ? ? PT Short Term Goals - 06/24/21 1544   ? ?  ? PT SHORT TERM GOAL #1  ? Title = LTG   ? ?  ?  ? ?  ? ? ? ? PT Long Term Goals - 06/24/21 1545   ? ?  ? PT LONG TERM GOAL #1  ? Title Pt will decrease TUG with cane to 60 seconds   ? Baseline 34.5 with RW; 2 minutes with cane   ? Time 8   ? Period Weeks   ? Status Revised   ? Target Date 08/23/21   ?  ? PT LONG TERM GOAL #2  ? Title Pt will increase 6 min walk distance to >350' with cane and RPE 12-13 for improved activity tolerance.   ? Baseline 554' with RW; not performed with cane yet   ? Time 8   ? Period Weeks   ? Status Revised   ? Target Date 08/23/21   ?  ? PT LONG TERM GOAL #3  ? Target Date 06/25/21   ?  ? PT LONG TERM GOAL #4  ? Title Pt will ambulate up/down 8 steps with one UE support on R railing; 8 stairs with one UE support on L railing with  supervision   ? Baseline 8 stairs with one UE support on rail and min A (forwards)   ? Time 8   ? Period Weeks   ? Status Revised   ? Target Date 08/23/21   ? ?  ?  ? ?  ? ? ? ? ? ? ? ? Plan - 07/15/21 1444   ? ? Clinical Impression Statement Session limited due to pt nausea, mild lethargy, and low BP at onset of session.  Pt responds well  to activity with session focused on functional strengthening, stair training, and gait to tolerance.  BP responds well to activity.  Discussed upcoming discharge with pt and family in agreement.  ? Personal Factors and Comorbidities Comorbidity 2;Past/Current Experience;Time since onset of injury/illness/exacerbation   ? Comorbidities medical history of generalized arthritis and myalgias and concern for functional weakness, SLE, irritable bowel,   ? Examination-Activity Limitations Bathing;Caring for Others;Carry;Dressing;Hygiene/Grooming;Lift;Locomotion Level;Reach Overhead;Squat;Stairs;Stand;Transfers   ? Examination-Participation Restrictions Church;Cleaning;Community Activity;Driving;Laundry;Medication Management;Meal Prep;Occupation;Shop;Yard Work;Personal Finances   ? Rehab Potential Good   ? PT Frequency 1x / week   ? PT Duration 8 weeks   ? PT Treatment/Interventions ADLs/Self Care Home Management;Cryotherapy;Moist Heat;Gait training;Stair training;Functional mobility training;Therapeutic activities;Therapeutic exercise;Balance training;Manual techniques;Orthotic Fit/Training;Patient/family education;Cognitive remediation;Neuromuscular re-education;Passive range of motion;Energy conservation;Vestibular;Visual/perceptual remediation/compensation;DME Instruction   ? PT Next Visit Plan Gait with RW as cane was not safe at this time. standing balance with decreased UE support-try bean bag toss in standing, can trial reach across midline to retrieve. Focus on functional activities as extraneous movements increase when working on just balance. Weight shifting forwards during sit  > stand. Big focus will be on trying to increase aerobic activity. Continue NuStep for some aerobic activity and strengthening.   ? PT Home Exercise Plan [email or web address] - therapist could not find code to previous HEP address

## 2021-08-11 ENCOUNTER — Ambulatory Visit: Payer: No Typology Code available for payment source

## 2021-08-13 ENCOUNTER — Encounter: Payer: Self-pay | Admitting: Physical Therapy

## 2021-08-13 ENCOUNTER — Ambulatory Visit: Payer: Self-pay | Admitting: Physical Therapy

## 2021-08-13 VITALS — BP 102/58 | HR 64

## 2021-08-13 DIAGNOSIS — R2681 Unsteadiness on feet: Secondary | ICD-10-CM

## 2021-08-13 DIAGNOSIS — R2689 Other abnormalities of gait and mobility: Secondary | ICD-10-CM

## 2021-08-13 DIAGNOSIS — R279 Unspecified lack of coordination: Secondary | ICD-10-CM

## 2021-08-13 DIAGNOSIS — M6281 Muscle weakness (generalized): Secondary | ICD-10-CM

## 2021-08-13 NOTE — Therapy (Signed)
[place] ?[place] ?[place], [place], [address] ?Phone: [phone]   Fax:  [phone] ? ?Physical Therapy Treatment ? ?Patient Details  ?Name: Amy Barry ?MRN: [id number] ?Date of Birth: 02-06-1971 ?Referring Provider (PT): Dr. Naaman Plummer ? ? ?Encounter Date: 08/13/2021 ? ? PT End of Session - 08/13/21 1238   ? ? Visit Number 19   ? Number of Visits 20   ? Date for PT Re-Evaluation 08/23/21   ? Authorization Type Aetna - through 09/01/21   ? PT Start Time 1233   ? PT Stop Time 1313   ? PT Time Calculation (min) 40 min   ? Equipment Utilized During Treatment Gait belt   ? Activity Tolerance Patient tolerated treatment well   ? Behavior During Therapy [facility] for tasks assessed/performed   ? ?  ?  ? ?  ? ? ? ? ? ?Past Medical History:  ?Diagnosis Date  ? Anxiety   ? Arthralgia   ? Generalized weakness   ? bilateral upper and lower extremities,  walks with cane  ? GERD (gastroesophageal reflux disease)   ? Headache(784.0)   ? Heart murmur   ? per pt "since childhood, no problems"  ? History of kidney stones   ? Hypertension   ? followed pcp  ? IBS (irritable bowel syndrome)   ? Iron deficiency anemia   ? 12-06-2017 per pt recently had IV iron infusion July 2019  ? Low vitamin D level   ? Osteoarthritis   ? pt unsure about this  ? PVC (premature ventricular contraction)   ? per pt had holter monitor  ? SLE (systemic lupus erythematosus) ([place])   ? rheumotologist-- dr syed  ? Stroke [facility]) 06/2020  ? Wears glasses   ? ? ?Past Surgical History:  ?Procedure Laterality Date  ? CERVICAL DISC ARTHROPLASTY N/A 10/23/2015  ? Procedure: Cervical Disc Arthroplasty Cervical six-seven;  Surgeon: Eustace Moore, MD;  Location: [place] [facility];  Service: Neurosurgery;  Laterality: N/A;  ? D & C HYSTEROSCOPY W/ RESECTION ENDOMETRIAL POLYP  10-13-2003    dr rivard '@[facility]   ? ESOPHAGOGASTRODUODENOSCOPY    ? for GERD  ? IR ANGIO INTRA EXTRACRAN SEL COM CAROTID INNOMINATE BILAT MOD SED   07/06/2020  ? IR ANGIO INTRA EXTRACRAN SEL INTERNAL CAROTID BILAT MOD SED  06/29/2020  ? IR ANGIO VERTEBRAL SEL VERTEBRAL BILAT MOD SED  06/29/2020  ? IR ANGIO VERTEBRAL SEL VERTEBRAL BILAT MOD SED  07/06/2020  ? LAPAROSCOPIC ABDOMINAL EXPLORATION  1996  ? dx ibs  ? OVARIAN CYST SURGERY  age 51  ? x2 cyst  ? RADIOLOGY WITH ANESTHESIA N/A 06/29/2020  ? Procedure: IR WITH ANESTHESIA;  Surgeon: Radiologist, Medication, MD;  Location: [place];  Service: Radiology;  Laterality: N/A;  ? ROBOTIC ASSISTED TOTAL HYSTERECTOMY N/A 12/13/2017  ? Procedure: XI ROBOTIC ASSISTED TOTAL HYSTERECTOMYWITH BILATERAL SALPINGECTOMY;  Surgeon: Christophe Louis, MD;  [facility];  Service: Gynecology;  Laterality: N/A;  ? TUBAL LIGATION Bilateral 06-01-2002   dr Mancel Bale '@[facility]   ? PPTL  ? ? ?VITALS: ?RUE in sitting prior to activity: ?Today's Vitals  ? 08/13/21 1241  ?BP: (!) 102/58  ?Pulse: 64  ? ?SUBJECTIVE: ?She tried to make a meal but it didn't quite work out.  No other changes or falls. ? ?PAIN: ?Yes ?5-6/10 ?Headache of left forehead region ? ?TODAY'S TREATMENT: ?Assessed TUG:  28.98 sec w/ RW (pt declines request to try with cane), cane not safest option for pt  at this time ?Pt ambulates 51' continuously w/ RW modI w/ min cuing for inc step size to inc efficiency of gait.  Decreased pelvic rotation and hip flare noted today.  Pt cued to inc speed for initial laps maintaining for approximately 345' before significantly slowing pace. ?Pt performs STS 2x5 from standard chair w/ RUE on arm of chair and LUE on RW w/ inc time to perform task.  Pt requires extended rest between all tasks today and demonstrates need for one step commands. ? ? ? ?EDUCATION: ?Reconfirmed d/c plan for next visit, discussed trying to rely on self more for mobility and simple safe tasks in home.  Edu on working on gait speed at home with family. ?Patient and daughter educated.   ?Verbalized Understanding.  Needs reinforcement. ? ? ? ? ? ? ? PT Short Term Goals - 06/24/21  1544   ? ?  ? PT SHORT TERM GOAL #1  ? Title = LTG   ? ?  ?  ? ?  ? ? ? ? PT Long Term Goals - 06/24/21 1545   ? ?  ? PT LONG TERM GOAL #1  ? Title Pt will decrease TUG with cane to 60 seconds   ? Baseline 34.5 with RW; 2 minutes with cane; 28.98 sec w/ RW (pt declines request to try with cane)  ? Time 8   ? Period Weeks   ? Status PARTIALLY MET  ? Target Date 08/23/21   ?  ? PT LONG TERM GOAL #2  ? Title Pt will increase 6 min walk distance to >350' with cane and RPE 12-13 for improved activity tolerance.   ? Baseline 554' with RW; not performed with cane yet   ? Time 8   ? Period Weeks   ? Status Revised   ? Target Date 08/23/21   ?  ? PT LONG TERM GOAL #3  ? Target Date 06/25/21   ?  ? PT LONG TERM GOAL #4  ? Title Pt will ambulate up/down 8 steps with one UE support on R railing; 8 stairs with one UE support on L railing with supervision   ? Baseline 8 stairs with one UE support on rail and min A (forwards)   ? Time 8   ? Period Weeks   ? Status Revised   ? Target Date 08/23/21   ? ?  ?  ? ?  ? ? ? ? ? ? ? ? Plan - 07/15/21 1444   ? ? Clinical Impression Statement Session focused on initiating assessment of LTGs in preparation for discharge next visit.  Pt in agreement to ending POC next visit.  She performs TUG using RW today in 28.98 sec.  Pt ambulates 805' modI before requiring rest using RW.  She is not appropriate for cane use at this time.  Functional transfers continue to be challenging when position of performance is modified as with STS position today.  Will finish assessing LTGs next visit to discharge.  ? Personal Factors and Comorbidities Comorbidity 2;Past/Current Experience;Time since onset of injury/illness/exacerbation   ? Comorbidities medical history of generalized arthritis and myalgias and concern for functional weakness, SLE, irritable bowel,   ? Examination-Activity Limitations Bathing;Caring for Others;Carry;Dressing;Hygiene/Grooming;Lift;Locomotion Level;Reach  Overhead;Squat;Stairs;Stand;Transfers   ? Examination-Participation Restrictions Church;Cleaning;Community Activity;Driving;Laundry;Medication Management;Meal Prep;Occupation;Shop;Yard Work;Personal Finances   ? Rehab Potential Good   ? PT Frequency 1x / week   ? PT Duration 8 weeks   ? PT Treatment/Interventions ADLs/Self Care Home Management;Cryotherapy;Moist Heat;Gait training;Stair training;Functional  mobility training;Therapeutic activities;Therapeutic exercise;Balance training;Manual techniques;Orthotic Fit/Training;Patient/family education;Cognitive remediation;Neuromuscular re-education;Passive range of motion;Energy conservation;Vestibular;Visual/perceptual remediation/compensation;DME Instruction   ? PT Next Visit Plan Finish LTGs-D/C!  Gait with RW as cane was not safe at this time. standing balance with decreased UE support-try bean bag toss in standing, can trial reach across midline to retrieve. Focus on functional activities as extraneous movements increase when working on just balance. Weight shifting forwards during sit > stand. Big focus will be on trying to increase aerobic activity. Continue NuStep for some aerobic activity and strengthening.   ? PT Home Exercise Plan [email or web address] - therapist could not find code to previous HEP addressed in [place].   ? Consulted and Agree with Plan of Care Patient;Family member/caregiver   ? Family Member Consulted daughter   ? ?  ?  ? ?  ? ? ? ? ?Patient will benefit from skilled therapeutic intervention in order to improve the following deficits and impairments:    ? ?Visit Diagnosis: ?Other abnormalities of gait and mobility ? ?Muscle weakness (generalized) ? ?Unsteadiness on feet ? ?Lack of coordination ? ? ? ? ?Problem List ?Patient Active Problem List  ? Diagnosis Date Noted  ? Generalized anxiety disorder 05/05/2021  ? Biceps tendonitis on left 02/10/2021  ? Vascular headache 08/05/2020  ? Hyponatremia 07/28/2020  ? Headache 07/24/2020  ? Neck pain,  musculoskeletal 07/24/2020  ? Cognitive deficits   ? Frontal lobe and executive function deficit   ? Subarachnoid bleed ([place]) 07/09/2020  ? Subarachnoid hemorrhage ([place]) 06/29/2020  ? 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 S/P hysterectomy 12/13/2017

## 2021-08-18 ENCOUNTER — Encounter: Payer: Self-pay | Admitting: Physical Medicine & Rehabilitation

## 2021-08-18 ENCOUNTER — Encounter: Payer: Self-pay | Attending: Physical Medicine & Rehabilitation | Admitting: Physical Medicine & Rehabilitation

## 2021-08-18 DIAGNOSIS — R4189 Other symptoms and signs involving cognitive functions and awareness: Secondary | ICD-10-CM | POA: Insufficient documentation

## 2021-08-18 DIAGNOSIS — F411 Generalized anxiety disorder: Secondary | ICD-10-CM | POA: Insufficient documentation

## 2021-08-18 MED ORDER — ESCITALOPRAM OXALATE 10 MG PO TABS: 10.0000 mg | ORAL_TABLET | Freq: Every day | ORAL | 4 refills | Status: DC

## 2021-08-18 MED ORDER — METHYLPHENIDATE HCL 5 MG PO TABS: 5.0000 mg | ORAL_TABLET | Freq: Two times a day (BID) | ORAL | 0 refills | Status: DC

## 2021-08-18 NOTE — Progress Notes (Signed)
? ?Subjective:  ? ? Patient ID: Amy Barry, female    DOB: Oct 23, 1970, 51 y.o.   MRN: [id number] ? ?HPI ? ?Amy Barry is here in follow up of her [place] and associated deficits. She found that the ritalin caused her to "buzz" initially for a few weeks but once she became used to it, she has noticed a big difference in her attention, energy, initiation, personality, etc. She also noticed a change when the brand changed and in 1 instance when she had to wait longer for refill, she noticed the medication wearing off.  Overall she feels that her mood is improved and that she has a better handle on things going on around her.  Sleep still can be an issue at times.  She is taking melatonin at nighttime.  Family remains very supportive.  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  Sometimes things get a bit hectic when she is around a lot of people and noises. ? ?Amy Barry is finishing up her outpatient physical therapies.  She has 1 more session this week.  She is gained a lot of range of motion back in her left shoulder.  There are still some tightness along her chest wall but it is much more manageable.  She has a good home exercise program as recommended by therapy.  She is using her rolling [place] for balance.  She denies any falls or mishaps. ? ?From a standpoint of her headaches, she still having regular headaches with increased pain at times.  She uses Topamax 100 mg twice daily with Maxalt as needed for breakthrough. ? ?She is currently without insurance and paying out-of-pocket for medications but will be resuming her insurance in June. ? ?Pain Inventory ?Average Pain 5 ?Pain Right Now 5 ?My pain is  throbbing ? ?LOCATION OF PAIN  head, shoulder ? ?BOWEL ?Number of stools per week: 1-2 ?Oral laxative use No  ?Type of laxative . ?Enema or suppository use No  ?History of colostomy No  ?Incontinent No  ? ?BLADDER ?Normal ?In and out cath, frequency . ?Able to self cath  . ?Bladder incontinence No   ?Frequent urination No  ?Leakage with coughing No  ?Difficulty starting stream No  ?Incomplete bladder emptying No  ? ? ?Mobility ?walk with assistance ?use a [place] ?ability to climb steps?  yes ?do you drive?  no ? ?Function ?disabled: date disabled . ?I need assistance with the following:  household duties and shopping ? ?Neuro/Psych ?trouble walking ? ?Prior Studies ?Any changes since last visit?  no ? ?Physicians involved in your care ?Any changes since last visit?  no ? ? ?Family History  ?Problem Relation Age of Onset  ? Hypertension Mother   ?     hx aneursym and surgery  ? Atrial fibrillation Mother   ? Sarcoidosis Mother   ? Anuerysm Mother   ?     brain  ? Arthritis Mother   ? Healthy Father   ? Asthma Sister   ? Leukemia Maternal Grandmother   ? Heart disease Maternal Grandmother   ? Stroke Paternal Grandmother   ? Hypertension Daughter   ? Colon polyps Daughter 9  ?     hermatoma oversized polyps-benign  ? Hypertension Daughter   ? Hyperlipidemia Other   ? Colon cancer Neg Hx   ? ?Social History  ? ?Socioeconomic History  ? Marital status: Married  ?  Spouse name: Not on file  ? Number of children: 2  ?  Years of education: College  ? Highest education level: Not on file  ?Occupational History  ? Occupation: Therapist, sports  ?  Employer: [employer]  ?Tobacco Use  ? Smoking status: Never  ? Smokeless tobacco: Never  ?Vaping Use  ? Vaping Use: Never used  ?Substance and Sexual Activity  ? Alcohol use: No  ?  Alcohol/week: 0.0 standard drinks  ? Drug use: Never  ? Sexual activity: Yes  ?  Partners: Male  ?  Birth control/protection: Surgical  ?Other Topics Concern  ? Not on file  ?Social History Narrative  ? Lives at home with husband.  ? Right-handed.  ? Occasional caffeine use.  ? ?Social Determinants of Health  ? ?Financial Resource Strain: Not on file  ?Food Insecurity: Not on file  ?Transportation Needs: Not on file  ?Physical Activity: Not on file  ?Stress: Not on file  ?Social Connections: Not on file   ? ?Past Surgical History:  ?Procedure Laterality Date  ? CERVICAL DISC ARTHROPLASTY N/A 10/23/2015  ? Procedure: Cervical Disc Arthroplasty Cervical six-seven;  Surgeon: Eustace Moore, MD;  Location: [facility];  Service: Neurosurgery;  Laterality: N/A;  ? D & C HYSTEROSCOPY W/ RESECTION ENDOMETRIAL POLYP  10-13-2003    dr rivard '@[facility]   ? ESOPHAGOGASTRODUODENOSCOPY    ? for GERD  ? IR ANGIO INTRA EXTRACRAN SEL COM CAROTID INNOMINATE BILAT MOD SED  07/06/2020  ? IR ANGIO INTRA EXTRACRAN SEL INTERNAL CAROTID BILAT MOD SED  06/29/2020  ? IR ANGIO VERTEBRAL SEL VERTEBRAL BILAT MOD SED  06/29/2020  ? IR ANGIO VERTEBRAL SEL VERTEBRAL BILAT MOD SED  07/06/2020  ? LAPAROSCOPIC ABDOMINAL EXPLORATION  1996  ? dx ibs  ? OVARIAN CYST SURGERY  age 24  ? x2 cyst  ? RADIOLOGY WITH ANESTHESIA N/A 06/29/2020  ? Procedure: IR WITH ANESTHESIA;  Surgeon: Radiologist, Medication, MD;  Location: [facility];  Service: Radiology;  Laterality: N/A;  ? ROBOTIC ASSISTED TOTAL HYSTERECTOMY N/A 12/13/2017  ? Procedure: XI ROBOTIC ASSISTED TOTAL HYSTERECTOMYWITH BILATERAL SALPINGECTOMY;  Surgeon: Christophe Louis, MD;  Location: [facility];  Service: Gynecology;  Laterality: N/A;  ? TUBAL LIGATION Bilateral 06-01-2002   dr Mancel Bale '@[facility]   ? PPTL  ? ?Past Medical History:  ?Diagnosis Date  ? Anxiety   ? Arthralgia   ? Generalized weakness   ? bilateral upper and lower extremities,  walks with cane  ? GERD (gastroesophageal reflux disease)   ? Headache(784.0)   ? Heart murmur   ? per pt "since childhood, no problems"  ? History of kidney stones   ? Hypertension   ? followed pcp  ? IBS (irritable bowel syndrome)   ? Iron deficiency anemia   ? 12-06-2017 per pt recently had IV iron infusion July 2019  ? Low vitamin D level   ? Osteoarthritis   ? pt unsure about this  ? PVC (premature ventricular contraction)   ? per pt had holter monitor  ? SLE (systemic lupus erythematosus) ([place])   ? rheumotologist-- dr syed  ? Stroke [facility]) 06/2020  ? Wears glasses   ? ?BP 117/78   Pulse  61   Ht '5\' 1"'$  (1.549 m)   Wt 158 lb (71.7 kg)   LMP 12/22/2017   SpO2 98%   BMI 29.85 kg/m?  ? ?Opioid Risk Score:   ?Fall Risk Score:  `1 ? ?Depression screen PHQ 2/9 ? ? ?  08/18/2021  ?  2:57 PM 05/05/2021  ?  3:12 PM 02/10/2021  ?  2:44  PM 11/04/2020  ?  1:23 PM 08/05/2020  ? 11:43 AM 03/04/2019  ?  3:06 PM  ?Depression screen PHQ 2/9  ?Decreased Interest 0 1 0 0 0 0  ?Down, Depressed, Hopeless 0 1 0 0 0 0  ?PHQ - 2 Score 0 2 0 0 0 0  ?  ? ?Review of Systems  ?Musculoskeletal:  Positive for gait problem.  ?     Shoulder pain  ?Neurological:  Positive for headaches.  ?All other systems reviewed and are negative. ? ?   ?Objective:  ? Physical Exam ? ?General: No acute distress ?HEENT: NCAT, EOMI, oral membranes moist ?Cards: reg rate  ?Chest: normal effort ?Abdomen: Soft, NT, ND ?Skin: dry, intact ?Extremities: no edema ?Psych: pleasant and appropriate, much more dynamic ?Neuro: farily alert and oriented x 3. fair insight and awareness.  dramatic improvement in memory and concentration. Normal language and speech. Cranial nerve exam unremarkable. UE nearly 4/5. LE: 3 to 4/5 distally.  Slight left weakness more than right  ?Musculoskeletal: left shoulder easily ranged to 90 abductioin and in ER/iR. ?  ?  ?  ?  ?  ?Assessment & Plan:  ?1.  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 ?            -continue outpt therapies thru this week. ? -She has made impressive gains with therapy and has improved quite a bit from a cognitive and mobility standpoint. ?            -continue ritalin for concentration, '5mg'$  bid--consider change to long acting for more consistent delivery ?  -could consider further titration ?               ?              ?2.  Left shoulder pain, bicipital tendonitis. :  ?            - ice TID.  ?            -outpt OT completed with substantial improvement in her left shoulder range of motion and pain ?             -topical modalities for myalgias, Robaxin  as needed ?            -She will need to continue working on her home exercise program to maintain her shoulder movement in the long-term.             ?3.  Se

## 2021-08-18 NOTE — Patient Instructions (Signed)
PLEASE FEEL FREE TO CALL OUR OFFICE WITH ANY PROBLEMS OR QUESTIONS ([phone])      

## 2021-08-19 ENCOUNTER — Encounter: Payer: Self-pay | Admitting: Physical Therapy

## 2021-08-19 ENCOUNTER — Ambulatory Visit: Payer: Self-pay | Admitting: Physical Therapy

## 2021-08-19 DIAGNOSIS — M6281 Muscle weakness (generalized): Secondary | ICD-10-CM

## 2021-08-19 DIAGNOSIS — R2689 Other abnormalities of gait and mobility: Secondary | ICD-10-CM

## 2021-08-19 DIAGNOSIS — R279 Unspecified lack of coordination: Secondary | ICD-10-CM

## 2021-08-19 DIAGNOSIS — R2681 Unsteadiness on feet: Secondary | ICD-10-CM

## 2021-08-19 NOTE — Therapy (Signed)
[facility] [address] [place], [place], [address] Phone: [phone]   Fax:  [phone]  Physical Therapy Treatment/DISCHARGE SUMMARY  Patient Details  Name: Amy Barry MRN: [id number] Date of Birth: [birth year] Referring Provider (PT): Dr. Naaman Plummer   Encounter Date: 08/19/2021 PHYSICAL THERAPY DISCHARGE SUMMARY  Visits from Start of Care: 20  Current functional level related to goals / functional outcomes: See clinical impression.   Remaining deficits: Intermittent right knee buckle, decreased activity tolerance, impaired static balance w/o UE support   Education / Equipment: RW; edu on d/c plan and continuing HEP   Patient agrees to discharge. Patient goals were partially met. Patient is being discharged due to maximized rehab potential.     PT End of Session - 08/19/21 1155     Visit Number 20    Number of Visits 20    Date for PT Re-Evaluation 08/23/21    Authorization Type [employer] - through 09/01/21    PT Start Time 1150    PT Stop Time 1210    PT Time Calculation (min) 20 min    Equipment Utilized During Treatment Gait belt    Activity Tolerance Patient tolerated treatment well    Behavior During Therapy WFL for tasks assessed/performed                Past Medical History:  Diagnosis Date   Anxiety    Arthralgia    Generalized weakness    bilateral upper and lower extremities,  walks with cane   GERD (gastroesophageal reflux disease)    Headache(784.0)    Heart murmur    per pt "since childhood, no problems"   History of kidney stones    Hypertension    followed pcp   IBS (irritable bowel syndrome)    Iron deficiency anemia    12-06-2017 per pt recently had IV iron infusion July 2019   Low vitamin D level    Osteoarthritis    pt unsure about this   PVC (premature ventricular contraction)    per pt had holter monitor   SLE (systemic lupus erythematosus) ([place])    rheumotologist--  dr syed   Stroke [facility]) 06/2020   Wears glasses     Past Surgical History:  Procedure Laterality Date   CERVICAL DISC ARTHROPLASTY N/A 10/23/2015   Procedure: Cervical Disc Arthroplasty Cervical six-seven;  Surgeon: Eustace Moore, MD;  Location: [facility];  Service: Neurosurgery;  Laterality: N/A;   D & C HYSTEROSCOPY W/ RESECTION ENDOMETRIAL POLYP  10-13-2003    dr rivard _0    ESOPHAGOGASTRODUODENOSCOPY     for GERD   IR ANGIO INTRA EXTRACRAN SEL COM CAROTID INNOMINATE BILAT MOD SED  07/06/2020   IR ANGIO INTRA EXTRACRAN SEL INTERNAL CAROTID BILAT MOD SED  06/29/2020   IR ANGIO VERTEBRAL SEL VERTEBRAL BILAT MOD SED  06/29/2020   IR ANGIO VERTEBRAL SEL VERTEBRAL BILAT MOD SED  07/06/2020   LAPAROSCOPIC ABDOMINAL EXPLORATION  1996   dx ibs   OVARIAN CYST SURGERY  age 27   x2 cyst   RADIOLOGY WITH ANESTHESIA N/A 06/29/2020   Procedure: IR WITH ANESTHESIA;  Surgeon: Radiologist, Medication, MD;  Location: [facility];  Service: Radiology;  Laterality: N/A;   ROBOTIC ASSISTED TOTAL HYSTERECTOMY N/A 12/13/2017   Procedure: XI ROBOTIC ASSISTED TOTAL HYSTERECTOMYWITH BILATERAL SALPINGECTOMY;  Surgeon: Christophe Louis, MD;  Location: [facility];  Service: Gynecology;  Laterality: N/A;   TUBAL LIGATION Bilateral 06-01-2002   dr Mancel Bale _1   PPTL    SUBJECTIVE: She states things are going fine at home, no falls, been using alarms for meds, changed one to 6:30pm.  She saw Dr. Naaman Plummer yesterday and her Lexapro was upped and she has some upcoming med changes once her insurance changes in about a month.  PAIN: Yes 4/10 Left shoulder; headache is also a 4/10  TODAY'S TREATMENT: Assessed 6MWT:  90' w/ RW supervision level, RPE of 15- good upright posture throughout, one incidence of buckling of right knee with pt able to self correct using UE support 8x6" stairs supervision level w/ BUE on right rail; cues to prevent pulling with UE  EDUCATION: D/c plan.  Continued use of RW for safety and HEP for maintenance  of gains. Patient and daughter educated.   Verbalized Understanding.  Needs reinforcement.        PT Short Term Goals - 06/24/21 1544       PT SHORT TERM GOAL #1   Title = LTG               PT Long Term Goals - 06/24/21 1545       PT LONG TERM GOAL #1   Title Pt will decrease TUG with cane to 60 seconds    Baseline 34.5 with RW; 2 minutes with cane; 28.98 sec w/ RW (pt declines request to try with cane)   Time 8    Period Weeks    Status PARTIALLY MET   Target Date 08/23/21      PT LONG TERM GOAL #2   Title Pt will increase 6 min walk distance to >350' with cane and RPE 12-13 for improved activity tolerance.    Baseline 554' with RW; not performed with cane yet; 585' w/ RW supervision level, RPE of 15    Time 8    Period Weeks    Status PARTIALLY MET   Target Date 08/23/21      PT LONG TERM GOAL #3   Target Date 06/25/21      PT LONG TERM GOAL #4   Title Pt will ambulate up/down 8 steps with one UE support on R railing    Baseline 8 stairs with one UE support on rail and min A (forwards); 8 stairs supervision level w/ BUE on right rail.   Time 8    Period Weeks    Status PARTIALLY MET    Target Date 08/23/21                   Plan - 07/15/21 1444     Clinical Impression Statement Finished assessing long term goals this session with pt demonstrating improved gait distance and quality of gait with RW.  Kasandra Knudsen is not an appropriate AD option at this time.  She is able to manage stairs supervision level with BUE use on right rail with min cues to prevent pulling instead of engaging lower extremities.  Last session her TUG improved to 28.98 sec with use of RW.  At this time she has maximized current functional potential and is appropriate to continue management at home.  Patient and daughter in agreement.   Personal Factors and Comorbidities Comorbidity 2;Past/Current Experience;Time since onset of injury/illness/exacerbation    Comorbidities medical  history of generalized arthritis and myalgias and concern for functional weakness, SLE, irritable bowel,    Examination-Activity Limitations Bathing;Caring for Others;Carry;Dressing;Hygiene/Grooming;Lift;Locomotion Level;Reach Overhead;Squat;Stairs;Stand;Transfers    Examination-Participation Restrictions Church;Cleaning;Community Activity;Driving;Laundry;Medication Management;Meal Prep;Occupation;Shop;Yard Work;Personal Finances    Rehab Potential Good    PT  Frequency 1x / week    PT Duration 8 weeks    PT Treatment/Interventions ADLs/Self Care Home Management;Cryotherapy;Moist Heat;Gait training;Stair training;Functional mobility training;Therapeutic activities;Therapeutic exercise;Balance training;Manual techniques;Orthotic Fit/Training;Patient/family education;Cognitive remediation;Neuromuscular re-education;Passive range of motion;Energy conservation;Vestibular;Visual/perceptual remediation/compensation;DME Instruction    PT Next Visit Plan N/A   PT Home Exercise Plan [id number] - therapist could not find code to previous HEP addressed in [place].    Consulted and Agree with Plan of Care Patient;Family member/caregiver    Family Member Consulted daughter               Patient will benefit from skilled therapeutic intervention in order to improve the following deficits and impairments:     Visit Diagnosis: Other abnormalities of gait and mobility  Muscle weakness (generalized)  Unsteadiness on feet  Lack of coordination     Problem List Patient Active Problem List   Diagnosis Date Noted   Generalized anxiety disorder 05/05/2021   Biceps tendonitis on left 02/10/2021   Vascular headache 08/05/2020   Hyponatremia 07/28/2020   Headache 07/24/2020   Neck pain, musculoskeletal 07/24/2020   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    Subarachnoid bleed ([place]) 07/09/2020   Subarachnoid hemorrhage ([place]) 06/29/2020   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[place]) 03/04/2019   S/P  hysterectomy 12/13/2017   Abnormal laboratory test 09/09/2016   Gait abnormality 08/03/2016   Weakness 08/03/2016   Paresthesia 08/03/2016   S/P cervical spinal fusion 10/23/2015   Hypersomnia 12/08/2010   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   EPIGASTRIC PAIN 10/13/2009   IRRITABLE BOWEL SYNDROME 03/10/2009   BACK PAIN, LUMBAR 07/01/2008   COSTOCHONDRITIS 02/01/2008   VIRAL URI 01/08/2008   CHEST PAIN 01/08/2008   OTHER SPECIFIED EPISODIC MOOD DISORDER 12/25/2007   ARM PAIN 12/25/2007   ANEMIA-NOS 10/02/2007   Essential hypertension 10/02/2007   GERD 10/02/2007   POLYP, GALLBLADDER 10/02/2007   UTERINE POLYP 10/02/2007   OSTEOARTHRITIS 10/02/2007   Dyskinesia 10/02/2007   HEADACHE 10/02/2007   PALPITATIONS 10/02/2007   CARDIAC MURMUR, HX OF 10/02/2007   NEPHROLITHIASIS, HX OF 10/02/2007      Elease Etienne, PT, DPT  08/19/21    12:36 PM    [facility] [address] [place], [place], [address] Phone: [phone]   Fax:  [phone]  Name: Amy Barry MRN: [id number] Date of Birth: [birth year]

## 2021-08-26 ENCOUNTER — Other Ambulatory Visit: Payer: Self-pay | Admitting: *Deleted

## 2021-08-26 DIAGNOSIS — G441 Vascular headache, not elsewhere classified: Secondary | ICD-10-CM

## 2021-08-26 MED ORDER — TOPIRAMATE 100 MG PO TABS: 100.0000 mg | ORAL_TABLET | Freq: Two times a day (BID) | ORAL | 2 refills | Status: DC

## 2021-09-01 ENCOUNTER — Other Ambulatory Visit: Payer: Self-pay | Admitting: Physical Medicine & Rehabilitation

## 2021-09-01 DIAGNOSIS — F411 Generalized anxiety disorder: Secondary | ICD-10-CM

## 2021-09-03 ENCOUNTER — Other Ambulatory Visit: Payer: Self-pay | Admitting: Physical Medicine & Rehabilitation

## 2021-09-03 ENCOUNTER — Telehealth: Payer: Self-pay | Admitting: *Deleted

## 2021-09-03 MED ORDER — METHYLPHENIDATE HCL ER (LA) 10 MG PO CP24: 10.0000 mg | ORAL_CAPSULE | Freq: Every day | ORAL | 0 refills | Status: DC

## 2021-09-03 NOTE — Telephone Encounter (Signed)
[facility] called and says that she is ready to start her new dosage of methylphenidate. Reports Dr Naaman Plummer wants to start her on extended release methylphenidate.

## 2021-09-03 NOTE — Telephone Encounter (Signed)
Rx sent in for ritalin LA '10mg'$  daily to her pharmacy.

## 2021-09-27 ENCOUNTER — Other Ambulatory Visit: Payer: Self-pay | Admitting: Physical Medicine & Rehabilitation

## 2021-09-29 DIAGNOSIS — I10 Essential (primary) hypertension: Secondary | ICD-10-CM | POA: Diagnosis not present

## 2021-10-04 ENCOUNTER — Telehealth: Payer: Self-pay

## 2021-10-04 NOTE — Telephone Encounter (Signed)
Patient called Methylphenidate LA 10 mg need refill, last filled #30 on 09/04/21.

## 2021-10-05 MED ORDER — METHYLPHENIDATE HCL ER (LA) 10 MG PO CP24: 10.0000 mg | ORAL_CAPSULE | Freq: Every day | ORAL | 0 refills | Status: DC

## 2021-10-05 NOTE — Telephone Encounter (Signed)
Rx refilled.

## 2021-11-01 ENCOUNTER — Other Ambulatory Visit: Payer: Self-pay | Admitting: Physical Medicine & Rehabilitation

## 2021-11-01 DIAGNOSIS — I609 Nontraumatic subarachnoid hemorrhage, unspecified: Secondary | ICD-10-CM

## 2021-11-03 ENCOUNTER — Telehealth: Payer: Self-pay

## 2021-11-03 MED ORDER — METHYLPHENIDATE HCL ER (LA) 10 MG PO CP24: 10.0000 mg | ORAL_CAPSULE | Freq: Every day | ORAL | 0 refills | Status: DC

## 2021-11-03 NOTE — Telephone Encounter (Signed)
Rx written and sent to the pharmacy. Thanks!  

## 2021-11-03 NOTE — Telephone Encounter (Signed)
Patient called for the Methylphenidate refill 10 mg. Please send to [facility] on [address].   (PMP is not currently working).

## 2021-11-10 ENCOUNTER — Encounter: Payer: Self-pay | Admitting: Physical Medicine & Rehabilitation

## 2021-11-10 ENCOUNTER — Encounter: Payer: 59 | Attending: Physical Medicine & Rehabilitation | Admitting: Physical Medicine & Rehabilitation

## 2021-11-10 VITALS — BP 115/79 | HR 70 | Ht 61.0 in | Wt 157.8 lb

## 2021-11-10 DIAGNOSIS — R41844 Frontal lobe and executive function deficit: Secondary | ICD-10-CM | POA: Diagnosis not present

## 2021-11-10 DIAGNOSIS — I609 Nontraumatic subarachnoid hemorrhage, unspecified: Secondary | ICD-10-CM | POA: Diagnosis not present

## 2021-11-10 DIAGNOSIS — G441 Vascular headache, not elsewhere classified: Secondary | ICD-10-CM | POA: Diagnosis not present

## 2021-11-10 DIAGNOSIS — F411 Generalized anxiety disorder: Secondary | ICD-10-CM | POA: Insufficient documentation

## 2021-11-10 MED ORDER — METHYLPHENIDATE HCL ER (OSM) 18 MG PO TBCR: 18.0000 mg | EXTENDED_RELEASE_TABLET | Freq: Every day | ORAL | 0 refills | Status: DC

## 2021-11-10 MED ORDER — RIZATRIPTAN BENZOATE 5 MG PO TABS
5.0000 mg | ORAL_TABLET | Freq: Once | ORAL | 2 refills | Status: DC | PRN
Start: 2021-11-10 — End: 2023-08-23

## 2021-11-10 NOTE — Patient Instructions (Signed)
PLEASE FEEL FREE TO CALL OUR OFFICE WITH ANY PROBLEMS OR QUESTIONS ([phone])      

## 2021-11-10 NOTE — Progress Notes (Signed)
Subjective:    Patient ID: Amy Barry, female    DOB: 1970-05-01, 51 y.o.   MRN: [id number]  HPI  Briahnna is here in follow-up of her ongoing mobility and cognitive deficits related to her subarachnoid hemorrhage.  She was doing quite well the last time I saw her.  Unfortunately during this some issues at the pharmacy and finding Ritalin.  They could not find a nearby pharmacy to carry the drug and ultimately she has been without it for about a week or so.  Since stopping it she is definitely taken a step backwards and has problems with initiation and focus.  Her husband states that she does well initially in the morning but then tends to slowly lose her focus and concentration.  She came to the office today almost in a catatonic state where she would not speak and was struggling to mumble unintelligible words.  He also reports that she tends to stay at home a lot especially up in the second floor of their home.  She does some chores and things around the house but seems to be 1 to keep to herself.  He is trying to encourage her to do more outside but she seems to be resistant to it.  It was hard to discussed that with the patient today given her cognitive/behavioral state.    She still is having headaches.  She remains on Topamax 100 mg twice daily with Maxalt as needed for breakthrough.  Pain Inventory Average Pain 7 Pain Right Now 7 My pain is  unknown  LOCATION OF PAIN  head  BOWEL Number of stools per week: unsure   BLADDER Normal   Mobility use a [place] ability to climb steps?  yes do you drive?  no  Function not employed: date last employed . I need assistance with the following:  meal prep  Neuro/Psych trouble walking confusion anxiety  Prior Studies Any changes since last visit?  no  Physicians involved in your care Any changes since last visit?  no   Family History  Problem Relation Age of Onset   Hypertension Mother        hx aneursym and surgery   Atrial  fibrillation Mother    Sarcoidosis Mother    Anuerysm Mother        brain   Arthritis Mother    Healthy Father    Asthma Sister    Leukemia Maternal Grandmother    Heart disease Maternal Grandmother    Stroke Paternal Grandmother    Hypertension Daughter    Colon polyps Daughter 32       hermatoma oversized polyps-benign   Hypertension Daughter    Hyperlipidemia Other    Colon cancer Neg Hx    Social History   Socioeconomic History   Marital status: Married    Spouse name: Not on file   Number of children: 2   Years of education: College   Highest education level: Not on file  Occupational History   Occupation: Programmer, multimedia: [employer]  Tobacco Use   Smoking status: Never   Smokeless tobacco: Never  Vaping Use   Vaping Use: Never used  Substance and Sexual Activity   Alcohol use: No    Alcohol/week: 0.0 standard drinks of alcohol   Drug use: Never   Sexual activity: Yes    Partners: Male    Birth control/protection: Surgical  Other Topics Concern   Not on file  Social History Narrative  Lives at home with husband.   Right-handed.   Occasional caffeine use.   Social Determinants of Health   Financial Resource Strain: Not on file  Food Insecurity: Not on file  Transportation Needs: Not on file  Physical Activity: Not on file  Stress: Not on file  Social Connections: Not on file   Past Surgical History:  Procedure Laterality Date   CERVICAL DISC ARTHROPLASTY N/A 10/23/2015   Procedure: Cervical Disc Arthroplasty Cervical six-seven;  Surgeon: Eustace Moore, MD;  Location: [facility];  Service: Neurosurgery;  Laterality: N/A;   D & C HYSTEROSCOPY W/ RESECTION ENDOMETRIAL POLYP  10-13-2003    dr rivard '@[facility]    ESOPHAGOGASTRODUODENOSCOPY     for GERD   IR ANGIO INTRA EXTRACRAN SEL COM CAROTID INNOMINATE BILAT MOD SED  07/06/2020   IR ANGIO INTRA EXTRACRAN SEL INTERNAL CAROTID BILAT MOD SED  06/29/2020   IR ANGIO VERTEBRAL SEL VERTEBRAL BILAT MOD SED   06/29/2020   IR ANGIO VERTEBRAL SEL VERTEBRAL BILAT MOD SED  07/06/2020   LAPAROSCOPIC ABDOMINAL EXPLORATION  1996   dx ibs   OVARIAN CYST SURGERY  age 20   x2 cyst   RADIOLOGY WITH ANESTHESIA N/A 06/29/2020   Procedure: IR WITH ANESTHESIA;  Surgeon: Radiologist, Medication, MD;  Location: [facility];  Service: Radiology;  Laterality: N/A;   ROBOTIC ASSISTED TOTAL HYSTERECTOMY N/A 12/13/2017   Procedure: XI ROBOTIC ASSISTED TOTAL HYSTERECTOMYWITH BILATERAL SALPINGECTOMY;  Surgeon: Christophe Louis, MD;  Location: [facility];  Service: Gynecology;  Laterality: N/A;   TUBAL LIGATION Bilateral 06-01-2002   dr Mancel Bale '@[facility]    PPTL   Past Medical History:  Diagnosis Date   Anxiety    Arthralgia    Generalized weakness    bilateral upper and lower extremities,  walks with cane   GERD (gastroesophageal reflux disease)    Headache(784.0)    Heart murmur    per pt "since childhood, no problems"   History of kidney stones    Hypertension    followed pcp   IBS (irritable bowel syndrome)    Iron deficiency anemia    12-06-2017 per pt recently had IV iron infusion July 2019   Low vitamin D level    Osteoarthritis    pt unsure about this   PVC (premature ventricular contraction)    per pt had holter monitor   SLE (systemic lupus erythematosus) ([place])    rheumotologist-- dr syed   Stroke [facility]) 06/2020   Wears glasses    BP 115/79   Pulse 70   Ht '5\' 1"'$  (1.549 m)   Wt 157 lb 12.8 oz (71.6 kg)   LMP 12/22/2017   SpO2 98%   BMI 29.82 kg/m   Opioid Risk Score:   Fall Risk Score:  `1  Depression screen PHQ 2/9     11/10/2021    4:02 PM 08/18/2021    2:57 PM 05/05/2021    3:12 PM 02/10/2021    2:44 PM 11/04/2020    1:23 PM 08/05/2020   11:43 AM 03/04/2019    3:06 PM  Depression screen PHQ 2/9  Decreased Interest 0 0 1 0 0 0 0  Down, Depressed, Hopeless 0 0 1 0 0 0 0  PHQ - 2 Score 0 0 2 0 0 0 0     Review of Systems  Constitutional:  Positive for unexpected weight change.  HENT: Negative.    Eyes:  Negative.   Respiratory: Negative.    Cardiovascular: Negative.   Gastrointestinal:  Positive for constipation and nausea.  Endocrine: Negative.   Genitourinary:  Positive for dysuria.  Musculoskeletal:  Positive for gait problem.  Skin: Negative.   Allergic/Immunologic: Negative.   Neurological:  Positive for headaches.  Psychiatric/Behavioral:  Positive for confusion. The patient is nervous/anxious.       Objective:   Physical Exam General: No acute distress HEENT: NCAT, EOMI, oral membranes moist Cards: reg rate  Chest: normal effort Abdomen: Soft, NT, ND Skin: dry, intact Extremities: no edema Psych: pleasant and appropriate, much more dynamic Neuro: farily alert and oriented x 3. fair insight and awareness.  dramatic improvement in memory and concentration. Normal language and speech. Cranial nerve exam unremarkable. UE nearly 4/5. LE: 3 to 4/5 distally.  Slight left weakness more than right  Musculoskeletal: left shoulder easily ranged to 90 abductioin and in ER/iR.           Assessment & Plan:  1.  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             -Discussed home exercise program and activities outside the house.             -Lan had made nice gains until her Ritalin ran out a week or so ago.  She was almost in a catatonic state initially in the office today although she seemed to bounce back a bit as we went through the appointment.                              2.  Left shoulder pain, bicipital tendonitis. :              - ice TID.              -outpt OT completed with substantial improvement in her left shoulder range of motion and pain             -topical modalities for myalgias, Robaxin  as needed             -hep.             3.  Seizure prophylaxis: keppra '500mg'$  bid---  4.  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  Migraine/vascular  headaches---have improved in general             -topamax '100mg'$  bid--stay with this for now             -Maxalt 5 mg 1 to 2 tablets daily as needed for migraine headaches                          -Refilled today             -vision/anxiety component             -relaxation/rest when needed             -Consider CGRP drug if needed.  Would not change things now given the change to Concerta 6. Anxiety and depression             -resume stimulant, change to concerta '18mg'$  d/t availability             -lexapro  '10mg'$  qhs--May need to try another agent             -regular sleep encouraged             -  family/social supports             -neuropsych referral was made. May need to refer out to someone else in community              -social reacclimation still needed             -melatoninn '10mg'$     30 minutes of face to face patient care time were spent during this visit. All questions were encouraged and answered.  Follow up with me in 2 mos .

## 2021-11-19 ENCOUNTER — Other Ambulatory Visit: Payer: Self-pay | Admitting: Physical Medicine & Rehabilitation

## 2021-11-19 DIAGNOSIS — G441 Vascular headache, not elsewhere classified: Secondary | ICD-10-CM

## 2021-11-23 ENCOUNTER — Other Ambulatory Visit: Payer: Self-pay | Admitting: Physical Medicine & Rehabilitation

## 2021-12-10 ENCOUNTER — Telehealth: Payer: Self-pay

## 2021-12-10 DIAGNOSIS — R41844 Frontal lobe and executive function deficit: Secondary | ICD-10-CM

## 2021-12-10 DIAGNOSIS — I609 Nontraumatic subarachnoid hemorrhage, unspecified: Secondary | ICD-10-CM

## 2021-12-10 MED ORDER — METHYLPHENIDATE HCL ER (OSM) 18 MG PO TBCR: 18.0000 mg | EXTENDED_RELEASE_TABLET | Freq: Every day | ORAL | 0 refills | Status: DC

## 2021-12-10 NOTE — Telephone Encounter (Signed)
Rx written and sent to the pharmacy. Thanks!  

## 2021-12-10 NOTE — Telephone Encounter (Signed)
Patient is calling for a refill on Methylphenidate ER 18 mg. Per PMP, last fill was 11/10/21

## 2022-01-10 ENCOUNTER — Telehealth: Payer: Self-pay

## 2022-01-10 DIAGNOSIS — R41844 Frontal lobe and executive function deficit: Secondary | ICD-10-CM

## 2022-01-10 DIAGNOSIS — I609 Nontraumatic subarachnoid hemorrhage, unspecified: Secondary | ICD-10-CM

## 2022-01-10 MED ORDER — METHYLPHENIDATE HCL ER (OSM) 18 MG PO TBCR: 18.0000 mg | EXTENDED_RELEASE_TABLET | Freq: Every day | ORAL | 0 refills | Status: DC

## 2022-01-10 NOTE — Telephone Encounter (Signed)
Rx written and sent to the pharmacy for oct and nov. Thanks!

## 2022-01-10 NOTE — Telephone Encounter (Signed)
2 attempts to inform patient. I am not able to leave a message.

## 2022-01-10 NOTE — Telephone Encounter (Signed)
PMP REPORT:   Filled  Written  ID  Drug  QTY  Days  Prescriber  RX #  Dispenser  Refill  Daily Dose*  Pymt Type  PMP  12/10/2021 12/10/2021 1  Methylphenidate Er 18 Mg Tab 30.00 30 Za Swa [id number] Nor ([id number]) 0/0  Private Pay [place] 11/10/2021 11/10/2021 3  Methylphenidate Er 18 Mg Tab 30.00 30 Za Swa [id number] Nor ([id number]) 0/0  Comm Ins [place] 10/05/2021 10/05/2021 1  Methylphenidate La 10 Mg Cap 30.00 [place] [id number] Nor ([id number]) 0/0  Private Pay [place]

## 2022-01-19 ENCOUNTER — Other Ambulatory Visit: Payer: Self-pay | Admitting: Physical Medicine & Rehabilitation

## 2022-01-19 ENCOUNTER — Encounter: Payer: 59 | Attending: Physical Medicine & Rehabilitation | Admitting: Physical Medicine & Rehabilitation

## 2022-01-19 ENCOUNTER — Encounter: Payer: Self-pay | Admitting: Physical Medicine & Rehabilitation

## 2022-01-19 VITALS — BP 141/85 | HR 58 | Wt 157.8 lb

## 2022-01-19 DIAGNOSIS — G441 Vascular headache, not elsewhere classified: Secondary | ICD-10-CM

## 2022-01-19 DIAGNOSIS — R5383 Other fatigue: Secondary | ICD-10-CM

## 2022-01-19 DIAGNOSIS — F329 Major depressive disorder, single episode, unspecified: Secondary | ICD-10-CM

## 2022-01-19 DIAGNOSIS — I609 Nontraumatic subarachnoid hemorrhage, unspecified: Secondary | ICD-10-CM

## 2022-01-19 DIAGNOSIS — E039 Hypothyroidism, unspecified: Secondary | ICD-10-CM

## 2022-01-19 DIAGNOSIS — M7522 Bicipital tendinitis, left shoulder: Secondary | ICD-10-CM

## 2022-01-19 DIAGNOSIS — E559 Vitamin D deficiency, unspecified: Secondary | ICD-10-CM

## 2022-01-19 MED ORDER — METHYLPHENIDATE HCL ER (OSM) 36 MG PO TBCR: 36.0000 mg | EXTENDED_RELEASE_TABLET | Freq: Every day | ORAL | 0 refills | Status: DC

## 2022-01-19 MED ORDER — ESCITALOPRAM OXALATE 10 MG PO TABS: 10.0000 mg | ORAL_TABLET | Freq: Every day | ORAL | 5 refills | Status: DC

## 2022-01-19 NOTE — Progress Notes (Signed)
Subjective:    Patient ID: Amy Barry, female    DOB: 1970-07-10, 51 y.o.   MRN: [id number]  HPI  Amy Barry is here in follow up of her posterior fossa SAH. She has done well with concerta for the most part except for some differences between brands. She has had fewer catatonic spells. She may have 1 per day. She still isn't getting out of her room. She feels too tired to do anything and feels fatigued when she does anything. She tends to have episodes when she becomes fatigued as well. There is also apprehension about being up by herself due to fear of an accident.  Her headaches have been better for the most part. She admits to anxiety and depression.   She has been up and ambulating on her own, even doing stairs, but it's not conistent.       Pain Inventory Average Pain 0 Pain Right Now 0 My pain is  no pain  BOWEL Number of stools per week: 7   BLADDER Normal   Mobility use a [place] ability to climb steps?  yes do you drive?  no  Function disabled: date disabled .  Neuro/Psych trouble walking  Prior Studies Any changes since last visit?  no  Physicians involved in your care Any changes since last visit?  no   Family History  Problem Relation Age of Onset   Hypertension Mother        hx aneursym and surgery   Atrial fibrillation Mother    Sarcoidosis Mother    Anuerysm Mother        brain   Arthritis Mother    Healthy Father    Asthma Sister    Leukemia Maternal Grandmother    Heart disease Maternal Grandmother    Stroke Paternal Grandmother    Hypertension Daughter    Colon polyps Daughter 82       hermatoma oversized polyps-benign   Hypertension Daughter    Hyperlipidemia Other    Colon cancer Neg Hx    Social History   Socioeconomic History   Marital status: Married    Spouse name: Not on file   Number of children: 2   Years of education: College   Highest education level: Not on file  Occupational History   Occupation: Programmer, 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   Right-handed.   Occasional caffeine use.   Social Determinants of Health   Financial Resource Strain: Not on file  Food Insecurity: Not on file  Transportation Needs: Not on file  Physical Activity: Not on file  Stress: Not on file  Social Connections: Not on file   Past Surgical History:  Procedure Laterality Date   CERVICAL DISC ARTHROPLASTY N/A 10/23/2015   Procedure: Cervical Disc Arthroplasty Cervical six-seven;  Surgeon: Eustace Moore, MD;  Location: [facility];  Service: Neurosurgery;  Laterality: N/A;   D & C HYSTEROSCOPY W/ RESECTION ENDOMETRIAL POLYP  10-13-2003    dr rivard '@[facility]    ESOPHAGOGASTRODUODENOSCOPY  for GERD   IR ANGIO INTRA EXTRACRAN SEL COM CAROTID INNOMINATE BILAT MOD SED  07/06/2020   IR ANGIO INTRA EXTRACRAN SEL INTERNAL CAROTID BILAT MOD SED  06/29/2020   IR ANGIO VERTEBRAL SEL VERTEBRAL BILAT MOD SED  06/29/2020   IR ANGIO VERTEBRAL SEL VERTEBRAL BILAT MOD SED  07/06/2020   LAPAROSCOPIC ABDOMINAL EXPLORATION  1996   dx ibs   OVARIAN CYST SURGERY  age 55   x2 cyst   RADIOLOGY WITH ANESTHESIA N/A 06/29/2020   Procedure: IR WITH ANESTHESIA;  Surgeon: Radiologist, Medication, MD;  Location: [facility];  Service: Radiology;  Laterality: N/A;   ROBOTIC ASSISTED TOTAL HYSTERECTOMY N/A 12/13/2017   Procedure: XI ROBOTIC ASSISTED TOTAL HYSTERECTOMYWITH BILATERAL SALPINGECTOMY;  Surgeon: Christophe Louis, MD;  Location: [facility];  Service: Gynecology;  Laterality: N/A;   TUBAL LIGATION Bilateral 06-01-2002   dr Mancel Bale '@[facility]    PPTL   Past Medical History:  Diagnosis  Date   Anxiety    Arthralgia    Generalized weakness    bilateral upper and lower extremities,  walks with cane   GERD (gastroesophageal reflux disease)    Headache(784.0)    Heart murmur    per pt "since childhood, no problems"   History of kidney stones    Hypertension    followed pcp   IBS (irritable bowel syndrome)    Iron deficiency anemia    12-06-2017 per pt recently had IV iron infusion July 2019   Low vitamin D level    Osteoarthritis    pt unsure about this   PVC (premature ventricular contraction)    per pt had holter monitor   SLE (systemic lupus erythematosus) ([place])    rheumotologist-- dr syed   Stroke [facility]) 06/2020   Wears glasses    BP (!) 141/85   Pulse (!) 58   Wt 157 lb 12.8 oz (71.6 kg) Comment: last reported  LMP 12/22/2017   SpO2 100%   BMI 29.82 kg/m   Opioid Risk Score:   Fall Risk Score:  `1  Depression screen PHQ 2/9     01/19/2022   11:40 AM 11/10/2021    4:02 PM 08/18/2021    2:57 PM 05/05/2021    3:12 PM 02/10/2021    2:44 PM 11/04/2020    1:23 PM 08/05/2020   11:43 AM  Depression screen PHQ 2/9  Decreased Interest 0 0 0 1 0 0 0  Down, Depressed, Hopeless 0 0 0 1 0 0 0  PHQ - 2 Score 0 0 0 2 0 0 0     Review of Systems  Constitutional: Negative.   HENT: Negative.    Eyes: Negative.   Respiratory: Negative.    Cardiovascular: Negative.   Gastrointestinal: Negative.   Endocrine: Negative.   Genitourinary: Negative.   Musculoskeletal: Negative.   Skin: Negative.   Allergic/Immunologic: Negative.   Neurological:  Positive for speech difficulty.  Hematological: Negative.   Psychiatric/Behavioral:         Having an episode of not being able to move or speak (is resolving)  All other systems reviewed and are negative.      Objective:   Physical Exam  General: No acute distress HEENT: NCAT, EOMI, oral membranes moist Cards: reg rate  Chest: normal effort Abdomen: Soft, NT, ND Skin: dry, intact Extremities: no edema Psych:  pleasant and appropriate, more engaging after initially being flat  Neuro: farily alert and oriented x 3. fair insight and awareness.  dramatic improvement in memory and concentration.  Normal language and speech. Cranial nerve exam unremarkable. UE nearly 4/5. LE: 3 to 4/5 distally.  Slight left weakness more than right. Walks with steppage gait Musculoskeletal: left shoulder easily ranged to 90 abductioin and in ER/iR.           Assessment & Plan:  1.  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             -needs to be more active!                              2.  Left shoulder pain, bicipital tendonitis. :              - ice TID.              -outpt OT completed with substantial improvement in her left shoulder range of motion and pain             -topical modalities for myalgias, Robaxin  as needed             -hep.             3.  Seizure prophylaxis: keppra '500mg'$  bid---  4.  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  Migraine/vascular headaches---have improved in general             -topamax '100mg'$  bid--stay with this for now             -Maxalt 5 mg 1 to 2 tablets daily as needed for migraine headaches                          -Refilled today             -vision/anxiety component             -relaxation/rest when needed             -seems to be doing better when arousal is intact 6. Anxiety and depression, fatigue             -increase concerta to '36mg'$  daily             -lexapro increase to '10mg'$  qhs--as I previously suggested             -regular sleep               -family/social supports  -discussed need to get out of house, increase physical activity             -neuropsych referral was made and pending             -social reacclimation still needed             -melatoninn '10mg'$    -ordered a battery of labs to investigate fatigue    20 minutes of face to  face patient care time were spent during this visit. All questions were encouraged and answered.  Follow up with me in 2 mos .

## 2022-01-29 ENCOUNTER — Other Ambulatory Visit: Payer: Self-pay | Admitting: Physical Medicine & Rehabilitation

## 2022-01-29 DIAGNOSIS — I609 Nontraumatic subarachnoid hemorrhage, unspecified: Secondary | ICD-10-CM

## 2022-02-09 ENCOUNTER — Ambulatory Visit: Payer: 59 | Admitting: Physical Medicine & Rehabilitation

## 2022-03-09 ENCOUNTER — Telehealth: Payer: Self-pay

## 2022-03-09 DIAGNOSIS — I609 Nontraumatic subarachnoid hemorrhage, unspecified: Secondary | ICD-10-CM

## 2022-03-09 DIAGNOSIS — R5383 Other fatigue: Secondary | ICD-10-CM

## 2022-03-09 MED ORDER — METHYLPHENIDATE HCL ER (OSM) 36 MG PO TBCR: 36.0000 mg | EXTENDED_RELEASE_TABLET | Freq: Every day | ORAL | 0 refills | Status: DC

## 2022-03-09 NOTE — Telephone Encounter (Signed)
Pt requesting refill on methylphenidate per pmp last fill  Filled  Written  ID  Drug  QTY  Days  Prescriber  RX #  Dispenser  Refill  Daily Dose*  Pymt Type  PMP  01/19/2022 01/19/2022 3  Methylphenidate Er 36 Mg Tab 30.00 30 Za Swa [id number] Nor ([id number]) 0/0  Private Pay [place]

## 2022-03-09 NOTE — Telephone Encounter (Signed)
Rx written and sent to the pharmacy. Thanks!  

## 2022-03-25 ENCOUNTER — Other Ambulatory Visit: Payer: Self-pay

## 2022-03-25 ENCOUNTER — Encounter (HOSPITAL_BASED_OUTPATIENT_CLINIC_OR_DEPARTMENT_OTHER): Payer: Self-pay | Admitting: Emergency Medicine

## 2022-03-25 ENCOUNTER — Emergency Department (HOSPITAL_BASED_OUTPATIENT_CLINIC_OR_DEPARTMENT_OTHER): Payer: Self-pay

## 2022-03-25 ENCOUNTER — Emergency Department (HOSPITAL_BASED_OUTPATIENT_CLINIC_OR_DEPARTMENT_OTHER)
Admission: EM | Admit: 2022-03-25 | Discharge: 2022-03-26 | Disposition: A | Discharge status: Home / Self Care | Payer: Self-pay | Attending: Emergency Medicine | Admitting: Emergency Medicine

## 2022-03-25 DIAGNOSIS — R7309 Other abnormal glucose: Secondary | ICD-10-CM | POA: Insufficient documentation

## 2022-03-25 DIAGNOSIS — I10 Essential (primary) hypertension: Secondary | ICD-10-CM | POA: Insufficient documentation

## 2022-03-25 DIAGNOSIS — R4701 Aphasia: Secondary | ICD-10-CM | POA: Insufficient documentation

## 2022-03-25 DIAGNOSIS — R4782 Fluency disorder in conditions classified elsewhere: Secondary | ICD-10-CM

## 2022-03-25 DIAGNOSIS — F061 Catatonic disorder due to known physiological condition: Secondary | ICD-10-CM | POA: Insufficient documentation

## 2022-03-25 LAB — DIFFERENTIAL
Abs Immature Granulocytes: 0.01 10*3/uL (ref 0.00–0.07)
Basophils Absolute: 0 10*3/uL (ref 0.0–0.1)
Basophils Relative: 0 %
Eosinophils Absolute: 0.1 10*3/uL (ref 0.0–0.5)
Eosinophils Relative: 1 %
Immature Granulocytes: 0 %
Lymphocytes Relative: 34 %
Lymphs Abs: 2.1 10*3/uL (ref 0.7–4.0)
Monocytes Absolute: 0.5 10*3/uL (ref 0.1–1.0)
Monocytes Relative: 8 %
Neutro Abs: 3.5 10*3/uL (ref 1.7–7.7)
Neutrophils Relative %: 57 %

## 2022-03-25 LAB — CBC
HCT: 41.3 % (ref 36.0–46.0)
Hemoglobin: 13.5 g/dL (ref 12.0–15.0)
MCH: 32.1 pg (ref 26.0–34.0)
MCHC: 32.7 g/dL (ref 30.0–36.0)
MCV: 98.3 fL (ref 80.0–100.0)
Platelets: 242 10*3/uL (ref 150–400)
RBC: 4.2 MIL/uL (ref 3.87–5.11)
RDW: 12.1 % (ref 11.5–15.5)
WBC: 6.2 10*3/uL (ref 4.0–10.5)
nRBC: 0 % (ref 0.0–0.2)

## 2022-03-25 LAB — COMPREHENSIVE METABOLIC PANEL
ALT: 8 U/L (ref 0–44)
AST: 12 U/L — ABNORMAL LOW (ref 15–41)
Albumin: 4.7 g/dL (ref 3.5–5.0)
Alkaline Phosphatase: 52 U/L (ref 38–126)
Anion gap: 10 (ref 5–15)
BUN: 15 mg/dL (ref 6–20)
CO2: 24 mmol/L (ref 22–32)
Calcium: 9.8 mg/dL (ref 8.9–10.3)
Chloride: 106 mmol/L (ref 98–111)
Creatinine, Ser: 0.82 mg/dL (ref 0.44–1.00)
GFR, Estimated: >60 mL/min (ref >60)
Glucose, Bld: 75 mg/dL (ref 70–99)
Potassium: 3.7 mmol/L (ref 3.5–5.1)
Sodium: 140 mmol/L (ref 135–145)
Total Bilirubin: 0.2 mg/dL — ABNORMAL LOW (ref 0.3–1.2)
Total Protein: 8 g/dL (ref 6.5–8.1)

## 2022-03-25 LAB — PROTIME-INR
INR: 1.2 (ref 0.8–1.2)
Prothrombin Time: 15.5 seconds — ABNORMAL HIGH (ref 11.4–15.2)

## 2022-03-25 LAB — ETHANOL: Alcohol, Ethyl (B): <10 mg/dL (ref <10)

## 2022-03-25 LAB — CBG MONITORING, ED: Glucose-Capillary: 94 mg/dL (ref 70–99)

## 2022-03-25 LAB — APTT: aPTT: 24 seconds (ref 24–36)

## 2022-03-25 MED ORDER — ACETAMINOPHEN 500 MG PO TABS
1000.0000 mg | ORAL_TABLET | Freq: Once | ORAL | Status: AC
  Administered 2022-03-25: 1000 mg via ORAL
  Filled 2022-03-25: qty 2

## 2022-03-25 MED ORDER — SODIUM CHLORIDE 0.9% FLUSH: 3.0000 mL | Freq: Once | INTRAVENOUS | Status: DC

## 2022-03-25 MED ORDER — PROCHLORPERAZINE EDISYLATE 10 MG/2ML IJ SOLN
10.0000 mg | Freq: Once | INTRAMUSCULAR | Status: AC
  Administered 2022-03-25: 10 mg via INTRAVENOUS
  Filled 2022-03-25: qty 2

## 2022-03-25 MED ORDER — LORAZEPAM 2 MG/ML IJ SOLN
2.0000 mg | Freq: Once | INTRAMUSCULAR | Status: AC
  Administered 2022-03-25: 2 mg via INTRAVENOUS
  Filled 2022-03-25: qty 1

## 2022-03-25 NOTE — Discharge Instructions (Signed)
You were seen in the emergency department for your difficulty speaking.  Your workup showed no signs of new stroke or bleeding in your brain and your electrolytes were all normal.  We gave you Ativan for your symptoms and your symptoms improved.  You likely have something called catatonia which limits your ability to move and speak normally.  You can follow-up with your primary doctor or your rehab doctor to have your symptoms rechecked.  You should return to the emergency department if you have significantly worsening headaches, numbness or weakness on one side of the body compared to the other or if you have any other new or concerning symptoms.

## 2022-03-25 NOTE — ED Provider Notes (Signed)
[facility] EMERGENCY DEPT Provider Note   CSN: [id number] Arrival date & time: 03/25/22  1859     History  Chief Complaint  Patient presents with   Aphasia    Amy Barry is a 51 y.o. female.  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  Patient's husband states that her last known well was about 2 days ago.  He states that she frequently has intermittent episodes of aphasia and "tensing up" of her arms.  He states that normally these episodes lasts minutes or up to an hour however he states that she has been unable to speak clearly for the last 2 days.  Patient also reports a right-sided headache.  He states normally her headaches are on the left.  She reports nausea but denies any vomiting.  She denies any numbness or weakness.  She denies any recent fevers or infectious symptoms.  The history is provided by the patient and the spouse.       Home Medications Prior to Admission medications   Medication Sig Start Date End Date Taking? Authorizing Provider  acetaminophen (TYLENOL) 500 MG tablet Take 500 mg by mouth every 6 (six) hours as needed.    [provider]  Acetaminophen-Codeine 300-30 MG tablet Take 1 tablet by mouth every 6 (six) hours as needed for pain. 04/22/21   Meredith Staggers, MD  amLODipine (NORVASC) 5 MG tablet Take 5 mg by mouth daily. 06/03/20   [provider]  diclofenac Sodium (VOLTAREN) 1 % GEL Apply 2 g topically 4 (four) times daily. 07/23/20   Love, Ivan Anchors, PA-C  escitalopram (LEXAPRO) 10 MG tablet Take 1 tablet (10 mg total) by mouth daily. 01/19/22   Meredith Staggers, MD  folic acid (FOLVITE) 1 MG tablet Take 1 mg by mouth daily. 03/12/19   [provider]  furosemide (LASIX) 40 MG tablet Take 40 mg by mouth daily. 08/30/20   [provider]  KLOR-CON  M20 20 MEQ tablet Take 20 mEq by mouth daily. 03/09/21   [provider]  Lansoprazole (PREVACID PO)     [provider]  levETIRAcetam (KEPPRA) 500 MG tablet TAKE 1 TABLET BY MOUTH TWICE A DAY 01/31/22   Meredith Staggers, MD  Melatonin 10 MG CAPS Take by mouth daily.    [provider]  Menthol-Methyl Salicylate (MENTHODERM) 10-15 % OINT     [provider]  methocarbamol (ROBAXIN) 500 MG tablet TAKE 1 TABLET BY MOUTH FOUR TIMES A DAY AS NEEDED 11/23/21   Meredith Staggers, MD  methylphenidate (CONCERTA) 36 MG PO CR tablet Take 1 tablet (36 mg total) by mouth daily. 03/09/22 03/09/23  Meredith Staggers, MD  methylphenidate (CONCERTA) 36 MG PO CR tablet Take 1 tablet (36 mg total) by mouth daily. 03/09/22 03/09/23  Meredith Staggers, MD  metoprolol (LOPRESSOR) 100 MG tablet TAKE ONE TABLET BY MOUTH TWICE DAILY Patient taking differently: Take 100 mg by mouth 2 (two) times daily. 12/31/14   Laurey Morale, MD  omeprazole (PRILOSEC) 40 MG capsule daily. 08/10/20   [provider]  ondansetron (ZOFRAN) 4 MG tablet 1 tablet    [provider]  pantoprazole (PROTONIX) 40 MG tablet Take 40 mg by mouth daily as needed (for acid reflux/indigestion).    [provider]  polyethylene glycol powder (GLYCOLAX/MIRALAX) 17 GM/SCOOP powder Takes PRN  [provider]  Probiotic CHEW See admin instructions.    [provider]  rizatriptan (MAXALT) 5 MG tablet Take 1 tablet (5 mg total) by mouth once as needed for migraine. May repeat in 2 hours if needed 11/10/21 11/11/22  Meredith Staggers, MD  saccharomyces boulardii (FLORASTOR) 250 MG capsule Take 1 capsule (250 mg total) by mouth 2 (two) times daily. Patient taking differently: Take 250 mg by mouth 2 (two) times daily. Takes daily only 07/22/20   Love, Ivan Anchors, PA-C  topiramate (TOPAMAX) 100 MG tablet TAKE 1 TABLET BY MOUTH TWICE A DAY 01/19/22   Meredith Staggers, MD  traZODone (DESYREL)  50 MG tablet TAKE 1 TABLET BY MOUTH EVERYDAY AT BEDTIME 09/27/21   Meredith Staggers, MD  VERAPAMIL HCL PO     [provider]      Allergies    Sumatriptan, Duloxetine hcl, Meperidine, Meperidine hcl, Metoclopramide, Oxycodone, Oxycodone-acetaminophen, Prozac [fluoxetine], Tramadol, Tramadol hcl, Codeine, and Propoxyphene n-acetaminophen    Review of Systems   Review of Systems  Physical Exam Updated Vital Signs BP 118/71   Pulse (!) 57   Temp 97.9 F (36.6 C) (Oral)   Resp 11   Ht '5\' 1"'$  (1.549 m)   Wt 65.8 kg   LMP 12/22/2017   SpO2 100%   BMI 27.40 kg/m  Physical Exam Vitals and nursing note reviewed.  Constitutional:      General: She is not in acute distress.    Appearance: Normal appearance.  HENT:     Head: Normocephalic and atraumatic.     Nose: Nose normal.     Mouth/Throat:     Mouth: Mucous membranes are moist.     Pharynx: Oropharynx is clear.  Eyes:     Extraocular Movements: Extraocular movements intact.     Conjunctiva/sclera: Conjunctivae normal.     Pupils: Pupils are equal, round, and reactive to light.  Cardiovascular:     Rate and Rhythm: Normal rate and regular rhythm.     Pulses: Normal pulses.     Heart sounds: Normal heart sounds.  Pulmonary:     Effort: Pulmonary effort is normal.     Breath sounds: Normal breath sounds.  Abdominal:     General: Abdomen is flat.     Palpations: Abdomen is soft.     Tenderness: There is no abdominal tenderness.  Musculoskeletal:        General: No swelling or deformity. Normal range of motion.     Cervical back: Normal range of motion and neck supple.  Skin:    General: Skin is warm and dry.  Neurological:     Mental Status: She is alert and oriented to person, place, and time.     Comments: Stuttering speech, difficult to understand, intermittently able to say a word or two clearly No facial droop Moving all 4 extremities though left hand is tense and stiff  Psychiatric:        Mood and  Affect: Mood normal.        Behavior: Behavior normal.     ED Results / Procedures / Treatments   Labs (all labs ordered are listed, but only abnormal results are displayed) Labs Reviewed  PROTIME-INR - Abnormal; Notable for the following components:      Result Value   Prothrombin Time 15.5 (*)    All other components within normal limits  COMPREHENSIVE METABOLIC PANEL - Abnormal; Notable for the following components:   AST 12 (*)  Total Bilirubin 0.2 (*)    All other components within normal limits  APTT  CBC  DIFFERENTIAL  ETHANOL  URINALYSIS, ROUTINE W REFLEX MICROSCOPIC  CBG MONITORING, ED    EKG EKG Interpretation  Date/Time:  Friday March 25 2022 19:14:12 EST Ventricular Rate:  64 PR Interval:  168 QRS Duration: 68 QT Interval:  350 QTC Calculation: 361 R Axis:   70 Text Interpretation: Normal sinus rhythm Nonspecific ST and T wave abnormality Abnormal ECG When compared with ECG of 10-Jul-2020 11:40, Nonspecific T wave abnormality no longer evident in Anterior leads T wave inversion now evident in Lateral leads Interpretation limited secondary to artifact Confirmed by Leanord Asal ([id number]) on 03/25/2022 8:25:56 PM  Radiology CT HEAD WO CONTRAST  Result Date: 03/25/2022 CLINICAL DATA:  Aphasia EXAM: CT HEAD WITHOUT CONTRAST TECHNIQUE: Contiguous axial images were obtained from the base of the skull through the vertex without intravenous contrast. RADIATION DOSE REDUCTION: This exam was performed according to the departmental dose-optimization program which includes automated exposure control, adjustment of the mA and/or kV according to patient size and/or use of iterative reconstruction technique. COMPARISON:  07/21/2020 FINDINGS: Brain: Persistent hypodensity is noted in the right frontal lobe stable in appearance from the prior exam related to prior catheter placement. No acute hemorrhage, acute infarction or space-occupying mass lesion is noted. Vascular: No  hyperdense vessel or unexpected calcification. Skull: Normal. Negative for fracture or focal lesion. Sinuses/Orbits: No acute finding. Other: None. IMPRESSION: Stable appearance when compare with the prior exam. No acute abnormality noted. Electronically Signed   By: Inez Catalina M.D.   On: 03/25/2022 20:14    Procedures Procedures    Medications Ordered in ED Medications  sodium chloride flush (NS) 0.9 % injection 3 mL (3 mLs Intravenous Not Given 03/25/22 1938)  acetaminophen (TYLENOL) tablet 1,000 mg (1,000 mg Oral Given 03/25/22 2004)  prochlorperazine (COMPAZINE) injection 10 mg (10 mg Intravenous Given 03/25/22 2005)  LORazepam (ATIVAN) injection 2 mg (2 mg Intravenous Given 03/25/22 2102)    ED Course/ Medical Decision Making/ A&P                           Medical Decision Making This patient presents to the ED with chief complaint(s) of aphasia with pertinent past medical history of SAH with catatonia, HTN, migraines which further complicates the presenting complaint. The complaint involves an extensive differential diagnosis and also carries with it a high risk of complications and morbidity.    The differential diagnosis includes ICH, mass effect, CVA, catatonia, electrolyte abnormality, complex migraine  Additional history obtained: Additional history obtained from family Records reviewed PMR records  ED Course and Reassessment: Patient was aphasic with difficulty speaking clearly on arrival with stiffness to her left hand.  Her last known well was 2 days ago so she was not made a stroke alert alert and was undergoing subacute stroke workup.  She is additionally complaining of a headache and will be treated with a migraine cocktail with possible complex migraine.  Per her PMR records she does have episodes of catatonia and considering possible catatonia and she will be treated with Ativan.  Independent labs interpretation:  The following labs were independently interpreted:  Within normal range  Independent visualization of imaging: - I independently visualized the following imaging with scope of interpretation limited to determining acute life threatening conditions related to emergency care: CT head, which revealed no acute disease  Consultation: - Consulted or discussed management/test  interpretation w/ external professional: N/A  Consideration for admission or further workup: Patient has no emergent conditions requiring admission or further work-up at this time and is stable for discharge home with primary care follow-up  Social Determinants of health: N/A    Amount and/or Complexity of Data Reviewed Labs: ordered. Radiology: ordered.  Risk OTC drugs. Prescription drug management.          Final Clinical Impression(s) / ED Diagnoses Final diagnoses:  Catatonia  Fluency disorder associated with underlying disease    Rx / DC Orders ED Discharge Orders     None         Kemper Durie, DO 03/25/22 2343

## 2022-03-25 NOTE — ED Triage Notes (Signed)
Pt arrives to ED with c/o aphasia and trouble speaking. Family reports this started yesterday morning and her last known normal was 12/20 at bedtime. Hx stroke. Family reports she has had episodes of asaphia intermittently since her last stroke.

## 2022-03-25 NOTE — ED Notes (Signed)
Pt ambulatory w unsteady gait using Mukai, 2 person assist. Pt assisted back to bed, EDP advised of same. Will attempt to re-ambulate pt in approx 1hr

## 2022-03-25 NOTE — ED Notes (Signed)
Patient transported to CT 

## 2022-03-26 ENCOUNTER — Other Ambulatory Visit: Payer: Self-pay | Admitting: Physical Medicine & Rehabilitation

## 2022-04-08 ENCOUNTER — Telehealth: Payer: Self-pay | Admitting: Physical Medicine & Rehabilitation

## 2022-04-08 NOTE — Telephone Encounter (Signed)
Patient needs a refill on Methylphenidate sent to her pharmacy [facility] [address].

## 2022-04-08 NOTE — Telephone Encounter (Signed)
Pt.notified

## 2022-04-08 NOTE — Telephone Encounter (Signed)
She has a DNF for 1/4 already sent last month

## 2022-04-13 ENCOUNTER — Encounter: Payer: 59 | Attending: Physical Medicine & Rehabilitation | Admitting: Physical Medicine & Rehabilitation

## 2022-04-13 ENCOUNTER — Encounter: Payer: Self-pay | Admitting: Physical Medicine & Rehabilitation

## 2022-04-13 VITALS — BP 123/61 | HR 67 | Ht 61.0 in | Wt 158.0 lb

## 2022-04-13 DIAGNOSIS — I609 Nontraumatic subarachnoid hemorrhage, unspecified: Secondary | ICD-10-CM

## 2022-04-13 DIAGNOSIS — R41844 Frontal lobe and executive function deficit: Secondary | ICD-10-CM | POA: Diagnosis not present

## 2022-04-13 DIAGNOSIS — R69 Illness, unspecified: Secondary | ICD-10-CM | POA: Diagnosis not present

## 2022-04-13 DIAGNOSIS — F329 Major depressive disorder, single episode, unspecified: Secondary | ICD-10-CM | POA: Insufficient documentation

## 2022-04-13 DIAGNOSIS — F411 Generalized anxiety disorder: Secondary | ICD-10-CM

## 2022-04-13 MED ORDER — ESCITALOPRAM OXALATE 20 MG PO TABS: 20.0000 mg | ORAL_TABLET | Freq: Every day | ORAL | 4 refills | Status: DC

## 2022-04-13 MED ORDER — PANTOPRAZOLE SODIUM 40 MG PO TBEC: 40.0000 mg | DELAYED_RELEASE_TABLET | Freq: Every day | ORAL | 7 refills | Status: DC

## 2022-04-13 NOTE — Progress Notes (Signed)
Subjective:    Patient ID: Amy Barry, female    DOB: Aug 12, 1970, 52 y.o.   MRN: [id number]  HPI  Amy Barry is here in follow up of her [facility]. She was in the ED with aphasia on 03/25/22. W/u was negative sx subsided. She has been speaking in foreign-jibberish type of speech. It is some times intelligible when she slows down. Her husband notices that it worsens when she becomes anxious or a stress comes on. It has been noticeably more prominent today prior to this visit.  To her credit Amy Barry has been getting out of her bedroom more, walking around the house with her Rolon, doing a few house chores. She still is generally unwilling to do anything outside the home despite encouragement from her family. Amy Barry does have a visit with Dr. Sima Matas scheduled for February.   She remains on concerta for arousal/initiation which has helped her. We recently increased her lexapro to '10mg'$  daily. She also uses trazodone for sleep. She remains on keppra for sz prophylaxis and topamax for headache control.     Pain Inventory Average Pain 6 Pain Right Now 5 My pain is aching  LOCATION OF PAIN  head  BOWEL Number of stools per week: 3 Oral laxative use No  Type of laxative . Enema or suppository use No  History of colostomy No  Incontinent No   BLADDER Normal In and out cath, frequency . Able to self cath  . Bladder incontinence No  Frequent urination No  Leakage with coughing No  Difficulty starting stream No  Incomplete bladder emptying No    Mobility walk without assistance walk with assistance use a Vanes ability to climb steps?  yes do you drive?  no  Function disabled: date disabled   I need assistance with the following:  meal prep, household duties, and shopping  Neuro/Psych weakness numbness tingling trouble walking dizziness confusion  Prior Studies Any changes since last visit?  no  Physicians involved in your care Any changes since last visit?  no   Family  History  Problem Relation Age of Onset   Hypertension Mother        hx aneursym and surgery   Atrial fibrillation Mother    Sarcoidosis Mother    Anuerysm Mother        brain   Arthritis Mother    Healthy Father    Asthma Sister    Leukemia Maternal Grandmother    Heart disease Maternal Grandmother    Stroke Paternal Grandmother    Hypertension Daughter    Colon polyps Daughter 9       hermatoma oversized polyps-benign   Hypertension Daughter    Hyperlipidemia Other    Colon cancer Neg Hx    Social History   Socioeconomic History   Marital status: Married    Spouse name: Not on file   Number of children: 2   Years of education: College   Highest education level: Not on file  Occupational History   Occupation: Programmer, multimedia: [employer]  Tobacco Use   Smoking status: Never   Smokeless tobacco: Never  Vaping Use   Vaping Use: Never used  Substance and Sexual Activity   Alcohol use: No    Alcohol/week: 0.0 standard drinks of alcohol   Drug use: Never   Sexual activity: Yes    Partners: Male    Birth control/protection: Surgical  Other Topics Concern   Not on file  Social History Narrative  Lives at home with husband.   Right-handed.   Occasional caffeine use.   Social Determinants of Health   Financial Resource Strain: Not on file  Food Insecurity: Not on file  Transportation Needs: Not on file  Physical Activity: Not on file  Stress: Not on file  Social Connections: Not on file   Past Surgical History:  Procedure Laterality Date   CERVICAL DISC ARTHROPLASTY N/A 10/23/2015   Procedure: Cervical Disc Arthroplasty Cervical six-seven;  Surgeon: Eustace Moore, MD;  Location: [facility];  Service: Neurosurgery;  Laterality: N/A;   D & C HYSTEROSCOPY W/ RESECTION ENDOMETRIAL POLYP  10-13-2003    dr rivard '@[facility]    ESOPHAGOGASTRODUODENOSCOPY     for GERD   IR ANGIO INTRA EXTRACRAN SEL COM CAROTID INNOMINATE BILAT MOD SED  07/06/2020   IR ANGIO INTRA  EXTRACRAN SEL INTERNAL CAROTID BILAT MOD SED  06/29/2020   IR ANGIO VERTEBRAL SEL VERTEBRAL BILAT MOD SED  06/29/2020   IR ANGIO VERTEBRAL SEL VERTEBRAL BILAT MOD SED  07/06/2020   LAPAROSCOPIC ABDOMINAL EXPLORATION  1996   dx ibs   OVARIAN CYST SURGERY  age 18   x2 cyst   RADIOLOGY WITH ANESTHESIA N/A 06/29/2020   Procedure: IR WITH ANESTHESIA;  Surgeon: Radiologist, Medication, MD;  Location: [facility];  Service: Radiology;  Laterality: N/A;   ROBOTIC ASSISTED TOTAL HYSTERECTOMY N/A 12/13/2017   Procedure: XI ROBOTIC ASSISTED TOTAL HYSTERECTOMYWITH BILATERAL SALPINGECTOMY;  Surgeon: Christophe Louis, MD;  Location: [facility];  Service: Gynecology;  Laterality: N/A;   TUBAL LIGATION Bilateral 06-01-2002   dr Mancel Bale '@[facility]    PPTL   Past Medical History:  Diagnosis Date   Anxiety    Arthralgia    Generalized weakness    bilateral upper and lower extremities,  walks with cane   GERD (gastroesophageal reflux disease)    Headache(784.0)    Heart murmur    per pt "since childhood, no problems"   History of kidney stones    Hypertension    followed pcp   IBS (irritable bowel syndrome)    Iron deficiency anemia    12-06-2017 per pt recently had IV iron infusion July 2019   Low vitamin D level    Osteoarthritis    pt unsure about this   PVC (premature ventricular contraction)    per pt had holter monitor   SLE (systemic lupus erythematosus) ([place])    rheumotologist-- dr syed   Stroke [facility]) 06/2020   Wears glasses    BP 123/61   Pulse 67   Ht '5\' 1"'$  (1.549 m)   Wt 71.7 kg   LMP 12/22/2017   SpO2 98%   BMI 29.85 kg/m   Opioid Risk Score:   Fall Risk Score:  `1  Depression screen PHQ 2/9     01/19/2022   11:40 AM 11/10/2021    4:02 PM 08/18/2021    2:57 PM 05/05/2021    3:12 PM 02/10/2021    2:44 PM 11/04/2020    1:23 PM 08/05/2020   11:43 AM  Depression screen PHQ 2/9  Decreased Interest 0 0 0 1 0 0 0  Down, Depressed, Hopeless 0 0 0 1 0 0 0  PHQ - 2 Score 0 0 0 2 0 0 0      Review of  Systems  Gastrointestinal:  Positive for nausea.  Neurological:  Positive for dizziness, weakness and numbness.  All other systems reviewed and are negative.      Objective:   Physical Exam  General: No acute distress HEENT: NCAT, EOMI, oral membranes moist Cards: reg rate  Chest: normal effort Abdomen: Soft, NT, ND Skin: dry, intact Extremities: no edema Psych: appears anxious, is cooperative.  Neuro: alert and oriented with cues. Her speech is varies from a dysarthric baby-talk to an african dialect to an occasional french dialect. It's more intelligible when she slows down. Fair to borderline insight and awareness.   Normal language and speech. Cranial nerve exam unremarkable. UE nearly 4/5. LE: 3 to 4/5 distally.  Slight left weakness more than right. Walks with steppage gait Musculoskeletal: ambulates with right hip deviated laterally. Can come back to more neutral position with cueing. Uses Lye fairly well for balance but did use it to help with initial part of transfers..           Assessment & Plan:  1.  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             -since I last saw her has developed deviations in speech noted above in physical exam where it's a combination of dysarthric baby talk to french/african dialect. I explained to Amy Barry and her husband that this speech was a manifestation of her anxiety and depression. I think husband already realizes that. We need to come up with further treatment to deal with the underlying problem. (See #6)                              2.  Left shoulder pain, bicipital tendonitis:              - ice TID.              -hep.             3.  Seizure prophylaxis: keppra '500mg'$  bid---  4.  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  Migraine/vascular headaches---have improved in general             -topamax '100mg'$   bid--continue             -Maxalt 5 mg 1 to 2 tablets daily as needed for migraine headaches                          -Refilled today             -vision/anxiety component             -relaxation/rest when needed              6. Anxiety and depression, fatigue/sleep             -continue concerta '36mg'$  daily             -lexapro- increase to '20mg'$  qhs-              -regular sleep has been discussed. Continue trazodone             -family/social supports are still there             -continue reiterate need for social re-acclimation             -neuropsych visit is scheduled for 2/26. Dr. Ferne Coe input will be valuable               melatonin '10mg'$              -  she never got the tests ordered done as apparently they arrived too late. Will hold off for now on re-ordering as her fatigue is not the biggest issue currently      38 minutes of face to face patient care time were spent during this visit. All questions were encouraged and answered.  Follow up with me in 4 mos .

## 2022-04-13 NOTE — Patient Instructions (Signed)
ALWAYS FEEL FREE TO CALL OUR OFFICE WITH ANY PROBLEMS OR QUESTIONS ([phone])  **PLEASE NOTE** ALL MEDICATION REFILL REQUESTS (INCLUDING CONTROLLED SUBSTANCES) NEED TO BE MADE AT LEAST 7 DAYS PRIOR TO REFILL BEING DUE. ANY REFILL REQUESTS INSIDE THAT TIME FRAME MAY RESULT IN DELAYS IN RECEIVING YOUR PRESCRIPTION.                    

## 2022-04-30 ENCOUNTER — Other Ambulatory Visit: Payer: Self-pay | Admitting: Physical Medicine & Rehabilitation

## 2022-04-30 DIAGNOSIS — I609 Nontraumatic subarachnoid hemorrhage, unspecified: Secondary | ICD-10-CM

## 2022-05-04 ENCOUNTER — Other Ambulatory Visit: Payer: Self-pay | Admitting: Physical Medicine & Rehabilitation

## 2022-05-11 ENCOUNTER — Telehealth: Payer: Self-pay | Admitting: Physical Medicine & Rehabilitation

## 2022-05-11 DIAGNOSIS — I609 Nontraumatic subarachnoid hemorrhage, unspecified: Secondary | ICD-10-CM

## 2022-05-11 DIAGNOSIS — R5383 Other fatigue: Secondary | ICD-10-CM

## 2022-05-11 NOTE — Telephone Encounter (Signed)
REQUESTING REFILL ON METHYLPHENIDATE.

## 2022-05-12 ENCOUNTER — Other Ambulatory Visit: Payer: Self-pay | Admitting: Physical Medicine & Rehabilitation

## 2022-05-12 DIAGNOSIS — F411 Generalized anxiety disorder: Secondary | ICD-10-CM

## 2022-05-12 DIAGNOSIS — F329 Major depressive disorder, single episode, unspecified: Secondary | ICD-10-CM

## 2022-05-12 MED ORDER — METHYLPHENIDATE HCL ER (OSM) 36 MG PO TBCR: 36.0000 mg | EXTENDED_RELEASE_TABLET | Freq: Every day | ORAL | 0 refills | Status: DC

## 2022-05-12 NOTE — Telephone Encounter (Signed)
Rx'es written for this month and next.

## 2022-05-26 ENCOUNTER — Other Ambulatory Visit: Payer: Self-pay | Admitting: Physical Medicine & Rehabilitation

## 2022-05-30 ENCOUNTER — Encounter: Payer: 59 | Attending: Physical Medicine & Rehabilitation | Admitting: Psychology

## 2022-05-30 DIAGNOSIS — F411 Generalized anxiety disorder: Secondary | ICD-10-CM

## 2022-05-30 DIAGNOSIS — R41844 Frontal lobe and executive function deficit: Secondary | ICD-10-CM | POA: Diagnosis not present

## 2022-05-30 DIAGNOSIS — R69 Illness, unspecified: Secondary | ICD-10-CM | POA: Diagnosis not present

## 2022-05-30 DIAGNOSIS — I609 Nontraumatic subarachnoid hemorrhage, unspecified: Secondary | ICD-10-CM | POA: Diagnosis not present

## 2022-05-30 DIAGNOSIS — R269 Unspecified abnormalities of gait and mobility: Secondary | ICD-10-CM | POA: Diagnosis not present

## 2022-05-30 NOTE — Progress Notes (Signed)
Neuropsychological Consultation   Patient:   Amy Barry   DOB:   13-Jan-1971  MR Number:  [id number]  Location:  [facility] [place], [place] [id number] [place] [address] Dept: [phone]           Date of Service:   05/30/2022  Location of Service and Individuals present: Today's visit was an in person visit that was conducted in my outpatient clinic office.  The patient, her husband and myself were present for this visit.  1 hour and 50 minutes was spent with this visit.  Start Time:   3 PM End Time:   5 PM  Patient Consent and Confidentiality: Patient signed and agreed to HIPAA regulations and confidentiality notice and agreed to have the current visit and agreed to have patient come in with her per her request.  Consent for Evaluation and Treatment:  Signed:  Yes Explanation of Privacy Policies:  Signed:  Yes Discussion of Confidentiality Limits:  Yes  Provider/Observer:  Ilean Skill, Psy.D.       Clinical Neuropsychologist       Billing Code/Service: [phone]  Chief Complaint:     Chief Complaint  Patient presents with   Memory Loss   Anxiety   Other    Expressive language deficits    Reason for Service:    Amy Barry is a 52 year old female referred for neuropsychological/psychological consult due to ongoing issues with cognitive functioning, expressive language functioning and anxiety and stress associated with post cerebrovascular event symptoms.  Patient has a past medical history that is included hypertension, irritable bowel syndrome, anemia, undetermined connective tissue disease disorder and family history of cerebral aneurysms.  Patient had been followed previous to her cerebrovascular accident by neurology because of changes and abnormalities in gait, weakness and headache.  Patient had an MRI conducted in 2021 prior to her cerebrovascular  accident with an interpretive impression of a normal exam with no changes noted prior to previous MRI in 2018.  Patient was admitted on 06/29/2020 after being found down by her daughter with slurred speech and confusion.  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  Hemorrhage with surrounding edema noted in right frontal lobe area.  Patient was treated on the inpatient rehabilitation unit where I saw her on 07/17/2020.  The patient has continued to have difficulties with memory with the primary memory difficulties appear to be related to retrieval of information rather than an inability to store or organize new information.  The patient has significant variations in expressive language with significant times of profound nonfluent aphasia and other times where she is able to speak much more clearly but continues to have articulation changes from prior to her cerebrovascular event.  The patient also has a significant amount of anxiety and stress associated with her difficulties.  The patient continues to have motor deficits but these were present for the most part prior to this cerebrovascular accident related to her previous or pre-CVA neurological symptoms.  During the clinical interview today, the patient and her husband both acknowledged changes in speech, memory and vision.  The patient has had review by ophthalmology stating that there was not a lot they could do about her vision changes.  The patient herself reports that she is having difficulty remembering to do things and remembering what was talked about previously.  The patient  reports that she is trying to write things down and make lists but then forgets about the list itself.  The patient has worked with speech therapy around strategies to manage with her memory difficulties and sets alarms on their phones.  The patient reports that it does not work every day and she forgets about events.   Patient reports that she forgets to pass messages along to family members.  Patient describes significant speech with significant variability in capacity.  The patient reports that at baseline she does not talk like she did before and has an odd accident to her speech.  She reports that in her head that she talks like she has always talked but knows that it comes out differently than she is intending.  The patient reports that she has difficulty finding the words and executing the pronunciation of words and getting grammar correct.  There are other times where her speech becomes almost completely nonfluent.  Patient reports that noise and stress can trigger the symptoms and that others will notice sometimes a tear forming or she becomes very fidgety.  The patient reports that she becomes almost paralyzed and cannot do anything or say anything.  She even reports that there are times when the side of her face will pull.  She reports that these events last between 5 minutes to 1 hour.  Patient described a significant event this past December where she had a significant change in her speech where it went to "gibberish."  Patient reports that in her mind it sounds like she is speaking okay but others hear gibberish words.  Patient reports that she does not feel particularly anxious during these times and sometimes they can happen just out of the blue.  Patient reports that when things are quiet and calm it can be almost "normal."  Patient reports that for the first part after her CVA in 2022 that she spent most of her time in her room upstairs in their house where a humidifier was making a white noise.  The patient reports that she did better there but when she would come downstairs with noise and things like TV going that he will completely throw her off.  During the clinical interview today the patient's husband reports that she has made significant improvements and that the primary difference for now versus before  the stroke are these acute episodes with her speech and expressive language changes.  The patient's husband reports that there has been some loss of memory capacity but as described by the patient herself this appears to be more having to do with retrieval of information and an inability to store and organize new information.  The patient's husband reports that she is now doing things that initially were hindering her progress.  She is beginning to come down and spend more time with others and doing better the more time she spends with others.  The patient has begun fixing her own meals, dressing herself, keeping up with her medicines etc.  Patient's husband reports that cueing does help her with her memory but that she is constantly worrying that she might forget something or worrying about her speech.  Worry and self observation appear to exacerbate her expressive language.   Medical History:   Past Medical History:  Diagnosis Date   Anxiety    Arthralgia    Generalized weakness    bilateral upper and lower extremities,  walks with cane   GERD (gastroesophageal reflux disease)    Headache(784.0)  Heart murmur    per pt "since childhood, no problems"   History of kidney stones    Hypertension    followed pcp   IBS (irritable bowel syndrome)    Iron deficiency anemia    12-06-2017 per pt recently had IV iron infusion July 2019   Low vitamin D level    Osteoarthritis    pt unsure about this   PVC (premature ventricular contraction)    per pt had holter monitor   SLE (systemic lupus erythematosus) ([facility])    rheumotologist-- dr syed   Stroke [facility]) 06/2020   Wears glasses          Patient Active Problem List   Diagnosis Date Noted   Generalized anxiety disorder 05/05/2021   Biceps tendonitis on left 02/10/2021   Vascular headache 08/05/2020   Hyponatremia 07/28/2020   Headache 07/24/2020   Neck pain, musculoskeletal 07/24/2020   Cognitive deficits    Frontal lobe and executive  function deficit    Subarachnoid bleed ([place]) 07/09/2020   Subarachnoid hemorrhage ([facility]) 06/29/2020   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[facility]) 03/04/2019   S/P hysterectomy 12/13/2017   Abnormal laboratory test 09/09/2016   Gait abnormality 08/03/2016   Chronic fatigue 08/03/2016   Paresthesia 08/03/2016   S/P cervical spinal fusion 10/23/2015   Hypersomnia 12/08/2010   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   EPIGASTRIC PAIN 10/13/2009   IRRITABLE BOWEL SYNDROME 03/10/2009   BACK PAIN, LUMBAR 07/01/2008   COSTOCHONDRITIS 02/01/2008   VIRAL URI 01/08/2008   CHEST PAIN 01/08/2008   OTHER SPECIFIED EPISODIC MOOD DISORDER 12/25/2007   ARM PAIN 12/25/2007   ANEMIA-NOS 10/02/2007   Essential hypertension 10/02/2007   GERD 10/02/2007   POLYP, GALLBLADDER 10/02/2007   UTERINE POLYP 10/02/2007   OSTEOARTHRITIS 10/02/2007   Dyskinesia 10/02/2007   HEADACHE 10/02/2007   PALPITATIONS 10/02/2007   CARDIAC MURMUR, HX OF 10/02/2007   NEPHROLITHIASIS, HX OF 10/02/2007    Onset and Duration of Symptoms: The patient was having neurological issues even before CVA because of changes and abnormalities in gait, weakness and headache.  Expressive language deficits and memory changes developed after her 2022 CVA.  Progression of Symptoms: Patient has made improvements but continues to have acute events with significant profound changes in expressive language.  Triggering Factors: Noise and stress.  Associated Symptoms (e.g., cognitive, emotional, behavioral): Memory retrieval deficits, anxiety and hypervigilance and avoidance behaviors  Additional Tests and Measures from other records:  Neuroimaging Results: Along with previous imaging showing indications of her right frontal bleed patient also had a follow-up MRA conducted on 11/25/2020.  There was a question about a 2 mm saccular outpouching extending medially and inferiorly from the para thalamic left ICA that may reflect a small aneurysm but the finding was indeterminate.   CT scan conducted on 06/29/2020 did show subarachnoid hemorrhage within the basal cisterns and ventricular system  Current Typical Mood State:  Anxious  Sleep:  The patient describes sleep as having improved more recently.  The patient reports that she is now sleeping through the night since there was an increase in Lexapro.    Diet Pattern:  Patient describes her appetite is good most days.  Behavioral Observation/Mental Status:   YOANNA TIMPONE  presents as a 52 y.o.-year-old Right handed African American Female who appeared her stated age. her dress was Appropriate and she was Well Groomed and her manners were Appropriate to the situation.  her participation was indicative of Appropriate and Redirectable behaviors.  There were physical disabilities noted.  she displayed an  appropriate level of cooperation and motivation.    Interactions:    Active Appropriate  Attention:   abnormal and attention span appeared shorter than expected for age  Memory:   abnormal; remote memory intact, recent memory impaired  Visuo-spatial:   abnormal  Speech (Volume):  normal  Speech:   non-fluent aphasia; garbled  Thought Process:  Coherent and Relevant  Coherent, Directed, and Oriented  Though Content:  WNL; not suicidal and not homicidal  Orientation:   person, place, time/date, and situation  Judgment:   Fair  Planning:   Fair  Affect:    Anxious  Mood:    Anxious  Insight:   Good  Intelligence:   normal  Marital Status/Living:  The patient was born and raised in [place] and lives with her husband and children.  The patient has a 59 year old and a 52 year old.  Educational and Occupational History:     Highest Level of Education:   Patient graduated from college with her masters of science degree maintaining a 4.0 GPA attending [employer].  The patient was always an excellent student and participated in the plan as well as track in high school.  Current  Occupation:    The patient is not currently working and is disabled because of her neurological symptoms.  Hobbies and Interests: Patient had included biking, painting and reading.  Impact of Symptoms on Work or School:  Patient has not been able to work because of her neurological symptoms.  Impact of Symptoms on Social Functioning and Interpersonal Relationships: Patient has been more isolated and for a long time stayed in the upstairs of their house but has improved her interactions with others but still avoids going out of the house for fear of how others will perceive her.   Psychiatric History:     Previous Diagnoses: No prior psychiatric history  Past Psychiatric Treatments: No reported psychological or psychiatric signs or symptoms such as difficulty sleeping, anxiety, depression, delusions or hallucinations (schizophrenial), mood swings (bipolar disorders) or suicidal ideations or attempts  History of Substance Use or Abuse:  No concerns of substance abuse are reported.    Mental Health Hospitalizations:  No   Family Med/Psych History:  Family History  Problem Relation Age of Onset   Hypertension Mother        hx aneursym and surgery   Atrial fibrillation Mother    Sarcoidosis Mother    Anuerysm Mother        brain   Arthritis Mother    Healthy Father    Asthma Sister    Leukemia Maternal Grandmother    Heart disease Maternal Grandmother    Stroke Paternal Grandmother    Hypertension Daughter    Colon polyps Daughter 75       hermatoma oversized polyps-benign   Hypertension Daughter    Hyperlipidemia Other    Colon cancer Neg Hx     Impression/DX:   NADA MEKEEL is a 52 year old female referred for neuropsychological/psychological consult due to ongoing issues with cognitive functioning, expressive language functioning and anxiety and stress associated with post cerebrovascular event symptoms.  Patient has a past medical history that is included hypertension,  irritable bowel syndrome, anemia, undetermined connective tissue disease disorder and family history of cerebral aneurysms.  Patient had been followed previous to her cerebrovascular accident by neurology because of changes and abnormalities in gait, weakness and headache.  Patient had an MRI conducted in 2021 prior to her cerebrovascular accident with an interpretive  impression of a normal exam with no changes noted prior to previous MRI in 2018.  Patient was admitted on 06/29/2020 after being found down by her daughter with slurred speech and confusion.  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  Hemorrhage with surrounding edema noted in right frontal lobe area.  Patient was treated on the inpatient rehabilitation unit where I saw her on 07/17/2020.  The patient has continued to have difficulties with memory with the primary memory difficulties appear to be related to retrieval of information rather than an inability to store or organize new information.  The patient has significant variations in expressive language with significant times of profound nonfluent aphasia and other times where she is able to speak much more clearly but continues to have articulation changes from prior to her cerebrovascular event.  The patient also has a significant amount of anxiety and stress associated with her difficulties.  The patient continues to have motor deficits but these were present for the most part prior to this cerebrovascular accident related to her previous or pre-CVA neurological symptoms.  Disposition/Plan:  While we may need to conduct some formal neuropsychological assessment down the road the most pertinent need for the patient right now is working on coping and adjustment skills as her ongoing cognitive and expressive language deficits are having a significant impact on quality of life issues.  We have set the patient up for psychotherapeutic  interventions to try to address the functional exacerbation of her underlying expressive language changes post cerebrovascular event.  Once this is brought into better control and functioning we will look at any need for neuropsych assessment after that.  Diagnosis:    Generalized anxiety disorder  Frontal lobe and executive function deficit  Gait abnormality  Subarachnoid hemorrhage ([facility])         Electronically Signed   _______________________ Ilean Skill, Psy.D. Clinical Neuropsychologist

## 2022-06-08 DIAGNOSIS — Z01419 Encounter for gynecological examination (general) (routine) without abnormal findings: Secondary | ICD-10-CM | POA: Diagnosis not present

## 2022-06-08 DIAGNOSIS — N9419 Other specified dyspareunia: Secondary | ICD-10-CM | POA: Diagnosis not present

## 2022-06-20 DIAGNOSIS — R531 Weakness: Secondary | ICD-10-CM | POA: Diagnosis not present

## 2022-06-20 DIAGNOSIS — M359 Systemic involvement of connective tissue, unspecified: Secondary | ICD-10-CM | POA: Diagnosis not present

## 2022-06-20 DIAGNOSIS — R768 Other specified abnormal immunological findings in serum: Secondary | ICD-10-CM | POA: Diagnosis not present

## 2022-06-20 DIAGNOSIS — E663 Overweight: Secondary | ICD-10-CM | POA: Diagnosis not present

## 2022-06-20 DIAGNOSIS — I609 Nontraumatic subarachnoid hemorrhage, unspecified: Secondary | ICD-10-CM | POA: Diagnosis not present

## 2022-06-20 DIAGNOSIS — Z6828 Body mass index (BMI) 28.0-28.9, adult: Secondary | ICD-10-CM | POA: Diagnosis not present

## 2022-06-20 DIAGNOSIS — M797 Fibromyalgia: Secondary | ICD-10-CM | POA: Diagnosis not present

## 2022-06-22 DIAGNOSIS — R4189 Other symptoms and signs involving cognitive functions and awareness: Secondary | ICD-10-CM | POA: Diagnosis not present

## 2022-06-22 DIAGNOSIS — E669 Obesity, unspecified: Secondary | ICD-10-CM | POA: Diagnosis not present

## 2022-06-22 DIAGNOSIS — F321 Major depressive disorder, single episode, moderate: Secondary | ICD-10-CM | POA: Diagnosis not present

## 2022-06-22 DIAGNOSIS — M359 Systemic involvement of connective tissue, unspecified: Secondary | ICD-10-CM | POA: Diagnosis not present

## 2022-06-22 DIAGNOSIS — K219 Gastro-esophageal reflux disease without esophagitis: Secondary | ICD-10-CM | POA: Diagnosis not present

## 2022-06-22 DIAGNOSIS — I10 Essential (primary) hypertension: Secondary | ICD-10-CM | POA: Diagnosis not present

## 2022-06-22 DIAGNOSIS — R519 Headache, unspecified: Secondary | ICD-10-CM | POA: Diagnosis not present

## 2022-06-22 DIAGNOSIS — R5382 Chronic fatigue, unspecified: Secondary | ICD-10-CM | POA: Diagnosis not present

## 2022-06-22 DIAGNOSIS — F444 Conversion disorder with motor symptom or deficit: Secondary | ICD-10-CM | POA: Diagnosis not present

## 2022-06-22 DIAGNOSIS — D509 Iron deficiency anemia, unspecified: Secondary | ICD-10-CM | POA: Diagnosis not present

## 2022-06-22 DIAGNOSIS — I609 Nontraumatic subarachnoid hemorrhage, unspecified: Secondary | ICD-10-CM | POA: Diagnosis not present

## 2022-06-22 DIAGNOSIS — E559 Vitamin D deficiency, unspecified: Secondary | ICD-10-CM | POA: Diagnosis not present

## 2022-06-25 ENCOUNTER — Other Ambulatory Visit: Payer: Self-pay | Admitting: Physical Medicine & Rehabilitation

## 2022-07-13 ENCOUNTER — Other Ambulatory Visit: Payer: Self-pay | Admitting: *Deleted

## 2022-07-13 DIAGNOSIS — R5383 Other fatigue: Secondary | ICD-10-CM

## 2022-07-13 DIAGNOSIS — I609 Nontraumatic subarachnoid hemorrhage, unspecified: Secondary | ICD-10-CM

## 2022-07-13 MED ORDER — METHYLPHENIDATE HCL ER (OSM) 36 MG PO TBCR: 36.0000 mg | EXTENDED_RELEASE_TABLET | Freq: Every day | ORAL | 0 refills | Status: DC

## 2022-07-13 NOTE — Addendum Note (Signed)
Addended by: Faith Rogue T on: 07/13/2022 04:24 PM   Modules accepted: Orders

## 2022-07-13 NOTE — Telephone Encounter (Signed)
Filled  Written  ID  Drug  QTY  Days  Prescriber  RX #  Dispenser  Refill  Daily Dose*  Pymt Type  PMP  06/11/2022 05/12/2022 3  Methylphenidate Er 36 Mg Tab 30.00 30 Za Swa [id number] Nor ([id number]) 0/0  Comm Ins [place] 05/12/2022 05/12/2022 1  Methylphenidate Er 36 Mg Tab 30.00 30 Za Swa [id number] Nor ([id number]) 0/0  Comm Ins [place]

## 2022-07-14 ENCOUNTER — Telehealth: Payer: Self-pay

## 2022-07-14 NOTE — Telephone Encounter (Signed)
  [facility] has requested an alternative for  Methylphenidate ER.  Methylphenidate 36 MG ER tablet is not covered by [employer].    Amy Barry stated he is okay with paying the $60 for the Rx.   Call back phone [phone].

## 2022-08-05 ENCOUNTER — Other Ambulatory Visit: Payer: Self-pay | Admitting: Physical Medicine & Rehabilitation

## 2022-08-05 DIAGNOSIS — G441 Vascular headache, not elsewhere classified: Secondary | ICD-10-CM

## 2022-08-10 ENCOUNTER — Encounter: Payer: 59 | Attending: Physical Medicine & Rehabilitation | Admitting: Physical Medicine & Rehabilitation

## 2022-08-10 ENCOUNTER — Encounter: Payer: Self-pay | Admitting: Physical Medicine & Rehabilitation

## 2022-08-10 VITALS — BP 113/73 | HR 61 | Ht 61.0 in | Wt 156.0 lb

## 2022-08-10 DIAGNOSIS — I609 Nontraumatic subarachnoid hemorrhage, unspecified: Secondary | ICD-10-CM | POA: Diagnosis present

## 2022-08-10 DIAGNOSIS — R41844 Frontal lobe and executive function deficit: Secondary | ICD-10-CM | POA: Diagnosis present

## 2022-08-10 DIAGNOSIS — F411 Generalized anxiety disorder: Secondary | ICD-10-CM | POA: Insufficient documentation

## 2022-08-10 MED ORDER — METHYLPHENIDATE HCL ER (OSM) 54 MG PO TBCR: 54.0000 mg | EXTENDED_RELEASE_TABLET | ORAL | 0 refills | Status: DC

## 2022-08-10 MED ORDER — METHYLPHENIDATE HCL ER (OSM) 54 MG PO TBCR
54.0000 mg | EXTENDED_RELEASE_TABLET | ORAL | 0 refills | Status: DC
Start: 2022-08-10 — End: 2022-08-31

## 2022-08-10 NOTE — Patient Instructions (Addendum)
ALWAYS FEEL FREE TO CALL OUR OFFICE WITH ANY PROBLEMS OR QUESTIONS [phone])  **PLEASE NOTE** ALL MEDICATION REFILL REQUESTS (INCLUDING CONTROLLED SUBSTANCES) NEED TO BE MADE AT LEAST 7 DAYS PRIOR TO REFILL BEING DUE. ANY REFILL REQUESTS INSIDE THAT TIME FRAME MAY RESULT IN DELAYS IN RECEIVING YOUR PRESCRIPTION.    CONTINUE BUILDING ON YOUR SOCIAL INTERACTIONS AND EXERCISE!    START BAKING YOUR CAKES AGAIN!   IF THE INCREASE IN [place] CAUSES YOU TO FEEL ANXIOUS OR FIGIDITY OR IF IT CAUSES YOUR BLOOD PRESSURE OR HEART RATE TO INCREASE, PLEASE LET ME KNOW.

## 2022-08-10 NOTE — Progress Notes (Signed)
Subjective:    Patient ID: Amy Barry, female    DOB: 1970/12/01, 52 y.o.   MRN: [id number]  HPI  Rubbie is here in follow up of her ICH. She says she's doing more at home. She's now cooking more for the family. She used to like baking cakes.    She is now getting out to go to church on the weekends. She says the first time it was a little rough and overwhelming for her with the sounds and amount of people.   From an exercise standpoint she's moving more. She walks around her house a lot. She walks more without her RW and does some furniture walking. She denies any falls. Depth perception can be a problem. She does have progressive lenses.    She remains on concerta for arousal and initiation and trazodone for sleep. No seizures keppra. Headaches have been more variable and come in waves of a week at a time.     She remains on concerta for arousal/initiation which has helped her. We recently increased her lexapro to 10mg  daily. She also uses trazodone for sleep. She remains on keppra for sz prophylaxis and topamax for headache control.     Pain Inventory Average Pain 4 Pain Right Now 4 My pain is aching  LOCATION OF PAIN  head  BOWEL Number of stools per week: 3 Oral laxative use No  Type of laxative . Enema or suppository use No  History of colostomy No  Incontinent No   BLADDER Normal In and out cath, frequency . Able to self cath  . Bladder incontinence No  Frequent urination No  Leakage with coughing No  Difficulty starting stream No  Incomplete bladder emptying No    Mobility walk without assistance walk with assistance use a Gertsch ability to climb steps?  yes do you drive?  no  Function disabled: date disabled   I need assistance with the following:  meal prep, household duties, and shopping  Neuro/Psych weakness numbness tingling trouble walking dizziness confusion anxiety Prior Studies Any changes since last visit?  no  Physicians  involved in your care Any changes since last visit?  no   Family History  Problem Relation Age of Onset   Hypertension Mother        hx aneursym and surgery   Atrial fibrillation Mother    Sarcoidosis Mother    Anuerysm Mother        brain   Arthritis Mother    Healthy Father    Asthma Sister    Leukemia Maternal Grandmother    Heart disease Maternal Grandmother    Stroke Paternal Grandmother    Hypertension Daughter    Colon polyps Daughter 9       hermatoma oversized polyps-benign   Hypertension Daughter    Hyperlipidemia Other    Colon cancer Neg Hx    Social History   Socioeconomic History   Marital status: Married    Spouse name: Not on file   Number of children: 2   Years of education: College   Highest education level: Not on file  Occupational History   Occupation: Teacher, adult education: Advertising copywriter  Tobacco Use   Smoking status: Never   Smokeless tobacco: Never  Vaping Use   Vaping Use: Never used  Substance and Sexual Activity   Alcohol use: No    Alcohol/week: 0.0 standard drinks of alcohol   Drug use: Never   Sexual activity: Yes  Partners: Male    Birth control/protection: Surgical  Other Topics Concern   Not on file  Social History Narrative   Lives at home with husband.   Right-handed.   Occasional caffeine use.   Social Determinants of Health   Financial Resource Strain: Not on file  Food Insecurity: Not on file  Transportation Needs: Not on file  Physical Activity: Not on file  Stress: Not on file  Social Connections: Not on file   Past Surgical History:  Procedure Laterality Date   CERVICAL DISC ARTHROPLASTY N/A 10/23/2015   Procedure: Cervical Disc Arthroplasty Cervical six-seven;  Surgeon: Tia Alert, MD;  Location: [facility];  Service: Neurosurgery;  Laterality: N/A;   D & C HYSTEROSCOPY W/ RESECTION ENDOMETRIAL POLYP  10-13-2003    dr rivard @[facility]    ESOPHAGOGASTRODUODENOSCOPY     for GERD   IR ANGIO INTRA EXTRACRAN SEL  COM CAROTID INNOMINATE BILAT MOD SED  07/06/2020   IR ANGIO INTRA EXTRACRAN SEL INTERNAL CAROTID BILAT MOD SED  06/29/2020   IR ANGIO VERTEBRAL SEL VERTEBRAL BILAT MOD SED  06/29/2020   IR ANGIO VERTEBRAL SEL VERTEBRAL BILAT MOD SED  07/06/2020   LAPAROSCOPIC ABDOMINAL EXPLORATION  1996   dx ibs   OVARIAN CYST SURGERY  age 76   x2 cyst   RADIOLOGY WITH ANESTHESIA N/A 06/29/2020   Procedure: IR WITH ANESTHESIA;  Surgeon: Radiologist, Medication, MD;  Location: [facility];  Service: Radiology;  Laterality: N/A;   ROBOTIC ASSISTED TOTAL HYSTERECTOMY N/A 12/13/2017   Procedure: XI ROBOTIC ASSISTED TOTAL HYSTERECTOMYWITH BILATERAL SALPINGECTOMY;  Surgeon: Gerald Leitz, MD;  Location: [facility];  Service: Gynecology;  Laterality: N/A;   TUBAL LIGATION Bilateral 06-01-2002   dr Su Hilt @[facility]    PPTL   Past Medical History:  Diagnosis Date   Anxiety    Arthralgia    Generalized weakness    bilateral upper and lower extremities,  walks with cane   GERD (gastroesophageal reflux disease)    Headache(784.0)    Heart murmur    per pt "since childhood, no problems"   History of kidney stones    Hypertension    followed pcp   IBS (irritable bowel syndrome)    Iron deficiency anemia    12-06-2017 per pt recently had IV iron infusion July 2019   Low vitamin D level    Osteoarthritis    pt unsure about this   PVC (premature ventricular contraction)    per pt had holter monitor   SLE (systemic lupus erythematosus) (HCC)    rheumotologist-- dr syed   Stroke [facility]) 06/2020   Wears glasses    Ht 5\' 1"  (1.549 m)   Wt 156 lb (70.8 kg)   LMP 12/22/2017   BMI 29.48 kg/m   Opioid Risk Score:   Fall Risk Score:  `1  Depression screen [facility] 2/9     08/10/2022   10:25 AM 01/19/2022   11:40 AM 11/10/2021    4:02 PM 08/18/2021    2:57 PM 05/05/2021    3:12 PM 02/10/2021    2:44 PM 11/04/2020    1:23 PM  Depression screen [facility] 2/9  Decreased Interest 0 0 0 0 1 0 0  Down, Depressed, Hopeless 0 0 0 0 1 0 0  PHQ - 2 Score  0 0 0 0 2 0 0      Review of Systems  Gastrointestinal:  Positive for nausea.  Neurological:  Positive for dizziness, weakness and numbness.  All other systems reviewed  and are negative.      Objective:   Physical Exam  General: No acute distress HEENT: NCAT, EOMI, oral membranes moist Cards: reg rate  Chest: normal effort Abdomen: Soft, NT, ND Skin: dry, intact Extremities: no edema Psych: pleasant and appropriate , more relaxed Neuro: alert and oriented with cues.speech still baby talk but less so than last visit.  Improved insight and awareness.    Normal language and speech. Cranial nerve exam unremarkable. UE nearly 4/5. LE: 3 to 4/5 distally.  Slight left weakness more than right. Sl wide based to steppage gait. No [place] Musculoskeletal: no pain with rom           Assessment & Plan:  1.  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             -continue with HEP and social reintegration                             2.  Left shoulder pain, bicipital tendonitis:              - ice TID.              -hep.             3.  Seizure prophylaxis: keppra 500mg  bid---  4.  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  Migraine/vascular headaches---have improved in general             -topamax 100mg  bid--continue             -Maxalt 5 mg 1 to 2 tablets daily as needed for migraine headaches                           -vision/anxiety component             -relaxation/rest when needed              6. Anxiety and depression, fatigue/sleep             -increase concerta to 54mg  daily             -lexapro- maintain at 20mg  qhs-              -regular sleep > Continue trazodone             -family/social supports are great             -continue reiterate need for social re-acclimation             -neuropsych input of. Dr. Marvetta Gibbons appreciated-->f/u scheduled  later this week            melatonin 10mg                  20+ minutes of face to face patient care time were spent during this visit. All questions were encouraged and answered.  Follow up with me in 4 mos .

## 2022-08-31 ENCOUNTER — Telehealth: Payer: Self-pay

## 2022-08-31 MED ORDER — METHYLPHENIDATE HCL ER (OSM) 36 MG PO TBCR: 36.0000 mg | EXTENDED_RELEASE_TABLET | Freq: Every day | ORAL | 0 refills | Status: DC

## 2022-08-31 NOTE — Telephone Encounter (Signed)
As we discussed at her last OV, if it's too much we can go back to 36mg . I sent a new rx to her pharmacy.   thx

## 2022-08-31 NOTE — Telephone Encounter (Signed)
Amy Barry is not tolerating Concerta 54 MG very well. Since starting it a few weeks ago, she has had a constant headache. The headache is not relieved with OTC medications. Also complains of a mental fog, confusion with fatigue. Patient stated she is having a  problem with functioning.    Please advise.  Call back [phone].

## 2022-09-01 NOTE — Telephone Encounter (Signed)
Mr. Prinz has been informed. He will let Mrs. Dudash know about the RX change.

## 2022-09-05 ENCOUNTER — Other Ambulatory Visit: Payer: Self-pay | Admitting: Physical Medicine & Rehabilitation

## 2022-09-30 ENCOUNTER — Telehealth: Payer: Self-pay

## 2022-09-30 MED ORDER — METHYLPHENIDATE HCL ER (OSM) 36 MG PO TBCR: 36.0000 mg | EXTENDED_RELEASE_TABLET | Freq: Every day | ORAL | 0 refills | Status: DC

## 2022-09-30 NOTE — Telephone Encounter (Signed)
PMP was Reviewed.  Dr Riley Kill note was reviewed.  Methylphenidate e-scribed today.  Call placed to Ms. Juba spoke with Mr Goel regarding the above, she verbalizes understanding.

## 2022-09-30 NOTE — Telephone Encounter (Signed)
Methylphenidate refill. Last filled #30 on 09/02/22. Next appt 12/14/22. Can you address in Dr. Riley Kill absence?

## 2022-10-01 ENCOUNTER — Other Ambulatory Visit: Payer: Self-pay | Admitting: Physical Medicine & Rehabilitation

## 2022-10-01 DIAGNOSIS — I609 Nontraumatic subarachnoid hemorrhage, unspecified: Secondary | ICD-10-CM

## 2022-11-04 ENCOUNTER — Other Ambulatory Visit: Payer: Self-pay

## 2022-11-04 ENCOUNTER — Telehealth: Payer: Self-pay

## 2022-11-04 MED ORDER — METHYLPHENIDATE HCL ER (OSM) 36 MG PO TBCR: 36.0000 mg | EXTENDED_RELEASE_TABLET | Freq: Every day | ORAL | 0 refills | Status: DC

## 2022-11-04 NOTE — Telephone Encounter (Signed)
Rx sent.  thx 

## 2022-11-04 NOTE — Telephone Encounter (Signed)
Please send Methylphenidate refill to [facility] on [address].   Thank you

## 2022-12-14 ENCOUNTER — Encounter: Payer: Self-pay | Admitting: Physical Medicine & Rehabilitation

## 2022-12-14 ENCOUNTER — Encounter: Payer: Medicare Other | Attending: Physical Medicine & Rehabilitation | Admitting: Physical Medicine & Rehabilitation

## 2022-12-14 VITALS — BP 122/78 | HR 60 | Ht 61.0 in | Wt 161.2 lb

## 2022-12-14 DIAGNOSIS — I609 Nontraumatic subarachnoid hemorrhage, unspecified: Secondary | ICD-10-CM | POA: Insufficient documentation

## 2022-12-14 MED ORDER — ACETAMINOPHEN-CODEINE 300-30 MG PO TABS: 1.0000 | ORAL_TABLET | Freq: Four times a day (QID) | ORAL | 0 refills | Status: AC | PRN

## 2022-12-14 MED ORDER — METHYLPHENIDATE HCL ER (OSM) 36 MG PO TBCR
36.0000 mg | EXTENDED_RELEASE_TABLET | Freq: Every day | ORAL | 0 refills | Status: DC
Start: 2022-12-14 — End: 2023-03-08

## 2022-12-14 MED ORDER — METHYLPHENIDATE HCL ER (OSM) 36 MG PO TBCR: 36.0000 mg | EXTENDED_RELEASE_TABLET | Freq: Every day | ORAL | 0 refills | Status: DC

## 2022-12-14 NOTE — Patient Instructions (Signed)
ALWAYS FEEL FREE TO CALL OUR OFFICE WITH ANY PROBLEMS OR QUESTIONS ([phone])  **PLEASE NOTE** ALL MEDICATION REFILL REQUESTS (INCLUDING CONTROLLED SUBSTANCES) NEED TO BE MADE AT LEAST 7 DAYS PRIOR TO REFILL BEING DUE. ANY REFILL REQUESTS INSIDE THAT TIME FRAME MAY RESULT IN DELAYS IN RECEIVING YOUR PRESCRIPTION.                    

## 2022-12-14 NOTE — Progress Notes (Signed)
Subjective:    Patient ID: Amy Barry, female    DOB: 1970/09/05, 52 y.o.   MRN: [id number]  HPI  Amy Barry is here in follow up of her SAH. I last saw her in May. She is cooking at home and going out more. She likes to go to the store. She wears noise cancelling headphones at church which help filter out noise. She has made a conscious effort to do more. She showed me a picture of a beautiful cake she baked a friend!  She does report some nerve pain in her left leg which lasted about a week. It started in her left groin and radiated to her foot. She has an MRI from 3/21 which demonstrated mild DDD at L3-4, L5-S1.   She is walking without a cane of Amy Barry. She has no device with he today. She just takes her time to make sure she doesn't fall. Amy Barry avoids standing up too long.   Mood has been better in general. We tried increasing concerta to 54 mg but it was too much for her.      Pain Inventory Average Pain 4 Pain Right Now 5 My pain is sharp, dull, and throbbing  LOCATION OF PAIN  head and leg ( has episodes of nerve pain in legs  BOWEL Number of stools per week: 4-5  BLADDER Normal  Mobility walk without assistance how many minutes can you walk? 20 min  Function disabled: date disabled .  Neuro/Psych tingling  Prior Studies Any changes since last visit?  no  Physicians involved in your care Any changes since last visit?  no   Family History  Problem Relation Age of Onset   Hypertension Mother        hx aneursym and surgery   Atrial fibrillation Mother    Sarcoidosis Mother    Anuerysm Mother        brain   Arthritis Mother    Healthy Father    Asthma Sister    Leukemia Maternal Grandmother    Heart disease Maternal Grandmother    Stroke Paternal Grandmother    Hypertension Daughter    Colon polyps Daughter 9       hermatoma oversized polyps-benign   Hypertension Daughter    Hyperlipidemia Other    Colon cancer Neg Hx    Social History    Socioeconomic History   Marital status: Married    Spouse name: Not on file   Number of children: 2   Years of education: College   Highest education level: Not on file  Occupational History   Occupation: Teacher, adult education: Advertising copywriter  Tobacco Use   Smoking status: Never   Smokeless tobacco: Never  Vaping Use   Vaping status: Never Used  Substance and Sexual Activity   Alcohol use: No    Alcohol/week: 0.0 standard drinks of alcohol   Drug use: Never   Sexual activity: Yes    Partners: Male    Birth control/protection: Surgical  Other Topics Concern   Not on file  Social History Narrative   Lives at home with husband.   Right-handed.   Occasional caffeine use.   Social Determinants of Health   Financial Resource Strain: Not on file  Food Insecurity: Not on file  Transportation Needs: Not on file  Physical Activity: Not on file  Stress: Not on file  Social Connections: Not on file   Past Surgical History:  Procedure Laterality Date   CERVICAL DISC ARTHROPLASTY  N/A 10/23/2015   Procedure: Cervical Disc Arthroplasty Cervical six-seven;  Surgeon: Tia Alert, MD;  Location: [facility];  Service: Neurosurgery;  Laterality: N/A;   D & C HYSTEROSCOPY W/ RESECTION ENDOMETRIAL POLYP  10-13-2003    dr rivard @[facility]    ESOPHAGOGASTRODUODENOSCOPY     for GERD   IR ANGIO INTRA EXTRACRAN SEL COM CAROTID INNOMINATE BILAT MOD SED  07/06/2020   IR ANGIO INTRA EXTRACRAN SEL INTERNAL CAROTID BILAT MOD SED  06/29/2020   IR ANGIO VERTEBRAL SEL VERTEBRAL BILAT MOD SED  06/29/2020   IR ANGIO VERTEBRAL SEL VERTEBRAL BILAT MOD SED  07/06/2020   LAPAROSCOPIC ABDOMINAL EXPLORATION  1996   dx ibs   OVARIAN CYST SURGERY  age 43   x2 cyst   RADIOLOGY WITH ANESTHESIA N/A 06/29/2020   Procedure: IR WITH ANESTHESIA;  Surgeon: Radiologist, Medication, MD;  Location: [facility];  Service: Radiology;  Laterality: N/A;   ROBOTIC ASSISTED TOTAL HYSTERECTOMY N/A 12/13/2017   Procedure: XI ROBOTIC ASSISTED  TOTAL HYSTERECTOMYWITH BILATERAL SALPINGECTOMY;  Surgeon: Gerald Leitz, MD;  Location: [facility];  Service: Gynecology;  Laterality: N/A;   TUBAL LIGATION Bilateral 06-01-2002   dr Su Hilt @[facility]    PPTL   Past Medical History:  Diagnosis Date   Anxiety    Arthralgia    Generalized weakness    bilateral upper and lower extremities,  walks with cane   GERD (gastroesophageal reflux disease)    Headache(784.0)    Heart murmur    per pt "since childhood, no problems"   History of kidney stones    Hypertension    followed pcp   IBS (irritable bowel syndrome)    Iron deficiency anemia    12-06-2017 per pt recently had IV iron infusion July 2019   Low vitamin D level    Osteoarthritis    pt unsure about this   PVC (premature ventricular contraction)    per pt had holter monitor   SLE (systemic lupus erythematosus) (HCC)    rheumotologist-- dr syed   Stroke [facility]) 06/2020   Wears glasses    BP 122/78   Pulse 60   Ht 5\' 1"  (1.549 m)   Wt 161 lb 3.2 oz (73.1 kg)   LMP 12/22/2017   SpO2 97%   BMI 30.46 kg/m   Opioid Risk Score:   Fall Risk Score:  `1  Depression screen [place] 2/9     12/14/2022   10:19 AM 08/10/2022   10:25 AM 01/19/2022   11:40 AM 11/10/2021    4:02 PM 08/18/2021    2:57 PM 05/05/2021    3:12 PM 02/10/2021    2:44 PM  Depression screen [facility] 2/9  Decreased Interest 0 0 0 0 0 1 0  Down, Depressed, Hopeless 0 0 0 0 0 1 0  PHQ - 2 Score 0 0 0 0 0 2 0    Review of Systems  Constitutional: Negative.   HENT: Negative.    Eyes: Negative.   Respiratory: Negative.    Cardiovascular: Negative.   Gastrointestinal: Negative.   Endocrine: Negative.   Genitourinary: Negative.   Musculoskeletal: Negative.   Skin: Negative.   Allergic/Immunologic: Negative.   Neurological:  Positive for headaches.       Nerve pain in legs  Hematological: Negative.   Psychiatric/Behavioral: Negative.    All other systems reviewed and are negative.      Objective:   Physical  Exam  General: No acute distress HEENT: NCAT, EOMI, oral membranes moist Cards: reg  rate  Chest: normal effort Abdomen: Soft, NT, ND Skin: dry, intact Extremities: no edema Psych: pleasant and appropriate. In good spirits.  Neuro: alert and oriented with cues.speech still talking with an "accent" vs baby talk. Speech is generally clear however.   Improved insight and awareness.  memory functional.   Normal language and speech. Cranial nerve exam unremarkable. UE nearly 4/5. LE: 3 to 4/5 distally.  LE weakness with some cogwheeling. Tends to favor the right leg more than left. Leans forward a bit.  Musculoskeletal: mild elevation of right hemi-pelvis. Some discomfort with palpation of low back and right greater troch area. Did have a "catch" in her right low back/hip when she stood. Was able to adjust.            Assessment & Plan:  1.  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             -continue with HEP and social reintegration                             2.  Left shoulder pain, bicipital tendonitis:              - ice TID.              -hep -appears improved.             3.  Seizure prophylaxis: keppra 500mg  bid---  4.  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  Migraine/vascular headaches---better             -topamax 100mg  bid--continue             -Maxalt 5 mg 1 to 2 tablets daily as needed for migraine headaches              -anxiety component             -relaxation/rest when needed             -refilled T#3  6. Anxiety and depression, fatigue/sleep             -maintain concerta at 36mg  daily--RF today             -lexapro- maintain at 20mg  qhs-              -regular sleep > Continue trazodone             -family/social supports remain great             -continue reiterate need for social re-acclimation--she's making progress             -neuropsych  input of. Dr. Marvetta Gibbons appreciated-->f/u scheduled this fall          melatonin 10mg                  20 minutes of face to face patient care time were spent during this visit. All questions were encouraged and answered.  Follow up with me in 4 mos .

## 2022-12-29 ENCOUNTER — Telehealth: Payer: Self-pay

## 2022-12-29 NOTE — Telephone Encounter (Signed)
Letter from insurance temporary supply for Methylphenidate 36 mg ER. Why? As a new plan member in the first 90 days of membership, allowed a temporary supply when your drug is not on the plan's Drug List or is restricted in some way.  What can be done: change prescription and try another drug that may treat condition, drug exception  Suggested alternative from drug list: Dexmethylphenidate ER, Amphetamine/Dextroamphetamine, Lisdexamfetamine, Atomoxetine

## 2022-12-30 NOTE — Telephone Encounter (Signed)
We can switch her to Dexmethylphenidate ER.  The issue may be however: does her pharmacy carry this? We switched to concerta some time ago due to a ritalin supply issue at pharmacy.   I am certainly happy to switch her to the above, but there is no guarantee pharmacy has it.

## 2023-01-11 NOTE — Telephone Encounter (Signed)
Called pt, no answer, left VM.

## 2023-01-26 ENCOUNTER — Encounter: Payer: Medicare Other | Attending: Physical Medicine & Rehabilitation | Admitting: Psychology

## 2023-01-26 DIAGNOSIS — F329 Major depressive disorder, single episode, unspecified: Secondary | ICD-10-CM | POA: Diagnosis not present

## 2023-01-26 DIAGNOSIS — F411 Generalized anxiety disorder: Secondary | ICD-10-CM

## 2023-01-26 DIAGNOSIS — I609 Nontraumatic subarachnoid hemorrhage, unspecified: Secondary | ICD-10-CM | POA: Diagnosis not present

## 2023-01-26 DIAGNOSIS — R41844 Frontal lobe and executive function deficit: Secondary | ICD-10-CM

## 2023-02-07 DIAGNOSIS — K219 Gastro-esophageal reflux disease without esophagitis: Secondary | ICD-10-CM | POA: Diagnosis not present

## 2023-02-07 DIAGNOSIS — I609 Nontraumatic subarachnoid hemorrhage, unspecified: Secondary | ICD-10-CM | POA: Diagnosis not present

## 2023-02-07 DIAGNOSIS — R519 Headache, unspecified: Secondary | ICD-10-CM | POA: Diagnosis not present

## 2023-02-07 DIAGNOSIS — M2550 Pain in unspecified joint: Secondary | ICD-10-CM | POA: Diagnosis not present

## 2023-02-07 DIAGNOSIS — G441 Vascular headache, not elsewhere classified: Secondary | ICD-10-CM | POA: Diagnosis not present

## 2023-02-07 DIAGNOSIS — I10 Essential (primary) hypertension: Secondary | ICD-10-CM | POA: Diagnosis not present

## 2023-02-07 DIAGNOSIS — Z79899 Other long term (current) drug therapy: Secondary | ICD-10-CM | POA: Diagnosis not present

## 2023-02-07 DIAGNOSIS — D509 Iron deficiency anemia, unspecified: Secondary | ICD-10-CM | POA: Diagnosis not present

## 2023-02-07 DIAGNOSIS — R5382 Chronic fatigue, unspecified: Secondary | ICD-10-CM | POA: Diagnosis not present

## 2023-02-07 DIAGNOSIS — E559 Vitamin D deficiency, unspecified: Secondary | ICD-10-CM | POA: Diagnosis not present

## 2023-02-22 ENCOUNTER — Encounter: Payer: Medicare Other | Attending: Physical Medicine & Rehabilitation | Admitting: Psychology

## 2023-02-22 DIAGNOSIS — R269 Unspecified abnormalities of gait and mobility: Secondary | ICD-10-CM | POA: Diagnosis present

## 2023-02-22 DIAGNOSIS — F329 Major depressive disorder, single episode, unspecified: Secondary | ICD-10-CM | POA: Insufficient documentation

## 2023-02-22 DIAGNOSIS — R41844 Frontal lobe and executive function deficit: Secondary | ICD-10-CM | POA: Diagnosis not present

## 2023-02-22 DIAGNOSIS — I609 Nontraumatic subarachnoid hemorrhage, unspecified: Secondary | ICD-10-CM | POA: Insufficient documentation

## 2023-02-22 DIAGNOSIS — F411 Generalized anxiety disorder: Secondary | ICD-10-CM | POA: Diagnosis present

## 2023-02-23 ENCOUNTER — Encounter: Payer: Self-pay | Admitting: Psychology

## 2023-02-23 NOTE — Progress Notes (Signed)
Neuropsychology Visit  Patient:  Amy Barry   DOB: [birth year]  MR Number: [id number]  Location: [facility] A DEPT OF [facility] [address] [place], [place] [address] [place] [address] Dept: [phone]  Date of Service: 02/22/2023  Start: 10 AM End: 11 AM  Today's visit was conducted in my outpatient clinic office with the patient and her husband present.  The patient's husband provided some context to what was going on but generally stayed out of the conversation for the most part although he was able to provide some helpful information at various times.  Duration of Service: 1 Hour  Provider/Observer:     Hershal Coria PsyD  Chief Complaint:      Chief Complaint  Patient presents with   Memory Loss   Anxiety   Depression   Other    Expressive language deficits    Reason For Service:     Amy Barry is a 52 year old female referred for neuropsychological/psychological consult due to ongoing issues with cognitive functioning, expressive language functioning and anxiety and stress associated with post cerebrovascular event symptoms.  Patient has a past medical history that is included hypertension, irritable bowel syndrome, anemia, undetermined connective tissue disease disorder and family history of cerebral aneurysms.  Patient had been followed previous to her cerebrovascular accident by neurology because of changes and abnormalities in gait, weakness and headache.  Patient had an MRI conducted in 2021 prior to her cerebrovascular accident with an interpretive impression of a normal exam with no changes noted prior to previous MRI in 2018.  Patient was admitted on 06/29/2020 after being found down by her daughter with slurred speech and confusion.  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  Hemorrhage with surrounding  edema noted in right frontal lobe area.  Patient was treated on the inpatient rehabilitation unit where I saw her on 07/17/2020.  The patient has continued to have difficulties with memory with the primary memory difficulties appear to be related to retrieval of information rather than an inability to store or organize new information.  The patient has significant variations in expressive language with significant times of profound nonfluent aphasia and other times where she is able to speak much more clearly but continues to have articulation changes from prior to her cerebrovascular event.  The patient also has a significant amount of anxiety and stress associated with her difficulties.  The patient continues to have motor deficits but these were present for the most part prior to this cerebrovascular accident related to her previous or pre-CVA neurological symptoms.   During the clinical interview on 05/30/2022, the patient and her husband both acknowledged changes in speech, memory and vision.  The patient has had review by ophthalmology stating that there was not a lot they could do about her vision changes.  The patient herself reports that she is having difficulty remembering to do things and remembering what was talked about previously.  The patient reports that she is trying to write things down and make lists but then forgets about the list itself.  The patient has worked with speech therapy around strategies to manage with her memory difficulties and sets alarms on their phones.  The patient reports that it does not work every day and she forgets about events.  Patient reports that she forgets to pass messages  along to family members.  Patient describes significant speech with significant variability in capacity.  The patient reports that at baseline she does not talk like she did before and has an odd accident to her speech.  She reports that in her head that she talks like she has always talked but  knows that it comes out differently than she is intending.  The patient reports that she has difficulty finding the words and executing the pronunciation of words and getting grammar correct.  There are other times where her speech becomes almost completely nonfluent.  Patient reports that noise and stress can trigger the symptoms and that others will notice sometimes a tear forming or she becomes very fidgety.  The patient reports that she becomes almost paralyzed and cannot do anything or say anything.  She even reports that there are times when the side of her face will pull.  She reports that these events last between 5 minutes to 1 hour.  Patient described a significant event this past December where she had a significant change in her speech where it went to "gibberish."  Patient reports that in her mind it sounds like she is speaking okay but others hear gibberish words.  Patient reports that she does not feel particularly anxious during these times and sometimes they can happen just out of the blue.  Patient reports that when things are quiet and calm it can be almost "normal."  Patient reports that for the first part after her CVA in 2022 that she spent most of her time in her room upstairs in their house where a humidifier was making a white noise.  The patient reports that she did better there but when she would come downstairs with noise and things like TV going that he will completely throw her off.   During the clinical interview on 05/30/2022, the patient's husband reports that she has made significant improvements and that the primary difference for now versus before the stroke are these acute episodes with her speech and expressive language changes.  The patient's husband reports that there has been some loss of memory capacity but as described by the patient herself this appears to be more having to do with retrieval of information and an inability to store and organize new information.  The  patient's husband reports that she is now doing things that initially were hindering her progress.  She is beginning to come down and spend more time with others and doing better the more time she spends with others.  The patient has begun fixing her own meals, dressing herself, keeping up with her medicines etc.  Patient's husband reports that cueing does help her with her memory but that she is constantly worrying that she might forget something or worrying about her speech.  Worry and self observation appear to exacerbate her expressive language.     Medical History:                         Past Medical History:  Diagnosis Date   Anxiety     Arthralgia     Generalized weakness      bilateral upper and lower extremities,  walks with cane   GERD (gastroesophageal reflux disease)     Headache(784.0)     Heart murmur      per pt "since childhood, no problems"   History of kidney stones     Hypertension      followed pcp  IBS (irritable bowel syndrome)     Iron deficiency anemia      12-06-2017 per pt recently had IV iron infusion July 2019   Low vitamin D level     Osteoarthritis      pt unsure about this   PVC (premature ventricular contraction)      per pt had holter monitor   SLE (systemic lupus erythematosus) (HCC)      rheumotologist-- dr syed   Stroke [facility]) 06/2020   Wears glasses                                                                 Patient Active Problem List    Diagnosis Date Noted   Generalized anxiety disorder 05/05/2021   Biceps tendonitis on left 02/10/2021   Vascular headache 08/05/2020   Hyponatremia 07/28/2020   Headache 07/24/2020   Neck pain, musculoskeletal 07/24/2020   Cognitive deficits     Frontal lobe and executive function deficit     Subarachnoid bleed (HCC) 07/09/2020   Subarachnoid hemorrhage (HCC) 06/29/2020   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HCC) 03/04/2019   S/P hysterectomy 12/13/2017   Abnormal laboratory test 09/09/2016   Gait abnormality  08/03/2016   Chronic fatigue 08/03/2016   Paresthesia 08/03/2016   S/P cervical spinal fusion 10/23/2015   Hypersomnia 12/08/2010   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   EPIGASTRIC PAIN 10/13/2009   IRRITABLE BOWEL SYNDROME 03/10/2009   BACK PAIN, LUMBAR 07/01/2008   COSTOCHONDRITIS 02/01/2008   VIRAL URI 01/08/2008   CHEST PAIN 01/08/2008   OTHER SPECIFIED EPISODIC MOOD DISORDER 12/25/2007   ARM PAIN 12/25/2007   ANEMIA-NOS 10/02/2007   Essential hypertension 10/02/2007   GERD 10/02/2007   POLYP, GALLBLADDER 10/02/2007   UTERINE POLYP 10/02/2007   OSTEOARTHRITIS 10/02/2007   Dyskinesia 10/02/2007   HEADACHE 10/02/2007   PALPITATIONS 10/02/2007   CARDIAC MURMUR, HX OF 10/02/2007   NEPHROLITHIASIS, HX OF 10/02/2007      Onset and Duration of Symptoms: The patient was having neurological issues even before CVA because of changes and abnormalities in gait, weakness and headache.  Expressive language deficits and memory changes developed after her 2022 CVA.   Progression of Symptoms: Patient has made improvements but continues to have acute events with significant profound changes in expressive language.   Triggering Factors: Noise and stress.   Associated Symptoms (e.g., cognitive, emotional, behavioral): Memory retrieval deficits, anxiety and hypervigilance and avoidance behaviors   Additional Tests and Measures from other records:   Neuroimaging Results: Along with previous imaging showing indications of her right frontal bleed patient also had a follow-up MRA conducted on 11/25/2020.  There was a question about a 2 mm saccular outpouching extending medially and inferiorly from the para thalamic left ICA that may reflect a small aneurysm but the finding was indeterminate.  CT scan conducted on 06/29/2020 did show subarachnoid hemorrhage within the basal cisterns and ventricular system   Current Typical Mood State:  Anxious   Sleep:  The patient describes sleep as having improved more  recently.  The patient reports that she is now sleeping through the night since there was an increase in Lexapro.     Diet Pattern:  Patient describes her appetite is good most days.  Treatment Interventions:  Cognitive/behavioral interventions and building coping skills and strategies  around residual effects of her cerebrovascular accident including expressive language, motor deficits and abnormalities in gait, weakness and post cerebrovascular accident headaches.  Participation Level:   Active  Participation Quality:  Appropriate      Behavioral Observation:  Well Groomed, Alert, and Appropriate.   Current Psychosocial Factors: The patient reports that she feels like she has done a little bit better over the past 2 weeks particularly with improving sleep patterns and physical activity patterns.  The patient reports that she and her daughter went to the gym and she worked on the treadmill.  She was feeling rather motivated and suggested they walk home from the gym to her home but the patient's daughter and husband rejected that for worry that she would get part of the way home and not be able to continue.  However, she is feeling better about her overall status and has been actively working on some of the therapeutic interventions we had begun to address.  Content of Session:   Reviewed current symptoms and continue to work on therapeutic interventions around coping and adjustment post cerebrovascular accident with anxiety and depressive type symptoms.  Effectiveness of Interventions: The patient has been an active to spent in these therapeutic interventions and rapport has been easy to establish.  The patient remains quite motivated and engaged in therapeutic process.  Target Goals:   Today we began working on some of the primary and fundamental coping strategies particular around improving overall sleep function and developing strategies to compensate and work around residual cognitive and motor  deficits.  Goals Last Reviewed:   01/26/2023  Goals Addressed Today:    Today we continue to work on foundational aspects of coping and adjustment post cerebrovascular accident and the patient has already begun increasing physical activity working on a treadmill and we talked about ways of progressing to going for walks outside.  The patient is spending more time in purposeful efforts and direct communication with others rather than avoiding talking due to her changes in expressive language functions.  Impression/Diagnosis:   Amy Barry is a 52 year old female referred for neuropsychological/psychological consult due to ongoing issues with cognitive functioning, expressive language functioning and anxiety and stress associated with post cerebrovascular event symptoms.  Patient has a past medical history that is included hypertension, irritable bowel syndrome, anemia, undetermined connective tissue disease disorder and family history of cerebral aneurysms.  Patient had been followed previous to her cerebrovascular accident by neurology because of changes and abnormalities in gait, weakness and headache.  Patient had an MRI conducted in 2021 prior to her cerebrovascular accident with an interpretive impression of a normal exam with no changes noted prior to previous MRI in 2018.  Patient was admitted on 06/29/2020 after being found down by her daughter with slurred speech and confusion.  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  Hemorrhage with surrounding edema noted in right frontal lobe area.  Patient was treated on the inpatient rehabilitation unit where I saw her on 07/17/2020.  The patient has continued to have difficulties with memory with the primary memory difficulties appear to be related to retrieval of information rather than an inability to store or organize new information.  The patient has significant variations in expressive  language with significant times of profound nonfluent aphasia and other times where she is able to speak much more clearly but continues to have articulation changes from prior to her cerebrovascular event.  The patient also has a significant amount of anxiety and stress associated with her difficulties.  The patient continues to have motor deficits but these were present for the most part prior to this cerebrovascular accident related to her previous or pre-CVA neurological symptoms.  Diagnosis:   Generalized anxiety disorder  Reactive depression  Subarachnoid hemorrhage (HCC)  Frontal lobe and executive function deficit  Gait abnormality    Arley Phenix, Psy.D. Clinical Psychologist Neuropsychologist

## 2023-02-23 NOTE — Progress Notes (Signed)
Neuropsychology Visit  Patient:  Amy Barry   DOB: [birth year]  MR Number: [id number]  Location: [facility] OF [facility] [address] [place], [place] [address] [place] [address] Dept: [phone]  Date of Service: 01/22/2023  Start: 11 AM End: 12 PM  Today's visit was conducted in my outpatient clinic office with the patient and her husband present.  The patient's husband provided some context to what was going on but generally stayed out of the conversation for the most part although he was able to provide some helpful information at various times.  Duration of Service: 1 Hour  Provider/Observer:     Hershal Coria PsyD  Chief Complaint:      Chief Complaint  Patient presents with   Memory Loss   Anxiety   Other    Changes and deficits in expressive language including verbal fluency and articulation changes    Reason For Service:     Amy Barry is a 52 year old female referred for neuropsychological/psychological consult due to ongoing issues with cognitive functioning, expressive language functioning and anxiety and stress associated with post cerebrovascular event symptoms.  Patient has a past medical history that is included hypertension, irritable bowel syndrome, anemia, undetermined connective tissue disease disorder and family history of cerebral aneurysms.  Patient had been followed previous to her cerebrovascular accident by neurology because of changes and abnormalities in gait, weakness and headache.  Patient had an MRI conducted in 2021 prior to her cerebrovascular accident with an interpretive impression of a normal exam with no changes noted prior to previous MRI in 2018.  Patient was admitted on 06/29/2020 after being found down by her daughter with slurred speech and confusion.  Patient was found to have had a subarachnoid hemorrhage and posterior fossa felt to be  aneurysmal in nature.  Hemorrhage with surrounding edema noted in right frontal lobe area.  Patient was treated on the inpatient rehabilitation unit where I saw her on 07/17/2020.  The patient has continued to have difficulties with memory with the primary memory difficulties appear to be related to retrieval of information rather than an inability to store or organize new information.  The patient has significant variations in expressive language with significant times of profound nonfluent aphasia and other times where she is able to speak much more clearly but continues to have articulation changes from prior to her cerebrovascular event.  The patient also has a significant amount of anxiety and stress associated with her difficulties.  The patient continues to have motor deficits but these were present for the most part prior to this cerebrovascular accident related to her previous or pre-CVA neurological symptoms.   During the clinical interview on 05/30/2022, the patient and her husband both acknowledged changes in speech, memory and vision.  The patient has had review by ophthalmology stating that there was not a lot they could do about her vision changes.  The patient herself reports that she is having difficulty remembering to do things and remembering what was talked about previously.  The patient reports that she is trying to write things down and make lists but then forgets about the list itself.  The patient has worked with speech therapy around strategies to manage with her memory difficulties and sets alarms on their phones.  The patient reports that it does not work every day and she forgets about events.  Patient reports  that she forgets to pass messages along to family members.  Patient describes significant speech with significant variability in capacity.  The patient reports that at baseline she does not talk like she did before and has an odd accident to her speech.  She reports that in her head  that she talks like she has always talked but knows that it comes out differently than she is intending.  The patient reports that she has difficulty finding the words and executing the pronunciation of words and getting grammar correct.  There are other times where her speech becomes almost completely nonfluent.  Patient reports that noise and stress can trigger the symptoms and that others will notice sometimes a tear forming or she becomes very fidgety.  The patient reports that she becomes almost paralyzed and cannot do anything or say anything.  She even reports that there are times when the side of her face will pull.  She reports that these events last between 5 minutes to 1 hour.  Patient described a significant event this past December where she had a significant change in her speech where it went to "gibberish."  Patient reports that in her mind it sounds like she is speaking okay but others hear gibberish words.  Patient reports that she does not feel particularly anxious during these times and sometimes they can happen just out of the blue.  Patient reports that when things are quiet and calm it can be almost "normal."  Patient reports that for the first part after her CVA in 2022 that she spent most of her time in her room upstairs in their house where a humidifier was making a white noise.  The patient reports that she did better there but when she would come downstairs with noise and things like TV going that he will completely throw her off.   During the clinical interview on 05/30/2022, the patient's husband reports that she has made significant improvements and that the primary difference for now versus before the stroke are these acute episodes with her speech and expressive language changes.  The patient's husband reports that there has been some loss of memory capacity but as described by the patient herself this appears to be more having to do with retrieval of information and an inability to  store and organize new information.  The patient's husband reports that she is now doing things that initially were hindering her progress.  She is beginning to come down and spend more time with others and doing better the more time she spends with others.  The patient has begun fixing her own meals, dressing herself, keeping up with her medicines etc.  Patient's husband reports that cueing does help her with her memory but that she is constantly worrying that she might forget something or worrying about her speech.  Worry and self observation appear to exacerbate her expressive language.     Medical History:                         Past Medical History:  Diagnosis Date   Anxiety     Arthralgia     Generalized weakness      bilateral upper and lower extremities,  walks with cane   GERD (gastroesophageal reflux disease)     Headache(784.0)     Heart murmur      per pt "since childhood, no problems"   History of kidney stones     Hypertension  followed pcp   IBS (irritable bowel syndrome)     Iron deficiency anemia      12-06-2017 per pt recently had IV iron infusion July 2019   Low vitamin D level     Osteoarthritis      pt unsure about this   PVC (premature ventricular contraction)      per pt had holter monitor   SLE (systemic lupus erythematosus) (HCC)      rheumotologist-- dr syed   Stroke [facility]) 06/2020   Wears glasses                                                                 Patient Active Problem List    Diagnosis Date Noted   Generalized anxiety disorder 05/05/2021   Biceps tendonitis on left 02/10/2021   Vascular headache 08/05/2020   Hyponatremia 07/28/2020   Headache 07/24/2020   Neck pain, musculoskeletal 07/24/2020   Cognitive deficits     Frontal lobe and executive function deficit     Subarachnoid bleed (HCC) 07/09/2020   Subarachnoid hemorrhage (HCC) 06/29/2020   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HCC) 03/04/2019   S/P hysterectomy 12/13/2017   Abnormal laboratory  test 09/09/2016   Gait abnormality 08/03/2016   Chronic fatigue 08/03/2016   Paresthesia 08/03/2016   S/P cervical spinal fusion 10/23/2015   Hypersomnia 12/08/2010   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   EPIGASTRIC PAIN 10/13/2009   IRRITABLE BOWEL SYNDROME 03/10/2009   BACK PAIN, LUMBAR 07/01/2008   COSTOCHONDRITIS 02/01/2008   VIRAL URI 01/08/2008   CHEST PAIN 01/08/2008   OTHER SPECIFIED EPISODIC MOOD DISORDER 12/25/2007   ARM PAIN 12/25/2007   ANEMIA-NOS 10/02/2007   Essential hypertension 10/02/2007   GERD 10/02/2007   POLYP, GALLBLADDER 10/02/2007   UTERINE POLYP 10/02/2007   OSTEOARTHRITIS 10/02/2007   Dyskinesia 10/02/2007   HEADACHE 10/02/2007   PALPITATIONS 10/02/2007   CARDIAC MURMUR, HX OF 10/02/2007   NEPHROLITHIASIS, HX OF 10/02/2007      Onset and Duration of Symptoms: The patient was having neurological issues even before CVA because of changes and abnormalities in gait, weakness and headache.  Expressive language deficits and memory changes developed after her 2022 CVA.   Progression of Symptoms: Patient has made improvements but continues to have acute events with significant profound changes in expressive language.   Triggering Factors: Noise and stress.   Associated Symptoms (e.g., cognitive, emotional, behavioral): Memory retrieval deficits, anxiety and hypervigilance and avoidance behaviors   Additional Tests and Measures from other records:   Neuroimaging Results: Along with previous imaging showing indications of her right frontal bleed patient also had a follow-up MRA conducted on 11/25/2020.  There was a question about a 2 mm saccular outpouching extending medially and inferiorly from the para thalamic left ICA that may reflect a small aneurysm but the finding was indeterminate.  CT scan conducted on 06/29/2020 did show subarachnoid hemorrhage within the basal cisterns and ventricular system   Current Typical Mood State:  Anxious   Sleep:  The patient  describes sleep as having improved more recently.  The patient reports that she is now sleeping through the night since there was an increase in Lexapro.     Diet Pattern:  Patient describes her appetite is good most days.  Treatment Interventions:  Cognitive/behavioral interventions and building  coping skills and strategies around residual effects of her cerebrovascular accident including expressive language, motor deficits and abnormalities in gait, weakness and post cerebrovascular accident headaches.  Participation Level:   Active  Participation Quality:  Appropriate      Behavioral Observation:  Well Groomed, Alert, and Appropriate.   Current Psychosocial Factors: The patient continues to struggle particularly with her motor deficits and capacities to effectively express herself with significant word finding and fluency deficits continuing.  The patient's anxiety is exacerbated by her loss of function from cerebrovascular accident and gait and mobility issues continue to be problematic.  Significant sleep issues continue to be problematic as well.  Content of Session:   Reviewed current symptoms and continue to work on therapeutic interventions around coping and adjustment post cerebrovascular accident with anxiety and depressive type symptoms.  Effectiveness of Interventions: The patient has been an active to spent in these therapeutic interventions and rapport has been easy to establish.  The patient remains quite motivated and engaged in therapeutic process.  Target Goals:   Today we began working on some of the primary and fundamental coping strategies particular around improving overall sleep function and developing strategies to compensate and work around residual cognitive and motor deficits.  Goals Last Reviewed:   01/26/2023  Goals Addressed Today:    Today we worked on starting with foundational aspects that can have direct impact on mood state including sleep, physical activity  etc.  We also began working on cognitive adaptation skills regarding her cognitive deficits.  Impression/Diagnosis:   LASHARA MILLS is a 52 year old female referred for neuropsychological/psychological consult due to ongoing issues with cognitive functioning, expressive language functioning and anxiety and stress associated with post cerebrovascular event symptoms.  Patient has a past medical history that is included hypertension, irritable bowel syndrome, anemia, undetermined connective tissue disease disorder and family history of cerebral aneurysms.  Patient had been followed previous to her cerebrovascular accident by neurology because of changes and abnormalities in gait, weakness and headache.  Patient had an MRI conducted in 2021 prior to her cerebrovascular accident with an interpretive impression of a normal exam with no changes noted prior to previous MRI in 2018.  Patient was admitted on 06/29/2020 after being found down by her daughter with slurred speech and confusion.  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  Hemorrhage with surrounding edema noted in right frontal lobe area.  Patient was treated on the inpatient rehabilitation unit where I saw her on 07/17/2020.  The patient has continued to have difficulties with memory with the primary memory difficulties appear to be related to retrieval of information rather than an inability to store or organize new information.  The patient has significant variations in expressive language with significant times of profound nonfluent aphasia and other times where she is able to speak much more clearly but continues to have articulation changes from prior to her cerebrovascular event.  The patient also has a significant amount of anxiety and stress associated with her difficulties.  The patient continues to have motor deficits but these were present for the most part prior to this  cerebrovascular accident related to her previous or pre-CVA neurological symptoms.  Diagnosis:   Generalized anxiety disorder  Reactive depression  Subarachnoid hemorrhage (HCC)  Frontal lobe and executive function deficit    Arley Phenix, Psy.D. Clinical Psychologist Neuropsychologist

## 2023-03-08 ENCOUNTER — Telehealth: Payer: Self-pay | Admitting: Specialist

## 2023-03-08 ENCOUNTER — Encounter: Payer: Medicare Other | Attending: Physical Medicine & Rehabilitation | Admitting: Psychology

## 2023-03-08 DIAGNOSIS — I609 Nontraumatic subarachnoid hemorrhage, unspecified: Secondary | ICD-10-CM | POA: Insufficient documentation

## 2023-03-08 DIAGNOSIS — F411 Generalized anxiety disorder: Secondary | ICD-10-CM | POA: Insufficient documentation

## 2023-03-08 DIAGNOSIS — F329 Major depressive disorder, single episode, unspecified: Secondary | ICD-10-CM | POA: Insufficient documentation

## 2023-03-08 DIAGNOSIS — R41844 Frontal lobe and executive function deficit: Secondary | ICD-10-CM | POA: Insufficient documentation

## 2023-03-08 MED ORDER — METHYLPHENIDATE HCL ER (OSM) 36 MG PO TBCR
36.0000 mg | EXTENDED_RELEASE_TABLET | Freq: Every day | ORAL | 0 refills | Status: DC
Start: 2023-03-08 — End: 2023-04-12

## 2023-03-08 NOTE — Telephone Encounter (Signed)
Patient would like a refill of her methyphenidate called in to the [facility] on [address]. Shirlean Mylar, MHA, OT/L [phone]

## 2023-03-08 NOTE — Telephone Encounter (Signed)
Rx written and sent to the pharmacy. Thanks!

## 2023-03-19 ENCOUNTER — Other Ambulatory Visit: Payer: Self-pay | Admitting: Physical Medicine & Rehabilitation

## 2023-03-19 DIAGNOSIS — F329 Major depressive disorder, single episode, unspecified: Secondary | ICD-10-CM

## 2023-03-19 DIAGNOSIS — F411 Generalized anxiety disorder: Secondary | ICD-10-CM

## 2023-03-22 ENCOUNTER — Encounter: Payer: Medicare Other | Admitting: Psychology

## 2023-03-22 DIAGNOSIS — F411 Generalized anxiety disorder: Secondary | ICD-10-CM

## 2023-03-22 DIAGNOSIS — F329 Major depressive disorder, single episode, unspecified: Secondary | ICD-10-CM | POA: Diagnosis present

## 2023-03-22 DIAGNOSIS — I609 Nontraumatic subarachnoid hemorrhage, unspecified: Secondary | ICD-10-CM

## 2023-03-22 DIAGNOSIS — R41844 Frontal lobe and executive function deficit: Secondary | ICD-10-CM

## 2023-03-30 ENCOUNTER — Encounter: Payer: Self-pay | Admitting: Psychology

## 2023-03-30 NOTE — Progress Notes (Signed)
Neuropsychology Visit  Patient:  Amy Barry   DOB: 1970/07/12  MR Number: [id number]  Location: [facility] OF [facility] [address] [place], [place] [address] [place] [address] Dept: [phone]  Date of Service: 03/22/2023  Start: 11 AM End: 12 PM  Today's visit was conducted in my outpatient clinic office with the patient and her husband present.  The patient's husband provided some context to what was going on but generally stayed out of the conversation for the most part although he was able to provide some helpful information at various times.  Duration of Service: 1 Hour  Provider/Observer:     Hershal Coria PsyD  Chief Complaint:      Chief Complaint  Patient presents with   Memory Loss   Anxiety   Depression   Other     Expressive language deficits      Reason For Service:     Amy Barry is a 52 year old female referred for neuropsychological/psychological consult due to ongoing issues with cognitive functioning, expressive language functioning and anxiety and stress associated with post cerebrovascular event symptoms.  Patient has a past medical history that is included hypertension, irritable bowel syndrome, anemia, undetermined connective tissue disease disorder and family history of cerebral aneurysms.  Patient had been followed previous to her cerebrovascular accident by neurology because of changes and abnormalities in gait, weakness and headache.  Patient had an MRI conducted in 2021 prior to her cerebrovascular accident with an interpretive impression of a normal exam with no changes noted prior to previous MRI in 2018.  Patient was admitted on 06/29/2020 after being found down by her daughter with slurred speech and confusion.  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  Hemorrhage with  surrounding edema noted in right frontal lobe area.  Patient was treated on the inpatient rehabilitation unit where I saw her on 07/17/2020.  The patient has continued to have difficulties with memory with the primary memory difficulties appear to be related to retrieval of information rather than an inability to store or organize new information.  The patient has significant variations in expressive language with significant times of profound nonfluent aphasia and other times where she is able to speak much more clearly but continues to have articulation changes from prior to her cerebrovascular event.  The patient also has a significant amount of anxiety and stress associated with her difficulties.  The patient continues to have motor deficits but these were present for the most part prior to this cerebrovascular accident related to her previous or pre-CVA neurological symptoms.   During the clinical interview on 05/30/2022, the patient and her husband both acknowledged changes in speech, memory and vision.  The patient has had review by ophthalmology stating that there was not a lot they could do about her vision changes.  The patient herself reports that she is having difficulty remembering to do things and remembering what was talked about previously.  The patient reports that she is trying to write things down and make lists but then forgets about the list itself.  The patient has worked with speech therapy around strategies to manage with her memory difficulties and sets alarms on their phones.  The patient reports that it does not work every day and she forgets about events.  Patient reports that she forgets  to pass messages along to family members.  Patient describes significant speech with significant variability in capacity.  The patient reports that at baseline she does not talk like she did before and has an odd accident to her speech.  She reports that in her head that she talks like she has always  talked but knows that it comes out differently than she is intending.  The patient reports that she has difficulty finding the words and executing the pronunciation of words and getting grammar correct.  There are other times where her speech becomes almost completely nonfluent.  Patient reports that noise and stress can trigger the symptoms and that others will notice sometimes a tear forming or she becomes very fidgety.  The patient reports that she becomes almost paralyzed and cannot do anything or say anything.  She even reports that there are times when the side of her face will pull.  She reports that these events last between 5 minutes to 1 hour.  Patient described a significant event this past December where she had a significant change in her speech where it went to "gibberish."  Patient reports that in her mind it sounds like she is speaking okay but others hear gibberish words.  Patient reports that she does not feel particularly anxious during these times and sometimes they can happen just out of the blue.  Patient reports that when things are quiet and calm it can be almost "normal."  Patient reports that for the first part after her CVA in 2022 that she spent most of her time in her room upstairs in their house where a humidifier was making a white noise.  The patient reports that she did better there but when she would come downstairs with noise and things like TV going that he will completely throw her off.   During the clinical interview on 05/30/2022, the patient's husband reports that she has made significant improvements and that the primary difference for now versus before the stroke are these acute episodes with her speech and expressive language changes.  The patient's husband reports that there has been some loss of memory capacity but as described by the patient herself this appears to be more having to do with retrieval of information and an inability to store and organize new information.   The patient's husband reports that she is now doing things that initially were hindering her progress.  She is beginning to come down and spend more time with others and doing better the more time she spends with others.  The patient has begun fixing her own meals, dressing herself, keeping up with her medicines etc.  Patient's husband reports that cueing does help her with her memory but that she is constantly worrying that she might forget something or worrying about her speech.  Worry and self observation appear to exacerbate her expressive language.     Medical History:                         Past Medical History:  Diagnosis Date   Anxiety     Arthralgia     Generalized weakness      bilateral upper and lower extremities,  walks with cane   GERD (gastroesophageal reflux disease)     Headache(784.0)     Heart murmur      per pt "since childhood, no problems"   History of kidney stones     Hypertension  followed pcp   IBS (irritable bowel syndrome)     Iron deficiency anemia      12-06-2017 per pt recently had IV iron infusion July 2019   Low vitamin D level     Osteoarthritis      pt unsure about this   PVC (premature ventricular contraction)      per pt had holter monitor   SLE (systemic lupus erythematosus) (HCC)      rheumotologist-- dr syed   Stroke [facility]) 06/2020   Wears glasses                                                                 Patient Active Problem List    Diagnosis Date Noted   Generalized anxiety disorder 05/05/2021   Biceps tendonitis on left 02/10/2021   Vascular headache 08/05/2020   Hyponatremia 07/28/2020   Headache 07/24/2020   Neck pain, musculoskeletal 07/24/2020   Cognitive deficits     Frontal lobe and executive function deficit     Subarachnoid bleed (HCC) 07/09/2020   Subarachnoid hemorrhage (HCC) 06/29/2020   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HCC) 03/04/2019   S/P hysterectomy 12/13/2017   Abnormal laboratory test 09/09/2016   Gait abnormality  08/03/2016   Chronic fatigue 08/03/2016   Paresthesia 08/03/2016   S/P cervical spinal fusion 10/23/2015   Hypersomnia 12/08/2010   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   EPIGASTRIC PAIN 10/13/2009   IRRITABLE BOWEL SYNDROME 03/10/2009   BACK PAIN, LUMBAR 07/01/2008   COSTOCHONDRITIS 02/01/2008   VIRAL URI 01/08/2008   CHEST PAIN 01/08/2008   OTHER SPECIFIED EPISODIC MOOD DISORDER 12/25/2007   ARM PAIN 12/25/2007   ANEMIA-NOS 10/02/2007   Essential hypertension 10/02/2007   GERD 10/02/2007   POLYP, GALLBLADDER 10/02/2007   UTERINE POLYP 10/02/2007   OSTEOARTHRITIS 10/02/2007   Dyskinesia 10/02/2007   HEADACHE 10/02/2007   PALPITATIONS 10/02/2007   CARDIAC MURMUR, HX OF 10/02/2007   NEPHROLITHIASIS, HX OF 10/02/2007      Onset and Duration of Symptoms: The patient was having neurological issues even before CVA because of changes and abnormalities in gait, weakness and headache.  Expressive language deficits and memory changes developed after her 2022 CVA.   Progression of Symptoms: Patient has made improvements but continues to have acute events with significant profound changes in expressive language.   Triggering Factors: Noise and stress.   Associated Symptoms (e.g., cognitive, emotional, behavioral): Memory retrieval deficits, anxiety and hypervigilance and avoidance behaviors   Additional Tests and Measures from other records:   Neuroimaging Results: Along with previous imaging showing indications of her right frontal bleed patient also had a follow-up MRA conducted on 11/25/2020.  There was a question about a 2 mm saccular outpouching extending medially and inferiorly from the para thalamic left ICA that may reflect a small aneurysm but the finding was indeterminate.  CT scan conducted on 06/29/2020 did show subarachnoid hemorrhage within the basal cisterns and ventricular system   Current Typical Mood State:  Anxious   Sleep:  The patient describes sleep as having improved more  recently.  The patient reports that she is now sleeping through the night since there was an increase in Lexapro.     Diet Pattern:  Patient describes her appetite is good most days.  Treatment Interventions:  Cognitive/behavioral interventions and building  coping skills and strategies around residual effects of her cerebrovascular accident including expressive language, motor deficits and abnormalities in gait, weakness and post cerebrovascular accident headaches.  Participation Level:   Active  Participation Quality:  Appropriate      Behavioral Observation:  Well Groomed, Alert, and Appropriate.   Current Psychosocial Factors: The patient reports that she feels like she has done a little bit better over the past 2 weeks particularly with improving sleep patterns and physical activity patterns.  The patient reports that she and her daughter went to the gym and she worked on the treadmill.  She was feeling rather motivated and suggested they walk home from the gym to her home but the patient's daughter and husband rejected that for worry that she would get part of the way home and not be able to continue.  However, she is feeling better about her overall status and has been actively working on some of the therapeutic interventions we had begun to address.  Content of Session:   Reviewed current symptoms and continue to work on therapeutic interventions around coping and adjustment post cerebrovascular accident with anxiety and depressive type symptoms.  Effectiveness of Interventions: The patient has been an active to spent in these therapeutic interventions and rapport has been easy to establish.  The patient remains quite motivated and engaged in therapeutic process.  Target Goals:   Today we began working on some of the primary and fundamental coping strategies particular around improving overall sleep function and developing strategies to compensate and work around residual cognitive and motor  deficits.  Goals Last Reviewed:   01/26/2023  Goals Addressed Today:    Today we continue to work on foundational aspects of coping and adjustment post cerebrovascular accident and the patient has already begun increasing physical activity working on a treadmill and we talked about ways of progressing to going for walks outside.  The patient is spending more time in purposeful efforts and direct communication with others rather than avoiding talking due to her changes in expressive language functions.  Impression/Diagnosis:   Amy Barry is a 52 year old female referred for neuropsychological/psychological consult due to ongoing issues with cognitive functioning, expressive language functioning and anxiety and stress associated with post cerebrovascular event symptoms.  Patient has a past medical history that is included hypertension, irritable bowel syndrome, anemia, undetermined connective tissue disease disorder and family history of cerebral aneurysms.  Patient had been followed previous to her cerebrovascular accident by neurology because of changes and abnormalities in gait, weakness and headache.  Patient had an MRI conducted in 2021 prior to her cerebrovascular accident with an interpretive impression of a normal exam with no changes noted prior to previous MRI in 2018.  Patient was admitted on 06/29/2020 after being found down by her daughter with slurred speech and confusion.  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  Hemorrhage with surrounding edema noted in right frontal lobe area.  Patient was treated on the inpatient rehabilitation unit where I saw her on 07/17/2020.  The patient has continued to have difficulties with memory with the primary memory difficulties appear to be related to retrieval of information rather than an inability to store or organize new information.  The patient has significant variations in expressive  language with significant times of profound nonfluent aphasia and other times where she is able to speak much more clearly but continues to have articulation changes from prior to  her cerebrovascular event.  The patient also has a significant amount of anxiety and stress associated with her difficulties.  The patient continues to have motor deficits but these were present for the most part prior to this cerebrovascular accident related to her previous or pre-CVA neurological symptoms.  03/22/2023: During today's visit, the patient had more expressive language issues denied seeing previously.  The patient and husband were both present and reports that she has been having more episodes of acute worsening with regard to expressive language.  However, they both note that when things are calm at home she returns back to her baseline.  As the visit today progressed she began returning to her baseline and it does appear that stresses likely contributing or causing this acute change rather than any new cerebrovascular events.  We continue to work on coping and adjustment strategies.  Diagnosis:   Generalized anxiety disorder  Reactive depression  Frontal lobe and executive function deficit  Subarachnoid hemorrhage (HCC)    Arley Phenix, Psy.D. Clinical Psychologist Neuropsychologist

## 2023-04-12 ENCOUNTER — Other Ambulatory Visit: Payer: Self-pay

## 2023-04-12 DIAGNOSIS — I609 Nontraumatic subarachnoid hemorrhage, unspecified: Secondary | ICD-10-CM

## 2023-04-12 MED ORDER — METHYLPHENIDATE HCL ER (OSM) 36 MG PO TBCR: 36.0000 mg | EXTENDED_RELEASE_TABLET | Freq: Every day | ORAL | 0 refills | Status: DC

## 2023-04-12 NOTE — Telephone Encounter (Signed)
 Please send Methylphenidate  to [facility] on [address].  PMP:  Filled  Written  ID  Drug  QTY  Days  Prescriber  RX #  Dispenser  Refill  Daily Dose*  Pymt Type  PMP  03/08/2023 03/08/2023 1  Methylphenidate  Er 36 Mg Tab 30.00 30 Za Swa [id number] Nor ([id number]) 0/0  Private Pay  01/25/2023 12/14/2022 1  Methylphenidate  Er 36 Mg Tab 30.00 30 Za Swa [id number] Nor ([id number]) 0/0

## 2023-04-18 ENCOUNTER — Other Ambulatory Visit: Payer: Self-pay | Admitting: Physical Medicine & Rehabilitation

## 2023-04-18 DIAGNOSIS — F329 Major depressive disorder, single episode, unspecified: Secondary | ICD-10-CM

## 2023-04-18 DIAGNOSIS — F411 Generalized anxiety disorder: Secondary | ICD-10-CM

## 2023-04-19 ENCOUNTER — Encounter: Payer: Medicare Other | Admitting: Psychology

## 2023-04-19 ENCOUNTER — Encounter: Payer: Self-pay | Admitting: Physical Medicine & Rehabilitation

## 2023-04-19 ENCOUNTER — Encounter: Payer: Medicare Other | Attending: Physical Medicine & Rehabilitation | Admitting: Physical Medicine & Rehabilitation

## 2023-04-19 VITALS — BP 116/68 | HR 64 | Ht 61.0 in

## 2023-04-19 DIAGNOSIS — G441 Vascular headache, not elsewhere classified: Secondary | ICD-10-CM

## 2023-04-19 DIAGNOSIS — I609 Nontraumatic subarachnoid hemorrhage, unspecified: Secondary | ICD-10-CM

## 2023-04-19 DIAGNOSIS — F411 Generalized anxiety disorder: Secondary | ICD-10-CM

## 2023-04-19 DIAGNOSIS — R41844 Frontal lobe and executive function deficit: Secondary | ICD-10-CM

## 2023-04-19 DIAGNOSIS — G43709 Chronic migraine without aura, not intractable, without status migrainosus: Secondary | ICD-10-CM | POA: Diagnosis not present

## 2023-04-19 DIAGNOSIS — M7918 Myalgia, other site: Secondary | ICD-10-CM | POA: Diagnosis not present

## 2023-04-19 DIAGNOSIS — R4189 Other symptoms and signs involving cognitive functions and awareness: Secondary | ICD-10-CM | POA: Diagnosis present

## 2023-04-19 DIAGNOSIS — G43909 Migraine, unspecified, not intractable, without status migrainosus: Secondary | ICD-10-CM | POA: Insufficient documentation

## 2023-04-19 MED ORDER — METHOCARBAMOL 500 MG PO TABS: 500.0000 mg | ORAL_TABLET | Freq: Four times a day (QID) | ORAL | 3 refills | Status: DC | PRN

## 2023-04-19 MED ORDER — AIMOVIG 70 MG/ML ~~LOC~~ SOAJ: 70.0000 mg | SUBCUTANEOUS | 4 refills | Status: DC

## 2023-04-19 NOTE — Progress Notes (Signed)
 Subjective:    Patient ID: Amy Barry, female    DOB: May 29, 1970, 53 y.o.   MRN: [id number]  HPI  Amy Barry is here in follow up of her SAH and associated deficits. Her headaches come and go. She has a continuous mild headache but will have spikes in pain, migraine-like headaches 3-4 x per week. She has intermittent catatonic episodes which last a few minutes and then  resolve. She may have a worse headache afterwords and perhaps her speech might worsen. She uses topamax  and maxalt  as rx'd. She has not been able to exercise as much as she had been previously d/t headaches and fatigue.   She has been seeing dr. Cheryll Corti for mood and cognitive rx. She has found treatments helpful. She maintains on concerta  for concentration which she still finds helpful.    Pain Inventory Average Pain 4 Pain Right Now 4 My pain is aching  In the last 24 hours, has pain interfered with the following? General activity 5 Relation with others 5 Enjoyment of life 5 What TIME of day is your pain at its worst? varies Sleep (in general) Good  Pain is worse with: unsure Pain improves with: rest and medication Relief from Meds: 5  Family History  Problem Relation Age of Onset   Hypertension Mother        hx aneursym and surgery   Atrial fibrillation Mother    Sarcoidosis Mother    Anuerysm Mother        brain   Arthritis Mother    Healthy Father    Asthma Sister    Leukemia Maternal Grandmother    Heart disease Maternal Grandmother    Stroke Paternal Grandmother    Hypertension Daughter    Colon polyps Daughter 9       hermatoma oversized polyps-benign   Hypertension Daughter    Hyperlipidemia Other    Colon cancer Neg Hx    Social History   Socioeconomic History   Marital status: Married    Spouse name: Not on file   Number of children: 2   Years of education: College   Highest education level: Not on file  Occupational History   Occupation: Teacher, adult education: Advertising copywriter  Tobacco  Use   Smoking status: Never   Smokeless tobacco: Never  Vaping Use   Vaping status: Never Used  Substance and Sexual Activity   Alcohol use: No    Alcohol/week: 0.0 standard drinks of alcohol   Drug use: Never   Sexual activity: Yes    Partners: Male    Birth control/protection: Surgical  Other Topics Concern   Not on file  Social History Narrative   Lives at home with husband.   Right-handed.   Occasional caffeine use.   Social Drivers of Corporate investment banker Strain: Not on file  Food Insecurity: Not on file  Transportation Needs: Not on file  Physical Activity: Not on file  Stress: Not on file  Social Connections: Not on file   Past Surgical History:  Procedure Laterality Date   CERVICAL DISC ARTHROPLASTY N/A 10/23/2015   Procedure: Cervical Disc Arthroplasty Cervical six-seven;  Surgeon: Isadora Mar, MD;  Location: [facility];  Service: Neurosurgery;  Laterality: N/A;   D & C HYSTEROSCOPY W/ RESECTION ENDOMETRIAL POLYP  10-13-2003    dr rivard @[facility]    ESOPHAGOGASTRODUODENOSCOPY     for GERD   IR ANGIO INTRA EXTRACRAN SEL COM CAROTID INNOMINATE BILAT MOD SED  07/06/2020  IR ANGIO INTRA EXTRACRAN SEL INTERNAL CAROTID BILAT MOD SED  06/29/2020   IR ANGIO VERTEBRAL SEL VERTEBRAL BILAT MOD SED  06/29/2020   IR ANGIO VERTEBRAL SEL VERTEBRAL BILAT MOD SED  07/06/2020   LAPAROSCOPIC ABDOMINAL EXPLORATION  1996   dx ibs   OVARIAN CYST SURGERY  age 45   x2 cyst   RADIOLOGY WITH ANESTHESIA N/A 06/29/2020   Procedure: IR WITH ANESTHESIA;  Surgeon: Radiologist, Medication, MD;  Location: [facility];  Service: Radiology;  Laterality: N/A;   ROBOTIC ASSISTED TOTAL HYSTERECTOMY N/A 12/13/2017   Procedure: XI ROBOTIC ASSISTED TOTAL HYSTERECTOMYWITH BILATERAL SALPINGECTOMY;  Surgeon: Arlee Lace, MD;  Location: [facility];  Service: Gynecology;  Laterality: N/A;   TUBAL LIGATION Bilateral 06-01-2002   dr Adelene Homer @[facility]    PPTL   Past Surgical History:  Procedure Laterality Date   CERVICAL  DISC ARTHROPLASTY N/A 10/23/2015   Procedure: Cervical Disc Arthroplasty Cervical six-seven;  Surgeon: Isadora Mar, MD;  Location: [facility];  Service: Neurosurgery;  Laterality: N/A;   D & C HYSTEROSCOPY W/ RESECTION ENDOMETRIAL POLYP  10-13-2003    dr rivard @[facility]    ESOPHAGOGASTRODUODENOSCOPY     for GERD   IR ANGIO INTRA EXTRACRAN SEL COM CAROTID INNOMINATE BILAT MOD SED  07/06/2020   IR ANGIO INTRA EXTRACRAN SEL INTERNAL CAROTID BILAT MOD SED  06/29/2020   IR ANGIO VERTEBRAL SEL VERTEBRAL BILAT MOD SED  06/29/2020   IR ANGIO VERTEBRAL SEL VERTEBRAL BILAT MOD SED  07/06/2020   LAPAROSCOPIC ABDOMINAL EXPLORATION  1996   dx ibs   OVARIAN CYST SURGERY  age 45   x2 cyst   RADIOLOGY WITH ANESTHESIA N/A 06/29/2020   Procedure: IR WITH ANESTHESIA;  Surgeon: Radiologist, Medication, MD;  Location: [facility];  Service: Radiology;  Laterality: N/A;   ROBOTIC ASSISTED TOTAL HYSTERECTOMY N/A 12/13/2017   Procedure: XI ROBOTIC ASSISTED TOTAL HYSTERECTOMYWITH BILATERAL SALPINGECTOMY;  Surgeon: Arlee Lace, MD;  Location: [facility];  Service: Gynecology;  Laterality: N/A;   TUBAL LIGATION Bilateral 06-01-2002   dr Adelene Homer @[facility]    PPTL   Past Medical History:  Diagnosis Date   Anxiety    Arthralgia    Generalized weakness    bilateral upper and lower extremities,  walks with cane   GERD (gastroesophageal reflux disease)    Headache(784.0)    Heart murmur    per pt "since childhood, no problems"   History of kidney stones    Hypertension    followed pcp   IBS (irritable bowel syndrome)    Iron deficiency anemia    12-06-2017 per pt recently had IV iron infusion July 2019   Low vitamin D level    Osteoarthritis    pt unsure about this   PVC (premature ventricular contraction)    per pt had holter monitor   SLE (systemic lupus erythematosus) (HCC)    rheumotologist-- dr syed   Stroke [facility]) 06/2020   Wears glasses    BP 116/68   Pulse 64   Ht 5\' 1"  (1.549 m)   LMP 12/22/2017   SpO2 98%   BMI 30.46  kg/m   Opioid Risk Score:   Fall Risk Score:  `1  Depression screen [facility] 2/9     04/19/2023   10:07 AM 12/14/2022   10:19 AM 08/10/2022   10:25 AM 01/19/2022   11:40 AM 11/10/2021    4:02 PM 08/18/2021    2:57 PM 05/05/2021    3:12 PM  Depression screen [facility] 2/9  Decreased Interest  0 0 0 0 0 0 1  Down, Depressed, Hopeless 0 0 0 0 0 0 1  PHQ - 2 Score 0 0 0 0 0 0 2    Review of Systems  Neurological:  Positive for headaches.  All other systems reviewed and are negative.      Objective:   Physical Exam  General: No acute distress HEENT: NCAT, EOMI, oral membranes moist Cards: reg rate  Chest: normal effort Abdomen: Soft, NT, ND Skin: dry, intact Extremities: no edema Psych: pleasant and appropriate .  Neuro: still talking with accent. Alert and oriented x 3. Normal insight and awareness. Intact Memory. Normal language and speech. Cranial nerve exam unremarkable. MMT: 5/5 in all 4's. Fair but wide based gait. a.   t.            Assessment & Plan:  1.  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             -continue with HEP and social reintegration                            -has backed off some of exercise d/t h/a 2.  Left shoulder pain, bicipital tendonitis:              - ice TID.              -hep -appears improved.             3.  Seizure prophylaxis: keppra  500mg  bid---  4.  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  Migraine/vascular headaches---better             -topamax  100mg  bid--continue for now  -aimovig  injection 70mg  monthly--begin             -Maxalt  5 mg 1 to 2 tablets daily as needed for migraine headaches              -anxiety component             -relaxation/rest when needed               6. Anxiety and depression, fatigue/sleep             -maintain concerta  at 36mg  daily--no rf needed today             -lexapro - maintain at  20mg  qhs-              -regular sleep > Continue trazodone              -family/social supports is consistent             -continue reiterate need for social re-acclimation--has made some progress             -neuropsych input of. Dr. Minerva Alvine appreciated-->f/u scheduled this fall          melatonin 10mg                  20  minutes of face to face patient care time were spent during this visit. All questions were encouraged and answered.  Follow up with me in 4 mos .

## 2023-04-19 NOTE — Patient Instructions (Signed)
 ALWAYS FEEL FREE TO CALL OUR OFFICE WITH ANY PROBLEMS OR QUESTIONS [phone])  **PLEASE NOTE** ALL MEDICATION REFILL REQUESTS (INCLUDING CONTROLLED SUBSTANCES) NEED TO BE MADE AT LEAST 7 DAYS PRIOR TO REFILL BEING DUE. ANY REFILL REQUESTS INSIDE THAT TIME FRAME MAY RESULT IN DELAYS IN RECEIVING YOUR PRESCRIPTION.     !!!!!HAPPY NEW YEAR!!!!!

## 2023-05-17 ENCOUNTER — Other Ambulatory Visit: Payer: Self-pay

## 2023-05-17 DIAGNOSIS — I609 Nontraumatic subarachnoid hemorrhage, unspecified: Secondary | ICD-10-CM

## 2023-05-17 MED ORDER — METHYLPHENIDATE HCL ER (OSM) 36 MG PO TBCR: 36.0000 mg | EXTENDED_RELEASE_TABLET | Freq: Every day | ORAL | 0 refills | Status: DC

## 2023-05-18 ENCOUNTER — Telehealth: Payer: Self-pay

## 2023-05-18 NOTE — Telephone Encounter (Signed)
PA submitted for Methylphenidate  Darnelle Maffucci (Key: [id number])

## 2023-05-19 NOTE — Telephone Encounter (Signed)
Jake from [facility] called and LVM states additional information is needed for PA , call back number provided is [phone] states he will also fax request and needs response by 2/16

## 2023-05-22 ENCOUNTER — Other Ambulatory Visit: Payer: Self-pay | Admitting: Physical Medicine & Rehabilitation

## 2023-05-25 ENCOUNTER — Other Ambulatory Visit: Payer: Self-pay | Admitting: Physical Medicine & Rehabilitation

## 2023-06-04 DIAGNOSIS — R0981 Nasal congestion: Secondary | ICD-10-CM | POA: Diagnosis not present

## 2023-06-04 DIAGNOSIS — R051 Acute cough: Secondary | ICD-10-CM | POA: Diagnosis not present

## 2023-06-04 DIAGNOSIS — R509 Fever, unspecified: Secondary | ICD-10-CM | POA: Diagnosis not present

## 2023-06-04 DIAGNOSIS — J029 Acute pharyngitis, unspecified: Secondary | ICD-10-CM | POA: Diagnosis not present

## 2023-06-06 NOTE — Telephone Encounter (Signed)
 I have given th e denial to Dr Riley Kill to write a letter as to why she needs the medication.

## 2023-06-12 ENCOUNTER — Encounter: Payer: Self-pay | Admitting: Physical Medicine & Rehabilitation

## 2023-06-19 ENCOUNTER — Telehealth: Payer: Self-pay | Admitting: Physical Medicine & Rehabilitation

## 2023-06-19 DIAGNOSIS — I609 Nontraumatic subarachnoid hemorrhage, unspecified: Secondary | ICD-10-CM

## 2023-06-19 MED ORDER — METHYLPHENIDATE HCL ER (OSM) 36 MG PO TBCR: 36.0000 mg | EXTENDED_RELEASE_TABLET | Freq: Every day | ORAL | 0 refills | Status: DC

## 2023-06-19 NOTE — Telephone Encounter (Signed)
 Rx written and sent to the pharmacy. Thanks!

## 2023-06-19 NOTE — Telephone Encounter (Signed)
 Requesting refill for Methylphenidate, [facility] [address]   Please note patient will pay for prescription if not covered by insurance request [facility] proceeds with [facility].

## 2023-06-21 ENCOUNTER — Ambulatory Visit: Payer: Medicare Other | Admitting: Psychology

## 2023-06-21 ENCOUNTER — Other Ambulatory Visit: Payer: Self-pay | Admitting: Physical Medicine & Rehabilitation

## 2023-06-24 ENCOUNTER — Other Ambulatory Visit: Payer: Self-pay | Admitting: Physical Medicine & Rehabilitation

## 2023-06-24 DIAGNOSIS — I609 Nontraumatic subarachnoid hemorrhage, unspecified: Secondary | ICD-10-CM

## 2023-07-05 ENCOUNTER — Encounter: Payer: Medicare Other | Attending: Physical Medicine & Rehabilitation | Admitting: Psychology

## 2023-07-05 DIAGNOSIS — R41844 Frontal lobe and executive function deficit: Secondary | ICD-10-CM | POA: Insufficient documentation

## 2023-07-05 DIAGNOSIS — I609 Nontraumatic subarachnoid hemorrhage, unspecified: Secondary | ICD-10-CM | POA: Insufficient documentation

## 2023-07-05 DIAGNOSIS — F329 Major depressive disorder, single episode, unspecified: Secondary | ICD-10-CM | POA: Diagnosis present

## 2023-07-05 DIAGNOSIS — F411 Generalized anxiety disorder: Secondary | ICD-10-CM | POA: Diagnosis not present

## 2023-07-05 DIAGNOSIS — R269 Unspecified abnormalities of gait and mobility: Secondary | ICD-10-CM | POA: Diagnosis not present

## 2023-07-05 NOTE — Progress Notes (Signed)
 Neuropsychology Visit  Patient:  Amy Barry   DOB: Jan 24, 1971  MR Number: [id number]  Location: [facility] [place] [address] [place], [address] [place] [address] Dept: [phone]  Date of Service: 07/05/2023  Start: 8 AM End: 90 a.m.  Today's visit was conducted in my outpatient clinic office with the patient and her husband present.  The patient's husband provided some context to what was going on but generally stayed out of the conversation for the most part although he was able to provide some helpful information at various times.  Duration of Service: 1 Hour  Provider/Observer:     Hershal Coria PsyD  Chief Complaint:      Chief Complaint  Patient presents with   Memory Loss   Anxiety   Depression   Other    Expressive language deficits    Reason For Service:     BROOKELIN Barry is a 53 year old female referred for neuropsychological/psychological consult due to ongoing issues with cognitive functioning, expressive language functioning and anxiety and stress associated with post cerebrovascular event symptoms.  Patient has a past medical history that is included hypertension, irritable bowel syndrome, anemia, undetermined connective tissue disease disorder and family history of cerebral aneurysms.  Patient had been followed previous to her cerebrovascular accident by neurology because of changes and abnormalities in gait, weakness and headache.  Patient had an MRI conducted in 2021 prior to her cerebrovascular accident with an interpretive impression of a normal exam with no changes noted prior to previous MRI in 2018.  Patient was admitted on 06/29/2020 after being found down by her daughter with slurred speech and confusion.  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  Hemorrhage with surrounding edema noted in right  frontal lobe area.  Patient was treated on the inpatient rehabilitation unit where I saw her on 07/17/2020.  The patient has continued to have difficulties with memory with the primary memory difficulties appear to be related to retrieval of information rather than an inability to store or organize new information.  The patient has significant variations in expressive language with significant times of profound nonfluent aphasia and other times where she is able to speak much more clearly but continues to have articulation changes from prior to her cerebrovascular event.  The patient also has a significant amount of anxiety and stress associated with her difficulties.  The patient continues to have motor deficits but these were present for the most part prior to this cerebrovascular accident related to her previous or pre-CVA neurological symptoms.   During the clinical interview on 05/30/2022, the patient and her husband both acknowledged changes in speech, memory and vision.  The patient has had review by ophthalmology stating that there was not a lot they could do about her vision changes.  The patient herself reports that she is having difficulty remembering to do things and remembering what was talked about previously.  The patient reports that she is trying to write things down and make lists but then forgets about the list itself.  The patient has worked with speech therapy around strategies to manage with her memory difficulties and sets alarms on their phones.  The patient reports that it does not work every day and she forgets about events.  Patient reports that she forgets to pass messages along to family members.  Patient describes significant speech  with significant variability in capacity.  The patient reports that at baseline she does not talk like she did before and has an odd accident to her speech.  She reports that in her head that she talks like she has always talked but knows that it comes out  differently than she is intending.  The patient reports that she has difficulty finding the words and executing the pronunciation of words and getting grammar correct.  There are other times where her speech becomes almost completely nonfluent.  Patient reports that noise and stress can trigger the symptoms and that others will notice sometimes a tear forming or she becomes very fidgety.  The patient reports that she becomes almost paralyzed and cannot do anything or say anything.  She even reports that there are times when the side of her face will pull.  She reports that these events last between 5 minutes to 1 hour.  Patient described a significant event this past December where she had a significant change in her speech where it went to "gibberish."  Patient reports that in her mind it sounds like she is speaking okay but others hear gibberish words.  Patient reports that she does not feel particularly anxious during these times and sometimes they can happen just out of the blue.  Patient reports that when things are quiet and calm it can be almost "normal."  Patient reports that for the first part after her CVA in 2022 that she spent most of her time in her room upstairs in their house where a humidifier was making a white noise.  The patient reports that she did better there but when she would come downstairs with noise and things like TV going that he will completely throw her off.   During the clinical interview on 05/30/2022, the patient's husband reports that she has made significant improvements and that the primary difference for now versus before the stroke are these acute episodes with her speech and expressive language changes.  The patient's husband reports that there has been some loss of memory capacity but as described by the patient herself this appears to be more having to do with retrieval of information and an inability to store and organize new information.  The patient's husband reports  that she is now doing things that initially were hindering her progress.  She is beginning to come down and spend more time with others and doing better the more time she spends with others.  The patient has begun fixing her own meals, dressing herself, keeping up with her medicines etc.  Patient's husband reports that cueing does help her with her memory but that she is constantly worrying that she might forget something or worrying about her speech.  Worry and self observation appear to exacerbate her expressive language.     Medical History:                         Past Medical History:  Diagnosis Date   Anxiety     Arthralgia     Generalized weakness      bilateral upper and lower extremities,  walks with cane   GERD (gastroesophageal reflux disease)     Headache(784.0)     Heart murmur      per pt "since childhood, no problems"   History of kidney stones     Hypertension      followed pcp   IBS (irritable bowel syndrome)  Iron deficiency anemia      12-06-2017 per pt recently had IV iron infusion July 2019   Low vitamin D level     Osteoarthritis      pt unsure about this   PVC (premature ventricular contraction)      per pt had holter monitor   SLE (systemic lupus erythematosus) (HCC)      rheumotologist-- dr syed   Stroke [facility]) 06/2020   Wears glasses                                                                 Patient Active Problem List    Diagnosis Date Noted   Generalized anxiety disorder 05/05/2021   Biceps tendonitis on left 02/10/2021   Vascular headache 08/05/2020   Hyponatremia 07/28/2020   Headache 07/24/2020   Neck pain, musculoskeletal 07/24/2020   Cognitive deficits     Frontal lobe and executive function deficit     Subarachnoid bleed (HCC) 07/09/2020   Subarachnoid hemorrhage (HCC) 06/29/2020   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HCC) 03/04/2019   S/P hysterectomy 12/13/2017   Abnormal laboratory test 09/09/2016   Gait abnormality 08/03/2016   Chronic fatigue  08/03/2016   Paresthesia 08/03/2016   S/P cervical spinal fusion 10/23/2015   Hypersomnia 12/08/2010   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   EPIGASTRIC PAIN 10/13/2009   IRRITABLE BOWEL SYNDROME 03/10/2009   BACK PAIN, LUMBAR 07/01/2008   COSTOCHONDRITIS 02/01/2008   VIRAL URI 01/08/2008   CHEST PAIN 01/08/2008   OTHER SPECIFIED EPISODIC MOOD DISORDER 12/25/2007   ARM PAIN 12/25/2007   ANEMIA-NOS 10/02/2007   Essential hypertension 10/02/2007   GERD 10/02/2007   POLYP, GALLBLADDER 10/02/2007   UTERINE POLYP 10/02/2007   OSTEOARTHRITIS 10/02/2007   Dyskinesia 10/02/2007   HEADACHE 10/02/2007   PALPITATIONS 10/02/2007   CARDIAC MURMUR, HX OF 10/02/2007   NEPHROLITHIASIS, HX OF 10/02/2007      Onset and Duration of Symptoms: The patient was having neurological issues even before CVA because of changes and abnormalities in gait, weakness and headache.  Expressive language deficits and memory changes developed after her 2022 CVA.   Progression of Symptoms: Patient has made improvements but continues to have acute events with significant profound changes in expressive language.   Triggering Factors: Noise and stress.   Associated Symptoms (e.g., cognitive, emotional, behavioral): Memory retrieval deficits, anxiety and hypervigilance and avoidance behaviors   Additional Tests and Measures from other records:   Neuroimaging Results: Along with previous imaging showing indications of her right frontal bleed patient also had a follow-up MRA conducted on 11/25/2020.  There was a question about a 2 mm saccular outpouching extending medially and inferiorly from the para thalamic left ICA that may reflect a small aneurysm but the finding was indeterminate.  CT scan conducted on 06/29/2020 did show subarachnoid hemorrhage within the basal cisterns and ventricular system   Current Typical Mood State:  Anxious   Sleep:  The patient describes sleep as having improved more recently.  The patient  reports that she is now sleeping through the night since there was an increase in Lexapro.     Diet Pattern:  Patient describes her appetite is good most days.  Treatment Interventions:  Cognitive/behavioral interventions and building coping skills and strategies around residual effects of her cerebrovascular accident including  expressive language, motor deficits and abnormalities in gait, weakness and post cerebrovascular accident headaches.  Participation Level:   Active  Participation Quality:  Appropriate      Behavioral Observation:  Well Groomed, Alert, and Appropriate.   Current Psychosocial Factors: The patient reports continued stress when being outside of the home.  She reports continuation of good days and bad days particularly around her expressive language deficits.  Both the patient and her husband report that she has times where her expressive language capacities are much more fluent at home but still impaired.  When the patient is around the public she becomes increasingly apprehensive and worried about what other people think of her.  She expressed her described a number of situations where people thought that she was from somewhere other than local and that she was not a native [place] speaker.  The patient reports that this embarrassed her exacerbated her difficulties.  This became her primary focus today of the therapeutic interventions.  Content of Session:   Reviewed current symptoms and continue to work on therapeutic interventions around coping and adjustment post cerebrovascular accident with anxiety and depressive type symptoms.  Effectiveness of Interventions: The patient has been an active to spent in these therapeutic interventions and rapport has been easy to establish.  The patient remains quite motivated and engaged in therapeutic process.  Target Goals:   Today we began working on some of the primary and fundamental coping strategies particular around improving  overall sleep function and developing strategies to compensate and work around residual cognitive and motor deficits.  Goals Last Reviewed:   07/05/2023  Goals Addressed Today:    Today we continue to work on foundational aspects of coping and adjustment post cerebrovascular accident and the patient has already begun increasing physical activity working on a treadmill and we talked about ways of progressing to going for walks outside.  The patient is spending more time in purposeful efforts and direct communication with others rather than avoiding talking due to her changes in expressive language functions.  Outside of working on continued with her physical improvement as far as gait and strength we also worked on how to better manage the emotional impact of her expressive language deficits and concerns about what other people were "thinking" about her.  Impression/Diagnosis:   NIEVE ROJERO is a 53 year old female referred for neuropsychological/psychological consult due to ongoing issues with cognitive functioning, expressive language functioning and anxiety and stress associated with post cerebrovascular event symptoms.  Patient has a past medical history that is included hypertension, irritable bowel syndrome, anemia, undetermined connective tissue disease disorder and family history of cerebral aneurysms.  Patient had been followed previous to her cerebrovascular accident by neurology because of changes and abnormalities in gait, weakness and headache.  Patient had an MRI conducted in 2021 prior to her cerebrovascular accident with an interpretive impression of a normal exam with no changes noted prior to previous MRI in 2018.  Patient was admitted on 06/29/2020 after being found down by her daughter with slurred speech and confusion.  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  Hemorrhage with surrounding edema noted in right frontal lobe  area.  Patient was treated on the inpatient rehabilitation unit where I saw her on 07/17/2020.  The patient has continued to have difficulties with memory with the primary memory difficulties appear to be related to retrieval of information rather than an inability to store or  organize new information.  The patient has significant variations in expressive language with significant times of profound nonfluent aphasia and other times where she is able to speak much more clearly but continues to have articulation changes from prior to her cerebrovascular event.  The patient also has a significant amount of anxiety and stress associated with her difficulties.  The patient continues to have motor deficits but these were present for the most part prior to this cerebrovascular accident related to her previous or pre-CVA neurological symptoms.  03/22/2023: During today's visit, the patient had more expressive language issues denied seeing previously.  The patient and husband were both present and reports that she has been having more episodes of acute worsening with regard to expressive language.  However, they both note that when things are calm at home she returns back to her baseline.  As the visit today progressed she began returning to her baseline and it does appear that stresses likely contributing or causing this acute change rather than any new cerebrovascular events.  We continue to work on coping and adjustment strategies.  07/05/2023: Today we worked on coping and adjustment issues with significant expressive language deficits and psychomotor deficits.  The patient continues to describe significant variability in her expressive language capacity highly correlated with the situation she is having.  Diagnosis:   Frontal lobe and executive function deficit  Subarachnoid hemorrhage (HCC)  Generalized anxiety disorder  Reactive depression  Gait abnormality    Arley Phenix, Psy.D. Clinical  Psychologist Neuropsychologist

## 2023-07-21 ENCOUNTER — Other Ambulatory Visit: Payer: Self-pay | Admitting: Physical Medicine & Rehabilitation

## 2023-07-21 DIAGNOSIS — G441 Vascular headache, not elsewhere classified: Secondary | ICD-10-CM

## 2023-07-25 ENCOUNTER — Telehealth: Payer: Self-pay | Admitting: Physical Medicine & Rehabilitation

## 2023-07-25 DIAGNOSIS — I609 Nontraumatic subarachnoid hemorrhage, unspecified: Secondary | ICD-10-CM

## 2023-07-25 MED ORDER — METHYLPHENIDATE HCL ER (OSM) 36 MG PO TBCR: 36.0000 mg | EXTENDED_RELEASE_TABLET | Freq: Every day | ORAL | 0 refills | Status: DC

## 2023-07-25 NOTE — Telephone Encounter (Signed)
 Rx written and sent to the pharmacy. Thanks!

## 2023-07-25 NOTE — Telephone Encounter (Signed)
 Patient needs a refill on methylphenidate  sent to [facility].

## 2023-08-08 DIAGNOSIS — E559 Vitamin D deficiency, unspecified: Secondary | ICD-10-CM | POA: Diagnosis not present

## 2023-08-08 DIAGNOSIS — Z0000 Encounter for general adult medical examination without abnormal findings: Secondary | ICD-10-CM | POA: Diagnosis not present

## 2023-08-08 DIAGNOSIS — M329 Systemic lupus erythematosus, unspecified: Secondary | ICD-10-CM | POA: Diagnosis not present

## 2023-08-08 DIAGNOSIS — D509 Iron deficiency anemia, unspecified: Secondary | ICD-10-CM | POA: Diagnosis not present

## 2023-08-08 DIAGNOSIS — M359 Systemic involvement of connective tissue, unspecified: Secondary | ICD-10-CM | POA: Diagnosis not present

## 2023-08-08 DIAGNOSIS — I10 Essential (primary) hypertension: Secondary | ICD-10-CM | POA: Diagnosis not present

## 2023-08-08 DIAGNOSIS — E782 Mixed hyperlipidemia: Secondary | ICD-10-CM | POA: Diagnosis not present

## 2023-08-10 ENCOUNTER — Encounter: Payer: Medicare Other | Attending: Physical Medicine & Rehabilitation | Admitting: Psychology

## 2023-08-10 DIAGNOSIS — R41844 Frontal lobe and executive function deficit: Secondary | ICD-10-CM | POA: Insufficient documentation

## 2023-08-10 DIAGNOSIS — F329 Major depressive disorder, single episode, unspecified: Secondary | ICD-10-CM

## 2023-08-10 DIAGNOSIS — F411 Generalized anxiety disorder: Secondary | ICD-10-CM

## 2023-08-10 DIAGNOSIS — I609 Nontraumatic subarachnoid hemorrhage, unspecified: Secondary | ICD-10-CM | POA: Diagnosis not present

## 2023-08-10 DIAGNOSIS — R269 Unspecified abnormalities of gait and mobility: Secondary | ICD-10-CM

## 2023-08-10 DIAGNOSIS — G441 Vascular headache, not elsewhere classified: Secondary | ICD-10-CM | POA: Diagnosis not present

## 2023-08-23 ENCOUNTER — Encounter: Payer: Medicare Other | Admitting: Physical Medicine & Rehabilitation

## 2023-08-23 ENCOUNTER — Encounter: Payer: Self-pay | Admitting: Physical Medicine & Rehabilitation

## 2023-08-23 VITALS — BP 91/54 | Ht 61.0 in | Wt 163.2 lb

## 2023-08-23 DIAGNOSIS — G441 Vascular headache, not elsewhere classified: Secondary | ICD-10-CM

## 2023-08-23 DIAGNOSIS — R41844 Frontal lobe and executive function deficit: Secondary | ICD-10-CM

## 2023-08-23 DIAGNOSIS — I609 Nontraumatic subarachnoid hemorrhage, unspecified: Secondary | ICD-10-CM

## 2023-08-23 MED ORDER — RIZATRIPTAN BENZOATE 10 MG PO TABS
10.0000 mg | ORAL_TABLET | Freq: Every day | ORAL | 2 refills | Status: DC | PRN
Start: 2023-08-23 — End: 2023-12-20

## 2023-08-23 MED ORDER — METHYLPHENIDATE HCL ER (OSM) 36 MG PO TBCR: 36.0000 mg | EXTENDED_RELEASE_TABLET | Freq: Every day | ORAL | 0 refills | Status: DC

## 2023-08-23 NOTE — Patient Instructions (Signed)
 ALWAYS FEEL FREE TO CALL OUR OFFICE WITH ANY PROBLEMS OR QUESTIONS [phone])  **PLEASE NOTE** ALL MEDICATION REFILL REQUESTS (INCLUDING CONTROLLED SUBSTANCES) NEED TO BE MADE AT LEAST 7 DAYS PRIOR TO REFILL BEING DUE. ANY REFILL REQUESTS INSIDE THAT TIME FRAME MAY RESULT IN DELAYS IN RECEIVING YOUR PRESCRIPTION.

## 2023-08-23 NOTE — Progress Notes (Signed)
 Subjective:    Patient ID: Amy Barry, female    DOB: October 24, 1970, 53 y.o.   MRN: [id number]  HPI  Amy Barry is here in follow up of her SAH. She never received aimovig  d/t insurance denying. Her headaches are left-sided along her temple. She sometimes has a pulsating feeling as well. Dull headaches are continuous can happen daily for 1-2 weeks and then they may abate for a few days. Severe headaches happen 2-3 x per monh. She ran out of maxalt  a couple months back which helped to calm it down somewhat.   She has optometry visit on 6/22 to check vision. She feels that her rx is not appropriate anymore for her.   She remains on concerta  for her arousal and attention. This has helped. Her mood has been pretty up beat per family.   She walks 2-3 x per week for 20". Tamikia goes to church and on errands with her husband.   Pain Inventory Average Pain 6 Pain Right Now 5 My pain is constant, dull, and throbbing  In the last 24 hours, has pain interfered with the following? General activity 4 Relation with others 3 Enjoyment of life 2 What TIME of day is your pain at its worst? morning , daytime, evening, night, and varies Sleep (in general) Fair  Pain is worse with: walking Pain improves with: rest and medication Relief from Meds: 5  Family History  Problem Relation Age of Onset   Hypertension Mother        hx aneursym and surgery   Atrial fibrillation Mother    Sarcoidosis Mother    Anuerysm Mother        brain   Arthritis Mother    Healthy Father    Asthma Sister    Leukemia Maternal Grandmother    Heart disease Maternal Grandmother    Stroke Paternal Grandmother    Hypertension Daughter    Colon polyps Daughter 9       hermatoma oversized polyps-benign   Hypertension Daughter    Hyperlipidemia Other    Colon cancer Neg Hx    Social History   Socioeconomic History   Marital status: Married    Spouse name: Not on file   Number of children: 2   Years of education:  College   Highest education level: Not on file  Occupational History   Occupation: Teacher, adult education: Advertising copywriter  Tobacco Use   Smoking status: Never   Smokeless tobacco: Never  Vaping Use   Vaping status: Never Used  Substance and Sexual Activity   Alcohol use: No    Alcohol/week: 0.0 standard drinks of alcohol   Drug use: Never   Sexual activity: Yes    Partners: Male    Birth control/protection: Surgical  Other Topics Concern   Not on file  Social History Narrative   Lives at home with husband.   Right-handed.   Occasional caffeine use.   Social Drivers of Corporate investment banker Strain: Not on file  Food Insecurity: Not on file  Transportation Needs: Not on file  Physical Activity: Not on file  Stress: Not on file  Social Connections: Not on file   Past Surgical History:  Procedure Laterality Date   CERVICAL DISC ARTHROPLASTY N/A 10/23/2015   Procedure: Cervical Disc Arthroplasty Cervical six-seven;  Surgeon: Isadora Mar, MD;  Location: [facility];  Service: Neurosurgery;  Laterality: N/A;   D & C HYSTEROSCOPY W/ RESECTION ENDOMETRIAL POLYP  10-13-2003  dr rivard @[facility]    ESOPHAGOGASTRODUODENOSCOPY     for GERD   IR ANGIO INTRA EXTRACRAN SEL COM CAROTID INNOMINATE BILAT MOD SED  07/06/2020   IR ANGIO INTRA EXTRACRAN SEL INTERNAL CAROTID BILAT MOD SED  06/29/2020   IR ANGIO VERTEBRAL SEL VERTEBRAL BILAT MOD SED  06/29/2020   IR ANGIO VERTEBRAL SEL VERTEBRAL BILAT MOD SED  07/06/2020   LAPAROSCOPIC ABDOMINAL EXPLORATION  1996   dx ibs   OVARIAN CYST SURGERY  age 15   x2 cyst   RADIOLOGY WITH ANESTHESIA N/A 06/29/2020   Procedure: IR WITH ANESTHESIA;  Surgeon: Radiologist, Medication, MD;  Location: [facility];  Service: Radiology;  Laterality: N/A;   ROBOTIC ASSISTED TOTAL HYSTERECTOMY N/A 12/13/2017   Procedure: XI ROBOTIC ASSISTED TOTAL HYSTERECTOMYWITH BILATERAL SALPINGECTOMY;  Surgeon: Arlee Lace, MD;  Location: [facility];  Service: Gynecology;  Laterality: N/A;    TUBAL LIGATION Bilateral 06-01-2002   dr Adelene Homer @[facility]    PP[facility]   Past Surgical History:  Procedure Laterality Date   CERVICAL DISC ARTHROPLASTY N/A 10/23/2015   Procedure: Cervical Disc Arthroplasty Cervical six-seven;  Surgeon: Isadora Mar, MD;  Location: [facility];  Service: Neurosurgery;  Laterality: N/A;   D & C HYSTEROSCOPY W/ RESECTION ENDOMETRIAL POLYP  10-13-2003    dr rivard @[facility]    ESOPHAGOGASTRODUODENOSCOPY     for GERD   IR ANGIO INTRA EXTRACRAN SEL COM CAROTID INNOMINATE BILAT MOD SED  07/06/2020   IR ANGIO INTRA EXTRACRAN SEL INTERNAL CAROTID BILAT MOD SED  06/29/2020   IR ANGIO VERTEBRAL SEL VERTEBRAL BILAT MOD SED  06/29/2020   IR ANGIO VERTEBRAL SEL VERTEBRAL BILAT MOD SED  07/06/2020   LAPAROSCOPIC ABDOMINAL EXPLORATION  1996   dx ibs   OVARIAN CYST SURGERY  age 61   x2 cyst   RADIOLOGY WITH ANESTHESIA N/A 06/29/2020   Procedure: IR WITH ANESTHESIA;  Surgeon: Radiologist, Medication, MD;  Location: [facility];  Service: Radiology;  Laterality: N/A;   ROBOTIC ASSISTED TOTAL HYSTERECTOMY N/A 12/13/2017   Procedure: XI ROBOTIC ASSISTED TOTAL HYSTERECTOMYWITH BILATERAL SALPINGECTOMY;  Surgeon: Arlee Lace, MD;  Location: [facility];  Service: Gynecology;  Laterality: N/A;   TUBAL LIGATION Bilateral 06-01-2002   dr Adelene Homer @[facility]    [facility]   Past Medical History:  Diagnosis Date   Anxiety    Arthralgia    Generalized weakness    bilateral upper and lower extremities,  walks with cane   GERD (gastroesophageal reflux disease)    Headache(784.0)    Heart murmur    per pt "since childhood, no problems"   History of kidney stones    Hypertension    followed pcp   IBS (irritable bowel syndrome)    Iron deficiency anemia    12-06-2017 per pt recently had IV iron infusion July 2019   Low vitamin D level    Osteoarthritis    pt unsure about this   PVC (premature ventricular contraction)    per pt had holter monitor   SLE (systemic lupus erythematosus) (HCC)    rheumotologist-- dr syed    Stroke [facility]) 06/2020   Wears glasses    LMP 12/22/2017   Opioid Risk Score:   Fall Risk Score:  `1  Depression screen [facility] 2/9     08/23/2023   10:01 AM 04/19/2023   10:07 AM 12/14/2022   10:19 AM 08/10/2022   10:25 AM 01/19/2022   11:40 AM 11/10/2021    4:02 PM 08/18/2021    2:57 PM  Depression screen PHQ  2/9  Decreased Interest 0 0 0 0 0 0 0  Down, Depressed, Hopeless 0 0 0 0 0 0 0  PHQ - 2 Score 0 0 0 0 0 0 0     Review of Systems     Objective:   Physical Exam  General: No acute distress HEENT: NCAT, EOMI, oral membranes moist Cards: reg rate  Chest: normal effort Abdomen: Soft, NT, ND Skin: dry, intact Extremities: no edema Psych: pleasant and appropriate  Neuro: still talking with accent. Alert and oriented x 3.  Fair insioghjt. Cranial nerve exam unremarkable. MMT: 5/5 in all 4's. Fair but wide based gait. a.   Had pain with palpation of left temple. Triggered blanking/unresponsive episode where she wouldn't talk.  Stil speaks baby talk           Assessment & Plan:  1.  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             -continue with HEP and social reintegration---needs to be out more                        -has backed off some of exercise d/t h/a 2.  Left shoulder pain, bicipital tendonitis:              - appears improved.             3.  Seizure prophylaxis: keppra  500mg  bid---  4.  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  Migraine/vascular headaches---better             -topamax  100mg  bid--continue for now             -aimovig  injection 70mg  monthly--still hasn't received--will try for auth             -Maxalt -will try 10mg  prn for h/a              -anxiety component still a major factor             -relaxation/rest when needed  -checkCRP, ESR, ANA--r'o TA               6. Anxiety and depression, fatigue/sleep              -maintain concerta  at 36mg  daily--no rf needed today             -lexapro - maintain at 20mg  qhs-              -regular sleep > Continue trazodone              -family/social support remains solid             -continue reiterate need for social re-acclimation-needs to continue to pus             -neuropsych input of. Dr. Minerva Alvine appreciated-->f/u scheduled this fall          melatonin 10mg   7. Dizziness, soft bp's  -hold amlodipine  for now--- f/u with pcp.                  20  minutes of face to face patient care time were spent during this visit. All questions were encouraged and answered.  Follow up with me in 4 mos .

## 2023-08-24 LAB — ANA: Anti Nuclear Antibody (ANA): NEGATIVE

## 2023-08-24 LAB — SEDIMENTATION RATE: Sed Rate: 25 mm/h (ref 0–40)

## 2023-08-24 LAB — C-REACTIVE PROTEIN: CRP: 3 mg/L (ref 0–10)

## 2023-08-25 ENCOUNTER — Ambulatory Visit: Payer: Self-pay | Admitting: Physical Medicine & Rehabilitation

## 2023-08-27 ENCOUNTER — Emergency Department (HOSPITAL_COMMUNITY)

## 2023-08-27 ENCOUNTER — Other Ambulatory Visit: Payer: Self-pay

## 2023-08-27 ENCOUNTER — Encounter (HOSPITAL_BASED_OUTPATIENT_CLINIC_OR_DEPARTMENT_OTHER): Payer: Self-pay | Admitting: Emergency Medicine

## 2023-08-27 ENCOUNTER — Emergency Department (HOSPITAL_BASED_OUTPATIENT_CLINIC_OR_DEPARTMENT_OTHER)
Admission: EM | Admit: 2023-08-27 | Discharge: 2023-08-27 | Disposition: A | Discharge status: Home / Self Care | Attending: Emergency Medicine | Admitting: Emergency Medicine

## 2023-08-27 DIAGNOSIS — I611 Nontraumatic intracerebral hemorrhage in hemisphere, cortical: Secondary | ICD-10-CM | POA: Diagnosis not present

## 2023-08-27 DIAGNOSIS — Z982 Presence of cerebrospinal fluid drainage device: Secondary | ICD-10-CM | POA: Diagnosis not present

## 2023-08-27 DIAGNOSIS — R519 Headache, unspecified: Secondary | ICD-10-CM | POA: Diagnosis present

## 2023-08-27 DIAGNOSIS — R4701 Aphasia: Secondary | ICD-10-CM | POA: Diagnosis not present

## 2023-08-27 LAB — CBC WITH DIFFERENTIAL/PLATELET
Abs Immature Granulocytes: 0.01 10*3/uL (ref 0.00–0.07)
Basophils Absolute: 0 10*3/uL (ref 0.0–0.1)
Basophils Relative: 0 %
Eosinophils Absolute: 0.1 10*3/uL (ref 0.0–0.5)
Eosinophils Relative: 1 %
HCT: 39 % (ref 36.0–46.0)
Hemoglobin: 12.8 g/dL (ref 12.0–15.0)
Immature Granulocytes: 0 %
Lymphocytes Relative: 33 %
Lymphs Abs: 2.2 10*3/uL (ref 0.7–4.0)
MCH: 32.2 pg (ref 26.0–34.0)
MCHC: 32.8 g/dL (ref 30.0–36.0)
MCV: 98 fL (ref 80.0–100.0)
Monocytes Absolute: 0.6 10*3/uL (ref 0.1–1.0)
Monocytes Relative: 9 %
Neutro Abs: 3.7 10*3/uL (ref 1.7–7.7)
Neutrophils Relative %: 57 %
Platelets: 182 10*3/uL (ref 150–400)
RBC: 3.98 MIL/uL (ref 3.87–5.11)
RDW: 12.3 % (ref 11.5–15.5)
WBC: 6.6 10*3/uL (ref 4.0–10.5)
nRBC: 0 % (ref 0.0–0.2)

## 2023-08-27 LAB — BASIC METABOLIC PANEL WITH GFR
Anion gap: 12 (ref 5–15)
BUN: 14 mg/dL (ref 6–20)
CO2: 22 mmol/L (ref 22–32)
Calcium: 9.8 mg/dL (ref 8.9–10.3)
Chloride: 105 mmol/L (ref 98–111)
Creatinine, Ser: 0.86 mg/dL (ref 0.44–1.00)
GFR, Estimated: >60 mL/min (ref >60)
Glucose, Bld: 101 mg/dL — ABNORMAL HIGH (ref 70–99)
Potassium: 4.1 mmol/L (ref 3.5–5.1)
Sodium: 139 mmol/L (ref 135–145)

## 2023-08-27 MED ORDER — LORAZEPAM 2 MG/ML IJ SOLN: 0.5000 mg | Freq: Once | INTRAMUSCULAR | Status: DC

## 2023-08-27 NOTE — ED Notes (Signed)
 Patient transported to MRI

## 2023-08-27 NOTE — ED Notes (Signed)
 Called Amy Barry at [facility] for transport

## 2023-08-27 NOTE — ED Provider Notes (Signed)
 [place] EMERGENCY DEPARTMENT AT [facility] Provider Note   CSN: [id number] Arrival date & time: 08/27/23  1041     History  Chief Complaint  Patient presents with   Headache    Amy Barry is a 53 y.o. female.  53 year old female with prior history of aneurysm with resultant dysarthria presents due to worsening trouble speaking which began this morning.  According to the husband, patient at baseline has trouble getting the words out.  States that she has had this multiple times sometimes associated with stress.  Last night patient had some left-sided hip pain which she has quite often.  Was exacerbated after drinking a cold beverage.  She became slightly anxious according to the husband and had trouble speaking.  This resolved by the time she went to bed.  When she woke this morning, his wife feels like is more difficult to get the words out.  Has been feels like she has had this before in the past.  She denies any other focal weakness.  No severe headaches.  No emesis noted.  Symptoms have been persistent.       Home Medications Prior to Admission medications   Medication Sig Start Date End Date Taking? Authorizing Provider  acetaminophen  (TYLENOL ) 500 MG tablet Take 500 mg by mouth every 6 (six) hours as needed.    [provider]  acetaminophen -codeine  (TYLENOL  #3) 300-30 MG tablet Take 1 tablet by mouth every 6 (six) hours as needed. 12/14/22   Rawland Caddy, MD  amLODipine  (NORVASC ) 5 MG tablet Take 5 mg by mouth daily. 06/03/20   [provider]  diclofenac  Sodium (VOLTAREN ) 1 % GEL Apply 2 g topically 4 (four) times daily. 07/23/20   Love, Renay Carota, PA-C  Erenumab -aooe (AIMOVIG ) 70 MG/ML SOAJ Inject 70 mg into the skin every 30 (thirty) days. 04/19/23   Rawland Caddy, MD  escitalopram  (LEXAPRO ) 20 MG tablet TAKE 1 TABLET BY MOUTH EVERYDAY AT BEDTIME 04/19/23   Rawland Caddy, MD  folic acid  (FOLVITE ) 1 MG tablet Take 1 mg by mouth daily.  03/12/19   [provider]  furosemide  (LASIX ) 40 MG tablet Take 40 mg by mouth daily. 08/30/20   [provider]  KLOR-CON  M20 20 MEQ tablet Take 20 mEq by mouth daily. 03/09/21   [provider]  Lansoprazole (PREVACID PO)     [provider]  levETIRAcetam  (KEPPRA ) 500 MG tablet TAKE 1 TABLET BY MOUTH TWICE A DAY 06/26/23   Rawland Caddy, MD  Melatonin 10 MG CAPS Take by mouth daily.    [provider]  Menthol -Methyl Salicylate  (MENTHODERM) 10-15 % OINT     [provider]  methocarbamol  (ROBAXIN ) 500 MG tablet Take 1 tablet (500 mg total) by mouth every 6 (six) hours as needed for muscle spasms. 04/19/23   Rawland Caddy, MD  methylphenidate  (CONCERTA ) 36 MG PO CR tablet Take 1 tablet (36 mg total) by mouth daily. 08/23/23 08/22/24  Rawland Caddy, MD  metoprolol  (LOPRESSOR ) 100 MG tablet TAKE ONE TABLET BY MOUTH TWICE DAILY Patient taking differently: Take 100 mg by mouth 2 (two) times daily. 12/31/14   Donley Furth, MD  omeprazole  (PRILOSEC) 40 MG capsule daily. 08/10/20   [provider]  ondansetron  (ZOFRAN ) 4 MG tablet 1 tablet    [provider]  pantoprazole  (PROTONIX ) 40 MG tablet Take 1 tablet (40 mg total) by mouth daily. 04/13/22   Rawland Caddy, MD  polyethylene glycol  powder (GLYCOLAX /MIRALAX ) 17 GM/SCOOP powder Takes PRN    [provider]  Probiotic CHEW See admin instructions.    [provider]  rizatriptan  (MAXALT ) 10 MG tablet Take 1 tablet (10 mg total) by mouth daily as needed for migraine. May repeat in 2 hours if needed 08/23/23 08/23/24  Rawland Caddy, MD  saccharomyces boulardii (FLORASTOR) 250 MG capsule Take 1 capsule (250 mg total) by mouth 2 (two) times daily. Patient taking differently: Take 250 mg by mouth 2 (two) times daily. Takes daily only 07/22/20   Love, Renay Carota, PA-C  topiramate  (TOPAMAX ) 100 MG tablet TAKE 1 TABLET BY MOUTH TWICE A DAY 07/25/23   Rawland Caddy, MD  traZODone  (DESYREL ) 50 MG tablet TAKE 1 TABLET BY MOUTH EVERYDAY AT BEDTIME 06/21/23   Rawland Caddy, MD      Allergies    Sumatriptan, Duloxetine  hcl, Meperidine, Meperidine hcl, Metoclopramide , Oxycodone , Oxycodone -acetaminophen , Prozac [fluoxetine], Tramadol , Tramadol  hcl, Codeine , and Propoxyphene n-acetaminophen     Review of Systems   Review of Systems  All other systems reviewed and are negative.   Physical Exam Updated Vital Signs BP 118/74   Pulse 65   Temp 97.7 F (36.5 C) (Oral)   Resp 19   LMP 12/22/2017   SpO2 100%  Physical Exam Vitals and nursing note reviewed.  Constitutional:      General: She is not in acute distress.    Appearance: Normal appearance. She is well-developed. She is not toxic-appearing.  HENT:     Head: Normocephalic and atraumatic.  Eyes:     General: Lids are normal.     Conjunctiva/sclera: Conjunctivae normal.     Pupils: Pupils are equal, round, and reactive to light.  Neck:     Thyroid : No thyroid  mass.     Trachea: No tracheal deviation.  Cardiovascular:     Rate and Rhythm: Normal rate and regular rhythm.     Heart sounds: Normal heart sounds. No murmur heard.    No gallop.  Pulmonary:     Effort: Pulmonary effort is normal. No respiratory distress.     Breath sounds: Normal breath sounds. No stridor. No decreased breath sounds, wheezing, rhonchi or rales.  Abdominal:     General: There is no distension.     Palpations: Abdomen is soft.     Tenderness: There is no abdominal tenderness. There is no rebound.  Musculoskeletal:        General: No tenderness. Normal range of motion.     Cervical back: Normal range of motion and neck supple.  Skin:    General: Skin is warm and dry.     Findings: No abrasion or rash.  Neurological:     Mental Status: She is alert and oriented to person, place, and time. Mental status is at baseline.     GCS: GCS eye subscore is 4. GCS verbal subscore is 5. GCS motor subscore is 6.      Cranial Nerves: No cranial nerve deficit.     Sensory: No sensory deficit.     Motor: Motor function is intact.     Comments: Patient appears to have some expressive aphasia.  No facial droop noted.  Psychiatric:        Attention and Perception: Attention normal.        Mood and Affect: Affect is flat.     ED Results / Procedures / Treatments   Labs (all labs ordered are listed, but only abnormal results are displayed) Labs  Reviewed  CBC WITH DIFFERENTIAL/PLATELET  BASIC METABOLIC PANEL WITH GFR    EKG EKG Interpretation Date/Time:  Sunday Aug 27 2023 10:53:27 EDT Ventricular Rate:  68 PR Interval:  219 QRS Duration:  76 QT Interval:  372 QTC Calculation: 396 R Axis:   73  Text Interpretation: Sinus rhythm Ventricular premature complex Prolonged PR interval Low voltage, precordial leads Nonspecific T abnormalities, diffuse leads Confirmed by Lind Repine ([id number]) on 08/27/2023 10:58:58 AM  Radiology No results found.  Procedures Procedures    Medications Ordered in ED Medications - No data to display  ED Course/ Medical Decision Making/ A&P                                 Medical Decision Making Amount and/or Complexity of Data Reviewed Labs: ordered. ECG/medicine tests: ordered.   Patient's EKG shows normal sinus rhythm.  Patient's husband notes that patient speech is still not quite back to baseline.  I discussed the case with Dr. Cherylyn Cos, neurohospitalist on-call.  Recommends patient go to [facility] to have MRI and possible neurologic consultation to evaluate for possible seizure activity.  Discussed with Dr. Monnie Friedrich Harriott, hospital and he has accepted the patient.        Final Clinical Impression(s) / ED Diagnoses Final diagnoses:  None    Rx / DC Orders ED Discharge Orders     None         Lind Repine, MD 08/27/23 1218

## 2023-08-27 NOTE — ED Triage Notes (Signed)
 Pt alert with husband c/o sudden onset of loss of motor function and L side facial twitching which lasted approx 15 min denying LOC during episode. Pt c/o pain in L side of head which she saw PCP for last week. Pt has speech and memory deficit from CVA which she reports worsens intermittently which pt reports it has not improved since last night however husband states speech does this normally.

## 2023-08-27 NOTE — ED Notes (Signed)
Report called to [facility] ED Charge RN  

## 2023-08-27 NOTE — Discharge Instructions (Signed)
 Please follow-up with your neurologist in regards to recent symptoms and ER visit.  Today your MRI does not show any acute changes and so we recommend you follow-up closely with your neurologist and primary care provider.  If symptoms change or worsen please return to the ER.

## 2023-08-27 NOTE — ED Provider Notes (Signed)
 Received in transfer [facility] from [place], [place].  Please review their note for the workup thus far.  The plan moving forward is for patient to get an MRI and reevaluate.  I went to evaluate the patient and patient does have expressive aphasia with me.  Patient's husband is bedside to provide history and states that patient has episodes of this before and last time after she had a cold drink she had expressive aphasia that was worse than normal for about a week and then it returned.  Patient's husband states that patient has this from time to time and patient called daughter who confirmed this.  Symptoms began yesterday after having a smoothie. Will get MRI per previous provider and reevaluate.  MRI does not show any acute changes.  Husband and wife would like to be discharged which is reasonable.  Patient does not see neurology so we will give neurology follow-up.  Patient given return precautions.  Patient stable to be discharged.  Patient not in agreement to this plan and husband verbalized agreement as well.  Staffed with Nolia Baumgartner, MD   Denese Finn, PA-C 08/27/23 Tennis Feinstein    Hershel Los, MD 08/27/23 [phone]

## 2023-09-02 DIAGNOSIS — I609 Nontraumatic subarachnoid hemorrhage, unspecified: Secondary | ICD-10-CM | POA: Diagnosis not present

## 2023-09-02 DIAGNOSIS — I10 Essential (primary) hypertension: Secondary | ICD-10-CM | POA: Diagnosis not present

## 2023-09-05 ENCOUNTER — Encounter: Payer: Self-pay | Admitting: Neurology

## 2023-09-07 ENCOUNTER — Encounter: Payer: Self-pay | Admitting: Psychology

## 2023-09-07 NOTE — Progress Notes (Signed)
 Neuropsychology Visit  Patient:  Amy Barry   DOB: [birth year]  MR Number: [id number]  Location: [facility] [address] [place], [address] [place] [address] Dept: [phone]  Date of Service: 08/10/2023  Start: 4 PM End: 5 PM  Today's visit was conducted in my outpatient clinic office with the patient and her husband present.  The patient's husband provided some context to what was going on but generally stayed out of the conversation for the most part although he was able to provide some helpful information at various times.  Duration of Service: 1 Hour  Provider/Observer:     Marrion Sjogren PsyD  Chief Complaint:      Chief Complaint  Patient presents with   Memory Loss   Anxiety   Depression   Cerebrovascular Accident    Reason For Service:     Amy Barry is a 53 year old female referred for neuropsychological/psychological consult due to ongoing issues with cognitive functioning, expressive language functioning and anxiety and stress associated with post cerebrovascular event symptoms.  Patient has a past medical history that is included hypertension, irritable bowel syndrome, anemia, undetermined connective tissue disease disorder and family history of cerebral aneurysms.  Patient had been followed previous to her cerebrovascular accident by neurology because of changes and abnormalities in gait, weakness and headache.  Patient had an MRI conducted in 2021 prior to her cerebrovascular accident with an interpretive impression of a normal exam with no changes noted prior to previous MRI in 2018.  Patient was admitted on 06/29/2020 after being found down by her daughter with slurred speech and confusion.  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  Hemorrhage with surrounding edema noted in right frontal lobe area.   Patient was treated on the inpatient rehabilitation unit where I saw her on 07/17/2020.  The patient has continued to have difficulties with memory with the primary memory difficulties appear to be related to retrieval of information rather than an inability to store or organize new information.  The patient has significant variations in expressive language with significant times of profound nonfluent aphasia and other times where she is able to speak much more clearly but continues to have articulation changes from prior to her cerebrovascular event.  The patient also has a significant amount of anxiety and stress associated with her difficulties.  The patient continues to have motor deficits but these were present for the most part prior to this cerebrovascular accident related to her previous or pre-CVA neurological symptoms.   During the clinical interview on 05/30/2022, the patient and her husband both acknowledged changes in speech, memory and vision.  The patient has had review by ophthalmology stating that there was not a lot they could do about her vision changes.  The patient herself reports that she is having difficulty remembering to do things and remembering what was talked about previously.  The patient reports that she is trying to write things down and make lists but then forgets about the list itself.  The patient has worked with speech therapy around strategies to manage with her memory difficulties and sets alarms on their phones.  The patient reports that it does not work every day and she forgets about events.  Patient reports that she forgets to pass messages along to family members.  Patient describes significant speech with significant variability in capacity.  The patient reports that at baseline she does not talk like she did before and has an odd accident to her speech.  She reports that in her head that she talks like she has always talked but knows that it comes out differently than she  is intending.  The patient reports that she has difficulty finding the words and executing the pronunciation of words and getting grammar correct.  There are other times where her speech becomes almost completely nonfluent.  Patient reports that noise and stress can trigger the symptoms and that others will notice sometimes a tear forming or she becomes very fidgety.  The patient reports that she becomes almost paralyzed and cannot do anything or say anything.  She even reports that there are times when the side of her face will pull.  She reports that these events last between 5 minutes to 1 hour.  Patient described a significant event this past December where she had a significant change in her speech where it went to "gibberish."  Patient reports that in her mind it sounds like she is speaking okay but others hear gibberish words.  Patient reports that she does not feel particularly anxious during these times and sometimes they can happen just out of the blue.  Patient reports that when things are quiet and calm it can be almost "normal."  Patient reports that for the first part after her CVA in 2022 that she spent most of her time in her room upstairs in their house where a humidifier was making a white noise.  The patient reports that she did better there but when she would come downstairs with noise and things like TV going that he will completely throw her off.   During the clinical interview on 05/30/2022, the patient's husband reports that she has made significant improvements and that the primary difference for now versus before the stroke are these acute episodes with her speech and expressive language changes.  The patient's husband reports that there has been some loss of memory capacity but as described by the patient herself this appears to be more having to do with retrieval of information and an inability to store and organize new information.  The patient's husband reports that she is now doing  things that initially were hindering her progress.  She is beginning to come down and spend more time with others and doing better the more time she spends with others.  The patient has begun fixing her own meals, dressing herself, keeping up with her medicines etc.  Patient's husband reports that cueing does help her with her memory but that she is constantly worrying that she might forget something or worrying about her speech.  Worry and self observation appear to exacerbate her expressive language.     Medical History:                         Past Medical History:  Diagnosis Date   Anxiety     Arthralgia     Generalized weakness      bilateral upper and lower extremities,  walks with cane   GERD (gastroesophageal reflux disease)     Headache(784.0)     Heart murmur      per pt "since childhood, no problems"   History of kidney stones     Hypertension      followed pcp   IBS (irritable bowel syndrome)     Iron deficiency anemia  12-06-2017 per pt recently had IV iron infusion July 2019   Low vitamin D level     Osteoarthritis      pt unsure about this   PVC (premature ventricular contraction)      per pt had holter monitor   SLE (systemic lupus erythematosus) (HCC)      rheumotologist-- dr syed   Stroke [facility]) 06/2020   Wears glasses                                                                 Patient Active Problem List    Diagnosis Date Noted   Generalized anxiety disorder 05/05/2021   Biceps tendonitis on left 02/10/2021   Vascular headache 08/05/2020   Hyponatremia 07/28/2020   Headache 07/24/2020   Neck pain, musculoskeletal 07/24/2020   Cognitive deficits     Frontal lobe and executive function deficit     Subarachnoid bleed (HCC) 07/09/2020   Subarachnoid hemorrhage (HCC) 06/29/2020   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HCC) 03/04/2019   S/P hysterectomy 12/13/2017   Abnormal laboratory test 09/09/2016   Gait abnormality 08/03/2016   Chronic fatigue 08/03/2016    Paresthesia 08/03/2016   S/P cervical spinal fusion 10/23/2015   Hypersomnia 12/08/2010   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   EPIGASTRIC PAIN 10/13/2009   IRRITABLE BOWEL SYNDROME 03/10/2009   BACK PAIN, LUMBAR 07/01/2008   COSTOCHONDRITIS 02/01/2008   VIRAL URI 01/08/2008   CHEST PAIN 01/08/2008   OTHER SPECIFIED EPISODIC MOOD DISORDER 12/25/2007   ARM PAIN 12/25/2007   ANEMIA-NOS 10/02/2007   Essential hypertension 10/02/2007   GERD 10/02/2007   POLYP, GALLBLADDER 10/02/2007   UTERINE POLYP 10/02/2007   OSTEOARTHRITIS 10/02/2007   Dyskinesia 10/02/2007   HEADACHE 10/02/2007   PALPITATIONS 10/02/2007   CARDIAC MURMUR, HX OF 10/02/2007   NEPHROLITHIASIS, HX OF 10/02/2007      Onset and Duration of Symptoms: The patient was having neurological issues even before CVA because of changes and abnormalities in gait, weakness and headache.  Expressive language deficits and memory changes developed after her 2022 CVA.   Progression of Symptoms: Patient has made improvements but continues to have acute events with significant profound changes in expressive language.   Triggering Factors: Noise and stress.   Associated Symptoms (e.g., cognitive, emotional, behavioral): Memory retrieval deficits, anxiety and hypervigilance and avoidance behaviors   Additional Tests and Measures from other records:   Neuroimaging Results: Along with previous imaging showing indications of her right frontal bleed patient also had a follow-up MRA conducted on 11/25/2020.  There was a question about a 2 mm saccular outpouching extending medially and inferiorly from the para thalamic left ICA that may reflect a small aneurysm but the finding was indeterminate.  CT scan conducted on 06/29/2020 did show subarachnoid hemorrhage within the basal cisterns and ventricular system   Current Typical Mood State:  Anxious   Sleep:  The patient describes sleep as having improved more recently.  The patient reports that she is  now sleeping through the night since there was an increase in Lexapro .     Diet Pattern:  Patient describes her appetite is good most days.  Treatment Interventions:  Cognitive/behavioral interventions and building coping skills and strategies around residual effects of her cerebrovascular accident including expressive language, motor deficits and abnormalities in gait,  weakness and post cerebrovascular accident headaches.  Participation Level:   Active  Participation Quality:  Appropriate      Behavioral Observation:  Well Groomed, Alert, and Appropriate.   Current Psychosocial Factors: The patient reports continued stress when being outside of the home.  She reports continuation of good days and bad days particularly around her expressive language deficits.  Both the patient and her husband report that she has times where her expressive language capacities are much more fluent at home but still impaired.  When the patient is around the public she becomes increasingly apprehensive and worried about what other people think of her.  She expressed her described a number of situations where people thought that she was from somewhere other than local and that she was not a native [place] speaker.  The patient reports that this embarrassed her exacerbated her difficulties.  This became her primary focus today of the therapeutic interventions.  Content of Session:   Reviewed current symptoms and continue to work on therapeutic interventions around coping and adjustment post cerebrovascular accident with anxiety and depressive type symptoms.  Effectiveness of Interventions: The patient has been an active to spent in these therapeutic interventions and rapport has been easy to establish.  The patient remains quite motivated and engaged in therapeutic process.  Target Goals:   Today we began working on some of the primary and fundamental coping strategies particular around improving overall sleep function  and developing strategies to compensate and work around residual cognitive and motor deficits.  Goals Last Reviewed:   08/10/2023  Goals Addressed Today:    Today we continue to work on foundational aspects of coping and adjustment post cerebrovascular accident and the patient has already begun increasing physical activity working on a treadmill and we talked about ways of progressing to going for walks outside.  The patient is spending more time in purposeful efforts and direct communication with others rather than avoiding talking due to her changes in expressive language functions.  Outside of working on continued with her physical improvement as far as gait and strength we also worked on how to better manage the emotional impact of her expressive language deficits and concerns about what other people were "thinking" about her.  Impression/Diagnosis:   Amy Barry is a 53 year old female referred for neuropsychological/psychological consult due to ongoing issues with cognitive functioning, expressive language functioning and anxiety and stress associated with post cerebrovascular event symptoms.  Patient has a past medical history that is included hypertension, irritable bowel syndrome, anemia, undetermined connective tissue disease disorder and family history of cerebral aneurysms.  Patient had been followed previous to her cerebrovascular accident by neurology because of changes and abnormalities in gait, weakness and headache.  Patient had an MRI conducted in 2021 prior to her cerebrovascular accident with an interpretive impression of a normal exam with no changes noted prior to previous MRI in 2018.  Patient was admitted on 06/29/2020 after being found down by her daughter with slurred speech and confusion.  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  Hemorrhage with surrounding edema noted in right frontal lobe area.  Patient was treated  on the inpatient rehabilitation unit where I saw her on 07/17/2020.  The patient has continued to have difficulties with memory with the primary memory difficulties appear to be related to retrieval of information rather than an inability to store or organize new information.  The patient has significant  variations in expressive language with significant times of profound nonfluent aphasia and other times where she is able to speak much more clearly but continues to have articulation changes from prior to her cerebrovascular event.  The patient also has a significant amount of anxiety and stress associated with her difficulties.  The patient continues to have motor deficits but these were present for the most part prior to this cerebrovascular accident related to her previous or pre-CVA neurological symptoms.  03/22/2023: During today's visit, the patient had more expressive language issues denied seeing previously.  The patient and husband were both present and reports that she has been having more episodes of acute worsening with regard to expressive language.  However, they both note that when things are calm at home she returns back to her baseline.  As the visit today progressed she began returning to her baseline and it does appear that stresses likely contributing or causing this acute change rather than any new cerebrovascular events.  We continue to work on coping and adjustment strategies.  07/05/2023: Today we worked on coping and adjustment issues with significant expressive language deficits and psychomotor deficits.  The patient continues to describe significant variability in her expressive language capacity highly correlated with the situation she is having.  Diagnosis:   Generalized anxiety disorder  Subarachnoid hemorrhage (HCC)  Frontal lobe and executive function deficit  Reactive depression  Gait abnormality    Chapman Commodore, Psy.D. Clinical  Psychologist Neuropsychologist

## 2023-09-22 DIAGNOSIS — H524 Presbyopia: Secondary | ICD-10-CM | POA: Diagnosis not present

## 2023-09-22 DIAGNOSIS — H2513 Age-related nuclear cataract, bilateral: Secondary | ICD-10-CM | POA: Diagnosis not present

## 2023-09-22 DIAGNOSIS — H40013 Open angle with borderline findings, low risk, bilateral: Secondary | ICD-10-CM | POA: Diagnosis not present

## 2023-10-09 ENCOUNTER — Other Ambulatory Visit: Payer: Self-pay

## 2023-10-09 DIAGNOSIS — I609 Nontraumatic subarachnoid hemorrhage, unspecified: Secondary | ICD-10-CM

## 2023-10-09 MED ORDER — METHYLPHENIDATE HCL ER (OSM) 36 MG PO TBCR: 36.0000 mg | EXTENDED_RELEASE_TABLET | Freq: Every day | ORAL | 0 refills | Status: DC

## 2023-10-09 NOTE — Telephone Encounter (Signed)
 Amy Barry is requesting a refill of Methylphenidate  to be sent to [facility] on [address].  Please send or advise. Thank you.

## 2023-10-10 NOTE — Progress Notes (Unsigned)
 NEUROLOGY CONSULTATION NOTE  Amy Barry MRN: [id number] DOB: September 10, 1970  Referring provider: Lynwood Bugler, PA-C (ED referral) Primary care provider: Vyvyan Sun, MD  Reason for consult:  headache  Assessment/Plan:   Headache - cervicogenic/post-traumatic vs migraine Recurrent episodes of transient altered awareness presenting with aphasia and catatonia-like symptoms. Seizure seems less likely History of subarachnoid hemorrhage.  Unclear etiology.  It was not traumatic and cerebral angiogram was negative for vascular abnormality such as aneurysm over vasospasm MRA from 11/2020 indicated possible tiny aneurysm On levetiracetam  since hospitalized for the [facility].  It may have been started by neurosurgery empirically but it may not be indicated at this time.  Again, I think that her spells are likely not epileptic.    Regarding transient spells: Check 1 hour EEG.  If unremarkable, would order ambulatory EEG in attempt to capture spell Pending results, consider discontinuing levetiracetam  Regarding headache: Will prescribe and try to get Aimovig  70mg  approved.  She has already been on topiramate  and beta blocker Refer to PT for treatment of neck pain possibly contributing to headache Regarding possible aneurysm seen on prior MRA: Check CTA head PT to help with gait as well Follow up in 8 months.  Total time spent in chart and face to face with patient:  68 minutes   Subjective:  Amy Barry is a 53 year old emale who presents for headache.  History supplemented by her accompanying husband and hospital records.  MRIs and CTA personally reviewed.  She had a subarachnoid hemorrhage of unknown etiology in March 2022 presenting as the worse headache of her life.  In the hospital, CTA of head and cerebral angiogram did not reveal aneurysm or other vascular abnormality.  She did require a ventriculostomy.  Afterwards, she underwent extensive rehab.    She reports headaches since then, but  have been worse over the past few months.  It is a left sided sharp headache starting at the back of her head and neck and radiating to the temple and eye/frontal region.  Sometimes associated with nausea.  No associated photophobia or phonophobia.  It can be severe for 2-3 days untreated and then dull and becomes sleepy for a few days.  It occurs about 2 to 3 times a month.  Treats with Tylenol  twice a day.  Dr. Babs tried to prescribe Aimovig  but was denied by her insurance.  She has transient recurrent episodes since 2018 after developing multiple unexplained symptoms such as extreme fatigue, numbness and weakness, as further described below.  She will became extremely fatigued and unable to speak, move or respond.  This can last 5 to 10 minutes.  They used to occur frequently but now occurs when triggered, such as after drinking a cold beverage or when feeling increased anxiety.  In December 2023, she had a spell but the symptoms never fully resolved.  She was seen in the ED where CT head was stable without new or acute findings.  Since then, she continues to struggle speaking at times and has an altered accent. She was most recently seen in the ED on 08/27/2023 for a recurrence of this aphasia that started after drinking a smoothie.  he had an MRI of the brain which showed stable left frontal chronic hemorrhagic encephalomalacia but no acute findings..  She has been on levetiracetam  since the [facility].  She has never had a seizure.    She has a complex past medical history.   Fatigue/numbness/weakness: She began experiencing extreme fatigue around February  2018.  At that time, she began experiencing right leg weakness followed by left leg weakness.  She also reported numbness and tingling.  She started feeling unsteady on her feet.  Over the course of 2 to 3 weeks, she gradually became weaker, involving the arms and then became almost paralyzed.  She was then admitted to [facility] that May.  At the  time, she began to experience right leg numbness and weakness, in which her leg would sometimes give out.  She started feeling unsteady on her feet.  Weakness gradually generalized, involving all extremities.  She has history of decompressive anterior cervical discectomy at C6-7 and cervical disc arthroplasty C6-7 in 2017 after presenting with left sided neck and arm pain.  She continued to have some residual left arm numbness since the surgery.  She was evaluated by neurologist, Dr. Onita, in 2018.  MRIs of brain, cervical, thoracic and lumbar spine revealed no cause for her worsening gait.  Labs were overall unremarkable.  Acetylcholine receptor antibody, copper , ferritin, CPK, HIV, RPR, HIV were negative.  EB virus IgG was positive but IgM was negative.  ESR was mildly elevated at 50, but this has been chronic for several years (since 2016) and ANA and CRP were negative.  NCV-EMG was normal.  She was admitted to [facility] in May of 2018 after 2 to 3 week history of significant worsening weakness, to the point where she was almost paralyzed from the neck down.  Deep tendon reflexes were brisk.  She exhibited tremor and dystonic movements of her feet.  Thorough exam by [facility] neurology found her exam inconsistent and determined that her symptoms were functional.  She was discharged with PT/OT.  She followed up with [facility] neurologist Dr. Camellia Custard.  She underwent a lumbar puncture on in August 2018, which was unremarkable with no bands, elevated IgG index, malignancy, infection.  Serology for Lyme, HTLV I/II antibodies, NMO antibodies and paraneoplastic panel were also negative.  Since then, she has continued to have bilateral leg weakness, left greater than right, dystonic movements of her feet, diffuse muscle and joint pain and numbness and tingling as well as unsteady gait.  She has been followed by rheumatology and possible underlying lupus has been entertained.  Over the past 6 months, she states she started  experiencing urinary incontinence and intermittent episodes of complete right sided hemisensory loss from head down to toes, lasting up to 3 days at a time.  She has since seen Dr. Efrain of infectious disease in November 2020.  Hepatitis B and C, HIV, SPEP, CK, LDH were negative.  HLA-B27 antigen was positive.  CRP was elevated at 13 but ESR was further elevated at 117.  Due to the more elevated sed rate, her rheumatologist had started her on methotrexate . She denies family history of neurologic or rheumatologic disorders (other than osteoarthritis).    She underwent extensive workup. IMAGING: 10/27/2011 MRI CERVICAL SPINE WO:  Tiny central disc bulges from C2-3 through C4-5 with no significant neural impingement. 06/19/2012 CTA HEAD:  Negative 09/12/2015 MRI CERVICAL SPINE WO:  Progressed cervical spondylosis and degenerative disc disease with left foraminal disc protrusion and nerve impingement at C6-7, moderate impingement at C3-4 and C4-5 and mild impingement at C5-6. 08/03/2016 MRI BRAIN WO:  Unremarkable 08/03/2016 MRI CERVICAL SPINE WO:  Status post arthrodesis at C6-7 with questionable posterior migration of C6-7 implant, mild to moderate canal stenosis with mild cord flattening without definite cord signal changes; moderate degenerative spondylosis with  disc protrusions at C3-4, C4-5, and C5-6. 08/04/2016 MRI CERVICAL SPINE WO:  C6-7 arthrodesis now appears in place. 10/06/2016 MRI LUMBAR SPINE WO:  Degenerative disc disease and mild bilateral foraminal narrowing at L5-S1 without significant compression; mild ligamentum flavum hypertrophy and facet degenerative facet changes at L3-4 and L4-5 without compression. 03/04/2019 X-RAY BILATERAL HIP/PELVIS:  Mild degenerative changes both hips.  Otherwise, unremarkable. 05/18/2019 MRI BRAIN W WO:  Normal exam.  No change since 2018. 05/18/2019 MRI C-SPINE W WO:  C3-4: Chronic central disc herniation effaces the ventral subarachnoid space and indents  the ventral cord. AP diameter of the canal in the midline only 6.5 mm. Similar appearance to the study of 2018, possibly with minimal enlargement of the disc. C4-5: Chronic central disc herniation effaces the ventral subarachnoid space and indents the ventral cord. AP diameter of the canal in the midline is 8 mm. Similar appearance to the study of 2018, possibly with minimal enlargement of the disc.  C5-6: Mild noncompressive disc bulge.  C6-7: Previous disc arthroplasty. Extensive artifact precludes MR evaluation of the canal or foramina at this level. 06/21/2019 MRI T-SPINE W WO:  Normal thoracic spine MRI. No finding to explain the patient's symptoms. 06/21/2019 MRI L-SPINE W WO:  Mild degenerative disc disease L3-4 and L5-S1 without central canal or foraminal stenosis. The examination is otherwise negative. No finding to explain the patient's symptoms. 06/29/2020 CT HEAD:  Subarachnoid hemorrhage in the posterior fossa with aneurysmal pattern. There is intraventricular reflux and ventriculomegaly. 06/29/2020 CTA HEAD &  NECK:  No definite source of subarachnoid hemorrhage but there poor visualization of the proximal left PICA with a tiny bulbous area, question ruptured dissection. 07/06/2020 CEREBRAL ANGIOGRAM:  1. No intracranial aneurysms, arteriovenous malformations, or  fistulas are identified. 2. Mild, nonflow limiting vasospasm involving the supraclinoid  segment of the internal carotid arteries bilaterally.  11/25/2020 MRA HEAD WO:  1. Question 2 mm saccular outpouching extending medially and inferiorly from the paraophthalmic left ICA. Finding is indeterminate, but could reflect a small paraophthalmic aneurysm. This is not definitely seen on previous exams. 2. Otherwise normal intracranial MRA. 08/27/2023 MRI BRAIN WO:  1. No acute intracranial abnormality. 2. 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    LABS: April  2018:  Elevated ESR 42; elevated EBV antibody IgG over 600, elevated EB virus early antigen antibody 11.1, elevated EBV nuclear antigen antibody IgG 25; elevated parvovirus B19 IgG antibody 5.2; elevated CMV IgG antibody 5.9; vitamin D 36.5; low iron 41; negative ANA, CRP 4.3, vitamin B12 256, TSH 1.86. May 2018:  Ach receptor antibody negative, ANA negative, mildly elevated ESR 50; CRP 4.4; CK 54; copper  109; ferritin 16; B12 359; folate 11.2; RPR nonreactive; HIV nonreactive June 2018:  Sed rate 29; CRP 4.8; CK 61 August 2018:  CSF cell count 0, protein 22, glucose 57, cytospin negative, ACE <0.4, 0 oligoclonal bands, negative myelin basic protein, negative VDRL August 2018:  Negative Lyme serology, negative paraneoplastic panel, negative NMO-IgG antibody, HTLV I/II antibodies November 2020:  Sed rate 117; CRP 13; SPEP normal; CK 77; HIV 1/2 negative; Hep B surface Ag nonreactive; Hep C antibody nonreactive; HLA-B27 antigen positive   OTHER TESTING: 09/09/2016 NCV-EMG:  Normal.  No evidence of large fiber peripheral neuropathy, right lumbosacral radiculopathy, right cervical radiculopathy or inflammatory myopathy.  Past NSAIDS/analgesics:  ibuprofen  Past abortive triptans:  none Past abortive ergotamine:  none Past muscle relaxants:  none Past anti-emetic:  none Past  antihypertensive medications:  none Past antidepressant medications:  none Past anticonvulsant medications:  gabapentin  Past anti-CGRP:  none  Current NSAIDS/analgesics:  Tylenol  Current triptans:  rizatriptan  10mg  (ineffective, makes her drowsy) Current ergotamine:  none Current anti-emetic:  Zofran  4mg  Current muscle relaxants:  methocarbamol  500mg  PRN Current Antihypertensive medications:  metoprolol , amlodipine  Current Antidepressant medications:  escitalopram  20mg   Current Anticonvulsant medications:  topiramate  100mg  daily (previously on twice daily), levetiracetam  500mg  twice daily Current anti-CGRP:  none Other  medications:  trazodone , methylphenidate      PAST MEDICAL HISTORY: Past Medical History:  Diagnosis Date   Anxiety    Arthralgia    Generalized weakness    bilateral upper and lower extremities,  walks with cane   GERD (gastroesophageal reflux disease)    Headache(784.0)    Heart murmur    per pt since childhood, no problems   History of kidney stones    Hypertension    followed pcp   IBS (irritable bowel syndrome)    Iron deficiency anemia    12-06-2017 per pt recently had IV iron infusion July 2019   Low vitamin D level    Osteoarthritis    pt unsure about this   PVC (premature ventricular contraction)    per pt had holter monitor   SLE (systemic lupus erythematosus) (HCC)    rheumotologist-- dr syed   Stroke (HCC) 06/2020   Wears glasses     PAST SURGICAL HISTORY: Past Surgical History:  Procedure Laterality Date   CERVICAL DISC ARTHROPLASTY N/A 10/23/2015   Procedure: Cervical Disc Arthroplasty Cervical six-seven;  Surgeon: Alm GORMAN Molt, MD;  Location: [facility];  Service: Neurosurgery;  Laterality: N/A;   D & C HYSTEROSCOPY W/ RESECTION ENDOMETRIAL POLYP  10-13-2003    dr rivard @[facility]    ESOPHAGOGASTRODUODENOSCOPY     for GERD   IR ANGIO INTRA EXTRACRAN SEL COM CAROTID INNOMINATE BILAT MOD SED  07/06/2020   IR ANGIO INTRA EXTRACRAN SEL INTERNAL CAROTID BILAT MOD SED  06/29/2020   IR ANGIO VERTEBRAL SEL VERTEBRAL BILAT MOD SED  06/29/2020   IR ANGIO VERTEBRAL SEL VERTEBRAL BILAT MOD SED  07/06/2020   LAPAROSCOPIC ABDOMINAL EXPLORATION  1996   dx ibs   OVARIAN CYST SURGERY  age 28   x2 cyst   RADIOLOGY WITH ANESTHESIA N/A 06/29/2020   Procedure: IR WITH ANESTHESIA;  Surgeon: Radiologist, Medication, MD;  Location: [facility] OR;  Service: Radiology;  Laterality: N/A;   ROBOTIC ASSISTED TOTAL HYSTERECTOMY N/A 12/13/2017   Procedure: XI ROBOTIC ASSISTED TOTAL HYSTERECTOMYWITH BILATERAL SALPINGECTOMY;  Surgeon: Rosalva Sawyer, MD;  Location: [facility];  Service: Gynecology;  Laterality:  N/A;   TUBAL LIGATION Bilateral 06-01-2002   dr henry @[facility]    PPTL    MEDICATIONS: Current Outpatient Medications on File Prior to Visit  Medication Sig Dispense Refill   acetaminophen  (TYLENOL ) 500 MG tablet Take 500 mg by mouth every 6 (six) hours as needed.     acetaminophen -codeine  (TYLENOL  #3) 300-30 MG tablet Take 1 tablet by mouth every 6 (six) hours as needed. 50 tablet 0   amLODipine  (NORVASC ) 5 MG tablet Take 5 mg by mouth daily.     diclofenac  Sodium (VOLTAREN ) 1 % GEL Apply 2 g topically 4 (four) times daily. 150 g 0   Erenumab -aooe (AIMOVIG ) 70 MG/ML SOAJ Inject 70 mg into the skin every 30 (thirty) days. 1.12 mL 4   escitalopram  (LEXAPRO ) 20 MG tablet TAKE 1 TABLET BY MOUTH EVERYDAY AT BEDTIME 90 tablet 3   famotidine (PEPCID)  40 MG tablet Take 40 mg by mouth daily.     folic acid  (FOLVITE ) 1 MG tablet Take 1 mg by mouth daily.     furosemide  (LASIX ) 40 MG tablet Take 40 mg by mouth daily.     KLOR-CON  M20 20 MEQ tablet Take 20 mEq by mouth daily.     Lansoprazole (PREVACID PO)      levETIRAcetam  (KEPPRA ) 500 MG tablet TAKE 1 TABLET BY MOUTH TWICE A DAY 180 tablet 2   Melatonin 10 MG CAPS Take by mouth daily.     Menthol -Methyl Salicylate  (MENTHODERM) 10-15 % OINT      methocarbamol  (ROBAXIN ) 500 MG tablet Take 1 tablet (500 mg total) by mouth every 6 (six) hours as needed for muscle spasms. 120 tablet 3   methylphenidate  (CONCERTA ) 36 MG PO CR tablet Take 1 tablet (36 mg total) by mouth daily. 30 tablet 0   metoprolol  (LOPRESSOR ) 100 MG tablet TAKE ONE TABLET BY MOUTH TWICE DAILY (Patient taking differently: Take 100 mg by mouth 2 (two) times daily.) 180 tablet 3   omeprazole  (PRILOSEC) 40 MG capsule daily.     ondansetron  (ZOFRAN ) 4 MG tablet 1 tablet     oseltamivir (TAMIFLU) 75 MG capsule Take 75 mg by mouth 2 (two) times daily.     pantoprazole  (PROTONIX ) 40 MG tablet Take 1 tablet (40 mg total) by mouth daily. 30 tablet 7   polyethylene glycol powder  (GLYCOLAX /MIRALAX ) 17 GM/SCOOP powder Takes PRN     Probiotic CHEW See admin instructions.     rizatriptan  (MAXALT ) 10 MG tablet Take 1 tablet (10 mg total) by mouth daily as needed for migraine. May repeat in 2 hours if needed 30 tablet 2   saccharomyces boulardii (FLORASTOR) 250 MG capsule Take 1 capsule (250 mg total) by mouth 2 (two) times daily. (Patient taking differently: Take 250 mg by mouth 2 (two) times daily. Takes daily only)     topiramate  (TOPAMAX ) 100 MG tablet TAKE 1 TABLET BY MOUTH TWICE A DAY 180 tablet 2   traZODone  (DESYREL ) 50 MG tablet TAKE 1 TABLET BY MOUTH EVERYDAY AT BEDTIME 90 tablet 4   No current facility-administered medications on file prior to visit.    ALLERGIES: Allergies  Allergen Reactions   Sumatriptan Other (See Comments)    High blood pressure Other reaction(s): Hypertension, Unknown Other reaction(s): high bp, Unknown   Duloxetine  Hcl     Other reaction(s): irritable   Meperidine Nausea And Vomiting    Other reaction(s): Unknown Other reaction(s): Unknown   Meperidine Hcl     Other reaction(s): nausea/ Vomiting   Metoclopramide  Other (See Comments)    shakiness Other reaction(s): Unknown Other reaction(s): shakiness, Unknown   Oxycodone  Other (See Comments)    DROWSY   Oxycodone -Acetaminophen      Other reaction(s): Unknown Other reaction(s): Unknown   Prozac [Fluoxetine]     Other reaction(s): jittery   Tramadol  Nausea Only   Tramadol  Hcl     Other reaction(s): nausea   Codeine  Palpitations    Other reaction(s): Nausea/Vomiting, Unknown Other reaction(s): Unknown   Propoxyphene N-Acetaminophen  Nausea And Vomiting    FAMILY HISTORY: Family History  Problem Relation Age of Onset   Hypertension Mother        hx aneursym and surgery   Atrial fibrillation Mother    Sarcoidosis Mother    Anuerysm Mother        brain   Arthritis Mother    Healthy Father    Asthma Sister  Leukemia Maternal Grandmother    Heart disease  Maternal Grandmother    Stroke Paternal Grandmother    Hypertension Daughter    Colon polyps Daughter 29       hermatoma oversized polyps-benign   Hypertension Daughter    Hyperlipidemia Other    Colon cancer Neg Hx     Objective:  Blood pressure 124/67, pulse 66, height 5' 1 (1.549 m), weight 164 lb (74.4 kg), SpO2 98%. General: No acute distress.  Patient appears well-groomed.   Head:  Normocephalic/atraumatic Eyes:  fundi examined but not visualized Neck: supple, no paraspinal tenderness, full range of motion Back: No paraspinal tenderness Heart: regular rate and rhythm Lungs: Clear to auscultation bilaterally. Vascular: No carotid bruits. Neurological Exam: Mental status: alert and oriented to person, place, and time, speech fluent with some pauses and not dysarthric, language intact. Cranial nerves: CN I: not tested CN II: pupils equal, round and reactive to light, visual fields intact CN III, IV, VI:  full range of motion, no nystagmus, no ptosis CN V: facial sensation intact. CN VII: upper and lower face symmetric CN VIII: hearing intact CN IX, X: gag intact, uvula midline CN XI: sternocleidomastoid and trapezius muscles intact CN XII: tongue midline Bulk & Tone: normal, no fasciculations. Motor:  muscle strength 5/5 throughout Sensation:  Pinprick and vibratory sensation intact. Deep Tendon Reflexes:  2+ throughout,  toes downgoing.   Finger to nose testing:  Without dysmetria.   Heel to shin:  Without dysmetria.   Gait:  Cautious wide-based gait.  Romberg with sway.    Thank you for allowing me to take part in the care of this patient.  Amy Dunnings, DO  CC:  Vyvyan Sun, MD

## 2023-10-11 ENCOUNTER — Ambulatory Visit: Admitting: Neurology

## 2023-10-11 ENCOUNTER — Encounter: Payer: Self-pay | Admitting: Neurology

## 2023-10-11 ENCOUNTER — Telehealth: Payer: Self-pay

## 2023-10-11 ENCOUNTER — Other Ambulatory Visit (HOSPITAL_COMMUNITY): Payer: Self-pay

## 2023-10-11 VITALS — BP 124/67 | HR 66 | Ht 61.0 in | Wt 164.0 lb

## 2023-10-11 DIAGNOSIS — R930 Abnormal findings on diagnostic imaging of skull and head, not elsewhere classified: Secondary | ICD-10-CM | POA: Diagnosis not present

## 2023-10-11 DIAGNOSIS — M542 Cervicalgia: Secondary | ICD-10-CM

## 2023-10-11 DIAGNOSIS — Z8679 Personal history of other diseases of the circulatory system: Secondary | ICD-10-CM | POA: Diagnosis not present

## 2023-10-11 DIAGNOSIS — G43009 Migraine without aura, not intractable, without status migrainosus: Secondary | ICD-10-CM

## 2023-10-11 DIAGNOSIS — R404 Transient alteration of awareness: Secondary | ICD-10-CM

## 2023-10-11 MED ORDER — AIMOVIG 70 MG/ML ~~LOC~~ SOAJ: 70.0000 mg | SUBCUTANEOUS | 5 refills | Status: DC

## 2023-10-11 NOTE — Telephone Encounter (Signed)
 Pharmacy Patient Advocate Encounter   Received notification from Pt Calls Messages that prior authorization for Aimovig  70MG /ML auto-injectors is required/requested.   Insurance verification completed.   The patient is insured through [facility] Medicare Part D .   Per test claim: PA required; PA submitted to above mentioned insurance via CoverMyMeds Key/confirmation #/EOC [id number] Status is pending

## 2023-10-11 NOTE — Patient Instructions (Addendum)
 Check CTA head We have sent a referral to [facility] for your MRI and they will call you directly to schedule your appointment. They are located at [address] [facility]. If you need to contact them directly please call [phone].  Check one hour EEG. An EEG has been ordered. To prepare for this procedure:   -Take prescribed medications as normal -Please have hair free of any product, oils, or any hairstyles that impede a connection to the scalp (sewn in hair, glued in hair, etc) -Please ensure that hair is fully dry  Will try to get Aimovig  injection every 30 days approved.  If no improvement in 3 months, contact me Refer to physical therapy for neck pain/cervicogenic headache and gait instability. Follow up 8 months.

## 2023-10-12 NOTE — Telephone Encounter (Signed)
 Pharmacy Patient Advocate Encounter  Received notification from [facility] Medicare Part D that Prior Authorization for  Aimovig  70MG /ML auto-injectors has been APPROVED from 10-11-2023 to 04-03-2024   PA #/Case ID/Reference #: [id number]

## 2023-10-13 ENCOUNTER — Ambulatory Visit
Admission: RE | Admit: 2023-10-13 | Discharge: 2023-10-13 | Disposition: A | Discharge status: Home / Self Care | Source: Ambulatory Visit | Attending: Neurology | Admitting: Neurology

## 2023-10-13 DIAGNOSIS — R930 Abnormal findings on diagnostic imaging of skull and head, not elsewhere classified: Secondary | ICD-10-CM

## 2023-10-13 DIAGNOSIS — Z8679 Personal history of other diseases of the circulatory system: Secondary | ICD-10-CM

## 2023-10-13 NOTE — Progress Notes (Signed)
 Pt came in today for an outpatient CT angio head.  Due to difficult IV access we were unable to obtain the required 20g IV.  Discussed with pt that she would need to be reschedule at one of the hospital locations were advanced IV access resources are available.  Pt understood and agreeable.  Schedulers and Ordering provider notified.

## 2023-10-18 ENCOUNTER — Ambulatory Visit: Admitting: Neurology

## 2023-10-18 DIAGNOSIS — R404 Transient alteration of awareness: Secondary | ICD-10-CM

## 2023-10-18 DIAGNOSIS — L720 Epidermal cyst: Secondary | ICD-10-CM | POA: Diagnosis not present

## 2023-10-18 NOTE — Procedures (Signed)
 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  Medications: Current Outpatient Medications on File Prior to Visit  Medication Sig Dispense Refill   acetaminophen  (TYLENOL ) 500 MG tablet Take 500 mg by mouth every 6 (six) hours as needed.     acetaminophen -codeine  (TYLENOL  #3) 300-30 MG tablet Take 1 tablet by mouth every 6 (six) hours as needed. 50 tablet 0   amLODipine  (NORVASC ) 5 MG tablet Take 5 mg by mouth daily.     Erenumab -aooe (AIMOVIG ) 70 MG/ML SOAJ Inject 70 mg into the skin every 28 (twenty-eight) days. 1.12 mL 5   escitalopram  (LEXAPRO ) 20 MG tablet TAKE 1 TABLET BY MOUTH EVERYDAY AT BEDTIME 90 tablet 3   famotidine (PEPCID) 40 MG tablet Take 40 mg by mouth daily.     furosemide  (LASIX ) 40 MG tablet Take 40 mg by mouth daily.     levETIRAcetam  (KEPPRA ) 500 MG tablet TAKE 1 TABLET BY MOUTH TWICE A DAY 180 tablet 2   Menthol -Methyl Salicylate  (MENTHODERM) 10-15 % OINT      methocarbamol  (ROBAXIN ) 500 MG tablet Take 1 tablet (500 mg total) by mouth every 6 (six) hours as needed for muscle spasms. 120 tablet 3   methylphenidate  (CONCERTA ) 36 MG PO CR tablet Take 1 tablet (36 mg total) by mouth daily. 30 tablet 0   metoprolol  (LOPRESSOR ) 100 MG tablet TAKE ONE TABLET BY MOUTH TWICE DAILY 180 tablet 3   omeprazole  (PRILOSEC) 40 MG capsule daily.     ondansetron  (ZOFRAN ) 4 MG tablet 1 tablet     Probiotic CHEW See admin instructions.     rizatriptan  (MAXALT ) 10 MG tablet Take 1 tablet (10 mg total) by mouth daily as needed for migraine. May repeat in 2 hours if needed 30 tablet 2   saccharomyces boulardii (FLORASTOR) 250 MG capsule Take 1 capsule (250 mg total) by mouth 2 (two) times daily.     topiramate  (TOPAMAX ) 100 MG tablet TAKE 1 TABLET BY MOUTH  TWICE A DAY 180 tablet 2   traZODone  (DESYREL ) 50 MG tablet TAKE 1 TABLET BY MOUTH EVERYDAY AT BEDTIME 90 tablet 4   No current facility-administered medications on file prior to visit.    Technical Summary: A multichannel digital EEG recording measured by the international 10-20 system with electrodes applied with paste and impedances below 5000 ohms performed in our laboratory with EKG monitoring in an awake and asleep patient.  Photic stimulation was performed.  Due to history of SAH, hyperventilation was not performed.  The digital EEG was referentially recorded, reformatted, and digitally filtered in a variety of bipolar and referential montages for optimal display.    Description: The patient is awake and asleep during the recording.  During maximal wakefulness, there is a symmetric, medium voltage 10 Hz posterior dominant rhythm that attenuates with eye opening.  The record is symmetric.  During drowsiness and sleep, there is an increase in theta slowing of the background.  Stage 2 sleep was seen.  Photic stimulation demonstrated driving but no abnormalities.  During photic stimulation she did exhibit difficulty speaking.  There were no epileptiform discharges or electrographic seizures seen.    EKG lead was unremarkable.  Impression: This awake and asleep EEG is normal.    Clinical Correlation: A normal EEG does not exclude a clinical diagnosis of epilepsy.  If further clinical questions remain, prolonged EEG may be helpful.  Clinical correlation is advised.   Juliene Dunnings, DO

## 2023-10-20 ENCOUNTER — Ambulatory Visit: Attending: Neurology | Admitting: Physical Therapy

## 2023-10-20 ENCOUNTER — Ambulatory Visit: Payer: Self-pay | Admitting: Neurology

## 2023-10-20 ENCOUNTER — Encounter: Payer: Self-pay | Admitting: Physical Therapy

## 2023-10-20 VITALS — BP 114/86 | HR 58

## 2023-10-20 DIAGNOSIS — R293 Abnormal posture: Secondary | ICD-10-CM | POA: Insufficient documentation

## 2023-10-20 DIAGNOSIS — R2689 Other abnormalities of gait and mobility: Secondary | ICD-10-CM | POA: Diagnosis not present

## 2023-10-20 DIAGNOSIS — M542 Cervicalgia: Secondary | ICD-10-CM | POA: Diagnosis not present

## 2023-10-20 DIAGNOSIS — M6281 Muscle weakness (generalized): Secondary | ICD-10-CM | POA: Diagnosis not present

## 2023-10-20 DIAGNOSIS — R404 Transient alteration of awareness: Secondary | ICD-10-CM

## 2023-10-20 NOTE — Therapy (Signed)
 OUTPATIENT PHYSICAL THERAPY NEURO EVALUATION   Patient Name: PIEPER KASIK MRN: [id number] DOB:30-Jan-1971, 53 y.o., female Today's Date: 10/20/2023   PCP: Sun, Vyvyan, MD REFERRING PROVIDER: Skeet Juliene SAUNDERS, DO  END OF SESSION:  PT End of Session - 10/20/23 0808     Visit Number 1    Number of Visits 13   Plus eval   Date for PT Re-Evaluation 12/08/23    Authorization Type [employer]    Authorization Time Period Auth requested on 7/18    PT Start Time 0805    PT Stop Time 0850    PT Time Calculation (min) 45 min    Activity Tolerance Patient tolerated treatment well    Behavior During Therapy [facility] for tasks assessed/performed;Lability          Past Medical History:  Diagnosis Date   Anxiety    Arthralgia    Generalized weakness    bilateral upper and lower extremities,  walks with cane   GERD (gastroesophageal reflux disease)    Headache(784.0)    Heart murmur    per pt since childhood, no problems   History of kidney stones    Hypertension    followed pcp   IBS (irritable bowel syndrome)    Iron deficiency anemia    12-06-2017 per pt recently had IV iron infusion July 2019   Low vitamin D level    Osteoarthritis    pt unsure about this   PVC (premature ventricular contraction)    per pt had holter monitor   SLE (systemic lupus erythematosus) (HCC)    rheumotologist-- dr syed   Stroke [facility]) 06/2020   Wears glasses    Past Surgical History:  Procedure Laterality Date   CERVICAL DISC ARTHROPLASTY N/A 10/23/2015   Procedure: Cervical Disc Arthroplasty Cervical six-seven;  Surgeon: Alm GORMAN Molt, MD;  Location: [facility] NEURO ORS;  Service: Neurosurgery;  Laterality: N/A;   D & C HYSTEROSCOPY W/ RESECTION ENDOMETRIAL POLYP  10-13-2003    dr rivard @[facility]    ESOPHAGOGASTRODUODENOSCOPY     for GERD   IR ANGIO INTRA EXTRACRAN SEL COM CAROTID INNOMINATE BILAT MOD SED  07/06/2020   IR ANGIO INTRA EXTRACRAN SEL INTERNAL CAROTID BILAT MOD SED  06/29/2020   IR ANGIO VERTEBRAL SEL  VERTEBRAL BILAT MOD SED  06/29/2020   IR ANGIO VERTEBRAL SEL VERTEBRAL BILAT MOD SED  07/06/2020   LAPAROSCOPIC ABDOMINAL EXPLORATION  1996   dx ibs   OVARIAN CYST SURGERY  age 31   x2 cyst   RADIOLOGY WITH ANESTHESIA N/A 06/29/2020   Procedure: IR WITH ANESTHESIA;  Surgeon: Radiologist, Medication, MD;  Location: [facility] OR;  Service: Radiology;  Laterality: N/A;   ROBOTIC ASSISTED TOTAL HYSTERECTOMY N/A 12/13/2017   Procedure: XI ROBOTIC ASSISTED TOTAL HYSTERECTOMYWITH BILATERAL SALPINGECTOMY;  Surgeon: Rosalva Sawyer, MD;  Location: [facility] ORS;  Service: Gynecology;  Laterality: N/A;   TUBAL LIGATION Bilateral 06-01-2002   dr henry @[facility]    PPTL   Patient Active Problem List   Diagnosis Date Noted   Migraine 04/19/2023   Generalized anxiety disorder 05/05/2021   Biceps tendonitis on left 02/10/2021   Vascular headache 08/05/2020   Hyponatremia 07/28/2020   Headache 07/24/2020   Neck pain, musculoskeletal 07/24/2020   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    Subarachnoid bleed (HCC) 07/09/2020   Subarachnoid hemorrhage (HCC) 06/29/2020   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HCC) 03/04/2019   S/P hysterectomy 12/13/2017   Abnormal laboratory test 09/09/2016   Gait abnormality 08/03/2016  Chronic fatigue 08/03/2016   Paresthesia 08/03/2016   S/P cervical spinal fusion 10/23/2015   Hypersomnia 12/08/2010   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   EPIGASTRIC PAIN 10/13/2009   IRRITABLE BOWEL SYNDROME 03/10/2009   BACK PAIN, LUMBAR 07/01/2008   COSTOCHONDRITIS 02/01/2008   VIRAL URI 01/08/2008   CHEST PAIN 01/08/2008   OTHER SPECIFIED EPISODIC MOOD DISORDER 12/25/2007   ARM PAIN 12/25/2007   ANEMIA-NOS 10/02/2007   Essential hypertension 10/02/2007   GERD 10/02/2007   POLYP, GALLBLADDER 10/02/2007   UTERINE POLYP 10/02/2007   OSTEOARTHRITIS 10/02/2007   Dyskinesia 10/02/2007   HEADACHE 10/02/2007   PALPITATIONS 10/02/2007   CARDIAC MURMUR, HX OF 10/02/2007   NEPHROLITHIASIS, HX OF  10/02/2007    ONSET DATE: 10/11/2023 (referral)   REFERRING DIAG: M54.2 (ICD-10-CM) - Neck pain  THERAPY DIAG:  Muscle weakness (generalized)  Cervicalgia  Other abnormalities of gait and mobility  Abnormal posture  Rationale for Evaluation and Treatment: Rehabilitation  SUBJECTIVE:                                                                                                                                                                                             SUBJECTIVE STATEMENT: Pt presents without AD. Having difficulty speaking this morning due to anxiety and tears, so first part of eval spent w/husband, Darin, calming pt down. Pt reports she was triggered by walking into clinic as she has been here several times. After some tears and encouragement, pt able to speak. Pt reports her head and neck hurts all the time, has worsened over the past 41mo. Pain will start on the L side of her head and radiate down her RUE.  Was given a new prescription to try, but has not tried yet- is planning to this weekend. Pain gets worse w/catatonic episodes and she often wakes up with pain. Went to have CTA of head/neck last week but they could not find a vein, so she had to reschedule it. Is bothered by the fact that she has an accent now- never did before.    Pt accompanied by: Wilburt Darin   PERTINENT HISTORY: anxiety, GERD, headache, HTN, SLE, CVA and aneurysm with resultant dysarthria (2022),  PAIN:  Are you having pain? Yes: NPRS scale: did not rate  Pain location: Head, neck, RUE Pain description: Achy, sharp, throbbing  Aggravating factors: Catatonic episodes, anxiety  Relieving factors: None  PRECAUTIONS: Fall  RED FLAGS: None   WEIGHT BEARING RESTRICTIONS: No  FALLS: Has patient fallen in last 6 months? No   PLOF: Needs assistance with ADLs, Needs assistance with homemaking, Needs assistance with gait, and Needs assistance with  transfers  PATIENT GOALS: Unable to  assess on eval due to emotions and aphasia   OBJECTIVE:  Note: Objective measures were completed at Evaluation unless otherwise noted.  DIAGNOSTIC FINDINGS: From Dr. Jayme note on 10/11/23:  10/27/2011 MRI CERVICAL SPINE WO:  Tiny central disc bulges from C2-3 through C4-5 with no significant neural impingement. 06/19/2012 CTA HEAD:  Negative 09/12/2015 MRI CERVICAL SPINE WO:  Progressed cervical spondylosis and degenerative disc disease with left foraminal disc protrusion and nerve impingement at C6-7, moderate impingement at C3-4 and C4-5 and mild impingement at C5-6. 08/03/2016 MRI BRAIN WO:  Unremarkable 08/03/2016 MRI CERVICAL SPINE WO:  Status post arthrodesis at C6-7 with questionable posterior migration of C6-7 implant, mild to moderate canal stenosis with mild cord flattening without definite cord signal changes; moderate degenerative spondylosis with disc protrusions at C3-4, C4-5, and C5-6. 08/04/2016 MRI CERVICAL SPINE WO:  C6-7 arthrodesis now appears in place. 10/06/2016 MRI LUMBAR SPINE WO:  Degenerative disc disease and mild bilateral foraminal narrowing at L5-S1 without significant compression; mild ligamentum flavum hypertrophy and facet degenerative facet changes at L3-4 and L4-5 without compression. 03/04/2019 X-RAY BILATERAL HIP/PELVIS:  Mild degenerative changes both hips.  Otherwise, unremarkable. 05/18/2019 MRI BRAIN W WO:  Normal exam.  No change since 2018. 05/18/2019 MRI C-SPINE W WO:  C3-4: Chronic central disc herniation effaces the ventral subarachnoid space and indents the ventral cord. AP diameter of the canal in the midline only 6.5 mm. Similar appearance to the study of 2018, possibly with minimal enlargement of the disc. C4-5: Chronic central disc herniation effaces the ventral subarachnoid space and indents the ventral cord. AP diameter of the canal in the midline is 8 mm. Similar appearance to the study of 2018, possibly with minimal enlargement of the disc.   C5-6: Mild noncompressive disc bulge.  C6-7: Previous disc arthroplasty. Extensive artifact precludes MR evaluation of the canal or foramina at this level. 06/21/2019 MRI T-SPINE W WO:  Normal thoracic spine MRI. No finding to explain the patient's symptoms. 06/21/2019 MRI L-SPINE W WO:  Mild degenerative disc disease L3-4 and L5-S1 without central canal or foraminal stenosis. The examination is otherwise negative. No finding to explain the patient's symptoms. 06/29/2020 CT HEAD:  Subarachnoid hemorrhage in the posterior fossa with aneurysmal pattern. There is intraventricular reflux and ventriculomegaly. 06/29/2020 CTA HEAD &  NECK:  No definite source of subarachnoid hemorrhage but there poor visualization of the proximal left PICA with a tiny bulbous area, question ruptured dissection. 07/06/2020 CEREBRAL ANGIOGRAM:  1. No intracranial aneurysms, arteriovenous malformations, or  fistulas are identified. 2. Mild, nonflow limiting vasospasm involving the supraclinoid  segment of the internal carotid arteries bilaterally.  11/25/2020 MRA HEAD WO:  1. Question 2 mm saccular outpouching extending medially and inferiorly from the paraophthalmic left ICA. Finding is indeterminate, but could reflect a small paraophthalmic aneurysm. This is not definitely seen on previous exams. 2. Otherwise normal intracranial MRA. 08/27/2023 MRI BRAIN WO:  1. No acute intracranial abnormality. 2. 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  COGNITION: Overall cognitive status: Impaired   SENSATION: Not tested    POSTURE: rounded shoulders, forward head, and increased thoracic kyphosis   LOWER EXTREMITY MMT:    MMT Right Eval Left Eval  Hip flexion    Hip extension    Hip abduction    Hip adduction    Hip internal rotation    Hip external rotation    Knee flexion  Knee extension    Ankle dorsiflexion    Ankle plantarflexion     Ankle inversion    Ankle eversion    (Blank rows = not tested)  CERVICAL ROM: tested in seated position  Active ROM A/PROM (deg) eval  Flexion 31  Extension 30  Right lateral flexion 34  Left lateral flexion 47  Right rotation 67  Left rotation 40   (Blank rows = not tested)   PALPATION: TTP along R upper trap, R suboccipitals, R rhomboids and bilateral semispinalis capitis   TRANSFERS: Sit to stand: Modified independence  Assistive device utilized: None     Stand to sit: Modified independence  Assistive device utilized: None      RAMP:  Not tested  CURB:  Not tested  STAIRS: Not tested  GAIT: Gait pattern: WFL Distance walked: Various clinic distances  Assistive device utilized: None Level of assistance: Modified independence Comments: No instability noted    PATIENT SURVEYS:  NDI: to be assessed    VITALS  Vitals:   10/20/23 0833 10/20/23 0836  BP: (!) 142/94 114/86  Pulse: (!) 59 (!) 58                                                                                                                                 TREATMENT:   Self-care/home management  Assessed vitals (see above) and BP initially elevated due to anxiety but did reduce w/seated rest and diaphragmatic breathing  Discussed PT POC and educated pt on TPDN, which pt in agreement to try  Inquired about a cardiologist, as pt has been keeping a log of her BP and reports it is not normal. Pt reports she has been wanting to see a cardiologist but has not been referred, so will mention this to her PCP or neurologist at next appointment    PATIENT EDUCATION: Education details: POC, eval findings, see self-care section  Person educated: Patient and Spouse Education method: Explanation Education comprehension: verbalized understanding  HOME EXERCISE PROGRAM: To be established   GOALS: Goals reviewed with patient? Yes  SHORT TERM GOALS: Target date: 11/17/2023   Pt will be independent with  initial HEP for improved strength and cervical ROM.  Baseline: not established on eval  Goal status: INITIAL  2.  NDI to be assessed and LTG updated  Baseline:  Goal status: INITIAL  3.  Pt will improve cervical flexion A/ROM to >/= 45 degrees for improved functional use of C-spine and reduced pain  Baseline:  Active ROM A/PROM (deg) eval  Flexion 31  Extension 30  Right lateral flexion 34  Left lateral flexion 47  Right rotation 67  Left rotation 40   (Blank rows = not tested)  Goal status: INITIAL  4.  Pt will improved L cervical rotation A/ROM to >/= 50 degrees for reduced pain and improved quality of life  Baseline:  Active ROM A/PROM (deg) eval  Flexion 31  Extension 30  Right lateral flexion 34  Left lateral flexion 47  Right rotation 67  Left rotation 40   (Blank rows = not tested)  Goal status: INITIAL   LONG TERM GOALS: Target date: 12/01/2023  NDI goal  Baseline:  Goal status: INITIAL  2.  Pt will improved R lateral flexion cervical A/ROM to >/= 45 degrees for improved functional use of C-spine and reduced pain  Baseline:  Active ROM A/PROM (deg) eval  Flexion 31  Extension 30  Right lateral flexion 34  Left lateral flexion 47  Right rotation 67  Left rotation 40   (Blank rows = not tested)  Goal status: INITIAL  3.  Pt will be independent with final HEP for improved strength and cervical ROM.  Baseline:  Goal status: INITIAL   ASSESSMENT:  CLINICAL IMPRESSION: Evaluation limited as pt very emotional upon entering clinic and could not speak for >10 minutes. Patient is a 53 year old female referred to Neuro OPPT for neck pain. Pt's PMH is significant for: anxiety, GERD, headache, HTN, SLE, CVA and aneurysm with resultant dysarthria (2022).  The following deficits were present during the exam: decreased cervical A/ROM, decreased strength, dizziness, emotional lability, expressive aphasia and postural deficits. Pt not using AD for eval and no  balance deficits noted today. Pt would benefit from skilled PT to address these impairments and functional limitations to maximize functional mobility independence.    OBJECTIVE IMPAIRMENTS: decreased cognition, decreased knowledge of condition, decreased mobility, decreased ROM, decreased strength, impaired perceived functional ability, impaired flexibility, impaired UE functional use, improper body mechanics, postural dysfunction, and pain  ACTIVITY LIMITATIONS: carrying, lifting, sleeping, transfers, reach over head, locomotion level, and caring for others  PARTICIPATION LIMITATIONS: meal prep, cleaning, laundry, medication management, interpersonal relationship, driving, shopping, community activity, occupation, and yard work  PERSONAL FACTORS: Behavior pattern, Past/current experiences, Time since onset of injury/illness/exacerbation, and 1-2 comorbidities: hx of SAH w/unknown etiology, expressive aphasia are also affecting patient's functional outcome.   REHAB POTENTIAL: Good  CLINICAL DECISION MAKING: Unstable/unpredictable  EVALUATION COMPLEXITY: High  PLAN:  PT FREQUENCY: 2x/week  PT DURATION: 6 weeks  PLANNED INTERVENTIONS: 97164- PT Re-evaluation, 97750- Physical Performance Testing, 97110-Therapeutic exercises, 97530- Therapeutic activity, [id number]- Neuromuscular re-education, 97535- Self Care, [id number]- Manual therapy, [id number]- Gait training, [phone]- Aquatic Therapy, [phone] (1-2 muscles), 20561 (3+ muscles)- Dry Needling, Patient/Family education, Balance training, Joint mobilization, Spinal mobilization, and Vestibular training  PLAN FOR NEXT SESSION: NDI and update goal. TPDN to R upper traps, suboccipitals, semispinalis. Establish HEP for gentle upper trap stretch and cervical ROM. Watch vitals    Giuseppe Duchemin E Noheli Melder, PT, DPT 10/20/2023, 10:35 AM

## 2023-10-23 NOTE — Telephone Encounter (Signed)
 Patient advised.

## 2023-10-23 NOTE — Telephone Encounter (Signed)
-----   Message from Juliene Lamar Dunnings sent at 10/20/2023  3:56 PM EDT ----- EEG is normal.  I would like to order a 72 hour ambulatory EEG in order to capture one of her spells.   ----- Message ----- From: Dunnings Juliene SAUNDERS, DO Sent: 10/18/2023   4:18 PM EDT To: Juliene SAUNDERS Dunnings, DO

## 2023-10-25 ENCOUNTER — Telehealth: Payer: Self-pay | Admitting: *Deleted

## 2023-10-26 ENCOUNTER — Ambulatory Visit: Admitting: Physical Therapy

## 2023-10-26 DIAGNOSIS — R2689 Other abnormalities of gait and mobility: Secondary | ICD-10-CM | POA: Diagnosis not present

## 2023-10-26 DIAGNOSIS — M6281 Muscle weakness (generalized): Secondary | ICD-10-CM | POA: Diagnosis not present

## 2023-10-26 DIAGNOSIS — M542 Cervicalgia: Secondary | ICD-10-CM

## 2023-10-26 DIAGNOSIS — R293 Abnormal posture: Secondary | ICD-10-CM | POA: Diagnosis not present

## 2023-10-26 NOTE — Therapy (Signed)
 OUTPATIENT PHYSICAL THERAPY NEURO TREATMENT   Patient Name: Amy Barry MRN: [id number] DOB:05/17/1970, 53 y.o., female Today's Date: 10/26/2023   PCP: Sun, Vyvyan, MD REFERRING PROVIDER: Skeet Juliene SAUNDERS, DO  END OF SESSION:  PT End of Session - 10/26/23 0805     Visit Number 2    Number of Visits 13   Plus eval   Date for PT Re-Evaluation 12/08/23    Authorization Type [employer]    Authorization Time Period Auth requested on 7/18    PT Start Time 0803   pt arrived late   PT Stop Time 0844    PT Time Calculation (min) 41 min    Activity Tolerance Patient tolerated treatment well    Behavior During Therapy [facility] for tasks assessed/performed;Lability           Past Medical History:  Diagnosis Date   Anxiety    Arthralgia    Generalized weakness    bilateral upper and lower extremities,  walks with cane   GERD (gastroesophageal reflux disease)    Headache(784.0)    Heart murmur    per pt since childhood, no problems   History of kidney stones    Hypertension    followed pcp   IBS (irritable bowel syndrome)    Iron deficiency anemia    12-06-2017 per pt recently had IV iron infusion July 2019   Low vitamin D level    Osteoarthritis    pt unsure about this   PVC (premature ventricular contraction)    per pt had holter monitor   SLE (systemic lupus erythematosus) (HCC)    rheumotologist-- dr syed   Stroke [facility]) 06/2020   Wears glasses    Past Surgical History:  Procedure Laterality Date   CERVICAL DISC ARTHROPLASTY N/A 10/23/2015   Procedure: Cervical Disc Arthroplasty Cervical six-seven;  Surgeon: Alm GORMAN Molt, MD;  Location: [facility] NEURO ORS;  Service: Neurosurgery;  Laterality: N/A;   D & C HYSTEROSCOPY W/ RESECTION ENDOMETRIAL POLYP  10-13-2003    dr rivard @[facility]    ESOPHAGOGASTRODUODENOSCOPY     for GERD   IR ANGIO INTRA EXTRACRAN SEL COM CAROTID INNOMINATE BILAT MOD SED  07/06/2020   IR ANGIO INTRA EXTRACRAN SEL INTERNAL CAROTID BILAT MOD SED  06/29/2020   IR ANGIO  VERTEBRAL SEL VERTEBRAL BILAT MOD SED  06/29/2020   IR ANGIO VERTEBRAL SEL VERTEBRAL BILAT MOD SED  07/06/2020   LAPAROSCOPIC ABDOMINAL EXPLORATION  1996   dx ibs   OVARIAN CYST SURGERY  age 53   x2 cyst   RADIOLOGY WITH ANESTHESIA N/A 06/29/2020   Procedure: IR WITH ANESTHESIA;  Surgeon: Radiologist, Medication, MD;  Location: [facility] OR;  Service: Radiology;  Laterality: N/A;   ROBOTIC ASSISTED TOTAL HYSTERECTOMY N/A 12/13/2017   Procedure: XI ROBOTIC ASSISTED TOTAL HYSTERECTOMYWITH BILATERAL SALPINGECTOMY;  Surgeon: Rosalva Sawyer, MD;  Location: [facility] ORS;  Service: Gynecology;  Laterality: N/A;   TUBAL LIGATION Bilateral 06-01-2002   dr henry @[facility]    PPTL   Patient Active Problem List   Diagnosis Date Noted   Migraine 04/19/2023   Generalized anxiety disorder 05/05/2021   Biceps tendonitis on left 02/10/2021   Vascular headache 08/05/2020   Hyponatremia 07/28/2020   Headache 07/24/2020   Neck pain, musculoskeletal 07/24/2020   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    Subarachnoid bleed (HCC) 07/09/2020   Subarachnoid hemorrhage (HCC) 06/29/2020   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HCC) 03/04/2019   S/P hysterectomy 12/13/2017   Abnormal laboratory test 09/09/2016  Gait abnormality 08/03/2016   Chronic fatigue 08/03/2016   Paresthesia 08/03/2016   S/P cervical spinal fusion 10/23/2015   Hypersomnia 12/08/2010   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   EPIGASTRIC PAIN 10/13/2009   IRRITABLE BOWEL SYNDROME 03/10/2009   BACK PAIN, LUMBAR 07/01/2008   COSTOCHONDRITIS 02/01/2008   VIRAL URI 01/08/2008   CHEST PAIN 01/08/2008   OTHER SPECIFIED EPISODIC MOOD DISORDER 12/25/2007   ARM PAIN 12/25/2007   ANEMIA-NOS 10/02/2007   Essential hypertension 10/02/2007   GERD 10/02/2007   POLYP, GALLBLADDER 10/02/2007   UTERINE POLYP 10/02/2007   OSTEOARTHRITIS 10/02/2007   Dyskinesia 10/02/2007   HEADACHE 10/02/2007   PALPITATIONS 10/02/2007   CARDIAC MURMUR, HX OF 10/02/2007   NEPHROLITHIASIS,  HX OF 10/02/2007    ONSET DATE: 10/11/2023 (referral)   REFERRING DIAG: M54.2 (ICD-10-CM) - Neck pain  THERAPY DIAG:  Muscle weakness (generalized)  Cervicalgia  Abnormal posture  Rationale for Evaluation and Treatment: Rehabilitation  SUBJECTIVE:                                                                                                                                                                                             SUBJECTIVE STATEMENT:  Pt reports that her headaches are doing better today, she is having pain in her R upper shoulder area and down into her arm.  Pt accompanied by: [name]   PERTINENT HISTORY: anxiety, GERD, headache, HTN, SLE, CVA and aneurysm with resultant dysarthria (2022),  PAIN:  Are you having pain? Yes: NPRS scale: 4-5/10  Pain location: Head, neck, RUE Pain description: Achy, sharp, throbbing  Aggravating factors: Catatonic episodes, anxiety  Relieving factors: None  PRECAUTIONS: Fall  RED FLAGS: None   WEIGHT BEARING RESTRICTIONS: No  FALLS: Has patient fallen in last 6 months? No   PLOF: Needs assistance with ADLs, Needs assistance with homemaking, Needs assistance with gait, and Needs assistance with transfers  PATIENT GOALS: Unable to assess on eval due to emotions and aphasia   OBJECTIVE:  Note: Objective measures were completed at Evaluation unless otherwise noted.  DIAGNOSTIC FINDINGS: From Dr. Jayme note on 10/11/23:  10/27/2011 MRI CERVICAL SPINE WO:  Tiny central disc bulges from C2-3 through C4-5 with no significant neural impingement. 06/19/2012 CTA HEAD:  Negative 09/12/2015 MRI CERVICAL SPINE WO:  Progressed cervical spondylosis and degenerative disc disease with left foraminal disc protrusion and nerve impingement at C6-7, moderate impingement at C3-4 and C4-5 and mild impingement at C5-6. 08/03/2016 MRI BRAIN WO:  Unremarkable 08/03/2016 MRI CERVICAL SPINE WO:  Status post arthrodesis at C6-7 with  questionable posterior migration of C6-7 implant, mild to moderate canal stenosis with  mild cord flattening without definite cord signal changes; moderate degenerative spondylosis with disc protrusions at C3-4, C4-5, and C5-6. 08/04/2016 MRI CERVICAL SPINE WO:  C6-7 arthrodesis now appears in place. 10/06/2016 MRI LUMBAR SPINE WO:  Degenerative disc disease and mild bilateral foraminal narrowing at L5-S1 without significant compression; mild ligamentum flavum hypertrophy and facet degenerative facet changes at L3-4 and L4-5 without compression. 03/04/2019 X-RAY BILATERAL HIP/PELVIS:  Mild degenerative changes both hips.  Otherwise, unremarkable. 05/18/2019 MRI BRAIN W WO:  Normal exam.  No change since 2018. 05/18/2019 MRI C-SPINE W WO:  C3-4: Chronic central disc herniation effaces the ventral subarachnoid space and indents the ventral cord. AP diameter of the canal in the midline only 6.5 mm. Similar appearance to the study of 2018, possibly with minimal enlargement of the disc. C4-5: Chronic central disc herniation effaces the ventral subarachnoid space and indents the ventral cord. AP diameter of the canal in the midline is 8 mm. Similar appearance to the study of 2018, possibly with minimal enlargement of the disc.  C5-6: Mild noncompressive disc bulge.  C6-7: Previous disc arthroplasty. Extensive artifact precludes MR evaluation of the canal or foramina at this level. 06/21/2019 MRI T-SPINE W WO:  Normal thoracic spine MRI. No finding to explain the patient's symptoms. 06/21/2019 MRI L-SPINE W WO:  Mild degenerative disc disease L3-4 and L5-S1 without central canal or foraminal stenosis. The examination is otherwise negative. No finding to explain the patient's symptoms. 06/29/2020 CT HEAD:  Subarachnoid hemorrhage in the posterior fossa with aneurysmal pattern. There is intraventricular reflux and ventriculomegaly. 06/29/2020 CTA HEAD &  NECK:  No definite source of subarachnoid hemorrhage but there  poor visualization of the proximal left PICA with a tiny bulbous area, question ruptured dissection. 07/06/2020 CEREBRAL ANGIOGRAM:  1. No intracranial aneurysms, arteriovenous malformations, or  fistulas are identified. 2. Mild, nonflow limiting vasospasm involving the supraclinoid  segment of the internal carotid arteries bilaterally.  11/25/2020 MRA HEAD WO:  1. Question 2 mm saccular outpouching extending medially and inferiorly from the paraophthalmic left ICA. Finding is indeterminate, but could reflect a small paraophthalmic aneurysm. This is not definitely seen on previous exams. 2. Otherwise normal intracranial MRA. 08/27/2023 MRI BRAIN WO:  1. No acute intracranial abnormality. 2. 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  COGNITION: Overall cognitive status: Impaired   SENSATION: Not tested    POSTURE: rounded shoulders, forward head, and increased thoracic kyphosis   LOWER EXTREMITY MMT:    MMT Right Eval Left Eval  Hip flexion    Hip extension    Hip abduction    Hip adduction    Hip internal rotation    Hip external rotation    Knee flexion    Knee extension    Ankle dorsiflexion    Ankle plantarflexion    Ankle inversion    Ankle eversion    (Blank rows = not tested)  CERVICAL ROM: tested in seated position  Active ROM A/PROM (deg) eval  Flexion 31  Extension 30  Right lateral flexion 34  Left lateral flexion 47  Right rotation 67  Left rotation 40   (Blank rows = not tested)   PALPATION: TTP along R upper trap, R suboccipitals, R rhomboids and bilateral semispinalis capitis   TRANSFERS: Sit to stand: Modified independence  Assistive device utilized: None     Stand to sit: Modified independence  Assistive device utilized: None      RAMP:  Not tested  CURB:  Not tested  STAIRS: Not tested  GAIT: Gait pattern: WFL Distance walked: Various clinic distances   Assistive device utilized: None Level of assistance: Modified independence Comments: No instability noted    PATIENT SURVEYS:  NDI: to be assessed    VITALS  There were no vitals filed for this visit.                                                                                                                                TREATMENT:    TherEx To address cervical and shoulder muscle tightness following TPDN: Seated UT stretch 3 x 30 sec each B Seated levator scap stretch 3 sec 30 sec each B  Added to HEP, see bolded below  TherAct NDI: 16/45 (modified as pt does not drive), moderate disability   Trigger Point Dry Needling  Initial Treatment: Pt instructed on Dry Needling rational, procedures, and possible side effects. Pt instructed to expect mild to moderate muscle soreness later in the day and/or into the next day.  Pt instructed in methods to reduce muscle soreness. Pt instructed to continue prescribed HEP. Because Dry Needling was performed over or adjacent to a lung field, pt was educated on S/S of pneumothorax and to seek immediate medical attention should they occur.  Patient was educated on signs and symptoms of infection and other risk factors and advised to seek medical attention should they occur.  Patient verbalized understanding of these instructions and education.   Patient Verbal Consent Given: Yes Education Handout Provided: Yes Muscles Treated: L and R suboccipitals, L and R cervical paraspinals, L and R UT Electrical Stimulation Performed: No Treatment Response/Outcome: deep ache/pressure; muscle twitch detected   Trigger Point Dry Needling  What is Trigger Point Dry Needling (DN)? DN is a physical therapy technique used to treat muscle pain and dysfunction. Specifically, DN helps deactivate muscle trigger points (muscle knots).  A thin filiform needle is used to penetrate the skin and stimulate the underlying trigger point. The goal is for a  local twitch response (LTR) to occur and for the trigger point to relax. No medication of any kind is injected during the procedure.  Benefits of Trigger Point Dry Needling Reduces localized tension and stiffness promoting muscle function. Promotes range of motion and flexibility of the area being treated. Relieves localized and referred muscle related pain.  Promotes localized blood flow. Benefits of DN increase when performed with formal therapy including strengthening and stretching.   What Does Trigger Point Dry Needling Feel Like?  The procedure feels different for each individual patient. Some patients report that they do not actually feel the needle enter the skin and overall the process is not painful. Very mild bleeding may occur. However, many patients feel a deep cramping in the muscle in which the needle was inserted. This is the local twitch response.   How Will I feel after the treatment? Soreness is normal, and the onset  of soreness may not occur for a few hours. Typically this soreness does not last longer than two days.  Bruising is uncommon, however; ice can be used to decrease any possible bruising.  In rare cases feeling tired or nauseous after the treatment is normal. In addition, your symptoms may get worse before they get better, this period will typically not last longer than 24 hours.   What Can I do After My Treatment? Increase your hydration by drinking more water for the next 24 hours.  You may place ice or heat on the areas treated that have become sore, however, do not use heat on inflamed or bruised areas. Heat often brings more relief post needling. You can continue your regular activities, but vigorous activity is not recommended initially after the treatment for 24 hours. DN is best combined with other physical therapy such as strengthening, stretching, and other therapies.   What are the complications? While your therapist has had extensive training in  minimizing the risks of trigger point dry needling, it is important to understand the risks of any procedure.  Risks include bleeding, pain, fatigue, hematoma, infection, vertigo, nausea or nerve involvement. Monitor for any changes to your skin or sensation. Contact your therapist or MD with concerns.  A rare but serious complication is a pneumothorax over or near your middle and upper chest and back If you have dry needling in this area, monitor for the following symptoms: Shortness of breath on exertion and/or Difficulty taking a deep breath and/or Chest Pain and/or A dry cough If any of the above symptoms develop, please go to the nearest emergency room or call 911. Tell them you had dry needling over your thorax and report any symptoms you are having. Please follow-up with your treating therapist after you complete the medical evaluation.    PATIENT EDUCATION: Education details: TPDN (see above), initial HEP Person educated: Patient Education method: Explanation, Demonstration, and Handouts Education comprehension: verbalized understanding, returned demonstration, and needs further education  HOME EXERCISE PROGRAM: Access Code: [email or web address] URL: [email or web address] Date: 10/26/2023 Prepared by: Waddell Southgate  Exercises - Seated Upper Trapezius Stretch  - 1 x daily - 7 x weekly - 1 sets - 3-5 reps - 30 sec hold - Gentle Levator Scapulae Stretch  - 1 x daily - 7 x weekly - 1 sets - 3-5 reps - 30 sec hold  GOALS: Goals reviewed with patient? Yes  SHORT TERM GOALS: Target date: 11/17/2023   Pt will be independent with initial HEP for improved strength and cervical ROM.  Baseline: not established on eval  Goal status: INITIAL  2.  NDI to be assessed and LTG updated  Baseline: 16/45 (7/24) Goal status: MET  3.  Pt will improve cervical flexion A/ROM to >/= 45 degrees for improved functional use of C-spine and reduced pain  Baseline:  Active ROM A/PROM  (deg) eval  Flexion 31  Extension 30  Right lateral flexion 34  Left lateral flexion 47  Right rotation 67  Left rotation 40   (Blank rows = not tested)  Goal status: INITIAL  4.  Pt will improved L cervical rotation A/ROM to >/= 50 degrees for reduced pain and improved quality of life  Baseline:  Active ROM A/PROM (deg) eval  Flexion 31  Extension 30  Right lateral flexion 34  Left lateral flexion 47  Right rotation 67  Left rotation 40   (Blank rows = not tested)  Goal status: INITIAL   LONG TERM GOALS: Target  date: 12/01/2023  Pt will improve her score on the NDI to </= 11/45 to demonstrate improved function and decreased disability level Baseline: 16/45 (7/24), moderate disability Goal status: INITIAL  2.  Pt will improved R lateral flexion cervical A/ROM to >/= 45 degrees for improved functional use of C-spine and reduced pain  Baseline:  Active ROM A/PROM (deg) eval  Flexion 31  Extension 30  Right lateral flexion 34  Left lateral flexion 47  Right rotation 67  Left rotation 40   (Blank rows = not tested)  Goal status: INITIAL  3.  Pt will be independent with final HEP for improved strength and cervical ROM.  Baseline:  Goal status: INITIAL   ASSESSMENT:  CLINICAL IMPRESSION: Emphasis of skilled PT session on assessing NDI and setting LTG for improvement in function and management of pain symptoms, initiating TPDN to address trigger points in cervical and upper shoulder region, and creating initial HEP. Pt is moderately disabled based on her score on the NDI, modified test score as she does not drive. Pt with good response to DN this visit with muscle twitch detected, can further assess response next visit. Added stretches to her HEP to address muscles that were needled this date. She continues to benefit from skilled PT services to work towards improved function and decreased disability level. Continue POC.    OBJECTIVE IMPAIRMENTS: decreased cognition,  decreased knowledge of condition, decreased mobility, decreased ROM, decreased strength, impaired perceived functional ability, impaired flexibility, impaired UE functional use, improper body mechanics, postural dysfunction, and pain  ACTIVITY LIMITATIONS: carrying, lifting, sleeping, transfers, reach over head, locomotion level, and caring for others  PARTICIPATION LIMITATIONS: meal prep, cleaning, laundry, medication management, interpersonal relationship, driving, shopping, community activity, occupation, and yard work  PERSONAL FACTORS: Behavior pattern, Past/current experiences, Time since onset of injury/illness/exacerbation, and 1-2 comorbidities: hx of SAH w/unknown etiology, expressive aphasia are also affecting patient's functional outcome.   REHAB POTENTIAL: Good  CLINICAL DECISION MAKING: Unstable/unpredictable  EVALUATION COMPLEXITY: High  PLAN:  PT FREQUENCY: 2x/week  PT DURATION: 6 weeks  PLANNED INTERVENTIONS: 02835- PT Re-evaluation, 97750- Physical Performance Testing, 97110-Therapeutic exercises, 97530- Therapeutic activity, [id number]- Neuromuscular re-education, 97535- Self Care, 02859- Manual therapy, [id number]- Gait training, [phone]- Aquatic Therapy, [phone] (1-2 muscles), [id number] (3+ muscles)- Dry Needling, Patient/Family education, Balance training, Joint mobilization, Spinal mobilization, and Vestibular training  PLAN FOR NEXT SESSION: response to DN? TPDN to R upper traps, suboccipitals, semispinalis. Establish HEP for gentle upper trap stretch and cervical ROM. Suboccipital release with tennis balls? Watch vitals    Waddell Southgate, PT Waddell Southgate, PT, DPT, CSRS  10/26/2023, 8:45 AM

## 2023-11-02 ENCOUNTER — Ambulatory Visit: Admitting: Physical Therapy

## 2023-11-02 DIAGNOSIS — R293 Abnormal posture: Secondary | ICD-10-CM | POA: Diagnosis not present

## 2023-11-02 DIAGNOSIS — R2689 Other abnormalities of gait and mobility: Secondary | ICD-10-CM | POA: Diagnosis not present

## 2023-11-02 DIAGNOSIS — M542 Cervicalgia: Secondary | ICD-10-CM | POA: Diagnosis not present

## 2023-11-02 DIAGNOSIS — M6281 Muscle weakness (generalized): Secondary | ICD-10-CM

## 2023-11-02 DIAGNOSIS — I10 Essential (primary) hypertension: Secondary | ICD-10-CM | POA: Diagnosis not present

## 2023-11-02 DIAGNOSIS — I609 Nontraumatic subarachnoid hemorrhage, unspecified: Secondary | ICD-10-CM | POA: Diagnosis not present

## 2023-11-02 NOTE — Therapy (Signed)
 OUTPATIENT PHYSICAL THERAPY NEURO TREATMENT   Patient Name: Amy Barry MRN: [id number] DOB:Jan 30, 1971, 53 y.o., female Today's Date: 11/02/2023   PCP: Barry, Vyvyan, MD REFERRING PROVIDER: Skeet Juliene SAUNDERS, DO  END OF SESSION:  PT End of Session - 11/02/23 0938     Visit Number 3    Number of Visits 13   Plus eval   Date for PT Re-Evaluation 12/08/23    Authorization Type [employer]    Authorization Time Period Auth requested on 7/18    PT Start Time 0937   pt arrived late   PT Stop Time 1012    PT Time Calculation (min) 35 min    Activity Tolerance Patient tolerated treatment well    Behavior During Therapy [facility] for tasks assessed/performed;Lability            Past Medical History:  Diagnosis Date   Anxiety    Arthralgia    Generalized weakness    bilateral upper and lower extremities,  walks with cane   GERD (gastroesophageal reflux disease)    Headache(784.0)    Heart murmur    per pt since childhood, no problems   History of kidney stones    Hypertension    followed pcp   IBS (irritable bowel syndrome)    Iron deficiency anemia    12-06-2017 per pt recently had IV iron infusion July 2019   Low vitamin D level    Osteoarthritis    pt unsure about this   PVC (premature ventricular contraction)    per pt had holter monitor   SLE (systemic lupus erythematosus) (HCC)    rheumotologist-- Amy Barry   Stroke [facility]) 06/2020   Wears glasses    Past Surgical History:  Procedure Laterality Date   CERVICAL DISC ARTHROPLASTY N/A 10/23/2015   Procedure: Cervical Disc Arthroplasty Cervical six-seven;  Surgeon: Amy GORMAN Molt, MD;  Location: [facility] NEURO ORS;  Service: Neurosurgery;  Laterality: N/A;   D & C HYSTEROSCOPY W/ RESECTION ENDOMETRIAL POLYP  10-13-2003    Amy rivard @[facility]    ESOPHAGOGASTRODUODENOSCOPY     for GERD   IR ANGIO INTRA EXTRACRAN SEL COM CAROTID INNOMINATE BILAT MOD SED  07/06/2020   IR ANGIO INTRA EXTRACRAN SEL INTERNAL CAROTID BILAT MOD SED  06/29/2020   IR ANGIO  VERTEBRAL SEL VERTEBRAL BILAT MOD SED  06/29/2020   IR ANGIO VERTEBRAL SEL VERTEBRAL BILAT MOD SED  07/06/2020   LAPAROSCOPIC ABDOMINAL EXPLORATION  1996   dx ibs   OVARIAN CYST SURGERY  age 71   x2 cyst   RADIOLOGY WITH ANESTHESIA N/A 06/29/2020   Procedure: IR WITH ANESTHESIA;  Surgeon: Radiologist, Medication, MD;  Location: [facility] OR;  Service: Radiology;  Laterality: N/A;   ROBOTIC ASSISTED TOTAL HYSTERECTOMY N/A 12/13/2017   Procedure: XI ROBOTIC ASSISTED TOTAL HYSTERECTOMYWITH BILATERAL SALPINGECTOMY;  Surgeon: Amy Sawyer, MD;  Location: [facility] ORS;  Service: Gynecology;  Laterality: N/A;   TUBAL LIGATION Bilateral 06-01-2002   Amy Amy Barry @[facility]    PPTL   Patient Active Problem List   Diagnosis Date Noted   Migraine 04/19/2023   Generalized anxiety disorder 05/05/2021   Biceps tendonitis on left 02/10/2021   Vascular headache 08/05/2020   Hyponatremia 07/28/2020   Headache 07/24/2020   Neck pain, musculoskeletal 07/24/2020   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    Subarachnoid bleed (HCC) 07/09/2020   Subarachnoid hemorrhage (HCC) 06/29/2020   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HCC) 03/04/2019   S/P hysterectomy 12/13/2017   Abnormal laboratory test 09/09/2016  Gait abnormality 08/03/2016   Chronic fatigue 08/03/2016   Paresthesia 08/03/2016   S/P cervical spinal fusion 10/23/2015   Hypersomnia 12/08/2010   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   EPIGASTRIC PAIN 10/13/2009   IRRITABLE BOWEL SYNDROME 03/10/2009   BACK PAIN, LUMBAR 07/01/2008   COSTOCHONDRITIS 02/01/2008   VIRAL URI 01/08/2008   CHEST PAIN 01/08/2008   OTHER SPECIFIED EPISODIC MOOD DISORDER 12/25/2007   ARM PAIN 12/25/2007   ANEMIA-NOS 10/02/2007   Essential hypertension 10/02/2007   GERD 10/02/2007   POLYP, GALLBLADDER 10/02/2007   UTERINE POLYP 10/02/2007   OSTEOARTHRITIS 10/02/2007   Dyskinesia 10/02/2007   HEADACHE 10/02/2007   PALPITATIONS 10/02/2007   CARDIAC MURMUR, HX OF 10/02/2007   NEPHROLITHIASIS,  HX OF 10/02/2007    ONSET DATE: 10/11/2023 (referral)   REFERRING DIAG: M54.2 (ICD-10-CM) - Neck pain  THERAPY DIAG:  Muscle weakness (generalized)  Cervicalgia  Abnormal posture  Rationale for Evaluation and Treatment: Rehabilitation  SUBJECTIVE:                                                                                                                                                                                             SUBJECTIVE STATEMENT:  Pt reports that her pain was doing better for a few weeks but it started back up last night. Now she is feeling a burning pain in her R upper trap area and feels like that area is swollen. She did feel much better after the dry needling last visit and her headaches had reduced as well. Her headaches started up again about 2-3 days ago. She also took a shot for her migraines and feels like that worked for 2-3 weeks as well before her pain returned. She has been using Tylenol  for pain management for the past few days. She does continue to work on her stretches prescribed last visit as well.  Pt also reports that she has been feeling very fatigued and sleepy for the last few days. She typically does not sleep well as one of her medications keeps her wired up so it is strange for her to feel this way.  Pt accompanied by: Amy Barry   PERTINENT HISTORY: anxiety, GERD, headache, HTN, SLE, CVA and aneurysm with resultant dysarthria (2022),  PAIN:  Are you having pain? Yes: NPRS scale: 7-8/10  Pain location: R upper trap, headache Pain description: Achy, sharp, throbbing  Aggravating factors: Catatonic episodes, anxiety  Relieving factors: None  PRECAUTIONS: Fall  RED FLAGS: None   WEIGHT BEARING RESTRICTIONS: No  FALLS: Has patient fallen in last 6 months? No   PLOF: Needs assistance with ADLs, Needs assistance with homemaking, Needs  assistance with gait, and Needs assistance with transfers  PATIENT GOALS: Unable to  assess on eval due to emotions and aphasia   OBJECTIVE:  Note: Objective measures were completed at Evaluation unless otherwise noted.  DIAGNOSTIC FINDINGS: From Amy. Jayme note on 10/11/23:  10/27/2011 MRI CERVICAL SPINE WO:  Tiny central disc bulges from C2-3 through C4-5 with no significant neural impingement. 06/19/2012 CTA HEAD:  Negative 09/12/2015 MRI CERVICAL SPINE WO:  Progressed cervical spondylosis and degenerative disc disease with left foraminal disc protrusion and nerve impingement at C6-7, moderate impingement at C3-4 and C4-5 and mild impingement at C5-6. 08/03/2016 MRI BRAIN WO:  Unremarkable 08/03/2016 MRI CERVICAL SPINE WO:  Status post arthrodesis at C6-7 with questionable posterior migration of C6-7 implant, mild to moderate canal stenosis with mild cord flattening without definite cord signal changes; moderate degenerative spondylosis with disc protrusions at C3-4, C4-5, and C5-6. 08/04/2016 MRI CERVICAL SPINE WO:  C6-7 arthrodesis now appears in place. 10/06/2016 MRI LUMBAR SPINE WO:  Degenerative disc disease and mild bilateral foraminal narrowing at L5-S1 without significant compression; mild ligamentum flavum hypertrophy and facet degenerative facet changes at L3-4 and L4-5 without compression. 03/04/2019 X-RAY BILATERAL HIP/PELVIS:  Mild degenerative changes both hips.  Otherwise, unremarkable. 05/18/2019 MRI BRAIN W WO:  Normal exam.  No change since 2018. 05/18/2019 MRI C-SPINE W WO:  C3-4: Chronic central disc herniation effaces the ventral subarachnoid space and indents the ventral cord. AP diameter of the canal in the midline only 6.5 mm. Similar appearance to the study of 2018, possibly with minimal enlargement of the disc. C4-5: Chronic central disc herniation effaces the ventral subarachnoid space and indents the ventral cord. AP diameter of the canal in the midline is 8 mm. Similar appearance to the study of 2018, possibly with minimal enlargement of the disc.   C5-6: Mild noncompressive disc bulge.  C6-7: Previous disc arthroplasty. Extensive artifact precludes MR evaluation of the canal or foramina at this level. 06/21/2019 MRI T-SPINE W WO:  Normal thoracic spine MRI. No finding to explain the patient's symptoms. 06/21/2019 MRI L-SPINE W WO:  Mild degenerative disc disease L3-4 and L5-S1 without central canal or foraminal stenosis. The examination is otherwise negative. No finding to explain the patient's symptoms. 06/29/2020 CT HEAD:  Subarachnoid hemorrhage in the posterior fossa with aneurysmal pattern. There is intraventricular reflux and ventriculomegaly. 06/29/2020 CTA HEAD &  NECK:  No definite source of subarachnoid hemorrhage but there poor visualization of the proximal left PICA with a tiny bulbous area, question ruptured dissection. 07/06/2020 CEREBRAL ANGIOGRAM:  1. No intracranial aneurysms, arteriovenous malformations, or  fistulas are identified. 2. Mild, nonflow limiting vasospasm involving the supraclinoid  segment of the internal carotid arteries bilaterally.  11/25/2020 MRA HEAD WO:  1. Question 2 mm saccular outpouching extending medially and inferiorly from the paraophthalmic left ICA. Finding is indeterminate, but could reflect a small paraophthalmic aneurysm. This is not definitely seen on previous exams. 2. Otherwise normal intracranial MRA. 08/27/2023 MRI BRAIN WO:  1. No acute intracranial abnormality. 2. 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  COGNITION: Overall cognitive status: Impaired   SENSATION: Not tested    POSTURE: rounded shoulders, forward head, and increased thoracic kyphosis   LOWER EXTREMITY MMT:    MMT Right Eval Left Eval  Hip flexion    Hip extension    Hip abduction    Hip adduction    Hip internal rotation    Hip external rotation  Knee flexion    Knee extension    Ankle dorsiflexion    Ankle plantarflexion     Ankle inversion    Ankle eversion    (Blank rows = not tested)  CERVICAL ROM: tested in seated position  Active ROM A/PROM (deg) eval  Flexion 31  Extension 30  Right lateral flexion 34  Left lateral flexion 47  Right rotation 67  Left rotation 40   (Blank rows = not tested)   PALPATION: TTP along R upper trap, R suboccipitals, R rhomboids and bilateral semispinalis capitis   TRANSFERS: Sit to stand: Modified independence  Assistive device utilized: None     Stand to sit: Modified independence  Assistive device utilized: None      RAMP:  Not tested  CURB:  Not tested  STAIRS: Not tested  GAIT: Gait pattern: WFL Distance walked: Various clinic distances  Assistive device utilized: None Level of assistance: Modified independence Comments: No instability noted    PATIENT SURVEYS:  NDI: to be assessed    VITALS  There were no vitals filed for this visit.                                                                                                                                TREATMENT:    TherEx To address cervical and shoulder muscle tightness following TPDN: Supine suboccipital release with tennis balls x 5 min  Added to HEP, see bolded below  TherAct  Trigger Point Dry Needling  Subsequent Treatment: Instructions provided previously at initial dry needling treatment.   Patient Verbal Consent Given: Yes Education Handout Provided: Previously Provided Muscles Treated: L and R suboccipitals, L and R UT Electrical Stimulation Performed: No Treatment Response/Outcome: deep ache/pressure; muscle twitch detected - very good twitch in R UT with emotional response     PATIENT EDUCATION: Education details: TPDN, continue HEP and added to HEP Person educated: Patient Education method: Explanation, Demonstration, and Handouts Education comprehension: verbalized understanding, returned demonstration, and needs further education  HOME EXERCISE  PROGRAM: Access Code: [email or web address] URL: [email or web address] Date: 10/26/2023 Prepared by: Vianey Caniglia  Exercises - Seated Upper Trapezius Stretch  - 1 x daily - 7 x weekly - 1 sets - 3-5 reps - 30 sec hold - Gentle Levator Scapulae Stretch  - 1 x daily - 7 x weekly - 1 sets - 3-5 reps - 30 sec hold - Supine Suboccipital Release with Tennis Balls  - 1 x daily - 7 x weekly - 1 sets - 1 reps - 5 min hold  GOALS: Goals reviewed with patient? Yes  SHORT TERM GOALS: Target date: 11/17/2023   Pt will be independent with initial HEP for improved strength and cervical ROM.  Baseline: not established on eval  Goal status: INITIAL  2.  NDI to be assessed and LTG updated  Baseline: 16/45 (7/24) Goal status: MET  3.  Pt will improve cervical flexion A/ROM to >/= 45  degrees for improved functional use of C-spine and reduced pain  Baseline:  Active ROM A/PROM (deg) eval  Flexion 31  Extension 30  Right lateral flexion 34  Left lateral flexion 47  Right rotation 67  Left rotation 40   (Blank rows = not tested)  Goal status: INITIAL  4.  Pt will improved L cervical rotation A/ROM to >/= 50 degrees for reduced pain and improved quality of life  Baseline:  Active ROM A/PROM (deg) eval  Flexion 31  Extension 30  Right lateral flexion 34  Left lateral flexion 47  Right rotation 67  Left rotation 40   (Blank rows = not tested)  Goal status: INITIAL   LONG TERM GOALS: Target date: 12/01/2023  Pt will improve her score on the NDI to </= 11/45 to demonstrate improved function and decreased disability level Baseline: 16/45 (7/24), moderate disability Goal status: INITIAL  2.  Pt will improved R lateral flexion cervical A/ROM to >/= 45 degrees for improved functional use of C-spine and reduced pain  Baseline:  Active ROM A/PROM (deg) eval  Flexion 31  Extension 30  Right lateral flexion 34  Left lateral flexion 47  Right rotation 67  Left rotation 40   (Blank  rows = not tested)  Goal status: INITIAL  3.  Pt will be independent with final HEP for improved strength and cervical ROM.  Baseline:  Goal status: INITIAL   ASSESSMENT:  CLINICAL IMPRESSION: Session limited by patient's late arrival. Emphasis of skilled PT session on performing TPDN again to address flare-up of headache symptoms and R UT pain as well as addition of suboccipital release to HEP. Pt with good response to DN this visit, especially in her R UT region. She continues to benefit from skilled PT services to work towards improved function and decreased disability level. Continue POC.    OBJECTIVE IMPAIRMENTS: decreased cognition, decreased knowledge of condition, decreased mobility, decreased ROM, decreased strength, impaired perceived functional ability, impaired flexibility, impaired UE functional use, improper body mechanics, postural dysfunction, and pain  ACTIVITY LIMITATIONS: carrying, lifting, sleeping, transfers, reach over head, locomotion level, and caring for others  PARTICIPATION LIMITATIONS: meal prep, cleaning, laundry, medication management, interpersonal relationship, driving, shopping, community activity, occupation, and yard work  PERSONAL FACTORS: Behavior pattern, Past/current experiences, Time since onset of injury/illness/exacerbation, and 1-2 comorbidities: hx of SAH w/unknown etiology, expressive aphasia are also affecting patient's functional outcome.   REHAB POTENTIAL: Good  CLINICAL DECISION MAKING: Unstable/unpredictable  EVALUATION COMPLEXITY: High  PLAN:  PT FREQUENCY: 2x/week  PT DURATION: 6 weeks  PLANNED INTERVENTIONS: 02835- PT Re-evaluation, 97750- Physical Performance Testing, 97110-Therapeutic exercises, 97530- Therapeutic activity, [id number]- Neuromuscular re-education, 97535- Self Care, [id number]- Manual therapy, [id number]- Gait training, [phone]- Aquatic Therapy, [phone] (1-2 muscles), [id number] (3+ muscles)- Dry Needling, Patient/Family education,  Balance training, Joint mobilization, Spinal mobilization, and Vestibular training  PLAN FOR NEXT SESSION: response to DN? TPDN to R upper traps, suboccipitals, semispinalis. Add to HEP for gentle upper trap stretch and cervical ROM and strengthening, Watch vitals    Waddell Southgate, PT Waddell Southgate, PT, DPT, CSRS  11/02/2023, 10:15 AM

## 2023-11-07 ENCOUNTER — Ambulatory Visit: Attending: Neurology | Admitting: Physical Therapy

## 2023-11-07 DIAGNOSIS — R2689 Other abnormalities of gait and mobility: Secondary | ICD-10-CM | POA: Insufficient documentation

## 2023-11-07 DIAGNOSIS — R293 Abnormal posture: Secondary | ICD-10-CM | POA: Diagnosis not present

## 2023-11-07 DIAGNOSIS — M542 Cervicalgia: Secondary | ICD-10-CM | POA: Insufficient documentation

## 2023-11-07 DIAGNOSIS — M6281 Muscle weakness (generalized): Secondary | ICD-10-CM | POA: Insufficient documentation

## 2023-11-07 NOTE — Therapy (Signed)
 OUTPATIENT PHYSICAL THERAPY NEURO TREATMENT   Patient Name: Amy Barry MRN: [id number] DOB:05-12-1970, 53 y.o., female Today's Date: 11/07/2023   PCP: Sun, Vyvyan, MD REFERRING PROVIDER: Skeet Juliene SAUNDERS, DO  END OF SESSION:  PT End of Session - 11/07/23 0851     Visit Number 4    Number of Visits 13   Plus eval   Date for PT Re-Evaluation 12/08/23    Authorization Type [employer]    Authorization Time Period Auth requested on 7/18    PT Start Time 0851   pt arrived late   PT Stop Time 0929    PT Time Calculation (min) 38 min    Activity Tolerance Patient tolerated treatment well    Behavior During Therapy [facility] for tasks assessed/performed             Past Medical History:  Diagnosis Date   Anxiety    Arthralgia    Generalized weakness    bilateral upper and lower extremities,  walks with cane   GERD (gastroesophageal reflux disease)    Headache(784.0)    Heart murmur    per pt since childhood, no problems   History of kidney stones    Hypertension    followed pcp   IBS (irritable bowel syndrome)    Iron deficiency anemia    12-06-2017 per pt recently had IV iron infusion July 2019   Low vitamin D level    Osteoarthritis    pt unsure about this   PVC (premature ventricular contraction)    per pt had holter monitor   SLE (systemic lupus erythematosus) (HCC)    rheumotologist-- dr syed   Stroke [facility]) 06/2020   Wears glasses    Past Surgical History:  Procedure Laterality Date   CERVICAL DISC ARTHROPLASTY N/A 10/23/2015   Procedure: Cervical Disc Arthroplasty Cervical six-seven;  Surgeon: Alm GORMAN Molt, MD;  Location: [facility] NEURO ORS;  Service: Neurosurgery;  Laterality: N/A;   D & C HYSTEROSCOPY W/ RESECTION ENDOMETRIAL POLYP  10-13-2003    dr rivard @[facility]    ESOPHAGOGASTRODUODENOSCOPY     for GERD   IR ANGIO INTRA EXTRACRAN SEL COM CAROTID INNOMINATE BILAT MOD SED  07/06/2020   IR ANGIO INTRA EXTRACRAN SEL INTERNAL CAROTID BILAT MOD SED  06/29/2020   IR ANGIO  VERTEBRAL SEL VERTEBRAL BILAT MOD SED  06/29/2020   IR ANGIO VERTEBRAL SEL VERTEBRAL BILAT MOD SED  07/06/2020   LAPAROSCOPIC ABDOMINAL EXPLORATION  1996   dx ibs   OVARIAN CYST SURGERY  age 36   x2 cyst   RADIOLOGY WITH ANESTHESIA N/A 06/29/2020   Procedure: IR WITH ANESTHESIA;  Surgeon: Radiologist, Medication, MD;  Location: [facility] OR;  Service: Radiology;  Laterality: N/A;   ROBOTIC ASSISTED TOTAL HYSTERECTOMY N/A 12/13/2017   Procedure: XI ROBOTIC ASSISTED TOTAL HYSTERECTOMYWITH BILATERAL SALPINGECTOMY;  Surgeon: Rosalva Sawyer, MD;  Location: [facility] ORS;  Service: Gynecology;  Laterality: N/A;   TUBAL LIGATION Bilateral 06-01-2002   dr henry @[facility]    PPTL   Patient Active Problem List   Diagnosis Date Noted   Migraine 04/19/2023   Generalized anxiety disorder 05/05/2021   Biceps tendonitis on left 02/10/2021   Vascular headache 08/05/2020   Hyponatremia 07/28/2020   Headache 07/24/2020   Neck pain, musculoskeletal 07/24/2020   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    Subarachnoid bleed (HCC) 07/09/2020   Subarachnoid hemorrhage (HCC) 06/29/2020   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HCC) 03/04/2019   S/P hysterectomy 12/13/2017   Abnormal laboratory test  09/09/2016   Gait abnormality 08/03/2016   Chronic fatigue 08/03/2016   Paresthesia 08/03/2016   S/P cervical spinal fusion 10/23/2015   Hypersomnia 12/08/2010   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   EPIGASTRIC PAIN 10/13/2009   IRRITABLE BOWEL SYNDROME 03/10/2009   BACK PAIN, LUMBAR 07/01/2008   COSTOCHONDRITIS 02/01/2008   VIRAL URI 01/08/2008   CHEST PAIN 01/08/2008   OTHER SPECIFIED EPISODIC MOOD DISORDER 12/25/2007   ARM PAIN 12/25/2007   ANEMIA-NOS 10/02/2007   Essential hypertension 10/02/2007   GERD 10/02/2007   POLYP, GALLBLADDER 10/02/2007   UTERINE POLYP 10/02/2007   OSTEOARTHRITIS 10/02/2007   Dyskinesia 10/02/2007   HEADACHE 10/02/2007   PALPITATIONS 10/02/2007   CARDIAC MURMUR, HX OF 10/02/2007   NEPHROLITHIASIS,  HX OF 10/02/2007    ONSET DATE: 10/11/2023 (referral)   REFERRING DIAG: M54.2 (ICD-10-CM) - Neck pain  THERAPY DIAG:  Muscle weakness (generalized)  Cervicalgia  Abnormal posture  Other abnormalities of gait and mobility  Rationale for Evaluation and Treatment: Rehabilitation  SUBJECTIVE:                                                                                                                                                                                             SUBJECTIVE STATEMENT:  Pt reports that her pain is feeling a little bit better today. She thinks the tennis balls have been helping, hump on her R side is not as severe. She felt a whole lot better after the DN last visit. Pt with ongoing soreness in her R pecs.  Pt accompanied by: [name]; daughter [name]   PERTINENT HISTORY: anxiety, GERD, headache, HTN, SLE, CVA and aneurysm with resultant dysarthria (2022),  PAIN:  Are you having pain? Yes: NPRS scale: 3-4/10  Pain location: R upper trap, headache Pain description: Achy, sharp, throbbing  Aggravating factors: Catatonic episodes, anxiety  Relieving factors: None  PRECAUTIONS: Fall  RED FLAGS: None   WEIGHT BEARING RESTRICTIONS: No  FALLS: Has patient fallen in last 6 months? No   PLOF: Needs assistance with ADLs, Needs assistance with homemaking, Needs assistance with gait, and Needs assistance with transfers  PATIENT GOALS: Unable to assess on eval due to emotions and aphasia   OBJECTIVE:  Note: Objective measures were completed at Evaluation unless otherwise noted.  DIAGNOSTIC FINDINGS: From Dr. Jayme note on 10/11/23:  10/27/2011 MRI CERVICAL SPINE WO:  Tiny central disc bulges from C2-3 through C4-5 with no significant neural impingement. 06/19/2012 CTA HEAD:  Negative 09/12/2015 MRI CERVICAL SPINE WO:  Progressed cervical spondylosis and degenerative disc disease with left foraminal disc protrusion and nerve impingement  at C6-7, moderate impingement at C3-4  and C4-5 and mild impingement at C5-6. 08/03/2016 MRI BRAIN WO:  Unremarkable 08/03/2016 MRI CERVICAL SPINE WO:  Status post arthrodesis at C6-7 with questionable posterior migration of C6-7 implant, mild to moderate canal stenosis with mild cord flattening without definite cord signal changes; moderate degenerative spondylosis with disc protrusions at C3-4, C4-5, and C5-6. 08/04/2016 MRI CERVICAL SPINE WO:  C6-7 arthrodesis now appears in place. 10/06/2016 MRI LUMBAR SPINE WO:  Degenerative disc disease and mild bilateral foraminal narrowing at L5-S1 without significant compression; mild ligamentum flavum hypertrophy and facet degenerative facet changes at L3-4 and L4-5 without compression. 03/04/2019 X-RAY BILATERAL HIP/PELVIS:  Mild degenerative changes both hips.  Otherwise, unremarkable. 05/18/2019 MRI BRAIN W WO:  Normal exam.  No change since 2018. 05/18/2019 MRI C-SPINE W WO:  C3-4: Chronic central disc herniation effaces the ventral subarachnoid space and indents the ventral cord. AP diameter of the canal in the midline only 6.5 mm. Similar appearance to the study of 2018, possibly with minimal enlargement of the disc. C4-5: Chronic central disc herniation effaces the ventral subarachnoid space and indents the ventral cord. AP diameter of the canal in the midline is 8 mm. Similar appearance to the study of 2018, possibly with minimal enlargement of the disc.  C5-6: Mild noncompressive disc bulge.  C6-7: Previous disc arthroplasty. Extensive artifact precludes MR evaluation of the canal or foramina at this level. 06/21/2019 MRI T-SPINE W WO:  Normal thoracic spine MRI. No finding to explain the patient's symptoms. 06/21/2019 MRI L-SPINE W WO:  Mild degenerative disc disease L3-4 and L5-S1 without central canal or foraminal stenosis. The examination is otherwise negative. No finding to explain the patient's symptoms. 06/29/2020 CT HEAD:  Subarachnoid hemorrhage  in the posterior fossa with aneurysmal pattern. There is intraventricular reflux and ventriculomegaly. 06/29/2020 CTA HEAD &  NECK:  No definite source of subarachnoid hemorrhage but there poor visualization of the proximal left PICA with a tiny bulbous area, question ruptured dissection. 07/06/2020 CEREBRAL ANGIOGRAM:  1. No intracranial aneurysms, arteriovenous malformations, or  fistulas are identified. 2. Mild, nonflow limiting vasospasm involving the supraclinoid  segment of the internal carotid arteries bilaterally.  11/25/2020 MRA HEAD WO:  1. Question 2 mm saccular outpouching extending medially and inferiorly from the paraophthalmic left ICA. Finding is indeterminate, but could reflect a small paraophthalmic aneurysm. This is not definitely seen on previous exams. 2. Otherwise normal intracranial MRA. 08/27/2023 MRI BRAIN WO:  1. No acute intracranial abnormality. 2. 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  COGNITION: Overall cognitive status: Impaired   SENSATION: Not tested    POSTURE: rounded shoulders, forward head, and increased thoracic kyphosis   LOWER EXTREMITY MMT:    MMT Right Eval Left Eval  Hip flexion    Hip extension    Hip abduction    Hip adduction    Hip internal rotation    Hip external rotation    Knee flexion    Knee extension    Ankle dorsiflexion    Ankle plantarflexion    Ankle inversion    Ankle eversion    (Blank rows = not tested)  CERVICAL ROM: tested in seated position  Active ROM A/PROM (deg) eval  Flexion 31  Extension 30  Right lateral flexion 34  Left lateral flexion 47  Right rotation 67  Left rotation 40   (Blank rows = not tested)   PALPATION: TTP along R upper trap, R suboccipitals, R rhomboids and bilateral semispinalis  capitis   TRANSFERS: Sit to stand: Modified independence  Assistive device utilized: None     Stand to sit: Modified  independence  Assistive device utilized: None      RAMP:  Not tested  CURB:  Not tested  STAIRS: Not tested  GAIT: Gait pattern: WFL Distance walked: Various clinic distances  Assistive device utilized: None Level of assistance: Modified independence Comments: No instability noted    PATIENT SURVEYS:  NDI: to be assessed    VITALS  There were no vitals filed for this visit.                                                                                                                                TREATMENT:     TherAct To address complaints of R pec tightness: Attempted standing doorway pec stretch, unable to tolerate UE in this position Supine ER PROM, onset of muscle guarding and fear from patient Talked her through gentle AROM of R shoulder joint for ER and IR She feels a stretch in her R pec with this as well A good stretch with IR, some pain with ER Supine B shoulder AAROM with dowel rod x 10 reps Manual assist to prevent full shoulder flexion due to onset of pain at end range During last rep pt has onset of pain in R shoulder with eccentric lowering of BUE   To address ongoing pain in R UT and SCM tightness: Seated R UT stretch 3 x 30 sec Seated R SCM stretch 3 x 30 sec  Manual Therapy STM and TPR to R upper traps following stretching due to increase in pain and tightness in this muscle.     PATIENT EDUCATION: Education details: continue HEP and added to HEP Person educated: Patient Education method: Programmer, multimedia, Demonstration, and Handouts Education comprehension: verbalized understanding, returned demonstration, and needs further education  HOME EXERCISE PROGRAM: Access Code: [email or web address] URL: [email or web address] Date: 10/26/2023 Prepared by: Lavona Norsworthy  Exercises - Seated Upper Trapezius Stretch  - 1 x daily - 7 x weekly - 1 sets - 3-5 reps - 30 sec hold - Gentle Levator Scapulae Stretch  - 1 x daily - 7 x weekly - 1 sets -  3-5 reps - 30 sec hold - Supine Suboccipital Release with Tennis Balls  - 1 x daily - 7 x weekly - 1 sets - 1 reps - 5 min hold - Sternocleidomastoid Stretch  - 1 x daily - 7 x weekly - 1 sets - 3-5 reps - 30 sec hold - Sternocleidomastoid Release  - 1 x daily - 7 x weekly - 1 sets - 1 reps - 5 min hold - Supine Shoulder External Rotation Stretch  - 1 x daily - 7 x weekly - 1 sets - 10 reps - 5-10 sec hold  GOALS: Goals reviewed with patient? Yes  SHORT TERM GOALS: Target date: 11/17/2023   Pt will be independent with initial HEP for  improved strength and cervical ROM.  Baseline: not established on eval  Goal status: INITIAL  2.  NDI to be assessed and LTG updated  Baseline: 16/45 (7/24) Goal status: MET  3.  Pt will improve cervical flexion A/ROM to >/= 45 degrees for improved functional use of C-spine and reduced pain  Baseline:  Active ROM A/PROM (deg) eval  Flexion 31  Extension 30  Right lateral flexion 34  Left lateral flexion 47  Right rotation 67  Left rotation 40   (Blank rows = not tested)  Goal status: INITIAL  4.  Pt will improved L cervical rotation A/ROM to >/= 50 degrees for reduced pain and improved quality of life  Baseline:  Active ROM A/PROM (deg) eval  Flexion 31  Extension 30  Right lateral flexion 34  Left lateral flexion 47  Right rotation 67  Left rotation 40   (Blank rows = not tested)  Goal status: INITIAL   LONG TERM GOALS: Target date: 12/01/2023  Pt will improve her score on the NDI to </= 11/45 to demonstrate improved function and decreased disability level Baseline: 16/45 (7/24), moderate disability Goal status: INITIAL  2.  Pt will improved R lateral flexion cervical A/ROM to >/= 45 degrees for improved functional use of C-spine and reduced pain  Baseline:  Active ROM A/PROM (deg) eval  Flexion 31  Extension 30  Right lateral flexion 34  Left lateral flexion 47  Right rotation 67  Left rotation 40   (Blank rows = not  tested)  Goal status: INITIAL  3.  Pt will be independent with final HEP for improved strength and cervical ROM.  Baseline:  Goal status: INITIAL   ASSESSMENT:  CLINICAL IMPRESSION: Session limited by patient's late arrival. Emphasis of skilled PT session on addressing R pec pain, onset of R shoulder pain, ongoing pain in R UT, and pain in R SCM. Pt with significant muscle guarding this date and benefits from gentle stretching and ROM. Her function continues to be limited by her ongoing pain and muscle tightness. She continues to benefit from skilled PT services to work towards improved function and decreased disability level. Continue POC.    OBJECTIVE IMPAIRMENTS: decreased cognition, decreased knowledge of condition, decreased mobility, decreased ROM, decreased strength, impaired perceived functional ability, impaired flexibility, impaired UE functional use, improper body mechanics, postural dysfunction, and pain  ACTIVITY LIMITATIONS: carrying, lifting, sleeping, transfers, reach over head, locomotion level, and caring for others  PARTICIPATION LIMITATIONS: meal prep, cleaning, laundry, medication management, interpersonal relationship, driving, shopping, community activity, occupation, and yard work  PERSONAL FACTORS: Behavior pattern, Past/current experiences, Time since onset of injury/illness/exacerbation, and 1-2 comorbidities: hx of SAH w/unknown etiology, expressive aphasia are also affecting patient's functional outcome.   REHAB POTENTIAL: Good  CLINICAL DECISION MAKING: Unstable/unpredictable  EVALUATION COMPLEXITY: High  PLAN:  PT FREQUENCY: 2x/week  PT DURATION: 6 weeks  PLANNED INTERVENTIONS: [id number]- PT Re-evaluation, 97750- Physical Performance Testing, 97110-Therapeutic exercises, 97530- Therapeutic activity, [id number]- Neuromuscular re-education, 97535- Self Care, [id number]- Manual therapy, [id number]- Gait training, [phone]- Aquatic Therapy, [phone] (1-2 muscles), [id number] (3+  muscles)- Dry Needling, Patient/Family education, Balance training, Joint mobilization, Spinal mobilization, and Vestibular training  PLAN FOR NEXT SESSION: TPDN to R upper traps, suboccipitals, semispinalis. Add to HEP for gentle upper trap stretch and cervical ROM and strengthening, Watch vitals, how is R pec and shoulder doing?   Keena Dinse, PT [place]ell [place], PT, DPT, CSRS  11/07/2023, 9:30 AM

## 2023-11-09 ENCOUNTER — Ambulatory Visit: Admitting: Physical Therapy

## 2023-11-10 ENCOUNTER — Other Ambulatory Visit: Payer: Self-pay

## 2023-11-10 ENCOUNTER — Ambulatory Visit: Admitting: Physical Therapy

## 2023-11-10 DIAGNOSIS — M542 Cervicalgia: Secondary | ICD-10-CM | POA: Diagnosis not present

## 2023-11-10 DIAGNOSIS — R2689 Other abnormalities of gait and mobility: Secondary | ICD-10-CM | POA: Diagnosis not present

## 2023-11-10 DIAGNOSIS — M6281 Muscle weakness (generalized): Secondary | ICD-10-CM | POA: Diagnosis not present

## 2023-11-10 DIAGNOSIS — R293 Abnormal posture: Secondary | ICD-10-CM | POA: Diagnosis not present

## 2023-11-10 DIAGNOSIS — I609 Nontraumatic subarachnoid hemorrhage, unspecified: Secondary | ICD-10-CM

## 2023-11-10 MED ORDER — METHYLPHENIDATE HCL ER (OSM) 36 MG PO TBCR: 36.0000 mg | EXTENDED_RELEASE_TABLET | Freq: Every day | ORAL | 0 refills | Status: DC

## 2023-11-10 NOTE — Telephone Encounter (Signed)
 PMP was Reviewed.  Dr Babs note was reviewed Concerta  e-scribed today.  My-chart message sent to Ms. Romack regarding the above.

## 2023-11-10 NOTE — Therapy (Signed)
 OUTPATIENT PHYSICAL THERAPY NEURO TREATMENT   Patient Name: Amy Barry MRN: [id number] DOB:[birth year], 53 y.o., female Today's Date: 11/10/2023   PCP: Sun, Vyvyan, MD REFERRING PROVIDER: Skeet Juliene SAUNDERS, DO  END OF SESSION:  PT End of Session - 11/10/23 0850     Visit Number 5    Number of Visits 13   Plus eval   Date for PT Re-Evaluation 12/08/23    Authorization Type [employer]    Authorization Time Period Auth requested on 7/18    PT Start Time 0849    PT Stop Time 0932    PT Time Calculation (min) 43 min    Activity Tolerance Patient tolerated treatment well    Behavior During Therapy [facility] for tasks assessed/performed              Past Medical History:  Diagnosis Date   Anxiety    Arthralgia    Generalized weakness    bilateral upper and lower extremities,  walks with cane   GERD (gastroesophageal reflux disease)    Headache(784.0)    Heart murmur    per pt since childhood, no problems   History of kidney stones    Hypertension    followed pcp   IBS (irritable bowel syndrome)    Iron deficiency anemia    12-06-2017 per pt recently had IV iron infusion July 2019   Low vitamin D level    Osteoarthritis    pt unsure about this   PVC (premature ventricular contraction)    per pt had holter monitor   SLE (systemic lupus erythematosus) (HCC)    rheumotologist-- dr syed   Stroke [facility]) 06/2020   Wears glasses    Past Surgical History:  Procedure Laterality Date   CERVICAL DISC ARTHROPLASTY N/A 10/23/2015   Procedure: Cervical Disc Arthroplasty Cervical six-seven;  Surgeon: Alm GORMAN Molt, MD;  Location: [facility]UR[facility];  Service: Neurosurgery;  Laterality: N/A;   D & C HYSTEROSCOPY W/ RESECTION ENDOMETRIAL POLYP  10-13-2003    dr rivard @[facility]    ESOPHAGOGASTRODUODENOSCOPY     for GERD   IR ANGIO INTRA EXTRACRAN SEL COM CAROTID INNOMINATE BILAT MOD SED  07/06/2020   IR ANGIO INTRA EXTRACRAN SEL INTERNAL CAROTID BILAT MOD SED  06/29/2020   IR ANGIO VERTEBRAL SEL VERTEBRAL  BILAT MOD SED  06/29/2020   IR ANGIO VERTEBRAL SEL VERTEBRAL BILAT MOD SED  07/06/2020   LAPAROSCOPIC ABDOMINAL EXPLORATION  1996   dx ibs   OVARIAN CYST SURGERY  age 58   x2 cyst   RADIOLOGY WITH ANESTHESIA N/A 06/29/2020   Procedure: IR WITH ANESTHESIA;  Surgeon: Radiologist, Medication, MD;  Location: [facility] OR;  Service: Radiology;  Laterality: N/A;   ROBOTIC ASSISTED TOTAL HYSTERECTOMY N/A 12/13/2017   Procedure: XI ROBOTIC ASSISTED TOTAL HYSTERECTOMYWITH BILATERAL SALPINGECTOMY;  Surgeon: Rosalva Sawyer, MD;  Location: [facility] ORS;  Service: Gynecology;  Laterality: N/A;   TUBAL LIGATION Bilateral 06-01-2002   dr henry @[facility]    PPTL   Patient Active Problem List   Diagnosis Date Noted   Migraine 04/19/2023   Generalized anxiety disorder 05/05/2021   Biceps tendonitis on left 02/10/2021   Vascular headache 08/05/2020   Hyponatremia 07/28/2020   Headache 07/24/2020   Neck pain, musculoskeletal 07/24/2020   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    Subarachnoid bleed (HCC) 07/09/2020   Subarachnoid hemorrhage (HCC) 06/29/2020   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HCC) 03/04/2019   S/P hysterectomy 12/13/2017   Abnormal laboratory test 09/09/2016  Gait abnormality 08/03/2016   Chronic fatigue 08/03/2016   Paresthesia 08/03/2016   S/P cervical spinal fusion 10/23/2015   Hypersomnia 12/08/2010   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   EPIGASTRIC PAIN 10/13/2009   IRRITABLE BOWEL SYNDROME 03/10/2009   BACK PAIN, LUMBAR 07/01/2008   COSTOCHONDRITIS 02/01/2008   VIRAL URI 01/08/2008   CHEST PAIN 01/08/2008   OTHER SPECIFIED EPISODIC MOOD DISORDER 12/25/2007   ARM PAIN 12/25/2007   ANEMIA-NOS 10/02/2007   Essential hypertension 10/02/2007   GERD 10/02/2007   POLYP, GALLBLADDER 10/02/2007   UTERINE POLYP 10/02/2007   OSTEOARTHRITIS 10/02/2007   Dyskinesia 10/02/2007   HEADACHE 10/02/2007   PALPITATIONS 10/02/2007   CARDIAC MURMUR, HX OF 10/02/2007   NEPHROLITHIASIS, HX OF 10/02/2007     ONSET DATE: 10/11/2023 (referral)   REFERRING DIAG: M54.2 (ICD-10-CM) - Neck pain  THERAPY DIAG:  Muscle weakness (generalized)  Cervicalgia  Abnormal posture  Rationale for Evaluation and Treatment: Rehabilitation  SUBJECTIVE:                                                                                                                                                                                             SUBJECTIVE STATEMENT:  Pt reports that her pain is better today. Still has a knot on her upper trap that hurts to touch but overall is okay. HEP is going well, really enjoys the suboccipital release.   Pt accompanied by: [name]; daughter [name]   PERTINENT HISTORY: anxiety, GERD, headache, HTN, SLE, CVA and aneurysm with resultant dysarthria (2022),  PAIN:  Are you having pain? Yes: NPRS scale: 3-4/10  Pain location: R upper trap, Neck  Pain description: Achy, sharp, throbbing  Aggravating factors: Catatonic episodes, anxiety  Relieving factors: None  PRECAUTIONS: Fall  RED FLAGS: None   WEIGHT BEARING RESTRICTIONS: No  FALLS: Has patient fallen in last 6 months? No   PLOF: Needs assistance with ADLs, Needs assistance with homemaking, Needs assistance with gait, and Needs assistance with transfers  PATIENT GOALS: Unable to assess on eval due to emotions and aphasia   OBJECTIVE:  Note: Objective measures were completed at Evaluation unless otherwise noted.  DIAGNOSTIC FINDINGS: From Dr. Jayme note on 10/11/23:  10/27/2011 MRI CERVICAL SPINE WO:  Tiny central disc bulges from C2-3 through C4-5 with no significant neural impingement. 06/19/2012 CTA HEAD:  Negative 09/12/2015 MRI CERVICAL SPINE WO:  Progressed cervical spondylosis and degenerative disc disease with left foraminal disc protrusion and nerve impingement at C6-7, moderate impingement at C3-4 and C4-5 and mild impingement at C5-6. 08/03/2016 MRI BRAIN WO:   Unremarkable 08/03/2016 MRI CERVICAL SPINE WO:  Status post arthrodesis at  C6-7 with questionable posterior migration of C6-7 implant, mild to moderate canal stenosis with mild cord flattening without definite cord signal changes; moderate degenerative spondylosis with disc protrusions at C3-4, C4-5, and C5-6. 08/04/2016 MRI CERVICAL SPINE WO:  C6-7 arthrodesis now appears in place. 10/06/2016 MRI LUMBAR SPINE WO:  Degenerative disc disease and mild bilateral foraminal narrowing at L5-S1 without significant compression; mild ligamentum flavum hypertrophy and facet degenerative facet changes at L3-4 and L4-5 without compression. 03/04/2019 X-RAY BILATERAL HIP/PELVIS:  Mild degenerative changes both hips.  Otherwise, unremarkable. 05/18/2019 MRI BRAIN W WO:  Normal exam.  No change since 2018. 05/18/2019 MRI C-SPINE W WO:  C3-4: Chronic central disc herniation effaces the ventral subarachnoid space and indents the ventral cord. AP diameter of the canal in the midline only 6.5 mm. Similar appearance to the study of 2018, possibly with minimal enlargement of the disc. C4-5: Chronic central disc herniation effaces the ventral subarachnoid space and indents the ventral cord. AP diameter of the canal in the midline is 8 mm. Similar appearance to the study of 2018, possibly with minimal enlargement of the disc.  C5-6: Mild noncompressive disc bulge.  C6-7: Previous disc arthroplasty. Extensive artifact precludes MR evaluation of the canal or foramina at this level. 06/21/2019 MRI T-SPINE W WO:  Normal thoracic spine MRI. No finding to explain the patient's symptoms. 06/21/2019 MRI L-SPINE W WO:  Mild degenerative disc disease L3-4 and L5-S1 without central canal or foraminal stenosis. The examination is otherwise negative. No finding to explain the patient's symptoms. 06/29/2020 CT HEAD:  Subarachnoid hemorrhage in the posterior fossa with aneurysmal pattern. There is intraventricular reflux and  ventriculomegaly. 06/29/2020 CTA HEAD &  NECK:  No definite source of subarachnoid hemorrhage but there poor visualization of the proximal left PICA with a tiny bulbous area, question ruptured dissection. 07/06/2020 CEREBRAL ANGIOGRAM:  1. No intracranial aneurysms, arteriovenous malformations, or  fistulas are identified. 2. Mild, nonflow limiting vasospasm involving the supraclinoid  segment of the internal carotid arteries bilaterally.  11/25/2020 MRA HEAD WO:  1. Question 2 mm saccular outpouching extending medially and inferiorly from the paraophthalmic left ICA. Finding is indeterminate, but could reflect a small paraophthalmic aneurysm. This is not definitely seen on previous exams. 2. Otherwise normal intracranial MRA. 08/27/2023 MRI BRAIN WO:  1. No acute intracranial abnormality. 2. 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  COGNITION: Overall cognitive status: Impaired   SENSATION: Not tested    POSTURE: rounded shoulders, forward head, and increased thoracic kyphosis   LOWER EXTREMITY MMT:    MMT Right Eval Left Eval  Hip flexion    Hip extension    Hip abduction    Hip adduction    Hip internal rotation    Hip external rotation    Knee flexion    Knee extension    Ankle dorsiflexion    Ankle plantarflexion    Ankle inversion    Ankle eversion    (Blank rows = not tested)  CERVICAL ROM: tested in seated position  Active ROM A/PROM (deg) eval  Flexion 31  Extension 30  Right lateral flexion 34  Left lateral flexion 47  Right rotation 67  Left rotation 40   (Blank rows = not tested)   PALPATION: TTP along R upper trap, R suboccipitals, R rhomboids and bilateral semispinalis capitis   TRANSFERS: Sit to stand: Modified independence  Assistive device utilized: None     Stand to sit: Modified independence  Assistive device utilized: None      RAMP:  Not tested  CURB:  Not  tested  STAIRS: Not tested  GAIT: Gait pattern: WFL Distance walked: Various clinic distances  Assistive device utilized: None Level of assistance: Modified independence Comments: No instability noted    PATIENT SURVEYS:  NDI: to be assessed    VITALS  There were no vitals filed for this visit.                                                                                                                                TREATMENT:   TherAct Pt performed upper trap trigger point release w/theracane, x5 minutes. Educated pt and husband on where to purchase from [place], which husband purchased by end of session.  Supine lying on rolled towel placed under upper thoracic spine, x5 minutes, for gentle chest stretch and pain modulation. Pt reported feeling a good stretch w/this, so added to HEP (see bolded below). Added gentle cervical rotation to stretch, x3 minutes. Added bilateral shoulder abduction, x10 reps for added pec stretch, which pt tolerated well.  Supine cervical extension w/towel, x5 minutes, for improved cervical extension and pain modulation. Placed bolster under knees for reduced pressure on low back. Added to HEP (see bolded below)  Supine chin tucks w/towel under neck, x15 reps w/5s isometric hold. Added to HEP (see bolded below). Seated clockwise/counterclockwise neck circles, x10 reps each direction, for improved cervical ROM and pain modulation.    PATIENT EDUCATION: Education details: updates to HEP, where to Catering manager  Person educated: Patient Education method: Explanation, Demonstration, Verbal cues, and Handouts Education comprehension: verbalized understanding, returned demonstration, verbal cues required, and needs further education  HOME EXERCISE PROGRAM: Access Code: [email or web address] URL: [email or web address] Date: 10/26/2023 Prepared by: Taylor Turkalo  Exercises - Seated Upper Trapezius Stretch  - 1 x daily - 7 x weekly - 1 sets - 3-5  reps - 30 sec hold - Gentle Levator Scapulae Stretch  - 1 x daily - 7 x weekly - 1 sets - 3-5 reps - 30 sec hold - Supine Suboccipital Release with Tennis Balls  - 1 x daily - 7 x weekly - 1 sets - 1 reps - 5 min hold - Sternocleidomastoid Stretch  - 1 x daily - 7 x weekly - 1 sets - 3-5 reps - 30 sec hold - Sternocleidomastoid Release  - 1 x daily - 7 x weekly - 1 sets - 1 reps - 5 min hold - Supine Shoulder External Rotation Stretch  - 1 x daily - 7 x weekly - 1 sets - 10 reps - 5-10 sec hold - Supine Thoracic Mobilization Towel Roll Vertical with Arm Stretch  - 1 x daily - 7 x weekly - 3-5 minutes  hold - Hooklying Upper Neck Extension   - 1 x daily - 7 x weekly - 3 sets - 10 reps - Supine Cervical Retraction with Towel  -  1 x daily - 7 x weekly - 3 sets - 10 reps  GOALS: Goals reviewed with patient? Yes  SHORT TERM GOALS: Target date: 11/17/2023   Pt will be independent with initial HEP for improved strength and cervical ROM.  Baseline: not established on eval  Goal status: INITIAL  2.  NDI to be assessed and LTG updated  Baseline: 16/45 (7/24) Goal status: MET  3.  Pt will improve cervical flexion A/ROM to >/= 45 degrees for improved functional use of C-spine and reduced pain  Baseline:  Active ROM A/PROM (deg) eval  Flexion 31  Extension 30  Right lateral flexion 34  Left lateral flexion 47  Right rotation 67  Left rotation 40   (Blank rows = not tested)  Goal status: INITIAL  4.  Pt will improved L cervical rotation A/ROM to >/= 50 degrees for reduced pain and improved quality of life  Baseline:  Active ROM A/PROM (deg) eval  Flexion 31  Extension 30  Right lateral flexion 34  Left lateral flexion 47  Right rotation 67  Left rotation 40   (Blank rows = not tested)  Goal status: INITIAL   LONG TERM GOALS: Target date: 12/01/2023  Pt will improve her score on the NDI to </= 11/45 to demonstrate improved function and decreased disability level Baseline: 16/45  (7/24), moderate disability Goal status: INITIAL  2.  Pt will improved R lateral flexion cervical A/ROM to >/= 45 degrees for improved functional use of C-spine and reduced pain  Baseline:  Active ROM A/PROM (deg) eval  Flexion 31  Extension 30  Right lateral flexion 34  Left lateral flexion 47  Right rotation 67  Left rotation 40   (Blank rows = not tested)  Goal status: INITIAL  3.  Pt will be independent with final HEP for improved strength and cervical ROM.  Baseline:  Goal status: INITIAL   ASSESSMENT:  CLINICAL IMPRESSION: Emphasis of skilled PT session on improved cervical ROM, thoracic extension and trigger point release. Pt tolerated session well and enjoyed theracane, so husband purchased one off [employer] by end of session. Pt most challenged by cervical extension w/use of towel due to upper trap discomfort, but demonstrated improved ROM by end of activity. Continue POC.    OBJECTIVE IMPAIRMENTS: decreased cognition, decreased knowledge of condition, decreased mobility, decreased ROM, decreased strength, impaired perceived functional ability, impaired flexibility, impaired UE functional use, improper body mechanics, postural dysfunction, and pain  ACTIVITY LIMITATIONS: carrying, lifting, sleeping, transfers, reach over head, locomotion level, and caring for others  PARTICIPATION LIMITATIONS: meal prep, cleaning, laundry, medication management, interpersonal relationship, driving, shopping, community activity, occupation, and yard work  PERSONAL FACTORS: Behavior pattern, Past/current experiences, Time since onset of injury/illness/exacerbation, and 1-2 comorbidities: hx of SAH w/unknown etiology, expressive aphasia are also affecting patient's functional outcome.   REHAB POTENTIAL: Good  CLINICAL DECISION MAKING: Unstable/unpredictable  EVALUATION COMPLEXITY: High  PLAN:  PT FREQUENCY: 2x/week  PT DURATION: 6 weeks  PLANNED INTERVENTIONS: 02835- PT  Re-evaluation, 97750- Physical Performance Testing, 97110-Therapeutic exercises, 97530- Therapeutic activity, [id number]- Neuromuscular re-education, 97535- Self Care, [id number]- Manual therapy, [id number]- Gait training, [phone]- Aquatic Therapy, [phone] (1-2 muscles), [id number] (3+ muscles)- Dry Needling, Patient/Family education, Balance training, Joint mobilization, Spinal mobilization, and Vestibular training  PLAN FOR NEXT SESSION: Theracane? TPDN to R upper traps, suboccipitals, semispinalis. Add to HEP for gentle upper trap stretch and cervical ROM and strengthening, Watch vitals, how is R pec and shoulder doing?   Kalena Mander E Kieara Schwark, PT,  DPT  11/10/2023, 10:07 AM

## 2023-11-10 NOTE — Telephone Encounter (Signed)
 Dr. Babs is out of the office. Patient need Rx Methylphenidate  refilled. Please send to [facility] on [address] you.

## 2023-11-14 ENCOUNTER — Ambulatory Visit: Admitting: Physical Therapy

## 2023-11-14 DIAGNOSIS — R293 Abnormal posture: Secondary | ICD-10-CM | POA: Diagnosis not present

## 2023-11-14 DIAGNOSIS — M6281 Muscle weakness (generalized): Secondary | ICD-10-CM | POA: Diagnosis not present

## 2023-11-14 DIAGNOSIS — M542 Cervicalgia: Secondary | ICD-10-CM | POA: Diagnosis not present

## 2023-11-14 DIAGNOSIS — R2689 Other abnormalities of gait and mobility: Secondary | ICD-10-CM | POA: Diagnosis not present

## 2023-11-14 NOTE — Therapy (Signed)
 OUTPATIENT PHYSICAL THERAPY NEURO TREATMENT   Patient Name: Amy Barry MRN: [id number] DOB:10-06-1970, 53 y.o., female Today's Date: 11/14/2023   PCP: Sun, Vyvyan, MD REFERRING PROVIDER: Skeet Juliene SAUNDERS, DO  END OF SESSION:  PT End of Session - 11/14/23 0849     Visit Number 6    Number of Visits 13   Plus eval   Date for PT Re-Evaluation 12/08/23    Authorization Type [employer]    Authorization Time Period Auth requested on 7/18    PT Start Time 0847    PT Stop Time 0932    PT Time Calculation (min) 45 min    Activity Tolerance Patient tolerated treatment well    Behavior During Therapy [facility] for tasks assessed/performed;Lability              Past Medical History:  Diagnosis Date   Anxiety    Arthralgia    Generalized weakness    bilateral upper and lower extremities,  walks with cane   GERD (gastroesophageal reflux disease)    Headache(784.0)    Heart murmur    per pt since childhood, no problems   History of kidney stones    Hypertension    followed pcp   IBS (irritable bowel syndrome)    Iron deficiency anemia    12-06-2017 per pt recently had IV iron infusion July 2019   Low vitamin D level    Osteoarthritis    pt unsure about this   PVC (premature ventricular contraction)    per pt had holter monitor   SLE (systemic lupus erythematosus) (HCC)    rheumotologist-- dr syed   Stroke [name]) 06/2020   Wears glasses    Past Surgical History:  Procedure Laterality Date   CERVICAL DISC ARTHROPLASTY N/A 10/23/2015   Procedure: Cervical Disc Arthroplasty Cervical six-seven;  Surgeon: Alm GORMAN Molt, MD;  Location: [facility] NEURO ORS;  Service: Neurosurgery;  Laterality: N/A;   D & C HYSTEROSCOPY W/ RESECTION ENDOMETRIAL POLYP  10-13-2003    dr rivard @[facility]    ESOPHAGOGASTRODUODENOSCOPY     for GERD   IR ANGIO INTRA EXTRACRAN SEL COM CAROTID INNOMINATE BILAT MOD SED  07/06/2020   IR ANGIO INTRA EXTRACRAN SEL INTERNAL CAROTID BILAT MOD SED  06/29/2020   IR ANGIO VERTEBRAL SEL  VERTEBRAL BILAT MOD SED  06/29/2020   IR ANGIO VERTEBRAL SEL VERTEBRAL BILAT MOD SED  07/06/2020   LAPAROSCOPIC ABDOMINAL EXPLORATION  1996   dx ibs   OVARIAN CYST SURGERY  age 72   x2 cyst   RADIOLOGY WITH ANESTHESIA N/A 06/29/2020   Procedure: IR WITH ANESTHESIA;  Surgeon: Radiologist, Medication, MD;  Location: [facility] OR;  Service: Radiology;  Laterality: N/A;   ROBOTIC ASSISTED TOTAL HYSTERECTOMY N/A 12/13/2017   Procedure: XI ROBOTIC ASSISTED TOTAL HYSTERECTOMYWITH BILATERAL SALPINGECTOMY;  Surgeon: Rosalva Sawyer, MD;  Location: [facility] ORS;  Service: Gynecology;  Laterality: N/A;   TUBAL LIGATION Bilateral 06-01-2002   dr henry @[facility]    PPTL   Patient Active Problem List   Diagnosis Date Noted   Migraine 04/19/2023   Generalized anxiety disorder 05/05/2021   Biceps tendonitis on left 02/10/2021   Vascular headache 08/05/2020   Hyponatremia 07/28/2020   Headache 07/24/2020   Neck pain, musculoskeletal 07/24/2020   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    Subarachnoid bleed (HCC) 07/09/2020   Subarachnoid hemorrhage (HCC) 06/29/2020   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HCC) 03/04/2019   S/P hysterectomy 12/13/2017   Abnormal laboratory test 09/09/2016  Gait abnormality 08/03/2016   Chronic fatigue 08/03/2016   Paresthesia 08/03/2016   S/P cervical spinal fusion 10/23/2015   Hypersomnia 12/08/2010   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   EPIGASTRIC PAIN 10/13/2009   IRRITABLE BOWEL SYNDROME 03/10/2009   BACK PAIN, LUMBAR 07/01/2008   COSTOCHONDRITIS 02/01/2008   VIRAL URI 01/08/2008   CHEST PAIN 01/08/2008   OTHER SPECIFIED EPISODIC MOOD DISORDER 12/25/2007   ARM PAIN 12/25/2007   ANEMIA-NOS 10/02/2007   Essential hypertension 10/02/2007   GERD 10/02/2007   POLYP, GALLBLADDER 10/02/2007   UTERINE POLYP 10/02/2007   OSTEOARTHRITIS 10/02/2007   Dyskinesia 10/02/2007   HEADACHE 10/02/2007   PALPITATIONS 10/02/2007   CARDIAC MURMUR, HX OF 10/02/2007   NEPHROLITHIASIS, HX OF  10/02/2007    ONSET DATE: 10/11/2023 (referral)   REFERRING DIAG: M54.2 (ICD-10-CM) - Neck pain  THERAPY DIAG:  Cervicalgia  Muscle weakness (generalized)  Abnormal posture  Rationale for Evaluation and Treatment: Rehabilitation  SUBJECTIVE:                                                                                                                                                                                             SUBJECTIVE STATEMENT:  Pt presents holding her new theracane. Pt reports that she had a bad pain weekend. Felt fine after PT session Friday but had a lot of pain on Saturday and a headache on Sunday. Worked on her HEP some as she could tolerate. Pain has eased off now.   Pt accompanied by: [name]   PERTINENT HISTORY: anxiety, GERD, headache, HTN, SLE, CVA and aneurysm with resultant dysarthria (2022),  PAIN:  Are you having pain? Yes: NPRS scale: 5/10  Pain location: R upper trap, Neck  Pain description: Achy, sharp, throbbing  Aggravating factors: Catatonic episodes, anxiety  Relieving factors: None  PRECAUTIONS: Fall  RED FLAGS: None   WEIGHT BEARING RESTRICTIONS: No  FALLS: Has patient fallen in last 6 months? No   PLOF: Needs assistance with ADLs, Needs assistance with homemaking, Needs assistance with gait, and Needs assistance with transfers  PATIENT GOALS: Unable to assess on eval due to emotions and aphasia   OBJECTIVE:  Note: Objective measures were completed at Evaluation unless otherwise noted.  DIAGNOSTIC FINDINGS: From Dr. Jayme note on 10/11/23:  10/27/2011 MRI CERVICAL SPINE WO:  Tiny central disc bulges from C2-3 through C4-5 with no significant neural impingement. 06/19/2012 CTA HEAD:  Negative 09/12/2015 MRI CERVICAL SPINE WO:  Progressed cervical spondylosis and degenerative disc disease with left foraminal disc protrusion and nerve impingement at C6-7, moderate impingement at C3-4 and C4-5 and mild impingement at  C5-6. 08/03/2016 MRI BRAIN  WO:  Unremarkable 08/03/2016 MRI CERVICAL SPINE WO:  Status post arthrodesis at C6-7 with questionable posterior migration of C6-7 implant, mild to moderate canal stenosis with mild cord flattening without definite cord signal changes; moderate degenerative spondylosis with disc protrusions at C3-4, C4-5, and C5-6. 08/04/2016 MRI CERVICAL SPINE WO:  C6-7 arthrodesis now appears in place. 10/06/2016 MRI LUMBAR SPINE WO:  Degenerative disc disease and mild bilateral foraminal narrowing at L5-S1 without significant compression; mild ligamentum flavum hypertrophy and facet degenerative facet changes at L3-4 and L4-5 without compression. 03/04/2019 X-RAY BILATERAL HIP/PELVIS:  Mild degenerative changes both hips.  Otherwise, unremarkable. 05/18/2019 MRI BRAIN W WO:  Normal exam.  No change since 2018. 05/18/2019 MRI C-SPINE W WO:  C3-4: Chronic central disc herniation effaces the ventral subarachnoid space and indents the ventral cord. AP diameter of the canal in the midline only 6.5 mm. Similar appearance to the study of 2018, possibly with minimal enlargement of the disc. C4-5: Chronic central disc herniation effaces the ventral subarachnoid space and indents the ventral cord. AP diameter of the canal in the midline is 8 mm. Similar appearance to the study of 2018, possibly with minimal enlargement of the disc.  C5-6: Mild noncompressive disc bulge.  C6-7: Previous disc arthroplasty. Extensive artifact precludes MR evaluation of the canal or foramina at this level. 06/21/2019 MRI T-SPINE W WO:  Normal thoracic spine MRI. No finding to explain the patient's symptoms. 06/21/2019 MRI L-SPINE W WO:  Mild degenerative disc disease L3-4 and L5-S1 without central canal or foraminal stenosis. The examination is otherwise negative. No finding to explain the patient's symptoms. 06/29/2020 CT HEAD:  Subarachnoid hemorrhage in the posterior fossa with aneurysmal pattern. There is  intraventricular reflux and ventriculomegaly. 06/29/2020 CTA HEAD &  NECK:  No definite source of subarachnoid hemorrhage but there poor visualization of the proximal left PICA with a tiny bulbous area, question ruptured dissection. 07/06/2020 CEREBRAL ANGIOGRAM:  1. No intracranial aneurysms, arteriovenous malformations, or  fistulas are identified. 2. Mild, nonflow limiting vasospasm involving the supraclinoid  segment of the internal carotid arteries bilaterally.  11/25/2020 MRA HEAD WO:  1. Question 2 mm saccular outpouching extending medially and inferiorly from the paraophthalmic left ICA. Finding is indeterminate, but could reflect a small paraophthalmic aneurysm. This is not definitely seen on previous exams. 2. Otherwise normal intracranial MRA. 08/27/2023 MRI BRAIN WO:  1. No acute intracranial abnormality. 2. 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  COGNITION: Overall cognitive status: Impaired   SENSATION: Not tested    POSTURE: rounded shoulders, forward head, and increased thoracic kyphosis   LOWER EXTREMITY MMT:    MMT Right Eval Left Eval  Hip flexion    Hip extension    Hip abduction    Hip adduction    Hip internal rotation    Hip external rotation    Knee flexion    Knee extension    Ankle dorsiflexion    Ankle plantarflexion    Ankle inversion    Ankle eversion    (Blank rows = not tested)  CERVICAL ROM: tested in seated position  Active ROM A/PROM (deg) eval  Flexion 31  Extension 30  Right lateral flexion 34  Left lateral flexion 47  Right rotation 67  Left rotation 40   (Blank rows = not tested)   PALPATION: TTP along R upper trap, R suboccipitals, R rhomboids and bilateral semispinalis capitis   TRANSFERS: Sit to stand: Modified independence  Assistive device utilized: None     Stand to sit: Modified independence  Assistive device utilized: None      RAMP:   Not tested  CURB:  Not tested  STAIRS: Not tested  GAIT: Gait pattern: WFL Distance walked: Various clinic distances  Assistive device utilized: None Level of assistance: Modified independence Comments: No instability noted    PATIENT SURVEYS:  NDI: to be assessed    VITALS  There were no vitals filed for this visit.                                                                                                                                TREATMENT:   TherAct/Ex  Pt performed upper trap trigger point release w/her theracane to practice using it as it is slightly different from clinic's, x5 minutes. Educated pt on using heating pad prior to theracane to help relax the area and can perform her stretches while using the theracane for added relief.  The following were performed for improved scapular retraction strength, core stability, spinal alignment and reduced pain:  Standing upright rows w/red resistance band anchored to ballet bar, x25 reps. Max multimodal cues required initially for proper form and facilitation of scapular retraction. Added to HEP (see bolded below)  Modified bird dogs at ballet bar, x8 reps per side. Min cues to maintain alt sequence, especially w/fatigue.  Supine MET to SIJ using dowel, 5 x6s holds per side, as pt reports feeling lopsided and having pain w/standing rows. Assessed pelvis and noted anterior rotation of R innominate. Following MET, pt's pelvis level, which made pt very tearful. Pt reported pain as 3/10 following MET.   NuStep level 2.0 for 6.5 minutes using BUE/BLEs for neural priming for reciprocal movement, dynamic cardiovascular conditioning and thoracic rotation mobility. Pt reported feeling good stretch w/activity. RPE of 6/10 following activity   PATIENT EDUCATION: Education details: updates to HEP, proper use of theracane  Person educated: Patient Education method: Explanation, Demonstration, Verbal cues, and Handouts Education  comprehension: verbalized understanding, returned demonstration, verbal cues required, and needs further education  HOME EXERCISE PROGRAM: Access Code: [email or web address] URL: [email or web address] Date: 10/26/2023 Prepared by: Taylor Turkalo  Exercises - Seated Upper Trapezius Stretch  - 1 x daily - 7 x weekly - 1 sets - 3-5 reps - 30 sec hold - Gentle Levator Scapulae Stretch  - 1 x daily - 7 x weekly - 1 sets - 3-5 reps - 30 sec hold - Supine Suboccipital Release with Tennis Balls  - 1 x daily - 7 x weekly - 1 sets - 1 reps - 5 min hold - Sternocleidomastoid Stretch  - 1 x daily - 7 x weekly - 1 sets - 3-5 reps - 30 sec hold - Sternocleidomastoid Release  - 1 x daily - 7 x weekly - 1 sets - 1 reps - 5 min hold - Supine Shoulder External Rotation Stretch  - 1 x daily -  7 x weekly - 1 sets - 10 reps - 5-10 sec hold - Supine Thoracic Mobilization Towel Roll Vertical with Arm Stretch  - 1 x daily - 7 x weekly - 3-5 minutes  hold - Hooklying Upper Neck Extension   - 1 x daily - 7 x weekly - 3 sets - 10 reps - Supine Cervical Retraction with Towel  - 1 x daily - 7 x weekly - 3 sets - 10 reps - Standing Bilateral Low Shoulder Row with Anchored Resistance  - 1 x daily - 7 x weekly - 3 sets - 10 reps - 90/90 SI Joint Self-Correction with Dowel  - 1 x daily - 7 x weekly - 1 sets - 5-7 reps - 5 second hold  GOALS: Goals reviewed with patient? Yes  SHORT TERM GOALS: Target date: 11/17/2023   Pt will be independent with initial HEP for improved strength and cervical ROM.  Baseline: not established on eval  Goal status: INITIAL  2.  NDI to be assessed and LTG updated  Baseline: 16/45 (7/24) Goal status: MET  3.  Pt will improve cervical flexion A/ROM to >/= 45 degrees for improved functional use of C-spine and reduced pain  Baseline:  Active ROM A/PROM (deg) eval  Flexion 31  Extension 30  Right lateral flexion 34  Left lateral flexion 47  Right rotation 67  Left rotation 40    (Blank rows = not tested)  Goal status: INITIAL  4.  Pt will improved L cervical rotation A/ROM to >/= 50 degrees for reduced pain and improved quality of life  Baseline:  Active ROM A/PROM (deg) eval  Flexion 31  Extension 30  Right lateral flexion 34  Left lateral flexion 47  Right rotation 67  Left rotation 40   (Blank rows = not tested)  Goal status: INITIAL   LONG TERM GOALS: Target date: 12/01/2023  Pt will improve her score on the NDI to </= 11/45 to demonstrate improved function and decreased disability level Baseline: 16/45 (7/24), moderate disability Goal status: INITIAL  2.  Pt will improved R lateral flexion cervical A/ROM to >/= 45 degrees for improved functional use of C-spine and reduced pain  Baseline:  Active ROM A/PROM (deg) eval  Flexion 31  Extension 30  Right lateral flexion 34  Left lateral flexion 47  Right rotation 67  Left rotation 40   (Blank rows = not tested)  Goal status: INITIAL  3.  Pt will be independent with final HEP for improved strength and cervical ROM.  Baseline:  Goal status: INITIAL   ASSESSMENT:  CLINICAL IMPRESSION: Emphasis of skilled PT session on improved periscapular strength, postural control and proper spinal alignment for reduced pain levels. Pt reported 5/10 pain at onset of session but did reduce to 3/10 by end of session. Pt reported low back pain during standing rows and upon assessment noted anterior rotation of R innominate. Following MET to SIJ, pt's pelvis more aligned and pt tearful due to reduction of pain when standing. Pt demonstrates poor scapular retraction strength which is likely contributing to her neck/shoulder pain so will continue to assess in PT. Continue POC.    OBJECTIVE IMPAIRMENTS: decreased cognition, decreased knowledge of condition, decreased mobility, decreased ROM, decreased strength, impaired perceived functional ability, impaired flexibility, impaired UE functional use, improper body  mechanics, postural dysfunction, and pain  ACTIVITY LIMITATIONS: carrying, lifting, sleeping, transfers, reach over head, locomotion level, and caring for others  PARTICIPATION LIMITATIONS: meal prep, cleaning, laundry, medication  management, interpersonal relationship, driving, shopping, community activity, occupation, and yard work  PERSONAL FACTORS: Behavior pattern, Past/current experiences, Time since onset of injury/illness/exacerbation, and 1-2 comorbidities: hx of SAH w/unknown etiology, expressive aphasia are also affecting patient's functional outcome.   REHAB POTENTIAL: Good  CLINICAL DECISION MAKING: Unstable/unpredictable  EVALUATION COMPLEXITY: High  PLAN:  PT FREQUENCY: 2x/week  PT DURATION: 6 weeks  PLANNED INTERVENTIONS: 02835- PT Re-evaluation, 97750- Physical Performance Testing, 97110-Therapeutic exercises, 97530- Therapeutic activity, [id number]- Neuromuscular re-education, 97535- Self Care, 02859- Manual therapy, [id number]- Gait training, [phone]- Aquatic Therapy, [phone] (1-2 muscles), [id number] (3+ muscles)- Dry Needling, Patient/Family education, Balance training, Joint mobilization, Spinal mobilization, and Vestibular training  PLAN FOR NEXT SESSION: Check STGs. TPDN to R upper traps, suboccipitals, semispinalis. Add to HEP for gentle upper trap stretch and cervical ROM and strengthening, Watch vitals, how is R pec and shoulder doing?   Humaira Sculley E Divya Munshi, PT, DPT  11/14/2023, 9:32 AM

## 2023-11-16 ENCOUNTER — Ambulatory Visit: Admitting: Physical Therapy

## 2023-11-16 DIAGNOSIS — R2689 Other abnormalities of gait and mobility: Secondary | ICD-10-CM | POA: Diagnosis not present

## 2023-11-16 DIAGNOSIS — M542 Cervicalgia: Secondary | ICD-10-CM

## 2023-11-16 DIAGNOSIS — M6281 Muscle weakness (generalized): Secondary | ICD-10-CM

## 2023-11-16 DIAGNOSIS — R293 Abnormal posture: Secondary | ICD-10-CM

## 2023-11-16 NOTE — Therapy (Signed)
 OUTPATIENT PHYSICAL THERAPY NEURO TREATMENT   Patient Name: Amy Barry MRN: [id number] DOB:21-Dec-1970, 53 y.o., female Today's Date: 11/16/2023   PCP: Sun, Vyvyan, MD REFERRING PROVIDER: Skeet Juliene SAUNDERS, DO  END OF SESSION:  PT End of Session - 11/16/23 0850     Visit Number 7    Number of Visits 13   Plus eval   Date for PT Re-Evaluation 12/08/23    Authorization Type [employer]    Authorization Time Period 10/20/23 - 12/01/23 13 PT visits    PT Start Time 0848    PT Stop Time 0928    PT Time Calculation (min) 40 min    Activity Tolerance Patient tolerated treatment well    Behavior During Therapy WFL for tasks assessed/performed              Past Medical History:  Diagnosis Date   Anxiety    Arthralgia    Generalized weakness    bilateral upper and lower extremities,  walks with cane   GERD (gastroesophageal reflux disease)    Headache(784.0)    Heart murmur    per pt since childhood, no problems   History of kidney stones    Hypertension    followed pcp   IBS (irritable bowel syndrome)    Iron deficiency anemia    12-06-2017 per pt recently had IV iron infusion July 2019   Low vitamin D level    Osteoarthritis    pt unsure about this   PVC (premature ventricular contraction)    per pt had holter monitor   SLE (systemic lupus erythematosus) (HCC)    rheumotologist-- dr syed   Stroke (HCC) 06/2020   Wears glasses    Past Surgical History:  Procedure Laterality Date   CERVICAL DISC ARTHROPLASTY N/A 10/23/2015   Procedure: Cervical Disc Arthroplasty Cervical six-seven;  Surgeon: Alm GORMAN Molt, MD;  Location: [facility];  Service: Neurosurgery;  Laterality: N/A;   D & C HYSTEROSCOPY W/ RESECTION ENDOMETRIAL POLYP  10-13-2003    dr rivard @[facility]    ESOPHAGOGASTRODUODENOSCOPY     for GERD   IR ANGIO INTRA EXTRACRAN SEL COM CAROTID INNOMINATE BILAT MOD SED  07/06/2020   IR ANGIO INTRA EXTRACRAN SEL INTERNAL CAROTID BILAT MOD SED  06/29/2020   IR ANGIO VERTEBRAL SEL  VERTEBRAL BILAT MOD SED  06/29/2020   IR ANGIO VERTEBRAL SEL VERTEBRAL BILAT MOD SED  07/06/2020   LAPAROSCOPIC ABDOMINAL EXPLORATION  1996   dx ibs   OVARIAN CYST SURGERY  age 2   x2 cyst   RADIOLOGY WITH ANESTHESIA N/A 06/29/2020   Procedure: IR WITH ANESTHESIA;  Surgeon: Radiologist, Medication, MD;  Location: [facility];  Service: Radiology;  Laterality: N/A;   ROBOTIC ASSISTED TOTAL HYSTERECTOMY N/A 12/13/2017   Procedure: XI ROBOTIC ASSISTED TOTAL HYSTERECTOMYWITH BILATERAL SALPINGECTOMY;  Surgeon: Rosalva Sawyer, MD;  Location: [facility];  Service: Gynecology;  Laterality: N/A;   TUBAL LIGATION Bilateral 06-01-2002   dr henry @[facility]    PPTL   Patient Active Problem List   Diagnosis Date Noted   Migraine 04/19/2023   Generalized anxiety disorder 05/05/2021   Biceps tendonitis on left 02/10/2021   Vascular headache 08/05/2020   Hyponatremia 07/28/2020   Headache 07/24/2020   Neck pain, musculoskeletal 07/24/2020   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    Subarachnoid bleed (HCC) 07/09/2020   Subarachnoid hemorrhage (HCC) 06/29/2020   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HCC) 03/04/2019   S/P hysterectomy 12/13/2017   Abnormal laboratory test 09/09/2016  Gait abnormality 08/03/2016   Chronic fatigue 08/03/2016   Paresthesia 08/03/2016   S/P cervical spinal fusion 10/23/2015   Hypersomnia 12/08/2010   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   EPIGASTRIC PAIN 10/13/2009   IRRITABLE BOWEL SYNDROME 03/10/2009   BACK PAIN, LUMBAR 07/01/2008   COSTOCHONDRITIS 02/01/2008   VIRAL URI 01/08/2008   CHEST PAIN 01/08/2008   OTHER SPECIFIED EPISODIC MOOD DISORDER 12/25/2007   ARM PAIN 12/25/2007   ANEMIA-NOS 10/02/2007   Essential hypertension 10/02/2007   GERD 10/02/2007   POLYP, GALLBLADDER 10/02/2007   UTERINE POLYP 10/02/2007   OSTEOARTHRITIS 10/02/2007   Dyskinesia 10/02/2007   HEADACHE 10/02/2007   PALPITATIONS 10/02/2007   CARDIAC MURMUR, HX OF 10/02/2007   NEPHROLITHIASIS, HX OF  10/02/2007    ONSET DATE: 10/11/2023 (referral)   REFERRING DIAG: M54.2 (ICD-10-CM) - Neck pain  THERAPY DIAG:  Cervicalgia  Muscle weakness (generalized)  Abnormal posture  Other abnormalities of gait and mobility  Rationale for Evaluation and Treatment: Rehabilitation  SUBJECTIVE:                                                                                                                                                                                             SUBJECTIVE STATEMENT:  Pt presents with daughter. States she felt good yesterday and after last therapy session but then woke up this morning with more pain. HEP is going okay, does them when she can.   Pt accompanied by: Daughter   PERTINENT HISTORY: anxiety, GERD, headache, HTN, SLE, CVA and aneurysm with resultant dysarthria (2022),  PAIN:  Are you having pain? Yes: NPRS scale: 7/10  Pain location: R upper trap, Neck  Pain description: Achy, sharp, throbbing  Aggravating factors: Catatonic episodes, anxiety  Relieving factors: None  PRECAUTIONS: Fall  RED FLAGS: None   WEIGHT BEARING RESTRICTIONS: No  FALLS: Has patient fallen in last 6 months? No   PLOF: Needs assistance with ADLs, Needs assistance with homemaking, Needs assistance with gait, and Needs assistance with transfers  PATIENT GOALS: Unable to assess on eval due to emotions and aphasia   OBJECTIVE:  Note: Objective measures were completed at Evaluation unless otherwise noted.  DIAGNOSTIC FINDINGS: From Dr. Jayme note on 10/11/23:  10/27/2011 MRI CERVICAL SPINE WO:  Tiny central disc bulges from C2-3 through C4-5 with no significant neural impingement. 06/19/2012 CTA HEAD:  Negative 09/12/2015 MRI CERVICAL SPINE WO:  Progressed cervical spondylosis and degenerative disc disease with left foraminal disc protrusion and nerve impingement at C6-7, moderate impingement at C3-4 and C4-5 and mild impingement at C5-6. 08/03/2016 MRI BRAIN  WO:  Unremarkable 08/03/2016 MRI CERVICAL SPINE WO:  Status  post arthrodesis at C6-7 with questionable posterior migration of C6-7 implant, mild to moderate canal stenosis with mild cord flattening without definite cord signal changes; moderate degenerative spondylosis with disc protrusions at C3-4, C4-5, and C5-6. 08/04/2016 MRI CERVICAL SPINE WO:  C6-7 arthrodesis now appears in place. 10/06/2016 MRI LUMBAR SPINE WO:  Degenerative disc disease and mild bilateral foraminal narrowing at L5-S1 without significant compression; mild ligamentum flavum hypertrophy and facet degenerative facet changes at L3-4 and L4-5 without compression. 03/04/2019 X-RAY BILATERAL HIP/PELVIS:  Mild degenerative changes both hips.  Otherwise, unremarkable. 05/18/2019 MRI BRAIN W WO:  Normal exam.  No change since 2018. 05/18/2019 MRI C-SPINE W WO:  C3-4: Chronic central disc herniation effaces the ventral subarachnoid space and indents the ventral cord. AP diameter of the canal in the midline only 6.5 mm. Similar appearance to the study of 2018, possibly with minimal enlargement of the disc. C4-5: Chronic central disc herniation effaces the ventral subarachnoid space and indents the ventral cord. AP diameter of the canal in the midline is 8 mm. Similar appearance to the study of 2018, possibly with minimal enlargement of the disc.  C5-6: Mild noncompressive disc bulge.  C6-7: Previous disc arthroplasty. Extensive artifact precludes MR evaluation of the canal or foramina at this level. 06/21/2019 MRI T-SPINE W WO:  Normal thoracic spine MRI. No finding to explain the patient's symptoms. 06/21/2019 MRI L-SPINE W WO:  Mild degenerative disc disease L3-4 and L5-S1 without central canal or foraminal stenosis. The examination is otherwise negative. No finding to explain the patient's symptoms. 06/29/2020 CT HEAD:  Subarachnoid hemorrhage in the posterior fossa with aneurysmal pattern. There is intraventricular reflux and  ventriculomegaly. 06/29/2020 CTA HEAD &  NECK:  No definite source of subarachnoid hemorrhage but there poor visualization of the proximal left PICA with a tiny bulbous area, question ruptured dissection. 07/06/2020 CEREBRAL ANGIOGRAM:  1. No intracranial aneurysms, arteriovenous malformations, or  fistulas are identified. 2. Mild, nonflow limiting vasospasm involving the supraclinoid  segment of the internal carotid arteries bilaterally.  11/25/2020 MRA HEAD WO:  1. Question 2 mm saccular outpouching extending medially and inferiorly from the paraophthalmic left ICA. Finding is indeterminate, but could reflect a small paraophthalmic aneurysm. This is not definitely seen on previous exams. 2. Otherwise normal intracranial MRA. 08/27/2023 MRI BRAIN WO:  1. No acute intracranial abnormality. 2. 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  COGNITION: Overall cognitive status: Impaired   SENSATION: Not tested    POSTURE: rounded shoulders, forward head, and increased thoracic kyphosis   LOWER EXTREMITY MMT:    MMT Right Eval Left Eval  Hip flexion    Hip extension    Hip abduction    Hip adduction    Hip internal rotation    Hip external rotation    Knee flexion    Knee extension    Ankle dorsiflexion    Ankle plantarflexion    Ankle inversion    Ankle eversion    (Blank rows = not tested)  CERVICAL ROM: tested in seated position  Active ROM A/PROM (deg) eval  Flexion 31  Extension 30  Right lateral flexion 34  Left lateral flexion 47  Right rotation 67  Left rotation 40   (Blank rows = not tested)   PALPATION: TTP along R upper trap, R suboccipitals, R rhomboids and bilateral semispinalis capitis   TRANSFERS: Sit to stand: Modified independence  Assistive device utilized: None     Stand to  sit: Modified independence  Assistive device utilized: None      RAMP:  Not tested  CURB:  Not  tested  STAIRS: Not tested  GAIT: Gait pattern: WFL Distance walked: Various clinic distances  Assistive device utilized: None Level of assistance: Modified independence Comments: No instability noted    PATIENT SURVEYS:  NDI: to be assessed    VITALS  There were no vitals filed for this visit.                                                                                                                                TREATMENT:   TherAct SciFit multi-peaks level 6.5 for 8 minutes using BUE/BLEs for neural priming for reciprocal movement, dynamic cardiovascular warmup and improved thoracic ROM and shoulder mobility. Pt rated pain as 4/10 following activity  Shoulder shrugs with 5# KB, x10 reps on RUE for R upper trap strength and mobilization. Pt performed well for first 8 reps but reported discomfort in anterior shoulder on reps 9-10.  Standing scapular retraction at wall, x10 reps w/5s isometric hold, for improved anterior shoulder stretch and periscapular strength. Mod visual cues for proper technique to reduce shoulder shrug compensation.  On mat, child's pose (modified on forearms) for improved spinal and shoulder mobility. Pt performed well w/largest stretch reported in cervical spine.  Quadruped thread the needles, x5 reps per side for improved thoracic and shoulder mobility. Pt reported cramping in hamstrings w/quadruped position, but otherwise tolerated well. Pt moved very slowly and guarded, but no instability noted.  Pt rated pain as 5/10 following activity   PATIENT EDUCATION: Education details: Continue HEP  Person educated: Patient Education method: Programmer, multimedia, Demonstration, Verbal cues, and Handouts Education comprehension: verbalized understanding, returned demonstration, verbal cues required, and needs further education  HOME EXERCISE PROGRAM: Access Code: [email or web address] URL: [email or web address] Date: 10/26/2023 Prepared by: Taylor  Turkalo  Exercises - Seated Upper Trapezius Stretch  - 1 x daily - 7 x weekly - 1 sets - 3-5 reps - 30 sec hold - Gentle Levator Scapulae Stretch  - 1 x daily - 7 x weekly - 1 sets - 3-5 reps - 30 sec hold - Supine Suboccipital Release with Tennis Balls  - 1 x daily - 7 x weekly - 1 sets - 1 reps - 5 min hold - Sternocleidomastoid Stretch  - 1 x daily - 7 x weekly - 1 sets - 3-5 reps - 30 sec hold - Sternocleidomastoid Release  - 1 x daily - 7 x weekly - 1 sets - 1 reps - 5 min hold - Supine Shoulder External Rotation Stretch  - 1 x daily - 7 x weekly - 1 sets - 10 reps - 5-10 sec hold - Supine Thoracic Mobilization Towel Roll Vertical with Arm Stretch  - 1 x daily - 7 x weekly - 3-5 minutes  hold - Hooklying Upper Neck Extension   - 1 x daily - 7 x weekly -  3 sets - 10 reps - Supine Cervical Retraction with Towel  - 1 x daily - 7 x weekly - 3 sets - 10 reps - Standing Bilateral Low Shoulder Row with Anchored Resistance  - 1 x daily - 7 x weekly - 3 sets - 10 reps - 90/90 SI Joint Self-Correction with Dowel  - 1 x daily - 7 x weekly - 1 sets - 5-7 reps - 5 second hold  GOALS: Goals reviewed with patient? Yes  SHORT TERM GOALS: Target date: 11/17/2023   Pt will be independent with initial HEP for improved strength and cervical ROM.  Baseline: not established on eval  Goal status: INITIAL  2.  NDI to be assessed and LTG updated  Baseline: 16/45 (7/24) Goal status: MET  3.  Pt will improve cervical flexion A/ROM to >/= 45 degrees for improved functional use of C-spine and reduced pain  Baseline:  Active ROM A/PROM (deg) eval  Flexion 31  Extension 30  Right lateral flexion 34  Left lateral flexion 47  Right rotation 67  Left rotation 40   (Blank rows = not tested)  Goal status: INITIAL  4.  Pt will improved L cervical rotation A/ROM to >/= 50 degrees for reduced pain and improved quality of life  Baseline:  Active ROM A/PROM (deg) eval  Flexion 31  Extension 30  Right  lateral flexion 34  Left lateral flexion 47  Right rotation 67  Left rotation 40   (Blank rows = not tested)  Goal status: INITIAL   LONG TERM GOALS: Target date: 12/01/2023  Pt will improve her score on the NDI to </= 11/45 to demonstrate improved function and decreased disability level Baseline: 16/45 (7/24), moderate disability Goal status: INITIAL  2.  Pt will improved R lateral flexion cervical A/ROM to >/= 45 degrees for improved functional use of C-spine and reduced pain  Baseline:  Active ROM A/PROM (deg) eval  Flexion 31  Extension 30  Right lateral flexion 34  Left lateral flexion 47  Right rotation 67  Left rotation 40   (Blank rows = not tested)  Goal status: INITIAL  3.  Pt will be independent with final HEP for improved strength and cervical ROM.  Baseline:  Goal status: INITIAL   ASSESSMENT:  CLINICAL IMPRESSION: Emphasis of skilled PT session on improved periscapular strength, spinal mobility and upper trap stretch on R side. Pt reported decrease in pain by end of session but continues to be limited by tightness in R upper trap and anterior deltoid. Pt challenged by quadruped position due to reduced mobility in lumbar spine and hamstrings, but tolerated well w/gentle stretching. Continue POC.    OBJECTIVE IMPAIRMENTS: decreased cognition, decreased knowledge of condition, decreased mobility, decreased ROM, decreased strength, impaired perceived functional ability, impaired flexibility, impaired UE functional use, improper body mechanics, postural dysfunction, and pain  ACTIVITY LIMITATIONS: carrying, lifting, sleeping, transfers, reach over head, locomotion level, and caring for others  PARTICIPATION LIMITATIONS: meal prep, cleaning, laundry, medication management, interpersonal relationship, driving, shopping, community activity, occupation, and yard work  PERSONAL FACTORS: Behavior pattern, Past/current experiences, Time since onset of  injury/illness/exacerbation, and 1-2 comorbidities: hx of SAH w/unknown etiology, expressive aphasia are also affecting patient's functional outcome.   REHAB POTENTIAL: Good  CLINICAL DECISION MAKING: Unstable/unpredictable  EVALUATION COMPLEXITY: High  PLAN:  PT FREQUENCY: 2x/week  PT DURATION: 6 weeks  PLANNED INTERVENTIONS: 97164- PT Re-evaluation, 97750- Physical Performance Testing, 97110-Therapeutic exercises, 97530- Therapeutic activity, [id number]- Neuromuscular re-education, 97535- Self Care,  02859- Manual therapy, [id number]- Gait training, [id number]- Aquatic Therapy, [id number] (1-2 muscles), 20561 (3+ muscles)- Dry Needling, Patient/Family education, Balance training, Joint mobilization, Spinal mobilization, and Vestibular training  PLAN FOR NEXT SESSION: Check STGs. TPDN to R upper traps, suboccipitals, semispinalis. Add to HEP for gentle upper trap stretch and cervical ROM and strengthening, Watch vitals, how is R pec and shoulder doing?   Lux Skilton E Haile Bosler, PT, DPT  11/16/2023, 9:49 AM

## 2023-11-21 ENCOUNTER — Ambulatory Visit: Admitting: Physical Therapy

## 2023-11-21 DIAGNOSIS — M542 Cervicalgia: Secondary | ICD-10-CM

## 2023-11-21 DIAGNOSIS — M6281 Muscle weakness (generalized): Secondary | ICD-10-CM

## 2023-11-21 DIAGNOSIS — R293 Abnormal posture: Secondary | ICD-10-CM

## 2023-11-21 DIAGNOSIS — R2689 Other abnormalities of gait and mobility: Secondary | ICD-10-CM | POA: Diagnosis not present

## 2023-11-21 NOTE — Therapy (Signed)
 OUTPATIENT PHYSICAL THERAPY NEURO TREATMENT   Patient Name: Amy Barry MRN: [id number] DOB:1970-05-22, 53 y.o., female Today's Date: 11/21/2023   PCP: Sun, Vyvyan, MD REFERRING PROVIDER: Skeet Juliene SAUNDERS, DO  END OF SESSION:  PT End of Session - 11/21/23 0850     Visit Number 8    Number of Visits 13   Plus eval   Date for PT Re-Evaluation 12/08/23    Authorization Type [employer]    Authorization Time Period 10/20/23 - 12/01/23 13 PT visits    PT Start Time 0848    PT Stop Time 0904   Pt requesting to leave early due to frustration w/speech   PT Time Calculation (min) 16 min    Activity Tolerance Other (comment)   Pt unable to speak well, became frustrated and tearful, husband reporting he was going to take pt home   Behavior During Therapy Lability               Past Medical History:  Diagnosis Date   Anxiety    Arthralgia    Generalized weakness    bilateral upper and lower extremities,  walks with cane   GERD (gastroesophageal reflux disease)    Headache(784.0)    Heart murmur    per pt since childhood, no problems   History of kidney stones    Hypertension    followed pcp   IBS (irritable bowel syndrome)    Iron deficiency anemia    12-06-2017 per pt recently had IV iron infusion July 2019   Low vitamin D level    Osteoarthritis    pt unsure about this   PVC (premature ventricular contraction)    per pt had holter monitor   SLE (systemic lupus erythematosus) (HCC)    rheumotologist-- dr syed   Stroke [facility]) 06/2020   Wears glasses    Past Surgical History:  Procedure Laterality Date   CERVICAL DISC ARTHROPLASTY N/A 10/23/2015   Procedure: Cervical Disc Arthroplasty Cervical six-seven;  Surgeon: Alm GORMAN Molt, MD;  Location: [facility];  Service: Neurosurgery;  Laterality: N/A;   D & C HYSTEROSCOPY W/ RESECTION ENDOMETRIAL POLYP  10-13-2003    dr rivard @[facility]    ESOPHAGOGASTRODUODENOSCOPY     for GERD   IR ANGIO INTRA EXTRACRAN SEL COM CAROTID INNOMINATE  BILAT MOD SED  07/06/2020   IR ANGIO INTRA EXTRACRAN SEL INTERNAL CAROTID BILAT MOD SED  06/29/2020   IR ANGIO VERTEBRAL SEL VERTEBRAL BILAT MOD SED  06/29/2020   IR ANGIO VERTEBRAL SEL VERTEBRAL BILAT MOD SED  07/06/2020   LAPAROSCOPIC ABDOMINAL EXPLORATION  1996   dx ibs   OVARIAN CYST SURGERY  age 63   x2 cyst   RADIOLOGY WITH ANESTHESIA N/A 06/29/2020   Procedure: IR WITH ANESTHESIA;  Surgeon: Radiologist, Medication, MD;  Location: [facility] OR;  Service: Radiology;  Laterality: N/A;   ROBOTIC ASSISTED TOTAL HYSTERECTOMY N/A 12/13/2017   Procedure: XI ROBOTIC ASSISTED TOTAL HYSTERECTOMYWITH BILATERAL SALPINGECTOMY;  Surgeon: Rosalva Sawyer, MD;  Location: [facility];  Service: Gynecology;  Laterality: N/A;   TUBAL LIGATION Bilateral 06-01-2002   dr henry @[facility]    PPTL   Patient Active Problem List   Diagnosis Date Noted   Migraine 04/19/2023   Generalized anxiety disorder 05/05/2021   Biceps tendonitis on left 02/10/2021   Vascular headache 08/05/2020   Hyponatremia 07/28/2020   Headache 07/24/2020   Neck pain, musculoskeletal 07/24/2020   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    Subarachnoid  bleed (HCC) 07/09/2020   Subarachnoid hemorrhage (HCC) 06/29/2020   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HCC) 03/04/2019   S/P hysterectomy 12/13/2017   Abnormal laboratory test 09/09/2016   Gait abnormality 08/03/2016   Chronic fatigue 08/03/2016   Paresthesia 08/03/2016   S/P cervical spinal fusion 10/23/2015   Hypersomnia 12/08/2010   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   EPIGASTRIC PAIN 10/13/2009   IRRITABLE BOWEL SYNDROME 03/10/2009   BACK PAIN, LUMBAR 07/01/2008   COSTOCHONDRITIS 02/01/2008   VIRAL URI 01/08/2008   CHEST PAIN 01/08/2008   OTHER SPECIFIED EPISODIC MOOD DISORDER 12/25/2007   ARM PAIN 12/25/2007   ANEMIA-NOS 10/02/2007   Essential hypertension 10/02/2007   GERD 10/02/2007   POLYP, GALLBLADDER 10/02/2007   UTERINE POLYP 10/02/2007   OSTEOARTHRITIS 10/02/2007   Dyskinesia  10/02/2007   HEADACHE 10/02/2007   PALPITATIONS 10/02/2007   CARDIAC MURMUR, HX OF 10/02/2007   NEPHROLITHIASIS, HX OF 10/02/2007    ONSET DATE: 10/11/2023 (referral)   REFERRING DIAG: M54.2 (ICD-10-CM) - Neck pain  THERAPY DIAG:  Muscle weakness (generalized)  Abnormal posture  Cervicalgia  Rationale for Evaluation and Treatment: Rehabilitation  SUBJECTIVE:                                                                                                                                                                                             SUBJECTIVE STATEMENT:  Pt presents with husband. States her shoulder is feeling better. Vacuumed for about 30 minutes over the weekend and it really helped her shoulder. Now feels like her L shoulder is more tight than the R.   Pt accompanied by: [name]   PERTINENT HISTORY: anxiety, GERD, headache, HTN, SLE, CVA and aneurysm with resultant dysarthria (2022),  PAIN:  Are you having pain? Yes: NPRS scale: 3/10  Pain location: R upper trap, Neck  Pain description: Achy, sharp, throbbing  Aggravating factors: Catatonic episodes, anxiety  Relieving factors: None  PRECAUTIONS: Fall  RED FLAGS: None   WEIGHT BEARING RESTRICTIONS: No  FALLS: Has patient fallen in last 6 months? No   PLOF: Needs assistance with ADLs, Needs assistance with homemaking, Needs assistance with gait, and Needs assistance with transfers  PATIENT GOALS: Unable to assess on eval due to emotions and aphasia   OBJECTIVE:  Note: Objective measures were completed at Evaluation unless otherwise noted.  DIAGNOSTIC FINDINGS: From Dr. Jayme note on 10/11/23:  10/27/2011 MRI CERVICAL SPINE WO:  Tiny central disc bulges from C2-3 through C4-5 with no significant neural impingement. 06/19/2012 CTA HEAD:  Negative 09/12/2015 MRI CERVICAL SPINE WO:  Progressed cervical spondylosis and degenerative disc disease with left foraminal disc protrusion and nerve  impingement at  C6-7, moderate impingement at C3-4 and C4-5 and mild impingement at C5-6. 08/03/2016 MRI BRAIN WO:  Unremarkable 08/03/2016 MRI CERVICAL SPINE WO:  Status post arthrodesis at C6-7 with questionable posterior migration of C6-7 implant, mild to moderate canal stenosis with mild cord flattening without definite cord signal changes; moderate degenerative spondylosis with disc protrusions at C3-4, C4-5, and C5-6. 08/04/2016 MRI CERVICAL SPINE WO:  C6-7 arthrodesis now appears in place. 10/06/2016 MRI LUMBAR SPINE WO:  Degenerative disc disease and mild bilateral foraminal narrowing at L5-S1 without significant compression; mild ligamentum flavum hypertrophy and facet degenerative facet changes at L3-4 and L4-5 without compression. 03/04/2019 X-RAY BILATERAL HIP/PELVIS:  Mild degenerative changes both hips.  Otherwise, unremarkable. 05/18/2019 MRI BRAIN W WO:  Normal exam.  No change since 2018. 05/18/2019 MRI C-SPINE W WO:  C3-4: Chronic central disc herniation effaces the ventral subarachnoid space and indents the ventral cord. AP diameter of the canal in the midline only 6.5 mm. Similar appearance to the study of 2018, possibly with minimal enlargement of the disc. C4-5: Chronic central disc herniation effaces the ventral subarachnoid space and indents the ventral cord. AP diameter of the canal in the midline is 8 mm. Similar appearance to the study of 2018, possibly with minimal enlargement of the disc.  C5-6: Mild noncompressive disc bulge.  C6-7: Previous disc arthroplasty. Extensive artifact precludes MR evaluation of the canal or foramina at this level. 06/21/2019 MRI T-SPINE W WO:  Normal thoracic spine MRI. No finding to explain the patient's symptoms. 06/21/2019 MRI L-SPINE W WO:  Mild degenerative disc disease L3-4 and L5-S1 without central canal or foraminal stenosis. The examination is otherwise negative. No finding to explain the patient's symptoms. 06/29/2020 CT HEAD:  Subarachnoid  hemorrhage in the posterior fossa with aneurysmal pattern. There is intraventricular reflux and ventriculomegaly. 06/29/2020 CTA HEAD &  NECK:  No definite source of subarachnoid hemorrhage but there poor visualization of the proximal left PICA with a tiny bulbous area, question ruptured dissection. 07/06/2020 CEREBRAL ANGIOGRAM:  1. No intracranial aneurysms, arteriovenous malformations, or  fistulas are identified. 2. Mild, nonflow limiting vasospasm involving the supraclinoid  segment of the internal carotid arteries bilaterally.  11/25/2020 MRA HEAD WO:  1. Question 2 mm saccular outpouching extending medially and inferiorly from the paraophthalmic left ICA. Finding is indeterminate, but could reflect a small paraophthalmic aneurysm. This is not definitely seen on previous exams. 2. Otherwise normal intracranial MRA. 08/27/2023 MRI BRAIN WO:  1. No acute intracranial abnormality. 2. 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  COGNITION: Overall cognitive status: Impaired   SENSATION: Not tested    POSTURE: rounded shoulders, forward head, and increased thoracic kyphosis/   LOWER EXTREMITY MMT:    MMT Right Eval Left Eval  Hip flexion    Hip extension    Hip abduction    Hip adduction    Hip internal rotation    Hip external rotation    Knee flexion    Knee extension    Ankle dorsiflexion    Ankle plantarflexion    Ankle inversion    Ankle eversion    (Blank rows = not tested)  CERVICAL ROM: tested in seated position  Active ROM A/ROM (deg) eval A/ROM (deg) 11/21/23  Flexion 31   Extension 30   Right lateral flexion 34   Left lateral flexion 47   Right rotation 67   Left rotation 40    (Blank rows = not tested)  PALPATION: TTP along R upper trap, R suboccipitals, R rhomboids and bilateral semispinalis capitis   TRANSFERS: Sit to stand: Modified independence  Assistive device utilized:  None     Stand to sit: Modified independence  Assistive device utilized: None      RAMP:  Not tested  CURB:  Not tested  STAIRS: Not tested  GAIT: Gait pattern: WFL Distance walked: Various clinic distances  Assistive device utilized: None Level of assistance: Modified independence Comments: No instability noted    PATIENT SURVEYS:  NDI: to be assessed    VITALS  There were no vitals filed for this visit.                                                                                                                                TREATMENT:   [employer] Assessed pt's upper traps and levator scap and pt does demonstrate increased tightness on R side, but very minor and no pain reported w/palpation.  Pt attempting to tell therapist about difficulty reaching for things on her nightstand due to shoulder pain, but became frustrated with herself as she could not speak well and became tearful. Husband provided pep talk to pt to work through her emotions while therapist provided therapeutic listening as appropriate.  While setting up [employer] for pt, pt's husband reports he is going to take pt home as she is overstimulated and frustrated due to aphasia. Therapist verbalized understanding as pt repeatedly apologizing to therapist. Reminded pt of next appointment date and time.   PATIENT EDUCATION: Education details: Continue HEP  Person educated: Patient Education method: Explanation Education comprehension: verbalized understanding and needs further education  HOME EXERCISE PROGRAM: Access Code: [email or web address] URL: [email or web address] Date: 10/26/2023 Prepared by: Taylor Turkalo  Exercises - Seated Upper Trapezius Stretch  - 1 x daily - 7 x weekly - 1 sets - 3-5 reps - 30 sec hold - Gentle Levator Scapulae Stretch  - 1 x daily - 7 x weekly - 1 sets - 3-5 reps - 30 sec hold - Supine Suboccipital Release with Tennis Balls  - 1 x daily - 7 x weekly - 1 sets - 1 reps - 5  min hold - Sternocleidomastoid Stretch  - 1 x daily - 7 x weekly - 1 sets - 3-5 reps - 30 sec hold - Sternocleidomastoid Release  - 1 x daily - 7 x weekly - 1 sets - 1 reps - 5 min hold - Supine Shoulder External Rotation Stretch  - 1 x daily - 7 x weekly - 1 sets - 10 reps - 5-10 sec hold - Supine Thoracic Mobilization Towel Roll Vertical with Arm Stretch  - 1 x daily - 7 x weekly - 3-5 minutes  hold - Hooklying Upper Neck Extension   - 1 x daily - 7 x weekly - 3 sets - 10 reps - Supine Cervical Retraction with Towel  - 1 x daily - 7 x weekly -  3 sets - 10 reps - Standing Bilateral Low Shoulder Row with Anchored Resistance  - 1 x daily - 7 x weekly - 3 sets - 10 reps - 90/90 SI Joint Self-Correction with Dowel  - 1 x daily - 7 x weekly - 1 sets - 5-7 reps - 5 second hold  GOALS: Goals reviewed with patient? Yes  SHORT TERM GOALS: Target date: 11/17/2023   Pt will be independent with initial HEP for improved strength and cervical ROM.  Baseline: not established on eval  Goal status: INITIAL  2.  NDI to be assessed and LTG updated  Baseline: 16/45 (7/24) Goal status: MET  3.  Pt will improve cervical flexion A/ROM to >/= 45 degrees for improved functional use of C-spine and reduced pain  Baseline:  Active ROM A/PROM (deg) eval  Flexion 31  Extension 30  Right lateral flexion 34  Left lateral flexion 47  Right rotation 67  Left rotation 40   (Blank rows = not tested)  Goal status: INITIAL  4.  Pt will improved L cervical rotation A/ROM to >/= 50 degrees for reduced pain and improved quality of life  Baseline:  Active ROM A/PROM (deg) eval  Flexion 31  Extension 30  Right lateral flexion 34  Left lateral flexion 47  Right rotation 67  Left rotation 40   (Blank rows = not tested)  Goal status: INITIAL   LONG TERM GOALS: Target date: 12/01/2023  Pt will improve her score on the NDI to </= 11/45 to demonstrate improved function and decreased disability level Baseline:  16/45 (7/24), moderate disability Goal status: INITIAL  2.  Pt will improved R lateral flexion cervical A/ROM to >/= 45 degrees for improved functional use of C-spine and reduced pain  Baseline:  Active ROM A/PROM (deg) eval  Flexion 31  Extension 30  Right lateral flexion 34  Left lateral flexion 47  Right rotation 67  Left rotation 40   (Blank rows = not tested)  Goal status: INITIAL  3.  Pt will be independent with final HEP for improved strength and cervical ROM.  Baseline:  Goal status: INITIAL   ASSESSMENT:  CLINICAL IMPRESSION: Session limited as pt became frustrated due to expressive aphasia while attempting to describe pain to therapist and became tearful. Pt's husband requesting to take pt home early as pt very emotional and could not participate in PT. Unable to assess STGs due to this but will assess next session. Continue POC.      OBJECTIVE IMPAIRMENTS: decreased cognition, decreased knowledge of condition, decreased mobility, decreased ROM, decreased strength, impaired perceived functional ability, impaired flexibility, impaired UE functional use, improper body mechanics, postural dysfunction, and pain  ACTIVITY LIMITATIONS: carrying, lifting, sleeping, transfers, reach over head, locomotion level, and caring for others  PARTICIPATION LIMITATIONS: meal prep, cleaning, laundry, medication management, interpersonal relationship, driving, shopping, community activity, occupation, and yard work  PERSONAL FACTORS: Behavior pattern, Past/current experiences, Time since onset of injury/illness/exacerbation, and 1-2 comorbidities: hx of SAH w/unknown etiology, expressive aphasia are also affecting patient's functional outcome.   REHAB POTENTIAL: Good  CLINICAL DECISION MAKING: Unstable/unpredictable  EVALUATION COMPLEXITY: High  PLAN:  PT FREQUENCY: 2x/week  PT DURATION: 6 weeks  PLANNED INTERVENTIONS: 97164- PT Re-evaluation, 97750- Physical Performance  Testing, 97110-Therapeutic exercises, 97530- Therapeutic activity, 97112- Neuromuscular re-education, 97535- Self Care, [id number]- Manual therapy, [id number]- Gait training, [phone]- Aquatic Therapy, [phone] (1-2 muscles), [id number] (3+ muscles)- Dry Needling, Patient/Family education, Balance training, Joint mobilization, Spinal mobilization, and Vestibular training  PLAN FOR NEXT SESSION: Check STGs. TPDN to R upper traps, suboccipitals, semispinalis. Add to HEP for gentle upper trap stretch and cervical ROM and strengthening, Watch vitals, how is R pec and shoulder doing?   Virgene Tirone E Bilbo Carcamo, PT, DPT  11/21/2023, 9:08 AM

## 2023-11-23 ENCOUNTER — Ambulatory Visit: Admitting: Physical Therapy

## 2023-11-23 VITALS — BP 122/80 | HR 58

## 2023-11-23 DIAGNOSIS — R293 Abnormal posture: Secondary | ICD-10-CM

## 2023-11-23 DIAGNOSIS — M542 Cervicalgia: Secondary | ICD-10-CM | POA: Diagnosis not present

## 2023-11-23 DIAGNOSIS — R2689 Other abnormalities of gait and mobility: Secondary | ICD-10-CM | POA: Diagnosis not present

## 2023-11-23 DIAGNOSIS — M6281 Muscle weakness (generalized): Secondary | ICD-10-CM

## 2023-11-23 NOTE — Therapy (Signed)
 OUTPATIENT PHYSICAL THERAPY NEURO TREATMENT   Patient Name: Amy Barry MRN: [id number] DOB:1970-07-17, 53 y.o., female Today's Date: 11/23/2023   PCP: Sun, Vyvyan, MD REFERRING PROVIDER: Skeet Juliene SAUNDERS, DO  END OF SESSION:  PT End of Session - 11/23/23 0849     Visit Number 9    Number of Visits 13   Plus eval   Date for PT Re-Evaluation 12/08/23    Authorization Type [employer]    Authorization Time Period 10/20/23 - 12/01/23 13 PT visits    PT Start Time 0847    PT Stop Time 0928    PT Time Calculation (min) 41 min    Activity Tolerance Patient tolerated treatment well    Behavior During Therapy [facility] for tasks assessed/performed               Past Medical History:  Diagnosis Date   Anxiety    Arthralgia    Generalized weakness    bilateral upper and lower extremities,  walks with cane   GERD (gastroesophageal reflux disease)    Headache(784.0)    Heart murmur    per pt since childhood, no problems   History of kidney stones    Hypertension    followed pcp   IBS (irritable bowel syndrome)    Iron deficiency anemia    12-06-2017 per pt recently had IV iron infusion July 2019   Low vitamin D level    Osteoarthritis    pt unsure about this   PVC (premature ventricular contraction)    per pt had holter monitor   SLE (systemic lupus erythematosus) (HCC)    rheumotologist-- dr syed   Stroke (HCC) 06/2020   Wears glasses    Past Surgical History:  Procedure Laterality Date   CERVICAL DISC ARTHROPLASTY N/A 10/23/2015   Procedure: Cervical Disc Arthroplasty Cervical six-seven;  Surgeon: Alm GORMAN Molt, MD;  Location: [facility];  Service: Neurosurgery;  Laterality: N/A;   D & C HYSTEROSCOPY W/ RESECTION ENDOMETRIAL POLYP  10-13-2003    dr rivard @[facility]    ESOPHAGOGASTRODUODENOSCOPY     for GERD   IR ANGIO INTRA EXTRACRAN SEL COM CAROTID INNOMINATE BILAT MOD SED  07/06/2020   IR ANGIO INTRA EXTRACRAN SEL INTERNAL CAROTID BILAT MOD SED  06/29/2020   IR ANGIO VERTEBRAL  SEL VERTEBRAL BILAT MOD SED  06/29/2020   IR ANGIO VERTEBRAL SEL VERTEBRAL BILAT MOD SED  07/06/2020   LAPAROSCOPIC ABDOMINAL EXPLORATION  1996   dx ibs   OVARIAN CYST SURGERY  age 10   x2 cyst   RADIOLOGY WITH ANESTHESIA N/A 06/29/2020   Procedure: IR WITH ANESTHESIA;  Surgeon: Radiologist, Medication, MD;  Location: [facility] OR;  Service: Radiology;  Laterality: N/A;   ROBOTIC ASSISTED TOTAL HYSTERECTOMY N/A 12/13/2017   Procedure: XI ROBOTIC ASSISTED TOTAL HYSTERECTOMYWITH BILATERAL SALPINGECTOMY;  Surgeon: Rosalva Sawyer, MD;  Location: [facility];  Service: Gynecology;  Laterality: N/A;   TUBAL LIGATION Bilateral 06-01-2002   dr henry @[facility]    PPTL   Patient Active Problem List   Diagnosis Date Noted   Migraine 04/19/2023   Generalized anxiety disorder 05/05/2021   Biceps tendonitis on left 02/10/2021   Vascular headache 08/05/2020   Hyponatremia 07/28/2020   Headache 07/24/2020   Neck pain, musculoskeletal 07/24/2020   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    Subarachnoid bleed (HCC) 07/09/2020   Subarachnoid hemorrhage (HCC) 06/29/2020   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HCC) 03/04/2019   S/P hysterectomy 12/13/2017   Abnormal laboratory test  09/09/2016   Gait abnormality 08/03/2016   Chronic fatigue 08/03/2016   Paresthesia 08/03/2016   S/P cervical spinal fusion 10/23/2015   Hypersomnia 12/08/2010   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   EPIGASTRIC PAIN 10/13/2009   IRRITABLE BOWEL SYNDROME 03/10/2009   BACK PAIN, LUMBAR 07/01/2008   COSTOCHONDRITIS 02/01/2008   VIRAL URI 01/08/2008   CHEST PAIN 01/08/2008   OTHER SPECIFIED EPISODIC MOOD DISORDER 12/25/2007   ARM PAIN 12/25/2007   ANEMIA-NOS 10/02/2007   Essential hypertension 10/02/2007   GERD 10/02/2007   POLYP, GALLBLADDER 10/02/2007   UTERINE POLYP 10/02/2007   OSTEOARTHRITIS 10/02/2007   Dyskinesia 10/02/2007   HEADACHE 10/02/2007   PALPITATIONS 10/02/2007   CARDIAC MURMUR, HX OF 10/02/2007   NEPHROLITHIASIS, HX OF  10/02/2007    ONSET DATE: 10/11/2023 (referral)   REFERRING DIAG: M54.2 (ICD-10-CM) - Neck pain  THERAPY DIAG:  Muscle weakness (generalized)  Abnormal posture  Cervicalgia  Other abnormalities of gait and mobility  Rationale for Evaluation and Treatment: Rehabilitation  SUBJECTIVE:                                                                                                                                                                                             SUBJECTIVE STATEMENT:  Pt presents with husband. States she is feeling okay. States she threw up on Tuesday when she left PT and had a bad headache all day. Took some Tylenol  w/codeine  and that was the only thing that helped. Took Tylenol  this morning and is feeling a bit better. Husband reports pt being nauseous is not unusual and he took her to eat after she vomited Tuesday and she was okay.   Pt accompanied by: [name]   PERTINENT HISTORY: anxiety, GERD, headache, HTN, SLE, CVA and aneurysm with resultant dysarthria (2022),  PAIN:  Are you having pain? Yes: NPRS scale: 5/10  Pain location: R upper trap, Neck  Pain description: Achy, sharp, throbbing  Aggravating factors: Catatonic episodes, anxiety  Relieving factors: None  PRECAUTIONS: Fall  RED FLAGS: None   WEIGHT BEARING RESTRICTIONS: No  FALLS: Has patient fallen in last 6 months? No   PLOF: Needs assistance with ADLs, Needs assistance with homemaking, Needs assistance with gait, and Needs assistance with transfers  PATIENT GOALS: Unable to assess on eval due to emotions and aphasia   OBJECTIVE:  Note: Objective measures were completed at Evaluation unless otherwise noted.  DIAGNOSTIC FINDINGS: From Dr. Jayme note on 10/11/23:  10/27/2011 MRI CERVICAL SPINE WO:  Tiny central disc bulges from C2-3 through C4-5 with no significant neural impingement. 06/19/2012 CTA HEAD:  Negative 09/12/2015 MRI CERVICAL SPINE WO:  Progressed cervical  spondylosis and degenerative disc disease with left foraminal disc protrusion and nerve impingement at C6-7, moderate impingement at C3-4 and C4-5 and mild impingement at C5-6. 08/03/2016 MRI BRAIN WO:  Unremarkable 08/03/2016 MRI CERVICAL SPINE WO:  Status post arthrodesis at C6-7 with questionable posterior migration of C6-7 implant, mild to moderate canal stenosis with mild cord flattening without definite cord signal changes; moderate degenerative spondylosis with disc protrusions at C3-4, C4-5, and C5-6. 08/04/2016 MRI CERVICAL SPINE WO:  C6-7 arthrodesis now appears in place. 10/06/2016 MRI LUMBAR SPINE WO:  Degenerative disc disease and mild bilateral foraminal narrowing at L5-S1 without significant compression; mild ligamentum flavum hypertrophy and facet degenerative facet changes at L3-4 and L4-5 without compression. 03/04/2019 X-RAY BILATERAL HIP/PELVIS:  Mild degenerative changes both hips.  Otherwise, unremarkable. 05/18/2019 MRI BRAIN W WO:  Normal exam.  No change since 2018. 05/18/2019 MRI C-SPINE W WO:  C3-4: Chronic central disc herniation effaces the ventral subarachnoid space and indents the ventral cord. AP diameter of the canal in the midline only 6.5 mm. Similar appearance to the study of 2018, possibly with minimal enlargement of the disc. C4-5: Chronic central disc herniation effaces the ventral subarachnoid space and indents the ventral cord. AP diameter of the canal in the midline is 8 mm. Similar appearance to the study of 2018, possibly with minimal enlargement of the disc.  C5-6: Mild noncompressive disc bulge.  C6-7: Previous disc arthroplasty. Extensive artifact precludes MR evaluation of the canal or foramina at this level. 06/21/2019 MRI T-SPINE W WO:  Normal thoracic spine MRI. No finding to explain the patient's symptoms. 06/21/2019 MRI L-SPINE W WO:  Mild degenerative disc disease L3-4 and L5-S1 without central canal or foraminal stenosis. The examination is otherwise  negative. No finding to explain the patient's symptoms. 06/29/2020 CT HEAD:  Subarachnoid hemorrhage in the posterior fossa with aneurysmal pattern. There is intraventricular reflux and ventriculomegaly. 06/29/2020 CTA HEAD &  NECK:  No definite source of subarachnoid hemorrhage but there poor visualization of the proximal left PICA with a tiny bulbous area, question ruptured dissection. 07/06/2020 CEREBRAL ANGIOGRAM:  1. No intracranial aneurysms, arteriovenous malformations, or  fistulas are identified. 2. Mild, nonflow limiting vasospasm involving the supraclinoid  segment of the internal carotid arteries bilaterally.  11/25/2020 MRA HEAD WO:  1. Question 2 mm saccular outpouching extending medially and inferiorly from the paraophthalmic left ICA. Finding is indeterminate, but could reflect a small paraophthalmic aneurysm. This is not definitely seen on previous exams. 2. Otherwise normal intracranial MRA. 08/27/2023 MRI BRAIN WO:  1. No acute intracranial abnormality. 2. 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  COGNITION: Overall cognitive status: Impaired   SENSATION: Not tested    POSTURE: rounded shoulders, forward head, and increased thoracic kyphosis/   LOWER EXTREMITY MMT:    MMT Right Eval Left Eval  Hip flexion    Hip extension    Hip abduction    Hip adduction    Hip internal rotation    Hip external rotation    Knee flexion    Knee extension    Ankle dorsiflexion    Ankle plantarflexion    Ankle inversion    Ankle eversion    (Blank rows = not tested)  CERVICAL ROM: tested in seated position  Active ROM A/ROM (deg) eval A/ROM (Deg) 11/23/23  Flexion 31 31  Extension 30   Right lateral flexion 34   Left lateral flexion 47  Right rotation 67   Left rotation 40 54   (Blank rows = not tested)   PALPATION: TTP along R upper trap, R suboccipitals, R rhomboids and bilateral  semispinalis capitis   TRANSFERS: Sit to stand: Modified independence  Assistive device utilized: None     Stand to sit: Modified independence  Assistive device utilized: None      RAMP:  Not tested  CURB:  Not tested  STAIRS: Not tested  GAIT: Gait pattern: WFL Distance walked: Various clinic distances  Assistive device utilized: None Level of assistance: Modified independence Comments: No instability noted    PATIENT SURVEYS:  NDI: to be assessed    VITALS  Vitals:   11/23/23 0859  BP: 122/80  Pulse: (!) 58                                                                                                                                  TREATMENT:   Self-care/home management  Assessed vitals (see above) and WNL   Ther Act - STG Assessment   Active ROM A/ROM (deg) eval A/ROM (Deg) 11/23/23  Flexion 31 31  Extension 30   Right lateral flexion 34   Left lateral flexion 47   Right rotation 67   Left rotation 40 54   (Blank rows = not tested)   Sidelying open books, x10 reps per side, for improved thoracic mobility and stretch of anterior shoulder capsule. Min cues to maintain gaze on moving arm for added cervical ROM. Added to HEP (see bolded below)  Bent over rows w/5# KB, x10 reps per side. Min cues to reduce shoulder shrug on R side. Pt reporting pain in R shoulder if shrugging, but pain reduced if she activated paraspinals  Seated shoulder abduction w/yellow resistance band, x10 reps, for improved periscapular strength. Pt required mod multimodal cues for proper technique throughout to avoid shoulder shrug compensation. Pt unable to perform unless she had very light resistance due to shoulder pain.     PATIENT EDUCATION: Education details: updates to HEP  Person educated: Patient and Spouse Education method: Explanation, Demonstration, Actor cues, Verbal cues, and Handouts Education comprehension: verbalized understanding, returned demonstration, verbal  cues required, tactile cues required, and needs further education  HOME EXERCISE PROGRAM: Access Code: [email or web address] URL: [email or web address] Date: 10/26/2023 Prepared by: Taylor Turkalo  Exercises - Seated Upper Trapezius Stretch  - 1 x daily - 7 x weekly - 1 sets - 3-5 reps - 30 sec hold - Gentle Levator Scapulae Stretch  - 1 x daily - 7 x weekly - 1 sets - 3-5 reps - 30 sec hold - Supine Suboccipital Release with Tennis Balls  - 1 x daily - 7 x weekly - 1 sets - 1 reps - 5 min hold - Sternocleidomastoid Stretch  - 1 x daily - 7 x weekly - 1 sets - 3-5 reps - 30 sec hold - Sternocleidomastoid Release  - 1 x  daily - 7 x weekly - 1 sets - 1 reps - 5 min hold - Supine Shoulder External Rotation Stretch  - 1 x daily - 7 x weekly - 1 sets - 10 reps - 5-10 sec hold - Supine Thoracic Mobilization Towel Roll Vertical with Arm Stretch  - 1 x daily - 7 x weekly - 3-5 minutes  hold - Hooklying Upper Neck Extension   - 1 x daily - 7 x weekly - 3 sets - 10 reps - Supine Cervical Retraction with Towel  - 1 x daily - 7 x weekly - 3 sets - 10 reps - Standing Bilateral Low Shoulder Row with Anchored Resistance  - 1 x daily - 7 x weekly - 3 sets - 10 reps - 90/90 SI Joint Self-Correction with Dowel  - 1 x daily - 7 x weekly - 1 sets - 5-7 reps - 5 second hold - Sidelying Thoracic Rotation with Open Book  - 1 x daily - 7 x weekly - 3 sets - 10 reps  GOALS: Goals reviewed with patient? Yes  SHORT TERM GOALS: Target date: 11/17/2023   Pt will be independent with initial HEP for improved strength and cervical ROM.  Baseline: not established on eval  Goal status: MET  2.  NDI to be assessed and LTG updated  Baseline: 16/45 (7/24) Goal status: MET  3.  Pt will improve cervical flexion A/ROM to >/= 45 degrees for improved functional use of C-spine and reduced pain  Baseline:  Active ROM A/ROM (deg) eval A/ROM (Deg) 11/23/23  Flexion 31 31  Extension 30   Right lateral flexion 34    Left lateral flexion 47   Right rotation 67   Left rotation 40 54   (Blank rows = not tested)  Goal status: NOT MET  4.  Pt will improved L cervical rotation A/ROM to >/= 50 degrees for reduced pain and improved quality of life  Baseline:  Active ROM A/PROM (deg) eval A/ROM (Deg) 11/23/23  Flexion 31 31  Extension 30   Right lateral flexion 34   Left lateral flexion 47   Right rotation 67   Left rotation 40 54   (Blank rows = not tested)  Goal status: MET   LONG TERM GOALS: Target date: 12/01/2023  Pt will improve her score on the NDI to </= 11/45 to demonstrate improved function and decreased disability level Baseline: 16/45 (7/24), moderate disability Goal status: INITIAL  2.  Pt will improved R lateral flexion cervical A/ROM to >/= 45 degrees for improved functional use of C-spine and reduced pain  Baseline:  Active ROM A/PROM (deg) eval A/ROM (Deg) 11/23/23  Flexion 31 31  Extension 30   Right lateral flexion 34   Left lateral flexion 47   Right rotation 67   Left rotation 40 54   (Blank rows = not tested)  Goal status: INITIAL  3.  Pt will be independent with final HEP for improved strength and cervical ROM.  Baseline:  Goal status: INITIAL   ASSESSMENT:  CLINICAL IMPRESSION: Emphasis of skilled PT session on STG assessment, thoracic mobility and periscapular strength. Pt has met 3 of 4 STGs, reporting independence w/HEP and completing NDI. Pt has also improved her L cervical rotation A/ROM by >10 degrees, but maintained her cervical flexion A/ROM so did not meet that goal. Pt continues to be limited by shoulder pain and requires mod multimodal cues to reduce shoulder shrug compensation w/movements as this triggers her pain. Continue POC.  OBJECTIVE IMPAIRMENTS: decreased cognition, decreased knowledge of condition, decreased mobility, decreased ROM, decreased strength, impaired perceived functional ability, impaired flexibility, impaired UE functional  use, improper body mechanics, postural dysfunction, and pain  ACTIVITY LIMITATIONS: carrying, lifting, sleeping, transfers, reach over head, locomotion level, and caring for others  PARTICIPATION LIMITATIONS: meal prep, cleaning, laundry, medication management, interpersonal relationship, driving, shopping, community activity, occupation, and yard work  PERSONAL FACTORS: Behavior pattern, Past/current experiences, Time since onset of injury/illness/exacerbation, and 1-2 comorbidities: hx of SAH w/unknown etiology, expressive aphasia are also affecting patient's functional outcome.   REHAB POTENTIAL: Good  CLINICAL DECISION MAKING: Unstable/unpredictable  EVALUATION COMPLEXITY: High  PLAN:  PT FREQUENCY: 2x/week  PT DURATION: 6 weeks  PLANNED INTERVENTIONS: 02835- PT Re-evaluation, 97750- Physical Performance Testing, 97110-Therapeutic exercises, 97530- Therapeutic activity, [id number]- Neuromuscular re-education, 97535- Self Care, [id number]- Manual therapy, [id number]- Gait training, [phone]- Aquatic Therapy, [phone] (1-2 muscles), [phone] (3+ muscles)- Dry Needling, Patient/Family education, Balance training, Joint mobilization, Spinal mobilization, and Vestibular training  PLAN FOR NEXT SESSION: 10th visit PN. TPDN to R upper traps, suboccipitals, semispinalis. Add to HEP for gentle upper trap stretch and cervical ROM and strengthening, Watch vitals, how is R pec and shoulder doing? Periscapular strength    Marlon BRAVO Abdelaziz Westenberger, PT, DPT  11/23/2023, 9:29 AM

## 2023-11-28 ENCOUNTER — Ambulatory Visit: Admitting: Physical Therapy

## 2023-11-28 VITALS — BP 111/74 | HR 58

## 2023-11-28 DIAGNOSIS — R2689 Other abnormalities of gait and mobility: Secondary | ICD-10-CM | POA: Diagnosis not present

## 2023-11-28 DIAGNOSIS — M6281 Muscle weakness (generalized): Secondary | ICD-10-CM | POA: Diagnosis not present

## 2023-11-28 DIAGNOSIS — M542 Cervicalgia: Secondary | ICD-10-CM | POA: Diagnosis not present

## 2023-11-28 DIAGNOSIS — R293 Abnormal posture: Secondary | ICD-10-CM

## 2023-11-28 NOTE — Therapy (Signed)
 OUTPATIENT PHYSICAL THERAPY NEURO TREATMENT - 10th VISIT PROGRESS NOTE   Patient Name: Amy Barry MRN: [id number] DOB:1971/01/24, 53 y.o., female Today's Date: 11/28/2023   PCP: Sun, Vyvyan, MD REFERRING PROVIDER: Skeet Juliene SAUNDERS, DO  Physical Therapy Progress Note   Dates of Reporting Period: 10/20/2023 - 11/28/2023  See Note from 11/23/23 for Objective Data and Assessment of Progress/Goals.  Thank you for the referral of this patient. Waddell Southgate, PT, DPT, CSRS   END OF SESSION:  PT End of Session - 11/28/23 0854     Visit Number 10    Number of Visits 13   Plus eval   Date for PT Re-Evaluation 12/08/23    Authorization Type [employer]    Authorization Time Period 10/20/23 - 12/01/23 13 PT visits    PT Start Time 0851   pt arrived late   PT Stop Time 0930    PT Time Calculation (min) 39 min    Activity Tolerance Patient tolerated treatment well    Behavior During Therapy WFL for tasks assessed/performed                Past Medical History:  Diagnosis Date   Anxiety    Arthralgia    Generalized weakness    bilateral upper and lower extremities,  walks with cane   GERD (gastroesophageal reflux disease)    Headache(784.0)    Heart murmur    per pt since childhood, no problems   History of kidney stones    Hypertension    followed pcp   IBS (irritable bowel syndrome)    Iron deficiency anemia    12-06-2017 per pt recently had IV iron infusion July 2019   Low vitamin D level    Osteoarthritis    pt unsure about this   PVC (premature ventricular contraction)    per pt had holter monitor   SLE (systemic lupus erythematosus) (HCC)    rheumotologist-- dr syed   Stroke [facility]) 06/2020   Wears glasses    Past Surgical History:  Procedure Laterality Date   CERVICAL DISC ARTHROPLASTY N/A 10/23/2015   Procedure: Cervical Disc Arthroplasty Cervical six-seven;  Surgeon: Alm GORMAN Molt, MD;  Location: [facility];  Service: Neurosurgery;  Laterality: N/A;   D & C  HYSTEROSCOPY W/ RESECTION ENDOMETRIAL POLYP  10-13-2003    dr rivard @[facility]    ESOPHAGOGASTRODUODENOSCOPY     for GERD   IR ANGIO INTRA EXTRACRAN SEL COM CAROTID INNOMINATE BILAT MOD SED  07/06/2020   IR ANGIO INTRA EXTRACRAN SEL INTERNAL CAROTID BILAT MOD SED  06/29/2020   IR ANGIO VERTEBRAL SEL VERTEBRAL BILAT MOD SED  06/29/2020   IR ANGIO VERTEBRAL SEL VERTEBRAL BILAT MOD SED  07/06/2020   LAPAROSCOPIC ABDOMINAL EXPLORATION  1996   dx ibs   OVARIAN CYST SURGERY  age 89   x2 cyst   RADIOLOGY WITH ANESTHESIA N/A 06/29/2020   Procedure: IR WITH ANESTHESIA;  Surgeon: Radiologist, Medication, MD;  Location: [facility] OR;  Service: Radiology;  Laterality: N/A;   ROBOTIC ASSISTED TOTAL HYSTERECTOMY N/A 12/13/2017   Procedure: XI ROBOTIC ASSISTED TOTAL HYSTERECTOMYWITH BILATERAL SALPINGECTOMY;  Surgeon: Rosalva Sawyer, MD;  Location: [facility];  Service: Gynecology;  Laterality: N/A;   TUBAL LIGATION Bilateral 06-01-2002   dr henry @[facility]    PPTL   Patient Active Problem List   Diagnosis Date Noted   Migraine 04/19/2023   Generalized anxiety disorder 05/05/2021   Biceps tendonitis on left 02/10/2021   Vascular headache 08/05/2020   Hyponatremia 07/28/2020  Headache 07/24/2020   Neck pain, musculoskeletal 07/24/2020   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    Subarachnoid bleed (HCC) 07/09/2020   Subarachnoid hemorrhage (HCC) 06/29/2020   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HCC) 03/04/2019   S/P hysterectomy 12/13/2017   Abnormal laboratory test 09/09/2016   Gait abnormality 08/03/2016   Chronic fatigue 08/03/2016   Paresthesia 08/03/2016   S/P cervical spinal fusion 10/23/2015   Hypersomnia 12/08/2010   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   EPIGASTRIC PAIN 10/13/2009   IRRITABLE BOWEL SYNDROME 03/10/2009   BACK PAIN, LUMBAR 07/01/2008   COSTOCHONDRITIS 02/01/2008   VIRAL URI 01/08/2008   CHEST PAIN 01/08/2008   OTHER SPECIFIED EPISODIC MOOD DISORDER 12/25/2007   ARM PAIN 12/25/2007   ANEMIA-NOS  10/02/2007   Essential hypertension 10/02/2007   GERD 10/02/2007   POLYP, GALLBLADDER 10/02/2007   UTERINE POLYP 10/02/2007   OSTEOARTHRITIS 10/02/2007   Dyskinesia 10/02/2007   HEADACHE 10/02/2007   PALPITATIONS 10/02/2007   CARDIAC MURMUR, HX OF 10/02/2007   NEPHROLITHIASIS, HX OF 10/02/2007    ONSET DATE: 10/11/2023 (referral)   REFERRING DIAG: M54.2 (ICD-10-CM) - Neck pain  THERAPY DIAG:  Muscle weakness (generalized)  Abnormal posture  Cervicalgia  Rationale for Evaluation and Treatment: Rehabilitation  SUBJECTIVE:                                                                                                                                                                                             SUBJECTIVE STATEMENT:  Pt reports that her pain continues to be up and down but overall is better. Pt reports that she got a shot for her headaches on Sunday, they usually last for a month but she can feel it crash when the shot runs out and this time it ran out about a week before she was due to get another shot.  Pt reports that things are going well with her stretches and exercises.  Pt accompanied by: [name]   PERTINENT HISTORY: anxiety, GERD, headache, HTN, SLE, CVA and aneurysm with resultant dysarthria (2022),  PAIN:  Are you having pain? Yes: NPRS scale: 5/10  Pain location: R upper trap, Neck, headache  Pain description: Achy, sharp, throbbing  Aggravating factors: Catatonic episodes, anxiety  Relieving factors: None  PRECAUTIONS: Fall  RED FLAGS: None   WEIGHT BEARING RESTRICTIONS: No  FALLS: Has patient fallen in last 6 months? No   PLOF: Needs assistance with ADLs, Needs assistance with homemaking, Needs assistance with gait, and Needs assistance with transfers  PATIENT GOALS: Unable to assess on eval due to emotions and aphasia   OBJECTIVE:  Note: Objective measures were  completed at Evaluation unless otherwise noted.  DIAGNOSTIC  FINDINGS: From Dr. Jayme note on 10/11/23:  10/27/2011 MRI CERVICAL SPINE WO:  Tiny central disc bulges from C2-3 through C4-5 with no significant neural impingement. 06/19/2012 CTA HEAD:  Negative 09/12/2015 MRI CERVICAL SPINE WO:  Progressed cervical spondylosis and degenerative disc disease with left foraminal disc protrusion and nerve impingement at C6-7, moderate impingement at C3-4 and C4-5 and mild impingement at C5-6. 08/03/2016 MRI BRAIN WO:  Unremarkable 08/03/2016 MRI CERVICAL SPINE WO:  Status post arthrodesis at C6-7 with questionable posterior migration of C6-7 implant, mild to moderate canal stenosis with mild cord flattening without definite cord signal changes; moderate degenerative spondylosis with disc protrusions at C3-4, C4-5, and C5-6. 08/04/2016 MRI CERVICAL SPINE WO:  C6-7 arthrodesis now appears in place. 10/06/2016 MRI LUMBAR SPINE WO:  Degenerative disc disease and mild bilateral foraminal narrowing at L5-S1 without significant compression; mild ligamentum flavum hypertrophy and facet degenerative facet changes at L3-4 and L4-5 without compression. 03/04/2019 X-RAY BILATERAL HIP/PELVIS:  Mild degenerative changes both hips.  Otherwise, unremarkable. 05/18/2019 MRI BRAIN W WO:  Normal exam.  No change since 2018. 05/18/2019 MRI C-SPINE W WO:  C3-4: Chronic central disc herniation effaces the ventral subarachnoid space and indents the ventral cord. AP diameter of the canal in the midline only 6.5 mm. Similar appearance to the study of 2018, possibly with minimal enlargement of the disc. C4-5: Chronic central disc herniation effaces the ventral subarachnoid space and indents the ventral cord. AP diameter of the canal in the midline is 8 mm. Similar appearance to the study of 2018, possibly with minimal enlargement of the disc.  C5-6: Mild noncompressive disc bulge.  C6-7: Previous disc arthroplasty. Extensive artifact precludes MR evaluation of the canal or foramina at this  level. 06/21/2019 MRI T-SPINE W WO:  Normal thoracic spine MRI. No finding to explain the patient's symptoms. 06/21/2019 MRI L-SPINE W WO:  Mild degenerative disc disease L3-4 and L5-S1 without central canal or foraminal stenosis. The examination is otherwise negative. No finding to explain the patient's symptoms. 06/29/2020 CT HEAD:  Subarachnoid hemorrhage in the posterior fossa with aneurysmal pattern. There is intraventricular reflux and ventriculomegaly. 06/29/2020 CTA HEAD &  NECK:  No definite source of subarachnoid hemorrhage but there poor visualization of the proximal left PICA with a tiny bulbous area, question ruptured dissection. 07/06/2020 CEREBRAL ANGIOGRAM:  1. No intracranial aneurysms, arteriovenous malformations, or  fistulas are identified. 2. Mild, nonflow limiting vasospasm involving the supraclinoid  segment of the internal carotid arteries bilaterally.  11/25/2020 MRA HEAD WO:  1. Question 2 mm saccular outpouching extending medially and inferiorly from the paraophthalmic left ICA. Finding is indeterminate, but could reflect a small paraophthalmic aneurysm. This is not definitely seen on previous exams. 2. Otherwise normal intracranial MRA. 08/27/2023 MRI BRAIN WO:  1. No acute intracranial abnormality. 2. 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  COGNITION: Overall cognitive status: Impaired   SENSATION: Not tested    POSTURE: rounded shoulders, forward head, and increased thoracic kyphosis/   LOWER EXTREMITY MMT:    MMT Right Eval Left Eval  Hip flexion    Hip extension    Hip abduction    Hip adduction    Hip internal rotation    Hip external rotation    Knee flexion    Knee extension    Ankle dorsiflexion    Ankle plantarflexion    Ankle inversion    Ankle  eversion    (Blank rows = not tested)  CERVICAL ROM: tested in seated position  Active ROM A/ROM (deg) eval  A/ROM (Deg) 11/23/23  Flexion 31 31  Extension 30   Right lateral flexion 34   Left lateral flexion 47   Right rotation 67   Left rotation 40 54   (Blank rows = not tested)   PALPATION: TTP along R upper trap, R suboccipitals, R rhomboids and bilateral semispinalis capitis   TRANSFERS: Sit to stand: Modified independence  Assistive device utilized: None     Stand to sit: Modified independence  Assistive device utilized: None      RAMP:  Not tested  CURB:  Not tested  STAIRS: Not tested  GAIT: Gait pattern: WFL Distance walked: Various clinic distances  Assistive device utilized: None Level of assistance: Modified independence Comments: No instability noted    PATIENT SURVEYS:  NDI: to be assessed    VITALS  Vitals:   11/28/23 0900  BP: 111/74  Pulse: (!) 58                                                                                                                                 TREATMENT:   Self-care/home management  Assessed vitals (see above) and WNL   Ther Act To address decreased R shoulder ROM Supine shoulder flexion with dowel rod x 5 reps  Onset of sharp pain in R shoulder Seated AAROM shoulder abduction stretch with towel x 5 reps Added to HEP, see bolded below  Manaul Therapy With patient in supine Suboccipital release 4 x 30 sec each Lateral cervical flexion 3 x 30 sec each B TPR to suboccipitals, cervical paraspinals, and R UT (painful) Attempted PROM of R shoulder, limited in flexion and limited to about 90 degrees of abduction with muscle guarding    PATIENT EDUCATION: Education details: updates to HEP, PT POC with plan to d/c next visit Person educated: Patient and Spouse Education method: Explanation, Demonstration, Tactile cues, Verbal cues, and Handouts Education comprehension: verbalized understanding, returned demonstration, verbal cues required, tactile cues required, and needs further education  HOME EXERCISE  PROGRAM: Access Code: [email or web address] URL: [email or web address] Date: 10/26/2023 Prepared by: Marcine Gadway  Exercises - Seated Upper Trapezius Stretch  - 1 x daily - 7 x weekly - 1 sets - 3-5 reps - 30 sec hold - Gentle Levator Scapulae Stretch  - 1 x daily - 7 x weekly - 1 sets - 3-5 reps - 30 sec hold - Supine Suboccipital Release with Tennis Balls  - 1 x daily - 7 x weekly - 1 sets - 1 reps - 5 min hold - Sternocleidomastoid Stretch  - 1 x daily - 7 x weekly - 1 sets - 3-5 reps - 30 sec hold - Sternocleidomastoid Release  - 1 x daily - 7 x weekly - 1 sets - 1 reps - 5 min hold - Supine Shoulder External Rotation Stretch  -  1 x daily - 7 x weekly - 1 sets - 10 reps - 5-10 sec hold - Supine Thoracic Mobilization Towel Roll Vertical with Arm Stretch  - 1 x daily - 7 x weekly - 3-5 minutes  hold - Hooklying Upper Neck Extension   - 1 x daily - 7 x weekly - 3 sets - 10 reps - Supine Cervical Retraction with Towel  - 1 x daily - 7 x weekly - 3 sets - 10 reps - Standing Bilateral Low Shoulder Row with Anchored Resistance  - 1 x daily - 7 x weekly - 3 sets - 10 reps - 90/90 SI Joint Self-Correction with Dowel  - 1 x daily - 7 x weekly - 1 sets - 5-7 reps - 5 second hold - Sidelying Thoracic Rotation with Open Book  - 1 x daily - 7 x weekly - 3 sets - 10 reps - Seated Shoulder Abduction Towel Slide at Table Top  - 1 x daily - 7 x weekly - 1 sets - 3-5 reps - 30 sec hold  GOALS: Goals reviewed with patient? Yes  SHORT TERM GOALS: Target date: 11/17/2023   Pt will be independent with initial HEP for improved strength and cervical ROM.  Baseline: not established on eval  Goal status: MET  2.  NDI to be assessed and LTG updated  Baseline: 16/45 (7/24) Goal status: MET  3.  Pt will improve cervical flexion A/ROM to >/= 45 degrees for improved functional use of C-spine and reduced pain  Baseline:  Active ROM A/ROM (deg) eval A/ROM (Deg) 11/23/23  Flexion 31 31  Extension 30    Right lateral flexion 34   Left lateral flexion 47   Right rotation 67   Left rotation 40 54   (Blank rows = not tested)  Goal status: NOT MET  4.  Pt will improved L cervical rotation A/ROM to >/= 50 degrees for reduced pain and improved quality of life  Baseline:  Active ROM A/PROM (deg) eval A/ROM (Deg) 11/23/23  Flexion 31 31  Extension 30   Right lateral flexion 34   Left lateral flexion 47   Right rotation 67   Left rotation 40 54   (Blank rows = not tested)  Goal status: MET   LONG TERM GOALS: Target date: 12/01/2023  Pt will improve her score on the NDI to </= 11/45 to demonstrate improved function and decreased disability level Baseline: 16/45 (7/24), moderate disability Goal status: INITIAL  2.  Pt will improved R lateral flexion cervical A/ROM to >/= 45 degrees for improved functional use of C-spine and reduced pain  Baseline:  Active ROM A/PROM (deg) eval A/ROM (Deg) 11/23/23  Flexion 31 31  Extension 30   Right lateral flexion 34   Left lateral flexion 47   Right rotation 67   Left rotation 40 54   (Blank rows = not tested)  Goal status: INITIAL  3.  Pt will be independent with final HEP for improved strength and cervical ROM.  Baseline:  Goal status: INITIAL   ASSESSMENT:  CLINICAL IMPRESSION: Emphasis of skilled PT session on continuing to address tight and painful muscles in cervical and upper shoulder region as well as continuing to address decreased ROM in R shoulder. Pt with ongoing muscle tightness and muscle guarding, she benefits from continued AAROM stretching to continue to address her ROM deficits. Plan to assess LTG and d/c from PT next visit, pt in agreement with this plan. Continue POC.  OBJECTIVE IMPAIRMENTS: decreased cognition, decreased knowledge of condition, decreased mobility, decreased ROM, decreased strength, impaired perceived functional ability, impaired flexibility, impaired UE functional use, improper body mechanics,  postural dysfunction, and pain  ACTIVITY LIMITATIONS: carrying, lifting, sleeping, transfers, reach over head, locomotion level, and caring for others  PARTICIPATION LIMITATIONS: meal prep, cleaning, laundry, medication management, interpersonal relationship, driving, shopping, community activity, occupation, and yard work  PERSONAL FACTORS: Behavior pattern, Past/current experiences, Time since onset of injury/illness/exacerbation, and 1-2 comorbidities: hx of SAH w/unknown etiology, expressive aphasia are also affecting patient's functional outcome.   REHAB POTENTIAL: Good  CLINICAL DECISION MAKING: Unstable/unpredictable  EVALUATION COMPLEXITY: High  PLAN:  PT FREQUENCY: 2x/week  PT DURATION: 6 weeks  PLANNED INTERVENTIONS: 02835- PT Re-evaluation, 97750- Physical Performance Testing, 97110-Therapeutic exercises, 97530- Therapeutic activity, 97112- Neuromuscular re-education, 97535- Self Care, 02859- Manual therapy, [phone]- Gait training, [phone]- Aquatic Therapy, [phone] (1-2 muscles), [phone] (3+ muscles)- Dry Needling, Patient/Family education, Balance training, Joint mobilization, Spinal mobilization, and Vestibular training  PLAN FOR NEXT SESSION: assess LTG and d/c   Waddell Southgate, PT Waddell Southgate, PT, DPT, CSRS   11/28/2023, 9:35 AM

## 2023-11-29 ENCOUNTER — Encounter: Payer: Self-pay | Admitting: Psychology

## 2023-11-29 ENCOUNTER — Encounter: Attending: Physical Medicine & Rehabilitation | Admitting: Psychology

## 2023-11-29 DIAGNOSIS — R41844 Frontal lobe and executive function deficit: Secondary | ICD-10-CM | POA: Insufficient documentation

## 2023-11-29 DIAGNOSIS — F411 Generalized anxiety disorder: Secondary | ICD-10-CM | POA: Insufficient documentation

## 2023-11-29 DIAGNOSIS — R269 Unspecified abnormalities of gait and mobility: Secondary | ICD-10-CM | POA: Insufficient documentation

## 2023-11-29 DIAGNOSIS — I609 Nontraumatic subarachnoid hemorrhage, unspecified: Secondary | ICD-10-CM | POA: Insufficient documentation

## 2023-11-29 DIAGNOSIS — F329 Major depressive disorder, single episode, unspecified: Secondary | ICD-10-CM | POA: Diagnosis present

## 2023-11-29 NOTE — Progress Notes (Signed)
 Neuropsychology Visit  Patient:  Amy Barry   DOB: March 03, 1971  MR Number: [id number]  Location: [facility] [place] [facility] [address] [place], [address] [place] [address] Dept: [phone]  Date of Service: 11/29/2023  Start: 9 AM End: 10 AM  Today's visit was conducted in my outpatient clinic office with the patient and her husband present.  The patient's husband provided some context to what was going on but generally stayed out of the conversation for the most part although he was able to provide some helpful information at various times.  Today's visit also included utilization of a digital scribe which was explained to the patient and her husband with system being HIPAA compliant and only there to assist in notetaking.  The patient consented to allow the use of a digital scribe to assist in the visit today.  Duration of Service: 1 Hour  Provider/Observer:     Norleen JONELLE Asa PsyD  Chief Complaint:      Chief Complaint  Patient presents with   Cerebrovascular Accident   Memory Loss   Anxiety   Depression    Reason For Service:     Amy Barry is a 53 year old female referred for neuropsychological/psychological consult due to ongoing issues with cognitive functioning, expressive language functioning and anxiety and stress associated with post cerebrovascular event symptoms.  Patient has a past medical history that is included hypertension, irritable bowel syndrome, anemia, undetermined connective tissue disease disorder and family history of cerebral aneurysms.  Patient had been followed previous to her cerebrovascular accident by neurology because of changes and abnormalities in gait, weakness and headache.  Patient had an MRI conducted in 2021 prior to her cerebrovascular accident with an interpretive impression of a normal exam with no changes noted prior to previous MRI in 2018.  Patient was  admitted on 06/29/2020 after being found down by her daughter with slurred speech and confusion.  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  Hemorrhage with surrounding edema noted in right frontal lobe area.  Patient was treated on the inpatient rehabilitation unit where I saw her on 07/17/2020.  The patient has continued to have difficulties with memory with the primary memory difficulties appear to be related to retrieval of information rather than an inability to store or organize new information.  The patient has significant variations in expressive language with significant times of profound nonfluent aphasia and other times where she is able to speak much more clearly but continues to have articulation changes from prior to her cerebrovascular event.  The patient also has a significant amount of anxiety and stress associated with her difficulties.  The patient continues to have motor deficits but these were present for the most part prior to this cerebrovascular accident related to her previous or pre-CVA neurological symptoms.   During the clinical interview on 05/30/2022, the patient and her husband both acknowledged changes in speech, memory and vision.  The patient has had review by ophthalmology stating that there was not a lot they could do about her vision changes.  The patient herself reports that she is having difficulty remembering to do things and remembering what was talked about previously.  The patient reports that she is trying to write things down and make lists but then forgets about the list itself.  The patient has worked with speech therapy around strategies to manage  with her memory difficulties and sets alarms on their phones.  The patient reports that it does not work every day and she forgets about events.  Patient reports that she forgets to pass messages along to family members.  Patient describes significant speech  with significant variability in capacity.  The patient reports that at baseline she does not talk like she did before and has an odd accident to her speech.  She reports that in her head that she talks like she has always talked but knows that it comes out differently than she is intending.  The patient reports that she has difficulty finding the words and executing the pronunciation of words and getting grammar correct.  There are other times where her speech becomes almost completely nonfluent.  Patient reports that noise and stress can trigger the symptoms and that others will notice sometimes a tear forming or she becomes very fidgety.  The patient reports that she becomes almost paralyzed and cannot do anything or say anything.  She even reports that there are times when the side of her face will pull.  She reports that these events last between 5 minutes to 1 hour.  Patient described a significant event this past December where she had a significant change in her speech where it went to niger.  Patient reports that in her mind it sounds like she is speaking okay but others hear gibberish words.  Patient reports that she does not feel particularly anxious during these times and sometimes they can happen just out of the blue.  Patient reports that when things are quiet and calm it can be almost normal.  Patient reports that for the first part after her CVA in 2022 that she spent most of her time in her room upstairs in their house where a humidifier was making a white noise.  The patient reports that she did better there but when she would come downstairs with noise and things like TV going that he will completely throw her off.   During the clinical interview on 05/30/2022, the patient's husband reports that she has made significant improvements and that the primary difference for now versus before the stroke are these acute episodes with her speech and expressive language changes.  The patient's  husband reports that there has been some loss of memory capacity but as described by the patient herself this appears to be more having to do with retrieval of information and an inability to store and organize new information.  The patient's husband reports that she is now doing things that initially were hindering her progress.  She is beginning to come down and spend more time with others and doing better the more time she spends with others.  The patient has begun fixing her own meals, dressing herself, keeping up with her medicines etc.  Patient's husband reports that cueing does help her with her memory but that she is constantly worrying that she might forget something or worrying about her speech.  Worry and self observation appear to exacerbate her expressive language.  11/29/2023: The patient reports a recent hospitalization following an acute exacerbation of symptoms. Experienced increased headache after drinking a cold beverage, followed by a two-day period of significant speech difficulty. Presented to the hospital ([facility]), was transferred to [facility]. An MRI was performed, which showed previously identified chronic cerebrovascular event sequela. A CTA was attempted but unsuccessful due to venous access issues. A prolonged EEG is scheduled for the upcoming weekend to investigate  for seizure activity. The new neurologist, Dr. Skeet questioned the use of anti-seizure medication (Keppra ) in the absence of a seizure diagnosis. The patient was initially prescribed Keppra  in the hospital post-stroke to prevent seizures.  Reports increasing difficulty with communication, though notes some improvement with practice. Has been engaging in more community outings, such as going to restaurants, to challenge communication skills. Reports fewer acute episodes of symptom worsening in public. Avoids sitting with others at church, utilizing a private section. Continues to struggle with going to stores. Physical  therapy is ongoing for right arm pain and motor deficits, which has recently worsened.  Patient's current reported difficulties including cognitive difficulties, expressive language issues, aphasia symptoms, vertigo/dizziness, depression, anxiety, post-traumatic stress disorder symptoms, triggers, panic attack frequency and symptoms, and behavioral changes due to fear of panic events. Reports that speech and motor function worsen with overstimulation, such as being in crowds or experiencing stress and worry. Going to the store is particularly intimidating due to anticipatory anxiety about what might happen. Experiences episodes of motor freezing.  Patient's engagement in rehabilitative efforts, specific therapies, and their effectiveness, including any reported agitation or worsening symptoms. Actively participating in physical therapy for the right arm. Engaging in self-directed exposure by going to restaurants more frequently, which has led to increased comfort and improved communication in those settings.     Medical History:                         Past Medical History:  Diagnosis Date   Anxiety     Arthralgia     Generalized weakness      bilateral upper and lower extremities,  walks with cane   GERD (gastroesophageal reflux disease)     Headache(784.0)     Heart murmur      per pt since childhood, no problems   History of kidney stones     Hypertension      followed pcp   IBS (irritable bowel syndrome)     Iron deficiency anemia      12-06-2017 per pt recently had IV iron infusion July 2019   Low vitamin D level     Osteoarthritis      pt unsure about this   PVC (premature ventricular contraction)      per pt had holter monitor   SLE (systemic lupus erythematosus) (HCC)      rheumotologist-- dr syed   Stroke [facility]) 06/2020   Wears glasses                                                                 Patient Active Problem List    Diagnosis Date Noted   Generalized  anxiety disorder 05/05/2021   Biceps tendonitis on left 02/10/2021   Vascular headache 08/05/2020   Hyponatremia 07/28/2020   Headache 07/24/2020   Neck pain, musculoskeletal 07/24/2020   Cognitive deficits     Frontal lobe and executive function deficit     Subarachnoid bleed (HCC) 07/09/2020   Subarachnoid hemorrhage (HCC) 06/29/2020   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HCC) 03/04/2019   S/P hysterectomy 12/13/2017   Abnormal laboratory test 09/09/2016   Gait abnormality 08/03/2016   Chronic fatigue 08/03/2016   Paresthesia 08/03/2016   S/P cervical spinal fusion 10/23/2015  Hypersomnia 12/08/2010   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   EPIGASTRIC PAIN 10/13/2009   IRRITABLE BOWEL SYNDROME 03/10/2009   BACK PAIN, LUMBAR 07/01/2008   COSTOCHONDRITIS 02/01/2008   VIRAL URI 01/08/2008   CHEST PAIN 01/08/2008   OTHER SPECIFIED EPISODIC MOOD DISORDER 12/25/2007   ARM PAIN 12/25/2007   ANEMIA-NOS 10/02/2007   Essential hypertension 10/02/2007   GERD 10/02/2007   POLYP, GALLBLADDER 10/02/2007   UTERINE POLYP 10/02/2007   OSTEOARTHRITIS 10/02/2007   Dyskinesia 10/02/2007   HEADACHE 10/02/2007   PALPITATIONS 10/02/2007   CARDIAC MURMUR, HX OF 10/02/2007   NEPHROLITHIASIS, HX OF 10/02/2007      Onset and Duration of Symptoms: The patient was having neurological issues even before CVA because of changes and abnormalities in gait, weakness and headache.  Expressive language deficits and memory changes developed after her 2022 CVA.   Progression of Symptoms: Patient has made improvements but continues to have acute events with significant profound changes in expressive language.   Triggering Factors: Noise and stress.   Associated Symptoms (e.g., cognitive, emotional, behavioral): Memory retrieval deficits, anxiety and hypervigilance and avoidance behaviors   Additional Tests and Measures from other records:   Neuroimaging Results: Along with previous imaging showing indications of her right frontal  bleed patient also had a follow-up MRA conducted on 11/25/2020.  There was a question about a 2 mm saccular outpouching extending medially and inferiorly from the para thalamic left ICA that may reflect a small aneurysm but the finding was indeterminate.  CT scan conducted on 06/29/2020 did show subarachnoid hemorrhage within the basal cisterns and ventricular system   Current Typical Mood State:  Anxious   Sleep:  The patient describes sleep as having improved more recently.  The patient reports that she is now sleeping through the night since there was an increase in Lexapro .     Diet Pattern:  Patient describes her appetite is good most days.  Treatment Interventions:  Cognitive/behavioral interventions and building coping skills and strategies around residual effects of her cerebrovascular accident including expressive language, motor deficits and abnormalities in gait, weakness and post cerebrovascular accident headaches.  Participation Level:   Active  Participation Quality:  Appropriate      Behavioral Observation:  Well Groomed, Alert, and Appropriate.   Current Psychosocial Factors: Significant stress related to symptom flare-ups and functional limitations. Reports that overstimulation from external (noise, lights, people) or internal (worry, anticipation) sources worsens symptoms, including speech and right-sided motor function. This impacts the ability to participate in activities like grocery shopping.  Content of Session:   Reviewed recent hospitalization and upcoming neurological investigations (EEG, CTA). Extensive psychoeducation was provided regarding the use of anti-seizure medications (e.g., Keppra , Topamax ) as governors for CNS over-activity, not just for seizures. Discussed their role in managing headaches, pain, and mood, and preventing neurological kindling. Explained that symptom worsening with overstimulation is a common post-CVA phenomenon. Discussed the rationale for Dr.  Naaman use of Keppra  to manage this sensitivity. Also discussed the rationale behind the trial reduction and reinstatement of Topamax  for headaches, noting symptom recurrence indicated its efficacy. Explained the mechanism and insurance approval process for newer migraine medications like Aimovig . The concept of systematic desensitization was reviewed, linking it to the patient's success with restaurant outings. An intervention was proposed to apply this strategy to grocery shopping, starting with brief, single-item trips to [employer] to build tolerance and reduce anticipatory anxiety. The session also included a discussion on coping skills, recognizing internal strengths (The Wizard of Oz metaphor), and  focusing on low-hanging fruit for life improvement.  Effectiveness of Interventions: Engaged well with psychoeducation, reporting that the governor analogy for seizure medications made sense. Responded positively to the review of progress with restaurant outings, smiling and agreeing it has been enjoyable. Showed understanding of the systematic desensitization concept and the plan for applying it to grocery shopping.  Target Goals:   Goals include managing post-stroke symptoms, increasing independence in community activities, and addressing anxiety related to symptom exacerbation. Currently managing headaches with a new neurologist (Dr. Skeet), who is coordinating with Dr. Babs. A trial of Aimovig  (70 mg) has been initiated for migraine control after a difficult period following a reduction in the Topamax  dosage, which led to severe headache recurrence. Tylenol  with codeine  was used for breakthrough pain. A prolonged EEG is scheduled to rule out seizure activity. A CTA is planned to further evaluate cerebral vasculature.  Goals Last Reviewed:   11/29/2023  Goals Addressed Today:     Followed up on previous work regarding coping with functional loss and building independence. Addressed anticipatory  anxiety and catastrophizing related to community outings, specifically grocery shopping. Discussed current medication management for headaches (Topamax , Aimovig ) and CNS stabilization (Keppra ). Provided psychoeducation on the rationale for these medications. Introduced a new behavioral goal using systematic desensitization for grocery store visits.  Impression/Diagnosis:   Amy Barry is a 53 year old female referred for neuropsychological/psychological consult due to ongoing issues with cognitive functioning, expressive language functioning and anxiety and stress associated with post cerebrovascular event symptoms.  Patient has a past medical history that is included hypertension, irritable bowel syndrome, anemia, undetermined connective tissue disease disorder and family history of cerebral aneurysms.  Patient had been followed previous to her cerebrovascular accident by neurology because of changes and abnormalities in gait, weakness and headache.  Patient had an MRI conducted in 2021 prior to her cerebrovascular accident with an interpretive impression of a normal exam with no changes noted prior to previous MRI in 2018.  Patient was admitted on 06/29/2020 after being found down by her daughter with slurred speech and confusion.  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  Hemorrhage with surrounding edema noted in right frontal lobe area.  Patient was treated on the inpatient rehabilitation unit where I saw her on 07/17/2020.  The patient has continued to have difficulties with memory with the primary memory difficulties appear to be related to retrieval of information rather than an inability to store or organize new information.  The patient has significant variations in expressive language with significant times of profound nonfluent aphasia and other times where she is able to speak much more clearly but continues to have articulation  changes from prior to her cerebrovascular event.  The patient also has a significant amount of anxiety and stress associated with her difficulties.  The patient continues to have motor deficits but these were present for the most part prior to this cerebrovascular accident related to her previous or pre-CVA neurological symptoms.    Diagnosis:   Generalized anxiety disorder  Frontal lobe and executive function deficit  Subarachnoid hemorrhage (HCC)  Reactive depression  Gait abnormality    Norleen Asa, Psy.D. Clinical Psychologist Neuropsychologist

## 2023-11-30 ENCOUNTER — Ambulatory Visit: Admitting: Physical Therapy

## 2023-11-30 DIAGNOSIS — M542 Cervicalgia: Secondary | ICD-10-CM

## 2023-11-30 DIAGNOSIS — M6281 Muscle weakness (generalized): Secondary | ICD-10-CM | POA: Diagnosis not present

## 2023-11-30 DIAGNOSIS — R2689 Other abnormalities of gait and mobility: Secondary | ICD-10-CM

## 2023-11-30 DIAGNOSIS — R293 Abnormal posture: Secondary | ICD-10-CM | POA: Diagnosis not present

## 2023-11-30 NOTE — Therapy (Signed)
 OUTPATIENT PHYSICAL THERAPY NEURO TREATMENT - DISCHARGE SUMMARY   Patient Name: LEANDRA VANDERWEELE MRN: [id number] DOB:23-Oct-1970, 53 y.o., female Today's Date: 11/30/2023   PCP: Sun, Vyvyan, MD REFERRING PROVIDER: Skeet Juliene SAUNDERS, DO  PHYSICAL THERAPY DISCHARGE SUMMARY  Visits from Start of Care: 11  Current functional level related to goals / functional outcomes: Mod I w/all ADLs without AD    Remaining deficits: Complete disability due to pain per NDI, decreased cervical A/ROM    Education / Equipment: HEP   Patient agrees to discharge. Patient goals were partially met. Patient is being discharged due to maximized rehab potential.     END OF SESSION:  PT End of Session - 11/30/23 0849     Visit Number 11    Number of Visits 13   Plus eval   Date for PT Re-Evaluation 12/08/23    Authorization Type BCBS    Authorization Time Period 10/20/23 - 12/01/23 13 PT visits    PT Start Time 0848    PT Stop Time 0922   DC   PT Time Calculation (min) 34 min    Activity Tolerance Patient tolerated treatment well    Behavior During Therapy WFL for tasks assessed/performed                Past Medical History:  Diagnosis Date   Anxiety    Arthralgia    Generalized weakness    bilateral upper and lower extremities,  walks with cane   GERD (gastroesophageal reflux disease)    Headache(784.0)    Heart murmur    per pt since childhood, no problems   History of kidney stones    Hypertension    followed pcp   IBS (irritable bowel syndrome)    Iron deficiency anemia    12-06-2017 per pt recently had IV iron infusion July 2019   Low vitamin D level    Osteoarthritis    pt unsure about this   PVC (premature ventricular contraction)    per pt had holter monitor   SLE (systemic lupus erythematosus) (HCC)    rheumotologist-- dr syed   Stroke (HCC) 06/2020   Wears glasses    Past Surgical History:  Procedure Laterality Date   CERVICAL DISC ARTHROPLASTY N/A 10/23/2015    Procedure: Cervical Disc Arthroplasty Cervical six-seven;  Surgeon: Alm GORMAN Molt, MD;  Location: [facility];  Service: Neurosurgery;  Laterality: N/A;   D & C HYSTEROSCOPY W/ RESECTION ENDOMETRIAL POLYP  10-13-2003    dr rivard @[facility]    ESOPHAGOGASTRODUODENOSCOPY     for GERD   IR ANGIO INTRA EXTRACRAN SEL COM CAROTID INNOMINATE BILAT MOD SED  07/06/2020   IR ANGIO INTRA EXTRACRAN SEL INTERNAL CAROTID BILAT MOD SED  06/29/2020   IR ANGIO VERTEBRAL SEL VERTEBRAL BILAT MOD SED  06/29/2020   IR ANGIO VERTEBRAL SEL VERTEBRAL BILAT MOD SED  07/06/2020   LAPAROSCOPIC ABDOMINAL EXPLORATION  1996   dx ibs   OVARIAN CYST SURGERY  age 54   x2 cyst   RADIOLOGY WITH ANESTHESIA N/A 06/29/2020   Procedure: IR WITH ANESTHESIA;  Surgeon: Radiologist, Medication, MD;  Location: [facility];  Service: Radiology;  Laterality: N/A;   ROBOTIC ASSISTED TOTAL HYSTERECTOMY N/A 12/13/2017   Procedure: XI ROBOTIC ASSISTED TOTAL HYSTERECTOMYWITH BILATERAL SALPINGECTOMY;  Surgeon: Rosalva Sawyer, MD;  Location: [facility];  Service: Gynecology;  Laterality: N/A;   TUBAL LIGATION Bilateral 06-01-2002   dr henry @[facility]    PPTL   Patient Active Problem List   Diagnosis  Date Noted   Migraine 04/19/2023   Generalized anxiety disorder 05/05/2021   Biceps tendonitis on left 02/10/2021   Vascular headache 08/05/2020   Hyponatremia 07/28/2020   Headache 07/24/2020   Neck pain, musculoskeletal 07/24/2020   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    Subarachnoid bleed (HCC) 07/09/2020   Subarachnoid hemorrhage (HCC) 06/29/2020   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HCC) 03/04/2019   S/P hysterectomy 12/13/2017   Abnormal laboratory test 09/09/2016   Gait abnormality 08/03/2016   Chronic fatigue 08/03/2016   Paresthesia 08/03/2016   S/P cervical spinal fusion 10/23/2015   Hypersomnia 12/08/2010   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   EPIGASTRIC PAIN 10/13/2009   IRRITABLE BOWEL SYNDROME 03/10/2009   BACK PAIN, LUMBAR 07/01/2008    COSTOCHONDRITIS 02/01/2008   VIRAL URI 01/08/2008   CHEST PAIN 01/08/2008   OTHER SPECIFIED EPISODIC MOOD DISORDER 12/25/2007   ARM PAIN 12/25/2007   ANEMIA-NOS 10/02/2007   Essential hypertension 10/02/2007   GERD 10/02/2007   POLYP, GALLBLADDER 10/02/2007   UTERINE POLYP 10/02/2007   OSTEOARTHRITIS 10/02/2007   Dyskinesia 10/02/2007   HEADACHE 10/02/2007   PALPITATIONS 10/02/2007   CARDIAC MURMUR, HX OF 10/02/2007   NEPHROLITHIASIS, HX OF 10/02/2007    ONSET DATE: 10/11/2023 (referral)   REFERRING DIAG: M54.2 (ICD-10-CM) - Neck pain  THERAPY DIAG:  Muscle weakness (generalized)  Cervicalgia  Other abnormalities of gait and mobility  Rationale for Evaluation and Treatment: Rehabilitation  SUBJECTIVE:                                                                                                                                                                                             SUBJECTIVE STATEMENT:  Pt reports her pain is still there but she feels as though PT has been helpful. Exercises are going well. Denies acute changes. Feels ready to DC this date   Pt accompanied by: Husband, Darin   PERTINENT HISTORY: anxiety, GERD, headache, HTN, SLE, CVA and aneurysm with resultant dysarthria (2022),  PAIN:  Are you having pain? Yes: NPRS scale: 4/10  Pain location: R upper trap, Neck, headache  Pain description: Achy, sharp, throbbing  Aggravating factors: Catatonic episodes, anxiety  Relieving factors: None  PRECAUTIONS: Fall  RED FLAGS: None   WEIGHT BEARING RESTRICTIONS: No  FALLS: Has patient fallen in last 6 months? No   PLOF: Needs assistance with ADLs, Needs assistance with homemaking, Needs assistance with gait, and Needs assistance with transfers  PATIENT GOALS: Unable to assess on eval due to emotions and aphasia   OBJECTIVE:  Note: Objective measures were completed at Evaluation unless otherwise noted.  DIAGNOSTIC FINDINGS: From  Dr.  Jayme note on 10/11/23:  10/27/2011 MRI CERVICAL SPINE WO:  Tiny central disc bulges from C2-3 through C4-5 with no significant neural impingement. 06/19/2012 CTA HEAD:  Negative 09/12/2015 MRI CERVICAL SPINE WO:  Progressed cervical spondylosis and degenerative disc disease with left foraminal disc protrusion and nerve impingement at C6-7, moderate impingement at C3-4 and C4-5 and mild impingement at C5-6. 08/03/2016 MRI BRAIN WO:  Unremarkable 08/03/2016 MRI CERVICAL SPINE WO:  Status post arthrodesis at C6-7 with questionable posterior migration of C6-7 implant, mild to moderate canal stenosis with mild cord flattening without definite cord signal changes; moderate degenerative spondylosis with disc protrusions at C3-4, C4-5, and C5-6. 08/04/2016 MRI CERVICAL SPINE WO:  C6-7 arthrodesis now appears in place. 10/06/2016 MRI LUMBAR SPINE WO:  Degenerative disc disease and mild bilateral foraminal narrowing at L5-S1 without significant compression; mild ligamentum flavum hypertrophy and facet degenerative facet changes at L3-4 and L4-5 without compression. 03/04/2019 X-RAY BILATERAL HIP/PELVIS:  Mild degenerative changes both hips.  Otherwise, unremarkable. 05/18/2019 MRI BRAIN W WO:  Normal exam.  No change since 2018. 05/18/2019 MRI C-SPINE W WO:  C3-4: Chronic central disc herniation effaces the ventral subarachnoid space and indents the ventral cord. AP diameter of the canal in the midline only 6.5 mm. Similar appearance to the study of 2018, possibly with minimal enlargement of the disc. C4-5: Chronic central disc herniation effaces the ventral subarachnoid space and indents the ventral cord. AP diameter of the canal in the midline is 8 mm. Similar appearance to the study of 2018, possibly with minimal enlargement of the disc.  C5-6: Mild noncompressive disc bulge.  C6-7: Previous disc arthroplasty. Extensive artifact precludes MR evaluation of the canal or foramina at this level. 06/21/2019 MRI  T-SPINE W WO:  Normal thoracic spine MRI. No finding to explain the patient's symptoms. 06/21/2019 MRI L-SPINE W WO:  Mild degenerative disc disease L3-4 and L5-S1 without central canal or foraminal stenosis. The examination is otherwise negative. No finding to explain the patient's symptoms. 06/29/2020 CT HEAD:  Subarachnoid hemorrhage in the posterior fossa with aneurysmal pattern. There is intraventricular reflux and ventriculomegaly. 06/29/2020 CTA HEAD &  NECK:  No definite source of subarachnoid hemorrhage but there poor visualization of the proximal left PICA with a tiny bulbous area, question ruptured dissection. 07/06/2020 CEREBRAL ANGIOGRAM:  1. No intracranial aneurysms, arteriovenous malformations, or  fistulas are identified. 2. Mild, nonflow limiting vasospasm involving the supraclinoid  segment of the internal carotid arteries bilaterally.  11/25/2020 MRA HEAD WO:  1. Question 2 mm saccular outpouching extending medially and inferiorly from the paraophthalmic left ICA. Finding is indeterminate, but could reflect a small paraophthalmic aneurysm. This is not definitely seen on previous exams. 2. Otherwise normal intracranial MRA. 08/27/2023 MRI BRAIN WO:  1. No acute intracranial abnormality. 2. 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  COGNITION: Overall cognitive status: Impaired   SENSATION: Not tested    POSTURE: rounded shoulders, forward head, and increased thoracic kyphosis/   LOWER EXTREMITY MMT:    MMT Right Eval Left Eval  Hip flexion    Hip extension    Hip abduction    Hip adduction    Hip internal rotation    Hip external rotation    Knee flexion    Knee extension    Ankle dorsiflexion    Ankle plantarflexion    Ankle inversion    Ankle eversion    (Blank rows = not tested)  CERVICAL ROM: tested in seated position  Active ROM A/ROM (deg) eval A/ROM (Deg) 11/23/23  A/ROM (Deg) 11/30/23  Flexion 31 31   Extension 30    Right lateral flexion 34  30  Left lateral flexion 47    Right rotation 67    Left rotation 40 54    (Blank rows = not tested)   PALPATION: TTP along R upper trap, R suboccipitals, R rhomboids and bilateral semispinalis capitis   TRANSFERS: Sit to stand: Modified independence  Assistive device utilized: None     Stand to sit: Modified independence  Assistive device utilized: None      RAMP:  Not tested  CURB:  Not tested  STAIRS: Not tested  GAIT: Gait pattern: WFL Distance walked: Various clinic distances  Assistive device utilized: None Level of assistance: Modified independence Comments: No instability noted    PATIENT SURVEYS:  NDI: to be assessed    VITALS  There were no vitals filed for this visit.                                                                                                                                TREATMENT:   Ther Act - LTG Assessment  SciFit multi-peaks level 6.5 for 8 minutes using BUE/BLEs for neural priming for reciprocal movement, dynamic cardiovascular warmup and increased amplitude of stepping. RPE of 6/10 following activity  Measured pt's R lateral flexion (See chart above) and pt did not improve her mobility compared to eval.   NDI:  NECK DISABILITY INDEX  Date: 11/30/23 Score  Pain intensity 1 = The pain is very mild at the moment  2. Personal care (washing, dressing, etc.) 1 =  I can look after myself normally but it causes extra pain  3. Lifting 3 = Pain prevents me from lifting heavy weights but I can manage light to medium   weights if they are conveniently positioned  4. Reading 1 = I can read as much as I want to with slight pain in my neck  5. Headaches 3 = I have moderate headaches, which come frequently  6. Concentration 2 = I have a fair degree of difficulty in concentrating when I want to  7. Work 4 = I can hardly do any work at all  8. Driving 5 = I  can't drive my car at all  9. Sleeping 0 = I have no trouble sleeping  10. Recreation 3 = I am able to engage in a few of my usual recreation activities because of pain in   my neck  Total 24/50 (complete disability)   Minimum Detectable Change (90% confidence): 5 points or 10% points     PATIENT EDUCATION: Education details: Goal results, importance of continuing HEP  Person educated: Patient and Spouse Education method: Medical illustrator Education comprehension: verbalized understanding and returned demonstration  HOME EXERCISE PROGRAM: Access Code: [email or web address] URL: [email or web address] Date: 10/26/2023 Prepared by: Waddell  Turkalo  Exercises - Seated Upper Trapezius Stretch  - 1 x daily - 7 x weekly - 1 sets - 3-5 reps - 30 sec hold - Gentle Levator Scapulae Stretch  - 1 x daily - 7 x weekly - 1 sets - 3-5 reps - 30 sec hold - Supine Suboccipital Release with Tennis Balls  - 1 x daily - 7 x weekly - 1 sets - 1 reps - 5 min hold - Sternocleidomastoid Stretch  - 1 x daily - 7 x weekly - 1 sets - 3-5 reps - 30 sec hold - Sternocleidomastoid Release  - 1 x daily - 7 x weekly - 1 sets - 1 reps - 5 min hold - Supine Shoulder External Rotation Stretch  - 1 x daily - 7 x weekly - 1 sets - 10 reps - 5-10 sec hold - Supine Thoracic Mobilization Towel Roll Vertical with Arm Stretch  - 1 x daily - 7 x weekly - 3-5 minutes  hold - Hooklying Upper Neck Extension   - 1 x daily - 7 x weekly - 3 sets - 10 reps - Supine Cervical Retraction with Towel  - 1 x daily - 7 x weekly - 3 sets - 10 reps - Standing Bilateral Low Shoulder Row with Anchored Resistance  - 1 x daily - 7 x weekly - 3 sets - 10 reps - 90/90 SI Joint Self-Correction with Dowel  - 1 x daily - 7 x weekly - 1 sets - 5-7 reps - 5 second hold - Sidelying Thoracic Rotation with Open Book  - 1 x daily - 7 x weekly - 3 sets - 10 reps - Seated Shoulder Abduction Towel Slide at Table Top  - 1 x daily - 7 x weekly - 1  sets - 3-5 reps - 30 sec hold  GOALS: Goals reviewed with patient? Yes  SHORT TERM GOALS: Target date: 11/17/2023   Pt will be independent with initial HEP for improved strength and cervical ROM.  Baseline: not established on eval  Goal status: MET  2.  NDI to be assessed and LTG updated  Baseline: 16/45 (7/24) Goal status: MET  3.  Pt will improve cervical flexion A/ROM to >/= 45 degrees for improved functional use of C-spine and reduced pain  Baseline:  Active ROM A/ROM (deg) eval A/ROM (Deg) 11/23/23  Flexion 31 31  Extension 30   Right lateral flexion 34   Left lateral flexion 47   Right rotation 67   Left rotation 40 54   (Blank rows = not tested)  Goal status: NOT MET  4.  Pt will improved L cervical rotation A/ROM to >/= 50 degrees for reduced pain and improved quality of life  Baseline:  Active ROM A/PROM (deg) eval A/ROM (Deg) 11/23/23  Flexion 31 31  Extension 30   Right lateral flexion 34   Left lateral flexion 47   Right rotation 67   Left rotation 40 54   (Blank rows = not tested)  Goal status: MET   LONG TERM GOALS: Target date: 12/01/2023  Pt will improve her score on the NDI to </= 11/50 to demonstrate improved function and decreased disability level Baseline: 16/50 (7/24), moderate disability; 25/50 (8/28) Goal status: NOT MET   2.  Pt will improved R lateral flexion cervical A/ROM to >/= 45 degrees for improved functional use of C-spine and reduced pain  Baseline:  Active ROM A/PROM (deg) eval A/ROM (Deg) 11/23/23 A/ROM (Deg) 11/30/23  Flexion 31 31  Extension 30    Right lateral flexion 34  30  Left lateral flexion 47    Right rotation 67    Left rotation 40 54    (Blank rows = not tested)  Goal status: NOT MET  3.  Pt will be independent with final HEP for improved strength and cervical ROM.  Baseline:  Goal status: MET   ASSESSMENT:  CLINICAL IMPRESSION: Emphasis of skilled PT session on LTG assessment and DC from PT. Pt  reports she is under a lot of stress as she will be on lockdown next week to have EEG performed at home and feels as though she is not resting well due to her medications. However, if she does not take her meds, she cannot speak and is confused. Pt has met 1 of 3 LTGs, reporting independence w/HEP. Pt has not improved her cervical A/ROM much from evaluation and scored worse on NDI. On eval, pt scored a 16/50, indicative of moderate disability. However, pt scored a 24/50 on NDI today, indicative of complete disability despite reporting improvement in pain to therapist. Pt largely affected by lability and stress in PT, impacting her progress, but overall demonstrates reduced fear-avoidance behavior and improved functional ROM despite score on NDI and cervical A/ROM measures. Pt voiced readiness to DC this date and was educated on how to return to PT in future if mobility needs change.     OBJECTIVE IMPAIRMENTS: decreased cognition, decreased knowledge of condition, decreased mobility, decreased ROM, decreased strength, impaired perceived functional ability, impaired flexibility, impaired UE functional use, improper body mechanics, postural dysfunction, and pain  ACTIVITY LIMITATIONS: carrying, lifting, sleeping, transfers, reach over head, locomotion level, and caring for others  PARTICIPATION LIMITATIONS: meal prep, cleaning, laundry, medication management, interpersonal relationship, driving, shopping, community activity, occupation, and yard work  PERSONAL FACTORS: Behavior pattern, Past/current experiences, Time since onset of injury/illness/exacerbation, and 1-2 comorbidities: hx of SAH w/unknown etiology, expressive aphasia are also affecting patient's functional outcome.   REHAB POTENTIAL: Good  CLINICAL DECISION MAKING: Unstable/unpredictable  EVALUATION COMPLEXITY: High  PLAN:  PT FREQUENCY: 2x/week  PT DURATION: 6 weeks  PLANNED INTERVENTIONS: 97164- PT Re-evaluation, 97750-  Physical Performance Testing, 97110-Therapeutic exercises, 97530- Therapeutic activity, 97112- Neuromuscular re-education, 97535- Self Care, 02859- Manual therapy, [phone]- Gait training, [phone]- Aquatic Therapy, [phone] (1-2 muscles), 20561 (3+ muscles)- Dry Needling, Patient/Family education, Balance training, Joint mobilization, Spinal mobilization, and Vestibular training   Shawnique Mariotti E Delos Klich, PT, DPT   11/30/2023, 9:22 AM

## 2023-12-03 DIAGNOSIS — I10 Essential (primary) hypertension: Secondary | ICD-10-CM | POA: Diagnosis not present

## 2023-12-03 DIAGNOSIS — I609 Nontraumatic subarachnoid hemorrhage, unspecified: Secondary | ICD-10-CM | POA: Diagnosis not present

## 2023-12-08 ENCOUNTER — Ambulatory Visit: Admitting: Neurology

## 2023-12-08 DIAGNOSIS — R404 Transient alteration of awareness: Secondary | ICD-10-CM | POA: Diagnosis not present

## 2023-12-08 NOTE — Progress Notes (Signed)
 Ambulatory EEG hooked up and running. Light flashing. Push button tested. Camera and event log explained. Batteries explained. Patient understood.

## 2023-12-11 ENCOUNTER — Encounter: Payer: Self-pay | Admitting: Neurology

## 2023-12-11 DIAGNOSIS — R930 Abnormal findings on diagnostic imaging of skull and head, not elsewhere classified: Secondary | ICD-10-CM

## 2023-12-11 DIAGNOSIS — Z8679 Personal history of other diseases of the circulatory system: Secondary | ICD-10-CM

## 2023-12-11 NOTE — Progress Notes (Unsigned)
 AMB EEG discontinued.  Skin Breakdown:No Diary Returned: Yes

## 2023-12-12 ENCOUNTER — Other Ambulatory Visit: Payer: Self-pay

## 2023-12-12 DIAGNOSIS — I609 Nontraumatic subarachnoid hemorrhage, unspecified: Secondary | ICD-10-CM

## 2023-12-12 NOTE — Telephone Encounter (Signed)
 Patient called for a refill: Please send Methylphenidate  to [facility] on [address].  Thank you.

## 2023-12-13 ENCOUNTER — Telehealth: Payer: Self-pay | Admitting: Neurology

## 2023-12-13 MED ORDER — METHYLPHENIDATE HCL ER (OSM) 36 MG PO TBCR: 36.0000 mg | EXTENDED_RELEASE_TABLET | Freq: Every day | ORAL | 0 refills | Status: DC

## 2023-12-13 NOTE — Progress Notes (Signed)
 ELECTROENCEPHALOGRAM REPORT  Dates of Recording: 12/08/2023 at 09:48:01 to 12/11/2023 at 04:34:28  Patient's Name: Amy Barry MRN: [id number] Date of Birth: 1970/09/11  Procedure: 61-hour ambulatory EEG  History: 53 year old female with history of subarachnoid hemorrhage s/p ventriculostomy with recurrent episodes of transient altered awareness.  Medications:  Current Outpatient Medications on File Prior to Visit  Medication Sig Dispense Refill   acetaminophen  (TYLENOL ) 500 MG tablet Take 500 mg by mouth every 6 (six) hours as needed.     acetaminophen -codeine  (TYLENOL  #3) 300-30 MG tablet Take 1 tablet by mouth every 6 (six) hours as needed. 50 tablet 0   amLODipine  (NORVASC ) 5 MG tablet Take 5 mg by mouth daily.     Erenumab -aooe (AIMOVIG ) 70 MG/ML SOAJ Inject 70 mg into the skin every 28 (twenty-eight) days. 1.12 mL 5   escitalopram  (LEXAPRO ) 20 MG tablet TAKE 1 TABLET BY MOUTH EVERYDAY AT BEDTIME 90 tablet 3   famotidine (PEPCID) 40 MG tablet Take 40 mg by mouth daily.     furosemide  (LASIX ) 40 MG tablet Take 40 mg by mouth daily.     levETIRAcetam  (KEPPRA ) 500 MG tablet TAKE 1 TABLET BY MOUTH TWICE A DAY 180 tablet 2   Menthol -Methyl Salicylate  (MENTHODERM) 10-15 % OINT      methocarbamol  (ROBAXIN ) 500 MG tablet Take 1 tablet (500 mg total) by mouth every 6 (six) hours as needed for muscle spasms. 120 tablet 3   metoprolol  (LOPRESSOR ) 100 MG tablet TAKE ONE TABLET BY MOUTH TWICE DAILY 180 tablet 3   omeprazole  (PRILOSEC) 40 MG capsule daily.     ondansetron  (ZOFRAN ) 4 MG tablet 1 tablet     Probiotic CHEW See admin instructions.     rizatriptan  (MAXALT ) 10 MG tablet Take 1 tablet (10 mg total) by mouth daily as needed for migraine. May repeat in 2 hours if needed 30 tablet 2   saccharomyces boulardii (FLORASTOR) 250 MG capsule Take 1 capsule (250 mg total) by mouth 2 (two) times daily.     topiramate  (TOPAMAX ) 100 MG tablet TAKE 1 TABLET BY MOUTH TWICE A DAY 180 tablet 2    traZODone  (DESYREL ) 50 MG tablet TAKE 1 TABLET BY MOUTH EVERYDAY AT BEDTIME 90 tablet 4   No current facility-administered medications on file prior to visit.    Technical Summary: This is a 61-hour multichannel digital EEG recording measured by the international 10-20 system with electrodes applied with paste and impedances below 5000 ohms performed as portable with EKG monitoring.  The digital EEG was referentially recorded, reformatted, and digitally filtered in a variety of bipolar and referential montages for optimal display.    DESCRIPTION OF RECORDING: During maximal wakefulness, the background activity consisted of a symmetric 8-9Hz  posterior dominant rhythm which was reactive to eye opening.  There were no epileptiform discharges or focal slowing seen in wakefulness.  During the recording, the patient progresses through wakefulness, drowsiness, and Stage 2 sleep.  Again, there were no epileptiform discharges seen.  Events: At approximately 10:30-10:39 on 9/7, patient reported habitual event triggered from the sound of the blender in which she experienced headache and speech disturbance.    There were no electrographic seizures seen.  EKG lead was unremarkable.  IMPRESSION: This 61-hour ambulatory EEG study is normal.    CLINICAL CORRELATION: Above findings indicates the episode recorded is non-epileptic.   Juliene Dunnings, DO

## 2023-12-13 NOTE — Telephone Encounter (Signed)
 EEG reviewed.  It was normal.  The event recorded was not a seizure.  It may be related to anxiety.

## 2023-12-14 ENCOUNTER — Telehealth: Payer: Self-pay | Admitting: Neurology

## 2023-12-14 NOTE — Telephone Encounter (Signed)
 Pt calling bk about EEG results

## 2023-12-14 NOTE — Telephone Encounter (Signed)
 See results notes.

## 2023-12-14 NOTE — Telephone Encounter (Signed)
 Patient advised.

## 2023-12-14 NOTE — Telephone Encounter (Signed)
Tried calling patient no answer. LMOVM to call the office back.

## 2023-12-14 NOTE — Progress Notes (Signed)
 Submit Clinical Request  This member's benefit plan did not require a prior authorization for this request.   Please print and retain a copy of this confirmation in the patient's medical record.   Physician Name: ADAM JAFFE Contact: [facility] Physician Address: [address] [place], [place] [id number] Phone Number: [phone]  Fax Number: [phone] Patient Name: Amy Barry Patient Id: [id number] Insurance Carrier: [employer] Primary Diagnosis Code: Z86.79 Description: Personal history of other diseases of the circulatory system Secondary Diagnosis Code: R93.0 Description: Abnormal findings on diagnostic imaging of skull and head, not elsewhere classified CPT Code 29503 Description: CT ANGIOGRAPHY HEAD Case Number: [id number] Review Date: 12/14/2023 3:40:03 PM Expiration Date: N/A Status: This member's benefit plan did not require a prior authorization for this request.

## 2023-12-14 NOTE — Telephone Encounter (Signed)
 Pt said try her at [phone]

## 2023-12-15 ENCOUNTER — Encounter: Payer: Self-pay | Admitting: Neurology

## 2023-12-20 ENCOUNTER — Encounter: Payer: Self-pay | Admitting: Physical Medicine & Rehabilitation

## 2023-12-20 ENCOUNTER — Encounter: Attending: Physical Medicine & Rehabilitation | Admitting: Physical Medicine & Rehabilitation

## 2023-12-20 VITALS — BP 116/77 | HR 58 | Ht 61.0 in | Wt 162.6 lb

## 2023-12-20 DIAGNOSIS — M7918 Myalgia, other site: Secondary | ICD-10-CM | POA: Diagnosis not present

## 2023-12-20 DIAGNOSIS — R41844 Frontal lobe and executive function deficit: Secondary | ICD-10-CM | POA: Diagnosis present

## 2023-12-20 DIAGNOSIS — G43709 Chronic migraine without aura, not intractable, without status migrainosus: Secondary | ICD-10-CM | POA: Insufficient documentation

## 2023-12-20 DIAGNOSIS — F411 Generalized anxiety disorder: Secondary | ICD-10-CM | POA: Insufficient documentation

## 2023-12-20 DIAGNOSIS — G441 Vascular headache, not elsewhere classified: Secondary | ICD-10-CM | POA: Diagnosis not present

## 2023-12-20 DIAGNOSIS — I609 Nontraumatic subarachnoid hemorrhage, unspecified: Secondary | ICD-10-CM | POA: Insufficient documentation

## 2023-12-20 MED ORDER — AIMOVIG 140 MG/ML ~~LOC~~ SOAJ: 140.0000 mg | SUBCUTANEOUS | 4 refills | Status: DC

## 2023-12-20 MED ORDER — METHOCARBAMOL 500 MG PO TABS: 500.0000 mg | ORAL_TABLET | Freq: Four times a day (QID) | ORAL | 3 refills | Status: AC | PRN

## 2023-12-20 MED ORDER — METHYLPHENIDATE HCL ER (OSM) 36 MG PO TBCR: 36.0000 mg | EXTENDED_RELEASE_TABLET | Freq: Every day | ORAL | 0 refills | Status: DC

## 2023-12-20 MED ORDER — RIZATRIPTAN BENZOATE 10 MG PO TABS: 10.0000 mg | ORAL_TABLET | Freq: Every day | ORAL | 2 refills | Status: AC | PRN

## 2023-12-20 NOTE — Progress Notes (Signed)
 Subjective:    Patient ID: Amy Barry, female    DOB: 03-Sep-1970, 53 y.o.   MRN: [id number]  HPI  Amy Barry is here in follow up of her SAH and associated deficits. She finally was able to start to start on aimovig  this summer which helped her headaches for a few weeks. She toleratd the medication without issue.  Still takes Topamax  as a preventative as well as Maxalt  for breakthrough headaches.    She remains on Concerta  for her attention and focus and this remains effective for her.   She received therapy on her shoulder this summer which helped her trigger points. She has a therapy cane which she likes as well.  She reports dry needling was beneficial.  SHe also has reports intermittent numbness in her 1st and second toe of right foot. It's worse when she sits in the truck/car. The symptoms dissipate when she gets out of the car.  She denies any back or pelvic discomfort.  Amy Barry has followed up with her primary care doctor about her blood pressure and adjustments to her regimen were made as blood pressure was low.    Pain Inventory Average Pain 6 Pain Right Now 7 My pain is constant and dull  In the last 24 hours, has pain interfered with the following? General activity 7 Relation with others 0 Enjoyment of life 0 What TIME of day is your pain at its worst? morning , daytime, evening, and night Sleep (in general) Good  Pain is worse with: unsure Pain improves with: rest and sleep Relief from Meds: 4  Family History  Problem Relation Age of Onset   Migraines Mother    Hypertension Mother        hx aneursym and surgery   Atrial fibrillation Mother    Sarcoidosis Mother    Anuerysm Mother        brain   Arthritis Mother    Healthy Father    Asthma Sister    Leukemia Maternal Grandmother    Heart disease Maternal Grandmother    Stroke Paternal Grandmother    Hypertension Daughter    Colon polyps Daughter 9       hermatoma oversized polyps-benign   Hypertension  Daughter    Hyperlipidemia Other    Colon cancer Neg Hx    Social History   Socioeconomic History   Marital status: Married    Spouse name: Not on file   Number of children: 2   Years of education: College   Highest education level: Not on file  Occupational History   Occupation: Teacher, adult education: Advertising copywriter  Tobacco Use   Smoking status: Never   Smokeless tobacco: Never  Vaping Use   Vaping status: Never Used  Substance and Sexual Activity   Alcohol use: No    Alcohol/week: 0.0 standard drinks of alcohol   Drug use: Never   Sexual activity: Yes    Partners: Male    Birth control/protection: Surgical  Other Topics Concern   Not on file  Social History Narrative   Lives at home with husband.   Right-handed.   Occasional caffeine use.   Social Drivers of Corporate investment banker Strain: Not on file  Food Insecurity: Not on file  Transportation Needs: Not on file  Physical Activity: Not on file  Stress: Not on file  Social Connections: Not on file   Past Surgical History:  Procedure Laterality Date   CERVICAL DISC ARTHROPLASTY N/A 10/23/2015  Procedure: Cervical Disc Arthroplasty Cervical six-seven;  Surgeon: Alm GORMAN Molt, MD;  Location: [facility];  Service: Neurosurgery;  Laterality: N/A;   D & C HYSTEROSCOPY W/ RESECTION ENDOMETRIAL POLYP  10-13-2003    dr rivard @[facility]    ESOPHAGOGASTRODUODENOSCOPY     for GERD   IR ANGIO INTRA EXTRACRAN SEL COM CAROTID INNOMINATE BILAT MOD SED  07/06/2020   IR ANGIO INTRA EXTRACRAN SEL INTERNAL CAROTID BILAT MOD SED  06/29/2020   IR ANGIO VERTEBRAL SEL VERTEBRAL BILAT MOD SED  06/29/2020   IR ANGIO VERTEBRAL SEL VERTEBRAL BILAT MOD SED  07/06/2020   LAPAROSCOPIC ABDOMINAL EXPLORATION  1996   dx ibs   OVARIAN CYST SURGERY  age 89   x2 cyst   RADIOLOGY WITH ANESTHESIA N/A 06/29/2020   Procedure: IR WITH ANESTHESIA;  Surgeon: Radiologist, Medication, MD;  Location: [facility];  Service: Radiology;  Laterality: N/A;   ROBOTIC  ASSISTED TOTAL HYSTERECTOMY N/A 12/13/2017   Procedure: XI ROBOTIC ASSISTED TOTAL HYSTERECTOMYWITH BILATERAL SALPINGECTOMY;  Surgeon: Rosalva Sawyer, MD;  Location: [facility];  Service: Gynecology;  Laterality: N/A;   TUBAL LIGATION Bilateral 06-01-2002   dr henry @[facility]    [facility]   Past Surgical History:  Procedure Laterality Date   CERVICAL DISC ARTHROPLASTY N/A 10/23/2015   Procedure: Cervical Disc Arthroplasty Cervical six-seven;  Surgeon: Alm GORMAN Molt, MD;  Location: [facility];  Service: Neurosurgery;  Laterality: N/A;   D & C HYSTEROSCOPY W/ RESECTION ENDOMETRIAL POLYP  10-13-2003    dr rivard @[facility]    ESOPHAGOGASTRODUODENOSCOPY     for GERD   IR ANGIO INTRA EXTRACRAN SEL COM CAROTID INNOMINATE BILAT MOD SED  07/06/2020   IR ANGIO INTRA EXTRACRAN SEL INTERNAL CAROTID BILAT MOD SED  06/29/2020   IR ANGIO VERTEBRAL SEL VERTEBRAL BILAT MOD SED  06/29/2020   IR ANGIO VERTEBRAL SEL VERTEBRAL BILAT MOD SED  07/06/2020   LAPAROSCOPIC ABDOMINAL EXPLORATION  1996   dx ibs   OVARIAN CYST SURGERY  age 41   x2 cyst   RADIOLOGY WITH ANESTHESIA N/A 06/29/2020   Procedure: IR WITH ANESTHESIA;  Surgeon: Radiologist, Medication, MD;  Location: [facility];  Service: Radiology;  Laterality: N/A;   ROBOTIC ASSISTED TOTAL HYSTERECTOMY N/A 12/13/2017   Procedure: XI ROBOTIC ASSISTED TOTAL HYSTERECTOMYWITH BILATERAL SALPINGECTOMY;  Surgeon: Rosalva Sawyer, MD;  Location: [facility];  Service: Gynecology;  Laterality: N/A;   TUBAL LIGATION Bilateral 06-01-2002   dr henry @[facility]    [facility]   Past Medical History:  Diagnosis Date   Anxiety    Arthralgia    Generalized weakness    bilateral upper and lower extremities,  walks with cane   GERD (gastroesophageal reflux disease)    Headache(784.0)    Heart murmur    per pt since childhood, no problems   History of kidney stones    Hypertension    followed pcp   IBS (irritable bowel syndrome)    Iron deficiency anemia    12-06-2017 per pt recently had IV iron infusion July 2019   Low  vitamin D level    Osteoarthritis    pt unsure about this   PVC (premature ventricular contraction)    per pt had holter monitor   SLE (systemic lupus erythematosus) (HCC)    rheumotologist-- dr syed   Stroke (HCC) 06/2020   Wears glasses    Ht 5' 1 (1.549 m)   BMI 30.99 kg/m   Opioid Risk Score:   Fall Risk Score:  `1  Depression screen PHQ  2/9     12/20/2023   10:04 AM 08/23/2023   10:01 AM 04/19/2023   10:07 AM 12/14/2022   10:19 AM 08/10/2022   10:25 AM 01/19/2022   11:40 AM 11/10/2021    4:02 PM  Depression screen PHQ 2/9  Decreased Interest 0 0 0 0 0 0 0  Down, Depressed, Hopeless 0 0 0 0 0 0 0  PHQ - 2 Score 0 0 0 0 0 0 0    Review of Systems  Neurological:  Positive for headaches.  All other systems reviewed and are negative.      Objective:   Physical Exam  General: No acute distress HEENT: NCAT, EOMI, oral membranes moist Cards: reg rate  Chest: normal effort Abdomen: Soft, NT, ND Skin: dry, intact Extremities: no edema Psych: pleasant and appropriate  Neuro:   Alert and oriented x 3.    Cranial nerve exam unremarkable. MMT: 5/5 in all 4's. Fair but wide based gait. Normal strength.    Still with baby talk. More initiating and on point today. Good memory Musc:  she has normal range of motion of he lumbar spine and LE's.  Straight leg maneuver only produced hamstring discomfort         Assessment & Plan:  1.  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             -continue with HEP and social reintegration---needs to be out more                        -has backed off some of exercise d/t h/a  - Not sure what to make of her right sided sensory loss, particularly in the right lower extremity.  She had a recent MRI which was clean.  She has no back pain.  Advised patient to continue to observe for now.  Can continue working on stretches as therapy as  described. 2.  Left shoulder pain, bicipital tendonitis:              - appears improved.   -developed pain in her right shoulder/shoulder girdle--better with PT -therapy cane, moist heat, posture.    -consider trigger points for right trap at next visit        3.  Seizure prophylaxis: keppra  500mg  bid---  4.  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  Migraine/vascular headaches---better             -topamax  100mg  bid--continue for now             -aimovig  injection increase to 140mg  monthly-              -Maxalt -will try 10mg  prn for h/a              -anxiety component still a major factor             -relaxation/rest when needed                             6. Anxiety and depression, fatigue/sleep             -maintain concerta  at 36mg  daily--no rf needed today             -lexapro - maintain at 20mg  qhs-              -regular sleep > Continue trazodone              -  family/social support remains solid             - Discussed socialization.  She is making an attempt and doing better with this.  She should keep it up.  Have some escape plans in place when she is out with others as well.             -neuropsych input of. Dr. Darden appreciated-->f/u scheduled this fall          melatonin 10mg    7. Dizziness, soft bp's             - PCP is monitoring.  Blood pressures have been better.                  20  minutes of face to face patient care time were spent during this visit. All questions were encouraged and answered.  Follow up with me in 3 months      Assessment & Plan:

## 2023-12-20 NOTE — Patient Instructions (Addendum)
 ALWAYS FEEL FREE TO CALL OUR OFFICE WITH ANY PROBLEMS OR QUESTIONS [phone])  **PLEASE NOTE** ALL MEDICATION REFILL REQUESTS (INCLUDING CONTROLLED SUBSTANCES) NEED TO BE MADE AT LEAST 7 DAYS PRIOR TO REFILL BEING DUE. ANY REFILL REQUESTS INSIDE THAT TIME FRAME MAY RESULT IN DELAYS IN RECEIVING YOUR PRESCRIPTION.    CONTINUE TO WORK ON YOUR POSTURE AND RANGE OF MOTION IN YOUR NECK AND SHOULDER.   ?CUSHION FOR WHEN YOU SIT.  CONTINUE STRETCHING YOUR LEGS AND PELVIS AS YOU ARE.

## 2023-12-22 ENCOUNTER — Ambulatory Visit (HOSPITAL_COMMUNITY)
Admission: RE | Admit: 2023-12-22 | Discharge: 2023-12-22 | Disposition: A | Discharge status: Home / Self Care | Source: Ambulatory Visit | Attending: Neurology | Admitting: Neurology

## 2023-12-22 DIAGNOSIS — Z8679 Personal history of other diseases of the circulatory system: Secondary | ICD-10-CM | POA: Insufficient documentation

## 2023-12-22 DIAGNOSIS — R930 Abnormal findings on diagnostic imaging of skull and head, not elsewhere classified: Secondary | ICD-10-CM | POA: Diagnosis not present

## 2023-12-22 MED ORDER — IOHEXOL 350 MG/ML SOLN
75.0000 mL | Freq: Once | INTRAVENOUS | Status: AC | PRN
  Administered 2023-12-22: 75 mL via INTRAVENOUS

## 2023-12-26 ENCOUNTER — Ambulatory Visit: Payer: Self-pay | Admitting: Neurology

## 2023-12-26 NOTE — Progress Notes (Signed)
 I left message to call on CTA results.

## 2023-12-28 NOTE — Telephone Encounter (Signed)
 Pt is returning call to discuss her results. Thaks

## 2023-12-29 NOTE — Progress Notes (Signed)
 Patient advised.

## 2024-01-02 DIAGNOSIS — I609 Nontraumatic subarachnoid hemorrhage, unspecified: Secondary | ICD-10-CM | POA: Diagnosis not present

## 2024-01-02 DIAGNOSIS — I10 Essential (primary) hypertension: Secondary | ICD-10-CM | POA: Diagnosis not present

## 2024-01-15 ENCOUNTER — Ambulatory Visit: Admitting: Neurology

## 2024-01-22 ENCOUNTER — Other Ambulatory Visit: Payer: Self-pay

## 2024-01-22 DIAGNOSIS — I609 Nontraumatic subarachnoid hemorrhage, unspecified: Secondary | ICD-10-CM

## 2024-01-23 MED ORDER — METHYLPHENIDATE HCL ER (OSM) 36 MG PO TBCR: 36.0000 mg | EXTENDED_RELEASE_TABLET | Freq: Every day | ORAL | 0 refills | Status: DC

## 2024-02-15 ENCOUNTER — Other Ambulatory Visit (HOSPITAL_BASED_OUTPATIENT_CLINIC_OR_DEPARTMENT_OTHER): Payer: Self-pay | Admitting: Family Medicine

## 2024-02-15 ENCOUNTER — Encounter: Payer: Self-pay | Admitting: *Deleted

## 2024-02-15 DIAGNOSIS — I10 Essential (primary) hypertension: Secondary | ICD-10-CM

## 2024-02-15 DIAGNOSIS — E782 Mixed hyperlipidemia: Secondary | ICD-10-CM

## 2024-02-22 ENCOUNTER — Encounter: Payer: Self-pay | Admitting: Neurology

## 2024-03-04 ENCOUNTER — Ambulatory Visit (HOSPITAL_BASED_OUTPATIENT_CLINIC_OR_DEPARTMENT_OTHER)
Admission: RE | Admit: 2024-03-04 | Discharge: 2024-03-04 | Disposition: A | Payer: Self-pay | Source: Ambulatory Visit | Attending: Family Medicine | Admitting: Family Medicine

## 2024-03-04 DIAGNOSIS — E782 Mixed hyperlipidemia: Secondary | ICD-10-CM | POA: Insufficient documentation

## 2024-03-04 DIAGNOSIS — I10 Essential (primary) hypertension: Secondary | ICD-10-CM | POA: Insufficient documentation

## 2024-03-13 ENCOUNTER — Telehealth: Payer: Self-pay | Admitting: *Deleted

## 2024-03-13 DIAGNOSIS — I609 Nontraumatic subarachnoid hemorrhage, unspecified: Secondary | ICD-10-CM

## 2024-03-13 MED ORDER — METHYLPHENIDATE HCL ER (OSM) 36 MG PO TBCR: 36.0000 mg | EXTENDED_RELEASE_TABLET | Freq: Every day | ORAL | 0 refills | Status: DC

## 2024-03-13 NOTE — Telephone Encounter (Signed)
 Rx sent.

## 2024-03-13 NOTE — Telephone Encounter (Signed)
 Mrs Maciolek called for a refill on her methylphenidate .

## 2024-03-20 ENCOUNTER — Encounter: Payer: Self-pay | Admitting: Physical Medicine & Rehabilitation

## 2024-03-20 ENCOUNTER — Encounter: Attending: Physical Medicine & Rehabilitation | Admitting: Physical Medicine & Rehabilitation

## 2024-03-20 VITALS — BP 117/74 | HR 61 | Ht 61.0 in | Wt 161.6 lb

## 2024-03-20 DIAGNOSIS — R41844 Frontal lobe and executive function deficit: Secondary | ICD-10-CM | POA: Diagnosis not present

## 2024-03-20 DIAGNOSIS — G441 Vascular headache, not elsewhere classified: Secondary | ICD-10-CM | POA: Diagnosis not present

## 2024-03-20 DIAGNOSIS — I609 Nontraumatic subarachnoid hemorrhage, unspecified: Secondary | ICD-10-CM | POA: Diagnosis not present

## 2024-03-20 DIAGNOSIS — G43709 Chronic migraine without aura, not intractable, without status migrainosus: Secondary | ICD-10-CM | POA: Insufficient documentation

## 2024-03-20 MED ORDER — METHYLPHENIDATE HCL ER (OSM) 36 MG PO TBCR: 36.0000 mg | EXTENDED_RELEASE_TABLET | Freq: Every day | ORAL | 0 refills | Status: DC

## 2024-03-20 MED ORDER — AIMOVIG 140 MG/ML ~~LOC~~ SOAJ: 140.0000 mg | SUBCUTANEOUS | 4 refills | Status: AC

## 2024-03-20 NOTE — Progress Notes (Signed)
 Subjective:    Patient ID: Amy Barry, female    DOB: 05-May-1970, 53 y.o.   MRN: [id number]  HPI   Discussed the use of AI scribe software for clinical note transcription with the patient, who gave verbal consent to proceed.  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  Headache characteristics and trajectory - Daily headaches, typically dull for the first 2-3 weeks following monthly Aimovig  injection - Marked increase in headache severity during the last week and a half of the Aimovig  dosing cycle - Headaches localized to the occipital region with radiation to the frontal areas, sometimes bilaterally - Severe episodes associated with cognitive fogginess, speech difficulties, and photophobia - Severe headaches often require rest in a dark room and sleep for relief  Headache management and medication response - Currently on Aimovig  140 mg monthly; last injection on March 01, 2024 - Uses Maxalt  for breakthrough headaches, especially toward the end of the Aimovig  cycle - Maxalt  is effective when taken early in the course of symptoms - Prefers to avoid medication unless necessary but acknowledges early intervention with Maxalt  is more beneficial  Cognitive and neurological symptoms - Cognitive fogginess and speech difficulties occur during severe headache episodes - No new or abrupt neurological deficits - Episodes of significant weakness over the past few days, attributed to headaches but etiology uncertain  Sleep disturbance - Difficulty maintaining a consistent sleep schedule - Able to sleep when taking prescribed medications (trazodone  at night, Lexapro  in the evening) - Sleep quality and timing remain irregular - Sometimes wakes feeling unrested and has difficulty falling asleep despite medication - Advised to add melatonin  10 mg nightly to improve sleep quality  Mood and affect - No current symptoms of depression or anxiety - Feels fine and family would agree  Physical activity and socialization - Continues to engage in socialization and light physical activity - Attends church and plans to begin walking with her daughter  Vascular health and blood pressure monitoring - Recent CT and calcium  scoring revealed aortic plaque - Monitors blood pressure at home with normal readings - Not currently taking antihypertensive medication    Pain Inventory Average Pain 7 Pain Right Now 6 My pain is constant, sharp, and aching  In the last 24 hours, has pain interfered with the following? General activity 4 Relation with others 4 Enjoyment of life 4 What TIME of day is your pain at its worst? morning  and varies Sleep (in general) Fair  Pain is worse with: sleeping on right side Pain improves with: heat/ice, therapy/exercise, and medication Relief from Meds: a little  Family History  Problem Relation Age of Onset   Migraines Mother    Hypertension Mother        hx aneursym and surgery   Atrial fibrillation Mother    Sarcoidosis Mother    Anuerysm Mother        brain   Arthritis Mother    Healthy Father    Asthma Sister    Leukemia Maternal Grandmother    Heart disease Maternal Grandmother    Stroke Paternal Grandmother    Hypertension Daughter    Colon polyps Daughter 9       hermatoma oversized polyps-benign   Hypertension Daughter    Hyperlipidemia Other    Colon cancer Neg Hx    Social History   Socioeconomic History  Marital status: Married    Spouse name: Not on file   Number of children: 2   Years of education: College   Highest education level: Not on file  Occupational History   Occupation: Teacher, Adult Education: ADVERTISING COPYWRITER  Tobacco Use   Smoking status: Never   Smokeless tobacco: Never  Vaping Use   Vaping status: Never Used  Substance and Sexual Activity   Alcohol  use: No    Alcohol/week: 0.0 standard drinks of alcohol   Drug use: Never   Sexual activity: Yes    Partners: Male    Birth control/protection: Surgical  Other Topics Concern   Not on file  Social History Narrative   Lives at home with husband.   Right-handed.   Occasional caffeine use.   Social Drivers of Health   Tobacco Use: Low Risk (03/20/2024)   Patient History    Smoking Tobacco Use: Never    Smokeless Tobacco Use: Never    Passive Exposure: Not on file  Financial Resource Strain: Not on file  Food Insecurity: Not on file  Transportation Needs: Not on file  Physical Activity: Not on file  Stress: Not on file  Social Connections: Not on file  Depression (PHQ2-9): Low Risk (12/20/2023)   Depression (PHQ2-9)    PHQ-2 Score: 0  Alcohol Screen: Not on file  Housing: Not on file  Utilities: Not on file  Health Literacy: Not on file   Past Surgical History:  Procedure Laterality Date   CERVICAL DISC ARTHROPLASTY N/A 10/23/2015   Procedure: Cervical Disc Arthroplasty Cervical six-seven;  Surgeon: Alm GORMAN Molt, MD;  Location: [facility];  Service: Neurosurgery;  Laterality: N/A;   D & C HYSTEROSCOPY W/ RESECTION ENDOMETRIAL POLYP  10-13-2003    dr rivard @[facility]    ESOPHAGOGASTRODUODENOSCOPY     for GERD   IR ANGIO INTRA EXTRACRAN SEL COM CAROTID INNOMINATE BILAT MOD SED  07/06/2020   IR ANGIO INTRA EXTRACRAN SEL INTERNAL CAROTID BILAT MOD SED  06/29/2020   IR ANGIO VERTEBRAL SEL VERTEBRAL BILAT MOD SED  06/29/2020   IR ANGIO VERTEBRAL SEL VERTEBRAL BILAT MOD SED  07/06/2020   LAPAROSCOPIC ABDOMINAL EXPLORATION  1996   dx ibs   OVARIAN CYST SURGERY  age 44   x2 cyst   RADIOLOGY WITH ANESTHESIA N/A 06/29/2020   Procedure: IR WITH ANESTHESIA;  Surgeon: Radiologist, Medication, MD;  Location: [facility];  Service: Radiology;  Laterality: N/A;   ROBOTIC ASSISTED TOTAL HYSTERECTOMY N/A 12/13/2017   Procedure: XI ROBOTIC ASSISTED TOTAL HYSTERECTOMYWITH BILATERAL SALPINGECTOMY;  Surgeon:  Rosalva Sawyer, MD;  Location: [facility];  Service: Gynecology;  Laterality: N/A;   TUBAL LIGATION Bilateral 06-01-2002   dr henry @[facility]    [facility]   Past Surgical History:  Procedure Laterality Date   CERVICAL DISC ARTHROPLASTY N/A 10/23/2015   Procedure: Cervical Disc Arthroplasty Cervical six-seven;  Surgeon: Alm GORMAN Molt, MD;  Location: [facility];  Service: Neurosurgery;  Laterality: N/A;   D & C HYSTEROSCOPY W/ RESECTION ENDOMETRIAL POLYP  10-13-2003    dr rivard @[facility]    ESOPHAGOGASTRODUODENOSCOPY     for GERD   IR ANGIO INTRA EXTRACRAN SEL COM CAROTID INNOMINATE BILAT MOD SED  07/06/2020   IR ANGIO INTRA EXTRACRAN SEL INTERNAL CAROTID BILAT MOD SED  06/29/2020   IR ANGIO VERTEBRAL SEL VERTEBRAL BILAT MOD SED  06/29/2020   IR ANGIO VERTEBRAL SEL VERTEBRAL BILAT MOD SED  07/06/2020   LAPAROSCOPIC ABDOMINAL EXPLORATION  1996   dx ibs  OVARIAN CYST SURGERY  age 55   x2 cyst   RADIOLOGY WITH ANESTHESIA N/A 06/29/2020   Procedure: IR WITH ANESTHESIA;  Surgeon: Radiologist, Medication, MD;  Location: [facility];  Service: Radiology;  Laterality: N/A;   ROBOTIC ASSISTED TOTAL HYSTERECTOMY N/A 12/13/2017   Procedure: XI ROBOTIC ASSISTED TOTAL HYSTERECTOMYWITH BILATERAL SALPINGECTOMY;  Surgeon: Rosalva Sawyer, MD;  Location: [facility];  Service: Gynecology;  Laterality: N/A;   TUBAL LIGATION Bilateral 06-01-2002   dr henry @[facility]    [facility]   Past Medical History:  Diagnosis Date   Anxiety    Arthralgia    Generalized weakness    bilateral upper and lower extremities,  walks with cane   GERD (gastroesophageal reflux disease)    Headache(784.0)    Heart murmur    per pt since childhood, no problems   History of kidney stones    Hypertension    followed pcp   IBS (irritable bowel syndrome)    Iron deficiency anemia    12-06-2017 per pt recently had IV iron infusion July 2019   Low vitamin D level    Osteoarthritis    pt unsure about this   PVC (premature ventricular contraction)    per pt had holter monitor    SLE (systemic lupus erythematosus) (HCC)    rheumotologist-- dr syed   Stroke [facility]) 06/2020   Wears glasses    BP 117/74 (BP Location: Left Arm, Patient Position: Sitting, Cuff Size: Normal)   Pulse 61   Ht 5' 1 (1.549 m)   Wt 161 lb 9.6 oz (73.3 kg)   SpO2 96%   BMI 30.53 kg/m   Opioid Risk Score:   Fall Risk Score:  `1  Depression screen [facility] 2/9     12/20/2023   10:04 AM 08/23/2023   10:01 AM 04/19/2023   10:07 AM 12/14/2022   10:19 AM 08/10/2022   10:25 AM 01/19/2022   11:40 AM 11/10/2021    4:02 PM  Depression screen PHQ 2/9  Decreased Interest 0 0 0 0 0 0 0  Down, Depressed, Hopeless 0 0 0 0 0 0 0  PHQ - 2 Score 0 0 0 0 0 0 0      Review of Systems  Musculoskeletal:  Positive for arthralgias and myalgias.       Right shoulder pain, headaches  Neurological:  Positive for headaches.  All other systems reviewed and are negative.      Objective:   General: No acute distress HEENT: NCAT, EOMI, oral membranes moist Cards: reg rate  Chest: normal effort Abdomen: Soft, NT, ND Skin: dry, intact Extremities: no edema Psych: pleasant and appropriate  Neuro:   Alert and oriented x 3.    Cranial nerve exam unremarkable. MMT: 5/5 in all 4's. Fair but wide based gait. Normal strength.    Still with baby talk. More initiating and on point today. Good memory Musc:  she has normal range of motion of he lumbar spine and LE's.  Straight leg maneuver only produced hamstring discomfort         Assessment & Plan:  1.  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             -continue with HEP and social reintegration---needs to be out more                        - discussed again today!  -  increase walking  2.  Left shoulder pain, bicipital tendonitis:              - appears improved.   -developed pain in her right shoulder/shoulder girdle--better with PT -therapy cane,  moist heat, posture.    -        3.  Seizure prophylaxis: keppra  500mg  bid---  4.  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  Migraine/vascular headaches---better             -topamax  100mg  at bedtime? ---can try breaking in 1/2 to 50mg  at bedtime.              -aimovig  injection  140mg  monthly helping a lot but not getting the full coverage over the month.    -consider vyepti trial. Provided brochure             -Maxalt -  10mg  prn for h/a              -anxiety component still a major factor             -relaxation/rest when needed             -recommended following up with primary regarding aortic athero                6. Anxiety and depression, fatigue/sleep--mood has generally been better             -maintain concerta  at 36mg  daily--helps her stay active, focus   =needs to make sure she's taking before 10             -lexapro - maintain at 20mg  qhs-              -regular sleep > sleep hygiene needs to be a priority  -Continue trazodone  50mg  and melatonin 10mg              -family/social support remains               - continue with attempts at socialization as above             -neuropsych input of. Dr. Darden appreciated-->f/u scheduled this fall           7. Dizziness, soft bp's             - PCP is monitoring.  Blood pressures have been better.    Twenty minutes of face to face patient care time were spent during this visit. All questions were encouraged and answered.  Follow up with me in 4 mos .

## 2024-03-20 NOTE — Patient Instructions (Signed)
°  VISIT SUMMARY: Today, we discussed your chronic migraines and the associated symptoms, including cognitive fogginess and speech difficulties. We reviewed your current medication regimen and made some adjustments to improve your headache management and sleep quality. We also talked about your overall mood, physical activity, and vascular health.  YOUR PLAN: -CHRONIC MIGRAINE AND VASCULAR HEADACHE: Chronic migraines are severe headaches that occur frequently and can be debilitating. We will continue with your Aimovig  140 mg monthly injections and use Maxalt  at the onset of headaches for relief. We also discussed the possibility of future CGRP infusion therapies if Aimovig  becomes less effective. Additionally, we will reduce your Topamax  to one pill in the evening with plans to discontinue it. Consistent sleep hygiene is important for managing your headaches.  -HISTORY OF NONTRAUMATIC SUBARACHNOID HEMORRHAGE WITH COGNITIVE SEQUELAE: A subarachnoid hemorrhage is bleeding in the space around the brain, which can lead to long-term cognitive issues. It's important to continue socializing and engaging in light physical activities, such as walking with your daughter, to support your cognitive health.  -DEPRESSION AND ANXIETY WITH SLEEP DISTURBANCE: Your mood is stable, but you continue to have trouble sleeping. We reviewed the timing of your medications and emphasized the importance of good sleep habits. You should take Lexapro  in the evening and melatonin 10 mg at night. If you wake up after 10 a.m., skip the Concerta  dose for that day. Prioritizing a consistent sleep schedule and medication timing will help improve your sleep quality.  INSTRUCTIONS: Please follow up with us  to verify the refill status of your Maxalt  prescription. Continue monitoring your blood pressure at home and maintain your current lifestyle habits. If you experience any new or worsening symptoms, contact our office  immediately.

## 2024-03-22 ENCOUNTER — Other Ambulatory Visit: Payer: Self-pay | Admitting: Physical Medicine & Rehabilitation

## 2024-03-22 DIAGNOSIS — I609 Nontraumatic subarachnoid hemorrhage, unspecified: Secondary | ICD-10-CM

## 2024-03-26 NOTE — Telephone Encounter (Signed)
 PA needed Aimovig

## 2024-04-09 ENCOUNTER — Ambulatory Visit: Admitting: Psychology

## 2024-04-17 ENCOUNTER — Telehealth: Payer: Self-pay | Admitting: *Deleted

## 2024-04-17 NOTE — Telephone Encounter (Signed)
 Dr Babs wanted her to stop the topiramate . When she tried to stop it her bo shot up and she was dizzy and her head hurt so she had t go back on it. She needs assistance with a safe wean down dose.

## 2024-04-18 NOTE — Telephone Encounter (Signed)
 Notified to stay on the dose (100 mg). She agrees.

## 2024-04-25 ENCOUNTER — Other Ambulatory Visit: Payer: Self-pay | Admitting: Physical Medicine & Rehabilitation

## 2024-04-25 DIAGNOSIS — F411 Generalized anxiety disorder: Secondary | ICD-10-CM

## 2024-04-25 DIAGNOSIS — F329 Major depressive disorder, single episode, unspecified: Secondary | ICD-10-CM

## 2024-04-26 NOTE — Telephone Encounter (Signed)
 Requested Prescriptions   Pending Prescriptions Disp Refills   escitalopram  (LEXAPRO ) 20 MG tablet [Pharmacy Med Name: ESCITALOPRAM  20 MG TABLET] 90 tablet 3    Sig: TAKE 1 TABLET BY MOUTH EVERYDAY AT BEDTIME     Date of patient request: 04/25/2024 Last office visit: 03/20/2024 Upcoming visit: 06/19/2024 Date of last refill: 04/19/2023  Last refill amount: #90 three refills

## 2024-05-02 ENCOUNTER — Other Ambulatory Visit: Payer: Self-pay

## 2024-05-02 DIAGNOSIS — I609 Nontraumatic subarachnoid hemorrhage, unspecified: Secondary | ICD-10-CM

## 2024-05-02 NOTE — Telephone Encounter (Signed)
 Please refill of Methylphenidate  ER  to [facility] on [address].  Last visit 03/20/2024, next visit 06/19/2024. Thank you.    Filled  Written  ID  Drug  QTY  Days  Prescriber  RX #  Dispenser  Refill  Daily Dose*  Pymt Type  PMP  03/13/2024 03/13/2024 2  Methylphenidate  Er 36 Mg Tab 30.00 30 Za Swa [id number] Nor ([id number]) 0/0  Medicare [facility] 01/23/2024 01/23/2024 2  Methylphenidate  Er 36 Mg Tab 30.00 30 Za Swa [id number] Nor ([id number]) 0/0  Medicare [place] 12/13/2023 12/13/2023 2  Methylphenidate  Er 36 Mg Tab/30.00 30 Za Swa [id number] Nor ([id number]) 0/0  Medicare/ [place]

## 2024-05-03 MED ORDER — METHYLPHENIDATE HCL ER (OSM) 36 MG PO TBCR: 36.0000 mg | EXTENDED_RELEASE_TABLET | Freq: Every day | ORAL | 0 refills | Status: AC

## 2024-05-08 ENCOUNTER — Telehealth: Payer: Self-pay | Admitting: Neurology

## 2024-05-08 NOTE — Telephone Encounter (Signed)
 Amy Barry called and lvm stating that she called to speak with the nurse. She explained to the nurse on call about her headaches and her medicine that she has been taking. On call told her that she needed to be seen within 3 days.  PH: Didn't leave

## 2024-05-08 NOTE — Telephone Encounter (Signed)
 How frequent or the headaches (on average, how many days a week/month are they occurring)?  Consent every day How long do the headaches last?  All day Verify what preventative medication and dose you are taking (e.g. topiramate , propranolol, amitriptyline, Emgality, etc)  Aimovig  140 mg- given by another provider. ( Ineffective) last taken Novemeber 2025. Now can not afford it Verify which rescue medication you are taking (triptan, Advil , Excedrin, Aleve, Ubrelvy, etc)  Rizatriptan ( Ineffective) Tylenol  every 6 hours every ( Ineffective How often are you taking pain relievers/analgesics/rescue mediction?   Every 6 hours    Patient follow up 06/2024

## 2024-05-09 NOTE — Telephone Encounter (Signed)
 Patient advised of Dr.Jaffe note. It appears that since her last appointment with me, Dr. Babs has been managing her migraines and has made changes in that management. She will have to address this with him until she can follow up with me.

## 2024-05-14 ENCOUNTER — Encounter: Attending: Physical Medicine & Rehabilitation | Admitting: Psychology

## 2024-06-10 ENCOUNTER — Ambulatory Visit: Admitting: Neurology

## 2024-06-12 ENCOUNTER — Ambulatory Visit: Admitting: Neurology

## 2024-06-19 ENCOUNTER — Encounter: Admitting: Physical Medicine & Rehabilitation
